# Patient Record
Sex: Male | Born: 1960 | Race: Black or African American | Hispanic: No | Marital: Single | Smoking: Never smoker
Health system: Southern US, Community
[De-identification: ages and names within clinical notes are randomized; demographics above are authoritative.]

## PROBLEM LIST (undated history)

## (undated) ENCOUNTER — Telehealth

## (undated) ENCOUNTER — Ambulatory Visit

## (undated) ENCOUNTER — Encounter: Attending: Nephrology | Primary: Nephrology

## (undated) ENCOUNTER — Encounter

## (undated) ENCOUNTER — Ambulatory Visit: Payer: Medicare (Managed Care) | Attending: Medical | Primary: Medical

## (undated) ENCOUNTER — Ambulatory Visit: Payer: PRIVATE HEALTH INSURANCE

## (undated) ENCOUNTER — Inpatient Hospital Stay

## (undated) ENCOUNTER — Ambulatory Visit: Payer: PRIVATE HEALTH INSURANCE | Attending: Adult Health | Primary: Adult Health

## (undated) ENCOUNTER — Encounter
Attending: Student in an Organized Health Care Education/Training Program | Primary: Student in an Organized Health Care Education/Training Program

## (undated) ENCOUNTER — Ambulatory Visit: Attending: Nephrology | Primary: Nephrology

## (undated) ENCOUNTER — Telehealth: Attending: Nephrology | Primary: Nephrology

## (undated) ENCOUNTER — Telehealth: Attending: Internal Medicine | Primary: Internal Medicine

## (undated) ENCOUNTER — Encounter: Payer: MEDICARE | Attending: Gastroenterology | Primary: Gastroenterology

## (undated) ENCOUNTER — Encounter: Payer: PRIVATE HEALTH INSURANCE | Attending: Medical | Primary: Medical

## (undated) ENCOUNTER — Ambulatory Visit: Payer: MEDICARE

## (undated) ENCOUNTER — Ambulatory Visit: Payer: MEDICARE | Attending: Nephrology | Primary: Nephrology

## (undated) ENCOUNTER — Encounter: Payer: MEDICARE | Attending: Adult Health | Primary: Adult Health

## (undated) ENCOUNTER — Encounter: Attending: Family Medicine | Primary: Family Medicine

## (undated) ENCOUNTER — Ambulatory Visit
Payer: MEDICARE | Attending: Student in an Organized Health Care Education/Training Program | Primary: Student in an Organized Health Care Education/Training Program

## (undated) ENCOUNTER — Telehealth: Attending: Family | Primary: Family

## (undated) ENCOUNTER — Encounter: Payer: MEDICARE | Attending: Family | Primary: Family

## (undated) DIAGNOSIS — Z9109 Other allergy status, other than to drugs and biological substances: Secondary | ICD-10-CM

## (undated) DIAGNOSIS — I10 Essential (primary) hypertension: Secondary | ICD-10-CM

## (undated) DIAGNOSIS — N186 End stage renal disease: Secondary | ICD-10-CM

## (undated) DIAGNOSIS — B353 Tinea pedis: Secondary | ICD-10-CM

## (undated) DIAGNOSIS — N189 Chronic kidney disease, unspecified: Secondary | ICD-10-CM

## (undated) DIAGNOSIS — L219 Seborrheic dermatitis, unspecified: Secondary | ICD-10-CM

## (undated) DIAGNOSIS — M109 Gout, unspecified: Secondary | ICD-10-CM

## (undated) DIAGNOSIS — B20 Human immunodeficiency virus [HIV] disease: Secondary | ICD-10-CM

## (undated) HISTORY — DX: End stage renal disease: N18.6

## (undated) HISTORY — DX: Human immunodeficiency virus (HIV) disease: B20

## (undated) HISTORY — PX: HERNIA REPAIR: SHX51

## (undated) HISTORY — DX: Seborrheic dermatitis, unspecified: L21.9

## (undated) HISTORY — DX: Tinea pedis: B35.3

## (undated) SURGERY — DIALYSIS/PERMA CATHETER INSERTION
Anesthesia: Moderate Sedation | Laterality: Right

## (undated) MED ORDER — HYDRALAZINE 50 MG TABLET: Freq: Three times a day (TID) | ORAL | 0.00000 days | Status: SS

## (undated) MED ORDER — SPIRONOLACTONE 25 MG TABLET: Freq: Every day | ORAL | 0 days | Status: SS

## (undated) MED ORDER — ATENOLOL 100 MG TABLET: Freq: Every day | ORAL | 0 days | Status: SS

## (undated) MED ORDER — DOXAZOSIN 2 MG TABLET: Freq: Two times a day (BID) | ORAL | 0 days | Status: SS

---

## 1995-12-15 DIAGNOSIS — B20 Human immunodeficiency virus [HIV] disease: Secondary | ICD-10-CM

## 1995-12-15 DIAGNOSIS — Z21 Asymptomatic human immunodeficiency virus [HIV] infection status: Secondary | ICD-10-CM

## 1995-12-15 HISTORY — DX: Asymptomatic human immunodeficiency virus (hiv) infection status: Z21

## 1995-12-15 HISTORY — DX: Human immunodeficiency virus (HIV) disease: B20

## 1996-01-02 ENCOUNTER — Encounter (INDEPENDENT_AMBULATORY_CARE_PROVIDER_SITE_OTHER): Payer: Self-pay | Admitting: *Deleted

## 1996-01-02 DIAGNOSIS — B20 Human immunodeficiency virus [HIV] disease: Secondary | ICD-10-CM | POA: Insufficient documentation

## 1996-01-02 LAB — CONVERTED CEMR LAB
CD4 Count: 460 microliters
CD4 T Cell Abs: 460

## 1998-01-06 ENCOUNTER — Encounter: Admission: RE | Admit: 1998-01-06 | Discharge: 1998-01-06 | Payer: Self-pay | Admitting: Infectious Diseases

## 1998-02-03 ENCOUNTER — Encounter: Admission: RE | Admit: 1998-02-03 | Discharge: 1998-02-03 | Payer: Self-pay | Admitting: Infectious Diseases

## 2002-09-02 ENCOUNTER — Encounter: Admission: RE | Admit: 2002-09-02 | Discharge: 2002-09-02 | Payer: Self-pay | Admitting: Infectious Diseases

## 2002-09-02 ENCOUNTER — Ambulatory Visit (HOSPITAL_COMMUNITY): Admission: RE | Admit: 2002-09-02 | Discharge: 2002-09-02 | Payer: Self-pay | Admitting: Infectious Diseases

## 2002-09-22 ENCOUNTER — Encounter: Admission: RE | Admit: 2002-09-22 | Discharge: 2002-09-22 | Payer: Self-pay | Admitting: Infectious Diseases

## 2004-05-25 ENCOUNTER — Ambulatory Visit: Payer: Self-pay | Admitting: Infectious Diseases

## 2004-05-25 ENCOUNTER — Encounter (INDEPENDENT_AMBULATORY_CARE_PROVIDER_SITE_OTHER): Payer: Self-pay | Admitting: *Deleted

## 2004-05-25 ENCOUNTER — Ambulatory Visit (HOSPITAL_COMMUNITY): Admission: RE | Admit: 2004-05-25 | Discharge: 2004-05-25 | Payer: Self-pay | Admitting: Infectious Diseases

## 2004-05-25 LAB — CONVERTED CEMR LAB
CD4 Count: 250 microliters
HIV 1 RNA Quant: 1960 copies/mL

## 2004-06-22 ENCOUNTER — Ambulatory Visit: Payer: Self-pay | Admitting: Infectious Diseases

## 2004-06-23 DIAGNOSIS — L219 Seborrheic dermatitis, unspecified: Secondary | ICD-10-CM

## 2004-06-23 DIAGNOSIS — B353 Tinea pedis: Secondary | ICD-10-CM

## 2004-06-23 HISTORY — DX: Tinea pedis: B35.3

## 2004-06-23 HISTORY — DX: Seborrheic dermatitis, unspecified: L21.9

## 2005-04-26 ENCOUNTER — Encounter (INDEPENDENT_AMBULATORY_CARE_PROVIDER_SITE_OTHER): Payer: Self-pay | Admitting: *Deleted

## 2005-04-26 ENCOUNTER — Encounter: Admission: RE | Admit: 2005-04-26 | Discharge: 2005-04-26 | Payer: Self-pay | Admitting: Infectious Diseases

## 2005-04-26 ENCOUNTER — Ambulatory Visit: Payer: Self-pay | Admitting: Infectious Diseases

## 2005-04-26 LAB — CONVERTED CEMR LAB
CD4 Count: 370 microliters
HIV 1 RNA Quant: 5020 copies/mL

## 2006-04-09 ENCOUNTER — Encounter (INDEPENDENT_AMBULATORY_CARE_PROVIDER_SITE_OTHER): Payer: Self-pay | Admitting: *Deleted

## 2006-04-09 LAB — CONVERTED CEMR LAB

## 2006-04-22 ENCOUNTER — Encounter (INDEPENDENT_AMBULATORY_CARE_PROVIDER_SITE_OTHER): Payer: Self-pay | Admitting: *Deleted

## 2007-03-05 ENCOUNTER — Other Ambulatory Visit: Payer: Self-pay

## 2007-03-05 ENCOUNTER — Ambulatory Visit: Payer: Self-pay | Admitting: General Surgery

## 2007-03-08 ENCOUNTER — Ambulatory Visit: Payer: Self-pay | Admitting: General Surgery

## 2008-05-11 ENCOUNTER — Ambulatory Visit: Payer: Self-pay | Admitting: Family Medicine

## 2010-03-21 ENCOUNTER — Ambulatory Visit: Payer: Self-pay | Admitting: Family Medicine

## 2010-03-31 ENCOUNTER — Ambulatory Visit: Payer: Self-pay | Admitting: Family Medicine

## 2010-04-25 ENCOUNTER — Ambulatory Visit: Payer: Self-pay | Admitting: Anesthesiology

## 2010-04-25 DIAGNOSIS — I10 Essential (primary) hypertension: Secondary | ICD-10-CM

## 2010-04-26 ENCOUNTER — Ambulatory Visit: Payer: Self-pay | Admitting: General Surgery

## 2010-04-27 LAB — PATHOLOGY REPORT

## 2010-05-26 DIAGNOSIS — B20 Human immunodeficiency virus [HIV] disease: Secondary | ICD-10-CM

## 2010-05-26 DIAGNOSIS — Z0000 Encounter for general adult medical examination without abnormal findings: Secondary | ICD-10-CM | POA: Insufficient documentation

## 2010-06-09 ENCOUNTER — Other Ambulatory Visit (INDEPENDENT_AMBULATORY_CARE_PROVIDER_SITE_OTHER): Payer: BC Managed Care – PPO

## 2010-06-09 DIAGNOSIS — Z79899 Other long term (current) drug therapy: Secondary | ICD-10-CM

## 2010-06-09 DIAGNOSIS — Z0000 Encounter for general adult medical examination without abnormal findings: Secondary | ICD-10-CM

## 2010-06-09 DIAGNOSIS — B20 Human immunodeficiency virus [HIV] disease: Secondary | ICD-10-CM

## 2010-06-09 DIAGNOSIS — Z113 Encounter for screening for infections with a predominantly sexual mode of transmission: Secondary | ICD-10-CM | POA: Insufficient documentation

## 2010-06-09 LAB — CBC WITH DIFFERENTIAL/PLATELET
Basophils Absolute: 0 10*3/uL (ref 0.0–0.1)
Basophils Relative: 0 % (ref 0–1)
Eosinophils Absolute: 0.1 10*3/uL (ref 0.0–0.7)
Eosinophils Relative: 1 % (ref 0–5)
HCT: 37.9 % — ABNORMAL LOW (ref 39.0–52.0)
Hemoglobin: 12.9 g/dL — ABNORMAL LOW (ref 13.0–17.0)
Lymphocytes Relative: 35 % (ref 12–46)
Lymphs Abs: 1.7 10*3/uL (ref 0.7–4.0)
MCH: 31.9 pg (ref 26.0–34.0)
MCHC: 34 g/dL (ref 30.0–36.0)
MCV: 93.6 fL (ref 78.0–100.0)
Monocytes Absolute: 0.6 10*3/uL (ref 0.1–1.0)
Monocytes Relative: 11 % (ref 3–12)
Neutro Abs: 2.5 10*3/uL (ref 1.7–7.7)
Neutrophils Relative %: 52 % (ref 43–77)
Platelets: 285 10*3/uL (ref 150–400)
RBC: 4.05 MIL/uL — ABNORMAL LOW (ref 4.22–5.81)
RDW: 16.1 % — ABNORMAL HIGH (ref 11.5–15.5)
WBC: 4.9 10*3/uL (ref 4.0–10.5)

## 2010-06-09 LAB — RPR

## 2010-06-09 LAB — COMPREHENSIVE METABOLIC PANEL
ALT: 13 U/L (ref 0–53)
AST: 29 U/L (ref 0–37)
Albumin: 3.9 g/dL (ref 3.5–5.2)
Alkaline Phosphatase: 68 U/L (ref 39–117)
BUN: 21 mg/dL (ref 6–23)
CO2: 25 mEq/L (ref 19–32)
Calcium: 9.6 mg/dL (ref 8.4–10.5)
Chloride: 104 mEq/L (ref 96–112)
Creat: 1.18 mg/dL (ref 0.40–1.50)
Glucose, Bld: 84 mg/dL (ref 70–99)
Potassium: 3.9 mEq/L (ref 3.5–5.3)
Sodium: 139 mEq/L (ref 135–145)
Total Bilirubin: 0.4 mg/dL (ref 0.3–1.2)
Total Protein: 8.6 g/dL — ABNORMAL HIGH (ref 6.0–8.3)

## 2010-06-09 LAB — LIPID PANEL
Cholesterol: 186 mg/dL (ref 0–200)
HDL: 55 mg/dL (ref >39)
LDL Cholesterol: 117 mg/dL — ABNORMAL HIGH (ref 0–99)
Total CHOL/HDL Ratio: 3.4 Ratio
Triglycerides: 69 mg/dL (ref <150)
VLDL: 14 mg/dL (ref 0–40)

## 2010-06-09 NOTE — Progress Notes (Signed)
Addended by: Lorne Skeens on: 06/09/2010 10:59 AM   Modules accepted: Orders

## 2010-06-10 LAB — URINALYSIS, ROUTINE W REFLEX MICROSCOPIC
Bilirubin Urine: NEGATIVE
Glucose, UA: NEGATIVE mg/dL
Ketones, ur: NEGATIVE mg/dL
Leukocytes, UA: NEGATIVE
Nitrite: NEGATIVE
Protein, ur: 30 mg/dL — AB
Specific Gravity, Urine: 1.017 (ref 1.005–1.030)
Urobilinogen, UA: 0.2 mg/dL (ref 0.0–1.0)
pH: 5 (ref 5.0–8.0)

## 2010-06-10 LAB — URINALYSIS, MICROSCOPIC ONLY
Bacteria, UA: NONE SEEN
Casts: NONE SEEN
Crystals: NONE SEEN
Squamous Epithelial / LPF: NONE SEEN

## 2010-06-10 LAB — GC/CHLAMYDIA PROBE AMP, URINE
Chlamydia, Swab/Urine, PCR: NEGATIVE
GC Probe Amp, Urine: NEGATIVE

## 2010-06-10 LAB — T-HELPER CELL (CD4) - (RCID CLINIC ONLY)
CD4 % Helper T Cell: 24 % — ABNORMAL LOW (ref 33–55)
CD4 T Cell Abs: 410 uL (ref 400–2700)

## 2010-06-15 LAB — HIV-1 RNA ULTRAQUANT REFLEX TO GENTYP+
HIV 1 RNA Quant: 3710 copies/mL — ABNORMAL HIGH (ref <20)
HIV-1 RNA Quant, Log: 3.57 {Log} — ABNORMAL HIGH (ref <1.30)

## 2010-06-20 LAB — HIV-1 GENOTYPR PLUS

## 2010-06-23 ENCOUNTER — Ambulatory Visit (INDEPENDENT_AMBULATORY_CARE_PROVIDER_SITE_OTHER): Payer: BC Managed Care – PPO | Admitting: Adult Health

## 2010-06-23 ENCOUNTER — Encounter: Payer: Self-pay | Admitting: Adult Health

## 2010-06-23 DIAGNOSIS — B20 Human immunodeficiency virus [HIV] disease: Secondary | ICD-10-CM

## 2010-06-23 DIAGNOSIS — L719 Rosacea, unspecified: Secondary | ICD-10-CM

## 2010-06-23 DIAGNOSIS — I10 Essential (primary) hypertension: Secondary | ICD-10-CM

## 2010-06-23 DIAGNOSIS — I120 Hypertensive chronic kidney disease with stage 5 chronic kidney disease or end stage renal disease: Secondary | ICD-10-CM | POA: Insufficient documentation

## 2010-06-23 NOTE — Progress Notes (Signed)
Subjective:    Patient ID: Louis [name], male    DOB: 04-22-1960, 50 y.o.   MRN: [id number]  HPI 50 year old, African American male presents to clinic for evaluation and management. Not seen in clinic since 2007. He has a history of stable HIV, with a low viral load and has been treatment nave since his original diagnosis. His most recent health problems involve hypertension, for which he is on 2 blood pressure medications and rosacea to his face, for which he takes doxycycline, as needed. He does not recall at medication or the dosages of his antihypertensives, who states he will call us with those when he gets on. He denies any treatment for HIV-associated problems, and he has not seen a. Medical provider for his HIV since 2007. He has had a right inguinal hernia repair in March 0000000, with no complications. He denies any constitutional symptoms and states he has been in his normal state of good health.    Review of Systems  Constitutional: Negative for fever, chills, diaphoresis, activity change, appetite change, fatigue and unexpected weight change.  HENT: Negative for hearing loss, ear pain, nosebleeds, congestion, facial swelling, rhinorrhea, sneezing, neck pain, neck stiffness, postnasal drip, tinnitus and ear discharge.   Eyes: Negative for photophobia, pain, discharge, redness, itching and visual disturbance.  Respiratory: Negative for apnea, cough, choking, chest tightness, shortness of breath, wheezing and stridor.   Cardiovascular: Negative for chest pain, palpitations and leg swelling.  Gastrointestinal: Negative for nausea, vomiting, abdominal pain, diarrhea, constipation, blood in stool, abdominal distention, anal bleeding and rectal pain.  Genitourinary: Negative for dysuria, urgency, frequency, hematuria, flank pain, decreased urine volume, discharge, penile swelling, scrotal swelling, enuresis, difficulty urinating, genital sores, penile pain and testicular pain.    Musculoskeletal: Negative for myalgias, back pain, joint swelling, arthralgias and gait problem.  Skin: Negative for color change, pallor, rash and wound.  Neurological: Negative for dizziness, tremors, seizures, syncope, facial asymmetry, speech difficulty, weakness, light-headedness, numbness and headaches.  Hematological: Negative for adenopathy. Does not bruise/bleed easily.  Psychiatric/Behavioral: Negative for suicidal ideas, hallucinations, behavioral problems, confusion, sleep disturbance, self-injury, dysphoric mood, decreased concentration and agitation. The patient is not nervous/anxious and is not hyperactive.        Objective:   Physical Exam  Constitutional: He is oriented to person, place, and time. He appears well-developed and well-nourished. No distress.  HENT:  Head: Normocephalic and atraumatic.  Right Ear: External ear normal.  Left Ear: External ear normal.  Nose: Nose normal.  Mouth/Throat: Oropharynx is clear and moist. No oropharyngeal exudate.  Eyes: Conjunctivae and EOM are normal. Pupils are equal, round, and reactive to light. Right eye exhibits no discharge. Left eye exhibits no discharge. No scleral icterus.  Neck: Normal range of motion. Neck supple. No JVD present. No tracheal deviation present. No thyromegaly present.  Cardiovascular: Normal rate, regular rhythm, normal heart sounds and intact distal pulses.  Exam reveals no gallop and no friction rub.   No murmur heard. Pulmonary/Chest: Effort normal and breath sounds normal. No stridor. No respiratory distress. He has no wheezes. He has no rales. He exhibits no tenderness.  Abdominal: Soft. Bowel sounds are normal. He exhibits no distension and no mass. There is no tenderness. There is no rebound and no guarding.       Postoperative her surgical incision sites to both left and right inguinal regions intact and healed.  Genitourinary: Penis normal.  Musculoskeletal: Normal range of motion. He exhibits no  edema and no tenderness.  Lymphadenopathy:  He has no cervical adenopathy.  Neurological: He is alert and oriented to person, place, and time. No cranial nerve deficit. He exhibits normal muscle tone. Coordination normal.  Skin: Skin is warm and dry. Rash noted. There is erythema. No pallor.       Rosacea-like rash noted to face, in a butterfly pattern around the nasal labial folds, and bilateral cheeks.  Psychiatric: His behavior is normal. Judgment and thought content normal.       Affect is blunted          Assessment & Plan:  1. HIV. His CD4 count from 06/09/2010 was 410 at 48% with a viral load of 3710 copies per mL. We spent an extensive period of time discussing HIV pathogenesis, treatment strategies, medication effects, regimen, options, and treatment goals. We discussed in detail 3 specific regimens, which include Atripla or Complera or Isentress with Truvada. Drug effects, regimen, limitations, side effects, and potential adverse events, and toxicities were discussed in detail. Questions were answered with verbal acknowledgment of all information provided. At present, he is requesting "a little time"to prepare himself for treatment. He did state that when he is ready to start treatment, he will use Complera as his treatment choice. He states he will call us back by the first of next week to let us know when he is ready. Our plan will be to schedule labs in 4 weeks after he starts his treatment, and followup with [place] in 6 weeks.  2. Hypertension. States he only took one of his antihypertensives today and knows he needs to have his other medication on board as well. He also states he will call us with the names and doses of the medications. He is taking. We recommend that should his blood pressure, not be under control. He followup with his primary care doctor for medication modification. Verbally acknowledged this and agreed with plan.

## 2011-01-12 ENCOUNTER — Encounter: Payer: Self-pay | Admitting: *Deleted

## 2011-07-18 ENCOUNTER — Telehealth: Payer: Self-pay | Admitting: *Deleted

## 2011-07-18 NOTE — Telephone Encounter (Signed)
I spoke with him about the need to be seen. I transferred him to [facility] to make an appt

## 2011-08-08 ENCOUNTER — Other Ambulatory Visit: Payer: Self-pay | Admitting: Internal Medicine

## 2011-08-08 DIAGNOSIS — Z79899 Other long term (current) drug therapy: Secondary | ICD-10-CM

## 2011-08-08 DIAGNOSIS — B20 Human immunodeficiency virus [HIV] disease: Secondary | ICD-10-CM

## 2011-08-08 DIAGNOSIS — Z113 Encounter for screening for infections with a predominantly sexual mode of transmission: Secondary | ICD-10-CM

## 2011-08-09 ENCOUNTER — Other Ambulatory Visit (INDEPENDENT_AMBULATORY_CARE_PROVIDER_SITE_OTHER): Payer: BC Managed Care – PPO

## 2011-08-09 DIAGNOSIS — Z79899 Other long term (current) drug therapy: Secondary | ICD-10-CM

## 2011-08-09 DIAGNOSIS — B20 Human immunodeficiency virus [HIV] disease: Secondary | ICD-10-CM

## 2011-08-09 DIAGNOSIS — Z113 Encounter for screening for infections with a predominantly sexual mode of transmission: Secondary | ICD-10-CM

## 2011-08-10 LAB — CBC WITH DIFFERENTIAL/PLATELET
Basophils Absolute: 0 10*3/uL (ref 0.0–0.1)
Basophils Relative: 0 % (ref 0–1)
Eosinophils Absolute: 0.1 10*3/uL (ref 0.0–0.7)
Eosinophils Relative: 1 % (ref 0–5)
HCT: 36.9 % — ABNORMAL LOW (ref 39.0–52.0)
Hemoglobin: 12.2 g/dL — ABNORMAL LOW (ref 13.0–17.0)
Lymphocytes Relative: 22 % (ref 12–46)
Lymphs Abs: 1.1 10*3/uL (ref 0.7–4.0)
MCH: 34.3 pg — ABNORMAL HIGH (ref 26.0–34.0)
MCHC: 33.1 g/dL (ref 30.0–36.0)
MCV: 103.7 fL — ABNORMAL HIGH (ref 78.0–100.0)
Monocytes Absolute: 0.7 10*3/uL (ref 0.1–1.0)
Monocytes Relative: 15 % — ABNORMAL HIGH (ref 3–12)
Neutro Abs: 3.1 10*3/uL (ref 1.7–7.7)
Neutrophils Relative %: 62 % (ref 43–77)
Platelets: 235 10*3/uL (ref 150–400)
RBC: 3.56 MIL/uL — ABNORMAL LOW (ref 4.22–5.81)
RDW: 15.1 % (ref 11.5–15.5)
WBC: 5 10*3/uL (ref 4.0–10.5)

## 2011-08-10 LAB — COMPREHENSIVE METABOLIC PANEL
ALT: 40 U/L (ref 0–53)
AST: 62 U/L — ABNORMAL HIGH (ref 0–37)
Albumin: 3.7 g/dL (ref 3.5–5.2)
Alkaline Phosphatase: 60 U/L (ref 39–117)
BUN: 19 mg/dL (ref 6–23)
CO2: 26 mEq/L (ref 19–32)
Calcium: 9.2 mg/dL (ref 8.4–10.5)
Chloride: 107 mEq/L (ref 96–112)
Creat: 1.4 mg/dL — ABNORMAL HIGH (ref 0.50–1.35)
Glucose, Bld: 89 mg/dL (ref 70–99)
Potassium: 5 mEq/L (ref 3.5–5.3)
Sodium: 141 mEq/L (ref 135–145)
Total Bilirubin: 0.4 mg/dL (ref 0.3–1.2)
Total Protein: 8.2 g/dL (ref 6.0–8.3)

## 2011-08-10 LAB — LIPID PANEL
Cholesterol: 179 mg/dL (ref 0–200)
HDL: 54 mg/dL (ref >39)
LDL Cholesterol: 107 mg/dL — ABNORMAL HIGH (ref 0–99)
Total CHOL/HDL Ratio: 3.3 Ratio
Triglycerides: 89 mg/dL (ref <150)
VLDL: 18 mg/dL (ref 0–40)

## 2011-08-10 LAB — T-HELPER CELL (CD4) - (RCID CLINIC ONLY)
CD4 % Helper T Cell: 25 % — ABNORMAL LOW (ref 33–55)
CD4 T Cell Abs: 330 uL — ABNORMAL LOW (ref 400–2700)

## 2011-08-10 LAB — RPR

## 2011-08-11 LAB — HIV-1 RNA QUANT-NO REFLEX-BLD
HIV 1 RNA Quant: 15109 copies/mL — ABNORMAL HIGH (ref <20)
HIV-1 RNA Quant, Log: 4.18 {Log} — ABNORMAL HIGH (ref <1.30)

## 2011-08-23 ENCOUNTER — Ambulatory Visit: Payer: BC Managed Care – PPO | Admitting: Internal Medicine

## 2011-09-11 ENCOUNTER — Ambulatory Visit (INDEPENDENT_AMBULATORY_CARE_PROVIDER_SITE_OTHER): Payer: BC Managed Care – PPO | Admitting: Internal Medicine

## 2011-09-11 DIAGNOSIS — B20 Human immunodeficiency virus [HIV] disease: Secondary | ICD-10-CM

## 2011-11-13 NOTE — Assessment & Plan Note (Signed)
No show

## 2011-11-13 NOTE — Progress Notes (Signed)
Patient ID: Louis [name], male   DOB: 1960-03-24, 51 y.o.   MRN: [id number]  No show

## 2012-01-03 ENCOUNTER — Other Ambulatory Visit: Payer: BC Managed Care – PPO

## 2012-01-03 DIAGNOSIS — B20 Human immunodeficiency virus [HIV] disease: Secondary | ICD-10-CM

## 2012-01-04 LAB — COMPLETE METABOLIC PANEL WITH GFR
ALT: 21 U/L (ref 0–53)
AST: 50 U/L — ABNORMAL HIGH (ref 0–37)
Albumin: 3.7 g/dL (ref 3.5–5.2)
Alkaline Phosphatase: 74 U/L (ref 39–117)
BUN: 23 mg/dL (ref 6–23)
CO2: 25 mEq/L (ref 19–32)
Calcium: 8.7 mg/dL (ref 8.4–10.5)
Chloride: 108 mEq/L (ref 96–112)
Creat: 1.5 mg/dL — ABNORMAL HIGH (ref 0.50–1.35)
GFR, Est African American: 62 mL/min
GFR, Est Non African American: 54 mL/min — ABNORMAL LOW
Glucose, Bld: 88 mg/dL (ref 70–99)
Potassium: 4.7 mEq/L (ref 3.5–5.3)
Sodium: 142 mEq/L (ref 135–145)
Total Bilirubin: 0.7 mg/dL (ref 0.3–1.2)
Total Protein: 8.1 g/dL (ref 6.0–8.3)

## 2012-01-04 LAB — CBC WITH DIFFERENTIAL/PLATELET
Basophils Absolute: 0 10*3/uL (ref 0.0–0.1)
Basophils Relative: 0 % (ref 0–1)
Eosinophils Absolute: 0 10*3/uL (ref 0.0–0.7)
Eosinophils Relative: 1 % (ref 0–5)
HCT: 37.6 % — ABNORMAL LOW (ref 39.0–52.0)
Hemoglobin: 12.5 g/dL — ABNORMAL LOW (ref 13.0–17.0)
Lymphocytes Relative: 25 % (ref 12–46)
Lymphs Abs: 1.4 10*3/uL (ref 0.7–4.0)
MCH: 35 pg — ABNORMAL HIGH (ref 26.0–34.0)
MCHC: 33.2 g/dL (ref 30.0–36.0)
MCV: 105.3 fL — ABNORMAL HIGH (ref 78.0–100.0)
Monocytes Absolute: 0.5 10*3/uL (ref 0.1–1.0)
Monocytes Relative: 9 % (ref 3–12)
Neutro Abs: 3.5 10*3/uL (ref 1.7–7.7)
Neutrophils Relative %: 65 % (ref 43–77)
Platelets: 256 10*3/uL (ref 150–400)
RBC: 3.57 MIL/uL — ABNORMAL LOW (ref 4.22–5.81)
RDW: 14 % (ref 11.5–15.5)
WBC: 5.4 10*3/uL (ref 4.0–10.5)

## 2012-01-04 LAB — HIV-1 RNA QUANT-NO REFLEX-BLD
HIV 1 RNA Quant: 15522 copies/mL — ABNORMAL HIGH (ref <20)
HIV-1 RNA Quant, Log: 4.19 {Log} — ABNORMAL HIGH (ref <1.30)

## 2012-01-04 LAB — T-HELPER CELL (CD4) - (RCID CLINIC ONLY)
CD4 % Helper T Cell: 19 % — ABNORMAL LOW (ref 33–55)
CD4 T Cell Abs: 230 uL — ABNORMAL LOW (ref 400–2700)

## 2012-01-17 ENCOUNTER — Ambulatory Visit: Payer: BC Managed Care – PPO | Admitting: Internal Medicine

## 2012-01-17 ENCOUNTER — Telehealth: Payer: Self-pay | Admitting: *Deleted

## 2012-01-17 NOTE — Telephone Encounter (Signed)
RN called patient to remind of appointment this afternoon. He apologized for missing it, saying that he had been at the doctor this morning for a broken nose.  He rescheduled for 12/10 at 3:45pm Louis [name]

## 2012-01-23 ENCOUNTER — Other Ambulatory Visit: Payer: Self-pay | Admitting: *Deleted

## 2012-01-23 ENCOUNTER — Encounter: Payer: Self-pay | Admitting: Internal Medicine

## 2012-01-23 ENCOUNTER — Ambulatory Visit (INDEPENDENT_AMBULATORY_CARE_PROVIDER_SITE_OTHER): Payer: BC Managed Care – PPO | Admitting: Internal Medicine

## 2012-01-23 VITALS — BP 148/88 | HR 85 | Temp 97.7°F | Ht 74.5 in | Wt 213.0 lb

## 2012-01-23 DIAGNOSIS — B20 Human immunodeficiency virus [HIV] disease: Secondary | ICD-10-CM

## 2012-01-23 MED ORDER — ABACAVIR SULFATE-LAMIVUDINE 600-300 MG PO TABS: 1.0000 | ORAL_TABLET | Freq: Every day | ORAL | Status: DC

## 2012-01-23 MED ORDER — DARUNAVIR ETHANOLATE 800 MG PO TABS: 800.0000 mg | ORAL_TABLET | Freq: Every day | ORAL | Status: DC

## 2012-01-23 MED ORDER — RITONAVIR 100 MG PO TABS: 100.0000 mg | ORAL_TABLET | Freq: Every day | ORAL | Status: DC

## 2012-01-23 NOTE — Assessment & Plan Note (Signed)
Because of his renal insufficiency, I have discussed with him and opted to start him on Epzicom with Prezista and Norvir. He is going to start this now and return in one month for labs and I will see him 2 weeks after his labs. I also going to get an HLA test today since this has not been done in the past to assure no risk of hypersensitivity reaction. If this is positive, he will need to stop. However I opts not to wait to start his medications since he has had such poor followup and his CD4 is down to 230.

## 2012-01-23 NOTE — Progress Notes (Signed)
  Subjective:    Patient ID: Louis [name], male    DOB: Oct 09, 1960, 51 y.o.   MRN: [id number]  HPI He comes in for consideration of therapy for his HIV. He has very sporadic followup and in fact was last seen about 2 years ago and prior to that was about 5 years ago. He has had discussions about starting medication however he tends to not return. A however, he feels he is ready to start medication and understands the importance of medication. He does have high blood pressure and unfortunately his creatinine has been noted to be elevated on several occasions. He has no complaints though including no urinary discomfort or issues. He feels well otherwise. He is concerned about side effects of medications.   Review of Systems  Constitutional: Negative for fever and fatigue.  HENT: Negative for sore throat and trouble swallowing.   Respiratory: Negative for cough and shortness of breath.   Cardiovascular: Negative for chest pain, palpitations and leg swelling.  Gastrointestinal: Negative for nausea, abdominal pain and diarrhea.  Musculoskeletal: Negative for myalgias, joint swelling and arthralgias.  Skin:       Rosacea on his face  Neurological: Negative for dizziness and headaches.       Objective:   Physical Exam  Constitutional: He appears well-developed and well-nourished. No distress.  Cardiovascular: Normal rate, regular rhythm and normal heart sounds.  Exam reveals no gallop and no friction rub.   No murmur heard. Pulmonary/Chest: Effort normal and breath sounds normal. No respiratory distress. He has no wheezes. He has no rales.  Lymphadenopathy:    He has no cervical adenopathy.  Skin: Rash noted.       Notable rosacea on his face          Assessment & Plan:

## 2012-01-24 ENCOUNTER — Other Ambulatory Visit: Payer: Self-pay | Admitting: *Deleted

## 2012-01-24 DIAGNOSIS — B20 Human immunodeficiency virus [HIV] disease: Secondary | ICD-10-CM

## 2012-01-24 MED ORDER — RITONAVIR 100 MG PO TABS: 100.0000 mg | ORAL_TABLET | Freq: Every day | ORAL | Status: DC

## 2012-01-24 MED ORDER — ABACAVIR SULFATE-LAMIVUDINE 600-300 MG PO TABS: 1.0000 | ORAL_TABLET | Freq: Every day | ORAL | Status: DC

## 2012-01-24 MED ORDER — DARUNAVIR ETHANOLATE 800 MG PO TABS: 800.0000 mg | ORAL_TABLET | Freq: Every day | ORAL | Status: DC

## 2012-01-24 NOTE — Telephone Encounter (Signed)
Patient called and advised he was not able to get his medications filled. He advised we sent them to [facility] in [place] and was to go to [facility]. Advised him that should not have been an issue as if they were in the system they could have [facility]. Called the pharmacy and was advised that they were not able to fill the medications because his insurance company requires him to have them mail-order. She further advised they are not able to transfer them to the mail-order, we will have to send them. Advised to send to [place] and gave [phone] as the contact number to give the patient when he calls to set up delivery.   Called patient gave him the information and contact number. Advised him to call and activate the co-pay cards and call and give them the information and to set up delivery. Advised him that if he has any other questions or problems to call the office.

## 2012-01-31 LAB — HLA B*5701: HLA-B*5701: NEGATIVE

## 2012-02-09 ENCOUNTER — Other Ambulatory Visit: Payer: Self-pay | Admitting: *Deleted

## 2012-02-19 ENCOUNTER — Telehealth: Payer: Self-pay | Admitting: *Deleted

## 2012-02-19 NOTE — Telephone Encounter (Signed)
Patient is having difficulty obtaining his HIV meds. Called [facility] [phone] and there is a problem with the copay cards. They advised him to call and request to speak with an insurance specialist. I told him if there is still an issue to call in the AM and make an appt with Jasmine December our medication assistance person and she can activate the cards for him. Myrtis Hopping CMA

## 2012-02-22 ENCOUNTER — Telehealth: Payer: Self-pay | Admitting: Internal Medicine

## 2012-02-22 NOTE — Telephone Encounter (Signed)
Returned Mr. Nolting call.  He still has not gotten his medication.  I asked if he had activated his co-pay cards.  He said he had and the only one he could use was for the Epzicom.  I called the different co-pay companies.  The only one activated was for Epzicom.  I activated the cards for Prezista and Norvir.  I called [facility] and gave them the numbers on the cards.  If everything is ok, Mr. Catania should get his medication by Monday.

## 2012-02-27 ENCOUNTER — Ambulatory Visit: Payer: Self-pay | Admitting: Family Medicine

## 2012-03-06 ENCOUNTER — Telehealth: Payer: Self-pay | Admitting: Licensed Clinical Social Worker

## 2012-03-06 ENCOUNTER — Other Ambulatory Visit: Payer: Self-pay | Admitting: Licensed Clinical Social Worker

## 2012-03-06 DIAGNOSIS — B20 Human immunodeficiency virus [HIV] disease: Secondary | ICD-10-CM

## 2012-03-06 MED ORDER — DARUNAVIR ETHANOLATE 800 MG PO TABS: 800.0000 mg | ORAL_TABLET | Freq: Every day | ORAL | Status: DC

## 2012-03-06 MED ORDER — ABACAVIR SULFATE-LAMIVUDINE 600-300 MG PO TABS: 1.0000 | ORAL_TABLET | Freq: Every day | ORAL | Status: DC

## 2012-03-06 MED ORDER — RITONAVIR 100 MG PO TABS: 100.0000 mg | ORAL_TABLET | Freq: Every day | ORAL | Status: DC

## 2012-03-06 NOTE — Telephone Encounter (Signed)
Patient called stating that he did not receive his medications by mail, he gave [place] [facility] initially. Jasmine December called his [employer] [employer] and they said we should be sending his prescriptions to [facility]. ([facility]) I called the patient, and I asked him to call back if he hasn't received his medications by the end of the week.

## 2012-04-01 ENCOUNTER — Ambulatory Visit: Payer: Self-pay | Admitting: Family Medicine

## 2012-04-11 ENCOUNTER — Ambulatory Visit: Payer: Self-pay

## 2012-04-26 ENCOUNTER — Ambulatory Visit: Payer: Self-pay

## 2012-06-27 ENCOUNTER — Other Ambulatory Visit: Payer: Self-pay | Admitting: Infectious Disease

## 2012-06-27 ENCOUNTER — Other Ambulatory Visit (INDEPENDENT_AMBULATORY_CARE_PROVIDER_SITE_OTHER): Payer: BC Managed Care – PPO

## 2012-06-27 DIAGNOSIS — B20 Human immunodeficiency virus [HIV] disease: Secondary | ICD-10-CM

## 2012-06-27 LAB — CBC WITH DIFFERENTIAL/PLATELET
Basophils Absolute: 0 10*3/uL (ref 0.0–0.1)
Basophils Relative: 0 % (ref 0–1)
Eosinophils Absolute: 0 10*3/uL (ref 0.0–0.7)
Eosinophils Relative: 1 % (ref 0–5)
HCT: 38.4 % — ABNORMAL LOW (ref 39.0–52.0)
Hemoglobin: 12.8 g/dL — ABNORMAL LOW (ref 13.0–17.0)
Lymphocytes Relative: 23 % (ref 12–46)
Lymphs Abs: 1.4 10*3/uL (ref 0.7–4.0)
MCH: 37.2 pg — ABNORMAL HIGH (ref 26.0–34.0)
MCHC: 33.3 g/dL (ref 30.0–36.0)
MCV: 111.6 fL — ABNORMAL HIGH (ref 78.0–100.0)
Monocytes Absolute: 0.5 10*3/uL (ref 0.1–1.0)
Monocytes Relative: 8 % (ref 3–12)
Neutro Abs: 4.2 10*3/uL (ref 1.7–7.7)
Neutrophils Relative %: 68 % (ref 43–77)
Platelets: 241 10*3/uL (ref 150–400)
RBC: 3.44 MIL/uL — ABNORMAL LOW (ref 4.22–5.81)
RDW: 16.1 % — ABNORMAL HIGH (ref 11.5–15.5)
WBC: 6 10*3/uL (ref 4.0–10.5)

## 2012-06-27 LAB — COMPLETE METABOLIC PANEL WITH GFR
ALT: 8 U/L (ref 0–53)
AST: 20 U/L (ref 0–37)
Albumin: 3.7 g/dL (ref 3.5–5.2)
Alkaline Phosphatase: 48 U/L (ref 39–117)
BUN: 33 mg/dL — ABNORMAL HIGH (ref 6–23)
CO2: 18 mEq/L — ABNORMAL LOW (ref 19–32)
Calcium: 9.2 mg/dL (ref 8.4–10.5)
Chloride: 107 mEq/L (ref 96–112)
Creat: 1.77 mg/dL — ABNORMAL HIGH (ref 0.50–1.35)
GFR, Est African American: 51 mL/min — ABNORMAL LOW
GFR, Est Non African American: 44 mL/min — ABNORMAL LOW
Glucose, Bld: 70 mg/dL (ref 70–99)
Potassium: 4.8 mEq/L (ref 3.5–5.3)
Sodium: 139 mEq/L (ref 135–145)
Total Bilirubin: 0.5 mg/dL (ref 0.3–1.2)
Total Protein: 7.4 g/dL (ref 6.0–8.3)

## 2012-06-28 LAB — T-HELPER CELL (CD4) - (RCID CLINIC ONLY)
CD4 % Helper T Cell: 26 % — ABNORMAL LOW (ref 33–55)
CD4 T Cell Abs: 410 uL (ref 400–2700)

## 2012-06-28 LAB — HIV-1 RNA QUANT-NO REFLEX-BLD
HIV 1 RNA Quant: <20 copies/mL (ref <20)
HIV-1 RNA Quant, Log: <1.3 {Log} (ref <1.30)

## 2012-07-11 ENCOUNTER — Ambulatory Visit (INDEPENDENT_AMBULATORY_CARE_PROVIDER_SITE_OTHER): Payer: BC Managed Care – PPO | Admitting: Internal Medicine

## 2012-07-11 ENCOUNTER — Other Ambulatory Visit: Payer: Self-pay | Admitting: *Deleted

## 2012-07-11 ENCOUNTER — Encounter: Payer: Self-pay | Admitting: Internal Medicine

## 2012-07-11 VITALS — BP 104/67 | HR 77 | Temp 98.3°F | Ht 74.5 in | Wt 197.0 lb

## 2012-07-11 DIAGNOSIS — R634 Abnormal weight loss: Secondary | ICD-10-CM

## 2012-07-11 DIAGNOSIS — B20 Human immunodeficiency virus [HIV] disease: Secondary | ICD-10-CM

## 2012-07-11 DIAGNOSIS — I10 Essential (primary) hypertension: Secondary | ICD-10-CM

## 2012-07-11 DIAGNOSIS — Z23 Encounter for immunization: Secondary | ICD-10-CM

## 2012-07-11 NOTE — Addendum Note (Signed)
Addended by: Myrtis Hopping A on: 07/11/2012 09:57 AM   Modules accepted: Orders

## 2012-07-11 NOTE — Assessment & Plan Note (Signed)
Likely transient with suppression of virus.  Will recheck weight in 2 months.  No diarrhea, just less intake.

## 2012-07-11 NOTE — Progress Notes (Signed)
  Subjective:    Patient ID: Louis [name], male    DOB: 1960/10/29, 52 y.o.   MRN: [id number]  HPI Comes in for follow up of HIV.  After sporadic follow up, he started on Prezista, norvir and Epzicom.  He has some renal insufficiency.  He started about 3 months ago though missed his follow up appt after starting.  His labs though show undetectable vl now and CD 4 over 400 now.  He still feels fatigued and has less of an appetite with a 15 lb weight loss.     Review of Systems  Constitutional: Positive for fatigue and unexpected weight change.  HENT: Negative for sore throat and trouble swallowing.   Gastrointestinal: Negative for diarrhea.  Skin: Negative for rash.  Neurological: Negative for dizziness and headaches.       Objective:   Physical Exam  Constitutional: He appears well-developed and well-nourished. No distress.  HENT:  Mouth/Throat: Oropharynx is clear and moist. No oropharyngeal exudate.  Cardiovascular: Normal rate and regular rhythm.  Exam reveals no gallop and no friction rub.   No murmur heard. Pulmonary/Chest: Effort normal and breath sounds normal. No respiratory distress. He has no wheezes.  Lymphadenopathy:    He has no cervical adenopathy.          Assessment & Plan:

## 2012-07-11 NOTE — Assessment & Plan Note (Signed)
stable °

## 2012-07-11 NOTE — Assessment & Plan Note (Signed)
Doing well, though is about to run out.  Pharmacy called and to send but he may miss a day or two.   Recheck in 2 months.

## 2012-07-12 ENCOUNTER — Other Ambulatory Visit: Payer: Self-pay | Admitting: *Deleted

## 2012-08-12 ENCOUNTER — Ambulatory Visit: Payer: BC Managed Care – PPO

## 2012-09-10 ENCOUNTER — Other Ambulatory Visit: Payer: BC Managed Care – PPO

## 2012-09-20 ENCOUNTER — Ambulatory Visit: Payer: Self-pay | Admitting: Family Medicine

## 2012-09-24 ENCOUNTER — Ambulatory Visit: Payer: BC Managed Care – PPO | Admitting: Internal Medicine

## 2012-11-07 ENCOUNTER — Ambulatory Visit (INDEPENDENT_AMBULATORY_CARE_PROVIDER_SITE_OTHER): Payer: BC Managed Care – PPO | Admitting: Internal Medicine

## 2012-11-07 ENCOUNTER — Encounter: Payer: Self-pay | Admitting: Internal Medicine

## 2012-11-07 VITALS — BP 156/103 | HR 67 | Temp 98.7°F | Ht 74.0 in | Wt 211.0 lb

## 2012-11-07 DIAGNOSIS — N289 Disorder of kidney and ureter, unspecified: Secondary | ICD-10-CM

## 2012-11-07 DIAGNOSIS — Z23 Encounter for immunization: Secondary | ICD-10-CM

## 2012-11-07 DIAGNOSIS — B20 Human immunodeficiency virus [HIV] disease: Secondary | ICD-10-CM

## 2012-11-07 NOTE — Progress Notes (Signed)
  Subjective:    Patient ID: Louis [name], male    DOB: 1961/02/04, 52 y.o.   MRN: [id number]  HPI  He comes or followup of his HIV. He was started on his current regimen of persistent, Norvir and Epzicom in May.  He has some renal insufficiency and so Truvada was avoided. He denies any missed doses. He feels well and has had no side effects from the medications. He has not had any recent viral load or CD4 count. He did see his primary doctor yesterday who apparently checked his renal function and is stable. No weight loss or diarrhea.   Review of Systems  Constitutional: Negative for fever and fatigue.  HENT: Negative for sore throat and trouble swallowing.   Respiratory: Negative for cough and shortness of breath.   Cardiovascular: Negative for chest pain, palpitations and leg swelling.  Gastrointestinal: Negative for nausea, abdominal pain and diarrhea.  Musculoskeletal: Negative for myalgias, joint swelling and arthralgias.  Skin:       Rosacea on his face  Neurological: Negative for dizziness and headaches.       Objective:   Physical Exam  Constitutional: He appears well-developed and well-nourished. No distress.  Cardiovascular: Normal rate, regular rhythm and normal heart sounds.  Exam reveals no gallop and no friction rub.   No murmur heard. Pulmonary/Chest: Effort normal and breath sounds normal. No respiratory distress. He has no wheezes. He has no rales.  Lymphadenopathy:    He has no cervical adenopathy.  Skin: Rash noted.  Notable rosacea on his face          Assessment & Plan:

## 2012-11-07 NOTE — Assessment & Plan Note (Addendum)
He did have his creatinine monitored by his primary physician. As long as his GFR is over 50 no dose changes will be necessary.  I will try to get a copy

## 2012-11-07 NOTE — Addendum Note (Signed)
Addended by: Landis Gandy on: 11/07/2012 11:20 AM   Modules accepted: Orders

## 2012-11-07 NOTE — Assessment & Plan Note (Signed)
I will check his labs today and if reassuring he can return in 4 months.

## 2012-11-08 LAB — HIV-1 RNA QUANT-NO REFLEX-BLD
HIV 1 RNA Quant: 21 copies/mL (ref <20)
HIV-1 RNA Quant, Log: 1.32 {Log} — ABNORMAL HIGH (ref <1.30)

## 2012-11-08 LAB — T-HELPER CELL (CD4) - (RCID CLINIC ONLY)
CD4 % Helper T Cell: 26 % — ABNORMAL LOW (ref 33–55)
CD4 T Cell Abs: 390 /uL — ABNORMAL LOW (ref 400–2700)

## 2012-11-28 ENCOUNTER — Ambulatory Visit: Payer: BC Managed Care – PPO | Admitting: Internal Medicine

## 2013-01-31 ENCOUNTER — Other Ambulatory Visit: Payer: Self-pay | Admitting: *Deleted

## 2013-01-31 DIAGNOSIS — B20 Human immunodeficiency virus [HIV] disease: Secondary | ICD-10-CM

## 2013-01-31 MED ORDER — DARUNAVIR ETHANOLATE 800 MG PO TABS: 800.0000 mg | ORAL_TABLET | Freq: Every day | ORAL | Status: DC

## 2013-01-31 MED ORDER — ABACAVIR SULFATE-LAMIVUDINE 600-300 MG PO TABS: 1.0000 | ORAL_TABLET | Freq: Every day | ORAL | Status: DC

## 2013-01-31 MED ORDER — RITONAVIR 100 MG PO TABS: 100.0000 mg | ORAL_TABLET | Freq: Every day | ORAL | Status: DC

## 2013-03-13 ENCOUNTER — Encounter: Payer: Self-pay | Admitting: Internal Medicine

## 2013-03-13 ENCOUNTER — Other Ambulatory Visit: Payer: Self-pay | Admitting: *Deleted

## 2013-03-13 ENCOUNTER — Ambulatory Visit (INDEPENDENT_AMBULATORY_CARE_PROVIDER_SITE_OTHER): Payer: BC Managed Care – PPO | Admitting: Internal Medicine

## 2013-03-13 VITALS — BP 148/101 | HR 72 | Temp 98.3°F | Ht 74.5 in | Wt 207.0 lb

## 2013-03-13 DIAGNOSIS — Z23 Encounter for immunization: Secondary | ICD-10-CM

## 2013-03-13 DIAGNOSIS — N289 Disorder of kidney and ureter, unspecified: Secondary | ICD-10-CM

## 2013-03-13 DIAGNOSIS — B20 Human immunodeficiency virus [HIV] disease: Secondary | ICD-10-CM

## 2013-03-13 DIAGNOSIS — R634 Abnormal weight loss: Secondary | ICD-10-CM

## 2013-03-13 LAB — BASIC METABOLIC PANEL WITH GFR
BUN: 16 mg/dL (ref 6–23)
CO2: 26 mEq/L (ref 19–32)
Calcium: 8.3 mg/dL — ABNORMAL LOW (ref 8.4–10.5)
Chloride: 106 mEq/L (ref 96–112)
Creat: 1.29 mg/dL (ref 0.50–1.35)
GFR, Est African American: 74 mL/min
GFR, Est Non African American: 64 mL/min
Glucose, Bld: 75 mg/dL (ref 70–99)
Potassium: 4.6 mEq/L (ref 3.5–5.3)
Sodium: 140 mEq/L (ref 135–145)

## 2013-03-13 NOTE — Progress Notes (Signed)
  Subjective:    Patient ID: Louis [name], male    DOB: 1960/04/15, 53 y.o.   MRN: [id number]  HPI  He comes or followup of his HIV. He was started on his current regimen of Prezista, Norvir and Epzicom last May.  He has some renal insufficiency and so Truvada was avoided. He denies any missed doses. He feels well and has had no side effects from the medications. He has not had any recent viral load or CD4 count.  No weight loss or diarrhea.  Sees his PCP for his BP and other issues.     Review of Systems  Constitutional: Negative for fever and fatigue.  HENT: Negative for sore throat and trouble swallowing.   Respiratory: Negative for cough and shortness of breath.   Cardiovascular: Negative for chest pain, palpitations and leg swelling.  Gastrointestinal: Negative for nausea, abdominal pain and diarrhea.  Musculoskeletal: Negative for arthralgias, joint swelling and myalgias.  Skin:       Rosacea on his face  Neurological: Negative for dizziness and headaches.       Objective:   Physical Exam  Constitutional: He appears well-developed and well-nourished. No distress.  Cardiovascular: Normal rate, regular rhythm and normal heart sounds.  Exam reveals no gallop and no friction rub.   No murmur heard. Pulmonary/Chest: Effort normal and breath sounds normal. No respiratory distress. He has no wheezes. He has no rales.  Lymphadenopathy:    He has no cervical adenopathy.  Skin: Rash noted.  Notable rosacea on his face          Assessment & Plan:

## 2013-03-13 NOTE — Assessment & Plan Note (Signed)
I will check his creatinine today.

## 2013-03-13 NOTE — Assessment & Plan Note (Signed)
He seems to be doing well. I will check his labs today. He will return in 4 months unless an issue arises on his labs are

## 2013-03-13 NOTE — Assessment & Plan Note (Signed)
His weight is now stable on his HIV medications.

## 2013-03-14 LAB — T-HELPER CELL (CD4) - (RCID CLINIC ONLY)
CD4 % Helper T Cell: 32 % — ABNORMAL LOW (ref 33–55)
CD4 T Cell Abs: 410 /uL (ref 400–2700)

## 2013-03-16 LAB — HIV-1 RNA QUANT-NO REFLEX-BLD
HIV 1 RNA Quant: <20 copies/mL (ref <20)
HIV-1 RNA Quant, Log: <1.3 {Log} (ref <1.30)

## 2013-07-10 ENCOUNTER — Ambulatory Visit: Payer: BC Managed Care – PPO | Admitting: Internal Medicine

## 2013-09-14 ENCOUNTER — Emergency Department: Payer: Self-pay | Admitting: Emergency Medicine

## 2013-09-16 ENCOUNTER — Telehealth: Payer: Self-pay | Admitting: *Deleted

## 2013-09-16 NOTE — Telephone Encounter (Signed)
Pt left message stating that he has switched insurance companies, is using a new mail order pharmacy. Per message, patient will have a gap in his medications.  He wants to know if a 5-10 day gap is too much.  RN called patient back, left message asking him to call [facility]. Landis Gandy, RN

## 2013-09-24 ENCOUNTER — Encounter: Payer: Self-pay | Admitting: *Deleted

## 2013-10-02 ENCOUNTER — Other Ambulatory Visit: Payer: Self-pay | Admitting: Licensed Clinical Social Worker

## 2013-10-08 NOTE — Telephone Encounter (Signed)
Error

## 2013-11-11 ENCOUNTER — Telehealth: Payer: Self-pay | Admitting: *Deleted

## 2013-11-11 NOTE — Telephone Encounter (Signed)
Patient was having difficulty with his pharmacy ([facility]) accepting copay cards.  Patient was being charged $2000/month for medications.  RN called pharmacy, activated copay cards and gave information to pharmacy.  Per assistant, they have been submitted and patient will have an answer in 24 hours if the cards are accepted.  Patient will call tomorrow morning for the answer and to set up delivery. He will let us know if they have been accepted. Patient has been off medication for 1 month due to these issues. He will need follow up scheduled. Landis Gandy, RN

## 2013-12-05 ENCOUNTER — Telehealth: Payer: Self-pay | Admitting: *Deleted

## 2013-12-05 NOTE — Telephone Encounter (Signed)
Patient left message he was having difficulty filling Norvir, states he needs more money for this.  Pt was given/activiated copay cards.  Will cc Jasmine December for possible patient assistance. Left message asking patient to call back and give [place] more information. Landis Gandy, RN

## 2013-12-10 ENCOUNTER — Telehealth: Payer: Self-pay | Admitting: *Deleted

## 2013-12-10 NOTE — Telephone Encounter (Signed)
Called and left Louis [name] a message concerning his medication.

## 2013-12-17 ENCOUNTER — Telehealth: Payer: Self-pay | Admitting: *Deleted

## 2013-12-17 NOTE — Telephone Encounter (Signed)
Called the [employer] and got [place] enrolled.  He is effective until 12-17-2014.  I called Nizar and gave him the information.

## 2014-07-15 ENCOUNTER — Other Ambulatory Visit: Payer: Self-pay | Admitting: Family Medicine

## 2014-07-16 ENCOUNTER — Other Ambulatory Visit: Payer: Self-pay | Admitting: Family Medicine

## 2014-07-16 NOTE — Telephone Encounter (Signed)
RTO BEFORE NEXT RF

## 2014-07-16 NOTE — Telephone Encounter (Signed)
Pt has not been seen recently

## 2014-08-12 ENCOUNTER — Other Ambulatory Visit: Payer: Self-pay | Admitting: Family Medicine

## 2014-08-29 ENCOUNTER — Other Ambulatory Visit: Payer: Self-pay | Admitting: Family Medicine

## 2014-09-30 ENCOUNTER — Other Ambulatory Visit: Payer: Self-pay | Admitting: Family Medicine

## 2014-11-06 ENCOUNTER — Other Ambulatory Visit: Payer: Self-pay | Admitting: Family Medicine

## 2014-12-31 ENCOUNTER — Other Ambulatory Visit: Payer: Self-pay | Admitting: Family Medicine

## 2015-01-04 ENCOUNTER — Ambulatory Visit: Payer: Self-pay | Admitting: Family Medicine

## 2015-01-05 ENCOUNTER — Encounter: Payer: Self-pay | Admitting: Family Medicine

## 2015-01-05 ENCOUNTER — Ambulatory Visit (INDEPENDENT_AMBULATORY_CARE_PROVIDER_SITE_OTHER): Payer: Self-pay | Admitting: Family Medicine

## 2015-01-05 VITALS — BP 150/102 | HR 94 | Temp 98.0°F | Resp 18 | Ht 74.0 in | Wt 204.4 lb

## 2015-01-05 DIAGNOSIS — I10 Essential (primary) hypertension: Secondary | ICD-10-CM

## 2015-01-05 DIAGNOSIS — M1000 Idiopathic gout, unspecified site: Secondary | ICD-10-CM

## 2015-01-05 DIAGNOSIS — B20 Human immunodeficiency virus [HIV] disease: Secondary | ICD-10-CM

## 2015-01-05 DIAGNOSIS — L719 Rosacea, unspecified: Secondary | ICD-10-CM

## 2015-01-05 MED ORDER — COLCHICINE 0.6 MG PO TABS: 0.6000 mg | ORAL_TABLET | Freq: Two times a day (BID) | ORAL | Status: DC

## 2015-01-05 MED ORDER — DOXYCYCLINE HYCLATE 100 MG PO CAPS: 100.0000 mg | ORAL_CAPSULE | Freq: Two times a day (BID) | ORAL | Status: DC

## 2015-01-05 MED ORDER — ATENOLOL 50 MG PO TABS: 50.0000 mg | ORAL_TABLET | Freq: Two times a day (BID) | ORAL | Status: DC

## 2015-01-05 MED ORDER — HYDROCHLOROTHIAZIDE 25 MG PO TABS: 25.0000 mg | ORAL_TABLET | Freq: Every day | ORAL | Status: DC

## 2015-01-05 NOTE — Progress Notes (Signed)
Name: Louis [name]   MRN: [id number]    DOB: 1960/11/21   Date:01/05/2015       Progress Note  Subjective  Chief Complaint  Chief Complaint  Patient presents with  . Hypertension    no insurance been without meds for 1 month  . Gout    Hypertension Pertinent negatives include no blurred vision, chest pain, headaches, neck pain, orthopnea, palpitations or shortness of breath.  Hypertension   Patient presents for follow-up of hypertension. It has been present for over 5 years.  Patient states that there is compliance with medical regimen which consists of hydrochlorothiazide 25 mg daily amlodipine 5 mg daily atenolol 50 mg daily . There is no end organ disease. Cardiac risk factors include hypertension hyperlipidemia and diabetes.  Exercise regimen consist of some walking .  Diet consist of salt restriction  Gout   Patient has a history of gout for greater than 5 years. Marland Kitchen He is currently on a regimen of colchicine 0.6 mg twice a day and     Past Medical History  Diagnosis Date  . Seborrhea 06/23/2004  . Dermatophytosis of foot 06/23/2004  . HIV infection ([place]) 12/1995    Social History  Substance Use Topics  . Smoking status: Never Smoker   . Smokeless tobacco: Never Used  . Alcohol Use: 1.2 oz/week    2 Standard drinks or equivalent per week     Current outpatient prescriptions:  .  atenolol (TENORMIN) 50 MG tablet, TAKE 1 TABLET BY MOUTH TWICE A DAY, Disp: 60 tablet, Rfl: 0 .  colchicine 0.6 MG tablet, Take 0.6 mg by mouth 2 (two) times daily., Disp: , Rfl:  .  doxycycline (VIBRAMYCIN) 100 MG capsule, TAKE 1 CAPSULE TWICE DAILY, Disp: 60 capsule, Rfl: 0 .  hydrochlorothiazide (HYDRODIURIL) 25 MG tablet, Take 25 mg by mouth daily., Disp: , Rfl: 4 .  valACYclovir (VALTREX) 500 MG tablet, TAKE 1 TABLET EVERY DAY, Disp: 30 tablet, Rfl: 2 .  abacavir-lamiVUDine (EPZICOM) 600-300 MG per tablet, Take 1 tablet by mouth daily., Disp: 30 tablet, Rfl: 11 .  amLODipine  (NORVASC) 5 MG tablet, , Disp: , Rfl:  .  cyclobenzaprine (FLEXERIL) 10 MG tablet, , Disp: , Rfl:  .  Darunavir Ethanolate (PREZISTA) 800 MG tablet, Take 1 tablet (800 mg total) by mouth daily with breakfast., Disp: 30 tablet, Rfl: 11 .  desloratadine (CLARINEX) 5 MG tablet, , Disp: , Rfl:  .  doxycycline (ADOXA) 50 MG tablet, Take 50 mg by mouth 3 (three) times daily. dermatologist, Disp: , Rfl:  .  montelukast (SINGULAIR) 10 MG tablet, , Disp: , Rfl:  .  predniSONE (DELTASONE) 20 MG tablet, Take 20 mg by mouth daily., Disp: , Rfl:  .  ritonavir (NORVIR) 100 MG TABS tablet, Take 1 tablet (100 mg total) by mouth daily., Disp: 30 tablet, Rfl: 11  Allergies  Allergen Reactions  . Quinapril Hcl Swelling    Face swells    Review of Systems  Constitutional: Negative for fever, chills and weight loss.  HENT: Negative for congestion, hearing loss, sore throat and tinnitus.   Eyes: Negative for blurred vision, double vision and redness.  Respiratory: Negative for cough, hemoptysis and shortness of breath.   Cardiovascular: Negative for chest pain, palpitations, orthopnea, claudication and leg swelling.  Gastrointestinal: Negative for heartburn, nausea, vomiting, diarrhea, constipation and blood in stool.  Genitourinary: Negative for dysuria, urgency, frequency and hematuria.  Musculoskeletal: Negative for myalgias, back pain, joint pain, falls and neck pain.  Skin: Negative for itching.  Neurological: Negative for dizziness, tingling, tremors, focal weakness, seizures, loss of consciousness, weakness and headaches.  Endo/Heme/Allergies: Does not bruise/bleed easily.  Psychiatric/Behavioral: Negative for depression and substance abuse. The patient is not nervous/anxious and does not have insomnia.      Objective  Filed Vitals:   01/05/15 1409  BP: 150/102  Pulse: 94  Temp: 98 F (36.7 C)  TempSrc: Oral  Resp: 18  Height: 6\' 2"  (1.88 m)  Weight: 204 lb 6.4 oz (92.715 kg)  SpO2: 97%      Physical Exam  Constitutional: He is oriented to person, place, and time and well-developed, well-nourished, and in no distress.  HENT:  Head: Normocephalic.  Eyes: EOM are normal. Pupils are equal, round, and reactive to light.  Neck: Normal range of motion. Neck supple. No thyromegaly present.  Cardiovascular: Normal rate, regular rhythm and normal heart sounds.   No murmur heard. Pulmonary/Chest: Effort normal and breath sounds normal. No respiratory distress. He has no wheezes.  Abdominal: Soft. Bowel sounds are normal.  Musculoskeletal: Normal range of motion. He exhibits no edema.  Lymphadenopathy:    He has no cervical adenopathy.  Neurological: He is alert and oriented to person, place, and time. No cranial nerve deficit. Gait normal. Coordination normal.  Skin: Skin is warm and dry. No rash noted.  Psychiatric: Affect and judgment normal.      Assessment & Plan  1. Essential hypertension Out of control with patient out of medication for several months - atenolol (TENORMIN) 50 MG tablet; Take 1 tablet (50 mg total) by mouth 2 (two) times daily.  Dispense: 60 tablet; Refill: 5 - hydrochlorothiazide (HYDRODIURIL) 25 MG tablet; Take 1 tablet (25 mg total) by mouth daily.  Dispense: 30 tablet; Refill: 5  2. Rosacea Slightly worse off meds - doxycycline (VIBRAMYCIN) 100 MG capsule; Take 1 capsule (100 mg total) by mouth 2 (two) times daily.  Dispense: 30 capsule; Refill: 5  3. Human immunodeficiency virus (HIV) disease ([place]) Per ID  4. Idiopathic gout, unspecified chronicity, unspecified site Renew colchicine - colchicine 0.6 MG tablet; Take 1 tablet (0.6 mg total) by mouth 2 (two) times daily.  Dispense: 60 tablet; Refill: 5

## 2015-01-13 ENCOUNTER — Telehealth: Payer: Self-pay | Admitting: Family Medicine

## 2015-01-13 MED ORDER — CYCLOBENZAPRINE HCL 10 MG PO TABS: 10.0000 mg | ORAL_TABLET | Freq: Every day | ORAL | Status: DC

## 2015-01-13 NOTE — Telephone Encounter (Signed)
Patient requesting refill on Cyclobenzaprine. Please send to [facility]

## 2015-01-26 ENCOUNTER — Ambulatory Visit: Payer: Self-pay

## 2015-07-06 ENCOUNTER — Ambulatory Visit: Payer: Self-pay | Admitting: Family Medicine

## 2015-07-14 ENCOUNTER — Other Ambulatory Visit: Payer: Self-pay | Admitting: Emergency Medicine

## 2015-07-14 MED ORDER — VALACYCLOVIR HCL 500 MG PO TABS: 500.0000 mg | ORAL_TABLET | Freq: Every day | ORAL | Status: DC

## 2015-08-26 ENCOUNTER — Other Ambulatory Visit: Payer: Self-pay | Admitting: Family Medicine

## 2015-08-29 ENCOUNTER — Emergency Department: Payer: Self-pay

## 2015-08-29 ENCOUNTER — Emergency Department
Admission: EM | Admit: 2015-08-29 | Discharge: 2015-08-29 | Disposition: A | Discharge status: Home / Self Care | Payer: Self-pay | Attending: Emergency Medicine | Admitting: Emergency Medicine

## 2015-08-29 ENCOUNTER — Encounter: Payer: Self-pay | Admitting: Emergency Medicine

## 2015-08-29 DIAGNOSIS — W19XXXA Unspecified fall, initial encounter: Secondary | ICD-10-CM | POA: Insufficient documentation

## 2015-08-29 DIAGNOSIS — Z792 Long term (current) use of antibiotics: Secondary | ICD-10-CM | POA: Insufficient documentation

## 2015-08-29 DIAGNOSIS — R079 Chest pain, unspecified: Secondary | ICD-10-CM

## 2015-08-29 DIAGNOSIS — I10 Essential (primary) hypertension: Secondary | ICD-10-CM | POA: Insufficient documentation

## 2015-08-29 DIAGNOSIS — Z79899 Other long term (current) drug therapy: Secondary | ICD-10-CM | POA: Insufficient documentation

## 2015-08-29 DIAGNOSIS — Y999 Unspecified external cause status: Secondary | ICD-10-CM | POA: Insufficient documentation

## 2015-08-29 DIAGNOSIS — Y939 Activity, unspecified: Secondary | ICD-10-CM | POA: Insufficient documentation

## 2015-08-29 DIAGNOSIS — J189 Pneumonia, unspecified organism: Secondary | ICD-10-CM | POA: Insufficient documentation

## 2015-08-29 DIAGNOSIS — B20 Human immunodeficiency virus [HIV] disease: Secondary | ICD-10-CM | POA: Insufficient documentation

## 2015-08-29 DIAGNOSIS — S27329A Contusion of lung, unspecified, initial encounter: Secondary | ICD-10-CM

## 2015-08-29 DIAGNOSIS — Z7952 Long term (current) use of systemic steroids: Secondary | ICD-10-CM | POA: Insufficient documentation

## 2015-08-29 DIAGNOSIS — S27321A Contusion of lung, unilateral, initial encounter: Secondary | ICD-10-CM | POA: Insufficient documentation

## 2015-08-29 DIAGNOSIS — Y929 Unspecified place or not applicable: Secondary | ICD-10-CM | POA: Insufficient documentation

## 2015-08-29 LAB — CBC
HCT: 42 % (ref 40.0–52.0)
Hemoglobin: 14.2 g/dL (ref 13.0–18.0)
MCH: 35.5 pg — ABNORMAL HIGH (ref 26.0–34.0)
MCHC: 33.8 g/dL (ref 32.0–36.0)
MCV: 105.1 fL — ABNORMAL HIGH (ref 80.0–100.0)
Platelets: 160 10*3/uL (ref 150–440)
RBC: 4 MIL/uL — ABNORMAL LOW (ref 4.40–5.90)
RDW: 14.3 % (ref 11.5–14.5)
WBC: 6.9 10*3/uL (ref 3.8–10.6)

## 2015-08-29 LAB — BASIC METABOLIC PANEL
Anion gap: 7 (ref 5–15)
BUN: 18 mg/dL (ref 6–20)
CO2: 22 mmol/L (ref 22–32)
Calcium: 8.7 mg/dL — ABNORMAL LOW (ref 8.9–10.3)
Chloride: 107 mmol/L (ref 101–111)
Creatinine, Ser: 1.5 mg/dL — ABNORMAL HIGH (ref 0.61–1.24)
GFR calc Af Amer: 60 mL/min — ABNORMAL LOW (ref >60)
GFR calc non Af Amer: 51 mL/min — ABNORMAL LOW (ref >60)
Glucose, Bld: 93 mg/dL (ref 65–99)
Potassium: 4 mmol/L (ref 3.5–5.1)
Sodium: 136 mmol/L (ref 135–145)

## 2015-08-29 LAB — TROPONIN I: Troponin I: <0.03 ng/mL (ref <0.03)

## 2015-08-29 MED ORDER — OXYCODONE-ACETAMINOPHEN 5-325 MG PO TABS: 1.0000 | ORAL_TABLET | Freq: Four times a day (QID) | ORAL | Status: DC | PRN

## 2015-08-29 MED ORDER — LEVOFLOXACIN 750 MG PO TABS: 750.0000 mg | ORAL_TABLET | Freq: Every day | ORAL | Status: AC

## 2015-08-29 NOTE — Discharge Instructions (Signed)
You have been seen in the emergency department today for chest pain. Your x-rays consistent with either pulmonary contusion/bruise versus pneumonia. Please take your antibiotics as prescribed their entire course. Please take your pain medication as prescribed, as needed. As we discussed please use your provided incentive spirometer 3 times per hour while awake for the next 7 days. Return to the emergency department for any worsening pain, fever, or any other symptom personally concerning to your self.   Chest Wall Pain Chest wall pain is pain in or around the bones and muscles of your chest. Sometimes, an injury causes this pain. Sometimes, the cause may not be known. This pain may take several weeks or longer to get better. HOME CARE INSTRUCTIONS  Pay attention to any changes in your symptoms. Take these actions to help with your pain:   Rest as told by your health care provider.   Avoid activities that cause pain. These include any activities that use your chest muscles or your abdominal and side muscles to lift heavy items.   If directed, apply ice to the painful area:  Put ice in a plastic bag.  Place a towel between your skin and the bag.  Leave the ice on for 20 minutes, 2-3 times per day.  Take over-the-counter and prescription medicines only as told by your health care provider.  Do not use tobacco products, including cigarettes, chewing tobacco, and e-cigarettes. If you need help quitting, ask your health care provider.  Keep all follow-up visits as told by your health care provider. This is important. SEEK MEDICAL CARE IF:  You have a fever.  Your chest pain becomes worse.  You have new symptoms. SEEK IMMEDIATE MEDICAL CARE IF:  You have nausea or vomiting.  You feel sweaty or light-headed.  You have a cough with phlegm (sputum) or you cough up blood.  You develop shortness of breath.   This information is not intended to replace advice given to you by your  health care provider. Make sure you discuss any questions you have with your health care provider.   Document Released: 01/30/2005 Document Revised: 10/21/2014 Document Reviewed: 04/27/2014 Elsevier Interactive Patient Education 2016 [place] Pneumonia, Adult Pneumonia is an infection of the lungs. One type of pneumonia can happen while a person is in a hospital. A different type can happen when a person is not in a hospital (community-acquired pneumonia). It is easy for this kind to spread from person to person. It can spread to you if you breathe near an infected person who coughs or sneezes. Some symptoms include:  A dry cough.  A wet (productive) cough.  Fever.  Sweating.  Chest pain. HOME CARE  Take over-the-counter and prescription medicines only as told by your doctor.  Only take cough medicine if you are losing sleep.  If you were prescribed an antibiotic medicine, take it as told by your doctor. Do not stop taking the antibiotic even if you start to feel better.  Sleep with your head and neck raised (elevated). You can do this by putting a few pillows under your head, or you can sleep in a recliner.  Do not use tobacco products. These include cigarettes, chewing tobacco, and e-cigarettes. If you need help quitting, ask your doctor.  Drink enough water to keep your pee (urine) clear or pale yellow. A shot (vaccine) can help prevent pneumonia. Shots are often suggested for:  People older than 53 years of age.  People older than 53 years  of age:  Who are having cancer treatment.  Who have long-term (chronic) lung disease.  Who have problems with their body's defense system (immune system). You may also prevent pneumonia if you take these actions:  Get the flu (influenza) shot every year.  Go to the dentist as often as told.  Wash your hands often. If soap and water are not available, use hand sanitizer. GET HELP IF:  You have a  fever.  You lose sleep because your cough medicine does not help. GET HELP RIGHT AWAY IF:  You are short of breath and it gets worse.  You have more chest pain.  Your sickness gets worse. This is very serious if:  You are an older adult.  Your body's defense system is weak.  You cough up blood.   This information is not intended to replace advice given to you by your health care provider. Make sure you discuss any questions you have with your health care provider.   Document Released: 07/19/2007 Document Revised: 10/21/2014 Document Reviewed: 05/27/2014 Elsevier Interactive Patient Education 2016 [employer]  Pulmonary Contusion A pulmonary contusion is a deep bruise to the lung. The bruise causes the lung tissue to swell and bleed into the surrounding area. The lungs will not work right if this happens. You may find it hard to breathe because you are not getting enough oxygen.  HOME CARE  Do not take aspirin for the first few days. Take over-the-counter and prescription medicines only as told by your doctor.  Do your deep breathing exercises as told.  Return to your normal activities as told by your doctor. Talk with your doctor about:  What activities are safe for you.  When you can return to driving, work, school, and sports.  Keep all follow-up visits as told by your doctor. This is important. GET HELP IF:  You have shaking chills.  You cough up thick spit (mucus). GET HELP RIGHT AWAY IF:  You have trouble breathing, and it gets worse.  Your chest pain gets worse.  You cough up blood.  You make high-pitched whistling sounds when you breathe (wheeze).  Your lips or nails look blue.  You feel dizzy or you feel like you may pass out (faint).  You feel confused.  You have a fever.   This information is not intended to replace advice given to you by your health care provider. Make sure you discuss any questions you have with your health care provider.    Document Released: 01/19/2011 Document Revised: 10/21/2014 Document Reviewed: 03/09/2014 Elsevier Interactive Patient Education [employer].

## 2015-08-29 NOTE — ED Provider Notes (Signed)
[facility] Emergency Department Provider Note  Time seen: 5:55 PM  I have reviewed the triage vital signs and the nursing notes.   HISTORY  Chief Complaint Chest Pain and Shortness of Breath    HPI Louis [name] is a 55 y.o. male with a past medical history of HIV who presents the emergency right and central chest pain with movement and inspiration or coughing. According to the patient he fell 3 days ago, since that time he has experienced progressively worsening chest pain especially to the right and central chest. States the pain is worse with any type with inspiration cough or movement. Patient denies any fever. Denies any significant cough, denies any sputum production. Patient states bruising to the right chest. Describes the pain as mild dull constant pain, moderate to significant sharp pain with movement deep inspiration or coughing.     Past Medical History  Diagnosis Date  . Seborrhea 06/23/2004  . Dermatophytosis of foot 06/23/2004  . HIV infection ([place]) 12/1995    Patient Active Problem List   Diagnosis Date Noted  . Gout 01/05/2015  . Renal insufficiency 11/07/2012  . Hypertension 06/23/2010  . Rosacea 06/23/2010  . Encounter for long-term (current) use of other medications 06/09/2010  . Screening examination for venereal disease 06/09/2010  . Encounter for preventive health examination   . Human immunodeficiency virus (HIV) disease ([place]) 01/02/1996    Past Surgical History  Procedure Laterality Date  . Hernia repair      Current Outpatient Rx  Name  Route  Sig  Dispense  Refill  . abacavir-lamiVUDine (EPZICOM) 600-300 MG per tablet   Oral   Take 1 tablet by mouth daily.   30 tablet   11   . amLODipine (NORVASC) 5 MG tablet               . atenolol (TENORMIN) 50 MG tablet   Oral   Take 1 tablet (50 mg total) by mouth 2 (two) times daily.   60 tablet   5   . colchicine 0.6 MG tablet   Oral   Take 1 tablet (0.6 mg  total) by mouth 2 (two) times daily.   60 tablet   5   . cyclobenzaprine (FLEXERIL) 10 MG tablet   Oral   Take 1 tablet (10 mg total) by mouth at bedtime.   30 tablet   0   . Darunavir Ethanolate (PREZISTA) 800 MG tablet   Oral   Take 1 tablet (800 mg total) by mouth daily with breakfast.   30 tablet   11   . desloratadine (CLARINEX) 5 MG tablet               . doxycycline (ADOXA) 50 MG tablet   Oral   Take 50 mg by mouth 3 (three) times daily. dermatologist          . doxycycline (VIBRAMYCIN) 100 MG capsule   Oral   Take 1 capsule (100 mg total) by mouth 2 (two) times daily.   30 capsule   5   . hydrochlorothiazide (HYDRODIURIL) 25 MG tablet   Oral   Take 1 tablet (25 mg total) by mouth daily.   30 tablet   5   . montelukast (SINGULAIR) 10 MG tablet               . predniSONE (DELTASONE) 20 MG tablet   Oral   Take 20 mg by mouth daily.         Marland Kitchen  ritonavir (NORVIR) 100 MG TABS tablet   Oral   Take 1 tablet (100 mg total) by mouth daily.   30 tablet   11   . valACYclovir (VALTREX) 500 MG tablet   Oral   Take 1 tablet (500 mg total) by mouth daily. Must be seen for any additional refills   30 tablet   0     Allergies Quinapril hcl  No family history on file.  Social History Social History  Substance Use Topics  . Smoking status: Never Smoker   . Smokeless tobacco: Never Used  . Alcohol Use: 1.2 oz/week    2 Standard drinks or equivalent per week    Review of Systems Constitutional: Negative for fever. Cardiovascular: Right-sided chest wall pain. Central chest pain. Respiratory: Negative for shortness of breath. Negative for significant cough. Negative for sputum production. Gastrointestinal: Negative for abdominal pain, vomiting and diarrhea. Musculoskeletal: Right chest wall pain Neurological: Negative for headache 10-point ROS otherwise negative.  ____________________________________________   PHYSICAL EXAM:  VITAL  SIGNS: ED Triage Vitals  Enc Vitals Group     BP 08/29/15 1544 161/104 mmHg     Pulse Rate 08/29/15 1544 71     Resp 08/29/15 1544 18     Temp 08/29/15 1544 98.4 F (36.9 C)     Temp Source 08/29/15 1544 Oral     SpO2 08/29/15 1544 99 %     Weight 08/29/15 1544 202 lb (91.627 kg)     Height 08/29/15 1544 6\' 2"  (1.88 m)     Head Cir --      Peak Flow --      Pain Score 08/29/15 1544 4     Pain Loc --      Pain Edu? --      Excl. in [place]? --     Constitutional: Alert and oriented. Well appearing and in no distress. Eyes: Normal exam ENT   Head: Normocephalic and atraumatic.   Mouth/Throat: Mucous membranes are moist. Cardiovascular: Normal rate, regular rhythm. No murmur Respiratory: Normal respiratory effort without tachypnea nor retractions. Breath sounds are clear. No wheezes, rales or rhonchi. The patient does have ecchymosis to the right lateral mid chest, with moderate tenderness palpation of this area. Gastrointestinal: Soft and nontender. No distention. No right upper quadrant tenderness. Musculoskeletal: Nontender with normal range of motion in all extremities. Neurologic:  Normal speech and language. No gross focal neurologic deficits  Skin:  Skin is warm, dry and intact.  Psychiatric: Mood and affect are normal.   ____________________________________________    EKG  EKG reviewed and interpreted by myself shows normal sinus rhythm at 71 bpm, narrow QRS, normal axis, normal intervals, no ST changes. Normal EKG.  ____________________________________________    RADIOLOGY  X-ray consistent with right sided opacities pulmonary contusion versus pneumonia  INITIAL IMPRESSION / ASSESSMENT AND PLAN / ED COURSE  Pertinent labs & imaging results that were available during my care of the patient were reviewed by me and considered in my medical decision making (see chart for details).  The patient presents emergency Department several days after a fall with  right-sided and central chest pain worse with movement or deep inspiration. Patient does have right lateral chest ecchymosis, with significant tenderness to palpation. Clear lung sounds bilaterally. EKG is reassuring. Chest x-ray consistent right sided opacities pneumonia versus contusion. Patient's labs are normal including negative troponin. Given the patient's pulmonary contusion versus pneumonia we will cover with antibiotics, discharged with pain medication and incentive spirometry. I discussed  with the patient performing incentives spirometry 3 times per hour while awake for the next 1 week. Patient will follow up with his primary care physician. I discussed return precautions for any worsening pain, or fever. The patient is agreeable to this plan.  ____________________________________________   FINAL CLINICAL IMPRESSION(S) / ED DIAGNOSES  Chest pain Pulmonary contusion Pneumonia   Harvest Dark, MD 08/29/15 1800

## 2015-08-29 NOTE — ED Notes (Signed)
Patient presents to the ED with left chest pain since yesterday evening.  Patient reports occasional diaphoresis.  Patient ambulatory to triage without obvious distress.  Skin warm and dry and color wnl for ethnicity.  Patient also reports falling recently and soreness to his right lower ribs.  Patient states rib pain is causing him to feel somewhat short of breath.  Patient speaking in full sentences without difficulty.

## 2015-08-29 NOTE — ED Notes (Signed)
Patient discharge instructions and prescriptions review with patient. Patient verbalizes understanding and states that he does not have any questions.

## 2015-08-31 ENCOUNTER — Emergency Department
Admission: EM | Admit: 2015-08-31 | Discharge: 2015-08-31 | Disposition: A | Discharge status: Home / Self Care | Payer: Self-pay | Attending: Emergency Medicine | Admitting: Emergency Medicine

## 2015-08-31 DIAGNOSIS — M545 Low back pain: Secondary | ICD-10-CM

## 2015-08-31 DIAGNOSIS — Z21 Asymptomatic human immunodeficiency virus [HIV] infection status: Secondary | ICD-10-CM | POA: Insufficient documentation

## 2015-08-31 DIAGNOSIS — I10 Essential (primary) hypertension: Secondary | ICD-10-CM | POA: Insufficient documentation

## 2015-08-31 DIAGNOSIS — Z79899 Other long term (current) drug therapy: Secondary | ICD-10-CM | POA: Insufficient documentation

## 2015-08-31 DIAGNOSIS — M5440 Lumbago with sciatica, unspecified side: Secondary | ICD-10-CM | POA: Insufficient documentation

## 2015-08-31 MED ORDER — LIDOCAINE 5 % EX PTCH: 1.0000 | MEDICATED_PATCH | Freq: Two times a day (BID) | CUTANEOUS | Status: DC

## 2015-08-31 NOTE — ED Provider Notes (Signed)
[facility] Emergency Department Provider Note    ____________________________________________  Time seen: ~1410  I have reviewed the triage vital signs and the nursing notes.   HISTORY  Chief Complaint Back Pain   History limited by: Not Limited   HPI Louis [name] is a 55 y.o. male who presents to the emergency department today because of concerns for low back pain. The back pain started yesterday. It is located in the lower back. It is bilateral sides. It is not associated with any weakness, numbness or pain in his legs. He has not noticed any change to bowel or bladder function. He was recently seen in the emergency department 2 days ago for pneumonia. He states that his breathing has improved. He did start the medications yesterday. Patient states he thinks he might of had a fever this morning.   Past Medical History  Diagnosis Date  . Seborrhea 06/23/2004  . Dermatophytosis of foot 06/23/2004  . HIV infection ([place]) 12/1995    Patient Active Problem List   Diagnosis Date Noted  . Gout 01/05/2015  . Renal insufficiency 11/07/2012  . Hypertension 06/23/2010  . Rosacea 06/23/2010  . Encounter for long-term (current) use of other medications 06/09/2010  . Screening examination for venereal disease 06/09/2010  . Encounter for preventive health examination   . Human immunodeficiency virus (HIV) disease ([place]) 01/02/1996    Past Surgical History  Procedure Laterality Date  . Hernia repair      Current Outpatient Rx  Name  Route  Sig  Dispense  Refill  . abacavir-lamiVUDine (EPZICOM) 600-300 MG per tablet   Oral   Take 1 tablet by mouth daily.   30 tablet   11   . amLODipine (NORVASC) 5 MG tablet               . atenolol (TENORMIN) 50 MG tablet   Oral   Take 1 tablet (50 mg total) by mouth 2 (two) times daily.   60 tablet   5   . colchicine 0.6 MG tablet   Oral   Take 1 tablet (0.6 mg total) by mouth 2 (two) times daily.    60 tablet   5   . cyclobenzaprine (FLEXERIL) 10 MG tablet   Oral   Take 1 tablet (10 mg total) by mouth at bedtime.   30 tablet   0   . Darunavir Ethanolate (PREZISTA) 800 MG tablet   Oral   Take 1 tablet (800 mg total) by mouth daily with breakfast.   30 tablet   11   . desloratadine (CLARINEX) 5 MG tablet               . doxycycline (ADOXA) 50 MG tablet   Oral   Take 50 mg by mouth 3 (three) times daily. dermatologist          . doxycycline (VIBRAMYCIN) 100 MG capsule   Oral   Take 1 capsule (100 mg total) by mouth 2 (two) times daily.   30 capsule   5   . hydrochlorothiazide (HYDRODIURIL) 25 MG tablet   Oral   Take 1 tablet (25 mg total) by mouth daily.   30 tablet   5   . levofloxacin (LEVAQUIN) 750 MG tablet   Oral   Take 1 tablet (750 mg total) by mouth daily.   7 tablet   0   . montelukast (SINGULAIR) 10 MG tablet               .  oxyCODONE-acetaminophen (ROXICET) 5-325 MG tablet   Oral   Take 1 tablet by mouth every 6 (six) hours as needed.   20 tablet   0   . predniSONE (DELTASONE) 20 MG tablet   Oral   Take 20 mg by mouth daily.         . ritonavir (NORVIR) 100 MG TABS tablet   Oral   Take 1 tablet (100 mg total) by mouth daily.   30 tablet   11   . valACYclovir (VALTREX) 500 MG tablet   Oral   Take 1 tablet (500 mg total) by mouth daily. Must be seen for any additional refills   30 tablet   0     Allergies Quinapril hcl  No family history on file.  Social History Social History  Substance Use Topics  . Smoking status: Never Smoker   . Smokeless tobacco: Never Used  . Alcohol Use: 1.2 oz/week    2 Standard drinks or equivalent per week    Review of Systems  Constitutional: Negative for fever. Cardiovascular: Positive for chest pain, improving Respiratory: Positive for shortness of breath, improving Gastrointestinal: Negative for abdominal pain, vomiting and diarrhea. Genitourinary: Negative for  dysuria. Musculoskeletal: Positive for low back pain Skin: Negative for rash. Neurological: Negative for headaches, focal weakness or numbness.  10-point ROS otherwise negative.  ____________________________________________   PHYSICAL EXAM:  VITAL SIGNS: ED Triage Vitals  Enc Vitals Group     BP 08/31/15 1137 156/101 mmHg     Pulse Rate 08/31/15 1137 78     Resp 08/31/15 1137 20     Temp 08/31/15 1137 99.5 F (37.5 C)     Temp Source 08/31/15 1137 Oral     SpO2 08/31/15 1137 99 %     Weight 08/31/15 1137 202 lb (91.627 kg)     Height 08/31/15 1137 6\' 2"  (1.88 m)     Head Cir --      Peak Flow --      Pain Score 08/31/15 1352 8   Constitutional: Alert and oriented. Well appearing and in no distress. Eyes: Conjunctivae are normal. PERRL. Normal extraocular movements. ENT   Head: Normocephalic and atraumatic.   Nose: No congestion/rhinnorhea.   Mouth/Throat: Mucous membranes are moist.   Neck: No stridor. Hematological/Lymphatic/Immunilogical: No cervical lymphadenopathy. Cardiovascular: Normal rate, regular rhythm.  No murmurs, rubs, or gallops. Respiratory: Normal respiratory effort without tachypnea nor retractions. Breath sounds are clear and equal bilaterally. No wheezes/rales/rhonchi. Gastrointestinal: Soft and nontender. No distention.  Genitourinary: Deferred Musculoskeletal: Normal range of motion in all extremities. No joint effusions.  No lower extremity tenderness nor edema.No spinal tenderness. Mild tenderness to palpation of the lower paraspinal muscles. No tenderness over the sciatic joint. Mild tenderness elicited with elevation of the right leg. Strength and sensation intact in the lower extremities Neurologic:  Normal speech and language. No gross focal neurologic deficits are appreciated.  Skin:  Skin is warm, dry and intact. No rash noted. Psychiatric: Mood and affect are normal. Speech and behavior are normal. Patient exhibits appropriate  insight and judgment.  ____________________________________________    LABS (pertinent positives/negatives)  None  ____________________________________________   EKG  None  ____________________________________________    RADIOLOGY  None  ____________________________________________   PROCEDURES  Procedure(s) performed: None  Critical Care performed: No  ____________________________________________   INITIAL IMPRESSION / ASSESSMENT AND PLAN / ED COURSE  Pertinent labs & imaging results that were available during my care of the patient were reviewed by me and  considered in my medical decision making (see chart for details).  Patient presents to the emergency department today because of low back pain. On exam patient has no spinal tenderness. He is afebrile. He had slight pain elicited with right straight leg raise. This point I would doubt discitis or vertebral infection. I doubt fracture given lack of tenderness. Doubt central cord lesion given lack of concerning symptoms. Will give patient Lidoderm patches. Patient was given prescription for narcotic pain medication on recent ER visit. Will give patient information on exercises to help with back pain.  ____________________________________________   FINAL CLINICAL IMPRESSION(S) / ED DIAGNOSES  Final diagnoses:  Bilateral low back pain, with sciatica presence unspecified     Note: This dictation was prepared with Dragon dictation. Any transcriptional errors that result from this process are unintentional    Nance Pear, MD 08/31/15 1421

## 2015-08-31 NOTE — Discharge Instructions (Signed)
Please seek medical attention for any high fevers, chest pain, shortness of breath, change in behavior, persistent vomiting, bloody stool or any other new or concerning symptoms.   Back Exercises If you have pain in your back, do these exercises 2-3 times each day or as told by your doctor. When the pain goes away, do the exercises once each day, but repeat the steps more times for each exercise (do more repetitions). If you do not have pain in your back, do these exercises once each day or as told by your doctor. EXERCISES Single Knee to Chest Do these steps 3-5 times in a row for each leg: 1. Lie on your back on a firm bed or the floor with your legs stretched out. 2. Bring one knee to your chest. 3. Hold your knee to your chest by grabbing your knee or thigh. 4. Pull on your knee until you feel a gentle stretch in your lower back. 5. Keep doing the stretch for 10-30 seconds. 6. Slowly let go of your leg and straighten it. Pelvic Tilt Do these steps 5-10 times in a row: 1. Lie on your back on a firm bed or the floor with your legs stretched out. 2. Bend your knees so they point up to the ceiling. Your feet should be flat on the floor. 3. Tighten your lower belly (abdomen) muscles to press your lower back against the floor. This will make your tailbone point up to the ceiling instead of pointing down to your feet or the floor. 4. Stay in this position for 5-10 seconds while you gently tighten your muscles and breathe evenly. Cat-Cow Do these steps until your lower back bends more easily: 1. Get on your hands and knees on a firm surface. Keep your hands under your shoulders, and keep your knees under your hips. You may put padding under your knees. 2. Let your head hang down, and make your tailbone point down to the floor so your lower back is round like the back of a cat. 3. Stay in this position for 5 seconds. 4. Slowly lift your head and make your tailbone point up to the ceiling so your  back hangs low (sags) like the back of a cow. 5. Stay in this position for 5 seconds. Press-Ups Do these steps 5-10 times in a row: 1. Lie on your belly (face-down) on the floor. 2. Place your hands near your head, about shoulder-width apart. 3. While you keep your back relaxed and keep your hips on the floor, slowly straighten your arms to raise the top half of your body and lift your shoulders. Do not use your back muscles. To make yourself more comfortable, you may change where you place your hands. 4. Stay in this position for 5 seconds. 5. Slowly return to lying flat on the floor. Bridges Do these steps 10 times in a row: 1. Lie on your back on a firm surface. 2. Bend your knees so they point up to the ceiling. Your feet should be flat on the floor. 3. Tighten your butt muscles and lift your butt off of the floor until your waist is almost as high as your knees. If you do not feel the muscles working in your butt and the back of your thighs, slide your feet 1-2 inches farther away from your butt. 4. Stay in this position for 3-5 seconds. 5. Slowly lower your butt to the floor, and let your butt muscles relax. If this exercise is too easy,  try doing it with your arms crossed over your chest. Belly Crunches Do these steps 5-10 times in a row: 1. Lie on your back on a firm bed or the floor with your legs stretched out. 2. Bend your knees so they point up to the ceiling. Your feet should be flat on the floor. 3. Cross your arms over your chest. 4. Tip your chin a little bit toward your chest but do not bend your neck. 5. Tighten your belly muscles and slowly raise your chest just enough to lift your shoulder blades a tiny bit off of the floor. 6. Slowly lower your chest and your head to the floor. Back Lifts Do these steps 5-10 times in a row: 1. Lie on your belly (face-down) with your arms at your sides, and rest your forehead on the floor. 2. Tighten the muscles in your legs and your  butt. 3. Slowly lift your chest off of the floor while you keep your hips on the floor. Keep the back of your head in line with the curve in your back. Look at the floor while you do this. 4. Stay in this position for 3-5 seconds. 5. Slowly lower your chest and your face to the floor. GET HELP IF:  Your back pain gets a lot worse when you do an exercise.  Your back pain does not lessen 2 hours after you exercise. If you have any of these problems, stop doing the exercises. Do not do them again unless your doctor says it is okay. GET HELP RIGHT AWAY IF:  You have sudden, very bad back pain. If this happens, stop doing the exercises. Do not do them again unless your doctor says it is okay.   This information is not intended to replace advice given to you by your health care provider. Make sure you discuss any questions you have with your health care provider.   Document Released: 03/04/2010 Document Revised: 10/21/2014 Document Reviewed: 03/26/2014 Elsevier Interactive Patient Education [employer].

## 2015-08-31 NOTE — ED Notes (Signed)
Reports being seen and dx with pneumonia last week, states he feels better except pain in lower back when he coughs.  States the toradol they gave him is not helping the pain and his work note ran out yesterday.

## 2015-10-04 ENCOUNTER — Other Ambulatory Visit: Payer: Self-pay | Admitting: Family Medicine

## 2015-10-04 DIAGNOSIS — I10 Essential (primary) hypertension: Secondary | ICD-10-CM

## 2015-10-12 ENCOUNTER — Other Ambulatory Visit: Payer: Self-pay | Admitting: Family Medicine

## 2015-10-12 DIAGNOSIS — I10 Essential (primary) hypertension: Secondary | ICD-10-CM

## 2015-10-14 ENCOUNTER — Ambulatory Visit: Payer: Self-pay | Admitting: Family Medicine

## 2015-12-12 ENCOUNTER — Other Ambulatory Visit: Payer: Self-pay | Admitting: Family Medicine

## 2015-12-12 DIAGNOSIS — I10 Essential (primary) hypertension: Secondary | ICD-10-CM

## 2016-01-05 ENCOUNTER — Emergency Department
Admission: EM | Admit: 2016-01-05 | Discharge: 2016-01-05 | Disposition: A | Discharge status: Home / Self Care | Payer: Self-pay | Attending: Emergency Medicine | Admitting: Emergency Medicine

## 2016-01-05 DIAGNOSIS — Z21 Asymptomatic human immunodeficiency virus [HIV] infection status: Secondary | ICD-10-CM | POA: Insufficient documentation

## 2016-01-05 DIAGNOSIS — I10 Essential (primary) hypertension: Secondary | ICD-10-CM | POA: Insufficient documentation

## 2016-01-05 DIAGNOSIS — Z79899 Other long term (current) drug therapy: Secondary | ICD-10-CM | POA: Insufficient documentation

## 2016-01-05 DIAGNOSIS — R21 Rash and other nonspecific skin eruption: Secondary | ICD-10-CM

## 2016-01-05 MED ORDER — HYDROXYZINE HCL 50 MG PO TABS: 50.0000 mg | ORAL_TABLET | Freq: Three times a day (TID) | ORAL | 0 refills | Status: DC | PRN

## 2016-01-05 MED ORDER — DESOXIMETASONE 0.25 % EX CREA: 1.0000 "application " | TOPICAL_CREAM | Freq: Two times a day (BID) | CUTANEOUS | 0 refills | Status: DC

## 2016-01-05 MED ORDER — METHYLPREDNISOLONE 4 MG PO TBPK: ORAL_TABLET | ORAL | 0 refills | Status: DC

## 2016-01-05 NOTE — ED Provider Notes (Signed)
[facility] Emergency Department Provider Note   ____________________________________________   First MD Initiated Contact with Patient 01/05/16 1127     (approximate)  I have reviewed the triage vital signs and the nursing notes.   HISTORY  Chief Complaint Rash    HPI Louis [name] is a 55 y.o. male patient complaining of circular  itching rash. Rash to trunk and upper extremities for 3 weeks. Patient stated mild transient relief with over-the-counter hydrocortisone. No palliative measures for this complaint. Patient denies pain with this complaint. Patient denies new foods, personal hygiene, or large products.   Past Medical History:  Diagnosis Date  . Dermatophytosis of foot 06/23/2004  . HIV infection ([place]) 12/1995  . Seborrhea 06/23/2004    Patient Active Problem List   Diagnosis Date Noted  . Gout 01/05/2015  . Renal insufficiency 11/07/2012  . Hypertension 06/23/2010  . Rosacea 06/23/2010  . Encounter for long-term (current) use of other medications 06/09/2010  . Screening examination for venereal disease 06/09/2010  . Encounter for preventive health examination   . Human immunodeficiency virus (HIV) disease 01/02/1996    Past Surgical History:  Procedure Laterality Date  . HERNIA REPAIR      Prior to Admission medications   Medication Sig Start Date End Date Taking? Authorizing Provider  abacavir-lamiVUDine (EPZICOM) 600-300 MG per tablet Take 1 tablet by mouth daily. 01/31/13   Thayer Headings, MD  amLODipine (NORVASC) 5 MG tablet  11/06/12   Historical Provider, MD  atenolol (TENORMIN) 50 MG tablet TAKE 1 TABLET (50 MG TOTAL) BY MOUTH 2 (TWO) TIMES DAILY. 10/13/15   Roselee Nova, MD  colchicine 0.6 MG tablet Take 1 tablet (0.6 mg total) by mouth 2 (two) times daily. 01/05/15   Ashok Norris, MD  cyclobenzaprine (FLEXERIL) 10 MG tablet Take 1 tablet (10 mg total) by mouth at bedtime. 01/13/15   Ashok Norris, MD    Darunavir Ethanolate (PREZISTA) 800 MG tablet Take 1 tablet (800 mg total) by mouth daily with breakfast. 01/31/13   Thayer Headings, MD  desloratadine (CLARINEX) 5 MG tablet  10/30/12   Historical Provider, MD  desoximetasone (TOPICORT) 0.25 % cream Apply 1 application topically 2 (two) times daily. 01/05/16   Sable Feil, PA-C  doxycycline (ADOXA) 50 MG tablet Take 50 mg by mouth 3 (three) times daily. dermatologist     Historical Provider, MD  doxycycline (VIBRAMYCIN) 100 MG capsule Take 1 capsule (100 mg total) by mouth 2 (two) times daily. 01/05/15   Ashok Norris, MD  hydrochlorothiazide (HYDRODIURIL) 25 MG tablet TAKE 1 TABLET (25 MG TOTAL) BY MOUTH DAILY. 10/13/15   Roselee Nova, MD  hydrOXYzine (ATARAX/VISTARIL) 50 MG tablet Take 1 tablet (50 mg total) by mouth 3 (three) times daily as needed. 01/05/16   Sable Feil, PA-C  lidocaine (LIDODERM) 5 % Place 1 patch onto the skin every 12 (twelve) hours. Remove & Discard patch within 12 hours or as directed by MD 08/31/15 08/30/16  Nance Pear, MD  methylPREDNISolone (MEDROL DOSEPAK) 4 MG TBPK tablet Take Tapered dose as directed 01/05/16   Sable Feil, PA-C  montelukast (SINGULAIR) 10 MG tablet  11/06/12   Historical Provider, MD  oxyCODONE-acetaminophen (ROXICET) 5-325 MG tablet Take 1 tablet by mouth every 6 (six) hours as needed. 08/29/15   Harvest Dark, MD  predniSONE (DELTASONE) 20 MG tablet Take 20 mg by mouth daily.    Historical Provider, MD  ritonavir (NORVIR) 100 MG TABS  tablet Take 1 tablet (100 mg total) by mouth daily. 01/31/13   Thayer Headings, MD  valACYclovir (VALTREX) 500 MG tablet Take 1 tablet (500 mg total) by mouth daily. Must be seen for any additional refills 07/14/15   Ashok Norris, MD    Allergies Quinapril hcl  No family history on file.  Social History Social History  Substance Use Topics  . Smoking status: Never Smoker  . Smokeless tobacco: Never Used  . Alcohol use 1.2 oz/week    2  Standard drinks or equivalent per week    Review of Systems Constitutional: No fever/chills Eyes: No visual changes. ENT: No sore throat. Cardiovascular: Denies chest pain. Respiratory: Denies shortness of breath. Gastrointestinal: No abdominal pain.  No nausea, no vomiting.  No diarrhea.  No constipation. Genitourinary: Negative for dysuria. Musculoskeletal: Negative for back pain. Skin: Negative for rash. Neurological: Negative for headaches, focal weakness or numbness. Endocrine: Hypertension Hematological/Lymphatic:HIV Allergic/Immunilogical: See medication list ____________________________________________   PHYSICAL EXAM:  VITAL SIGNS: ED Triage Vitals  Enc Vitals Group     BP 01/05/16 1112 (!) 142/101     Pulse Rate 01/05/16 1112 69     Resp 01/05/16 1112 17     Temp 01/05/16 1112 98.8 F (37.1 C)     Temp Source 01/05/16 1112 Oral     SpO2 01/05/16 1112 96 %     Weight 01/05/16 1113 202 lb (91.6 kg)     Height 01/05/16 1113 6\' 2"  (1.88 m)     Head Circumference --      Peak Flow --      Pain Score --      Pain Loc --      Pain Edu? --      Excl. in [place]? --     Constitutional: Alert and oriented. Well appearing and in no acute distress. Eyes: Conjunctivae are normal. PERRL. EOMI. Head: Atraumatic. Nose: No congestion/rhinnorhea. Mouth/Throat: Mucous membranes are moist.  Oropharynx non-erythematous. Neck: No stridor.  No cervical spine tenderness to palpation. Hematological/Lymphatic/Immunilogical: No cervical lymphadenopathy. Cardiovascular: Normal rate, regular rhythm. Grossly normal heart sounds.  Good peripheral circulation. Respiratory: Normal respiratory effort.  No retractions. Lungs CTAB. Gastrointestinal: Soft and nontender. No distention. No abdominal bruits. No CVA tenderness. Musculoskeletal: No lower extremity tenderness nor edema.  No joint effusions. Neurologic:  Normal speech and language. No gross focal neurologic deficits are appreciated.  No gait instability. Skin:  Skin is warm, dry and intact. Annular erythematous macular lesion on truck and arms. Psychiatric: Mood and affect are normal. Speech and behavior are normal.  ____________________________________________   LABS (all labs ordered are listed, but only abnormal results are displayed)  Labs Reviewed - No data to display ____________________________________________  EKG   ____________________________________________  RADIOLOGY   ____________________________________________   PROCEDURES  Procedure(s) performed: None  Procedures  Critical Care performed: No  ____________________________________________   INITIAL IMPRESSION / ASSESSMENT AND PLAN / ED COURSE  Pertinent labs & imaging results that were available during my care of the patient were reviewed by me and considered in my medical decision making (see chart for details).  Annular macular lesions.  Patient given discharge care instructions. Patient advised to follow-up with dermatology for definitive diagnosis. Patient given a prescription for Atarax, tropical, and Medrol Dosepak.  Clinical Course      ____________________________________________   FINAL CLINICAL IMPRESSION(S) / ED DIAGNOSES  Final diagnoses:  Rash and nonspecific skin eruption      NEW MEDICATIONS STARTED DURING THIS VISIT:  Discharge Medication List as of 01/05/2016 11:54 AM    START taking these medications   Details  desoximetasone (TOPICORT) 0.25 % cream Apply 1 application topically 2 (two) times daily., Starting Wed 01/05/2016, Print    hydrOXYzine (ATARAX/VISTARIL) 50 MG tablet Take 1 tablet (50 mg total) by mouth 3 (three) times daily as needed., Starting Wed 01/05/2016, Print    methylPREDNISolone (MEDROL DOSEPAK) 4 MG TBPK tablet Take Tapered dose as directed, Print         Note:  This document was prepared using Dragon voice recognition software and may include unintentional dictation  errors.    Sable Feil, PA-C 01/05/16 [place] Quigley, MD 01/05/16 1325

## 2016-01-05 NOTE — ED Notes (Signed)
Pt verbalized understanding of discharge instructions. NAD at this time. 

## 2016-01-05 NOTE — Discharge Instructions (Signed)
Take medications as directed and advised follow-up with dermatology for definitive diagnosis.

## 2016-01-05 NOTE — ED Triage Notes (Signed)
Circular red itching rash to legs and arms X 3 weeks. Pt alert and oriented X4, active, cooperative, pt in NAD. RR even and unlabored, color WNL.

## 2016-03-10 NOTE — Telephone Encounter (Signed)
Chart opened in error

## 2016-03-16 ENCOUNTER — Ambulatory Visit (INDEPENDENT_AMBULATORY_CARE_PROVIDER_SITE_OTHER): Payer: Self-pay | Admitting: Family Medicine

## 2016-03-16 ENCOUNTER — Encounter: Payer: Self-pay | Admitting: Family Medicine

## 2016-03-16 DIAGNOSIS — I10 Essential (primary) hypertension: Secondary | ICD-10-CM

## 2016-03-16 DIAGNOSIS — L719 Rosacea, unspecified: Secondary | ICD-10-CM

## 2016-03-16 DIAGNOSIS — M1A0710 Idiopathic chronic gout, right ankle and foot, without tophus (tophi): Secondary | ICD-10-CM

## 2016-03-16 DIAGNOSIS — R21 Rash and other nonspecific skin eruption: Secondary | ICD-10-CM

## 2016-03-16 DIAGNOSIS — M62838 Other muscle spasm: Secondary | ICD-10-CM

## 2016-03-16 MED ORDER — CYCLOBENZAPRINE HCL 10 MG PO TABS: 10.0000 mg | ORAL_TABLET | Freq: Every day | ORAL | 0 refills | Status: DC

## 2016-03-16 MED ORDER — ATENOLOL 50 MG PO TABS: 50.0000 mg | ORAL_TABLET | Freq: Two times a day (BID) | ORAL | 0 refills | Status: DC

## 2016-03-16 MED ORDER — HYDROCHLOROTHIAZIDE 25 MG PO TABS: 25.0000 mg | ORAL_TABLET | Freq: Every day | ORAL | 0 refills | Status: DC

## 2016-03-16 MED ORDER — DOXYCYCLINE HYCLATE 100 MG PO CAPS: 100.0000 mg | ORAL_CAPSULE | Freq: Two times a day (BID) | ORAL | 5 refills | Status: DC

## 2016-03-16 MED ORDER — COLCHICINE 0.6 MG PO TABS: 0.6000 mg | ORAL_TABLET | Freq: Every day | ORAL | 0 refills | Status: DC | PRN

## 2016-03-16 NOTE — Progress Notes (Signed)
Name: Louis [name]   MRN: [id number]    DOB: 1961-01-13   Date:03/16/2016       Progress Note Subjective  Chief Complaint  Chief Complaint  Patient presents with  . Medication Refill    Hypertension  This is a chronic problem. The problem is unchanged. The problem is controlled. Pertinent negatives include no blurred vision, palpitations or shortness of breath. Past treatments include beta blockers and diuretics. There is no history of kidney disease, CAD/MI or CVA.   Neck Stiffness: Pt. Takes Cyclobenzaprine for relief of neck muscle spasm and tightness, no history of degenerative disc disease or arthritis in cervical spine, no history of surgery of the neck. Takes Cyclobenzaprine as needed.   Gout: Has chronic gout, most attacks are in the right and left big toes, some in the right knee. He takes Colchicine 0.6 mg for acute attack.    Past Medical History:  Diagnosis Date  . Dermatophytosis of foot 06/23/2004  . HIV infection ([place]) 12/1995  . Seborrhea 06/23/2004    Past Surgical History:  Procedure Laterality Date  . HERNIA REPAIR      History reviewed. No pertinent family history.  Social History   Social History  . Marital status: Married    Spouse name: N/A  . Number of children: N/A  . Years of education: N/A   Occupational History  . Not on file.   Social History Main Topics  . Smoking status: Never Smoker  . Smokeless tobacco: Never Used  . Alcohol use 1.2 oz/week    2 Standard drinks or equivalent per week  . Drug use: No  . Sexual activity: Yes    Partners: Female     Comment: declined condoms   Other Topics Concern  . Not on file   Social History Narrative  . No narrative on file     Current Outpatient Prescriptions:  .  abacavir-lamiVUDine (EPZICOM) 600-300 MG per tablet, Take 1 tablet by mouth daily., Disp: 30 tablet, Rfl: 11 .  atenolol (TENORMIN) 50 MG tablet, TAKE 1 TABLET (50 MG TOTAL) BY MOUTH 2 (TWO) TIMES DAILY., Disp: 60 tablet,  Rfl: 0 .  colchicine 0.6 MG tablet, Take 1 tablet (0.6 mg total) by mouth 2 (two) times daily., Disp: 60 tablet, Rfl: 5 .  cyclobenzaprine (FLEXERIL) 10 MG tablet, Take 1 tablet (10 mg total) by mouth at bedtime., Disp: 30 tablet, Rfl: 0 .  Darunavir Ethanolate (PREZISTA) 800 MG tablet, Take 1 tablet (800 mg total) by mouth daily with breakfast., Disp: 30 tablet, Rfl: 11 .  desloratadine (CLARINEX) 5 MG tablet, , Disp: , Rfl:  .  desoximetasone (TOPICORT) 0.25 % cream, Apply 1 application topically 2 (two) times daily., Disp: 30 g, Rfl: 0 .  doxycycline (ADOXA) 50 MG tablet, Take 50 mg by mouth 3 (three) times daily. dermatologist , Disp: , Rfl:  .  doxycycline (VIBRAMYCIN) 100 MG capsule, Take 1 capsule (100 mg total) by mouth 2 (two) times daily., Disp: 30 capsule, Rfl: 5 .  hydrochlorothiazide (HYDRODIURIL) 25 MG tablet, TAKE 1 TABLET (25 MG TOTAL) BY MOUTH DAILY., Disp: 30 tablet, Rfl: 0 .  hydrOXYzine (ATARAX/VISTARIL) 50 MG tablet, Take 1 tablet (50 mg total) by mouth 3 (three) times daily as needed., Disp: 30 tablet, Rfl: 0 .  montelukast (SINGULAIR) 10 MG tablet, , Disp: , Rfl:  .  valACYclovir (VALTREX) 500 MG tablet, Take 1 tablet (500 mg total) by mouth daily. Must be seen for any additional refills, Disp: 30  tablet, Rfl: 0 .  amLODipine (NORVASC) 5 MG tablet, , Disp: , Rfl:  .  lidocaine (LIDODERM) 5 %, Place 1 patch onto the skin every 12 (twelve) hours. Remove & Discard patch within 12 hours or as directed by MD (Patient not taking: Reported on 03/16/2016), Disp: 10 patch, Rfl: 0 .  methylPREDNISolone (MEDROL DOSEPAK) 4 MG TBPK tablet, Take Tapered dose as directed (Patient not taking: Reported on 03/16/2016), Disp: 21 tablet, Rfl: 0 .  oxyCODONE-acetaminophen (ROXICET) 5-325 MG tablet, Take 1 tablet by mouth every 6 (six) hours as needed. (Patient not taking: Reported on 03/16/2016), Disp: 20 tablet, Rfl: 0 .  predniSONE (DELTASONE) 20 MG tablet, Take 20 mg by mouth daily., Disp: , Rfl:  .   ritonavir (NORVIR) 100 MG TABS tablet, Take 1 tablet (100 mg total) by mouth daily. (Patient not taking: Reported on 03/16/2016), Disp: 30 tablet, Rfl: 11  Allergies  Allergen Reactions  . Quinapril Hcl Swelling    Face swells     Review of Systems  Eyes: Negative for blurred vision.  Respiratory: Negative for shortness of breath.   Cardiovascular: Negative for palpitations.    Objective  Vitals:   03/16/16 1348  BP: 132/83  Pulse: (!) 103  Resp: 17  Temp: 99.5 F (37.5 C)  TempSrc: Oral  SpO2: 94%  Weight: 196 lb (88.9 kg)  Height: 6\' 2"  (1.88 m)    Physical Exam  Constitutional: He is oriented to person, place, and time and well-developed, well-nourished, and in no distress.  HENT:  Head: Normocephalic and atraumatic.  Cardiovascular: Normal rate, regular rhythm and normal heart sounds.   No murmur heard. Pulmonary/Chest: Effort normal and breath sounds normal. He has no wheezes.  Neurological: He is alert and oriented to person, place, and time.  Skin: Rash noted. Rash is maculopapular. There is erythema.     Psychiatric: Mood, memory, affect and judgment normal.  Nursing note and vitals reviewed.      Assessment & Plan  1. Essential hypertension BP stable on present antihypertensive therapy - atenolol (TENORMIN) 50 MG tablet; Take 1 tablet (50 mg total) by mouth 2 (two) times daily.  Dispense: 180 tablet; Refill: 0 - hydrochlorothiazide (HYDRODIURIL) 25 MG tablet; Take 1 tablet (25 mg total) by mouth daily.  Dispense: 90 tablet; Refill: 0  2. Chronic idiopathic gout involving toe of right foot without tophus Recommended that he use: Seen once daily when needed for acute gout attack as colchicine as interactions with antiretrovirals therapy. Recommended he return for consideration of urate lowering therapy - colchicine 0.6 MG tablet; Take 1 tablet (0.6 mg total) by mouth daily as needed.  Dispense: 30 tablet; Refill: 0 - Uric acid - COMPLETE METABOLIC PANEL  WITH GFR  3. Rosacea  - doxycycline (VIBRAMYCIN) 100 MG capsule; Take 1 capsule (100 mg total) by mouth 2 (two) times daily.  Dispense: 60 capsule; Refill: 5  4. Muscle spasms of neck  - cyclobenzaprine (FLEXERIL) 10 MG tablet; Take 1 tablet (10 mg total) by mouth at bedtime.  Dispense: 30 tablet; Refill: 0  5. Erythematous rash Maculopapular erythematous rash on the trunk and left arm, unresponsive to oral and topical steroid therapy. We'll refer to dermatology - Ambulatory referral to Dermatology   [facility] 03/16/2016 2:02 PM

## 2016-03-17 LAB — COMPLETE METABOLIC PANEL WITH GFR
ALT: 29 U/L (ref 9–46)
AST: 55 U/L — ABNORMAL HIGH (ref 10–35)
Albumin: 2.7 g/dL — ABNORMAL LOW (ref 3.6–5.1)
Alkaline Phosphatase: 107 U/L (ref 40–115)
BUN: 15 mg/dL (ref 7–25)
CO2: 27 mmol/L (ref 20–31)
Calcium: 8.6 mg/dL (ref 8.6–10.3)
Chloride: 103 mmol/L (ref 98–110)
Creat: 2.14 mg/dL — ABNORMAL HIGH (ref 0.70–1.33)
GFR, Est African American: 39 mL/min — ABNORMAL LOW (ref >=60)
GFR, Est Non African American: 34 mL/min — ABNORMAL LOW (ref >=60)
Glucose, Bld: 91 mg/dL (ref 65–99)
Potassium: 4.7 mmol/L (ref 3.5–5.3)
Sodium: 137 mmol/L (ref 135–146)
Total Bilirubin: 0.3 mg/dL (ref 0.2–1.2)
Total Protein: 8.5 g/dL — ABNORMAL HIGH (ref 6.1–8.1)

## 2016-03-17 LAB — URIC ACID: Uric Acid, Serum: 10.1 mg/dL — ABNORMAL HIGH (ref 4.0–8.0)

## 2016-03-29 ENCOUNTER — Ambulatory Visit (INDEPENDENT_AMBULATORY_CARE_PROVIDER_SITE_OTHER): Payer: Self-pay | Admitting: Family Medicine

## 2016-03-29 ENCOUNTER — Encounter: Payer: Self-pay | Admitting: Family Medicine

## 2016-03-29 VITALS — BP 138/72 | HR 90 | Temp 99.2°F | Resp 16 | Ht 74.0 in | Wt 199.8 lb

## 2016-03-29 DIAGNOSIS — M1A3710 Chronic gout due to renal impairment, right ankle and foot, without tophus (tophi): Secondary | ICD-10-CM

## 2016-03-29 DIAGNOSIS — H9202 Otalgia, left ear: Secondary | ICD-10-CM | POA: Insufficient documentation

## 2016-03-29 MED ORDER — OFLOXACIN 0.3 % OT SOLN: 10.0000 [drp] | Freq: Every day | OTIC | 0 refills | Status: AC

## 2016-03-29 MED ORDER — FEBUXOSTAT 40 MG PO TABS: 40.0000 mg | ORAL_TABLET | Freq: Every day | ORAL | 0 refills | Status: DC

## 2016-03-29 NOTE — Progress Notes (Signed)
Name: Louis [name]   MRN: [id number]    DOB: December 17, 1960   Date:03/29/2016       Progress Note  Subjective  Chief Complaint  Chief Complaint  Patient presents with  . Follow-up    Uric acid / labs / Gout    Otalgia   There is pain in the left ear. This is a new problem. The current episode started in the past 7 days (2 days ago). There has been no fever. Associated symptoms include rhinorrhea. Pertinent negatives include no ear discharge or sore throat. Associated symptoms comments: Has history of sinus infections in the past, has been on sinus medications.    Pt. Presents for evaluation of hyperuricemia in the setting of chronic gout affecting the right big toe. His uric acid level drawn last week was elevated to 10.1mg /dL, and GFR was 39. He takes Colchicine for gout flare ups.   Past Medical History:  Diagnosis Date  . Dermatophytosis of foot 06/23/2004  . HIV infection ([place]) 12/1995  . Seborrhea 06/23/2004    Past Surgical History:  Procedure Laterality Date  . HERNIA REPAIR      History reviewed. No pertinent family history.  Social History   Social History  . Marital status: Married    Spouse name: N/A  . Number of children: N/A  . Years of education: N/A   Occupational History  . Not on file.   Social History Main Topics  . Smoking status: Never Smoker  . Smokeless tobacco: Never Used  . Alcohol use 1.2 oz/week    2 Standard drinks or equivalent per week  . Drug use: No  . Sexual activity: Yes    Partners: Female     Comment: declined condoms   Other Topics Concern  . Not on file   Social History Narrative  . No narrative on file     Current Outpatient Prescriptions:  .  abacavir-lamiVUDine (EPZICOM) 600-300 MG per tablet, Take 1 tablet by mouth daily., Disp: 30 tablet, Rfl: 11 .  amLODipine (NORVASC) 5 MG tablet, , Disp: , Rfl:  .  atenolol (TENORMIN) 50 MG tablet, Take 1 tablet (50 mg total) by mouth 2 (two) times daily., Disp: 180 tablet,  Rfl: 0 .  colchicine 0.6 MG tablet, Take 1 tablet (0.6 mg total) by mouth daily as needed., Disp: 30 tablet, Rfl: 0 .  cyclobenzaprine (FLEXERIL) 10 MG tablet, Take 1 tablet (10 mg total) by mouth at bedtime., Disp: 30 tablet, Rfl: 0 .  Darunavir Ethanolate (PREZISTA) 800 MG tablet, Take 1 tablet (800 mg total) by mouth daily with breakfast., Disp: 30 tablet, Rfl: 11 .  desloratadine (CLARINEX) 5 MG tablet, , Disp: , Rfl:  .  desoximetasone (TOPICORT) 0.25 % cream, Apply 1 application topically 2 (two) times daily., Disp: 30 g, Rfl: 0 .  doxycycline (ADOXA) 50 MG tablet, Take 50 mg by mouth 3 (three) times daily. dermatologist , Disp: , Rfl:  .  doxycycline (VIBRAMYCIN) 100 MG capsule, Take 1 capsule (100 mg total) by mouth 2 (two) times daily., Disp: 60 capsule, Rfl: 5 .  hydrochlorothiazide (HYDRODIURIL) 25 MG tablet, Take 1 tablet (25 mg total) by mouth daily., Disp: 90 tablet, Rfl: 0 .  hydrOXYzine (ATARAX/VISTARIL) 50 MG tablet, Take 1 tablet (50 mg total) by mouth 3 (three) times daily as needed., Disp: 30 tablet, Rfl: 0 .  lidocaine (LIDODERM) 5 %, Place 1 patch onto the skin every 12 (twelve) hours. Remove & Discard patch within 12 hours or  as directed by MD, Disp: 10 patch, Rfl: 0 .  methylPREDNISolone (MEDROL DOSEPAK) 4 MG TBPK tablet, Take Tapered dose as directed, Disp: 21 tablet, Rfl: 0 .  montelukast (SINGULAIR) 10 MG tablet, , Disp: , Rfl:  .  oxyCODONE-acetaminophen (ROXICET) 5-325 MG tablet, Take 1 tablet by mouth every 6 (six) hours as needed., Disp: 20 tablet, Rfl: 0 .  predniSONE (DELTASONE) 20 MG tablet, Take 20 mg by mouth daily., Disp: , Rfl:  .  ritonavir (NORVIR) 100 MG TABS tablet, Take 1 tablet (100 mg total) by mouth daily., Disp: 30 tablet, Rfl: 11 .  valACYclovir (VALTREX) 500 MG tablet, Take 1 tablet (500 mg total) by mouth daily. Must be seen for any additional refills, Disp: 30 tablet, Rfl: 0  Allergies  Allergen Reactions  . Quinapril Hcl Swelling    Face swells      Review of Systems  Constitutional: Negative for chills, fever and malaise/fatigue.  HENT: Positive for ear pain, rhinorrhea and sinus pain. Negative for congestion, ear discharge and sore throat.      Objective  Vitals:   03/29/16 1511  BP: 138/72  Pulse: 90  Resp: 16  Temp: 99.2 F (37.3 C)  TempSrc: Oral  SpO2: 95%  Weight: 199 lb 12.8 oz (90.6 kg)  Height: 6\' 2"  (1.88 m)    Physical Exam  Constitutional: He is well-developed, well-nourished, and in no distress.  HENT:  Head: Normocephalic.  Nose: Right sinus exhibits no maxillary sinus tenderness and no frontal sinus tenderness. Left sinus exhibits no maxillary sinus tenderness and no frontal sinus tenderness.  Left ear canal w/ cerumen impaction, tenderness during otoscopic exam, mild redness of the distal canal is visible, no drainage After some cerumen removal, patient has considerable pain with gentle insertion of otoscope,   Cardiovascular: Normal rate, regular rhythm and normal heart sounds.   No murmur heard. Pulmonary/Chest: Effort normal and breath sounds normal. He has no wheezes.  Nursing note and vitals reviewed.    Assessment & Plan 1. 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  Dispense: 90 tablet; Refill: 0  2. Otalgia of left ear Suspect otitis externa, however he still has a lot of wax after lavage and ear canal could not be fully visualized. We'll start on Floxin otic and refer to ENT for further management - ofloxacin (FLOXIN) 0.3 % otic solution; Place 10 drops into the left ear daily.  Dispense: 5 mL; Refill: 0 - Ambulatory referral to ENT  Carden Teel [facility] 03/29/2016 3:42 PM

## 2016-04-03 ENCOUNTER — Telehealth: Payer: Self-pay | Admitting: Family Medicine

## 2016-04-03 NOTE — Telephone Encounter (Signed)
Pt said that the medication that you placed him on for the Gout he will not be getting for it cost him 1000.00. He is self pay and he needs you to prescribe him something else that will not cost so much.Please advise

## 2016-04-03 NOTE — Telephone Encounter (Signed)
Pt called back to give price for the medication for his gout. $1,099.76.

## 2016-04-03 NOTE — Telephone Encounter (Signed)
We can consider allopurinol but that is limited by the worsening in his kidney function, his creatinine is elevated and he has been referred to nephrology for management. We can start on allopurinol 100 mg twice a day and closely monitor his renal function. Please confirm with patient and we'll send a prescription to his pharmacy.

## 2016-04-04 NOTE — Telephone Encounter (Signed)
LMOM for pt to call the office

## 2016-04-04 NOTE — Telephone Encounter (Signed)
Pt did call back and I read him Dr Trena Platt message and he understands it. Pt wishes to wait and think about it and he will call us back by the end of the week.

## 2016-04-19 ENCOUNTER — Other Ambulatory Visit: Payer: Self-pay | Admitting: Family Medicine

## 2016-04-19 DIAGNOSIS — M1A0710 Idiopathic chronic gout, right ankle and foot, without tophus (tophi): Secondary | ICD-10-CM

## 2016-04-26 ENCOUNTER — Ambulatory Visit: Payer: Self-pay | Admitting: Family Medicine

## 2016-05-09 ENCOUNTER — Ambulatory Visit: Payer: Self-pay | Admitting: Family Medicine

## 2016-05-11 ENCOUNTER — Ambulatory Visit
Admission: RE | Admit: 2016-05-11 | Discharge: 2016-05-11 | Disposition: A | Discharge status: Home / Self Care | Payer: Self-pay | Source: Ambulatory Visit | Attending: Family Medicine | Admitting: Family Medicine

## 2016-05-11 ENCOUNTER — Ambulatory Visit (INDEPENDENT_AMBULATORY_CARE_PROVIDER_SITE_OTHER): Payer: Self-pay | Admitting: Family Medicine

## 2016-05-11 ENCOUNTER — Encounter: Payer: Self-pay | Admitting: Family Medicine

## 2016-05-11 VITALS — BP 146/84 | HR 87 | Temp 98.9°F | Resp 18 | Ht 74.0 in | Wt 204.9 lb

## 2016-05-11 DIAGNOSIS — M7989 Other specified soft tissue disorders: Secondary | ICD-10-CM | POA: Insufficient documentation

## 2016-05-11 DIAGNOSIS — N289 Disorder of kidney and ureter, unspecified: Secondary | ICD-10-CM

## 2016-05-11 DIAGNOSIS — M79661 Pain in right lower leg: Secondary | ICD-10-CM

## 2016-05-11 LAB — COMPLETE METABOLIC PANEL WITH GFR
ALT: 9 U/L (ref 9–46)
AST: 28 U/L (ref 10–35)
Albumin: 2.4 g/dL — ABNORMAL LOW (ref 3.6–5.1)
Alkaline Phosphatase: 80 U/L (ref 40–115)
BUN: 34 mg/dL — ABNORMAL HIGH (ref 7–25)
CO2: 22 mmol/L (ref 20–31)
Calcium: 8.5 mg/dL — ABNORMAL LOW (ref 8.6–10.3)
Chloride: 108 mmol/L (ref 98–110)
Creat: 3.26 mg/dL — ABNORMAL HIGH (ref 0.70–1.33)
GFR, Est African American: 24 mL/min — ABNORMAL LOW (ref >=60)
GFR, Est Non African American: 20 mL/min — ABNORMAL LOW (ref >=60)
Glucose, Bld: 90 mg/dL (ref 65–99)
Potassium: 4.8 mmol/L (ref 3.5–5.3)
Sodium: 139 mmol/L (ref 135–146)
Total Bilirubin: 0.4 mg/dL (ref 0.2–1.2)
Total Protein: 7.7 g/dL (ref 6.1–8.1)

## 2016-05-11 NOTE — Progress Notes (Signed)
Name: Louis [name]   MRN: [id number]    DOB: [birth year]   Date:05/11/2016       Progress Note  Subjective  Chief Complaint  Chief Complaint  Patient presents with  . Foot Swelling    swelling, ankle swelling tender    HPI  Right leg swelling: Acute onset 1.5 weeks ago, no history of long travel, no trauma, gradual onset, but worse with walking on concrete floor and wearing a steel toe boot (started a new job at [employer] last month). His kidney function was below normal (GFR 39 in February 2018, could not afford to see Nephrologist at that time). Reports no fevers, chills, difficulty with weight-bearing etc.  Has not taken any medication for relief.      Past Medical History:  Diagnosis Date  . Dermatophytosis of foot 06/23/2004  . HIV infection ([place]) 12/1995  . Seborrhea 06/23/2004    Past Surgical History:  Procedure Laterality Date  . HERNIA REPAIR      No family history on file.  Social History   Social History  . Marital status: Married    Spouse name: N/A  . Number of children: N/A  . Years of education: N/A   Occupational History  . Not on file.   Social History Main Topics  . Smoking status: Never Smoker  . Smokeless tobacco: Never Used  . Alcohol use 1.2 oz/week    2 Standard drinks or equivalent per week  . Drug use: No  . Sexual activity: Yes    Partners: Female     Comment: declined condoms   Other Topics Concern  . Not on file   Social History Narrative  . No narrative on file     Current Outpatient Prescriptions:  .  abacavir-lamiVUDine (EPZICOM) 600-300 MG per tablet, Take 1 tablet by mouth daily., Disp: 30 tablet, Rfl: 11 .  amLODipine (NORVASC) 5 MG tablet, , Disp: , Rfl:  .  atenolol (TENORMIN) 50 MG tablet, Take 1 tablet (50 mg total) by mouth 2 (two) times daily., Disp: 180 tablet, Rfl: 0 .  colchicine 0.6 MG tablet, Take 1 tablet (0.6 mg total) by mouth daily as needed., Disp: 30 tablet, Rfl: 0 .  cyclobenzaprine (FLEXERIL)  10 MG tablet, Take 1 tablet (10 mg total) by mouth at bedtime., Disp: 30 tablet, Rfl: 0 .  Darunavir Ethanolate (PREZISTA) 800 MG tablet, Take 1 tablet (800 mg total) by mouth daily with breakfast., Disp: 30 tablet, Rfl: 11 .  desloratadine (CLARINEX) 5 MG tablet, , Disp: , Rfl:  .  doxycycline (ADOXA) 50 MG tablet, Take 50 mg by mouth 3 (three) times daily. dermatologist , Disp: , Rfl:  .  doxycycline (VIBRAMYCIN) 100 MG capsule, Take 1 capsule (100 mg total) by mouth 2 (two) times daily., Disp: 60 capsule, Rfl: 5 .  hydrochlorothiazide (HYDRODIURIL) 25 MG tablet, Take 1 tablet (25 mg total) by mouth daily., Disp: 90 tablet, Rfl: 0 .  montelukast (SINGULAIR) 10 MG tablet, , Disp: , Rfl:  .  ritonavir (NORVIR) 100 MG TABS tablet, Take 1 tablet (100 mg total) by mouth daily., Disp: 30 tablet, Rfl: 11 .  desoximetasone (TOPICORT) 0.25 % cream, Apply 1 application topically 2 (two) times daily. (Patient not taking: Reported on 05/11/2016), Disp: 30 g, Rfl: 0 .  hydrOXYzine (ATARAX/VISTARIL) 50 MG tablet, Take 1 tablet (50 mg total) by mouth 3 (three) times daily as needed. (Patient not taking: Reported on 05/11/2016), Disp: 30 tablet, Rfl: 0 .  valACYclovir (VALTREX)  500 MG tablet, Take 1 tablet (500 mg total) by mouth daily. Must be seen for any additional refills, Disp: 30 tablet, Rfl: 0  Allergies  Allergen Reactions  . Quinapril Hcl Swelling    Face swells     Review of Systems  Constitutional: Negative for chills, fever and weight loss.  Respiratory: Negative for cough and shortness of breath.   Cardiovascular: Positive for leg swelling. Negative for chest pain and palpitations.   Objective  Vitals:   05/11/16 1537  BP: (!) 146/84  Pulse: 87  Resp: 18  Temp: 98.9 F (37.2 C)  TempSrc: Oral  SpO2: 96%  Weight: 204 lb 14.4 oz (92.9 kg)  Height: 6\' 2"  (1.88 m)    Physical Exam  Constitutional: He is oriented to person, place, and time and well-developed, well-nourished, and in no  distress.  Cardiovascular: Normal rate, regular rhythm, S1 normal, S2 normal and normal heart sounds.   No murmur heard. Pulmonary/Chest: Effort normal and breath sounds normal. He has no wheezes. He has no rhonchi.  Abdominal: Soft. Bowel sounds are normal. There is no tenderness.  Musculoskeletal:       Right lower leg: He exhibits tenderness and edema.       Right foot: There is swelling.  2+ pitting edema on right lower extremity including proximal right foot, no erythema, mild hyperpigmentation on the lower leg,   Neurological: He is alert and oriented to person, place, and time.  Nursing note and vitals reviewed.     Assessment & Plan  1. Pain and swelling of right lower leg Rule out DVT, obtain x-rays of right foot and right lower leg for evaluation.elevate leg for relief of swelling - DG Tibia/Fibula Right; Future - DG Foot Complete Right; Future - VAS [place] LOWER EXTREMITY VENOUS (DVT); Future  2. Renal insufficiency  - COMPLETE METABOLIC PANEL WITH GFR   Xoie Kreuser [facility] 05/11/2016 4:09 PM

## 2016-05-12 ENCOUNTER — Other Ambulatory Visit: Payer: Self-pay | Admitting: Family Medicine

## 2016-05-12 DIAGNOSIS — N289 Disorder of kidney and ureter, unspecified: Secondary | ICD-10-CM

## 2016-05-12 NOTE — Progress Notes (Signed)
Urgent referral to nephrology because of rapid decline in kidney function

## 2016-05-15 ENCOUNTER — Telehealth: Payer: Self-pay | Admitting: Family Medicine

## 2016-05-15 DIAGNOSIS — R7989 Other specified abnormal findings of blood chemistry: Secondary | ICD-10-CM

## 2016-05-15 DIAGNOSIS — M7121 Synovial cyst of popliteal space [Baker], right knee: Secondary | ICD-10-CM

## 2016-05-15 NOTE — Telephone Encounter (Signed)
PT SAID THAT HE HAS NOT HEARD ANYTHING ABOUT HIS XRAYS TO R LEG FOOT AND ANKLE. HE SAID THAT YOU DID NOT GIVE HIM ANY MEDICATION AT THE TIME OF HIS VISIT LAST WEEK AND SAYS THAT THE SWELLING IS STILL VERY BAD. PLEASE ADVISE.

## 2016-05-17 DIAGNOSIS — M7121 Synovial cyst of popliteal space [Baker], right knee: Secondary | ICD-10-CM | POA: Insufficient documentation

## 2016-05-17 NOTE — Telephone Encounter (Signed)
X-ray and ultrasound of right lower extremity have been dictated. 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 Patient will be referred to orthopedics for evaluation of Baker's cyst and I have asked to schedule an appointment for examination of the lymph nodes.

## 2016-05-18 NOTE — Telephone Encounter (Signed)
Referral sent to [facility] orthopedic

## 2016-06-23 ENCOUNTER — Other Ambulatory Visit: Payer: Self-pay | Admitting: Family Medicine

## 2016-06-23 DIAGNOSIS — I10 Essential (primary) hypertension: Secondary | ICD-10-CM

## 2016-06-23 NOTE — Telephone Encounter (Signed)
Last BP and pulse reviewed; Rx approved

## 2016-08-22 ENCOUNTER — Inpatient Hospital Stay
Admission: EM | Admit: 2016-08-22 | Discharge: 2016-08-27 | DRG: 682 | Disposition: A | Discharge status: Home / Self Care | Payer: Self-pay | Attending: Internal Medicine | Admitting: Internal Medicine

## 2016-08-22 ENCOUNTER — Encounter: Payer: Self-pay | Admitting: Emergency Medicine

## 2016-08-22 ENCOUNTER — Emergency Department: Payer: Self-pay

## 2016-08-22 ENCOUNTER — Inpatient Hospital Stay: Payer: Self-pay

## 2016-08-22 DIAGNOSIS — Z8701 Personal history of pneumonia (recurrent): Secondary | ICD-10-CM

## 2016-08-22 DIAGNOSIS — R0781 Pleurodynia: Secondary | ICD-10-CM | POA: Diagnosis present

## 2016-08-22 DIAGNOSIS — Z888 Allergy status to other drugs, medicaments and biological substances status: Secondary | ICD-10-CM

## 2016-08-22 DIAGNOSIS — N183 Chronic kidney disease, stage 3 (moderate): Secondary | ICD-10-CM | POA: Diagnosis present

## 2016-08-22 DIAGNOSIS — I129 Hypertensive chronic kidney disease with stage 1 through stage 4 chronic kidney disease, or unspecified chronic kidney disease: Secondary | ICD-10-CM | POA: Diagnosis present

## 2016-08-22 DIAGNOSIS — E871 Hypo-osmolality and hyponatremia: Secondary | ICD-10-CM | POA: Diagnosis present

## 2016-08-22 DIAGNOSIS — Z8249 Family history of ischemic heart disease and other diseases of the circulatory system: Secondary | ICD-10-CM

## 2016-08-22 DIAGNOSIS — B20 Human immunodeficiency virus [HIV] disease: Secondary | ICD-10-CM | POA: Diagnosis present

## 2016-08-22 DIAGNOSIS — R0789 Other chest pain: Secondary | ICD-10-CM | POA: Diagnosis present

## 2016-08-22 DIAGNOSIS — R809 Proteinuria, unspecified: Secondary | ICD-10-CM | POA: Diagnosis present

## 2016-08-22 DIAGNOSIS — Z79899 Other long term (current) drug therapy: Secondary | ICD-10-CM

## 2016-08-22 DIAGNOSIS — N179 Acute kidney failure, unspecified: Principal | ICD-10-CM | POA: Diagnosis present

## 2016-08-22 DIAGNOSIS — N29 Other disorders of kidney and ureter in diseases classified elsewhere: Secondary | ICD-10-CM | POA: Diagnosis present

## 2016-08-22 DIAGNOSIS — M109 Gout, unspecified: Secondary | ICD-10-CM | POA: Diagnosis present

## 2016-08-22 HISTORY — DX: Essential (primary) hypertension: I10

## 2016-08-22 HISTORY — DX: Gout, unspecified: M10.9

## 2016-08-22 HISTORY — DX: Other allergy status, other than to drugs and biological substances: Z91.09

## 2016-08-22 LAB — COMPREHENSIVE METABOLIC PANEL
ALT: 14 U/L — ABNORMAL LOW (ref 17–63)
AST: 35 U/L (ref 15–41)
Albumin: 2.7 g/dL — ABNORMAL LOW (ref 3.5–5.0)
Alkaline Phosphatase: 80 U/L (ref 38–126)
Anion gap: 5 (ref 5–15)
BUN: 59 mg/dL — ABNORMAL HIGH (ref 6–20)
CO2: 20 mmol/L — ABNORMAL LOW (ref 22–32)
Calcium: 8.8 mg/dL — ABNORMAL LOW (ref 8.9–10.3)
Chloride: 106 mmol/L (ref 101–111)
Creatinine, Ser: 5.41 mg/dL — ABNORMAL HIGH (ref 0.61–1.24)
GFR calc Af Amer: 13 mL/min — ABNORMAL LOW (ref >60)
GFR calc non Af Amer: 11 mL/min — ABNORMAL LOW (ref >60)
Glucose, Bld: 84 mg/dL (ref 65–99)
Potassium: 4.4 mmol/L (ref 3.5–5.1)
Sodium: 131 mmol/L — ABNORMAL LOW (ref 135–145)
Total Bilirubin: 0.5 mg/dL (ref 0.3–1.2)
Total Protein: 9.3 g/dL — ABNORMAL HIGH (ref 6.5–8.1)

## 2016-08-22 LAB — CBC WITH DIFFERENTIAL/PLATELET
Basophils Absolute: 0 10*3/uL (ref 0–0.1)
Basophils Relative: 0 %
Eosinophils Absolute: 0.2 10*3/uL (ref 0–0.7)
Eosinophils Relative: 4 %
HCT: 34.3 % — ABNORMAL LOW (ref 40.0–52.0)
Hemoglobin: 11.8 g/dL — ABNORMAL LOW (ref 13.0–18.0)
Lymphocytes Relative: 30 %
Lymphs Abs: 1.4 10*3/uL (ref 1.0–3.6)
MCH: 36.7 pg — ABNORMAL HIGH (ref 26.0–34.0)
MCHC: 34.5 g/dL (ref 32.0–36.0)
MCV: 106.6 fL — ABNORMAL HIGH (ref 80.0–100.0)
Monocytes Absolute: 0.5 10*3/uL (ref 0.2–1.0)
Monocytes Relative: 10 %
Neutro Abs: 2.7 10*3/uL (ref 1.4–6.5)
Neutrophils Relative %: 56 %
Platelets: 176 10*3/uL (ref 150–440)
RBC: 3.21 MIL/uL — ABNORMAL LOW (ref 4.40–5.90)
RDW: 15.5 % — ABNORMAL HIGH (ref 11.5–14.5)
WBC: 4.8 10*3/uL (ref 3.8–10.6)

## 2016-08-22 LAB — TROPONIN I
Troponin I: <0.03 ng/mL (ref <0.03)
Troponin I: <0.03 ng/mL (ref <0.03)

## 2016-08-22 LAB — PHOSPHORUS: Phosphorus: 5.1 mg/dL — ABNORMAL HIGH (ref 2.5–4.6)

## 2016-08-22 LAB — BRAIN NATRIURETIC PEPTIDE: B Natriuretic Peptide: 153 pg/mL — ABNORMAL HIGH (ref 0.0–100.0)

## 2016-08-22 LAB — URIC ACID: Uric Acid, Serum: 10.4 mg/dL — ABNORMAL HIGH (ref 4.4–7.6)

## 2016-08-22 MED ORDER — HEPARIN SODIUM (PORCINE) 5000 UNIT/ML IJ SOLN
5000.0000 [IU] | Freq: Three times a day (TID) | INTRAMUSCULAR | Status: DC
  Administered 2016-08-22 – 2016-08-27 (×9): 5000 [IU] via SUBCUTANEOUS
  Filled 2016-08-22 (×10): qty 1

## 2016-08-22 MED ORDER — POLYETHYLENE GLYCOL 3350 17 G PO PACK: 17.0000 g | PACK | Freq: Every day | ORAL | Status: DC | PRN

## 2016-08-22 MED ORDER — ONDANSETRON HCL 4 MG/2ML IJ SOLN: 4.0000 mg | Freq: Four times a day (QID) | INTRAMUSCULAR | Status: DC | PRN

## 2016-08-22 MED ORDER — TRAMADOL HCL 50 MG PO TABS: 50.0000 mg | ORAL_TABLET | Freq: Four times a day (QID) | ORAL | Status: DC | PRN

## 2016-08-22 MED ORDER — ONDANSETRON HCL 4 MG PO TABS: 4.0000 mg | ORAL_TABLET | Freq: Four times a day (QID) | ORAL | Status: DC | PRN

## 2016-08-22 MED ORDER — ATENOLOL 50 MG PO TABS
50.0000 mg | ORAL_TABLET | Freq: Two times a day (BID) | ORAL | Status: DC
  Administered 2016-08-22 – 2016-08-24 (×4): 50 mg via ORAL
  Filled 2016-08-22 (×4): qty 1

## 2016-08-22 MED ORDER — ACETAMINOPHEN 325 MG PO TABS
650.0000 mg | ORAL_TABLET | Freq: Four times a day (QID) | ORAL | Status: DC | PRN
  Administered 2016-08-24: 650 mg via ORAL
  Filled 2016-08-22: qty 2

## 2016-08-22 MED ORDER — ACETAMINOPHEN 650 MG RE SUPP: 650.0000 mg | Freq: Four times a day (QID) | RECTAL | Status: DC | PRN

## 2016-08-22 MED ORDER — ALBUTEROL SULFATE (2.5 MG/3ML) 0.083% IN NEBU: 2.5000 mg | INHALATION_SOLUTION | RESPIRATORY_TRACT | Status: DC | PRN

## 2016-08-22 MED ORDER — SODIUM CHLORIDE 0.9 % IV SOLN
INTRAVENOUS | Status: DC
  Administered 2016-08-22 – 2016-08-24 (×4): via INTRAVENOUS

## 2016-08-22 NOTE — H&P (Signed)
[facility] NAME: Louis [name]    MR#:  [id number]  DATE OF BIRTH:  [birth year]  DATE OF ADMISSION:  08/22/2016  PRIMARY CARE PHYSICIAN: Roselee Nova, MD   REQUESTING/REFERRING PHYSICIAN: Dr. Burlene Arnt  CHIEF COMPLAINT:   Chief Complaint  Patient presents with  . Shortness of Breath    HISTORY OF PRESENT ILLNESS:  Louis [name]  is a 56 y.o. male with a known history of Hypertension, HIV not on treatment, tobacco abuse presents to the emergency room complaining of left-sided chest pain since Sunday. Patient feels his pain gets worse on taking a deep breath. No relation to exertion. No cough or shortness of breath. No trauma. No history of heart disease. Here in the emergency room his troponins are normal EKG is normal. No arrhythmias. His creatinine has found to be significantly worse with greater than 5 with baseline being around 3. He has had progressively worsening CKD but could not follow up with nephrology in spite of referable by PCP due to lack of insurance. Here he also has mild hyponatremia. Chest x-ray is clear. Afebrile. Normal WBC. No hypoxia. Last time he had similar symptoms 7 months back he was treated for bronchitis.  PAST MEDICAL HISTORY:   Past Medical History:  Diagnosis Date  . Dermatophytosis of foot 06/23/2004  . Environmental allergies   . Gout   . HIV infection ([place]) 12/1995  . Hypertension   . Seborrhea 06/23/2004    PAST SURGICAL HISTORY:   Past Surgical History:  Procedure Laterality Date  . HERNIA REPAIR      SOCIAL HISTORY:   Social History  Substance Use Topics  . Smoking status: Never Smoker  . Smokeless tobacco: Never Used  . Alcohol use 1.2 oz/week    2 Standard drinks or equivalent per week    FAMILY HISTORY:   Family History  Problem Relation Age of Onset  . Bradycardia Maternal Grandmother        with pacemaker    DRUG ALLERGIES:   Allergies  Allergen Reactions   . Quinapril Hcl Swelling    Face swells    REVIEW OF SYSTEMS:   Review of Systems  Constitutional: Positive for malaise/fatigue. Negative for chills, fever and weight loss.  HENT: Negative for hearing loss and nosebleeds.   Eyes: Negative for blurred vision, double vision and pain.  Respiratory: Negative for cough, hemoptysis, sputum production, shortness of breath and wheezing.   Cardiovascular: Positive for chest pain. Negative for palpitations, orthopnea and leg swelling.  Gastrointestinal: Negative for abdominal pain, constipation, diarrhea, nausea and vomiting.  Genitourinary: Positive for frequency. Negative for dysuria and hematuria.  Musculoskeletal: Negative for back pain, falls and myalgias.  Skin: Negative for rash.  Neurological: Positive for weakness. Negative for dizziness, tremors, sensory change, speech change, focal weakness, seizures and headaches.  Endo/Heme/Allergies: Does not bruise/bleed easily.  Psychiatric/Behavioral: Negative for depression and memory loss. The patient is not nervous/anxious.    MEDICATIONS AT HOME:   Prior to Admission medications   Medication Sig Start Date End Date Taking? Authorizing Provider  atenolol (TENORMIN) 50 MG tablet TAKE 1 TABLET (50 MG TOTAL) BY MOUTH 2 (TWO) TIMES DAILY. Patient taking differently: Take 100 mg by mouth 2 (two) times daily.  06/23/16  Yes Lada, Satira Anis, MD  colchicine 0.6 MG tablet Take 1 tablet (0.6 mg total) by mouth daily as needed. 03/16/16  Yes Roselee Nova, MD  doxycycline (VIBRAMYCIN) 100 MG  capsule Take 1 capsule (100 mg total) by mouth 2 (two) times daily. 03/16/16  Yes Roselee Nova, MD  hydrochlorothiazide (HYDRODIURIL) 25 MG tablet Take 1 tablet (25 mg total) by mouth daily. 03/16/16  Yes Roselee Nova, MD  hydrOXYzine (ATARAX/VISTARIL) 50 MG tablet Take 1 tablet (50 mg total) by mouth 3 (three) times daily as needed. Patient taking differently: Take 25 mg by mouth 2 (two) times daily as  needed.  01/05/16  Yes Sable Feil, PA-C  abacavir-lamiVUDine (EPZICOM) 600-300 MG per tablet Take 1 tablet by mouth daily. Patient not taking: Reported on 08/22/2016 01/31/13   Thayer Headings, MD  Darunavir Ethanolate (PREZISTA) 800 MG tablet Take 1 tablet (800 mg total) by mouth daily with breakfast. Patient not taking: Reported on 08/22/2016 01/31/13   Thayer Headings, MD  desoximetasone (TOPICORT) 0.25 % cream Apply 1 application topically 2 (two) times daily. Patient not taking: Reported on 08/22/2016 01/05/16   Sable Feil, PA-C  ritonavir (NORVIR) 100 MG TABS tablet Take 1 tablet (100 mg total) by mouth daily. Patient not taking: Reported on 08/22/2016 01/31/13   Thayer Headings, MD  valACYclovir (VALTREX) 500 MG tablet Take 1 tablet (500 mg total) by mouth daily. Must be seen for any additional refills Patient not taking: Reported on 08/22/2016 07/14/15   Ashok Norris, MD     VITAL SIGNS:  Blood pressure 127/90, pulse 74, temperature 99.1 F (37.3 C), resp. rate 16, height 6\' 2"  (1.88 m), weight 91.2 kg (201 lb), SpO2 98 %.  PHYSICAL EXAMINATION:  Physical Exam  GENERAL:  56 y.o.-year-old patient lying in the bed with no acute distress.  EYES: Pupils equal, round, reactive to light and accommodation. No scleral icterus. Extraocular muscles intact.  HEENT: Head atraumatic, normocephalic. Oropharynx and nasopharynx clear. No oropharyngeal erythema, moist oral mucosa  NECK:  Supple, no jugular venous distention. No thyroid enlargement, no tenderness.  LUNGS: Normal breath sounds bilaterally, no wheezing, rales, rhonchi. No use of accessory muscles of respiration.  CARDIOVASCULAR: S1, S2 normal. No murmurs, rubs, or gallops.  ABDOMEN: Soft, nontender, nondistended. Bowel sounds present. No organomegaly or mass.  EXTREMITIES: No pedal edema, cyanosis, or clubbing. + 2 pedal & radial pulses b/l.   NEUROLOGIC: Cranial nerves II through XII are intact. No focal Motor or sensory  deficits appreciated b/l PSYCHIATRIC: The patient is alert and oriented x 3. Good affect.  SKIN: No obvious rash, lesion, or ulcer.   LABORATORY PANEL:   CBC  Recent Labs Lab 08/22/16 1758  WBC 4.8  HGB 11.8*  HCT 34.3*  PLT 176   ------------------------------------------------------------------------------------------------------------------  Chemistries   Recent Labs Lab 08/22/16 1758  NA 131*  K 4.4  CL 106  CO2 20*  GLUCOSE 84  BUN 59*  CREATININE 5.41*  CALCIUM 8.8*  AST 35  ALT 14*  ALKPHOS 80  BILITOT 0.5   ------------------------------------------------------------------------------------------------------------------  Cardiac Enzymes  Recent Labs Lab 08/22/16 1758  TROPONINI <0.03   ------------------------------------------------------------------------------------------------------------------  RADIOLOGY:  Dg Chest 2 View  Result Date: 08/22/2016 CLINICAL DATA:  Chest discomfort, low-grade fever. EXAM: CHEST  2 VIEW COMPARISON:  Chest x-ray dated 08/29/2015. FINDINGS: Heart size and mediastinal contours are normal. Lungs are clear. No pleural effusion or pneumothorax seen. No acute or suspicious osseous finding. Old healed fracture of the left rib. Mild degenerative spurring within the thoracic spine. IMPRESSION: No active cardiopulmonary disease. No evidence of pneumonia or pulmonary edema. Electronically Signed   By: Franki Cabot  M.D.   On: 08/22/2016 16:53     IMPRESSION AND PLAN:   * Acute kidney injury over CKD stage III Could be dehydration or progressive worsening of his chronic kidney disease. We'll start IV fluids. Admit inpatient. Check renal ultrasound. He couldn't have hypertensive disease or HIV related nephropathy. Check urinalysis We'll consult nephrology. Monitor input and output. Repeat labs in the morning. Normal saline at 100 ML per hour  * Left chest pain. Troponin normal. EKG normal. Atypical symptoms. Unlikely cardiac.  We'll continue cardiac monitoring for 24 hours. Repeat troponin. No changes on chest x-ray.  * Hypertension. Hold chlorthalidone. We'll continue atenolol.  * HIV. Not on treatment for 2 years. We'll check CD4 and HIV viral load. Will need outpatient ID follow-up.  * DVT prophylaxis with heparin  All the records are reviewed and case discussed with ED provider. Management plans discussed with the patient, family and they are in agreement.  CODE STATUS: FULL CODE  TOTAL TIME TAKING CARE OF THIS PATIENT: 40 minutes.   Hillary Bow R M.D on 08/22/2016 at 7:34 PM  Between 7am to 6pm - Pager - [phone]  After 6pm go to [email or web address] - password [email or web address] [facility]  Office  [phone]  CC: Primary care physician; Roselee Nova, MD  Note: This dictation was prepared with Dragon dictation along with smaller phrase technology. Any transcriptional errors that result from this process are unintentional.

## 2016-08-22 NOTE — ED Notes (Signed)
See triage note  States he developed some discomfort in chest on Sunday  States this happens when he take a deep breath  Low grade fever on arrival

## 2016-08-22 NOTE — ED Notes (Signed)
Pt taken to US

## 2016-08-22 NOTE — ED Triage Notes (Signed)
Pt states feels like he is not able to take a deep breath since Sunday. States it hurts his lungs when he does and his ears hurt when he yawns. Took theraflu last night and "it helped a little"

## 2016-08-22 NOTE — ED Notes (Signed)
vss - pt states feels like he isnt getting a deep breath. Ears hurt when he sneezes. Had pneumonia in the past and is worried it is back. Previously on an inhaler. Not taking his allergy meds or using inhaler at this time.

## 2016-08-22 NOTE — ED Provider Notes (Signed)
[facility] Emergency Department Provider Note  ____________________________________________  Time seen: Approximately 5:54 PM  I have reviewed the triage vital signs and the nursing notes.   HISTORY  Chief Complaint Shortness of Breath    HPI Louis [name] is a 56 y.o. male with a history of HIV presenting to the emergency department with shortness of breath and left-sided chest pain that is worsened with inspiration and supine position for the past three days. Patient's shortness of breath is not exacerbated with activity. He states that he does have a history of anxiety and has an unsettled living arrangement currently. Patient states that he only needs one pillow to sleep. He denies a history of heart failure. Patient denies cough. He has had rhinorrhea and congestion. He has a history of seasonal allergies. Patient denies calf pain, recent surgery, prolonged immobilization, weight gain, weight loss or daily smoking. Patient denies incidences of trauma. He denies nausea, vomiting or abdominal pain. Patient is not currently receiving care with infectious disease for HIV and he is unsure of CD4 counts. He has been afebrile.   Past Medical History:  Diagnosis Date  . Dermatophytosis of foot 06/23/2004  . Environmental allergies   . Gout   . HIV infection ([place]) 12/1995  . Hypertension   . Seborrhea 06/23/2004    Patient Active Problem List   Diagnosis Date Noted  . AKI (acute kidney injury) ([place]) 08/22/2016  . Baker's cyst of knee, right 05/17/2016  . Elevated serum creatinine 05/17/2016  . Otalgia of left ear 03/29/2016  . Neck muscle spasm 03/16/2016  . Erythematous rash 03/16/2016  . ERRONEOUS ENCOUNTER--DISREGARD 03/10/2016  . Gout 01/05/2015  . Renal insufficiency 11/07/2012  . Hypertension 06/23/2010  . Rosacea 06/23/2010  . Encounter for long-term (current) use of other medications 06/09/2010  . Screening examination for venereal disease  06/09/2010  . Encounter for preventive health examination   . Human immunodeficiency virus (HIV) disease ([place]) 01/02/1996    Past Surgical History:  Procedure Laterality Date  . HERNIA REPAIR      Prior to Admission medications   Medication Sig Start Date End Date Taking? Authorizing Provider  atenolol (TENORMIN) 50 MG tablet TAKE 1 TABLET (50 MG TOTAL) BY MOUTH 2 (TWO) TIMES DAILY. Patient taking differently: Take 100 mg by mouth 2 (two) times daily.  06/23/16  Yes Lada, Satira Anis, MD  colchicine 0.6 MG tablet Take 1 tablet (0.6 mg total) by mouth daily as needed. 03/16/16  Yes Roselee Nova, MD  doxycycline (VIBRAMYCIN) 100 MG capsule Take 1 capsule (100 mg total) by mouth 2 (two) times daily. 03/16/16  Yes Roselee Nova, MD  hydrochlorothiazide (HYDRODIURIL) 25 MG tablet Take 1 tablet (25 mg total) by mouth daily. 03/16/16  Yes Roselee Nova, MD  hydrOXYzine (ATARAX/VISTARIL) 50 MG tablet Take 1 tablet (50 mg total) by mouth 3 (three) times daily as needed. Patient taking differently: Take 25 mg by mouth 2 (two) times daily as needed.  01/05/16  Yes Sable Feil, PA-C  abacavir-lamiVUDine (EPZICOM) 600-300 MG per tablet Take 1 tablet by mouth daily. Patient not taking: Reported on 08/22/2016 01/31/13   Thayer Headings, MD  Darunavir Ethanolate (PREZISTA) 800 MG tablet Take 1 tablet (800 mg total) by mouth daily with breakfast. Patient not taking: Reported on 08/22/2016 01/31/13   Thayer Headings, MD  desoximetasone (TOPICORT) 0.25 % cream Apply 1 application topically 2 (two) times daily. Patient not taking: Reported on 08/22/2016  01/05/16   Sable Feil, PA-C  ritonavir (NORVIR) 100 MG TABS tablet Take 1 tablet (100 mg total) by mouth daily. Patient not taking: Reported on 08/22/2016 01/31/13   Thayer Headings, MD  valACYclovir (VALTREX) 500 MG tablet Take 1 tablet (500 mg total) by mouth daily. Must be seen for any additional refills Patient not taking: Reported on 08/22/2016  07/14/15   Ashok Norris, MD    Allergies Quinapril hcl  Family History  Problem Relation Age of Onset  . Bradycardia Maternal Grandmother        with pacemaker    Social History Social History  Substance Use Topics  . Smoking status: Never Smoker  . Smokeless tobacco: Never Used  . Alcohol use 1.2 oz/week    2 Standard drinks or equivalent per week     Comment: Just on the weekend     Review of Systems  Constitutional: No fever/chills Eyes: No visual changes. No discharge ENT: No upper respiratory complaints. Cardiovascular: Patient has left sided chest pain.  Respiratory: He has shortness of breath. Gastrointestinal: No abdominal pain.  No nausea, no vomiting.  No diarrhea.  No constipation. Musculoskeletal: Negative for musculoskeletal pain. Skin: Negative for rash, abrasions, lacerations, ecchymosis. Neurological: Negative for headaches, focal weakness or numbness.  ____________________________________________   PHYSICAL EXAM:  VITAL SIGNS: ED Triage Vitals  Enc Vitals Group     BP 08/22/16 1617 127/90     Pulse Rate 08/22/16 1617 74     Resp 08/22/16 1617 16     Temp 08/22/16 1617 99.1 F (37.3 C)     Temp src --      SpO2 08/22/16 1617 98 %     Weight 08/22/16 1619 201 lb (91.2 kg)     Height 08/22/16 1619 6\' 2"  (1.88 m)     Head Circumference --      Peak Flow --      Pain Score 08/22/16 1618 0     Pain Loc --      Pain Edu? --      Excl. in [place]? --      Constitutional: Alert and oriented. Well appearing and in no acute distress. Eyes: Conjunctivae are normal. PERRL. EOMI. Head: Atraumatic. ENT:      Ears: Tympanic membranes are pearly bilaterally.      Nose: No congestion/rhinnorhea.      Mouth/Throat: Mucous membranes are moist.  Neck: No stridor. No cervical spine tenderness to palpation. Hematological/Lymphatic/Immunilogical: No cervical lymphadenopathy. Cardiovascular: Normal rate, regular rhythm. Normal S1 and S2.  Good peripheral  circulation. Respiratory: Normal respiratory effort without tachypnea or retractions. Lungs CTAB. Good air entry to the bases with no decreased or absent breath sounds. Gastrointestinal: Bowel sounds 4 quadrants. Soft and nontender to palpation. No guarding or rigidity. No palpable masses. No distention. No CVA tenderness. Musculoskeletal: Full range of motion to all extremities. No gross deformities appreciated. Neurologic:  Normal speech and language. No gross focal neurologic deficits are appreciated.  Skin:  Skin is warm, dry and intact. No rash noted. No pitting edema. Psychiatric: Mood and affect are normal. Speech and behavior are normal. Patient exhibits appropriate insight and judgement.   ____________________________________________   LABS (all labs ordered are listed, but only abnormal results are displayed)  Labs Reviewed  CBC WITH DIFFERENTIAL/PLATELET - Abnormal; Notable for the following:       Result Value   RBC 3.21 (*)    Hemoglobin 11.8 (*)    HCT 34.3 (*)  MCV 106.6 (*)    MCH 36.7 (*)    RDW 15.5 (*)    All other components within normal limits  COMPREHENSIVE METABOLIC PANEL - Abnormal; Notable for the following:    Sodium 131 (*)    CO2 20 (*)    BUN 59 (*)    Creatinine, Ser 5.41 (*)    Calcium 8.8 (*)    Total Protein 9.3 (*)    Albumin 2.7 (*)    ALT 14 (*)    GFR calc non Af Amer 11 (*)    GFR calc Af Amer 13 (*)    All other components within normal limits  BRAIN NATRIURETIC PEPTIDE - Abnormal; Notable for the following:    B Natriuretic Peptide 153.0 (*)    All other components within normal limits  BASIC METABOLIC PANEL - Abnormal; Notable for the following:    Sodium 132 (*)    CO2 17 (*)    BUN 56 (*)    Creatinine, Ser 5.23 (*)    Calcium 8.3 (*)    GFR calc non Af Amer 11 (*)    GFR calc Af Amer 13 (*)    All other components within normal limits  CBC - Abnormal; Notable for the following:    RBC 3.06 (*)    Hemoglobin 11.0 (*)     HCT 32.4 (*)    MCV 105.7 (*)    MCH 35.9 (*)    RDW 15.3 (*)    All other components within normal limits  PHOSPHORUS - Abnormal; Notable for the following:    Phosphorus 5.1 (*)    All other components within normal limits  URINALYSIS, ROUTINE W REFLEX MICROSCOPIC - Abnormal; Notable for the following:    Color, Urine YELLOW (*)    APPearance CLEAR (*)    Hgb urine dipstick MODERATE (*)    Protein, ur 100 (*)    Bacteria, UA RARE (*)    Squamous Epithelial / LPF 0-5 (*)    All other components within normal limits  URIC ACID - Abnormal; Notable for the following:    Uric Acid, Serum 10.4 (*)    All other components within normal limits  HELPER T-LYMPH-CD4 ([facility] ONLY) - Abnormal; Notable for the following:    Absolute CD 4 Helper 139 (*)    % CD 4 Pos. Lymph. 12.6 (*)    RBC 3.64 (*)    MCV 107 (*)    MCH 35.2 (*)    RDW 16.6 (*)    Hemoglobin 12.8 (*)    All other components within normal limits  PROTEIN / CREATININE RATIO, URINE - Abnormal; Notable for the following:    Protein Creatinine Ratio 2.08 (*)    All other components within normal limits  PROTEIN ELECTROPHORESIS, SERUM - Abnormal; Notable for the following:    Albumin ELP 2.5 (*)    Gamma Globulin 3.2 (*)    M-Spike, % 0.6 (*)    GLOBULIN, TOTAL 4.8 (*)    A/G Ratio 0.5 (*)    All other components within normal limits  CBC - Abnormal; Notable for the following:    RBC 2.75 (*)    Hemoglobin 10.0 (*)    HCT 28.8 (*)    MCV 105.0 (*)    MCH 36.4 (*)    RDW 15.2 (*)    Platelets 147 (*)    All other components within normal limits  BASIC METABOLIC PANEL - Abnormal; Notable for the following:  CO2 21 (*)    BUN 58 (*)    Creatinine, Ser 4.60 (*)    Calcium 8.3 (*)    GFR calc non Af Amer 13 (*)    GFR calc Af Amer 15 (*)    All other components within normal limits  CBC - Abnormal; Notable for the following:    RBC 2.92 (*)    Hemoglobin 10.5 (*)    HCT 30.6 (*)    MCV 104.8 (*)    MCH 36.1 (*)     RDW 15.6 (*)    All other components within normal limits  BASIC METABOLIC PANEL - Abnormal; Notable for the following:    Chloride 113 (*)    CO2 20 (*)    BUN 57 (*)    Creatinine, Ser 4.36 (*)    Calcium 8.4 (*)    GFR calc non Af Amer 14 (*)    GFR calc Af Amer 16 (*)    Anion gap 4 (*)    All other components within normal limits  TROPONIN I  PARATHYROID HORMONE, INTACT (NO CA)  HIV 1 RNA QUANT-NO REFLEX-BLD  TROPONIN I  TROPONIN I  HEPATITIS PANEL, ACUTE  ANA W/REFLEX IF POSITIVE  HEMOGLOBIN A1C  CK  C3 COMPLEMENT  C4 COMPLEMENT  HEPATITIS C ANTIBODY (REFLEX)  HEPATITIS B SURFACE ANTIGEN  HEPATITIS B SURFACE ANTIBODY  HEPATITIS B CORE ANTIBODY, IGM  TSH  PROTEIN ELECTRO, RANDOM URINE  ANCA TITERS  PROTIME-INR  APTT  HCV COMMENT:  MISC [facility] TEST (SEND OUT)  TYPE AND SCREEN   ____________________________________________  EKG  Normal sinus rhythm without ST segment elevation. ____________________________________________  [place], personally viewed and evaluated these images (plain radiographs) as part of my medical decision making, as well as reviewing the written report by the radiologist.  DG Chest: No consolidations or findings consistent with pneumonia.   No results found.  ____________________________________________    PROCEDURES  Procedure(s) performed:    Procedures    Medications  heparin injection 5,000 Units (5,000 Units Subcutaneous Given 08/25/16 1518)  acetaminophen (TYLENOL) tablet 650 mg (650 mg Oral Given 08/24/16 1937)    Or  acetaminophen (TYLENOL) suppository 650 mg ( Rectal See Alternative 08/24/16 1937)  polyethylene glycol (MIRALAX / GLYCOLAX) packet 17 g (not administered)  ondansetron (ZOFRAN) tablet 4 mg (not administered)    Or  ondansetron (ZOFRAN) injection 4 mg (not administered)  albuterol (PROVENTIL) (2.5 MG/3ML) 0.083% nebulizer solution 2.5 mg (not administered)  zolpidem (AMBIEN)  tablet 5 mg (5 mg Oral Given 08/24/16 2252)  0.9 %  sodium chloride infusion (1 mL Intravenous Restarted 08/24/16 2245)  labetalol (NORMODYNE,TRANDATE) injection 5-10 mg (not administered)  amLODipine (NORVASC) tablet 10 mg (10 mg Oral Given 08/25/16 0908)  hydrALAZINE (APRESOLINE) tablet 100 mg (100 mg Oral Given 08/25/16 1518)  hydrALAZINE (APRESOLINE) injection 5 mg (5 mg Intravenous Given 08/23/16 0629)  nitroGLYCERIN (NITROGLYN) 2 % ointment 0.5 inch (0.5 inches Topical Given 08/24/16 0520)  hydrALAZINE (APRESOLINE) injection 10 mg (10 mg Intravenous Given 08/24/16 0942)  nitroGLYCERIN (NITROGLYN) 2 % ointment 0.5 inch (0.5 inches Topical Given 08/25/16 0645)     ____________________________________________   INITIAL IMPRESSION / ASSESSMENT AND PLAN / ED COURSE  Pertinent labs & imaging results that were available during my care of the patient were reviewed by me and considered in my medical decision making (see chart for details).  Review of the [place] CSRS was performed in accordance of the [place] prior to dispensing  any controlled drugs.     Assessment and plan Acute kidney injury: Chest pain Patient presents to the emergency department with left-sided chest pain and shortness of breath. Troponin was reassuring. EKG conducted in the emergency department reveals normal sinus rhythm without ST segment elevation. Chest xray revealed no consolidations or findings consistent with pneumonia. CMP was concerning for acute kidney injury. Dr.Shah was consulted. Dr. Manuella Ghazi accepted patient for admission to the hospital for further care and management. ____________________________________________  FINAL CLINICAL IMPRESSION(S) / ED DIAGNOSES  Final diagnoses:  Acute kidney injury ([place])      NEW MEDICATIONS STARTED DURING THIS VISIT:  Current Discharge Medication List          This chart was dictated using voice recognition software/Dragon. Despite best efforts to proofread, errors can  occur which can change the meaning. Any change was purely unintentional.    Lannie Fields, PA-C 08/25/16 1536    Merlyn Lot, MD 08/26/16 [phone]

## 2016-08-23 LAB — TSH: TSH: 1.428 u[IU]/mL (ref 0.350–4.500)

## 2016-08-23 LAB — URINALYSIS, ROUTINE W REFLEX MICROSCOPIC
Bilirubin Urine: NEGATIVE
Glucose, UA: NEGATIVE mg/dL
Ketones, ur: NEGATIVE mg/dL
Leukocytes, UA: NEGATIVE
Nitrite: NEGATIVE
Protein, ur: 100 mg/dL — AB
Specific Gravity, Urine: 1.008 (ref 1.005–1.030)
pH: 5 (ref 5.0–8.0)

## 2016-08-23 LAB — PROTEIN / CREATININE RATIO, URINE
Creatinine, Urine: 75 mg/dL
Protein Creatinine Ratio: 2.08 mg/mg{Cre} — ABNORMAL HIGH (ref 0.00–0.15)
Total Protein, Urine: 156 mg/dL

## 2016-08-23 LAB — CBC
HCT: 32.4 % — ABNORMAL LOW (ref 40.0–52.0)
Hemoglobin: 11 g/dL — ABNORMAL LOW (ref 13.0–18.0)
MCH: 35.9 pg — ABNORMAL HIGH (ref 26.0–34.0)
MCHC: 34 g/dL (ref 32.0–36.0)
MCV: 105.7 fL — ABNORMAL HIGH (ref 80.0–100.0)
Platelets: 162 K/uL (ref 150–440)
RBC: 3.06 MIL/uL — ABNORMAL LOW (ref 4.40–5.90)
RDW: 15.3 % — ABNORMAL HIGH (ref 11.5–14.5)
WBC: 5.4 K/uL (ref 3.8–10.6)

## 2016-08-23 LAB — BASIC METABOLIC PANEL WITH GFR
Anion gap: 8 (ref 5–15)
BUN: 56 mg/dL — ABNORMAL HIGH (ref 6–20)
CO2: 17 mmol/L — ABNORMAL LOW (ref 22–32)
Calcium: 8.3 mg/dL — ABNORMAL LOW (ref 8.9–10.3)
Chloride: 107 mmol/L (ref 101–111)
Creatinine, Ser: 5.23 mg/dL — ABNORMAL HIGH (ref 0.61–1.24)
GFR calc Af Amer: 13 mL/min — ABNORMAL LOW
GFR calc non Af Amer: 11 mL/min — ABNORMAL LOW
Glucose, Bld: 88 mg/dL (ref 65–99)
Potassium: 4.1 mmol/L (ref 3.5–5.1)
Sodium: 132 mmol/L — ABNORMAL LOW (ref 135–145)

## 2016-08-23 LAB — TROPONIN I: Troponin I: <0.03 ng/mL (ref <0.03)

## 2016-08-23 LAB — CK: Total CK: 174 U/L (ref 49–397)

## 2016-08-23 MED ORDER — ZOLPIDEM TARTRATE 5 MG PO TABS
5.0000 mg | ORAL_TABLET | Freq: Every day | ORAL | Status: DC
  Administered 2016-08-23 – 2016-08-24 (×2): 5 mg via ORAL
  Filled 2016-08-23 (×4): qty 1

## 2016-08-23 MED ORDER — HYDRALAZINE HCL 20 MG/ML IJ SOLN
5.0000 mg | Freq: Once | INTRAMUSCULAR | Status: AC
  Administered 2016-08-23: 5 mg via INTRAVENOUS
  Filled 2016-08-23: qty 1

## 2016-08-23 NOTE — Consult Note (Signed)
Date: 08/23/2016                  Patient Name:  Louis [name]  MRN: [id number]  DOB: [birth year]  Age / Sex: 56 y.o., male         PCP: Roselee Nova, MD                 Service Requesting Consult: Internal medicine/ Max Sane, MD                 Reason for Consult: Acute renal failure            History of Present Illness: Patient is a 56 y.o. male with medical problems of chronic kidney disease, hypertension, gout, HIV, who was admitted to [facility] on 08/22/2016 for evaluation of not being able to breathe properly, recent pneumonia.  In the emergency room he was noted to have a creatinine of 5.4.  He was admitted for further evaluation. Patient appears to have a baseline creatinine of 1.50/GFR 60 in July 2017 Since then, his creatinine has been steadily increasing as noted in the lab trends Most recent creatinine is now 5.4/GFR 13 Patient had been taking his HIV medications until about 2 years ago when he states that he lost his job and subsequently health insurance.  He states that his blood pressure also has been relatively poorly controlled in the past His albumin level is severely low at 2.7, uric acid is high at 10.1 Urinalysis today shows moderate hemoglobin, 2-5 RBCs, 100 mg of protein Urine protein to creatinine ratio is 2.08  During interview, patient's girlfriend was present in the room.  After obtaining verbal permission from the patient and after giving opportunity for privacy, patient stated that it is okay to talk about all his health conditions in presence of his girlfriend.  Medications: Outpatient medications: Prescriptions Prior to Admission  Medication Sig Dispense Refill Last Dose  . atenolol (TENORMIN) 50 MG tablet TAKE 1 TABLET (50 MG TOTAL) BY MOUTH 2 (TWO) TIMES DAILY. (Patient taking differently: Take 100 mg by mouth 2 (two) times daily. ) 180 tablet 0 08/22/2016 at am  . colchicine 0.6 MG tablet Take 1 tablet (0.6 mg total) by mouth daily as needed.  30 tablet 0 prn at prn  . doxycycline (VIBRAMYCIN) 100 MG capsule Take 1 capsule (100 mg total) by mouth 2 (two) times daily. 60 capsule 5 08/22/2016 at am  . hydrochlorothiazide (HYDRODIURIL) 25 MG tablet Take 1 tablet (25 mg total) by mouth daily. 90 tablet 0 08/22/2016 at am  . hydrOXYzine (ATARAX/VISTARIL) 50 MG tablet Take 1 tablet (50 mg total) by mouth 3 (three) times daily as needed. (Patient taking differently: Take 25 mg by mouth 2 (two) times daily as needed. ) 30 tablet 0 prn at prn  . abacavir-lamiVUDine (EPZICOM) 600-300 MG per tablet Take 1 tablet by mouth daily. (Patient not taking: Reported on 08/22/2016) 30 tablet 11 Not Taking at Unknown time  . Darunavir Ethanolate (PREZISTA) 800 MG tablet Take 1 tablet (800 mg total) by mouth daily with breakfast. (Patient not taking: Reported on 08/22/2016) 30 tablet 11 Not Taking at Unknown time  . desoximetasone (TOPICORT) 0.25 % cream Apply 1 application topically 2 (two) times daily. (Patient not taking: Reported on 08/22/2016) 30 g 0 Not Taking at Unknown time  . ritonavir (NORVIR) 100 MG TABS tablet Take 1 tablet (100 mg total) by mouth daily. (Patient not taking: Reported on 08/22/2016) 30 tablet 11 Not  Taking at Unknown time  . valACYclovir (VALTREX) 500 MG tablet Take 1 tablet (500 mg total) by mouth daily. Must be seen for any additional refills (Patient not taking: Reported on 08/22/2016) 30 tablet 0 Completed Course at Unknown time    Current medications: Current Facility-Administered Medications  Medication Dose Route Frequency Provider Last Rate Last Dose  . 0.9 %  sodium chloride infusion   Intravenous Continuous Hillary Bow, MD 100 mL/hr at 08/23/16 0631    . acetaminophen (TYLENOL) tablet 650 mg  650 mg Oral Q6H PRN Hillary Bow, MD       Or  . acetaminophen (TYLENOL) suppository 650 mg  650 mg Rectal Q6H PRN Sudini, Srikar, MD      . albuterol (PROVENTIL) (2.5 MG/3ML) 0.083% nebulizer solution 2.5 mg  2.5 mg Nebulization Q2H  PRN Sudini, Srikar, MD      . atenolol (TENORMIN) tablet 50 mg  50 mg Oral BID Hillary Bow, MD   50 mg at 08/23/16 0742  . heparin injection 5,000 Units  5,000 Units Subcutaneous Q8H Hillary Bow, MD   5,000 Units at 08/23/16 1424  . ondansetron (ZOFRAN) tablet 4 mg  4 mg Oral Q6H PRN Sudini, Alveta Heimlich, MD       Or  . ondansetron (ZOFRAN) injection 4 mg  4 mg Intravenous Q6H PRN Sudini, Srikar, MD      . polyethylene glycol (MIRALAX / GLYCOLAX) packet 17 g  17 g Oral Daily PRN Sudini, Alveta Heimlich, MD      . zolpidem (AMBIEN) tablet 5 mg  5 mg Oral QHS Max Sane, MD          Allergies: Allergies  Allergen Reactions  . Quinapril Hcl Swelling    Face swells      Past Medical History: Past Medical History:  Diagnosis Date  . Dermatophytosis of foot 06/23/2004  . Environmental allergies   . Gout   . HIV infection ([place]) 12/1995  . Hypertension   . Seborrhea 06/23/2004     Past Surgical History: Past Surgical History:  Procedure Laterality Date  . HERNIA REPAIR       Family History: Family History  Problem Relation Age of Onset  . Bradycardia Maternal Grandmother        with pacemaker     Social History: Social History   Social History  . Marital status: Married    Spouse name: N/A  . Number of children: N/A  . Years of education: N/A   Occupational History  . Not on file.   Social History Main Topics  . Smoking status: Never Smoker  . Smokeless tobacco: Never Used  . Alcohol use 1.2 oz/week    2 Standard drinks or equivalent per week     Comment: Just on the weekend  . Drug use: No  . Sexual activity: Yes    Partners: Female     Comment: declined condoms   Other Topics Concern  . Not on file   Social History Narrative  . No narrative on file     Review of Systems: Gen: Denies fevers or chills HEENT: No vision or hearing problems CV: no shortness of breath or chest pain Resp: no breathing problems at present GI: denies nausea or vomiting.  No  blood in the stool.  No diarrhea or constipation GU : denies blood in the urine MS: no acute complaints Derm:  no acute complaints Psych:difficulty sleeping at night Heme: no complaints Neuro: no complaints Endocrine.  No complaints  Vital Signs:  Blood pressure (!) 142/98, pulse 68, temperature 98.6 F (37 C), temperature source Oral, resp. rate 18, height 6\' 2"  (1.88 m), weight 91.7 kg (202 lb 1.6 oz), SpO2 98 %.   Intake/Output Summary (Last 24 hours) at 08/23/16 1513 Last data filed at 08/23/16 1348  Gross per 24 hour  Intake             1603 ml  Output              575 ml  Net             1028 ml    Weight trends: [employer]   08/22/16 1619 08/23/16 0500  Weight: 91.2 kg (201 lb) 91.7 kg (202 lb 1.6 oz)    Physical Exam: General:  no acute distress, sitting up in the bed, diaphoretic  HEENT Anicteric, moist oral mucous membranes  Neck:  supple  Lungs: Normal breathing effort, clear to auscultation  Heart::  soft systolic murmur, regular  Abdomen: Soft, nontender, nondistended  Extremities:  no edema  Neurologic: Alert, oriented  Skin: peeling rash over face             Lab results: Basic Metabolic Panel:  Recent Labs Lab 08/22/16 1758 08/22/16 1931 08/23/16 0614  NA 131*  --  132*  K 4.4  --  4.1  CL 106  --  107  CO2 20*  --  17*  GLUCOSE 84  --  88  BUN 59*  --  56*  CREATININE 5.41*  --  5.23*  CALCIUM 8.8*  --  8.3*  PHOS  --  5.1*  --     Liver Function Tests:  Recent Labs Lab 08/22/16 1758  AST 35  ALT 14*  ALKPHOS 80  BILITOT 0.5  PROT 9.3*  ALBUMIN 2.7*   No results for input(s): LIPASE, AMYLASE in the last 168 hours. No results for input(s): AMMONIA in the last 168 hours.  CBC:  Recent Labs Lab 08/22/16 1758 08/23/16 0614  WBC 4.8 5.4  NEUTROABS 2.7  --   HGB 11.8* 11.0*  HCT 34.3* 32.4*  MCV 106.6* 105.7*  PLT 176 162    Cardiac Enzymes:  Recent Labs Lab 08/23/16 0614 08/23/16 1407  CKTOTAL  --  174   TROPONINI <0.03  --     BNP: Invalid input(s): POCBNP  CBG: No results for input(s): GLUCAP in the last 168 hours.  Microbiology: No results found for this or any previous visit (from the past 720 hour(s)).   Coagulation Studies: No results for input(s): LABPROT, INR in the last 72 hours.  Urinalysis:  Recent Labs  08/22/16 0210  COLORURINE YELLOW*  LABSPEC 1.008  PHURINE 5.0  GLUCOSEU NEGATIVE  HGBUR MODERATE*  BILIRUBINUR NEGATIVE  KETONESUR NEGATIVE  PROTEINUR 100*  NITRITE NEGATIVE  LEUKOCYTESUR NEGATIVE        Imaging: Dg Chest 2 View  Result Date: 08/22/2016 CLINICAL DATA:  Chest discomfort, low-grade fever. EXAM: CHEST  2 VIEW COMPARISON:  Chest x-ray dated 08/29/2015. FINDINGS: Heart size and mediastinal contours are normal. Lungs are clear. No pleural effusion or pneumothorax seen. No acute or suspicious osseous finding. Old healed fracture of the left rib. Mild degenerative spurring within the thoracic spine. IMPRESSION: No active cardiopulmonary disease. No evidence of pneumonia or pulmonary edema. Electronically Signed   By: Franki Cabot M.D.   On: 08/22/2016 16:53   US Renal  Result Date: 08/22/2016 CLINICAL DATA:  Acute renal insufficiency. EXAM: RENAL / URINARY TRACT  ULTRASOUND COMPLETE COMPARISON:  09/20/2012 FINDINGS: Right Kidney: Length: 13.7 cm.  Increased echogenicity.  No hydronephrosis. Left Kidney: Length: 14.4 cm. Increased echogenicity. No hydronephrosis. Lower pole left renal lesion is likely a cyst or minimally complex cyst at 9 mm. Bladder: Within normal limits.  Bilateral ureteric jets identified. Incidental note is made of mild prostatomegaly. IMPRESSION: 1. Increased renal echogenicity, which could represent medical renal disease, sickle-cell disease, HIV nephropathy, amyloid. 2. Prostatomegaly. Electronically Signed   By: Abigail Miyamoto M.D.   On: 08/22/2016 20:30      Assessment & Plan: Pt is a 56 y.o. african American  male with  poorly controlled hypertension, HIV no meds for 2 years, gout,h/o NSAID use was admitted on 08/22/2016 .   1.  Acute renal failure, chronic kidney disease stage II 2.  Proteinuria 2.  Poorly controlled hypertension 4.  HIV.  No meds for 2 years 5.  Hyperuricemia, gout  Patient has progressively worsening kidney disease for the past year or so.  His baseline creatinine was 1.5/GFR 60 back in July of 2017.  Since then, his creatinine has been increasing.  His admission creatinine is 5.4.  He has proteinuria of 2 g.  Patient has history of significant nonsteroidal use.  He also has HIV and has not been taking his medication for the past 2 years.  Differential diagnosis of worsening renal failure includes HIV nephropathy, FSGS, interstitial nephritis, NSAID-induced nephropathy, hypertension nephrosclerosis  Plan: Obtain serologies.  Urine protein to creatinine ratio.  Renal ultrasound shows relatively enlarged kidneys suspicious of HIV nephropathy.  Discussed kidney biopsy to obtain definitive diagnosis.  Patient has agreed to proceed.  Risks, benefits, alternatives were explained.  Set up biopsy for tomorrow. Continue iv fluids to correct any pre-renal component

## 2016-08-23 NOTE — Progress Notes (Signed)
[facility] [place] NAME: Azai Gaffin    MR#:  [id number]  DATE OF BIRTH:  03/08/1960  SUBJECTIVE:  CHIEF COMPLAINT:   Chief Complaint  Patient presents with  . Shortness of Breath  c/o chest pain mainly on deep breaths REVIEW OF SYSTEMS:  Review of Systems  Constitutional: Negative for chills, fever and weight loss.  HENT: Negative for nosebleeds and sore throat.   Eyes: Negative for blurred vision.  Respiratory: Negative for cough, shortness of breath and wheezing.   Cardiovascular: Positive for chest pain. Negative for orthopnea, leg swelling and PND.  Gastrointestinal: Negative for abdominal pain, constipation, diarrhea, heartburn, nausea and vomiting.  Genitourinary: Negative for dysuria and urgency.  Musculoskeletal: Negative for back pain.  Skin: Negative for rash.  Neurological: Negative for dizziness, speech change, focal weakness and headaches.  Endo/Heme/Allergies: Does not bruise/bleed easily.  Psychiatric/Behavioral: Negative for depression.    DRUG ALLERGIES:   Allergies  Allergen Reactions  . Quinapril Hcl Swelling    Face swells   VITALS:  Blood pressure (!) 158/89, pulse 74, temperature 98.7 F (37.1 C), temperature source Oral, resp. rate (!) 21, height 6\' 2"  (1.88 m), weight 91.7 kg (202 lb 1.6 oz), SpO2 98 %. PHYSICAL EXAMINATION:  Physical Exam  Constitutional: He is oriented to person, place, and time and well-developed, well-nourished, and in no distress.  HENT:  Head: Normocephalic and atraumatic.  Eyes: Conjunctivae and EOM are normal. Pupils are equal, round, and reactive to light.  Neck: Normal range of motion. Neck supple. No tracheal deviation present. No thyromegaly present.  Cardiovascular: Normal rate, regular rhythm and normal heart sounds.   Pulmonary/Chest: Effort normal and breath sounds normal. No respiratory distress. He has no wheezes. He exhibits no tenderness.  Abdominal: Soft.  Bowel sounds are normal. He exhibits no distension. There is no tenderness.  Musculoskeletal: Normal range of motion.  Neurological: He is alert and oriented to person, place, and time. No cranial nerve deficit.  Skin: Skin is warm and dry. No rash noted.  Psychiatric: Mood and affect normal.   LABORATORY PANEL:  Male CBC  Recent Labs Lab 08/23/16 0614  WBC 5.4  HGB 11.0*  HCT 32.4*  PLT 162   ------------------------------------------------------------------------------------------------------------------ Chemistries   Recent Labs Lab 08/22/16 1758 08/23/16 0614  NA 131* 132*  K 4.4 4.1  CL 106 107  CO2 20* 17*  GLUCOSE 84 88  BUN 59* 56*  CREATININE 5.41* 5.23*  CALCIUM 8.8* 8.3*  AST 35  --   ALT 14*  --   ALKPHOS 80  --   BILITOT 0.5  --    RADIOLOGY:  Dg Chest 2 View  Result Date: 08/22/2016 CLINICAL DATA:  Chest discomfort, low-grade fever. EXAM: CHEST  2 VIEW COMPARISON:  Chest x-ray dated 08/29/2015. FINDINGS: Heart size and mediastinal contours are normal. Lungs are clear. No pleural effusion or pneumothorax seen. No acute or suspicious osseous finding. Old healed fracture of the left rib. Mild degenerative spurring within the thoracic spine. IMPRESSION: No active cardiopulmonary disease. No evidence of pneumonia or pulmonary edema. Electronically Signed   By: Franki Cabot M.D.   On: 08/22/2016 16:53   US Renal  Result Date: 08/22/2016 CLINICAL DATA:  Acute renal insufficiency. EXAM: RENAL / URINARY TRACT ULTRASOUND COMPLETE COMPARISON:  09/20/2012 FINDINGS: Right Kidney: Length: 13.7 cm.  Increased echogenicity.  No hydronephrosis. Left Kidney: Length: 14.4 cm. Increased echogenicity. No hydronephrosis. Lower pole left renal lesion is likely  a cyst or minimally complex cyst at 9 mm. Bladder: Within normal limits.  Bilateral ureteric jets identified. Incidental note is made of mild prostatomegaly. IMPRESSION: 1. Increased renal echogenicity, which could  represent medical renal disease, sickle-cell disease, HIV nephropathy, amyloid. 2. Prostatomegaly. Electronically Signed   By: Abigail Miyamoto M.D.   On: 08/22/2016 20:30   ASSESSMENT AND PLAN:  56 y.o. male with medical problems of chronic kidney disease, hypertension, gout, HIV, who was admitted to [facility] on 08/22/2016 for evaluation of not being able to breathe properly, recent pneumonia.   * Acute on CKD stage II - per nephro it could be anything from HIV nephropathy, FSGS, interstitial nephritis, NSAID-induced nephropathy, hypertension nephrosclerosis - work up in progress  * Left chest pain: seems pleuritic in nature. Monitor, no MI  * Hypertension. Hold chlorthalidone. continue atenolol.  * HIV. Not on treatment for 2 years. We'll check CD4 and HIV viral load. c/s ID   * DVT prophylaxis with heparin     All the records are reviewed and case discussed with Care Management/Social Worker. Management plans discussed with the patient, family, Nephro and they are in agreement.  CODE STATUS: Full Code  TOTAL TIME TAKING CARE OF THIS PATIENT: 35 minutes.   More than 50% of the time was spent in counseling/coordination of care: YES  POSSIBLE D/C IN 3-4 DAYS, DEPENDING ON CLINICAL CONDITION. And ID and Nephro eval   Max Sane M.D on 08/23/2016 at 4:31 PM  Between 7am to 6pm - Pager - [phone]  After 6pm go to [email or web address] - [employer] [facility]  Office  [phone]  CC: Primary care physician; Roselee Nova, MD  Note: This dictation was prepared with Dragon dictation along with smaller phrase technology. Any transcriptional errors that result from this process are unintentional.

## 2016-08-23 NOTE — Care Management (Addendum)
CM Screen due to lack of payor.  Patient confirms he does not have insurance.  He is employed.  Current with PCP Dr Keith Rake. Says he pays for his medications.  Discussed use of generics and [email or web address].  He is familiar.  Patient is HIV positive but no treatment due to inability to afford medications. Has not taken his HIV meds for 2 years "since he lost his job and insurance.  he was being followed "in [place]" but never discussed the lack of a job, insurance and inability to pay for meds.  If there is an option to have his HIV followed in [facility], he would like to pursue.  Discussed most likely there are patient assistance program for HIV patients to assist with medications.   Admitted with acute renal injury on chronic stage .  Nephrology consult pending. Reached out to attending regarding ID consult in regards to patient HIV status and managmenment. Discussed that CM will follow for his discharge meds

## 2016-08-23 NOTE — Consult Note (Signed)
[facility] Infectious Disease     Reason for Consult: HIV, Renal failuare    Referring Physician: Max Sane Date of Admission:  08/22/2016   Active Problems:   AKI (acute kidney injury) ([place])   HPI: Louis [name] is a 56 y.o. male with long standing HIV, off meds for 2 years now admitted with CP and SOB. Found to have progressive renal failure. Had stopped meds in 2015 due to insurance issues and has not been seen in ID clinic since that time. He has been using Nsaids and also had a recent otpt treatment for PNA.   Past Medical History:  Diagnosis Date  . Dermatophytosis of foot 06/23/2004  . Environmental allergies   . Gout   . HIV infection ([place]) 12/1995  . Hypertension   . Seborrhea 06/23/2004   Past Surgical History:  Procedure Laterality Date  . HERNIA REPAIR     Social History  Substance Use Topics  . Smoking status: Never Smoker  . Smokeless tobacco: Never Used  . Alcohol use 1.2 oz/week    2 Standard drinks or equivalent per week     Comment: Just on the weekend   Family History  Problem Relation Age of Onset  . Bradycardia Maternal Grandmother        with pacemaker    Allergies:  Allergies  Allergen Reactions  . Quinapril Hcl Swelling    Face swells    Current antibiotics: Antibiotics Given (last 72 hours)    None      MEDICATIONS: . atenolol  50 mg Oral BID  . heparin  5,000 Units Subcutaneous Q8H  . zolpidem  5 mg Oral QHS    Review of Systems - 11 systems reviewed and negative per HPI   OBJECTIVE: Temp:  [98.5 F (36.9 C)-98.7 F (37.1 C)] 98.7 F (37.1 C) (07/11 1533) Pulse Rate:  [62-74] 74 (07/11 1533) Resp:  [16-21] 21 (07/11 1533) BP: (142-172)/(89-110) 158/89 (07/11 1533) SpO2:  [97 %-99 %] 98 % (07/11 1533) Weight:  [91.7 kg (202 lb 1.6 oz)] 91.7 kg (202 lb 1.6 oz) (07/11 0500) Physical Exam  Constitutional: He is oriented to person, place, and time. He appears well-developed and well-nourished. No distress.  HENT:   Mouth/Throat: Oropharynx is clear and moist. No oropharyngeal exudate.  Cardiovascular: Normal rate, regular rhythm and normal heart sounds. Exam reveals no gallop and no friction rub.  No murmur heard.  Pulmonary/Chest: Effort normal and breath sounds normal. No respiratory distress. He has no wheezes.  Abdominal: Soft. Bowel sounds are normal. He exhibits no distension. There is no tenderness.  Lymphadenopathy:  He has no cervical adenopathy.  Neurological: He is alert and oriented to person, place, and time.  Skin: Skin is warm and dry. No rash noted. No erythema.  Psychiatric: He has a normal mood and affect. His behavior is normal.     LABS: Results for orders placed or performed during the hospital encounter of 08/22/16 (from the past 48 hour(s))  Urinalysis, Routine w reflex microscopic     Status: Abnormal   Collection Time: 08/22/16  2:10 AM  Result Value Ref Range   Color, Urine YELLOW (A) YELLOW   APPearance CLEAR (A) CLEAR   Specific Gravity, Urine 1.008 1.005 - 1.030   pH 5.0 5.0 - 8.0   Glucose, UA NEGATIVE NEGATIVE mg/dL   Hgb urine dipstick MODERATE (A) NEGATIVE   Bilirubin Urine NEGATIVE NEGATIVE   Ketones, ur NEGATIVE NEGATIVE mg/dL   Protein, ur 100 (A)  NEGATIVE mg/dL   Nitrite NEGATIVE NEGATIVE   Leukocytes, UA NEGATIVE NEGATIVE   RBC / HPF 0-5 0 - 5 RBC/hpf   WBC, UA 0-5 0 - 5 WBC/hpf   Bacteria, UA RARE (A) NONE SEEN   Squamous Epithelial / LPF 0-5 (A) NONE SEEN   Mucous PRESENT   CBC with Differential     Status: Abnormal   Collection Time: 08/22/16  5:58 PM  Result Value Ref Range   WBC 4.8 3.8 - 10.6 K/uL   RBC 3.21 (L) 4.40 - 5.90 MIL/uL   Hemoglobin 11.8 (L) 13.0 - 18.0 g/dL   HCT 34.3 (L) 40.0 - 52.0 %   MCV 106.6 (H) 80.0 - 100.0 fL   MCH 36.7 (H) 26.0 - 34.0 pg   MCHC 34.5 32.0 - 36.0 g/dL   RDW 15.5 (H) 11.5 - 14.5 %   Platelets 176 150 - 440 K/uL   Neutrophils Relative % 56 %   Neutro Abs 2.7 1.4 - 6.5 K/uL   Lymphocytes Relative 30 %    Lymphs Abs 1.4 1.0 - 3.6 K/uL   Monocytes Relative 10 %   Monocytes Absolute 0.5 0.2 - 1.0 K/uL   Eosinophils Relative 4 %   Eosinophils Absolute 0.2 0 - 0.7 K/uL   Basophils Relative 0 %   Basophils Absolute 0.0 0 - 0.1 K/uL  Comprehensive metabolic panel     Status: Abnormal   Collection Time: 08/22/16  5:58 PM  Result Value Ref Range   Sodium 131 (L) 135 - 145 mmol/L   Potassium 4.4 3.5 - 5.1 mmol/L   Chloride 106 101 - 111 mmol/L   CO2 20 (L) 22 - 32 mmol/L   Glucose, Bld 84 65 - 99 mg/dL   BUN 59 (H) 6 - 20 mg/dL   Creatinine, Ser 5.41 (H) 0.61 - 1.24 mg/dL   Calcium 8.8 (L) 8.9 - 10.3 mg/dL   Total Protein 9.3 (H) 6.5 - 8.1 g/dL   Albumin 2.7 (L) 3.5 - 5.0 g/dL   AST 35 15 - 41 U/L   ALT 14 (L) 17 - 63 U/L   Alkaline Phosphatase 80 38 - 126 U/L   Total Bilirubin 0.5 0.3 - 1.2 mg/dL   GFR calc non Af Amer 11 (L) >60 mL/min   GFR calc Af Amer 13 (L) >60 mL/min    Comment: (NOTE) The eGFR has been calculated using the CKD EPI equation. This calculation has not been validated in all clinical situations. eGFR's persistently <60 mL/min signify possible Chronic Kidney Disease.    Anion gap 5 5 - 15  Troponin I     Status: None   Collection Time: 08/22/16  5:58 PM  Result Value Ref Range   Troponin I <0.03 <0.03 ng/mL  Brain natriuretic peptide     Status: Abnormal   Collection Time: 08/22/16  5:58 PM  Result Value Ref Range   B Natriuretic Peptide 153.0 (H) 0.0 - 100.0 pg/mL  Phosphorus     Status: Abnormal   Collection Time: 08/22/16  7:31 PM  Result Value Ref Range   Phosphorus 5.1 (H) 2.5 - 4.6 mg/dL  Uric acid     Status: Abnormal   Collection Time: 08/22/16  7:31 PM  Result Value Ref Range   Uric Acid, Serum 10.4 (H) 4.4 - 7.6 mg/dL  Troponin I (q 6hr x 3)     Status: None   Collection Time: 08/22/16 10:51 PM  Result Value Ref Range   Troponin I <  0.03 <0.03 ng/mL  Basic metabolic panel     Status: Abnormal   Collection Time: 08/23/16  6:14 AM  Result  Value Ref Range   Sodium 132 (L) 135 - 145 mmol/L   Potassium 4.1 3.5 - 5.1 mmol/L   Chloride 107 101 - 111 mmol/L   CO2 17 (L) 22 - 32 mmol/L   Glucose, Bld 88 65 - 99 mg/dL   BUN 56 (H) 6 - 20 mg/dL   Creatinine, Ser 5.23 (H) 0.61 - 1.24 mg/dL   Calcium 8.3 (L) 8.9 - 10.3 mg/dL   GFR calc non Af Amer 11 (L) >60 mL/min   GFR calc Af Amer 13 (L) >60 mL/min    Comment: (NOTE) The eGFR has been calculated using the CKD EPI equation. This calculation has not been validated in all clinical situations. eGFR's persistently <60 mL/min signify possible Chronic Kidney Disease.    Anion gap 8 5 - 15  CBC     Status: Abnormal   Collection Time: 08/23/16  6:14 AM  Result Value Ref Range   WBC 5.4 3.8 - 10.6 K/uL   RBC 3.06 (L) 4.40 - 5.90 MIL/uL   Hemoglobin 11.0 (L) 13.0 - 18.0 g/dL   HCT 32.4 (L) 40.0 - 52.0 %   MCV 105.7 (H) 80.0 - 100.0 fL   MCH 35.9 (H) 26.0 - 34.0 pg   MCHC 34.0 32.0 - 36.0 g/dL   RDW 15.3 (H) 11.5 - 14.5 %   Platelets 162 150 - 440 K/uL  Troponin I (q 6hr x 3)     Status: None   Collection Time: 08/23/16  6:14 AM  Result Value Ref Range   Troponin I <0.03 <0.03 ng/mL  Protein / creatinine ratio, urine     Status: Abnormal   Collection Time: 08/23/16 12:37 PM  Result Value Ref Range   Creatinine, Urine 75 mg/dL   Total Protein, Urine 156 mg/dL    Comment: RESULT CONFIRMED BY MANUAL DILUTION.Marland KitchenHKP NO NORMAL RANGE ESTABLISHED FOR THIS TEST    Protein Creatinine Ratio 2.08 (H) 0.00 - 0.15 mg/mg[Cre]  CK     Status: None   Collection Time: 08/23/16  2:07 PM  Result Value Ref Range   Total CK 174 49 - 397 U/L  TSH     Status: None   Collection Time: 08/23/16  2:07 PM  Result Value Ref Range   TSH 1.428 0.350 - 4.500 uIU/mL    Comment: Performed by a 3rd Generation assay with a functional sensitivity of <=0.01 uIU/mL.   No components found for: ESR, C REACTIVE PROTEIN MICRO: No results found for this or any previous visit (from the past 720  hour(s)).  IMAGING: Dg Chest 2 View  Result Date: 08/22/2016 CLINICAL DATA:  Chest discomfort, low-grade fever. EXAM: CHEST  2 VIEW COMPARISON:  Chest x-ray dated 08/29/2015. FINDINGS: Heart size and mediastinal contours are normal. Lungs are clear. No pleural effusion or pneumothorax seen. No acute or suspicious osseous finding. Old healed fracture of the left rib. Mild degenerative spurring within the thoracic spine. IMPRESSION: No active cardiopulmonary disease. No evidence of pneumonia or pulmonary edema. Electronically Signed   By: Franki Cabot M.D.   On: 08/22/2016 16:53   US Renal  Result Date: 08/22/2016 CLINICAL DATA:  Acute renal insufficiency. EXAM: RENAL / URINARY TRACT ULTRASOUND COMPLETE COMPARISON:  09/20/2012 FINDINGS: Right Kidney: Length: 13.7 cm.  Increased echogenicity.  No hydronephrosis. Left Kidney: Length: 14.4 cm. Increased echogenicity. No hydronephrosis. Lower pole left  renal lesion is likely a cyst or minimally complex cyst at 9 mm. Bladder: Within normal limits.  Bilateral ureteric jets identified. Incidental note is made of mild prostatomegaly. IMPRESSION: 1. Increased renal echogenicity, which could represent medical renal disease, sickle-cell disease, HIV nephropathy, amyloid. 2. Prostatomegaly. Electronically Signed   By: Abigail Miyamoto M.D.   On: 08/22/2016 20:30    Assessment:   Louis [name] is a 56 y.o. male with HIV, off meds for several years and last seen by ID in 2015 when he was on Prezista/ norvir and epizcom (due to renal insufficiency TDF was avoided at that time) and his VL was UD and his CD4 was 410.  He states he lost his insurance and was not following with his ID physician at [facility].  HE is now admitted with CP and has elevated cr at 5 with steady increase over the last 1 year from 1.5 to 5.4.  He has 2 gm proteinuria as well as enlarged kidneys on USS. CD4 and VL are pending. Will need to add on genotype.   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  Would benefit from starting ART ASAP  Recommendations Await CD4 and VL Added on genotype WIll place in contact with social worker with [employer]- discussed following up with me at [facility] as he lives close to it here in [place]  Thank you very much for allowing me to participate in the care of this patient. Please call with questions.   Cheral Marker. Ola Spurr, MD

## 2016-08-24 LAB — PARATHYROID HORMONE, INTACT (NO CA): PTH: 20 pg/mL (ref 15–65)

## 2016-08-24 LAB — PROTEIN ELECTROPHORESIS, SERUM
A/G Ratio: 0.5 — ABNORMAL LOW (ref 0.7–1.7)
Albumin ELP: 2.5 g/dL — ABNORMAL LOW (ref 2.9–4.4)
Alpha-1-Globulin: 0.2 g/dL (ref 0.0–0.4)
Alpha-2-Globulin: 0.7 g/dL (ref 0.4–1.0)
Beta Globulin: 0.7 g/dL (ref 0.7–1.3)
Gamma Globulin: 3.2 g/dL — ABNORMAL HIGH (ref 0.4–1.8)
Globulin, Total: 4.8 g/dL — ABNORMAL HIGH (ref 2.2–3.9)
M-Spike, %: 0.6 g/dL — ABNORMAL HIGH
Total Protein ELP: 7.3 g/dL (ref 6.0–8.5)

## 2016-08-24 LAB — HELPER T-LYMPH-CD4 (ARMC ONLY)
% CD 4 Pos. Lymph.: 12.6 % — ABNORMAL LOW (ref 30.8–58.5)
Absolute CD 4 Helper: 139 /uL — ABNORMAL LOW (ref 359–1519)
Basophils Absolute: 0 10*3/uL (ref 0.0–0.2)
Basos: 0 %
EOS (ABSOLUTE): 0.2 10*3/uL (ref 0.0–0.4)
Eos: 5 %
Hematocrit: 39.1 % (ref 37.5–51.0)
Hemoglobin: 12.8 g/dL — ABNORMAL LOW (ref 13.0–17.7)
Immature Grans (Abs): 0 10*3/uL (ref 0.0–0.1)
Immature Granulocytes: 0 %
Lymphocytes Absolute: 1.1 10*3/uL (ref 0.7–3.1)
Lymphs: 33 %
MCH: 35.2 pg — ABNORMAL HIGH (ref 26.6–33.0)
MCHC: 32.7 g/dL (ref 31.5–35.7)
MCV: 107 fL — ABNORMAL HIGH (ref 79–97)
Monocytes Absolute: 0.4 10*3/uL (ref 0.1–0.9)
Monocytes: 11 %
Neutrophils Absolute: 1.8 10*3/uL (ref 1.4–7.0)
Neutrophils: 51 %
Platelets: 179 10*3/uL (ref 150–379)
RBC: 3.64 x10E6/uL — ABNORMAL LOW (ref 4.14–5.80)
RDW: 16.6 % — ABNORMAL HIGH (ref 12.3–15.4)
WBC: 3.4 10*3/uL (ref 3.4–10.8)

## 2016-08-24 LAB — CBC
HCT: 28.8 % — ABNORMAL LOW (ref 40.0–52.0)
Hemoglobin: 10 g/dL — ABNORMAL LOW (ref 13.0–18.0)
MCH: 36.4 pg — ABNORMAL HIGH (ref 26.0–34.0)
MCHC: 34.6 g/dL (ref 32.0–36.0)
MCV: 105 fL — ABNORMAL HIGH (ref 80.0–100.0)
Platelets: 147 10*3/uL — ABNORMAL LOW (ref 150–440)
RBC: 2.75 MIL/uL — ABNORMAL LOW (ref 4.40–5.90)
RDW: 15.2 % — ABNORMAL HIGH (ref 11.5–14.5)
WBC: 5.5 10*3/uL (ref 3.8–10.6)

## 2016-08-24 LAB — BASIC METABOLIC PANEL
Anion gap: 6 (ref 5–15)
BUN: 58 mg/dL — ABNORMAL HIGH (ref 6–20)
CO2: 21 mmol/L — ABNORMAL LOW (ref 22–32)
Calcium: 8.3 mg/dL — ABNORMAL LOW (ref 8.9–10.3)
Chloride: 110 mmol/L (ref 101–111)
Creatinine, Ser: 4.6 mg/dL — ABNORMAL HIGH (ref 0.61–1.24)
GFR calc Af Amer: 15 mL/min — ABNORMAL LOW (ref >60)
GFR calc non Af Amer: 13 mL/min — ABNORMAL LOW (ref >60)
Glucose, Bld: 88 mg/dL (ref 65–99)
Potassium: 4 mmol/L (ref 3.5–5.1)
Sodium: 137 mmol/L (ref 135–145)

## 2016-08-24 LAB — PROTEIN ELECTRO, RANDOM URINE
Albumin ELP, Urine: 53.9 %
Alpha-1-Globulin, U: 0.5 %
Alpha-2-Globulin, U: 2.8 %
Beta Globulin, U: 9.6 %
Gamma Globulin, U: 33.1 %
Total Protein, Urine: 161.8 mg/dL

## 2016-08-24 LAB — HEPATITIS PANEL, ACUTE
HCV Ab: 0.1 s/co ratio (ref 0.0–0.9)
Hep A IgM: NEGATIVE
Hep B C IgM: NEGATIVE
Hepatitis B Surface Ag: NEGATIVE

## 2016-08-24 LAB — HEMOGLOBIN A1C
Hgb A1c MFr Bld: 5.1 % (ref 4.8–5.6)
Mean Plasma Glucose: 100 mg/dL

## 2016-08-24 LAB — C4 COMPLEMENT: Complement C4, Body Fluid: 21 mg/dL (ref 14–44)

## 2016-08-24 LAB — TYPE AND SCREEN
ABO/RH(D): O POS
Antibody Screen: NEGATIVE

## 2016-08-24 LAB — HIV-1 RNA QUANT-NO REFLEX-BLD
HIV 1 RNA Quant: 9320 copies/mL
LOG10 HIV-1 RNA: 3.969 log10copy/mL

## 2016-08-24 LAB — HCV COMMENT:

## 2016-08-24 LAB — ANCA TITERS
Atypical P-ANCA titer: <1:20 {titer}
C-ANCA: <1:20 {titer}
P-ANCA: <1:20 {titer}

## 2016-08-24 LAB — HEPATITIS C ANTIBODY (REFLEX): HCV Ab: 0.2 s/co ratio (ref 0.0–0.9)

## 2016-08-24 LAB — HEPATITIS B SURFACE ANTIGEN: Hepatitis B Surface Ag: NEGATIVE

## 2016-08-24 LAB — PROTIME-INR
INR: 0.92
Prothrombin Time: 12.3 seconds (ref 11.4–15.2)

## 2016-08-24 LAB — HEPATITIS B SURFACE ANTIBODY,QUALITATIVE: Hep B S Ab: NONREACTIVE

## 2016-08-24 LAB — C3 COMPLEMENT: C3 Complement: 113 mg/dL (ref 82–167)

## 2016-08-24 LAB — HEPATITIS B CORE ANTIBODY, IGM: Hep B C IgM: NEGATIVE

## 2016-08-24 LAB — APTT: aPTT: 32 seconds (ref 24–36)

## 2016-08-24 MED ORDER — HYDRALAZINE HCL 25 MG PO TABS
25.0000 mg | ORAL_TABLET | Freq: Three times a day (TID) | ORAL | Status: DC
  Administered 2016-08-24 – 2016-08-25 (×4): 25 mg via ORAL
  Filled 2016-08-24 (×4): qty 1

## 2016-08-24 MED ORDER — HYDRALAZINE HCL 20 MG/ML IJ SOLN
10.0000 mg | Freq: Once | INTRAMUSCULAR | Status: AC
  Administered 2016-08-24: 10 mg via INTRAVENOUS
  Filled 2016-08-24: qty 1

## 2016-08-24 MED ORDER — LIDOCAINE-EPINEPHRINE (PF) 2 %-1:200000 IJ SOLN
INTRAMUSCULAR | Status: AC
  Filled 2016-08-24: qty 20

## 2016-08-24 MED ORDER — SODIUM CHLORIDE 0.9 % IV SOLN
INTRAVENOUS | Status: DC
Start: 2016-08-24 — End: 2016-08-27

## 2016-08-24 MED ORDER — NITROGLYCERIN 2 % TD OINT
0.5000 [in_us] | TOPICAL_OINTMENT | Freq: Four times a day (QID) | TRANSDERMAL | Status: AC
  Administered 2016-08-24: 0.5 [in_us] via TOPICAL
  Filled 2016-08-24: qty 1

## 2016-08-24 NOTE — Progress Notes (Signed)
[facility] NAME: Trenten Watchman    MR#:  [id number]  DATE OF BIRTH:  1960-08-17  SUBJECTIVE:  CHIEF COMPLAINT:   Chief Complaint  Patient presents with  . Shortness of Breath  Feeling better, waiting for kidney biopsy today, blood pressure running high REVIEW OF SYSTEMS:  Review of Systems  Constitutional: Negative for chills, fever and weight loss.  HENT: Negative for nosebleeds and sore throat.   Eyes: Negative for blurred vision.  Respiratory: Negative for cough, shortness of breath and wheezing.   Cardiovascular: Negative for orthopnea, leg swelling and PND.  Gastrointestinal: Negative for abdominal pain, constipation, diarrhea, heartburn, nausea and vomiting.  Genitourinary: Negative for dysuria and urgency.  Musculoskeletal: Negative for back pain.  Skin: Negative for rash.  Neurological: Negative for dizziness, speech change, focal weakness and headaches.  Endo/Heme/Allergies: Does not bruise/bleed easily.  Psychiatric/Behavioral: Negative for depression.   DRUG ALLERGIES:   Allergies  Allergen Reactions  . Quinapril Hcl Swelling    Face swells   VITALS:  Blood pressure (!) 163/100, pulse 65, temperature 98.7 F (37.1 C), temperature source Oral, resp. rate 20, height 6\' 2"  (1.88 m), weight 85.6 kg (188 lb 11.2 oz), SpO2 97 %. PHYSICAL EXAMINATION:  Physical Exam  Constitutional: He is oriented to person, place, and time and well-developed, well-nourished, and in no distress.  HENT:  Head: Normocephalic and atraumatic.  Eyes: Pupils are equal, round, and reactive to light. Conjunctivae and EOM are normal.  Neck: Normal range of motion. Neck supple. No tracheal deviation present. No thyromegaly present.  Cardiovascular: Normal rate, regular rhythm and normal heart sounds.   Pulmonary/Chest: Effort normal and breath sounds normal. No respiratory distress. He has no wheezes. He exhibits no tenderness.    Abdominal: Soft. Bowel sounds are normal. He exhibits no distension. There is no tenderness.  Musculoskeletal: Normal range of motion.  Neurological: He is alert and oriented to person, place, and time. No cranial nerve deficit.  Skin: Skin is warm and dry. No rash noted.  Psychiatric: Mood and affect normal.   LABORATORY PANEL:  Male CBC  Recent Labs Lab 08/24/16 0542  WBC 5.5  HGB 10.0*  HCT 28.8*  PLT 147*   ------------------------------------------------------------------------------------------------------------------ Chemistries   Recent Labs Lab 08/22/16 1758  08/24/16 0542  NA 131*  < > 137  K 4.4  < > 4.0  CL 106  < > 110  CO2 20*  < > 21*  GLUCOSE 84  < > 88  BUN 59*  < > 58*  CREATININE 5.41*  < > 4.60*  CALCIUM 8.8*  < > 8.3*  AST 35  --   --   ALT 14*  --   --   ALKPHOS 80  --   --   BILITOT 0.5  --   --   < > = values in this interval not displayed. RADIOLOGY:  No results found. ASSESSMENT AND PLAN:  56 y.o. male with medical problems of chronic kidney disease, hypertension, gout, HIV, who was admitted to [facility] on 08/22/2016 for evaluation of not being able to breathe properly, recent pneumonia.   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 Monitor, no MI  * Uncontrolled Hypertension:  - With heart rate in 60s.  We will hold  off atenolol and start him on hydralazine 25 mg oral every 8 hours.  * HIV. Not on treatment for 2 years.  - Further management per infectious disease. - Due to lack of insurance.  He will need outpatient follow-up with [facility] care  * DVT prophylaxis with heparin     All the records are reviewed and case discussed with Care Management/Social Worker. Management plans discussed with the patient, family, Nephro and they are in  agreement.  CODE STATUS: Full Code  TOTAL TIME TAKING CARE OF THIS PATIENT: 35 minutes.   More than 50% of the time was spent in counseling/coordination of care: YES  POSSIBLE D/C IN 2-3 DAYS, DEPENDING ON CLINICAL CONDITION. And ID and Nephro eval   Max Sane M.D on 08/24/2016 at 10:33 AM  Between 7am to 6pm - Pager - [phone]  After 6pm go to [email or web address] - [employer] [facility]  Office  [phone]  CC: Primary care physician; Roselee Nova, MD  Note: This dictation was prepared with Dragon dictation along with smaller phrase technology. Any transcriptional errors that result from this process are unintentional.

## 2016-08-24 NOTE — Progress Notes (Signed)
[facility] INFECTIOUS DISEASE PROGRESS NOTE Date of Admission:  08/22/2016     ID: Louis [name] is a 56 y.o. male with HIV, CKD Active Problems:   AKI (acute kidney injury) ([place])   Subjective: No fevers, BP remains high   ROS  Eleven systems are reviewed and negative except per hpi  Medications:  Antibiotics Given (last 72 hours)    None     . heparin  5,000 Units Subcutaneous Q8H  . hydrALAZINE  25 mg Oral Q8H  . zolpidem  5 mg Oral QHS    Objective: Vital signs in last 24 hours: Temp:  [98.3 F (36.8 C)-98.8 F (37.1 C)] 98.8 F (37.1 C) (07/12 1217) Pulse Rate:  [60-74] 69 (07/12 1217) Resp:  [18-21] 20 (07/12 1217) BP: (149-187)/(89-114) 165/107 (07/12 1217) SpO2:  [97 %-100 %] 100 % (07/12 1217) Weight:  [85.6 kg (188 lb 11.2 oz)] 85.6 kg (188 lb 11.2 oz) (07/12 0500) Physical Exam  Constitutional: He is oriented to person, place, and time. He appears well-developed and well-nourished. No distress.  HENT:  Mouth/Throat: Oropharynx is clear and moist. No oropharyngeal exudate.  Cardiovascular: Normal rate, regular rhythm and normal heart sounds. Exam reveals no gallop and no friction rub.  No murmur heard.  Pulmonary/Chest: Effort normal and breath sounds normal. No respiratory distress. He has no wheezes.  Abdominal: Soft. Bowel sounds are normal. He exhibits no distension. There is no tenderness.  Lymphadenopathy:  He has no cervical adenopathy.  Neurological: He is alert and oriented to person, place, and time.  Skin: Skin is warm and dry. No rash noted. No erythema.  Psychiatric: He has a normal mood and affect. His behavior is normal.     Lab Results  Recent Labs  08/23/16 0614 08/24/16 0542  WBC 5.4 5.5  HGB 11.0* 10.0*  HCT 32.4* 28.8*  NA 132* 137  K 4.1 4.0  CL 107 110  CO2 17* 21*  BUN 56* 58*  CREATININE 5.23* 4.60*    Microbiology: No results found for this or any previous visit.  Studies/Results: Dg Chest 2  View  Result Date: 08/22/2016 CLINICAL DATA:  Chest discomfort, low-grade fever. EXAM: CHEST  2 VIEW COMPARISON:  Chest x-ray dated 08/29/2015. FINDINGS: Heart size and mediastinal contours are normal. Lungs are clear. No pleural effusion or pneumothorax seen. No acute or suspicious osseous finding. Old healed fracture of the left rib. Mild degenerative spurring within the thoracic spine. IMPRESSION: No active cardiopulmonary disease. No evidence of pneumonia or pulmonary edema. Electronically Signed   By: Franki Cabot M.D.   On: 08/22/2016 16:53   US Renal  Result Date: 08/22/2016 CLINICAL DATA:  Acute renal insufficiency. EXAM: RENAL / URINARY TRACT ULTRASOUND COMPLETE COMPARISON:  09/20/2012 FINDINGS: Right Kidney: Length: 13.7 cm.  Increased echogenicity.  No hydronephrosis. Left Kidney: Length: 14.4 cm. Increased echogenicity. No hydronephrosis. Lower pole left renal lesion is likely a cyst or minimally complex cyst at 9 mm. Bladder: Within normal limits.  Bilateral ureteric jets identified. Incidental note is made of mild prostatomegaly. IMPRESSION: 1. Increased renal echogenicity, which could represent medical renal disease, sickle-cell disease, HIV nephropathy, amyloid. 2. Prostatomegaly. Electronically Signed   By: Abigail Miyamoto M.D.   On: 08/22/2016 20:30    Assessment/Plan: Louis [name] is a 56 y.o. male with HIV, off meds for several years and last seen by ID in 2015 when he was on Prezista/ norvir and epizcom (due to renal insufficiency TDF was avoided at that time)  and his VL was UD and his CD4 was 410.  He states he lost his insurance and was not following with his ID physician at [facility].  HE is now admitted with CP and has elevated cr at 5 with steady increase over the last 1 year from 1.5 to 5.4.  He has 2 gm proteinuria as well as enlarged kidneys on USS. CD4 and VL are pending. Will need to add on genotype.  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 Would benefit from starting ART  ASAP but will need genotype and to get enrolled in [place] and ADAP Cr down to 4.6. HIV PCR 9320. CD4 pending Will follow Thank you very much for the consult. Will follow with you.  Dennise Raabe P   08/24/2016, 2:08 PM

## 2016-08-24 NOTE — Progress Notes (Signed)
Dr. Marcille Blanco notified of patient's persistent elevated BP; no PRN meds ordered; Acknowledged; New order written. Barbaraann Faster, RN 5:13 AM 08/24/2016

## 2016-08-24 NOTE — Progress Notes (Signed)
Date: 08/24/2016                  Patient Name:  Louis [name]  MRN: [id number]  DOB: 11/16/1960  Age / Sex: 56 y.o., male         History of Present Illness: Patient is a 56 y.o. male with medical problems of chronic kidney disease, hypertension, gout, HIV, who was admitted to [facility] on 08/22/2016 for evaluation of SOB;   He is doing fair today. Tolerated diet last evening.  BP is high today    Current medications: Current Facility-Administered Medications  Medication Dose Route Frequency Provider Last Rate Last Dose  . 0.9 %  sodium chloride infusion   Intravenous Continuous Murlean Iba, MD 10 mL/hr at 08/24/16 0945    . acetaminophen (TYLENOL) tablet 650 mg  650 mg Oral Q6H PRN Hillary Bow, MD       Or  . acetaminophen (TYLENOL) suppository 650 mg  650 mg Rectal Q6H PRN Sudini, Srikar, MD      . albuterol (PROVENTIL) (2.5 MG/3ML) 0.083% nebulizer solution 2.5 mg  2.5 mg Nebulization Q2H PRN Sudini, Alveta Heimlich, MD      . heparin injection 5,000 Units  5,000 Units Subcutaneous Q8H Murlean Iba, MD   5,000 Units at 08/23/16 2054  . hydrALAZINE (APRESOLINE) tablet 25 mg  25 mg Oral Q8H Max Sane, MD   25 mg at 08/24/16 0845  . ondansetron (ZOFRAN) tablet 4 mg  4 mg Oral Q6H PRN Hillary Bow, MD       Or  . ondansetron (ZOFRAN) injection 4 mg  4 mg Intravenous Q6H PRN Sudini, Srikar, MD      . polyethylene glycol (MIRALAX / GLYCOLAX) packet 17 g  17 g Oral Daily PRN Sudini, Alveta Heimlich, MD      . zolpidem (AMBIEN) tablet 5 mg  5 mg Oral QHS Max Sane, MD   5 mg at 08/23/16 2241     Vital Signs: Blood pressure (!) 165/107, pulse 69, temperature 98.8 F (37.1 C), temperature source Oral, resp. rate 20, height 6\' 2"  (1.88 m), weight 85.6 kg (188 lb 11.2 oz), SpO2 100 %.   Intake/Output Summary (Last 24 hours) at 08/24/16 1227 Last data filed at 08/24/16 1215  Gross per 24 hour  Intake             2126 ml  Output             1250 ml  Net              876 ml    Weight  trends: Filed Weights   08/22/16 1619 08/23/16 0500 08/24/16 0500  Weight: 91.2 kg (201 lb) 91.7 kg (202 lb 1.6 oz) 85.6 kg (188 lb 11.2 oz)    Physical Exam: General:  no acute distress, sitting up in the bed,    HEENT Anicteric, moist oral mucous membranes  Neck:  supple  Lungs: Normal breathing effort, clear to auscultation  Heart::  soft systolic murmur, regular  Abdomen: Soft, nontender, nondistended  Extremities:  no edema  Neurologic: Alert, oriented  Skin:  rash over face             Lab results: Basic Metabolic Panel:  Recent Labs Lab 08/22/16 1758 08/22/16 1931 08/23/16 0614 08/24/16 0542  NA 131*  --  132* 137  K 4.4  --  4.1 4.0  CL 106  --  107 110  CO2 20*  --  17* 21*  GLUCOSE  84  --  88 88  BUN 59*  --  56* 58*  CREATININE 5.41*  --  5.23* 4.60*  CALCIUM 8.8*  --  8.3* 8.3*  PHOS  --  5.1*  --   --     Liver Function Tests:  Recent Labs Lab 08/22/16 1758  AST 35  ALT 14*  ALKPHOS 80  BILITOT 0.5  PROT 9.3*  ALBUMIN 2.7*   No results for input(s): LIPASE, AMYLASE in the last 168 hours. No results for input(s): AMMONIA in the last 168 hours.  CBC:  Recent Labs Lab 08/22/16 1758 08/23/16 0614 08/24/16 0542  WBC 4.8 5.4 5.5  NEUTROABS 2.7  --   --   HGB 11.8* 11.0* 10.0*  HCT 34.3* 32.4* 28.8*  MCV 106.6* 105.7* 105.0*  PLT 176 162 147*    Cardiac Enzymes:  Recent Labs Lab 08/23/16 0614 08/23/16 1407  CKTOTAL  --  174  TROPONINI <0.03  --     BNP: Invalid input(s): POCBNP  CBG: No results for input(s): GLUCAP in the last 168 hours.  Microbiology: No results found for this or any previous visit (from the past 720 hour(s)).   Coagulation Studies:  Recent Labs  08/24/16 0542  LABPROT 12.3  INR 0.92    Urinalysis:  Recent Labs  08/22/16 0210  COLORURINE YELLOW*  LABSPEC 1.008  PHURINE 5.0  GLUCOSEU NEGATIVE  HGBUR MODERATE*  BILIRUBINUR NEGATIVE  KETONESUR NEGATIVE  PROTEINUR 100*  NITRITE NEGATIVE   LEUKOCYTESUR NEGATIVE        Imaging: Dg Chest 2 View  Result Date: 08/22/2016 CLINICAL DATA:  Chest discomfort, low-grade fever. EXAM: CHEST  2 VIEW COMPARISON:  Chest x-ray dated 08/29/2015. FINDINGS: Heart size and mediastinal contours are normal. Lungs are clear. No pleural effusion or pneumothorax seen. No acute or suspicious osseous finding. Old healed fracture of the left rib. Mild degenerative spurring within the thoracic spine. IMPRESSION: No active cardiopulmonary disease. No evidence of pneumonia or pulmonary edema. Electronically Signed   By: Franki Cabot M.D.   On: 08/22/2016 16:53   US Renal  Result Date: 08/22/2016 CLINICAL DATA:  Acute renal insufficiency. EXAM: RENAL / URINARY TRACT ULTRASOUND COMPLETE COMPARISON:  09/20/2012 FINDINGS: Right Kidney: Length: 13.7 cm.  Increased echogenicity.  No hydronephrosis. Left Kidney: Length: 14.4 cm. Increased echogenicity. No hydronephrosis. Lower pole left renal lesion is likely a cyst or minimally complex cyst at 9 mm. Bladder: Within normal limits.  Bilateral ureteric jets identified. Incidental note is made of mild prostatomegaly. IMPRESSION: 1. Increased renal echogenicity, which could represent medical renal disease, sickle-cell disease, HIV nephropathy, amyloid. 2. Prostatomegaly. Electronically Signed   By: Abigail Miyamoto M.D.   On: 08/22/2016 20:30     Assessment & Plan: Pt is a 56 y.o. african American  male with poorly controlled hypertension, HIV no meds for 2 years, gout,h/o NSAID use was admitted on 08/22/2016 .   1.  Acute renal failure, chronic kidney disease stage II 2.  Proteinuria 2.  Poorly controlled hypertension 4.  HIV.  No meds for 2 years 5.  Hyperuricemia, gout  Patient has progressively worsening kidney disease for the past year or so.  His baseline creatinine was 1.5/GFR 60 back in July of 2017.  Since then, his creatinine has been increasing.  His admission creatinine is 5.4.  He has proteinuria of 2 g.   Patient has history of significant nonsteroidal use.  He also has HIV and has not been taking his medication for the  past 2 years.  Differential diagnosis of worsening renal failure includes HIV nephropathy, FSGS, interstitial nephritis, NSAID-induced nephropathy, hypertension nephrosclerosis  Plan: Serologies pending.  Renal ultrasound shows relatively enlarged kidneys suspicious of HIV nephropathy.    Kidney biopsy postponed to tomorrow due to uncontrolled BP S Creatinine is improving Electrolytes and Volume status are acceptable No acute indication for Dialysis at present Would like to start ACE-I or ARB but patient is listed to be allergic to quinapril. Will need to discuss

## 2016-08-24 NOTE — Progress Notes (Signed)
Dr. Marcille Blanco notified of sustained elevated BP; Acknowledged; instructed to give Atenolol early; Barbaraann Faster, RN 6:34 AM 08/24/2016

## 2016-08-25 ENCOUNTER — Inpatient Hospital Stay: Payer: Self-pay

## 2016-08-25 LAB — BASIC METABOLIC PANEL
Anion gap: 4 — ABNORMAL LOW (ref 5–15)
BUN: 57 mg/dL — ABNORMAL HIGH (ref 6–20)
CO2: 20 mmol/L — ABNORMAL LOW (ref 22–32)
Calcium: 8.4 mg/dL — ABNORMAL LOW (ref 8.9–10.3)
Chloride: 113 mmol/L — ABNORMAL HIGH (ref 101–111)
Creatinine, Ser: 4.36 mg/dL — ABNORMAL HIGH (ref 0.61–1.24)
GFR calc Af Amer: 16 mL/min — ABNORMAL LOW (ref >60)
GFR calc non Af Amer: 14 mL/min — ABNORMAL LOW (ref >60)
Glucose, Bld: 92 mg/dL (ref 65–99)
Potassium: 4.3 mmol/L (ref 3.5–5.1)
Sodium: 137 mmol/L (ref 135–145)

## 2016-08-25 LAB — CBC
HCT: 30.6 % — ABNORMAL LOW (ref 40.0–52.0)
Hemoglobin: 10.5 g/dL — ABNORMAL LOW (ref 13.0–18.0)
MCH: 36.1 pg — ABNORMAL HIGH (ref 26.0–34.0)
MCHC: 34.4 g/dL (ref 32.0–36.0)
MCV: 104.8 fL — ABNORMAL HIGH (ref 80.0–100.0)
Platelets: 156 10*3/uL (ref 150–440)
RBC: 2.92 MIL/uL — ABNORMAL LOW (ref 4.40–5.90)
RDW: 15.6 % — ABNORMAL HIGH (ref 11.5–14.5)
WBC: 5.9 10*3/uL (ref 3.8–10.6)

## 2016-08-25 LAB — ANA W/REFLEX IF POSITIVE: Anti Nuclear Antibody(ANA): NEGATIVE

## 2016-08-25 MED ORDER — HYDRALAZINE HCL 50 MG PO TABS: 50.0000 mg | ORAL_TABLET | Freq: Four times a day (QID) | ORAL | Status: DC

## 2016-08-25 MED ORDER — AMLODIPINE BESYLATE 10 MG PO TABS
10.0000 mg | ORAL_TABLET | Freq: Every day | ORAL | Status: DC
  Administered 2016-08-25 – 2016-08-27 (×3): 10 mg via ORAL
  Filled 2016-08-25 (×3): qty 1

## 2016-08-25 MED ORDER — LABETALOL HCL 5 MG/ML IV SOLN: 5.0000 mg | INTRAVENOUS | Status: DC | PRN

## 2016-08-25 MED ORDER — HYDRALAZINE HCL 50 MG PO TABS
100.0000 mg | ORAL_TABLET | Freq: Three times a day (TID) | ORAL | Status: DC
Start: 2016-08-25 — End: 2016-08-27
  Administered 2016-08-25 – 2016-08-27 (×6): 100 mg via ORAL
  Filled 2016-08-25 (×6): qty 2

## 2016-08-25 MED ORDER — NITROGLYCERIN 2 % TD OINT
0.5000 [in_us] | TOPICAL_OINTMENT | Freq: Once | TRANSDERMAL | Status: AC
  Administered 2016-08-25: 0.5 [in_us] via TOPICAL
  Filled 2016-08-25: qty 1

## 2016-08-25 MED ORDER — HYDRALAZINE HCL 50 MG PO TABS
50.0000 mg | ORAL_TABLET | Freq: Four times a day (QID) | ORAL | Status: DC
  Administered 2016-08-25 (×2): 50 mg via ORAL
  Filled 2016-08-25 (×2): qty 1

## 2016-08-25 NOTE — Progress Notes (Signed)
Date: 08/25/2016                  Patient Name:  Louis [name]  MRN: [id number]  DOB: 01-29-1961  Age / Sex: 56 y.o., male         History of Present Illness: Patient is a 56 y.o. male with medical problems of chronic kidney disease, hypertension, gout, HIV, who was admitted to [facility] on 08/22/2016 for evaluation of SOB;   He is doing fair today. Tolerated diet last evening.  BP is high again today Added norvasc today    Current medications: Current Facility-Administered Medications  Medication Dose Route Frequency Provider Last Rate Last Dose  . 0.9 %  sodium chloride infusion   Intravenous Continuous Murlean Iba, MD   Stopped at 08/24/16 2245  . acetaminophen (TYLENOL) tablet 650 mg  650 mg Oral Q6H PRN Hillary Bow, MD   650 mg at 08/24/16 1937   Or  . acetaminophen (TYLENOL) suppository 650 mg  650 mg Rectal Q6H PRN Sudini, Alveta Heimlich, MD      . albuterol (PROVENTIL) (2.5 MG/3ML) 0.083% nebulizer solution 2.5 mg  2.5 mg Nebulization Q2H PRN Sudini, Srikar, MD      . amLODipine (NORVASC) tablet 10 mg  10 mg Oral Daily Murlean Iba, MD   10 mg at 08/25/16 0908  . heparin injection 5,000 Units  5,000 Units Subcutaneous Q8H Murlean Iba, MD   Stopped at 08/24/16 2200  . hydrALAZINE (APRESOLINE) tablet 50 mg  50 mg Oral Q6H Max Sane, MD   50 mg at 08/25/16 1143  . labetalol (NORMODYNE,TRANDATE) injection 5-10 mg  5-10 mg Intravenous Q2H PRN Harrie Foreman, MD      . ondansetron [facility]) tablet 4 mg  4 mg Oral Q6H PRN Hillary Bow, MD       Or  . ondansetron (ZOFRAN) injection 4 mg  4 mg Intravenous Q6H PRN Sudini, Srikar, MD      . polyethylene glycol (MIRALAX / GLYCOLAX) packet 17 g  17 g Oral Daily PRN Sudini, Alveta Heimlich, MD      . zolpidem (AMBIEN) tablet 5 mg  5 mg Oral QHS Max Sane, MD   5 mg at 08/24/16 2252     Vital Signs: Blood pressure (!) 161/93, pulse 67, temperature 98.3 F (36.8 C), temperature source Oral, resp. rate 18, height 6\' 2"  (1.88 m), weight  85.4 kg (188 lb 3.2 oz), SpO2 99 %.   Intake/Output Summary (Last 24 hours) at 08/25/16 1408 Last data filed at 08/24/16 1938  Gross per 24 hour  Intake              552 ml  Output              525 ml  Net               27 ml    Weight trends: Filed Weights   08/23/16 0500 08/24/16 0500 08/25/16 0604  Weight: 91.7 kg (202 lb 1.6 oz) 85.6 kg (188 lb 11.2 oz) 85.4 kg (188 lb 3.2 oz)    Physical Exam: General:  no acute distress, sitting up in the bed,    HEENT Anicteric, moist oral mucous membranes  Neck:  supple  Lungs: Normal breathing effort, clear to auscultation  Heart::  soft systolic murmur, regular  Abdomen: Soft, nontender, nondistended  Extremities:  no edema  Neurologic: Alert, oriented  Skin:  rash over face  Lab results: Basic Metabolic Panel:  Recent Labs Lab 08/22/16 1931 08/23/16 0614 08/24/16 0542 08/25/16 0525  NA  --  132* 137 137  K  --  4.1 4.0 4.3  CL  --  107 110 113*  CO2  --  17* 21* 20*  GLUCOSE  --  88 88 92  BUN  --  56* 58* 57*  CREATININE  --  5.23* 4.60* 4.36*  CALCIUM  --  8.3* 8.3* 8.4*  PHOS 5.1*  --   --   --     Liver Function Tests:  Recent Labs Lab 08/22/16 1758  AST 35  ALT 14*  ALKPHOS 80  BILITOT 0.5  PROT 9.3*  ALBUMIN 2.7*   No results for input(s): LIPASE, AMYLASE in the last 168 hours. No results for input(s): AMMONIA in the last 168 hours.  CBC:  Recent Labs Lab 08/22/16 1758 08/23/16 0614 08/24/16 0542 08/25/16 0525  WBC 4.8 3.4  5.4 5.5 5.9  NEUTROABS 2.7 1.8  --   --   HGB 11.8* 11.0*  12.8* 10.0* 10.5*  HCT 34.3* 32.4*  39.1 28.8* 30.6*  MCV 106.6* 105.7*  107* 105.0* 104.8*  PLT 176 162  179 147* 156    Cardiac Enzymes:  Recent Labs Lab 08/23/16 0614 08/23/16 1407  CKTOTAL  --  174  TROPONINI <0.03  --     BNP: Invalid input(s): POCBNP  CBG: No results for input(s): GLUCAP in the last 168 hours.  Microbiology: No results found for this or any previous  visit (from the past 720 hour(s)).   Coagulation Studies:  Recent Labs  08/24/16 0542  LABPROT 12.3  INR 0.92    Urinalysis: No results for input(s): COLORURINE, LABSPEC, PHURINE, GLUCOSEU, HGBUR, BILIRUBINUR, KETONESUR, PROTEINUR, UROBILINOGEN, NITRITE, LEUKOCYTESUR in the last 72 hours.  Invalid input(s): APPERANCEUR      Imaging: No results found.   Assessment & Plan: Pt is a 56 y.o. african American  male with poorly controlled hypertension, HIV no meds for 2 years, gout,h/o NSAID use was admitted on 08/22/2016 .   1.  Acute renal failure, chronic kidney disease stage II 2.  Proteinuria 2.  Poorly controlled hypertension 4.  HIV.  No meds for 2 years 5.  Hyperuricemia, gout  Patient has progressively worsening kidney disease for the past year or so.  His baseline creatinine was 1.5/GFR 60 back in July of 2017.  Since then, his creatinine has been increasing.  His admission creatinine is 5.4.  He has proteinuria of 2 g.  Patient has history of significant nonsteroidal use.  He also has HIV and has not been taking his medication for the past 2 years.  Differential diagnosis of worsening renal failure includes HIV nephropathy, FSGS, interstitial nephritis, NSAID-induced nephropathy, hypertension nephrosclerosis  Plan: Serologies including complements, p-ANCA, c-ANCA, ANA are negative; small M spike noted in SPEP but not UPEP  Renal ultrasound shows relatively enlarged kidneys suspicious of HIV nephropathy.    Kidney biopsy was scheduled for today but had to be postponed  due to uncontrolled BP   Electrolytes and Volume status are acceptable No acute indication for Dialysis at present Would like to start ACE-I or ARB but patient is listed to be allergic to quinapril. Will need to discuss Obtain k/l ratio, increase hydralazine dose Possibility of kidney biopsy Monday or Tuesday depending on BP control

## 2016-08-25 NOTE — Progress Notes (Signed)
[place] [facility] [place] NAME: Louis [name]    MR#:  [id number]  DATE OF BIRTH:  Nov 03, 1960  SUBJECTIVE:  CHIEF COMPLAINT:   Chief Complaint  Patient presents with  . Shortness of Breath  Kidney biopsy could not be performed yesterday as blood pressure was high, pressure continues to run high REVIEW OF SYSTEMS:  Review of Systems  Constitutional: Negative for chills, fever and weight loss.  HENT: Negative for nosebleeds and sore throat.   Eyes: Negative for blurred vision.  Respiratory: Negative for cough, shortness of breath and wheezing.   Cardiovascular: Negative for orthopnea, leg swelling and PND.  Gastrointestinal: Negative for abdominal pain, constipation, diarrhea, heartburn, nausea and vomiting.  Genitourinary: Negative for dysuria and urgency.  Musculoskeletal: Negative for back pain.  Skin: Negative for rash.  Neurological: Negative for dizziness, speech change, focal weakness and headaches.  Endo/Heme/Allergies: Does not bruise/bleed easily.  Psychiatric/Behavioral: Negative for depression.   DRUG ALLERGIES:   Allergies  Allergen Reactions  . Quinapril Hcl Swelling    Face swells   VITALS:  Blood pressure (!) 161/93, pulse 67, temperature 98.3 F (36.8 C), temperature source Oral, resp. rate 18, height 6\' 2"  (1.88 m), weight 85.4 kg (188 lb 3.2 oz), SpO2 99 %. PHYSICAL EXAMINATION:  Physical Exam  Constitutional: He is oriented to person, place, and time and well-developed, well-nourished, and in no distress.  HENT:  Head: Normocephalic and atraumatic.  Eyes: Pupils are equal, round, and reactive to light. Conjunctivae and EOM are normal.  Neck: Normal range of motion. Neck supple. No tracheal deviation present. No thyromegaly present.  Cardiovascular: Normal rate, regular rhythm and normal heart sounds.   Pulmonary/Chest: Effort normal and breath sounds normal. No respiratory distress. He has no wheezes. He  exhibits no tenderness.  Abdominal: Soft. Bowel sounds are normal. He exhibits no distension. There is no tenderness.  Musculoskeletal: Normal range of motion.  Neurological: He is alert and oriented to person, place, and time. No cranial nerve deficit.  Skin: Skin is warm and dry. No rash noted.  Psychiatric: Mood and affect normal.   LABORATORY PANEL:  Male CBC  Recent Labs Lab 08/25/16 0525  WBC 5.9  HGB 10.5*  HCT 30.6*  PLT 156   ------------------------------------------------------------------------------------------------------------------ Chemistries   Recent Labs Lab 08/22/16 1758  08/25/16 0525  NA 131*  < > 137  K 4.4  < > 4.3  CL 106  < > 113*  CO2 20*  < > 20*  GLUCOSE 84  < > 92  BUN 59*  < > 57*  CREATININE 5.41*  < > 4.36*  CALCIUM 8.8*  < > 8.4*  AST 35  --   --   ALT 14*  --   --   ALKPHOS 80  --   --   BILITOT 0.5  --   --   < > = values in this interval not displayed. RADIOLOGY:  No results found. ASSESSMENT AND PLAN:  56 y.o. male with medical problems of chronic kidney disease, hypertension, gout, HIV, who was admitted to [facility] on 08/22/2016 for evaluation of not being able to breathe properly, recent pneumonia.   * Acute on CKD stage II - Prerenal etiology with underlying HIV nephropathy - Improving with IV hydration, creatinine improved from 5.41-> 4.60->4.36 - Kidney biopsy scheduled for today, it was scheduled yesterday but due to high blood pressure this was canceled  * Left chest pain: seems pleuritic in nature. Monitor,  no MI  * Uncontrolled Hypertension:  -The blood pressure still running high we will go ahead and increase the dose of hydralazine to 50 mg every 6 hours  * HIV. Not on treatment for 2 years.  - Further management per infectious disease. - Due to lack of insurance.  He will need outpatient follow-up with [place] care  * DVT prophylaxis with heparin     All the records are reviewed and case discussed with  Care Management/Social Worker. Management plans discussed with the patient, family, Nephro and they are in agreement.  CODE STATUS: Full Code  TOTAL TIME TAKING CARE OF THIS PATIENT: 35 minutes.   More than 50% of the time was spent in counseling/coordination of care: YES  POSSIBLE D/C IN 2-3 DAYS, DEPENDING ON CLINICAL CONDITION. And ID and Nephro eval   Max Sane M.D on 08/25/2016 at 12:57 PM  Between 7am to 6pm - Pager - [phone]  After 6pm go to [email or web address] - [employer] [facility]  Office  [phone]  CC: Primary care physician; Roselee Nova, MD  Note: This dictation was prepared with Dragon dictation along with smaller phrase technology. Any transcriptional errors that result from this process are unintentional.[id number]

## 2016-08-25 NOTE — Progress Notes (Signed)
[facility] INFECTIOUS DISEASE PROGRESS NOTE Date of Admission:  08/22/2016     ID: Louis [name] is a 56 y.o. male with HIV, CKD Active Problems:   AKI (acute kidney injury) ([place])   Subjective: No complaints, BP remains high   ROS  Eleven systems are reviewed and negative except per hpi  Medications:  Antibiotics Given (last 72 hours)    None     . amLODipine  10 mg Oral Daily  . heparin  5,000 Units Subcutaneous Q8H  . hydrALAZINE  100 mg Oral TID  . zolpidem  5 mg Oral QHS    Objective: Vital signs in last 24 hours: Temp:  [98.3 F (36.8 C)-99 F (37.2 C)] 98.3 F (36.8 C) (07/13 1143) Pulse Rate:  [60-67] 67 (07/13 1143) Resp:  [18-20] 18 (07/13 0505) BP: (139-182)/(87-112) 161/93 (07/13 1143) SpO2:  [99 %-100 %] 99 % (07/13 1143) Weight:  [85.4 kg (188 lb 3.2 oz)] 85.4 kg (188 lb 3.2 oz) (07/13 0604) Physical Exam  Constitutional: He is oriented to person, place, and time. He appears well-developed and well-nourished. No distress.  HENT:  Mouth/Throat: Oropharynx is clear and moist. No oropharyngeal exudate.  Cardiovascular: Normal rate, regular rhythm and normal heart sounds. Exam reveals no gallop and no friction rub.  No murmur heard.  Pulmonary/Chest: Effort normal and breath sounds normal. No respiratory distress. He has no wheezes.  Abdominal: Soft. Bowel sounds are normal. He exhibits no distension. There is no tenderness.  Lymphadenopathy:  He has no cervical adenopathy.  Neurological: He is alert and oriented to person, place, and time.  Skin: Skin is warm and dry. No rash noted. No erythema.  Psychiatric: He has a normal mood and affect. His behavior is normal.     Lab Results  Recent Labs  08/24/16 0542 08/25/16 0525  WBC 5.5 5.9  HGB 10.0* 10.5*  HCT 28.8* 30.6*  NA 137 137  K 4.0 4.3  CL 110 113*  CO2 21* 20*  BUN 58* 57*  CREATININE 4.60* 4.36*    Microbiology: No results found for this or any previous  visit.  Studies/Results: No results found.  Assessment/Plan: Louis [name] is a 56 y.o. male with HIV, off meds for several years and last seen by ID in 2015 when he was on Prezista/ norvir and epizcom (due to renal insufficiency TDF was avoided at that time) and his VL was UD and his CD4 was 410.  He states he lost his insurance and was not following with his ID physician at [facility].  HE is now admitted with CP and has elevated cr at 5 with steady increase over the last 1 year from 1.5 to 5.4.  He has 2 gm proteinuria as well as enlarged kidneys on USS. CD4 is 137. Pending genotype.  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 Would benefit from starting ART ASAP but will need genotype and to get enrolled in [place] and ADAP Cr down to 4.3. HIV PCR 9320. CD4 pending  Technically would need PCP ppx with bactrim but would be hesitant to start given renal issues and possibility of hyperkalemia and its effects on cr which may interfere with monitor renal issues.  Will check G6 PD level tomorrow and if normal can start dapsone 100 mg qd Thank you very much for the consult. Will follow with you.  Colburn Asper P   08/25/2016, 3:46 PM

## 2016-08-26 LAB — CBC
HCT: 33.9 % — ABNORMAL LOW (ref 40.0–52.0)
Hemoglobin: 11.6 g/dL — ABNORMAL LOW (ref 13.0–18.0)
MCH: 36 pg — ABNORMAL HIGH (ref 26.0–34.0)
MCHC: 34.3 g/dL (ref 32.0–36.0)
MCV: 105.1 fL — ABNORMAL HIGH (ref 80.0–100.0)
Platelets: 169 10*3/uL (ref 150–440)
RBC: 3.23 MIL/uL — ABNORMAL LOW (ref 4.40–5.90)
RDW: 15.4 % — ABNORMAL HIGH (ref 11.5–14.5)
WBC: 5.9 10*3/uL (ref 3.8–10.6)

## 2016-08-26 LAB — BASIC METABOLIC PANEL
Anion gap: 5 (ref 5–15)
BUN: 50 mg/dL — ABNORMAL HIGH (ref 6–20)
CO2: 22 mmol/L (ref 22–32)
Calcium: 8.9 mg/dL (ref 8.9–10.3)
Chloride: 110 mmol/L (ref 101–111)
Creatinine, Ser: 4.09 mg/dL — ABNORMAL HIGH (ref 0.61–1.24)
GFR calc Af Amer: 18 mL/min — ABNORMAL LOW (ref >60)
GFR calc non Af Amer: 15 mL/min — ABNORMAL LOW (ref >60)
Glucose, Bld: 134 mg/dL — ABNORMAL HIGH (ref 65–99)
Potassium: 4 mmol/L (ref 3.5–5.1)
Sodium: 137 mmol/L (ref 135–145)

## 2016-08-26 MED ORDER — CLONIDINE HCL 0.1 MG PO TABS
0.1000 mg | ORAL_TABLET | Freq: Two times a day (BID) | ORAL | Status: DC
  Administered 2016-08-26 – 2016-08-27 (×3): 0.1 mg via ORAL
  Filled 2016-08-26 (×3): qty 1

## 2016-08-26 MED ORDER — CLONIDINE HCL 0.1 MG PO TABS: 0.2000 mg | ORAL_TABLET | Freq: Two times a day (BID) | ORAL | Status: DC

## 2016-08-26 NOTE — Progress Notes (Signed)
[place] [facility] [place] NAME: Rose Hippler    MR#:  [id number]  DATE OF BIRTH:  1960-05-22  SUBJECTIVE:  CHIEF COMPLAINT:   Chief Complaint  Patient presents with  . Shortness of Breath   Patient wants to go home denies any symptoms   REVIEW OF SYSTEMS:  Review of Systems  Constitutional: Negative for chills, fever and weight loss.  HENT: Negative for nosebleeds and sore throat.   Eyes: Negative for blurred vision.  Respiratory: Negative for cough, shortness of breath and wheezing.   Cardiovascular: Negative for orthopnea, leg swelling and PND.  Gastrointestinal: Negative for abdominal pain, constipation, diarrhea, heartburn, nausea and vomiting.  Genitourinary: Negative for dysuria and urgency.  Musculoskeletal: Negative for back pain.  Skin: Negative for rash.  Neurological: Negative for dizziness, speech change, focal weakness and headaches.  Endo/Heme/Allergies: Does not bruise/bleed easily.  Psychiatric/Behavioral: Negative for depression.   DRUG ALLERGIES:   Allergies  Allergen Reactions  . Quinapril Hcl Swelling    Face swells   VITALS:  Blood pressure (!) 155/98, pulse 73, temperature 98.8 F (37.1 C), temperature source Oral, resp. rate 18, height 6\' 2"  (1.88 m), weight 188 lb 3.2 oz (85.4 kg), SpO2 98 %. PHYSICAL EXAMINATION:  Physical Exam  Constitutional: He is oriented to person, place, and time and well-developed, well-nourished, and in no distress.  HENT:  Head: Normocephalic and atraumatic.  Eyes: Pupils are equal, round, and reactive to light. Conjunctivae and EOM are normal.  Neck: Normal range of motion. Neck supple. No tracheal deviation present. No thyromegaly present.  Cardiovascular: Normal rate, regular rhythm and normal heart sounds.   Pulmonary/Chest: Effort normal and breath sounds normal. No respiratory distress. He has no wheezes. He exhibits no tenderness.  Abdominal: Soft. Bowel sounds are  normal. He exhibits no distension. There is no tenderness.  Musculoskeletal: Normal range of motion.  Neurological: He is alert and oriented to person, place, and time. No cranial nerve deficit.  Skin: Skin is warm and dry. No rash noted.  Psychiatric: Mood and affect normal.   LABORATORY PANEL:  Male CBC  Recent Labs Lab 08/26/16 0406  WBC 5.9  HGB 11.6*  HCT 33.9*  PLT 169   ------------------------------------------------------------------------------------------------------------------ Chemistries   Recent Labs Lab 08/22/16 1758  08/26/16 0406  NA 131*  < > 137  K 4.4  < > 4.0  CL 106  < > 110  CO2 20*  < > 22  GLUCOSE 84  < > 134*  BUN 59*  < > 50*  CREATININE 5.41*  < > 4.09*  CALCIUM 8.8*  < > 8.9  AST 35  --   --   ALT 14*  --   --   ALKPHOS 80  --   --   BILITOT 0.5  --   --   < > = values in this interval not displayed. RADIOLOGY:  No results found. ASSESSMENT AND PLAN:  56 y.o. male with medical problems of chronic kidney disease, hypertension, gout, HIV, who was admitted to [facility] on 08/22/2016 for evaluation of not being able to breathe properly, recent pneumonia.   * Acute on CKD stage II - etiologyUnclear with underlying HIV nephropathy - Improving with IV hydration, - Kidney biopsy scheduled for Monday blood pressure stable  * Left chest pain: seems pleuritic in nature. Resolved  * Uncontrolled Hypertension:  -The blood pressure still running high we'll start low-dose clonidine hydralazine and Norvasc are maximized  *  HIV. Not on treatment for 2 years.  - Further management per infectious disease. - Due to lack of insurance.  He will need outpatient follow-up with [facility] care  * DVT prophylaxis with heparin     All the records are reviewed and case discussed with Care Management/Social Worker. Management plans discussed with the patient, family, Nephro and they are in agreement.  CODE STATUS: Full Code  TOTAL TIME TAKING CARE OF  THIS PATIENT: 35 minutes.   More than 50% of the time was spent in counseling/coordination of care: YES  POSSIBLE D/C IN 2-3 DAYS, DEPENDING ON CLINICAL CONDITION. And ID and Nephro eval   Dustin Flock M.D on 08/26/2016 at 12:49 PM  Between 7am to 6pm - Pager - [phone]  After 6pm go to [email or web address] - [employer] [place] [facility]  Office  [phone]  CC: Primary care physician; Roselee Nova, MD  Note: This dictation was prepared with Dragon dictation along with smaller phrase technology. Any transcriptional errors that result from this process are unintentional.[id number]

## 2016-08-26 NOTE — Progress Notes (Signed)
Date: 08/26/2016                  Patient Name:  Louis [name]  MRN: [id number]  DOB: November 20, 1960  Age / Sex: 56 y.o., male         History of Present Illness: Patient is a 56 y.o. male with medical problems of chronic kidney disease, hypertension, gout, HIV, who was admitted to [facility] on 08/22/2016 for evaluation of SOB;   He is doing fair today. Tolerated diet last evening.  BP was high therefore biopsy postponed to Monday  able to eat without nausea or vomiting    Current medications: Current Facility-Administered Medications  Medication Dose Route Frequency Provider Last Rate Last Dose  . 0.9 %  sodium chloride infusion   Intravenous Continuous Murlean Iba, MD 10 mL/hr at 08/24/16 2245 1 mL at 08/24/16 2245  . acetaminophen (TYLENOL) tablet 650 mg  650 mg Oral Q6H PRN Hillary Bow, MD   650 mg at 08/24/16 1937   Or  . acetaminophen (TYLENOL) suppository 650 mg  650 mg Rectal Q6H PRN Sudini, Alveta Heimlich, MD      . albuterol (PROVENTIL) (2.5 MG/3ML) 0.083% nebulizer solution 2.5 mg  2.5 mg Nebulization Q2H PRN Sudini, Srikar, MD      . amLODipine (NORVASC) tablet 10 mg  10 mg Oral Daily Murlean Iba, MD   10 mg at 08/26/16 0907  . cloNIDine (CATAPRES) tablet 0.1 mg  0.1 mg Oral BID Dustin Flock, MD      . heparin injection 5,000 Units  5,000 Units Subcutaneous Q8H Murlean Iba, MD   5,000 Units at 08/25/16 2157  . hydrALAZINE (APRESOLINE) tablet 100 mg  100 mg Oral TID Murlean Iba, MD   100 mg at 08/26/16 0907  . labetalol (NORMODYNE,TRANDATE) injection 5-10 mg  5-10 mg Intravenous Q2H PRN Harrie Foreman, MD      . ondansetron [facility]) tablet 4 mg  4 mg Oral Q6H PRN Hillary Bow, MD       Or  . ondansetron (ZOFRAN) injection 4 mg  4 mg Intravenous Q6H PRN Sudini, Srikar, MD      . polyethylene glycol (MIRALAX / GLYCOLAX) packet 17 g  17 g Oral Daily PRN Sudini, Alveta Heimlich, MD      . zolpidem (AMBIEN) tablet 5 mg  5 mg Oral QHS Max Sane, MD   5 mg at 08/24/16 2252      Vital Signs: Blood pressure (!) 155/98, pulse 73, temperature 98.8 F (37.1 C), temperature source Oral, resp. rate 18, height 6\' 2"  (1.88 m), weight 85.4 kg (188 lb 3.2 oz), SpO2 98 %.   Intake/Output Summary (Last 24 hours) at 08/26/16 1150 Last data filed at 08/26/16 0934  Gross per 24 hour  Intake              840 ml  Output              300 ml  Net              540 ml    Weight trends: Filed Weights   08/23/16 0500 08/24/16 0500 08/25/16 0604  Weight: 91.7 kg (202 lb 1.6 oz) 85.6 kg (188 lb 11.2 oz) 85.4 kg (188 lb 3.2 oz)    Physical Exam: General:  no acute distress, sitting up in the bed,    HEENT Anicteric, moist oral mucous membranes  Neck:  supple  Lungs: Normal breathing effort, clear to auscultation  Heart::  soft systolic murmur,  regular  Abdomen: Soft, nontender, nondistended  Extremities:  no edema  Neurologic: Alert, oriented  Skin:  rash over face             Lab results: Basic Metabolic Panel:  Recent Labs Lab 08/22/16 1931  08/24/16 0542 08/25/16 0525 08/26/16 0406  NA  --   < > 137 137 137  K  --   < > 4.0 4.3 4.0  CL  --   < > 110 113* 110  CO2  --   < > 21* 20* 22  GLUCOSE  --   < > 88 92 134*  BUN  --   < > 58* 57* 50*  CREATININE  --   < > 4.60* 4.36* 4.09*  CALCIUM  --   < > 8.3* 8.4* 8.9  PHOS 5.1*  --   --   --   --   < > = values in this interval not displayed.  Liver Function Tests:  Recent Labs Lab 08/22/16 1758  AST 35  ALT 14*  ALKPHOS 80  BILITOT 0.5  PROT 9.3*  ALBUMIN 2.7*   No results for input(s): LIPASE, AMYLASE in the last 168 hours. No results for input(s): AMMONIA in the last 168 hours.  CBC:  Recent Labs Lab 08/22/16 1758 08/23/16 0614  08/25/16 0525 08/26/16 0406  WBC 4.8 3.4  5.4  < > 5.9 5.9  NEUTROABS 2.7 1.8  --   --   --   HGB 11.8* 11.0*  12.8*  < > 10.5* 11.6*  HCT 34.3* 32.4*  39.1  < > 30.6* 33.9*  MCV 106.6* 105.7*  107*  < > 104.8* 105.1*  PLT 176 162  179  < > 156 169  <  > = values in this interval not displayed.  Cardiac Enzymes:  Recent Labs Lab 08/23/16 0614 08/23/16 1407  CKTOTAL  --  174  TROPONINI <0.03  --     BNP: Invalid input(s): POCBNP  CBG: No results for input(s): GLUCAP in the last 168 hours.  Microbiology: No results found for this or any previous visit (from the past 720 hour(s)).   Coagulation Studies:  Recent Labs  08/24/16 0542  LABPROT 12.3  INR 0.92    Urinalysis: No results for input(s): COLORURINE, LABSPEC, PHURINE, GLUCOSEU, HGBUR, BILIRUBINUR, KETONESUR, PROTEINUR, UROBILINOGEN, NITRITE, LEUKOCYTESUR in the last 72 hours.  Invalid input(s): APPERANCEUR      Imaging: No results found.   Assessment & Plan: Pt is a 56 y.o. african American  male with poorly controlled hypertension, HIV no meds for 2 years, gout,h/o NSAID use was admitted on 08/22/2016 .   1.  Acute renal failure, chronic kidney disease stage II 2.  Proteinuria 2.  Poorly controlled hypertension 4.  HIV.  No meds for 2 years 5.  Hyperuricemia, gout  Patient has progressively worsening kidney disease for the past year or so.  His baseline creatinine was 1.5/GFR 60 back in July of 2017.  Since then, his creatinine has been increasing.  His admission creatinine is 5.4.  He has proteinuria of 2 g.  Patient has history of significant nonsteroidal use.  He also has HIV and has not been taking his medication for the past 2 years.  Differential diagnosis of worsening renal failure includes HIV nephropathy, FSGS, interstitial nephritis, NSAID-induced nephropathy, hypertension nephrosclerosis  Plan: Serologies including complements, p-ANCA, c-ANCA, ANA are negative; small M spike noted in SPEP but not UPEP  Renal ultrasound shows relatively enlarged kidneys suspicious  of HIV nephropathy.    Kidney biopsy had to be postponed  due to uncontrolled BP   Electrolytes and Volume status are acceptable No acute indication for Dialysis at present Not on  ACE-I or ARB as he is allergic to ACE-i Kidney biopsy rescheduled for monday

## 2016-08-27 MED ORDER — ZOLPIDEM TARTRATE 5 MG PO TABS: 5.0000 mg | ORAL_TABLET | Freq: Every day | ORAL | 0 refills | Status: DC

## 2016-08-27 MED ORDER — AMLODIPINE BESYLATE 10 MG PO TABS: 10.0000 mg | ORAL_TABLET | Freq: Every day | ORAL | 0 refills | Status: DC

## 2016-08-27 MED ORDER — HYDRALAZINE HCL 100 MG PO TABS: 100.0000 mg | ORAL_TABLET | Freq: Three times a day (TID) | ORAL | 0 refills | Status: DC

## 2016-08-27 MED ORDER — CLONIDINE HCL 0.1 MG PO TABS: 0.1000 mg | ORAL_TABLET | Freq: Two times a day (BID) | ORAL | 11 refills | Status: DC

## 2016-08-27 NOTE — Progress Notes (Signed)
[place] at [place] was admitted to the Hospital on 08/22/2016 and Discharged  08/27/2016 and should be excused from work/school   for 5 days starting 08/22/2016 , may return to work/school without any restrictions.  Call Dustin Flock MD with questions.  Dustin Flock M.D on 08/27/2016,at 10:58 AM  [place] [facility]  [phone]

## 2016-08-27 NOTE — Discharge Summary (Signed)
[facility] - [place] at [place], 56 y.o., DOB Jul 25, 1960, MRN [id number]. 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 male with a known history of Hypertension, HIV not on treatment, tobacco abuse presents to the emergency room complaining of left-sided chest pain since Sunday. Patient was evaluated in the emergency room and was noted to have worsening renal function. Patient was admitted for further evaluation. 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 Patient was scheduled to have a biopsy of his kidneys done however due to his blood pressure being high this was canceled. Again patient was offered to have renal biopsy done this coming Monday. 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 Nephrologist feels that his HIV needs to be treated and renal function could improve. 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 He'll follow-up outpatient with them. Accelerated hypertension patient's blood pressure is much improved. For his HIV he was seen in consultation by Dr. Ola Spurr. Who recommended outpatient clinic follow-up. He check for G6PD deficiency prior to starting dapsone however that result is not back. Patient currently stable for discharge             Consults  ID, nephrology  Significant Tests:  See full reports for all  details     Dg Chest 2 View  Result Date: 08/22/2016 CLINICAL DATA:  Chest discomfort, low-grade fever. EXAM: CHEST  2 VIEW COMPARISON:  Chest x-ray dated 08/29/2015. FINDINGS: Heart size and mediastinal contours are normal. Lungs are clear. No pleural effusion or pneumothorax seen. No acute or suspicious osseous finding. Old healed fracture of the left rib. Mild degenerative spurring within the thoracic spine. IMPRESSION: No active cardiopulmonary disease. No evidence of pneumonia or pulmonary edema. Electronically Signed   By: Franki Cabot M.D.   On: 08/22/2016 16:53   US Renal  Result Date: 08/22/2016 CLINICAL DATA:  Acute renal insufficiency. EXAM: RENAL / URINARY TRACT ULTRASOUND COMPLETE COMPARISON:  09/20/2012 FINDINGS: Right Kidney: Length: 13.7 cm.  Increased echogenicity.  No hydronephrosis. Left Kidney: Length: 14.4 cm. Increased echogenicity. No hydronephrosis. Lower pole left renal lesion is likely a cyst or minimally complex cyst at 9 mm. Bladder: Within normal limits.  Bilateral ureteric jets identified. Incidental note is made of mild prostatomegaly. IMPRESSION: 1. Increased renal echogenicity, which could represent medical renal disease, sickle-cell disease, HIV nephropathy, amyloid. 2. Prostatomegaly. Electronically Signed   By: Abigail Miyamoto M.D.   On: 08/22/2016 20:30       Today   Subjective:   Louis [name]  feels well denies any complaints no chest pain or shortness of  Objective:   Blood pressure 127/84, pulse 77, temperature 99 F (37.2 C), temperature source Oral, resp. rate 18, height 6\' 2"  (1.88 m), weight 186 lb 11.2 oz (84.7  kg), SpO2 100 %.  .  Intake/Output Summary (Last 24 hours) at 08/27/16 1242 Last data filed at 08/27/16 0900  Gross per 24 hour  Intake              240 ml  Output                0 ml  Net              240 ml    Exam VITAL SIGNS: Blood pressure 127/84, pulse 77, temperature 99 F (37.2 C), temperature source Oral, resp. rate 18,  height 6\' 2"  (1.88 m), weight 186 lb 11.2 oz (84.7 kg), SpO2 100 %.  GENERAL:  56 y.o.-year-old patient lying in the bed with no acute distress.  EYES: Pupils equal, round, reactive to light and accommodation. No scleral icterus. Extraocular muscles intact.  HEENT: Head atraumatic, normocephalic. Oropharynx and nasopharynx clear.  NECK:  Supple, no jugular venous distention. No thyroid enlargement, no tenderness.  LUNGS: Normal breath sounds bilaterally, no wheezing, rales,rhonchi or crepitation. No use of accessory muscles of respiration.  CARDIOVASCULAR: S1, S2 normal. No murmurs, rubs, or gallops.  ABDOMEN: Soft, nontender, nondistended. Bowel sounds present. No organomegaly or mass.  EXTREMITIES: No pedal edema, cyanosis, or clubbing.  NEUROLOGIC: Cranial nerves II through XII are intact. Muscle strength 5/5 in all extremities. Sensation intact. Gait not checked.  PSYCHIATRIC: The patient is alert and oriented x 3.  SKIN: No obvious rash, lesion, or ulcer.   Data Review     CBC w Diff: Lab Results  Component Value Date   WBC 5.9 08/26/2016   HGB 11.6 (L) 08/26/2016   HGB 12.8 (L) 08/23/2016   HCT 33.9 (L) 08/26/2016   HCT 39.1 08/23/2016   PLT 169 08/26/2016   PLT 179 08/23/2016   LYMPHOPCT 30 08/22/2016   MONOPCT 10 08/22/2016   EOSPCT 4 08/22/2016   BASOPCT 0 08/22/2016   CMP: Lab Results  Component Value Date   NA 137 08/26/2016   K 4.0 08/26/2016   CL 110 08/26/2016   CO2 22 08/26/2016   BUN 50 (H) 08/26/2016   CREATININE 4.09 (H) 08/26/2016   CREATININE 3.26 (H) 05/11/2016   PROT 9.3 (H) 08/22/2016   ALBUMIN 2.7 (L) 08/22/2016   BILITOT 0.5 08/22/2016   ALKPHOS 80 08/22/2016   AST 35 08/22/2016   ALT 14 (L) 08/22/2016  .  Micro Results No results found for this or any previous visit (from the past 240 hour(s)).      Code Status Orders        Start     Ordered   08/22/16 1929  Full code  Continuous     08/22/16 1929    Code Status History     Date Active Date Inactive Code Status Order ID Comments User Context   This patient has a current code status but no historical code status.          Follow-up Information    Murlean Iba, MD Follow up in 1 week(s).   Specialty:  Internal Medicine Why:  renal failure Contact information: [address] [place] [address] [phone]        hiv clinic f/u per dr. Delora Fuel Follow up in 1 week(s).           Discharge Medications   Allergies as of 08/27/2016      Reactions   Quinapril Hcl Swelling   Face swells      Medication  List    STOP taking these medications   abacavir-lamiVUDine 600-300 MG tablet Commonly known as:  EPZICOM   darunavir 800 MG tablet Commonly known as:  PREZISTA   doxycycline 100 MG capsule Commonly known as:  VIBRAMYCIN   hydrochlorothiazide 25 MG tablet Commonly known as:  HYDRODIURIL   ritonavir 100 MG Tabs tablet Commonly known as:  NORVIR   valACYclovir 500 MG tablet Commonly known as:  VALTREX     TAKE these medications   amLODipine 10 MG tablet Commonly known as:  NORVASC Take 1 tablet (10 mg total) by mouth daily.   atenolol 50 MG tablet Commonly known as:  TENORMIN TAKE 1 TABLET (50 MG TOTAL) BY MOUTH 2 (TWO) TIMES DAILY. What changed:  how much to take   cloNIDine 0.1 MG tablet Commonly known as:  CATAPRES Take 1 tablet (0.1 mg total) by mouth 2 (two) times daily.   colchicine 0.6 MG tablet Take 1 tablet (0.6 mg total) by mouth daily as needed.   desoximetasone 0.25 % cream Commonly known as:  TOPICORT Apply 1 application topically 2 (two) times daily.   hydrALAZINE 100 MG tablet Commonly known as:  APRESOLINE Take 1 tablet (100 mg total) by mouth 3 (three) times daily.   hydrOXYzine 50 MG tablet Commonly known as:  ATARAX/VISTARIL Take 1 tablet (50 mg total) by mouth 3 (three) times daily as needed. What changed:  how much to take  when to take this   zolpidem 5 MG  tablet Commonly known as:  AMBIEN Take 1 tablet (5 mg total) by mouth at bedtime.          Total Time in preparing paper work, data evaluation and todays exam - 35 minutes  Dustin Flock M.D on 08/27/2016 at 12:42 PM  [facility]   Office  [phone]

## 2016-08-27 NOTE — Care Management Note (Addendum)
Case Management Note  Patient Details  Name: Louis Louis [name] MRN: [id number] Date of Birth: [birth year]  Subjective/Objective:    Discussed discharge planning with Louis Louis [name]. He has Louis [name] PCP but needs assistance with medications per being uninsured. Provided Louis Louis [name] with applications to [facility], and provided him with Louis [name] MATCH coupon for medication assistance. Louis Louis [name] verbalized understanding to only get todays prescriptions filled at one of the pharmacies listed on his [place] coupon. No other discharge needs identified.               Action/Plan:   Expected Discharge Date:  08/27/16               Expected Discharge Plan:   0715/18  In-House Referral:     Discharge planning Services   [facility]. Match program.   Post Acute Care Choice:    Choice offered to:     DME Arranged:   NA DME Agency:     HH Arranged:   NA HH Agency:     Status of Service:    Completed If discussed at [place] of Stay Meetings, dates discussed:    Additional Comments:  Louis Kester A, RN 08/27/2016, 11:42 AM

## 2016-08-27 NOTE — Progress Notes (Signed)
Date: 08/27/2016                  Patient Name:  Louis [name]  MRN: [id number]  DOB: 1960-10-15  Age / Sex: 56 y.o., male         History of Present Illness: Patient is a 56 y.o. male with medical problems of chronic kidney disease, hypertension, gout, HIV, who was admitted to [facility] on 08/22/2016 for evaluation of SOB;   He is doing fair today. Blood pressure is much better controlled. Able to tolerate diet without nausea or vomiting     Current medications: Current Facility-Administered Medications  Medication Dose Route Frequency Provider Last Rate Last Dose  . acetaminophen (TYLENOL) tablet 650 mg  650 mg Oral Q6H PRN Hillary Bow, MD   650 mg at 08/24/16 1937   Or  . acetaminophen (TYLENOL) suppository 650 mg  650 mg Rectal Q6H PRN Sudini, Alveta Heimlich, MD      . albuterol (PROVENTIL) (2.5 MG/3ML) 0.083% nebulizer solution 2.5 mg  2.5 mg Nebulization Q2H PRN Sudini, Srikar, MD      . amLODipine (NORVASC) tablet 10 mg  10 mg Oral Daily Murlean Iba, MD   10 mg at 08/27/16 0923  . cloNIDine (CATAPRES) tablet 0.1 mg  0.1 mg Oral BID Dustin Flock, MD   0.1 mg at 08/27/16 0923  . heparin injection 5,000 Units  5,000 Units Subcutaneous Q8H Murlean Iba, MD   5,000 Units at 08/27/16 0516  . hydrALAZINE (APRESOLINE) tablet 100 mg  100 mg Oral TID Murlean Iba, MD   100 mg at 08/27/16 0923  . labetalol (NORMODYNE,TRANDATE) injection 5-10 mg  5-10 mg Intravenous Q2H PRN Harrie Foreman, MD      . ondansetron [facility]) tablet 4 mg  4 mg Oral Q6H PRN Hillary Bow, MD       Or  . ondansetron (ZOFRAN) injection 4 mg  4 mg Intravenous Q6H PRN Sudini, Srikar, MD      . polyethylene glycol (MIRALAX / GLYCOLAX) packet 17 g  17 g Oral Daily PRN Sudini, Alveta Heimlich, MD      . zolpidem (AMBIEN) tablet 5 mg  5 mg Oral QHS Max Sane, MD   5 mg at 08/24/16 2252     Vital Signs: Blood pressure 127/84, pulse 77, temperature 99 F (37.2 C), temperature source Oral, resp. rate 18, height 6\' 2"   (1.88 m), weight 84.7 kg (186 lb 11.2 oz), SpO2 100 %.   Intake/Output Summary (Last 24 hours) at 08/27/16 1137 Last data filed at 08/27/16 0900  Gross per 24 hour  Intake              240 ml  Output                0 ml  Net              240 ml    Weight trends: Filed Weights   08/24/16 0500 08/25/16 0604 08/27/16 0411  Weight: 85.6 kg (188 lb 11.2 oz) 85.4 kg (188 lb 3.2 oz) 84.7 kg (186 lb 11.2 oz)    Physical Exam: General:  no acute distress, sitting up in the bed,    HEENT Anicteric, moist oral mucous membranes  Neck:  supple  Lungs: Normal breathing effort, clear to auscultation  Heart::  soft systolic murmur, regular  Abdomen: Soft, nontender, nondistended  Extremities:  no edema  Neurologic: Alert, oriented  Skin:  rash over face  Lab results: Basic Metabolic Panel:  Recent Labs Lab 08/22/16 1931  08/24/16 0542 08/25/16 0525 08/26/16 0406  NA  --   < > 137 137 137  K  --   < > 4.0 4.3 4.0  CL  --   < > 110 113* 110  CO2  --   < > 21* 20* 22  GLUCOSE  --   < > 88 92 134*  BUN  --   < > 58* 57* 50*  CREATININE  --   < > 4.60* 4.36* 4.09*  CALCIUM  --   < > 8.3* 8.4* 8.9  PHOS 5.1*  --   --   --   --   < > = values in this interval not displayed.  Liver Function Tests:  Recent Labs Lab 08/22/16 1758  AST 35  ALT 14*  ALKPHOS 80  BILITOT 0.5  PROT 9.3*  ALBUMIN 2.7*   No results for input(s): LIPASE, AMYLASE in the last 168 hours. No results for input(s): AMMONIA in the last 168 hours.  CBC:  Recent Labs Lab 08/22/16 1758 08/23/16 0614  08/25/16 0525 08/26/16 0406  WBC 4.8 3.4  5.4  < > 5.9 5.9  NEUTROABS 2.7 1.8  --   --   --   HGB 11.8* 11.0*  12.8*  < > 10.5* 11.6*  HCT 34.3* 32.4*  39.1  < > 30.6* 33.9*  MCV 106.6* 105.7*  107*  < > 104.8* 105.1*  PLT 176 162  179  < > 156 169  < > = values in this interval not displayed.  Cardiac Enzymes:  Recent Labs Lab 08/23/16 0614 08/23/16 1407  CKTOTAL  --  174   TROPONINI <0.03  --     BNP: Invalid input(s): POCBNP  CBG: No results for input(s): GLUCAP in the last 168 hours.  Microbiology: No results found for this or any previous visit (from the past 720 hour(s)).   Coagulation Studies: No results for input(s): LABPROT, INR in the last 72 hours.  Urinalysis: No results for input(s): COLORURINE, LABSPEC, PHURINE, GLUCOSEU, HGBUR, BILIRUBINUR, KETONESUR, PROTEINUR, UROBILINOGEN, NITRITE, LEUKOCYTESUR in the last 72 hours.  Invalid input(s): APPERANCEUR      Imaging: No results found.   Assessment & Plan: Pt is a 56 y.o. african American  male with poorly controlled hypertension, HIV no meds for 2 years, gout,h/o NSAID use was admitted on 08/22/2016 .   1.  Acute renal failure, CKD stage 2 2.  Proteinuria 2.  Poorly controlled hypertension 4.  HIV.  No meds for 2 years 5.  Hyperuricemia, gout  Patient has progressively worsening kidney disease for the past year or so.  His baseline creatinine was 1.5/GFR 60 back in July of 2017.  Since then, his creatinine has been increasing.  His admission creatinine is 5.4.  He has proteinuria of 2 g.  Patient has history of significant nonsteroidal use.  He also has HIV and has not been taking his medication for the past 2 years.  Differential diagnosis of worsening renal failure includes HIV nephropathy, FSGS, interstitial nephritis, NSAID-induced nephropathy, hypertension nephrosclerosis  Plan: Serologies including complements, p-ANCA, c-ANCA, ANA are negative; small M spike noted in SPEP but not UPEP  Renal ultrasound shows relatively enlarged kidneys suspicious of HIV nephropathy.    Kidney biopsy had to be postponed  due to uncontrolled BP   Electrolytes and Volume status are acceptable No acute indication for Dialysis at present Not on ACE-I or ARB as  he is allergic to ACE-i Kidney biopsy rescheduled for monday, but patient states that he is not able to take time off from work. After  biopsy, he will be required to avoid heavy lifting for 2 weeks. Patient is not willing to do that at this time. His blood pressure is better controlled. He will be restarted on HIV medications as outpatient. We will follow-up in the office and assess response to HIV medications.

## 2016-08-29 LAB — KAPPA/LAMBDA LIGHT CHAINS
Kappa free light chain: 576.5 mg/L — ABNORMAL HIGH (ref 3.3–19.4)
Kappa, lambda light chain ratio: 1.49 (ref 0.26–1.65)
Lambda free light chains: 388.1 mg/L — ABNORMAL HIGH (ref 5.7–26.3)

## 2016-08-30 ENCOUNTER — Emergency Department
Admission: EM | Admit: 2016-08-30 | Discharge: 2016-08-30 | Disposition: A | Discharge status: Home / Self Care | Payer: Self-pay | Attending: Emergency Medicine | Admitting: Emergency Medicine

## 2016-08-30 DIAGNOSIS — Z5321 Procedure and treatment not carried out due to patient leaving prior to being seen by health care provider: Secondary | ICD-10-CM | POA: Insufficient documentation

## 2016-08-30 DIAGNOSIS — R799 Abnormal finding of blood chemistry, unspecified: Secondary | ICD-10-CM | POA: Insufficient documentation

## 2016-08-30 LAB — BASIC METABOLIC PANEL
Anion gap: 10 (ref 5–15)
BUN: 72 mg/dL — ABNORMAL HIGH (ref 6–20)
CO2: 17 mmol/L — ABNORMAL LOW (ref 22–32)
Calcium: 8.7 mg/dL — ABNORMAL LOW (ref 8.9–10.3)
Chloride: 105 mmol/L (ref 101–111)
Creatinine, Ser: 5.94 mg/dL — ABNORMAL HIGH (ref 0.61–1.24)
GFR calc Af Amer: 11 mL/min — ABNORMAL LOW (ref >60)
GFR calc non Af Amer: 10 mL/min — ABNORMAL LOW (ref >60)
Glucose, Bld: 88 mg/dL (ref 65–99)
Potassium: 4.2 mmol/L (ref 3.5–5.1)
Sodium: 132 mmol/L — ABNORMAL LOW (ref 135–145)

## 2016-08-30 LAB — CBC
HCT: 31 % — ABNORMAL LOW (ref 40.0–52.0)
Hemoglobin: 10.5 g/dL — ABNORMAL LOW (ref 13.0–18.0)
MCH: 35.4 pg — ABNORMAL HIGH (ref 26.0–34.0)
MCHC: 33.9 g/dL (ref 32.0–36.0)
MCV: 104.5 fL — ABNORMAL HIGH (ref 80.0–100.0)
Platelets: 281 10*3/uL (ref 150–440)
RBC: 2.97 MIL/uL — ABNORMAL LOW (ref 4.40–5.90)
RDW: 14.7 % — ABNORMAL HIGH (ref 11.5–14.5)
WBC: 4.4 10*3/uL (ref 3.8–10.6)

## 2016-08-30 NOTE — ED Notes (Signed)
Received call from Dr Milana Na states he will see pt in office in am and does not need to be seen in ED  Informed pt

## 2016-08-30 NOTE — ED Triage Notes (Signed)
Pt presents to ED for abnormal kidney function. Pt was seen in ED last week and admitted for AKI. Pt states labs improved when he was admitted. States his doctor told him to come back here today to see the doctor he saw while he was admitted about his kidney function. No distress noted.

## 2016-08-30 NOTE — ED Notes (Signed)
First nurse note  Sent in by PCP for abnormal kidney function

## 2016-08-31 LAB — GLUCOSE 6 PHOSPHATE DEHYDROGENASE
G6PDH: 8.4 U/g{Hb} (ref 4.6–13.5)
Hemoglobin: 11.5 g/dL — ABNORMAL LOW (ref 13.0–17.7)

## 2016-09-13 LAB — MISC LABCORP TEST (SEND OUT): Labcorp test code: 551700

## 2016-09-21 ENCOUNTER — Other Ambulatory Visit: Payer: Self-pay | Admitting: Family Medicine

## 2016-09-21 DIAGNOSIS — I10 Essential (primary) hypertension: Secondary | ICD-10-CM

## 2016-09-21 NOTE — Telephone Encounter (Signed)
HCTZ not on patient's current medication list, and has not been prescribed by PCP Dr. Manuella Ghazi since February 2018.

## 2016-10-08 ENCOUNTER — Emergency Department: Payer: Self-pay

## 2016-10-08 ENCOUNTER — Encounter: Payer: Self-pay | Admitting: Oncology

## 2016-10-08 ENCOUNTER — Inpatient Hospital Stay: Payer: Self-pay

## 2016-10-08 ENCOUNTER — Inpatient Hospital Stay
Admission: EM | Admit: 2016-10-08 | Discharge: 2016-10-11 | DRG: 673 | Disposition: A | Discharge status: Home / Self Care | Payer: Self-pay | Attending: Internal Medicine | Admitting: Internal Medicine

## 2016-10-08 DIAGNOSIS — N2581 Secondary hyperparathyroidism of renal origin: Secondary | ICD-10-CM | POA: Diagnosis present

## 2016-10-08 DIAGNOSIS — N29 Other disorders of kidney and ureter in diseases classified elsewhere: Secondary | ICD-10-CM | POA: Diagnosis present

## 2016-10-08 DIAGNOSIS — N186 End stage renal disease: Secondary | ICD-10-CM | POA: Diagnosis present

## 2016-10-08 DIAGNOSIS — R600 Localized edema: Secondary | ICD-10-CM | POA: Diagnosis present

## 2016-10-08 DIAGNOSIS — M7989 Other specified soft tissue disorders: Secondary | ICD-10-CM | POA: Diagnosis present

## 2016-10-08 DIAGNOSIS — M79669 Pain in unspecified lower leg: Secondary | ICD-10-CM

## 2016-10-08 DIAGNOSIS — D631 Anemia in chronic kidney disease: Secondary | ICD-10-CM | POA: Diagnosis present

## 2016-10-08 DIAGNOSIS — B20 Human immunodeficiency virus [HIV] disease: Secondary | ICD-10-CM | POA: Diagnosis present

## 2016-10-08 DIAGNOSIS — R609 Edema, unspecified: Secondary | ICD-10-CM

## 2016-10-08 DIAGNOSIS — Z79899 Other long term (current) drug therapy: Secondary | ICD-10-CM

## 2016-10-08 DIAGNOSIS — N179 Acute kidney failure, unspecified: Principal | ICD-10-CM | POA: Diagnosis present

## 2016-10-08 DIAGNOSIS — M109 Gout, unspecified: Secondary | ICD-10-CM | POA: Diagnosis present

## 2016-10-08 DIAGNOSIS — N184 Chronic kidney disease, stage 4 (severe): Secondary | ICD-10-CM

## 2016-10-08 DIAGNOSIS — I120 Hypertensive chronic kidney disease with stage 5 chronic kidney disease or end stage renal disease: Secondary | ICD-10-CM | POA: Diagnosis present

## 2016-10-08 DIAGNOSIS — E8770 Fluid overload, unspecified: Secondary | ICD-10-CM | POA: Diagnosis present

## 2016-10-08 DIAGNOSIS — T39395A Adverse effect of other nonsteroidal anti-inflammatory drugs [NSAID], initial encounter: Secondary | ICD-10-CM | POA: Diagnosis present

## 2016-10-08 DIAGNOSIS — Z992 Dependence on renal dialysis: Secondary | ICD-10-CM

## 2016-10-08 DIAGNOSIS — Z888 Allergy status to other drugs, medicaments and biological substances status: Secondary | ICD-10-CM

## 2016-10-08 DIAGNOSIS — Z9114 Patient's other noncompliance with medication regimen: Secondary | ICD-10-CM

## 2016-10-08 LAB — COMPREHENSIVE METABOLIC PANEL
ALT: 11 U/L — ABNORMAL LOW (ref 17–63)
AST: 28 U/L (ref 15–41)
Albumin: 2.7 g/dL — ABNORMAL LOW (ref 3.5–5.0)
Alkaline Phosphatase: 54 U/L (ref 38–126)
Anion gap: 9 (ref 5–15)
BUN: 94 mg/dL — ABNORMAL HIGH (ref 6–20)
CO2: 21 mmol/L — ABNORMAL LOW (ref 22–32)
Calcium: 8.4 mg/dL — ABNORMAL LOW (ref 8.9–10.3)
Chloride: 109 mmol/L (ref 101–111)
Creatinine, Ser: 7.54 mg/dL — ABNORMAL HIGH (ref 0.61–1.24)
GFR calc Af Amer: 8 mL/min — ABNORMAL LOW (ref >60)
GFR calc non Af Amer: 7 mL/min — ABNORMAL LOW (ref >60)
Glucose, Bld: 93 mg/dL (ref 65–99)
Potassium: 3.8 mmol/L (ref 3.5–5.1)
Sodium: 139 mmol/L (ref 135–145)
Total Bilirubin: 0.2 mg/dL — ABNORMAL LOW (ref 0.3–1.2)
Total Protein: 7.9 g/dL (ref 6.5–8.1)

## 2016-10-08 LAB — URINALYSIS, COMPLETE (UACMP) WITH MICROSCOPIC
Bacteria, UA: NONE SEEN
Bilirubin Urine: NEGATIVE
Glucose, UA: NEGATIVE mg/dL
Ketones, ur: NEGATIVE mg/dL
Leukocytes, UA: NEGATIVE
Nitrite: NEGATIVE
Protein, ur: 100 mg/dL — AB
Specific Gravity, Urine: 1.009 (ref 1.005–1.030)
Squamous Epithelial / LPF: NONE SEEN
pH: 5 (ref 5.0–8.0)

## 2016-10-08 LAB — CBC WITH DIFFERENTIAL/PLATELET
Basophils Absolute: 0 10*3/uL (ref 0–0.1)
Basophils Relative: 0 %
Eosinophils Absolute: 0.2 10*3/uL (ref 0–0.7)
Eosinophils Relative: 4 %
HCT: 26.1 % — ABNORMAL LOW (ref 40.0–52.0)
Hemoglobin: 8.9 g/dL — ABNORMAL LOW (ref 13.0–18.0)
Lymphocytes Relative: 34 %
Lymphs Abs: 1.5 10*3/uL (ref 1.0–3.6)
MCH: 33.9 pg (ref 26.0–34.0)
MCHC: 34.1 g/dL (ref 32.0–36.0)
MCV: 99.4 fL (ref 80.0–100.0)
Monocytes Absolute: 0.5 10*3/uL (ref 0.2–1.0)
Monocytes Relative: 11 %
Neutro Abs: 2.2 10*3/uL (ref 1.4–6.5)
Neutrophils Relative %: 51 %
Platelets: 196 10*3/uL (ref 150–440)
RBC: 2.62 MIL/uL — ABNORMAL LOW (ref 4.40–5.90)
RDW: 14 % (ref 11.5–14.5)
WBC: 4.3 10*3/uL (ref 3.8–10.6)

## 2016-10-08 LAB — SURGICAL PCR SCREEN
MRSA, PCR: NEGATIVE
Staphylococcus aureus: NEGATIVE

## 2016-10-08 LAB — BRAIN NATRIURETIC PEPTIDE: B Natriuretic Peptide: 303 pg/mL — ABNORMAL HIGH (ref 0.0–100.0)

## 2016-10-08 LAB — TROPONIN I: Troponin I: <0.03 ng/mL (ref <0.03)

## 2016-10-08 MED ORDER — HEPARIN SODIUM (PORCINE) 5000 UNIT/ML IJ SOLN
5000.0000 [IU] | Freq: Three times a day (TID) | INTRAMUSCULAR | Status: DC
  Administered 2016-10-08 – 2016-10-11 (×8): 5000 [IU] via SUBCUTANEOUS
  Filled 2016-10-08 (×8): qty 1

## 2016-10-08 MED ORDER — CLONIDINE HCL 0.1 MG PO TABS
0.1000 mg | ORAL_TABLET | Freq: Two times a day (BID) | ORAL | Status: DC
  Administered 2016-10-08 – 2016-10-11 (×5): 0.1 mg via ORAL
  Filled 2016-10-08 (×5): qty 1

## 2016-10-08 MED ORDER — ATENOLOL 50 MG PO TABS
50.0000 mg | ORAL_TABLET | Freq: Two times a day (BID) | ORAL | Status: DC
  Administered 2016-10-08 – 2016-10-11 (×4): 50 mg via ORAL
  Filled 2016-10-08 (×4): qty 1
  Filled 2016-10-08: qty 2
  Filled 2016-10-08 (×3): qty 1

## 2016-10-08 MED ORDER — HYDROXYZINE HCL 25 MG PO TABS
50.0000 mg | ORAL_TABLET | Freq: Three times a day (TID) | ORAL | Status: DC | PRN
  Administered 2016-10-08: 50 mg via ORAL
  Filled 2016-10-08: qty 2

## 2016-10-08 MED ORDER — ONDANSETRON HCL 4 MG/2ML IJ SOLN: 4.0000 mg | Freq: Four times a day (QID) | INTRAMUSCULAR | Status: DC | PRN

## 2016-10-08 MED ORDER — TUBERCULIN PPD 5 UNIT/0.1ML ID SOLN
5.0000 [IU] | Freq: Once | INTRADERMAL | Status: AC
  Administered 2016-10-08: 5 [IU] via INTRADERMAL
  Filled 2016-10-08: qty 0.1

## 2016-10-08 MED ORDER — ACETAMINOPHEN 650 MG RE SUPP: 650.0000 mg | Freq: Four times a day (QID) | RECTAL | Status: DC | PRN

## 2016-10-08 MED ORDER — ENOXAPARIN SODIUM 30 MG/0.3ML ~~LOC~~ SOLN: 30.0000 mg | SUBCUTANEOUS | Status: DC

## 2016-10-08 MED ORDER — POLYETHYLENE GLYCOL 3350 17 G PO PACK: 17.0000 g | PACK | Freq: Every day | ORAL | Status: DC | PRN

## 2016-10-08 MED ORDER — LORATADINE 10 MG PO TABS: 10.0000 mg | ORAL_TABLET | Freq: Every day | ORAL | Status: DC | PRN

## 2016-10-08 MED ORDER — ACETAMINOPHEN 325 MG PO TABS
650.0000 mg | ORAL_TABLET | Freq: Four times a day (QID) | ORAL | Status: DC | PRN
  Administered 2016-10-10 (×2): 650 mg via ORAL
  Filled 2016-10-08 (×2): qty 2

## 2016-10-08 MED ORDER — AMLODIPINE BESYLATE 10 MG PO TABS
10.0000 mg | ORAL_TABLET | Freq: Every day | ORAL | Status: DC
  Administered 2016-10-08 – 2016-10-11 (×2): 10 mg via ORAL
  Filled 2016-10-08 (×3): qty 1

## 2016-10-08 MED ORDER — HYDRALAZINE HCL 20 MG/ML IJ SOLN: 5.0000 mg | Freq: Four times a day (QID) | INTRAMUSCULAR | Status: DC | PRN

## 2016-10-08 MED ORDER — ZOLPIDEM TARTRATE 5 MG PO TABS
5.0000 mg | ORAL_TABLET | Freq: Every day | ORAL | Status: DC
  Administered 2016-10-08 – 2016-10-10 (×3): 5 mg via ORAL
  Filled 2016-10-08 (×4): qty 1

## 2016-10-08 MED ORDER — ONDANSETRON HCL 4 MG PO TABS: 4.0000 mg | ORAL_TABLET | Freq: Four times a day (QID) | ORAL | Status: DC | PRN

## 2016-10-08 MED ORDER — CEFAZOLIN SODIUM-DEXTROSE 1-4 GM/50ML-% IV SOLN
1.0000 g | Freq: Once | INTRAVENOUS | Status: DC
  Filled 2016-10-08: qty 50

## 2016-10-08 MED ORDER — HYDRALAZINE HCL 50 MG PO TABS
100.0000 mg | ORAL_TABLET | Freq: Three times a day (TID) | ORAL | Status: DC
  Administered 2016-10-08 – 2016-10-11 (×6): 100 mg via ORAL
  Filled 2016-10-08 (×7): qty 2

## 2016-10-08 NOTE — ED Notes (Signed)
Patient transported to X-ray at this time 

## 2016-10-08 NOTE — Progress Notes (Signed)
[facility], [place] 10/08/16  Subjective:   Patient is known to our practice from previous admission and outpatient. He has advanced chronic kidney disease, hypertension, gout, HIV,  He presents for worsening lower extremity edema and poorly controlled hypertension. His blood pressure is 180/110. Admission labs show creatinine of 7.54, BUN 94, albumin 2.7, hemoglobin 8.9 Nephrology consult for evaluation of starting dialysis.  Objective:  Vital signs in last 24 hours:  Pulse Rate:  [56] 56 (08/26 0944) Resp:  [17] 17 (08/26 0944) BP: (180)/(110) 180/110 (08/26 0944) SpO2:  [95 %] 95 % (08/26 0944)  Weight change:  There were no vitals filed for this visit.  Intake/Output:    Intake/Output Summary (Last 24 hours) at 10/08/16 1154 Last data filed at 10/08/16 1007  Gross per 24 hour  Intake                0 ml  Output              175 ml  Net             -175 ml    Physical Exam: General:  no acute distress, laying in the bed,    HEENT Anicteric, moist oral mucous membranes  Neck:  supple  Lungs: Normal breathing effort, clear to auscultation  Heart::  soft systolic murmur, regular  Abdomen: Soft, nontender, nondistended  Extremities:  2+ edema  Neurologic: Alert, oriented  Skin:  no acute lesions      Basic Metabolic Panel:   Recent Labs Lab 10/08/16 0901  NA 139  K 3.8  CL 109  CO2 21*  GLUCOSE 93  BUN 94*  CREATININE 7.54*  CALCIUM 8.4*     CBC:  Recent Labs Lab 10/08/16 0901  WBC 4.3  NEUTROABS 2.2  HGB 8.9*  HCT 26.1*  MCV 99.4  PLT 196     Lab Results  Component Value Date   HEPBSAG Negative 08/23/2016   HEPBSAB Non Reactive 08/23/2016   HEPBIGM Negative 08/23/2016      Microbiology:  No results found for this or any previous visit (from the past 240 hour(s)).  Coagulation Studies: No results for input(s): LABPROT, INR in the last 72 hours.  Urinalysis:  Recent Labs  10/08/16 1002   COLORURINE YELLOW*  LABSPEC 1.009  PHURINE 5.0  GLUCOSEU NEGATIVE  HGBUR SMALL*  BILIRUBINUR NEGATIVE  KETONESUR NEGATIVE  PROTEINUR 100*  NITRITE NEGATIVE  LEUKOCYTESUR NEGATIVE      Imaging: Dg Chest 2 View  Result Date: 10/08/2016 CLINICAL DATA:  Bilateral leg swelling. EXAM: CHEST  2 VIEW COMPARISON:  08/22/2016 FINDINGS: Old left seventh rib fracture. Few densities along the left cardiac border probably represent overlying densities. No focal airspace disease or pulmonary edema. Heart and mediastinum are within normal limits. No pleural effusions. IMPRESSION: No active cardiopulmonary disease. Electronically Signed   By: Markus Daft M.D.   On: 10/08/2016 09:42     Medications:   Current Meds  Medication Sig  . amLODipine (NORVASC) 10 MG tablet Take 1 tablet (10 mg total) by mouth daily.  Marland Kitchen atenolol (TENORMIN) 50 MG tablet Take 50 mg by mouth 2 (two) times daily.  . cloNIDine (CATAPRES) 0.1 MG tablet Take 1 tablet (0.1 mg total) by mouth 2 (two) times daily.  . colchicine 0.6 MG tablet Take 1 tablet (0.6 mg total) by mouth daily as needed.  . desoximetasone (TOPICORT) 0.25 % cream Apply 1 application topically 2 (two) times daily.  . hydrALAZINE (  APRESOLINE) 100 MG tablet Take 1 tablet (100 mg total) by mouth 3 (three) times daily.  . hydrOXYzine (ATARAX/VISTARIL) 50 MG tablet Take 1 tablet (50 mg total) by mouth 3 (three) times daily as needed.  . loratadine (CLARITIN) 10 MG tablet Take 10 mg by mouth daily as needed for allergies.  Marland Kitchen zolpidem (AMBIEN) 5 MG tablet Take 1 tablet (5 mg total) by mouth at bedtime.      Assessment/ Plan:  56 y.o. male with poorly controlled hypertension, HIV no meds for 2 years, gout,h/o NSAID use was admitted on 08/22/2016 .   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  Anemia of CKD 2.  Poorly controlled hypertension with Edema 4.  HIV.  No meds for 2 years 5.  Hyperuricemia, gout  Patient has progressively worsening kidney disease for the  past year or so.  His baseline creatinine was 1.5/GFR 60 back in July of 2017.  Since then, his creatinine has been increasing.  His admission creatinine is 5.4.  He has proteinuria of 2 g.  Patient has history of significant nonsteroidal use.  He also has HIV and has not been taking his medication for the past 2 years.  Differential diagnosis of worsening renal failure includes HIV nephropathy, FSGS, interstitial nephritis, NSAID-induced nephropathy, hypertension nephrosclerosis  Patient has progressively worsening renal failure. He has likely progressed to ESRD. He has volume overload as evidenced by edema and uncontrolled HTN. Risks benefits and alternatives to starting dialysis were discussed. All questions answered. Patient has agreed to proceed.  Plan: Admit to initiate hemodialysis. Patient would eventually like to do PD to allow him to work full time - Consult vascular for permcath placement - PPD, Hepatitis studies - outpatient d/c planning - dialysis orders placed for Monday - Consider starting ESA once BP is better controlled. Patient is allergic to ACE-I.    LOS: 0 [facility] 8/26/201811:54 Neibert Buhler, [place]

## 2016-10-08 NOTE — Progress Notes (Signed)
Pt arrived from ED. He is A&O x 4 with elevated BP and bradycardic: BP (!) 187/107 (BP Location: Left Arm)   Pulse (!) 53   Temp 97.8 F (36.6 C) (Oral)   Resp 18   SpO2 99% . He denies h/a, SOB, CP, and blurry vision. Due to have his amlodipine and has hydralazine ordered for 1600. Dr. Margaretmary Eddy paged to see if I can give him his hydralazine early/if she wants me to administer anything IV for his BP. Per Dr. Margaretmary Eddy pt can have his hydralazine and amlodipine now, hold his atenolol for low BP, and recheck BP in 1-2 hours. She is going to order PRN IV medication for hypertension. Pt is complaining of feeling cold, but otherwise is resting comfortable in the bed. Warm blankets provided.

## 2016-10-08 NOTE — ED Notes (Addendum)
Pt transported to room 201

## 2016-10-08 NOTE — Consult Note (Signed)
Reason for Consult: CKD IV requiring permanent HD access Referring Physician: Dr. Duffy Bruce is an 56 y.o. male.  HPI: Patient with CKD, HIV, HTN. Admitted with lower extremity edema and worsening renal function. GFR  60. Cr. 7.5. Non-compliant with medications and follow up for all medical issues. Now requires HD. Hemodynamically stable, electrolytes normal.  Past Medical History:  Diagnosis Date  . Dermatophytosis of foot 06/23/2004  . Environmental allergies   . Gout   . HIV infection ([place]) 12/1995  . Hypertension   . Seborrhea 06/23/2004    Past Surgical History:  Procedure Laterality Date  . HERNIA REPAIR      Family History  Problem Relation Age of Onset  . Bradycardia Maternal Grandmother        with pacemaker    Social History:  reports that he has never smoked. He has never used smokeless tobacco. He reports that he drinks about 1.2 oz of alcohol per week . He reports that he does not use drugs.  Allergies:  Allergies  Allergen Reactions  . Quinapril Hcl Swelling    Face swells    Medications: I have reviewed the patient's current medications.  Results for orders placed or performed during the hospital encounter of 10/08/16 (from the past 48 hour(s))  Comprehensive metabolic panel     Status: Abnormal   Collection Time: 10/08/16  9:01 AM  Result Value Ref Range   Sodium 139 135 - 145 mmol/L   Potassium 3.8 3.5 - 5.1 mmol/L   Chloride 109 101 - 111 mmol/L   CO2 21 (L) 22 - 32 mmol/L   Glucose, Bld 93 65 - 99 mg/dL   BUN 94 (H) 6 - 20 mg/dL   Creatinine, Ser 7.54 (H) 0.61 - 1.24 mg/dL   Calcium 8.4 (L) 8.9 - 10.3 mg/dL   Total Protein 7.9 6.5 - 8.1 g/dL   Albumin 2.7 (L) 3.5 - 5.0 g/dL   AST 28 15 - 41 U/L   ALT 11 (L) 17 - 63 U/L   Alkaline Phosphatase 54 38 - 126 U/L   Total Bilirubin 0.2 (L) 0.3 - 1.2 mg/dL   GFR calc non Af Amer 7 (L) >60 mL/min   GFR calc Af Amer 8 (L) >60 mL/min    Comment: (NOTE) The eGFR has been calculated using  the CKD EPI equation. This calculation has not been validated in all clinical situations. eGFR's persistently <60 mL/min signify possible Chronic Kidney Disease.    Anion gap 9 5 - 15  Brain natriuretic peptide     Status: Abnormal   Collection Time: 10/08/16  9:01 AM  Result Value Ref Range   B Natriuretic Peptide 303.0 (H) 0.0 - 100.0 pg/mL  Troponin I     Status: None   Collection Time: 10/08/16  9:01 AM  Result Value Ref Range   Troponin I <0.03 <0.03 ng/mL  CBC with Differential     Status: Abnormal   Collection Time: 10/08/16  9:01 AM  Result Value Ref Range   WBC 4.3 3.8 - 10.6 K/uL   RBC 2.62 (L) 4.40 - 5.90 MIL/uL   Hemoglobin 8.9 (L) 13.0 - 18.0 g/dL   HCT 26.1 (L) 40.0 - 52.0 %   MCV 99.4 80.0 - 100.0 fL   MCH 33.9 26.0 - 34.0 pg   MCHC 34.1 32.0 - 36.0 g/dL   RDW 14.0 11.5 - 14.5 %   Platelets 196 150 - 440 K/uL   Neutrophils Relative %  51 %   Neutro Abs 2.2 1.4 - 6.5 K/uL   Lymphocytes Relative 34 %   Lymphs Abs 1.5 1.0 - 3.6 K/uL   Monocytes Relative 11 %   Monocytes Absolute 0.5 0.2 - 1.0 K/uL   Eosinophils Relative 4 %   Eosinophils Absolute 0.2 0 - 0.7 K/uL   Basophils Relative 0 %   Basophils Absolute 0.0 0 - 0.1 K/uL  Urinalysis, Complete w Microscopic     Status: Abnormal   Collection Time: 10/08/16 10:02 AM  Result Value Ref Range   Color, Urine YELLOW (A) YELLOW   APPearance CLEAR (A) CLEAR   Specific Gravity, Urine 1.009 1.005 - 1.030   pH 5.0 5.0 - 8.0   Glucose, UA NEGATIVE NEGATIVE mg/dL   Hgb urine dipstick SMALL (A) NEGATIVE   Bilirubin Urine NEGATIVE NEGATIVE   Ketones, ur NEGATIVE NEGATIVE mg/dL   Protein, ur 100 (A) NEGATIVE mg/dL   Nitrite NEGATIVE NEGATIVE   Leukocytes, UA NEGATIVE NEGATIVE   RBC / HPF 0-5 0 - 5 RBC/hpf   WBC, UA 0-5 0 - 5 WBC/hpf   Bacteria, UA NONE SEEN NONE SEEN   Squamous Epithelial / LPF NONE SEEN NONE SEEN    Dg Chest 2 View  Result Date: 10/08/2016 CLINICAL DATA:  Bilateral leg swelling. EXAM: CHEST  2  VIEW COMPARISON:  08/22/2016 FINDINGS: Old left seventh rib fracture. Few densities along the left cardiac border probably represent overlying densities. No focal airspace disease or pulmonary edema. Heart and mediastinum are within normal limits. No pleural effusions. IMPRESSION: No active cardiopulmonary disease. Electronically Signed   By: Markus Daft M.D.   On: 10/08/2016 09:42    Review of Systems  Constitutional: Positive for malaise/fatigue. Negative for chills.  Respiratory: Negative.   Cardiovascular: Positive for leg swelling. Negative for chest pain and orthopnea.  Gastrointestinal: Negative.    Blood pressure (!) 187/107, pulse (!) 53, temperature 97.8 F (36.6 C), temperature source Oral, resp. rate 18, height _0  (1.88 m), weight 92.2 kg (203 lb 3.2 oz), SpO2 99 %. Physical Exam  Nursing note and vitals reviewed. Constitutional: He is oriented to person, place, and time. He appears well-developed.  Cardiovascular: Normal rate and regular rhythm.   Respiratory: Effort normal and breath sounds normal.  GI: Soft. He exhibits no distension. There is no tenderness.  Musculoskeletal: He exhibits edema.  Neurological: He is alert and oriented to person, place, and time.    Assessment/Plan:  CKD IV. GFR 60. Worsening renal function requiring hemodialysis.  Will plan for a permacath placement tomorrow. Risks/benefits and alternatives explained. Understanding expressed. All questions answered.  Will also obtain vein mapping for future fistula placement. The patient expressed that he is also considering PD.  Esco, Miechia A 10/08/2016, 3:16 PM

## 2016-10-08 NOTE — ED Provider Notes (Signed)
[facility] Emergency Department Provider Note   ____________________________________________   First MD Initiated Contact with Patient 10/08/16 [phone]     (approximate)  I have reviewed the triage vital signs and the nursing notes.   HISTORY  Chief Complaint Leg Swelling    HPI Louis [name] is a 56 y.o. male who complains of bilateral leg swelling getting worse for 2-3 weeks. He has seen Dr. Severiano Gilbert and gotten put on Lasix 20 mg once a day. He says he has bad kidneys and hypertension. He also has HIV and was taking medicines when he was in [place] but about 6 months ago he moved and is out of his medicines he's post to go see Dr. Ola Spurr and start taking medicines again shortly. His appointment with Dr. Ola Spurr was on the sixth. He is not having any chest pain shortness of breath nausea vomiting diarrhea belly pain or any other complaints. His leg swelling gets better in the morning and worse as there he goes to work and stands for a while.   Past Medical History:  Diagnosis Date  . Dermatophytosis of foot 06/23/2004  . Environmental allergies   . Gout   . HIV infection ([place]) 12/1995  . Hypertension   . Seborrhea 06/23/2004    Patient Active Problem List   Diagnosis Date Noted  . AKI (acute kidney injury) ([place]) 08/22/2016  . Baker's cyst of knee, right 05/17/2016  . Elevated serum creatinine 05/17/2016  . Otalgia of left ear 03/29/2016  . Neck muscle spasm 03/16/2016  . Erythematous rash 03/16/2016  . ERRONEOUS ENCOUNTER--DISREGARD 03/10/2016  . Gout 01/05/2015  . Renal insufficiency 11/07/2012  . Hypertension 06/23/2010  . Rosacea 06/23/2010  . Encounter for long-term (current) use of other medications 06/09/2010  . Screening examination for venereal disease 06/09/2010  . Encounter for preventive health examination   . Human immunodeficiency virus (HIV) disease ([place]) 01/02/1996    Past Surgical History:  Procedure  Laterality Date  . HERNIA REPAIR      Prior to Admission medications   Medication Sig Start Date End Date Taking? Authorizing Provider  amLODipine (NORVASC) 10 MG tablet Take 1 tablet (10 mg total) by mouth daily. 08/28/16   Dustin Flock, MD  atenolol (TENORMIN) 100 MG tablet TAKE 1 TABLET BY MOUTH TWICE A DAY 09/21/16   Steele Sizer, MD  cloNIDine (CATAPRES) 0.1 MG tablet Take 1 tablet (0.1 mg total) by mouth 2 (two) times daily. 08/27/16   Dustin Flock, MD  colchicine 0.6 MG tablet Take 1 tablet (0.6 mg total) by mouth daily as needed. 03/16/16   Roselee Nova, MD  desoximetasone (TOPICORT) 0.25 % cream Apply 1 application topically 2 (two) times daily. Patient not taking: Reported on 08/22/2016 01/05/16   Sable Feil, PA-C  hydrALAZINE (APRESOLINE) 100 MG tablet Take 1 tablet (100 mg total) by mouth 3 (three) times daily. 08/27/16   Dustin Flock, MD  hydrOXYzine (ATARAX/VISTARIL) 50 MG tablet Take 1 tablet (50 mg total) by mouth 3 (three) times daily as needed. Patient taking differently: Take 25 mg by mouth 2 (two) times daily as needed.  01/05/16   Sable Feil, PA-C  zolpidem (AMBIEN) 5 MG tablet Take 1 tablet (5 mg total) by mouth at bedtime. 08/27/16   Dustin Flock, MD    Allergies Quinapril hcl  Family History  Problem Relation Age of Onset  . Bradycardia Maternal Grandmother        with pacemaker    Social  History Social History  Substance Use Topics  . Smoking status: Never Smoker  . Smokeless tobacco: Never Used  . Alcohol use 1.2 oz/week    2 Standard drinks or equivalent per week     Comment: Just on the weekend    Review of Systems  Constitutional: No fever/chills Eyes: No visual changes. ENT: No sore throat. Cardiovascular: Denies chest pain. Respiratory: Denies shortness of breath. Gastrointestinal: No abdominal pain.  No nausea, no vomiting.  No diarrhea.  No constipation. Genitourinary: Negative for dysuria. Musculoskeletal: Negative  for back pain. Skin: Negative for rash. Neurological: Negative for headaches, focal weakness   ____________________________________________   PHYSICAL EXAM:  VITAL SIGNS: ED Triage Vitals EKG read and interpreted by me shows   Enc Vitals Group     BP      Pulse      Resp      Temp      Temp src      SpO2      Weight      Height      Head Circumference      Peak Flow      Pain Score 0     Pain Loc      Pain Edu?      Excl. in [place]?    Constitutional: Alert and oriented. Well appearing and in no acute distress. Eyes: Conjunctivae are normal.  Head: Atraumatic. Nose: No congestion/rhinnorhea. Mouth/Throat: Mucous membranes are moist.  Oropharynx non-erythematous. Neck: No stridor.   Cardiovascular: Normal rate, regular rhythm. Grossly normal heart sounds.  Good peripheral circulation. Respiratory: Normal respiratory effort.  No retractions. Lungs CTAB. Gastrointestinal: Soft and nontender. No distention. No abdominal bruits. No CVA tenderness. Musculoskeletal: No lower extremity tenderness1+ edema bilaterally edema.  . Neurologic:  Normal speech and language. No gross focal neurologic deficits are appreciated. No gait instability. Skin:  Skin is warm, dry and intact. No rash noted. Psychiatric: Mood and affect are normal. Speech and behavior are normal.  ____________________________________________   LABS (all labs ordered are listed, but only abnormal results are displayed)  Labs Reviewed  COMPREHENSIVE METABOLIC PANEL - Abnormal; Notable for the following:       Result Value   CO2 21 (*)    BUN 94 (*)    Creatinine, Ser 7.54 (*)    Calcium 8.4 (*)    Albumin 2.7 (*)    ALT 11 (*)    Total Bilirubin 0.2 (*)    GFR calc non Af Amer 7 (*)    GFR calc Af Amer 8 (*)    All other components within normal limits  BRAIN NATRIURETIC PEPTIDE - Abnormal; Notable for the following:    B Natriuretic Peptide 303.0 (*)    All other components within normal limits  CBC WITH  DIFFERENTIAL/PLATELET - Abnormal; Notable for the following:    RBC 2.62 (*)    Hemoglobin 8.9 (*)    HCT 26.1 (*)    All other components within normal limits  URINALYSIS, COMPLETE (UACMP) WITH MICROSCOPIC - Abnormal; Notable for the following:    Color, Urine YELLOW (*)    APPearance CLEAR (*)    Hgb urine dipstick SMALL (*)    Protein, ur 100 (*)    All other components within normal limits  TROPONIN I   ____________________________________________  EKG  EKG read and interpreted by me shows normal sinus rhythm at a rate of 63 normal axis essentially normal EKG ____________________________________________  RADIOLOGY  IMPRESSION: No active cardiopulmonary  disease.   Electronically Signed   By: Markus Daft M.D.   On: 10/08/2016 09:42 ____________________________________________   PROCEDURES  Procedure(s) performed:  Procedures  Critical Care performed:   ____________________________________________   INITIAL IMPRESSION / ASSESSMENT AND PLAN / ED COURSE  Pertinent labs & imaging results that were available during my care of the patient were reviewed by me and considered in my medical decision making (see chart for details).        ____________________________________________   FINAL CLINICAL IMPRESSION(S) / ED DIAGNOSES  Final diagnoses:  Peripheral edema  AKI (acute kidney injury) ([place])      NEW MEDICATIONS STARTED DURING THIS VISIT:  New Prescriptions   No medications on file     Note:  This document was prepared using Dragon voice recognition software and may include unintentional dictation errors.    Nena Polio, MD 10/08/16 1106

## 2016-10-08 NOTE — ED Triage Notes (Signed)
Patient arrives to [facility] ED with c/o bilateral leg swelling, worsening for 3 weeks. Patient states he has a hx of "bad kidneys" and hypertension.

## 2016-10-08 NOTE — H&P (Signed)
[place] [facility] [place] NAME: Louis [name]    MR#:  [id number]  DATE OF BIRTH:  07/04/1960  DATE OF ADMISSION:  10/08/2016  PRIMARY CARE PHYSICIAN: Roselee Nova, MD   REQUESTING/REFERRING PHYSICIAN: Rip Harbour  CHIEF COMPLAINT:  Leg swelling  HISTORY OF PRESENT ILLNESS:  Louis [name]  is a 56 y.o. male with a known history of HIV chronic kidney disease, hypertension, gout is noncompliant with the medications including HIV meds and presenting to the emergency department with a chief complaint of leg swelling. Denies any chest pain or shortness of breath. Patient's creatinine is at 7.54 and BUN is at 94, baseline creatinine was 1.5 with GFR 60 in July 2017. Patient was seen by nephrology in the ED who is recommending dialysis starting from tomorrow  PAST MEDICAL HISTORY:   Past Medical History:  Diagnosis Date  . Dermatophytosis of foot 06/23/2004  . Environmental allergies   . Gout   . HIV infection ([place]) 12/1995  . Hypertension   . Seborrhea 06/23/2004    PAST SURGICAL HISTOIRY:   Past Surgical History:  Procedure Laterality Date  . HERNIA REPAIR      SOCIAL HISTORY:   Social History  Substance Use Topics  . Smoking status: Never Smoker  . Smokeless tobacco: Never Used  . Alcohol use 1.2 oz/week    2 Standard drinks or equivalent per week     Comment: Just on the weekend    FAMILY HISTORY:   Family History  Problem Relation Age of Onset  . Bradycardia Maternal Grandmother        with pacemaker    DRUG ALLERGIES:   Allergies  Allergen Reactions  . Quinapril Hcl Swelling    Face swells    REVIEW OF SYSTEMS:  CONSTITUTIONAL: No fever, fatigue or weakness.  EYES: No blurred or double vision.  EARS, NOSE, AND THROAT: No tinnitus or ear pain.  RESPIRATORY: No cough, shortness of breath, wheezing or hemoptysis.  CARDIOVASCULAR: No chest pain, orthopnea, edema.  GASTROINTESTINAL: No nausea, vomiting,  diarrhea or abdominal pain.  GENITOURINARY: No dysuria, hematuria.  ENDOCRINE: No polyuria, nocturia,  HEMATOLOGY: No anemia, easy bruising or bleeding SKIN: No rash or lesion. MUSCULOSKELETAL:bilateral leg swelling with pain No joint pain or arthritis.   NEUROLOGIC: No tingling, numbness, weakness.  PSYCHIATRY: No anxiety or depression.   MEDICATIONS AT HOME:   Prior to Admission medications   Medication Sig Start Date End Date Taking? Authorizing Provider  amLODipine (NORVASC) 10 MG tablet Take 1 tablet (10 mg total) by mouth daily. 08/28/16  Yes Dustin Flock, MD  atenolol (TENORMIN) 50 MG tablet Take 50 mg by mouth 2 (two) times daily.   Yes [provider]  cloNIDine (CATAPRES) 0.1 MG tablet Take 1 tablet (0.1 mg total) by mouth 2 (two) times daily. 08/27/16  Yes Dustin Flock, MD  colchicine 0.6 MG tablet Take 1 tablet (0.6 mg total) by mouth daily as needed. 03/16/16  Yes Roselee Nova, MD  desoximetasone (TOPICORT) 0.25 % cream Apply 1 application topically 2 (two) times daily. 01/05/16  Yes Sable Feil, PA-C  hydrALAZINE (APRESOLINE) 100 MG tablet Take 1 tablet (100 mg total) by mouth 3 (three) times daily. 08/27/16  Yes Dustin Flock, MD  hydrOXYzine (ATARAX/VISTARIL) 50 MG tablet Take 1 tablet (50 mg total) by mouth 3 (three) times daily as needed. 01/05/16  Yes Sable Feil, PA-C  loratadine (CLARITIN) 10 MG tablet Take 10 mg  by mouth daily as needed for allergies.   Yes [provider]  zolpidem (AMBIEN) 5 MG tablet Take 1 tablet (5 mg total) by mouth at bedtime. 08/27/16  Yes Dustin Flock, MD      VITAL SIGNS:  Blood pressure (!) 181/109, pulse (!) 52, resp. rate 20, SpO2 99 %.  PHYSICAL EXAMINATION:  GENERAL:  56 y.o.-year-old patient lying in the bed with no acute distress.  EYES: Pupils equal, round, reactive to light and accommodation. No scleral icterus. Extraocular muscles intact.  HEENT: Head atraumatic, normocephalic. Oropharynx and  nasopharynx clear.  NECK:  Supple, no jugular venous distention. No thyroid enlargement, no tenderness.  LUNGS: Normal breath sounds bilaterally, no wheezing, rales,rhonchi or crepitation. No use of accessory muscles of respiration.  CARDIOVASCULAR: S1, S2 normal. No murmurs, rubs, or gallops.  ABDOMEN: Soft, nontender, nondistended. Bowel sounds present. No organomegaly or mass.  EXTREMITIES:bilateral lower extremity edema with calf tenderness. No cyanosis, or clubbing.  NEUROLOGIC: Cranial nerves II through XII are intact. Muscle strength 5/5 in all extremities. Sensation intact. Gait not checked.  PSYCHIATRIC: The patient is alert and oriented x 3.  SKIN: No obvious rash, lesion, or ulcer.   LABORATORY PANEL:   CBC  Recent Labs Lab 10/08/16 0901  WBC 4.3  HGB 8.9*  HCT 26.1*  PLT 196   ------------------------------------------------------------------------------------------------------------------  Chemistries   Recent Labs Lab 10/08/16 0901  NA 139  K 3.8  CL 109  CO2 21*  GLUCOSE 93  BUN 94*  CREATININE 7.54*  CALCIUM 8.4*  AST 28  ALT 11*  ALKPHOS 54  BILITOT 0.2*   ------------------------------------------------------------------------------------------------------------------  Cardiac Enzymes  Recent Labs Lab 10/08/16 0901  TROPONINI <0.03   ------------------------------------------------------------------------------------------------------------------  RADIOLOGY:  Dg Chest 2 View  Result Date: 10/08/2016 CLINICAL DATA:  Bilateral leg swelling. EXAM: CHEST  2 VIEW COMPARISON:  08/22/2016 FINDINGS: Old left seventh rib fracture. Few densities along the left cardiac border probably represent overlying densities. No focal airspace disease or pulmonary edema. Heart and mediastinum are within normal limits. No pleural effusions. IMPRESSION: No active cardiopulmonary disease. Electronically Signed   By: Markus Daft M.D.   On: 10/08/2016 09:42    EKG:    Orders placed or performed during the hospital encounter of 10/08/16  . EKG 12-Lead  . EKG 12-Lead    IMPRESSION AND PLAN:    Louis [name]  is a 56 y.o. male with a known history of HIV chronic kidney disease, hypertension, gout is noncompliant with the medications including HIV meds and presenting to the emergency department with a chief complaint of leg swelling. Denies any chest pain or shortness of breath. 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 Admit to MedSurg unit  consult vascular surgery for dialysis catheter placement Consulted nephrology for hemodialysis, patient does not have hyperkalemia and hemodynamically stable.nephrology is considering to dialyze him tomorrow after dialysis catheter placement Baseline creatinine 1.5 in July 2017 Stop NSAIDs and other nephrotoxins  # chronic history of HIV noncompliant with meds Check HIV RNA and CD4 count Consults infectious disease Currently not on any medications for HIV, patient stopped taking  #Anemia of chronic kidney disease Monitor hemoglobin and transfuse as needed ESA once BP is better  #essential hypertension with noncompliance with meds Resume home meds amlodipine, atenolol, clonidine and hydralazine; uptitrate as needed  #bilateral lower extremity edema with  tenderness Rule out DVT with venous Dopplers Clinically will get better after hemodialysis Compression stockings Lasix as needed, encouraged patient to keep his legs elevated    DVT prophylaxis with Lovenox renal dose adjusted  All the records are reviewed and case discussed with ED provider. Management plans discussed with the patient, family and they are in agreement.  CODE STATUS: fc   TOTAL TIME TAKING CARE OF THIS PATIENT: 45  minutes.   Note: This dictation was prepared with Dragon dictation along with  smaller phrase technology. Any transcriptional errors that result from this process are unintentional.  Nicholes Mango M.D on 10/08/2016 at 2:14 PM  Between 7am to 6pm - Pager - [phone]  After 6pm go to [email or web address] - password [email or web address] [facility]  [place] [facility]  Office  [phone]  CC: Primary care physician; Roselee Nova, MD

## 2016-10-08 NOTE — Progress Notes (Addendum)
Prophylactic enoxaparin 30 mg SQ q24h was ordered. Pharmacy changed to prophylactic heparin due to CrCl 13 ml/min.    [place] Resident

## 2016-10-08 NOTE — Progress Notes (Signed)
Tuberculin administered to R anterior forearm

## 2016-10-09 ENCOUNTER — Encounter
Admission: EM | Disposition: A | Discharge status: Home / Self Care | Payer: Self-pay | Source: Home / Self Care | Attending: Internal Medicine

## 2016-10-09 ENCOUNTER — Encounter: Payer: Self-pay | Admitting: Vascular Surgery

## 2016-10-09 DIAGNOSIS — N186 End stage renal disease: Secondary | ICD-10-CM

## 2016-10-09 HISTORY — PX: DIALYSIS/PERMA CATHETER INSERTION: CATH118288

## 2016-10-09 LAB — CBC
HCT: 25.9 % — ABNORMAL LOW (ref 40.0–52.0)
Hemoglobin: 8.9 g/dL — ABNORMAL LOW (ref 13.0–18.0)
MCH: 34.2 pg — ABNORMAL HIGH (ref 26.0–34.0)
MCHC: 34.3 g/dL (ref 32.0–36.0)
MCV: 99.8 fL (ref 80.0–100.0)
Platelets: 189 10*3/uL (ref 150–440)
RBC: 2.6 MIL/uL — ABNORMAL LOW (ref 4.40–5.90)
RDW: 13.9 % (ref 11.5–14.5)
WBC: 4.6 10*3/uL (ref 3.8–10.6)

## 2016-10-09 LAB — COMPREHENSIVE METABOLIC PANEL
ALT: 10 U/L — ABNORMAL LOW (ref 17–63)
AST: 24 U/L (ref 15–41)
Albumin: 2.6 g/dL — ABNORMAL LOW (ref 3.5–5.0)
Alkaline Phosphatase: 40 U/L (ref 38–126)
Anion gap: 8 (ref 5–15)
BUN: 91 mg/dL — ABNORMAL HIGH (ref 6–20)
CO2: 20 mmol/L — ABNORMAL LOW (ref 22–32)
Calcium: 8.5 mg/dL — ABNORMAL LOW (ref 8.9–10.3)
Chloride: 110 mmol/L (ref 101–111)
Creatinine, Ser: 7.23 mg/dL — ABNORMAL HIGH (ref 0.61–1.24)
GFR calc Af Amer: 9 mL/min — ABNORMAL LOW (ref >60)
GFR calc non Af Amer: 8 mL/min — ABNORMAL LOW (ref >60)
Glucose, Bld: 91 mg/dL (ref 65–99)
Potassium: 3.8 mmol/L (ref 3.5–5.1)
Sodium: 138 mmol/L (ref 135–145)
Total Bilirubin: 0.4 mg/dL (ref 0.3–1.2)
Total Protein: 7.7 g/dL (ref 6.5–8.1)

## 2016-10-09 LAB — PHOSPHORUS: Phosphorus: 3.9 mg/dL (ref 2.5–4.6)

## 2016-10-09 SURGERY — DIALYSIS/PERMA CATHETER INSERTION
Anesthesia: Moderate Sedation

## 2016-10-09 MED ORDER — MIDAZOLAM HCL 5 MG/5ML IJ SOLN
INTRAMUSCULAR | Status: AC
  Filled 2016-10-09: qty 5

## 2016-10-09 MED ORDER — LIDOCAINE-EPINEPHRINE (PF) 2 %-1:200000 IJ SOLN
INTRAMUSCULAR | Status: AC
  Filled 2016-10-09: qty 20

## 2016-10-09 MED ORDER — FENTANYL CITRATE (PF) 100 MCG/2ML IJ SOLN
INTRAMUSCULAR | Status: DC | PRN
  Administered 2016-10-09: 50 ug via INTRAVENOUS

## 2016-10-09 MED ORDER — COLCHICINE 0.6 MG PO TABS
0.6000 mg | ORAL_TABLET | Freq: Every day | ORAL | Status: DC | PRN
  Administered 2016-10-09 – 2016-10-10 (×2): 0.6 mg via ORAL
  Filled 2016-10-09 (×2): qty 1

## 2016-10-09 MED ORDER — SODIUM CHLORIDE 0.9 % IV SOLN
INTRAVENOUS | Status: DC
  Administered 2016-10-09: 08:00:00 via INTRAVENOUS

## 2016-10-09 MED ORDER — MIDAZOLAM HCL 2 MG/2ML IJ SOLN
INTRAMUSCULAR | Status: DC | PRN
  Administered 2016-10-09: 2 mg via INTRAVENOUS

## 2016-10-09 MED ORDER — HEPARIN SODIUM (PORCINE) 10000 UNIT/ML IJ SOLN
INTRAMUSCULAR | Status: AC
  Filled 2016-10-09: qty 1

## 2016-10-09 MED ORDER — FENTANYL CITRATE (PF) 100 MCG/2ML IJ SOLN
INTRAMUSCULAR | Status: AC
  Filled 2016-10-09: qty 2

## 2016-10-09 MED ORDER — CEFUROXIME SODIUM 1.5 G IV SOLR
1.5000 g | INTRAVENOUS | Status: AC
  Administered 2016-10-09: 1.5 g via INTRAVENOUS
  Filled 2016-10-09: qty 1.5

## 2016-10-09 SURGICAL SUPPLY — 9 items
ADH SKN CLS APL DERMABOND .7 (GAUZE/BANDAGES/DRESSINGS) ×1
CATH PALINDROME RT-P 15FX19CM (CATHETERS) ×1 IMPLANT
COVER PROBE U/S 5X48 (MISCELLANEOUS) ×2 IMPLANT
DERMABOND ADVANCED (GAUZE/BANDAGES/DRESSINGS) ×1
DERMABOND ADVANCED .7 DNX12 (GAUZE/BANDAGES/DRESSINGS) IMPLANT
PACK ANGIOGRAPHY (CUSTOM PROCEDURE TRAY) ×2 IMPLANT
SUT MNCRL AB 4-0 PS2 18 (SUTURE) ×2 IMPLANT
SUT PROLENE 0 CT 1 30 (SUTURE) ×2 IMPLANT
TOWEL OR 17X26 4PK STRL BLUE (TOWEL DISPOSABLE) ×2 IMPLANT

## 2016-10-09 NOTE — Care Management Note (Signed)
Case Management Note  Patient Details  Name: AMALIO LOE MRN: [id number] Date of Birth: Nov 06, 1960  Subjective/Objective:   Admitted to [facility] with the diagnosis of acute kidney injury. Lives with a roommate. Mother is Gregary Signs [phone]). Sees Dr. Manuella Ghazi as primary care physician. Sees Dr. Ola Spurr for infectious diseases. The medication resources of Medication Management, MATCH, and Good Rx given in the past. No medical insurance. No home Health. No skilled facility. No equipment in the home.                    Action/Plan: Perma-Catheter placed  10/09/16. Will be going for 1st round of dialysis later today. Elvera Bicker, dialysis representative updated   Expected Discharge Date:                  Expected Discharge Plan:     In-House Referral:     Discharge planning Services     Post Acute Care Choice:    Choice offered to:     DME Arranged:    DME Agency:     HH Arranged:    HH Agency:     Status of Service:     If discussed at [employer] of Stay Meetings, dates discussed:    Additional Comments:  Shelbie Ammons, [place] Management [phone] 10/09/2016, 11:49 AM

## 2016-10-09 NOTE — Progress Notes (Signed)
Post hemodialysis 

## 2016-10-09 NOTE — Op Note (Signed)
OPERATIVE NOTE    PRE-OPERATIVE DIAGNOSIS: 1. ESRD 2. HIV  POST-OPERATIVE DIAGNOSIS: same as above  PROCEDURE: 1. Ultrasound guidance for vascular access to the right internal jugular vein 2. Fluoroscopic guidance for placement of catheter 3. Placement of a 19 cm tip to cuff tunneled hemodialysis catheter via the right internal jugular vein  SURGEON: Leotis Pain, MD  ANESTHESIA:  Local with Moderate conscious sedation for approximately 15 minutes using 2 mg of Versed and 50 mcg of Fentanyl  ESTIMATED BLOOD LOSS: 5 cc  FLUORO TIME: less than one minute  CONTRAST: none  FINDING(S): 1.  Patent right internal jugular vein  SPECIMEN(S):  None  INDICATIONS:   Louis [name] is a 56 y.o.male who presents with renal failure.  The patient needs long term dialysis access for their ESRD, and a Permcath is necessary.  Risks and benefits are discussed and informed consent is obtained.    DESCRIPTION: After obtaining full informed written consent, the patient was brought back to the vascular suited. The patient's right neck and chest were sterilely prepped and draped in a sterile surgical field was created. Moderate conscious sedation was administered during a face to face encounter with the patient throughout the procedure with my supervision of the RN administering medicines and monitoring the patient's vital signs, pulse oximetry, telemetry and mental status throughout from the start of the procedure until the patient was taken to the recovery room.  The right internal jugular vein was visualized with ultrasound and found to be patent. It was then accessed under direct ultrasound guidance and a permanent image was recorded. A wire was placed. After skin nick and dilatation, the peel-away sheath was placed over the wire. I then turned my attention to an area under the clavicle. Approximately 1-2 fingerbreadths below the clavicle a small counterincision was created and tunneled from the  subclavicular incision to the access site. Using fluoroscopic guidance, a 19 centimeter tip to cuff tunneled hemodialysis catheter was selected, and tunneled from the subclavicular incision to the access site. It was then placed through the peel-away sheath and the peel-away sheath was removed. Using fluoroscopic guidance the catheter tips were parked in the right atrium. The appropriate distal connectors were placed. It withdrew blood well and flushed easily with heparinized saline and a concentrated heparin solution was then placed. It was secured to the chest wall with 2 Prolene sutures. The access incision was closed single 4-0 Monocryl. A 4-0 Monocryl pursestring suture was placed around the exit site. Sterile dressings were placed. The patient tolerated the procedure well and was taken to the recovery room in stable condition.  COMPLICATIONS: None  CONDITION: Stable  Leotis Pain, MD 10/09/2016 9:06 AM   This note was created with Dragon Medical transcription system. Any errors in dictation are purely unintentional.

## 2016-10-09 NOTE — Consult Note (Signed)
[facility] Infectious Disease     Reason for Consult: HIV, ESRD   Referring Physician: Nicholes Mango  Date of Admission:  10/08/2016   Active Problems:   AKI (acute kidney injury) [facility])   HPI: Louis [name] is a 56 y.o. male admitted with progressive renal failure in setting of HIV. Seen in July for first time and was off meds for 2 years.  CD4  139, VL 9000 and genotype pan sensitive in July 2018  Currently having a flare of gout on R knee. Seen today on HD. Tolerating wel   Past Medical History:  Diagnosis Date  . Dermatophytosis of foot 06/23/2004  . Environmental allergies   . Gout   . HIV infection ([place]) 12/1995  . Hypertension   . Seborrhea 06/23/2004   Past Surgical History:  Procedure Laterality Date  . DIALYSIS/PERMA CATHETER INSERTION N/A 10/09/2016   Procedure: DIALYSIS/PERMA CATHETER INSERTION;  Surgeon: Algernon Huxley, MD;  Location: [place] [facility];  Service: Cardiovascular;  Laterality: N/A;  . HERNIA REPAIR     Social History  Substance Use Topics  . Smoking status: Never Smoker  . Smokeless tobacco: Never Used  . Alcohol use 1.2 oz/week    2 Standard drinks or equivalent per week     Comment: Just on the weekend   Family History  Problem Relation Age of Onset  . Bradycardia Maternal Grandmother        with pacemaker    Allergies:  Allergies  Allergen Reactions  . Quinapril Hcl Swelling    Face swells    Current antibiotics: Antibiotics Given (last 72 hours)    Date/Time Action Medication Dose Rate   10/09/16 0840 New Bag/Given   cefUROXime (ZINACEF) 1.5 g in dextrose 5 % 50 mL IVPB 1.5 g 100 mL/hr      MEDICATIONS: . amLODipine  10 mg Oral Daily  . atenolol  50 mg Oral BID  . cloNIDine  0.1 mg Oral BID  . heparin subcutaneous  5,000 Units Subcutaneous Q8H  . hydrALAZINE  100 mg Oral TID  . tuberculin  5 Units Intradermal Once  . zolpidem  5 mg Oral QHS    Review of Systems - 11 systems reviewed and negative per  HPI   OBJECTIVE: Temp:  [97.7 F (36.5 C)-98.5 F (36.9 C)] 97.8 F (36.6 C) (08/27 1626) Pulse Rate:  [54-73] 62 (08/27 1626) Resp:  [16-24] 18 (08/27 1626) BP: (130-168)/(84-107) 153/100 (08/27 1626) SpO2:  [92 %-100 %] 99 % (08/27 1626) Weight:  [92.1 kg (203 lb)-92.1 kg (203 lb 0.7 oz)] 92.1 kg (203 lb 0.7 oz) (08/27 1405) Physical Exam  Constitutional: He is oriented to person, place, and time. He appears well-developed and well-nourished. No distress.  HENT: anicterc Mouth/Throat: Oropharynx is clear and moist. No oropharyngeal exudate.  Cardiovascular: Normal rate, regular rhythm and normal heart sounds. HD cath R chest wall wnl Pulmonary/Chest: Effort normal and breath sounds normal. No respiratory distress. He has no wheezes.  Abdominal: Soft. Bowel sounds are normal. He exhibits no distension. There is no tenderness.  Lymphadenopathy:  He has no cervical adenopathy.  Neurological: He is alert and oriented to person, place, and time.  Ext R knee swollen Skin: Skin is warm and dry. No rash noted. No erythema.  Psychiatric: He has a normal mood and affect. His behavior is normal.     LABS: Results for orders placed or performed during the hospital encounter of 10/08/16 (from the past 48 hour(s))  Comprehensive  metabolic panel     Status: Abnormal   Collection Time: 10/08/16  9:01 AM  Result Value Ref Range   Sodium 139 135 - 145 mmol/L   Potassium 3.8 3.5 - 5.1 mmol/L   Chloride 109 101 - 111 mmol/L   CO2 21 (L) 22 - 32 mmol/L   Glucose, Bld 93 65 - 99 mg/dL   BUN 94 (H) 6 - 20 mg/dL   Creatinine, Ser 7.54 (H) 0.61 - 1.24 mg/dL   Calcium 8.4 (L) 8.9 - 10.3 mg/dL   Total Protein 7.9 6.5 - 8.1 g/dL   Albumin 2.7 (L) 3.5 - 5.0 g/dL   AST 28 15 - 41 U/L   ALT 11 (L) 17 - 63 U/L   Alkaline Phosphatase 54 38 - 126 U/L   Total Bilirubin 0.2 (L) 0.3 - 1.2 mg/dL   GFR calc non Af Amer 7 (L) >60 mL/min   GFR calc Af Amer 8 (L) >60 mL/min    Comment: (NOTE) The eGFR  has been calculated using the CKD EPI equation. This calculation has not been validated in all clinical situations. eGFR's persistently <60 mL/min signify possible Chronic Kidney Disease.    Anion gap 9 5 - 15  Brain natriuretic peptide     Status: Abnormal   Collection Time: 10/08/16  9:01 AM  Result Value Ref Range   B Natriuretic Peptide 303.0 (H) 0.0 - 100.0 pg/mL  Troponin I     Status: None   Collection Time: 10/08/16  9:01 AM  Result Value Ref Range   Troponin I <0.03 <0.03 ng/mL  CBC with Differential     Status: Abnormal   Collection Time: 10/08/16  9:01 AM  Result Value Ref Range   WBC 4.3 3.8 - 10.6 K/uL   RBC 2.62 (L) 4.40 - 5.90 MIL/uL   Hemoglobin 8.9 (L) 13.0 - 18.0 g/dL   HCT 26.1 (L) 40.0 - 52.0 %   MCV 99.4 80.0 - 100.0 fL   MCH 33.9 26.0 - 34.0 pg   MCHC 34.1 32.0 - 36.0 g/dL   RDW 14.0 11.5 - 14.5 %   Platelets 196 150 - 440 K/uL   Neutrophils Relative % 51 %   Neutro Abs 2.2 1.4 - 6.5 K/uL   Lymphocytes Relative 34 %   Lymphs Abs 1.5 1.0 - 3.6 K/uL   Monocytes Relative 11 %   Monocytes Absolute 0.5 0.2 - 1.0 K/uL   Eosinophils Relative 4 %   Eosinophils Absolute 0.2 0 - 0.7 K/uL   Basophils Relative 0 %   Basophils Absolute 0.0 0 - 0.1 K/uL  Urinalysis, Complete w Microscopic     Status: Abnormal   Collection Time: 10/08/16 10:02 AM  Result Value Ref Range   Color, Urine YELLOW (A) YELLOW   APPearance CLEAR (A) CLEAR   Specific Gravity, Urine 1.009 1.005 - 1.030   pH 5.0 5.0 - 8.0   Glucose, UA NEGATIVE NEGATIVE mg/dL   Hgb urine dipstick SMALL (A) NEGATIVE   Bilirubin Urine NEGATIVE NEGATIVE   Ketones, ur NEGATIVE NEGATIVE mg/dL   Protein, ur 100 (A) NEGATIVE mg/dL   Nitrite NEGATIVE NEGATIVE   Leukocytes, UA NEGATIVE NEGATIVE   RBC / HPF 0-5 0 - 5 RBC/hpf   WBC, UA 0-5 0 - 5 WBC/hpf   Bacteria, UA NONE SEEN NONE SEEN   Squamous Epithelial / LPF NONE SEEN NONE SEEN  Surgical PCR screen     Status: None   Collection Time: 10/08/16  2:45  PM   Result Value Ref Range   MRSA, PCR NEGATIVE NEGATIVE   Staphylococcus aureus NEGATIVE NEGATIVE    Comment:        The Xpert SA Assay (FDA approved for NASAL specimens in patients over 5 years of age), is one component of a comprehensive surveillance program.  Test performance has been validated by [facility] for patients greater than or equal to 70 year old. It is not intended to diagnose infection nor to guide or monitor treatment.   Comprehensive metabolic panel     Status: Abnormal   Collection Time: 10/09/16  4:05 AM  Result Value Ref Range   Sodium 138 135 - 145 mmol/L   Potassium 3.8 3.5 - 5.1 mmol/L   Chloride 110 101 - 111 mmol/L   CO2 20 (L) 22 - 32 mmol/L   Glucose, Bld 91 65 - 99 mg/dL   BUN 91 (H) 6 - 20 mg/dL   Creatinine, Ser 7.23 (H) 0.61 - 1.24 mg/dL   Calcium 8.5 (L) 8.9 - 10.3 mg/dL   Total Protein 7.7 6.5 - 8.1 g/dL   Albumin 2.6 (L) 3.5 - 5.0 g/dL   AST 24 15 - 41 U/L   ALT 10 (L) 17 - 63 U/L   Alkaline Phosphatase 40 38 - 126 U/L   Total Bilirubin 0.4 0.3 - 1.2 mg/dL   GFR calc non Af Amer 8 (L) >60 mL/min   GFR calc Af Amer 9 (L) >60 mL/min    Comment: (NOTE) The eGFR has been calculated using the CKD EPI equation. This calculation has not been validated in all clinical situations. eGFR's persistently <60 mL/min signify possible Chronic Kidney Disease.    Anion gap 8 5 - 15  CBC     Status: Abnormal   Collection Time: 10/09/16  4:05 AM  Result Value Ref Range   WBC 4.6 3.8 - 10.6 K/uL   RBC 2.60 (L) 4.40 - 5.90 MIL/uL   Hemoglobin 8.9 (L) 13.0 - 18.0 g/dL   HCT 25.9 (L) 40.0 - 52.0 %   MCV 99.8 80.0 - 100.0 fL   MCH 34.2 (H) 26.0 - 34.0 pg   MCHC 34.3 32.0 - 36.0 g/dL   RDW 13.9 11.5 - 14.5 %   Platelets 189 150 - 440 K/uL  Phosphorus     Status: None   Collection Time: 10/09/16  3:00 PM  Result Value Ref Range   Phosphorus 3.9 2.5 - 4.6 mg/dL   No components found for: ESR, C REACTIVE PROTEIN MICRO: Recent Results (from the past  720 hour(s))  Surgical PCR screen     Status: None   Collection Time: 10/08/16  2:45 PM  Result Value Ref Range Status   MRSA, PCR NEGATIVE NEGATIVE Final   Staphylococcus aureus NEGATIVE NEGATIVE Final    Comment:        The Xpert SA Assay (FDA approved for NASAL specimens in patients over 50 years of age), is one component of a comprehensive surveillance program.  Test performance has been validated by [facility] for patients greater than or equal to 18 year old. It is not intended to diagnose infection nor to guide or monitor treatment.     IMAGING: Dg Chest 2 View  Result Date: 10/08/2016 CLINICAL DATA:  Bilateral leg swelling. EXAM: CHEST  2 VIEW COMPARISON:  08/22/2016 FINDINGS: Old left seventh rib fracture. Few densities along the left cardiac border probably represent overlying densities. No focal airspace disease or pulmonary edema. Heart and  mediastinum are within normal limits. No pleural effusions. IMPRESSION: No active cardiopulmonary disease. Electronically Signed   By: Markus Daft M.D.   On: 10/08/2016 09:42   [place] Ue Vein Mapping Left  Result Date: 10/09/2016 CLINICAL DATA:  Chronic kidney disease stage IV EXAM: [place] EXTREM UP VEIN MAPPING COMPARISON:  None. FINDINGS: LEFT ARTERIES Wrist Radial Artery: Size 54m  Waveform triphasic antegrade Wrist Ulnar Artery: Size 1.36m Waveform triphasic antegrade Prox. Forearm Radial Artery: Size 74m60mWaveform triphasic antegrade Upper Arm Brachial Artery: Size 5.74mm574maveform triphasic antegrade Proximal origin of the radial artery in the upper arm. LEFT VEINS Forearm Cephalic Vein: Prox 1.42m1.3YQtal 2.1mm 87mth 2.3mm6.5HQarm Basilic Vein: Diminutive Upper Arm Cephalic Vein: Prox 2.1mm4.6NGal 2.1mm D47mh 2.3 -3.2  mm Upper Arm Basilic Vein: Prox 1.6mm 2.9BMh 4.7mm Up54m arm brachial vein 6.4 mm proximal, 7 mm mid, 13.5 mm deep ADDITIONAL LEFT VEINS Axillary Vein:  8mm Sub274mvian Vein: Patient: Yes Respiratory Plascity: Present Internal  Jugular Vein: Patent: Yes    Respiratory Plascity: Present Branches > 2 mm: Had while at the and has filling of right forehead. Right IJ and subclavian veins Patent: Yes  Respiratory Plascity: Present IMPRESSION: 1. Negative for left upper extremity DVT. 2. Normal flow direction and waveforms in the left brachial, radial and ulnar arteries. 3. Left upper extremity Vessel dimensions as detailed above. Electronically Signed   By: D  HasseLucrezia EuropeOn: 10/09/2016 09:13   Us VenouKoreaImg Lower Bilateral  Result Date: 10/08/2016 CLINICAL DATA:  56 year 9d with calf tenderness for 2 weeks. EXAM: BILATERAL LOWER EXTREMITY VENOUS DOPPLER ULTRASOUND TECHNIQUE: Gray-scale sonography with graded compression, as well as color Doppler and duplex ultrasound were performed to evaluate the lower extremity deep venous systems from the level of the common femoral vein and including the common femoral, femoral, profunda femoral, popliteal and calf veins including the posterior tibial, peroneal and gastrocnemius veins when visible. The superficial great saphenous vein was also interrogated. Spectral Doppler was utilized to evaluate flow at rest and with distal augmentation maneuvers in the common femoral, femoral and popliteal veins. COMPARISON:  Right lower extremity venous Doppler ultrasound 05/11/2016. FINDINGS: RIGHT LOWER EXTREMITY Common Femoral Vein: No evidence of thrombus. Normal compressibility, respiratory phasicity and response to augmentation. Saphenofemoral Junction: No evidence of thrombus. Normal compressibility and flow on color Doppler imaging. Profunda Femoral Vein: No evidence of thrombus. Normal compressibility and flow on color Doppler imaging. Femoral Vein: No evidence of thrombus. Normal compressibility, respiratory phasicity and response to augmentation. Popliteal Vein: No evidence of thrombus. Normal compressibility, respiratory phasicity and response to augmentation. Calf Veins: No evidence of thrombus.  Normal compressibility and flow on color Doppler imaging. Superficial Great Saphenous Vein: No evidence of thrombus. Normal compressibility and flow on color Doppler imaging. Other Findings: There is a large complex cystic lesion posterior to the right knee, consistent with a Baker's cyst. This measures approximately 13.2 x 2.8 x 4.5 cm. This is not significantly changed from the previous study. Mildly prominent inguinal lymph nodes are present with retained fatty hila, likely reactive. LEFT LOWER EXTREMITY Common Femoral Vein: No evidence of thrombus. Normal compressibility, respiratory phasicity and response to augmentation. Saphenofemoral Junction: No evidence of thrombus. Normal compressibility and flow on color Doppler imaging. Profunda Femoral Vein: No evidence of thrombus. Normal compressibility and flow on color Doppler imaging. Femoral Vein: No evidence of thrombus. Normal compressibility, respiratory phasicity and response to augmentation. Popliteal Vein: No evidence of thrombus. Normal compressibility, respiratory  phasicity and response to augmentation. Calf Veins: No evidence of thrombus. Normal compressibility and flow on color Doppler imaging. Superficial Great Saphenous Vein: No evidence of thrombus. Normal compressibility and flow on color Doppler imaging. Other Findings: Mildly prominent inguinal lymph nodes are present with retained fatty hila, likely reactive. IMPRESSION: 1. No evidence of DVT within either lower extremity. 2. Stable large Baker's cyst on the right. 3. Reactive inguinal lymph nodes bilaterally. Electronically Signed   By: Richardean Sale M.D.   On: 10/08/2016 17:53   Genotype 08/2016 HIV-1 Subtype: B  Drug             Genotypic  Generic Name  Brand Name  Assessment      Comments  --------------------------  ----------      --------  NRTI  Abacavir    Ziagen    Sensitive   RAMs*: None  Didanosine   Videx    Sensitive   RAMs*: None   Emtricitabine  Emtriva   Sensitive   RAMs*: None  Lamivudine   Epivir    Sensitive   RAMs*: None  Stavudine    Zerit    Sensitive   RAMs*: None  Tenofovir    Viread    Sensitive   RAMs*: None  Zidovudine   Retrovir   Sensitive   RAMs*: None  NNRTI  Efavirenz    Sustiva   Sensitive   RAMs*: None  Etravirine   Intelence  Sensitive   RAMs*: None  Nevirapine   Viramune   Sensitive   RAMs*: None  Rilpivirine   Edurant   Sensitive   RAMs*: None  INI  Bictegravir   Bictegravir Sensitive   RAMs*: None  Dolutegravir  Tivicay   Sensitive   RAMs*: None  Elvitegravir  Vitekta   Sensitive   RAMs*: None  Raltegravir   Isentress  Sensitive   RAMs*: None  PI  Atazanavir   Reyataz   Sensitive   RAMs*: M36M/I  Atazanavir/r  Reyataz / r Sensitive   RAMs*: M36M/I  Darunavir/r   Prezista / r Sensitive   RAMs*: None  Fosamprenavir/r Lexiva / r  Sensitive   RAMs*: None  Indinavir/r   Crixivan / r Sensitive   RAMs*: M36M/I  Lopinavir    Kaletra   Sensitive   RAMs*: None  Nelfinavir   Viracept   Sensitive   RAMs*: M36M/I  Ritonavir    Norvir    Sensitive   RAMs*: None  Saquinavir/r  Invirase / r Sensitive   RAMs*: None  Tipranavir/r  Aptivus / r Sensitive   RAMs*: I13V, M36M/I  *RAMs = Resistance Associated Mutations observed  Summary of Mutations Observed:  RT: V60I, K82R, R83K, Q102K, I142V, C162A, K166K/R, G196E,   T200A, V276I, R277K  IN: K7R, K34R, V72I, V113I, T124T/A, T206S, I217V, S230N,   V234L  PR: I13V, M36M/I, L63P   Assessment:   RECE ZECHMAN is a 56 y.o. male with HIV, off meds for several years and last seen by ID in 2015 when he was on Prezista/ norvir and epizcom (due to renal insufficiency TDF was avoided at that time) and his VL was UD and his CD4 was 410. He states he lost his insurance and was not following with his ID  physician at [facility].  Seen by me in July when admitted with ARF and HTN, now progressed to HD dependent renal failure.  CD4 is 137-> 200 on this check. VL 9000 and pan sensitive genotype in July.  Has  not had ID fu since DC but has met with Almyra Free the HIV social worker and has been set up to be seen at [facility].  HLA [id number] test is negative.   Recommendations Once has been able to establish with [facility] so can consistently get HIV meds will have him  start doultagravir 50 mg once daily , lamividune 25 mg daily (after initial 50 mg dose) and abacavir 600 qd  Thank you very much for allowing me to participate in the care of this patient. Please call with questions.   Cheral Marker. Ola Spurr, MD

## 2016-10-09 NOTE — Progress Notes (Signed)
[place], [place] 10/09/16  Subjective:  PermCath was placed this a.m. Outpatient dialysis placement pending.  .  Objective:  Vital signs in last 24 hours:  Temp:  [97.8 F (36.6 C)-98.5 F (36.9 C)] 98.1 F (36.7 C) (08/27 1002) Pulse Rate:  [52-73] 69 (08/27 1002) Resp:  [17-24] 21 (08/27 0945) BP: (130-187)/(84-111) 137/91 (08/27 1002) SpO2:  [92 %-100 %] 98 % (08/27 1002) Weight:  [92.1 kg (203 lb)-92.2 kg (203 lb 3.2 oz)] 92.1 kg (203 lb) (08/27 0758)  Weight change:  Filed Weights   10/08/16 1419 10/09/16 0758  Weight: 92.2 kg (203 lb 3.2 oz) 92.1 kg (203 lb)    Intake/Output:    Intake/Output Summary (Last 24 hours) at 10/09/16 1115 Last data filed at 10/09/16 1017  Gross per 24 hour  Intake                0 ml  Output             1175 ml  Net            -1175 ml    Physical Exam: General: no acute distress, laying in the bed  HEENT Anicteric, moist oral mucous membranes  Neck: supple  Lungs: Normal breathing effort, clear to auscultation  Heart:: soft systolic murmur, regular  Abdomen: Soft, nontender, nondistended  Extremities: 2+ edema  Neurologic: Alert, oriented  Skin: no acute lesions      Basic Metabolic Panel:   Recent Labs Lab 10/08/16 0901 10/09/16 0405  NA 139 138  K 3.8 3.8  CL 109 110  CO2 21* 20*  GLUCOSE 93 91  BUN 94* 91*  CREATININE 7.54* 7.23*  CALCIUM 8.4* 8.5*     CBC:  Recent Labs Lab 10/08/16 0901 10/09/16 0405  WBC 4.3 4.6  NEUTROABS 2.2  --   HGB 8.9* 8.9*  HCT 26.1* 25.9*  MCV 99.4 99.8  PLT 196 189      Lab Results  Component Value Date   HEPBSAG Negative 08/23/2016   HEPBSAB Non Reactive 08/23/2016   HEPBIGM Negative 08/23/2016      Microbiology:  Recent Results (from the past 240 hour(s))  Surgical PCR screen     Status: None   Collection Time: 10/08/16  2:45 PM  Result Value Ref Range Status   MRSA, PCR NEGATIVE NEGATIVE Final   Staphylococcus aureus  NEGATIVE NEGATIVE Final    Comment:        The Xpert SA Assay (FDA approved for NASAL specimens in patients over 54 years of age), is one component of a comprehensive surveillance program.  Test performance has been validated by [facility] for patients greater than or equal to 54 year old. It is not intended to diagnose infection nor to guide or monitor treatment.     Coagulation Studies: No results for input(s): LABPROT, INR in the last 72 hours.  Urinalysis:  Recent Labs  10/08/16 1002  COLORURINE YELLOW*  LABSPEC 1.009  PHURINE 5.0  GLUCOSEU NEGATIVE  HGBUR SMALL*  BILIRUBINUR NEGATIVE  KETONESUR NEGATIVE  PROTEINUR 100*  NITRITE NEGATIVE  LEUKOCYTESUR NEGATIVE      Imaging: Dg Chest 2 View  Result Date: 10/08/2016 CLINICAL DATA:  Bilateral leg swelling. EXAM: CHEST  2 VIEW COMPARISON:  08/22/2016 FINDINGS: Old left seventh rib fracture. Few densities along the left cardiac border probably represent overlying densities. No focal airspace disease or pulmonary edema. Heart and mediastinum are within normal limits. No pleural effusions. IMPRESSION: No active  cardiopulmonary disease. Electronically Signed   By: Markus Daft M.D.   On: 10/08/2016 09:42   [place] U[place] Vein Mapping Left  Result Date: 10/09/2016 CLINICAL DATA:  Chronic kidney disease stage IV EXAM: [place] EXTREM UP VEIN MAPPING COMPARISON:  None. FINDINGS: LEFT ARTERIES Wrist Radial Artery: Size 40mm  Waveform triphasic antegrade Wrist Ulnar Artery: Size 1.59mm  Waveform triphasic antegrade Prox. Forearm Radial Artery: Size 32mm  Waveform triphasic antegrade Upper Arm Brachial Artery: Size 5.43mm  Waveform triphasic antegrade Proximal origin of the radial artery in the upper arm. LEFT VEINS Forearm Cephalic Vein: Prox 4.7ML Distal 2.53mm Depth 4.6TK Forearm Basilic Vein: Diminutive Upper Arm Cephalic Vein: Prox 3.5WS Distal 2.29mm Depth 2.3 -3.2  mm Upper Arm Basilic Vein: Prox 5.6CL  Depth 4.59mm Upper arm brachial vein 6.4 mm  proximal, 7 mm mid, 13.5 mm deep ADDITIONAL LEFT VEINS Axillary Vein:  8mm Subclavian Vein: Patient: Yes Respiratory Plascity: Present Internal Jugular Vein: Patent: Yes    Respiratory Plascity: Present Branches > 2 mm: Had while at the and has filling of right forehead. Right IJ and subclavian veins Patent: Yes  Respiratory Plascity: Present IMPRESSION: 1. Negative for left upper extremity DVT. 2. Normal flow direction and waveforms in the left brachial, radial and ulnar arteries. 3. Left upper extremity Vessel dimensions as detailed above. Electronically Signed   By: Lucrezia Europe M.D.   On: 10/09/2016 09:13   US Venous Img Lower Bilateral  Result Date: 10/08/2016 CLINICAL DATA:  56 year old with calf tenderness for 2 weeks. EXAM: BILATERAL LOWER EXTREMITY VENOUS DOPPLER ULTRASOUND TECHNIQUE: Gray-scale sonography with graded compression, as well as color Doppler and duplex ultrasound were performed to evaluate the lower extremity deep venous systems from the level of the common femoral vein and including the common femoral, femoral, profunda femoral, popliteal and calf veins including the posterior tibial, peroneal and gastrocnemius veins when visible. The superficial great saphenous vein was also interrogated. Spectral Doppler was utilized to evaluate flow at rest and with distal augmentation maneuvers in the common femoral, femoral and popliteal veins. COMPARISON:  Right lower extremity venous Doppler ultrasound 05/11/2016. FINDINGS: RIGHT LOWER EXTREMITY Common Femoral Vein: No evidence of thrombus. Normal compressibility, respiratory phasicity and response to augmentation. Saphenofemoral Junction: No evidence of thrombus. Normal compressibility and flow on color Doppler imaging. Profunda Femoral Vein: No evidence of thrombus. Normal compressibility and flow on color Doppler imaging. Femoral Vein: No evidence of thrombus. Normal compressibility, respiratory phasicity and response to augmentation. Popliteal  Vein: No evidence of thrombus. Normal compressibility, respiratory phasicity and response to augmentation. Calf Veins: No evidence of thrombus. Normal compressibility and flow on color Doppler imaging. Superficial Great Saphenous Vein: No evidence of thrombus. Normal compressibility and flow on color Doppler imaging. Other Findings: There is a large complex cystic lesion posterior to the right knee, consistent with a Baker's cyst. This measures approximately 13.2 x 2.8 x 4.5 cm. This is not significantly changed from the previous study. Mildly prominent inguinal lymph nodes are present with retained fatty hila, likely reactive. LEFT LOWER EXTREMITY Common Femoral Vein: No evidence of thrombus. Normal compressibility, respiratory phasicity and response to augmentation. Saphenofemoral Junction: No evidence of thrombus. Normal compressibility and flow on color Doppler imaging. Profunda Femoral Vein: No evidence of thrombus. Normal compressibility and flow on color Doppler imaging. Femoral Vein: No evidence of thrombus. Normal compressibility, respiratory phasicity and response to augmentation. Popliteal Vein: No evidence of thrombus. Normal compressibility, respiratory phasicity and response to augmentation. Calf Veins: No evidence of thrombus.  Normal compressibility and flow on color Doppler imaging. Superficial Great Saphenous Vein: No evidence of thrombus. Normal compressibility and flow on color Doppler imaging. Other Findings: Mildly prominent inguinal lymph nodes are present with retained fatty hila, likely reactive. IMPRESSION: 1. No evidence of DVT within either lower extremity. 2. Stable large Baker's cyst on the right. 3. Reactive inguinal lymph nodes bilaterally. Electronically Signed   By: Richardean Sale M.D.   On: 10/08/2016 17:53     Medications:   Current Meds  Medication Sig  . amLODipine (NORVASC) 10 MG tablet Take 1 tablet (10 mg total) by mouth daily.  Marland Kitchen atenolol (TENORMIN) 50 MG tablet  Take 50 mg by mouth 2 (two) times daily.  . cloNIDine (CATAPRES) 0.1 MG tablet Take 1 tablet (0.1 mg total) by mouth 2 (two) times daily.  . colchicine 0.6 MG tablet Take 1 tablet (0.6 mg total) by mouth daily as needed.  . desoximetasone (TOPICORT) 0.25 % cream Apply 1 application topically 2 (two) times daily.  . hydrALAZINE (APRESOLINE) 100 MG tablet Take 1 tablet (100 mg total) by mouth 3 (three) times daily.  . hydrOXYzine (ATARAX/VISTARIL) 50 MG tablet Take 1 tablet (50 mg total) by mouth 3 (three) times daily as needed.  . loratadine (CLARITIN) 10 MG tablet Take 10 mg by mouth daily as needed for allergies.  Marland Kitchen zolpidem (AMBIEN) 5 MG tablet Take 1 tablet (5 mg total) by mouth at bedtime.      Assessment/ Plan:  56 y.o. male with poorly controlled hypertension, HIV no meds for 2 years, gout,h/o NSAID use was admitted on 08/22/2016 .   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  Anemia of CKD 2.  Poorly controlled hypertension with Edema 4.  HIV.  No meds for 2 years 5.  Hyperuricemia, gout  Patient has progressively worsening kidney disease for the past year or so.  His baseline creatinine was 1.5/GFR 60 back in July of 2017.  Since then, his creatinine has been increasing.  His admission creatinine is 5.4. He has proteinuria of 2 g.  Patient has history of significant nonsteroidal use.  He also has HIV and has not been taking his medication for the past 2 years.  Differential diagnosis of worsening renal failure includes HIV nephropathy, FSGS, interstitial nephritis, NSAID-induced nephropathy, hypertension nephrosclerosis  Patient has progressively worsening renal failure. He has likely progressed to ESRD. He has volume overload as evidenced by edema and uncontrolled HTN. Risks benefits and alternatives to starting dialysis were discussed. All questions answered. Patient has agreed to proceed.  Plan: PermCath has been placed. Patient will be due for his first dialysis session today. As above  he appears to have end-stage renal disease. Infectious disease consultation also currently pending to that the patient can restart treatment for HIV. We will also consider Epogen for treatment of his anemia of chronic kidney disease. He most likely has secondary hyperparathyroidism as well. We will continue to follow his progress closely.    LOS: 1 Tashae Inda 8/27/201811:15 AM  [place], [place]

## 2016-10-09 NOTE — Progress Notes (Signed)
Pre-Hemodialysis 

## 2016-10-09 NOTE — Progress Notes (Signed)
[facility] [place] NAME: Louis [name]    MR#:  [id number]  DATE OF BIRTH:  20-Jan-1961  SUBJECTIVE:  Doing well. No new complaints S/p perm cath placement  REVIEW OF SYSTEMS:   Review of Systems  Constitutional: Negative for chills, fever and weight loss.  HENT: Negative for ear discharge, ear pain and nosebleeds.   Eyes: Negative for blurred vision, pain and discharge.  Respiratory: Negative for sputum production, shortness of breath, wheezing and stridor.   Cardiovascular: Negative for chest pain, palpitations, orthopnea and PND.  Gastrointestinal: Negative for abdominal pain, diarrhea, nausea and vomiting.  Genitourinary: Negative for frequency and urgency.  Musculoskeletal: Negative for back pain and joint pain.  Neurological: Positive for weakness. Negative for sensory change, speech change and focal weakness.  Psychiatric/Behavioral: Negative for depression and hallucinations. The patient is not nervous/anxious.    Tolerating Diet:yes Tolerating PT: pt ambulatory  DRUG ALLERGIES:   Allergies  Allergen Reactions  . Quinapril Hcl Swelling    Face swells    VITALS:  Blood pressure (!) 137/91, pulse 69, temperature 98.1 F (36.7 C), temperature source Oral, resp. rate (!) 21, height 6\' 2"  (1.88 m), weight 92.1 kg (203 lb), SpO2 98 %.  PHYSICAL EXAMINATION:   Physical Exam  GENERAL:  56 y.o.-year-old patient lying in the bed with no acute distress.  EYES: Pupils equal, round, reactive to light and accommodation. No scleral icterus. Extraocular muscles intact.  HEENT: Head atraumatic, normocephalic. Oropharynx and nasopharynx clear.  NECK:  Supple, no jugular venous distention. No thyroid enlargement, no tenderness.  LUNGS: Normal breath sounds bilaterally, no wheezing, rales, rhonchi. No use of accessory muscles of respiration.right Upper chest perm cath placement.  CARDIOVASCULAR: S1, S2 normal. No murmurs, rubs,  or gallops.  ABDOMEN: Soft, nontender, nondistended. Bowel sounds present. No organomegaly or mass.  EXTREMITIES: No cyanosis, clubbing or edema b/l.    NEUROLOGIC: Cranial nerves II through XII are intact. No focal Motor or sensory deficits b/l.   PSYCHIATRIC:  patient is alert and oriented x 3.  SKIN: No obvious rash, lesion, or ulcer.   LABORATORY PANEL:  CBC  Recent Labs Lab 10/09/16 0405  WBC 4.6  HGB 8.9*  HCT 25.9*  PLT 189    Chemistries   Recent Labs Lab 10/09/16 0405  NA 138  K 3.8  CL 110  CO2 20*  GLUCOSE 91  BUN 91*  CREATININE 7.23*  CALCIUM 8.5*  AST 24  ALT 10*  ALKPHOS 40  BILITOT 0.4   Cardiac Enzymes  Recent Labs Lab 10/08/16 0901  TROPONINI <0.03   RADIOLOGY:  Dg Chest 2 View  Result Date: 10/08/2016 CLINICAL DATA:  Bilateral leg swelling. EXAM: CHEST  2 VIEW COMPARISON:  08/22/2016 FINDINGS: Old left seventh rib fracture. Few densities along the left cardiac border probably represent overlying densities. No focal airspace disease or pulmonary edema. Heart and mediastinum are within normal limits. No pleural effusions. IMPRESSION: No active cardiopulmonary disease. Electronically Signed   By: Markus Daft M.D.   On: 10/08/2016 09:42   [place] Ue Vein Mapping Left  Result Date: 10/09/2016 CLINICAL DATA:  Chronic kidney disease stage IV EXAM: [place] EXTREM UP VEIN MAPPING COMPARISON:  None. FINDINGS: LEFT ARTERIES Wrist Radial Artery: Size 52mm  Waveform triphasic antegrade Wrist Ulnar Artery: Size 1.42mm  Waveform triphasic antegrade Prox. Forearm Radial Artery: Size 63mm  Waveform triphasic antegrade Upper Arm Brachial Artery: Size 5.24mm  Waveform triphasic antegrade Proximal origin of  the radial artery in the upper arm. LEFT VEINS Forearm Cephalic Vein: Prox 7.3UK Distal 2.76mm Depth 0.2RK Forearm Basilic Vein: Diminutive Upper Arm Cephalic Vein: Prox 2.7CW Distal 2.60mm Depth 2.3 -3.2  mm Upper Arm Basilic Vein: Prox 2.3JS  Depth 4.46mm Upper arm brachial vein  6.4 mm proximal, 7 mm mid, 13.5 mm deep ADDITIONAL LEFT VEINS Axillary Vein:  27mm Subclavian Vein: Patient: Yes Respiratory Plascity: Present Internal Jugular Vein: Patent: Yes    Respiratory Plascity: Present Branches > 2 mm: Had while at the and has filling of right forehead. Right IJ and subclavian veins Patent: Yes  Respiratory Plascity: Present IMPRESSION: 1. Negative for left upper extremity DVT. 2. Normal flow direction and waveforms in the left brachial, radial and ulnar arteries. 3. Left upper extremity Vessel dimensions as detailed above. Electronically Signed   By: Lucrezia Europe M.D.   On: 10/09/2016 09:13   US Venous Img Lower Bilateral  Result Date: 10/08/2016 CLINICAL DATA:  56 year old with calf tenderness for 2 weeks. EXAM: BILATERAL LOWER EXTREMITY VENOUS DOPPLER ULTRASOUND TECHNIQUE: Gray-scale sonography with graded compression, as well as color Doppler and duplex ultrasound were performed to evaluate the lower extremity deep venous systems from the level of the common femoral vein and including the common femoral, femoral, profunda femoral, popliteal and calf veins including the posterior tibial, peroneal and gastrocnemius veins when visible. The superficial great saphenous vein was also interrogated. Spectral Doppler was utilized to evaluate flow at rest and with distal augmentation maneuvers in the common femoral, femoral and popliteal veins. COMPARISON:  Right lower extremity venous Doppler ultrasound 05/11/2016. FINDINGS: RIGHT LOWER EXTREMITY Common Femoral Vein: No evidence of thrombus. Normal compressibility, respiratory phasicity and response to augmentation. Saphenofemoral Junction: No evidence of thrombus. Normal compressibility and flow on color Doppler imaging. Profunda Femoral Vein: No evidence of thrombus. Normal compressibility and flow on color Doppler imaging. Femoral Vein: No evidence of thrombus. Normal compressibility, respiratory phasicity and response to augmentation.  Popliteal Vein: No evidence of thrombus. Normal compressibility, respiratory phasicity and response to augmentation. Calf Veins: No evidence of thrombus. Normal compressibility and flow on color Doppler imaging. Superficial Great Saphenous Vein: No evidence of thrombus. Normal compressibility and flow on color Doppler imaging. Other Findings: There is a large complex cystic lesion posterior to the right knee, consistent with a Baker's cyst. This measures approximately 13.2 x 2.8 x 4.5 cm. This is not significantly changed from the previous study. Mildly prominent inguinal lymph nodes are present with retained fatty hila, likely reactive. LEFT LOWER EXTREMITY Common Femoral Vein: No evidence of thrombus. Normal compressibility, respiratory phasicity and response to augmentation. Saphenofemoral Junction: No evidence of thrombus. Normal compressibility and flow on color Doppler imaging. Profunda Femoral Vein: No evidence of thrombus. Normal compressibility and flow on color Doppler imaging. Femoral Vein: No evidence of thrombus. Normal compressibility, respiratory phasicity and response to augmentation. Popliteal Vein: No evidence of thrombus. Normal compressibility, respiratory phasicity and response to augmentation. Calf Veins: No evidence of thrombus. Normal compressibility and flow on color Doppler imaging. Superficial Great Saphenous Vein: No evidence of thrombus. Normal compressibility and flow on color Doppler imaging. Other Findings: Mildly prominent inguinal lymph nodes are present with retained fatty hila, likely reactive. IMPRESSION: 1. No evidence of DVT within either lower extremity. 2. Stable large Baker's cyst on the right. 3. Reactive inguinal lymph nodes bilaterally. Electronically Signed   By: Richardean Sale M.D.   On: 10/08/2016 17:53   ASSESSMENT AND PLAN:  Louis [name]  is a  56 y.o. male with a known history of HIV chronic kidney disease, hypertension, gout is noncompliant with the  medications including HIV meds and presenting to the emergency department with a chief complaint of leg swelling. Denies any chest pain or shortness of breath. 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  -Appreciated vascular surgery Dr Lucky Cowboy for dialysis catheter placement -Consulted nephrology for hemodialysis, patient does not have hyperkalemia and hemodynamically stable -pt to be started on HD today Baseline creatinine 1.5 in July 2017 Stop NSAIDs and other nephrotoxins  # chronic history of HIV noncompliant with meds -Check HIV RNA and CD4 count -Consults infectious disease -Currently not on any medications for HIV, patient stopped taking  #Anemia of chronic kidney disease Monitor hemoglobin and transfuse as needed  #essential hypertension with noncompliance with meds Resume home meds amlodipine, atenolol, clonidine and hydralazine; uptitrate as needed  #bilateral lower extremity edema with tenderness Ruled out DVT with negatvie venous Dopplers Lasix as needed, encouraged patient to keep his legs elevated   Case discussed with Care Management/Social Worker. Management plans discussed with the patient, family and they are in agreement.  CODE STATUS: full  DVT Prophylaxis: Heparin  TOTAL TIME TAKING CARE OF THIS PATIENT: 30 minutes.  >50% time spent on counselling and coordination of care  POSSIBLE D/C IN *2-3* DAYS, DEPENDING ON CLINICAL CONDITION.  Note: This dictation was prepared with Dragon dictation along with smaller phrase technology. Any transcriptional errors that result from this process are unintentional.  Hermie Reagor M.D on 10/09/2016 at 11:33 AM  Between 7am to 6pm - Pager - [phone]  After 6pm go to [email or web address] - password [email or web address] [facility]  Office  [phone]  CC: Primary care physician;  Roselee Nova, MD

## 2016-10-09 NOTE — Progress Notes (Signed)
Hemodialysis started

## 2016-10-09 NOTE — H&P (Signed)
 [facility] History & Physical Update  The patient was interviewed and re-examined.  The patient's previous History and Physical has been reviewed and is unchanged.  There is no change in the plan of care. We plan to proceed with the scheduled procedure.  Leotis Pain, MD  10/09/2016, 8:16 AM

## 2016-10-10 LAB — HELPER T-LYMPH-CD4 (ARMC ONLY)
% CD 4 Pos. Lymph.: 14.8 % — ABNORMAL LOW (ref 30.8–58.5)
Absolute CD 4 Helper: 207 /uL — ABNORMAL LOW (ref 359–1519)
Basophils Absolute: 0 10*3/uL (ref 0.0–0.2)
Basos: 0 %
EOS (ABSOLUTE): 0.1 10*3/uL (ref 0.0–0.4)
Eos: 3 %
Hematocrit: 26.8 % — ABNORMAL LOW (ref 37.5–51.0)
Hemoglobin: 8.8 g/dL — ABNORMAL LOW (ref 13.0–17.7)
Immature Grans (Abs): 0 10*3/uL (ref 0.0–0.1)
Immature Granulocytes: 0 %
Lymphocytes Absolute: 1.4 10*3/uL (ref 0.7–3.1)
Lymphs: 32 %
MCH: 32.8 pg (ref 26.6–33.0)
MCHC: 32.8 g/dL (ref 31.5–35.7)
MCV: 100 fL — ABNORMAL HIGH (ref 79–97)
Monocytes Absolute: 0.4 10*3/uL (ref 0.1–0.9)
Monocytes: 8 %
Neutrophils Absolute: 2.6 10*3/uL (ref 1.4–7.0)
Neutrophils: 57 %
Platelets: 213 10*3/uL (ref 150–379)
RBC: 2.68 x10E6/uL — ABNORMAL LOW (ref 4.14–5.80)
RDW: 14.4 % (ref 12.3–15.4)
WBC: 4.5 10*3/uL (ref 3.4–10.8)

## 2016-10-10 LAB — HEPATITIS B SURFACE ANTIBODY,QUALITATIVE: Hep B S Ab: NONREACTIVE

## 2016-10-10 LAB — HIV-1 RNA QUANT-NO REFLEX-BLD
HIV 1 RNA Quant: 8930 copies/mL
LOG10 HIV-1 RNA: 3.951 log10copy/mL

## 2016-10-10 LAB — URIC ACID: Uric Acid, Serum: 8 mg/dL — ABNORMAL HIGH (ref 4.4–7.6)

## 2016-10-10 LAB — HEPATITIS B SURFACE ANTIGEN: Hepatitis B Surface Ag: NEGATIVE

## 2016-10-10 LAB — HEPATITIS B CORE ANTIBODY, TOTAL: Hep B Core Total Ab: NEGATIVE

## 2016-10-10 MED ORDER — PREDNISONE 20 MG PO TABS: 40.0000 mg | ORAL_TABLET | Freq: Every day | ORAL | Status: DC

## 2016-10-10 MED ORDER — EPOETIN ALFA 10000 UNIT/ML IJ SOLN
4000.0000 [IU] | INTRAMUSCULAR | Status: DC
  Administered 2016-10-10: 4000 [IU] via INTRAVENOUS

## 2016-10-10 MED ORDER — PREDNISONE 20 MG PO TABS
40.0000 mg | ORAL_TABLET | Freq: Every day | ORAL | Status: DC
  Administered 2016-10-10 – 2016-10-11 (×2): 40 mg via ORAL
  Filled 2016-10-10 (×2): qty 2

## 2016-10-10 NOTE — Progress Notes (Signed)
[place], [place] 10/10/16  Subjective:  Patient will be due for another dialysis session today. He reports that he is being troubled with gout symptoms at the moment.    Objective:  Vital signs in last 24 hours:  Temp:  [97.7 F (36.5 C)-98.6 F (37 C)] 98.6 F (37 C) (08/28 0445) Pulse Rate:  [54-76] 76 (08/28 0445) Resp:  [16-20] 18 (08/28 0445) BP: (135-160)/(91-107) 156/98 (08/28 0445) SpO2:  [96 %-100 %] 99 % (08/28 0445) Weight:  [92.1 kg (203 lb 0.7 oz)] 92.1 kg (203 lb 0.7 oz) (08/27 1405)  Weight change: -0.091 kg (-3.2 oz) Filed Weights   10/08/16 1419 10/09/16 0758 10/09/16 1405  Weight: 92.2 kg (203 lb 3.2 oz) 92.1 kg (203 lb) 92.1 kg (203 lb 0.7 oz)    Intake/Output:    Intake/Output Summary (Last 24 hours) at 10/10/16 1143 Last data filed at 10/10/16 1044  Gross per 24 hour  Intake              120 ml  Output             1275 ml  Net            -1155 ml    Physical Exam: General: no acute distress, laying in the bed  HEENT Anicteric, moist oral mucous membranes  Neck: supple  Lungs: Normal breathing effort, clear to auscultation  Heart:: S1S2, regular  Abdomen: Soft, nontender, nondistended  Extremities: 2+ edema  Neurologic: Alert, oriented  Skin: no acute lesions      Basic Metabolic Panel:   Recent Labs Lab 10/08/16 0901 10/09/16 0405 10/09/16 1500  NA 139 138  --   K 3.8 3.8  --   CL 109 110  --   CO2 21* 20*  --   GLUCOSE 93 91  --   BUN 94* 91*  --   CREATININE 7.54* 7.23*  --   CALCIUM 8.4* 8.5*  --   PHOS  --   --  3.9     CBC:  Recent Labs Lab 10/08/16 0901 10/09/16 0405  WBC 4.3 4.6  NEUTROABS 2.2  --   HGB 8.9* 8.9*  HCT 26.1* 25.9*  MCV 99.4 99.8  PLT 196 189      Lab Results  Component Value Date   HEPBSAG Negative 10/09/2016   HEPBSAB Non Reactive 10/09/2016   HEPBIGM Negative 08/23/2016      Microbiology:  Recent Results (from the past 240 hour(s))  Surgical  PCR screen     Status: None   Collection Time: 10/08/16  2:45 PM  Result Value Ref Range Status   MRSA, PCR NEGATIVE NEGATIVE Final   Staphylococcus aureus NEGATIVE NEGATIVE Final    Comment:        The Xpert SA Assay (FDA approved for NASAL specimens in patients over 13 years of age), is one component of a comprehensive surveillance program.  Test performance has been validated by [facility] for patients greater than or equal to 16 year old. It is not intended to diagnose infection nor to guide or monitor treatment.     Coagulation Studies: No results for input(s): LABPROT, INR in the last 72 hours.  Urinalysis:  Recent Labs  10/08/16 1002  COLORURINE YELLOW*  LABSPEC 1.009  PHURINE 5.0  GLUCOSEU NEGATIVE  HGBUR SMALL*  BILIRUBINUR NEGATIVE  KETONESUR NEGATIVE  PROTEINUR 100*  NITRITE NEGATIVE  LEUKOCYTESUR NEGATIVE      Imaging: [place] Ue Vein Mapping Left  Result Date: 10/09/2016 CLINICAL DATA:  Chronic kidney disease stage IV EXAM: [place] EXTREM UP VEIN MAPPING COMPARISON:  None. FINDINGS: LEFT ARTERIES Wrist Radial Artery: Size 43mm  Waveform triphasic antegrade Wrist Ulnar Artery: Size 1.69mm  Waveform triphasic antegrade Prox. Forearm Radial Artery: Size 86mm  Waveform triphasic antegrade Upper Arm Brachial Artery: Size 5.91mm  Waveform triphasic antegrade Proximal origin of the radial artery in the upper arm. LEFT VEINS Forearm Cephalic Vein: Prox 8.2XH Distal 2.61mm Depth 3.7JI Forearm Basilic Vein: Diminutive Upper Arm Cephalic Vein: Prox 9.6VE Distal 2.74mm Depth 2.3 -3.2  mm Upper Arm Basilic Vein: Prox 9.3YB  Depth 4.43mm Upper arm brachial vein 6.4 mm proximal, 7 mm mid, 13.5 mm deep ADDITIONAL LEFT VEINS Axillary Vein:  42mm Subclavian Vein: Patient: Yes Respiratory Plascity: Present Internal Jugular Vein: Patent: Yes    Respiratory Plascity: Present Branches > 2 mm: Had while at the and has filling of right forehead. Right IJ and subclavian veins Patent: Yes  Respiratory  Plascity: Present IMPRESSION: 1. Negative for left upper extremity DVT. 2. Normal flow direction and waveforms in the left brachial, radial and ulnar arteries. 3. Left upper extremity Vessel dimensions as detailed above. Electronically Signed   By: Lucrezia Europe M.D.   On: 10/09/2016 09:13   US Venous Img Lower Bilateral  Result Date: 10/08/2016 CLINICAL DATA:  56 year old with calf tenderness for 2 weeks. EXAM: BILATERAL LOWER EXTREMITY VENOUS DOPPLER ULTRASOUND TECHNIQUE: Gray-scale sonography with graded compression, as well as color Doppler and duplex ultrasound were performed to evaluate the lower extremity deep venous systems from the level of the common femoral vein and including the common femoral, femoral, profunda femoral, popliteal and calf veins including the posterior tibial, peroneal and gastrocnemius veins when visible. The superficial great saphenous vein was also interrogated. Spectral Doppler was utilized to evaluate flow at rest and with distal augmentation maneuvers in the common femoral, femoral and popliteal veins. COMPARISON:  Right lower extremity venous Doppler ultrasound 05/11/2016. FINDINGS: RIGHT LOWER EXTREMITY Common Femoral Vein: No evidence of thrombus. Normal compressibility, respiratory phasicity and response to augmentation. Saphenofemoral Junction: No evidence of thrombus. Normal compressibility and flow on color Doppler imaging. Profunda Femoral Vein: No evidence of thrombus. Normal compressibility and flow on color Doppler imaging. Femoral Vein: No evidence of thrombus. Normal compressibility, respiratory phasicity and response to augmentation. Popliteal Vein: No evidence of thrombus. Normal compressibility, respiratory phasicity and response to augmentation. Calf Veins: No evidence of thrombus. Normal compressibility and flow on color Doppler imaging. Superficial Great Saphenous Vein: No evidence of thrombus. Normal compressibility and flow on color Doppler imaging. Other  Findings: There is a large complex cystic lesion posterior to the right knee, consistent with a Baker's cyst. This measures approximately 13.2 x 2.8 x 4.5 cm. This is not significantly changed from the previous study. Mildly prominent inguinal lymph nodes are present with retained fatty hila, likely reactive. LEFT LOWER EXTREMITY Common Femoral Vein: No evidence of thrombus. Normal compressibility, respiratory phasicity and response to augmentation. Saphenofemoral Junction: No evidence of thrombus. Normal compressibility and flow on color Doppler imaging. Profunda Femoral Vein: No evidence of thrombus. Normal compressibility and flow on color Doppler imaging. Femoral Vein: No evidence of thrombus. Normal compressibility, respiratory phasicity and response to augmentation. Popliteal Vein: No evidence of thrombus. Normal compressibility, respiratory phasicity and response to augmentation. Calf Veins: No evidence of thrombus. Normal compressibility and flow on color Doppler imaging. Superficial Great Saphenous Vein: No evidence of thrombus. Normal compressibility and flow on color Doppler imaging.  Other Findings: Mildly prominent inguinal lymph nodes are present with retained fatty hila, likely reactive. IMPRESSION: 1. No evidence of DVT within either lower extremity. 2. Stable large Baker's cyst on the right. 3. Reactive inguinal lymph nodes bilaterally. Electronically Signed   By: Richardean Sale M.D.   On: 10/08/2016 17:53     Medications:   Current Meds  Medication Sig  . amLODipine (NORVASC) 10 MG tablet Take 1 tablet (10 mg total) by mouth daily.  Marland Kitchen atenolol (TENORMIN) 50 MG tablet Take 50 mg by mouth 2 (two) times daily.  . cloNIDine (CATAPRES) 0.1 MG tablet Take 1 tablet (0.1 mg total) by mouth 2 (two) times daily.  . colchicine 0.6 MG tablet Take 1 tablet (0.6 mg total) by mouth daily as needed.  . desoximetasone (TOPICORT) 0.25 % cream Apply 1 application topically 2 (two) times daily.  .  hydrALAZINE (APRESOLINE) 100 MG tablet Take 1 tablet (100 mg total) by mouth 3 (three) times daily.  . hydrOXYzine (ATARAX/VISTARIL) 50 MG tablet Take 1 tablet (50 mg total) by mouth 3 (three) times daily as needed.  . loratadine (CLARITIN) 10 MG tablet Take 10 mg by mouth daily as needed for allergies.  Marland Kitchen zolpidem (AMBIEN) 5 MG tablet Take 1 tablet (5 mg total) by mouth at bedtime.      Assessment/ Plan:  56 y.o. male with poorly controlled hypertension, HIV no meds for 2 years, gout,h/o NSAID use was admitted on 08/22/2016 .   1.  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 2.  Anemia of CKD, hgb 8.9 2.  Poorly controlled hypertension with Edema 4.  HIV.  No meds for 2 years 5.  Hyperuricemia, gout  Patient has progressively worsening kidney disease for the past year or so.  His baseline creatinine was 1.5/GFR 60 back in July of 2017.  Since then, his creatinine has been increasing.  His admission creatinine is 5.4. He has proteinuria of 2 g.  Patient has history of significant nonsteroidal use.  He also has HIV and has not been taking his medication for the past 2 years.  Differential diagnosis of worsening renal failure includes HIV nephropathy, FSGS, interstitial nephritis, NSAID-induced nephropathy, hypertension nephrosclerosis   Plan: Patient tolerated his first dialysis treatment quite well. We will plan for second dialysis treatment today. Hemoglobin low at 8.9. We will start the patient on Epogen 4000 units IV with dialysis. We will continue the patient on amlodipine, atenolol, clonidine, and hydralazine for blood pressure control. We will also perform ultrafiltration with dialysis today. He reports that his gout symptoms have worsened. We will start the patient on prednisone 40 mg by mouth daily 4 days.   LOS: 2 Moet Mikulski 8/28/201811:43 AM  [facility] [place], [place]

## 2016-10-10 NOTE — Progress Notes (Signed)
Hd initiated without issue via R Chest HD cath. Patient denies questions about hd currently.

## 2016-10-10 NOTE — Progress Notes (Signed)
Pre hd 

## 2016-10-10 NOTE — Progress Notes (Signed)
HD completed without issue. Patient tolerated well. Goal met

## 2016-10-10 NOTE — Progress Notes (Signed)
[name] at [place] NAME: [name]    MR#:  [id number]  DATE OF BIRTH:  Oct 03, 1960  SUBJECTIVE:  Doing well.right knee pain S/p perm cath placement  REVIEW OF SYSTEMS:   Review of Systems  Constitutional: Negative for chills, fever and weight loss.  HENT: Negative for ear discharge, ear pain and nosebleeds.   Eyes: Negative for blurred vision, pain and discharge.  Respiratory: Negative for sputum production, shortness of breath, wheezing and stridor.   Cardiovascular: Negative for chest pain, palpitations, orthopnea and PND.  Gastrointestinal: Negative for abdominal pain, diarrhea, nausea and vomiting.  Genitourinary: Negative for frequency and urgency.  Musculoskeletal: Positive for joint pain. Negative for back pain.  Neurological: Positive for weakness. Negative for sensory change, speech change and focal weakness.  Psychiatric/Behavioral: Negative for depression and hallucinations. The patient is not nervous/anxious.    Tolerating Diet:yes Tolerating PT: pt ambulatory  DRUG ALLERGIES:   Allergies  Allergen Reactions  . Quinapril Hcl Swelling    Face swells    VITALS:  Blood pressure (!) 156/98, pulse 76, temperature 98.6 F (37 C), temperature source Oral, resp. rate 18, height 6\' 2"  (1.88 m), weight 92.1 kg (203 lb 0.7 oz), SpO2 99 %.  PHYSICAL EXAMINATION:   Physical Exam  GENERAL:  56 y.o.-year-old patient lying in the bed with no acute distress.  EYES: Pupils equal, round, reactive to light and accommodation. No scleral icterus. Extraocular muscles intact.  HEENT: Head atraumatic, normocephalic. Oropharynx and nasopharynx clear.  NECK:  Supple, no jugular venous distention. No thyroid enlargement, no tenderness.  LUNGS: Normal breath sounds bilaterally, no wheezing, rales, rhonchi. No use of accessory muscles of respiration.right Upper chest perm cath placement.  CARDIOVASCULAR: S1, S2 normal. No  murmurs, rubs, or gallops.  ABDOMEN: Soft, nontender, nondistended. Bowel sounds present. No organomegaly or mass.  EXTREMITIES: No cyanosis, clubbing or edema b/l.    NEUROLOGIC: Cranial nerves II through XII are intact. No focal Motor or sensory deficits b/l.   PSYCHIATRIC:  patient is alert and oriented x 3.  SKIN: No obvious rash, lesion, or ulcer.   LABORATORY PANEL:  CBC  Recent Labs Lab 10/09/16 0405  WBC 4.6  HGB 8.9*  HCT 25.9*  PLT 189    Chemistries   Recent Labs Lab 10/09/16 0405  NA 138  K 3.8  CL 110  CO2 20*  GLUCOSE 91  BUN 91*  CREATININE 7.23*  CALCIUM 8.5*  AST 24  ALT 10*  ALKPHOS 40  BILITOT 0.4   Cardiac Enzymes  Recent Labs Lab 10/08/16 0901  TROPONINI <0.03   RADIOLOGY:  Dg Chest 2 View  Result Date: 10/08/2016 CLINICAL DATA:  Bilateral leg swelling. EXAM: CHEST  2 VIEW COMPARISON:  08/22/2016 FINDINGS: Old left seventh rib fracture. Few densities along the left cardiac border probably represent overlying densities. No focal airspace disease or pulmonary edema. Heart and mediastinum are within normal limits. No pleural effusions. IMPRESSION: No active cardiopulmonary disease. Electronically Signed   By: Markus Daft M.D.   On: 10/08/2016 09:42   [place] Ue Vein Mapping Left  Result Date: 10/09/2016 CLINICAL DATA:  Chronic kidney disease stage IV EXAM: [place] EXTREM UP VEIN MAPPING COMPARISON:  None. FINDINGS: LEFT ARTERIES Wrist Radial Artery: Size 10mm  Waveform triphasic antegrade Wrist Ulnar Artery: Size 1.24mm  Waveform triphasic antegrade Prox. Forearm Radial Artery: Size 61mm  Waveform triphasic antegrade Upper Arm Brachial Artery: Size 5.8mm  Waveform triphasic antegrade Proximal origin  of the radial artery in the upper arm. LEFT VEINS Forearm Cephalic Vein: Prox 1.6XW Distal 2.61mm Depth 9.6EA Forearm Basilic Vein: Diminutive Upper Arm Cephalic Vein: Prox 5.4UJ Distal 2.67mm Depth 2.3 -3.2  mm Upper Arm Basilic Vein: Prox 8.1XB  Depth 4.30mm Upper arm  brachial vein 6.4 mm proximal, 7 mm mid, 13.5 mm deep ADDITIONAL LEFT VEINS Axillary Vein:  77mm Subclavian Vein: Patient: Yes Respiratory Plascity: Present Internal Jugular Vein: Patent: Yes    Respiratory Plascity: Present Branches > 2 mm: Had while at the and has filling of right forehead. Right IJ and subclavian veins Patent: Yes  Respiratory Plascity: Present IMPRESSION: 1. Negative for left upper extremity DVT. 2. Normal flow direction and waveforms in the left brachial, radial and ulnar arteries. 3. Left upper extremity Vessel dimensions as detailed above. Electronically Signed   By: Lucrezia Europe M.D.   On: 10/09/2016 09:13   US Venous Img Lower Bilateral  Result Date: 10/08/2016 CLINICAL DATA:  56 year old with calf tenderness for 2 weeks. EXAM: BILATERAL LOWER EXTREMITY VENOUS DOPPLER ULTRASOUND TECHNIQUE: Gray-scale sonography with graded compression, as well as color Doppler and duplex ultrasound were performed to evaluate the lower extremity deep venous systems from the level of the common femoral vein and including the common femoral, femoral, profunda femoral, popliteal and calf veins including the posterior tibial, peroneal and gastrocnemius veins when visible. The superficial great saphenous vein was also interrogated. Spectral Doppler was utilized to evaluate flow at rest and with distal augmentation maneuvers in the common femoral, femoral and popliteal veins. COMPARISON:  Right lower extremity venous Doppler ultrasound 05/11/2016. FINDINGS: RIGHT LOWER EXTREMITY Common Femoral Vein: No evidence of thrombus. Normal compressibility, respiratory phasicity and response to augmentation. Saphenofemoral Junction: No evidence of thrombus. Normal compressibility and flow on color Doppler imaging. Profunda Femoral Vein: No evidence of thrombus. Normal compressibility and flow on color Doppler imaging. Femoral Vein: No evidence of thrombus. Normal compressibility, respiratory phasicity and response to  augmentation. Popliteal Vein: No evidence of thrombus. Normal compressibility, respiratory phasicity and response to augmentation. Calf Veins: No evidence of thrombus. Normal compressibility and flow on color Doppler imaging. Superficial Great Saphenous Vein: No evidence of thrombus. Normal compressibility and flow on color Doppler imaging. Other Findings: There is a large complex cystic lesion posterior to the right knee, consistent with a Baker's cyst. This measures approximately 13.2 x 2.8 x 4.5 cm. This is not significantly changed from the previous study. Mildly prominent inguinal lymph nodes are present with retained fatty hila, likely reactive. LEFT LOWER EXTREMITY Common Femoral Vein: No evidence of thrombus. Normal compressibility, respiratory phasicity and response to augmentation. Saphenofemoral Junction: No evidence of thrombus. Normal compressibility and flow on color Doppler imaging. Profunda Femoral Vein: No evidence of thrombus. Normal compressibility and flow on color Doppler imaging. Femoral Vein: No evidence of thrombus. Normal compressibility, respiratory phasicity and response to augmentation. Popliteal Vein: No evidence of thrombus. Normal compressibility, respiratory phasicity and response to augmentation. Calf Veins: No evidence of thrombus. Normal compressibility and flow on color Doppler imaging. Superficial Great Saphenous Vein: No evidence of thrombus. Normal compressibility and flow on color Doppler imaging. Other Findings: Mildly prominent inguinal lymph nodes are present with retained fatty hila, likely reactive. IMPRESSION: 1. No evidence of DVT within either lower extremity. 2. Stable large Baker's cyst on the right. 3. Reactive inguinal lymph nodes bilaterally. Electronically Signed   By: Richardean Sale M.D.   On: 10/08/2016 17:53   ASSESSMENT AND PLAN:  Scotland Korver  is  a 56 y.o. male with a known history of HIV chronic kidney disease, hypertension, gout is noncompliant  with the medications including HIV meds and presenting to the emergency department with a chief complaint of leg swelling. Denies any chest pain or shortness of breath. 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  -Appreciated vascular surgery Dr Lucky Cowboy for dialysis catheter placement -Consulted nephrology for hemodialysis, patient does not have hyperkalemia and hemodynamically stable -pt to be started on HD Aug 27th Baseline creatinine 1.5 in July 2017 Stop NSAIDs and other nephrotoxins -PPD placed.  # chronic history of HIV noncompliant with meds -Check HIV RNA and CD4 count -pt has appt with Dr fitzgerald for sept 6th -Currently not on any medications for HIV, patient stopped taking  # Anemia of chronic kidney disease Monitor hemoglobin and transfuse as needed  # essential hypertension with noncompliance with meds Resume home meds amlodipine, atenolol, clonidine and hydralazine; uptitrate as needed  # bilateral lower extremity edema with tenderness Ruled out DVT with negatvie venous Dopplers Lasix as needed, encouraged patient to keep his legs elevated  CSW for HD chair time  Case discussed with Care Management/Social Worker. Management plans discussed with the patient, family and they are in agreement.  CODE STATUS: full  DVT Prophylaxis: Heparin  TOTAL TIME TAKING CARE OF THIS PATIENT: 30 minutes.  >50% time spent on counselling and coordination of care   D/C once HD chair time obtained.  Note: This dictation was prepared with Dragon dictation along with smaller phrase technology. Any transcriptional errors that result from this process are unintentional.  Bethene Hankinson M.D on 10/10/2016 at 7:33 AM  Between 7am to 6pm - Pager - [phone]  After 6pm go to [email or web address] - password [email or web address] [facility]  Office   [phone]  CC: Primary care physician; Roselee Nova, MD

## 2016-10-10 NOTE — Progress Notes (Signed)
Post hd assessment unchanged. Patient has no complaints currently.

## 2016-10-10 NOTE — Progress Notes (Signed)
TB skin assessment negative 0 induration

## 2016-10-11 LAB — PHOSPHORUS: Phosphorus: 3.3 mg/dL (ref 2.5–4.6)

## 2016-10-11 MED ORDER — PREDNISONE 20 MG PO TABS: 40.0000 mg | ORAL_TABLET | Freq: Every day | ORAL | 0 refills | Status: DC

## 2016-10-11 NOTE — Care Management (Signed)
Contacted [name] HD liaison for follow up on outpatient HD set up.  Awaiting return call.

## 2016-10-11 NOTE — Progress Notes (Signed)
HD completed without issue. All blood returned. Total UF 1.5L. Patient tolerated well.

## 2016-10-11 NOTE — Care Management (Signed)
Confirmed with Elvera Bicker that patient hs outpatient HD arranged at [facility] at 1115.  Confirmed with Dr Ola Spurr that patient has met with Almyra Free the HIV social worker and has been set up to be seen at [facility] clinic.  They will work with patient to get him approved for his HIV medications, and arranged his outpatient follow up appointments.  RNCM signing off.

## 2016-10-11 NOTE — Discharge Summary (Signed)
[facility] NAME: Louis [name]    MR#:  [id number]  DATE OF BIRTH:  October 19, 1960  DATE OF ADMISSION:  10/08/2016 ADMITTING PHYSICIAN: Nicholes Mango, MD  DATE OF DISCHARGE: 10/11/2016  PRIMARY CARE PHYSICIAN: Roselee Nova, MD    ADMISSION DIAGNOSIS:  Peripheral edema [R60.9] AKI (acute kidney injury) ([place]) [N17.9]  DISCHARGE DIAGNOSIS:  Active Problems:   AKI (acute kidney injury) ([place])  SECONDARY DIAGNOSIS:   Past Medical History:  Diagnosis Date  . Dermatophytosis of foot 06/23/2004  . Environmental allergies   . Gout   . HIV infection ([place]) 12/1995  . Hypertension   . Seborrhea 06/23/2004   HOSPITAL COURSE:   Louis Robinsonis a 56 y.o. malewith a known history of HIV chronic kidney disease, hypertension, gout is noncompliant with the medications including HIV meds and presenting to the emergency department with a chief complaint of leg swelling. Denies any chest pain or shortness of breath. 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 - he has arrangements for Out pt HD.  # chronic history of HIV noncompliant with meds -Checked HIV RNA and CD4 count -pt has appt with Dr fitzgerald for sept 6th -Currently not on any medications for HIV, patient stopped taking - Dr. Ola Spurr arranged for his clinic appt and HIV meds.  # Anemia of chronic kidney disease Monitor hemoglobin and transfuse as needed  # essential hypertension with noncompliance with meds Resume home meds amlodipine,  atenolol, clonidine and hydralazine; uptitrate as needed  # bilateral lower extremity edema with tenderness Ruled out DVT with negatvie venous Dopplers Lasix as needed, encouraged patient to keep his legs elevated   DISCHARGE CONDITIONS:   Stable.  CONSULTS OBTAINED:  Treatment Team:  Evaristo Bury, MD Murlean Iba, MD Leonel Ramsay, MD  DRUG ALLERGIES:   Allergies  Allergen Reactions  . Quinapril Hcl Swelling    Face swells    DISCHARGE MEDICATIONS:   Current Discharge Medication List    START taking these medications   Details  predniSONE (DELTASONE) 20 MG tablet Take 2 tablets (40 mg total) by mouth daily with breakfast. Qty: 3 tablet, Refills: 0      CONTINUE these medications which have NOT CHANGED   Details  amLODipine (NORVASC) 10 MG tablet Take 1 tablet (10 mg total) by mouth daily. Qty: 30 tablet, Refills: 0    atenolol (TENORMIN) 50 MG tablet Take 50 mg by mouth 2 (two) times daily.    cloNIDine (CATAPRES) 0.1 MG tablet Take 1 tablet (0.1 mg total) by mouth 2 (two) times daily. Qty: 60 tablet, Refills: 11    colchicine 0.6 MG tablet Take 1 tablet (0.6 mg total) by mouth daily as needed. Qty: 30 tablet, Refills: 0   Associated Diagnoses: Chronic idiopathic gout involving toe of right foot without tophus    desoximetasone (TOPICORT) 0.25 % cream Apply 1 application topically 2 (two) times daily. Qty: 30 g, Refills: 0    hydrOXYzine (ATARAX/VISTARIL) 50 MG tablet Take 1 tablet (50 mg total) by mouth 3 (three) times daily as needed. Qty:  30 tablet, Refills: 0    loratadine (CLARITIN) 10 MG tablet Take 10 mg by mouth daily as needed for allergies.    zolpidem (AMBIEN) 5 MG tablet Take 1 tablet (5 mg total) by mouth at bedtime. Qty: 20 tablet, Refills: 0      STOP taking these medications     hydrALAZINE (APRESOLINE) 100 MG tablet          DISCHARGE INSTRUCTIONS:    Follow with Nephrology clinic and ID clinic.  If you  experience worsening of your admission symptoms, develop shortness of breath, life threatening emergency, suicidal or homicidal thoughts you must seek medical attention immediately by calling 911 or calling your MD immediately  if symptoms less severe.  You Must read complete instructions/literature along with all the possible adverse reactions/side effects for all the Medicines you take and that have been prescribed to you. Take any new Medicines after you have completely understood and accept all the possible adverse reactions/side effects.   Please note  You were cared for by a hospitalist during your hospital stay. If you have any questions about your discharge medications or the care you received while you were in the hospital after you are discharged, you can call the unit and asked to speak with the hospitalist on call if the hospitalist that took care of you is not available. Once you are discharged, your primary care physician will handle any further medical issues. Please note that NO REFILLS for any discharge medications will be authorized once you are discharged, as it is imperative that you return to your primary care physician (or establish a relationship with a primary care physician if you do not have one) for your aftercare needs so that they can reassess your need for medications and monitor your lab values.    Today   CHIEF COMPLAINT:   Chief Complaint  Patient presents with  . Leg Swelling    HISTORY OF PRESENT ILLNESS:  Louis [name]  is a 56 y.o. male with a known history of HIV, chronic kidney disease, hypertension, gout is noncompliant with the medications including HIV meds and presenting to the emergency department with a chief complaint of leg swelling. Denies any chest pain or shortness of breath. Patient's creatinine is at 7.54 and BUN is at 94, baseline creatinine was 1.5 with GFR 60 in July 2017. Patient was seen by nephrology in the ED who is recommending  dialysis starting from tomorrow  VITAL SIGNS:  Blood pressure (!) 147/94, pulse 82, temperature 98.5 F (36.9 C), temperature source Oral, resp. rate 20, height 6\' 2"  (1.88 m), weight 89 kg (196 lb 3.4 oz), SpO2 100 %.  I/O:   Intake/Output Summary (Last 24 hours) at 10/11/16 1427 Last data filed at 10/11/16 1242  Gross per 24 hour  Intake              120 ml  Output             2500 ml  Net            -2380 ml    PHYSICAL EXAMINATION:  G GENERAL:  56 y.o.-year-old patient lying in the bed with no acute distress.  EYES: Pupils equal, round, reactive to light and accommodation. No scleral icterus. Extraocular muscles intact.  HEENT: Head atraumatic, normocephalic. Oropharynx and nasopharynx clear.  NECK:  Supple, no jugular venous distention. No thyroid enlargement, no tenderness.  LUNGS: Normal breath sounds bilaterally, no wheezing, rales, rhonchi. No use of accessory muscles  of respiration.right Upper chest perm cath placement.  CARDIOVASCULAR: S1, S2 normal. No murmurs, rubs, or gallops.  ABDOMEN: Soft, nontender, nondistended. Bowel sounds present. No organomegaly or mass.  EXTREMITIES: No cyanosis, clubbing or edema b/l.    NEUROLOGIC: Cranial nerves II through XII are intact. No focal Motor or sensory deficits b/l.   PSYCHIATRIC:  patient is alert and oriented x 3.  SKIN: No obvious rash, lesion, or ulcer.   DATA REVIEW:   CBC  Recent Labs Lab 10/09/16 0405  WBC 4.5  4.6  HGB 8.9*  8.8*  HCT 25.9*  26.8*  PLT 189  213    Chemistries   Recent Labs Lab 10/09/16 0405  NA 138  K 3.8  CL 110  CO2 20*  GLUCOSE 91  BUN 91*  CREATININE 7.23*  CALCIUM 8.5*  AST 24  ALT 10*  ALKPHOS 40  BILITOT 0.4    Cardiac Enzymes  Recent Labs Lab 10/08/16 0901  TROPONINI <0.03    Microbiology Results  Results for orders placed or performed during the hospital encounter of 10/08/16  Surgical PCR screen     Status: None   Collection Time: 10/08/16  2:45 PM   Result Value Ref Range Status   MRSA, PCR NEGATIVE NEGATIVE Final   Staphylococcus aureus NEGATIVE NEGATIVE Final    Comment:        The Xpert SA Assay (FDA approved for NASAL specimens in patients over 56 years of age), is one component of a comprehensive surveillance program.  Test performance has been validated by [facility] for patients greater than or equal to 76 year old. It is not intended to diagnose infection nor to guide or monitor treatment.     RADIOLOGY:  No results found.  EKG:   Orders placed or performed during the hospital encounter of 10/08/16  . EKG 12-Lead  . EKG 12-Lead      Management plans discussed with the patient, family and they are in agreement.  CODE STATUS:     Code Status Orders        Start     Ordered   10/08/16 1419  Full code  Continuous     10/08/16 1418    Code Status History    Date Active Date Inactive Code Status Order ID Comments User Context   08/22/2016  7:29 PM 08/27/2016  4:27 PM Full Code [id number]  Hillary Bow, MD ED      TOTAL TIME TAKING CARE OF THIS PATIENT: 35 minutes.    Vaughan Basta M.D on 10/11/2016 at 2:27 PM  Between 7am to 6pm - Pager - [phone]  After 6pm go to [email or web address] - password [email or web address] [facility]  Office  [phone]  CC: Primary care physician; Roselee Nova, MD   Note: This dictation was prepared with Dragon dictation along with smaller phrase technology. Any transcriptional errors that result from this process are unintentional.

## 2016-10-11 NOTE — Progress Notes (Signed)
HD initiated via R Chest HD catheter. Patient educated on catheter care and dressing changes in the outpatient setting.

## 2016-10-11 NOTE — Progress Notes (Signed)
[place], [place].   10/11/2016  Patient: Louis [name]   Date of Birth:  27-Aug-1960  Date of admission:  10/08/2016  Date of Discharge  10/11/2016    To Whom it May Concern:   Louis [name]  may return to work on 10/13/16.  PHYSICAL ACTIVITY:  Full  If you have any questions or concerns, please don't hesitate to call.  Sincerely,   Vaughan Basta M.D Pager [phone] Office : [phone]   .

## 2016-10-11 NOTE — Progress Notes (Signed)
   10/10/16 1439  PPD Results  Does patient have an induration at the injection site? No  Induration(mm) 0 mm

## 2016-10-11 NOTE — Progress Notes (Signed)
Discharge order received. Patient is alert and oriented. Vital signs stable . No signs of acute distress. Discharge instructions given. Patient verbalized understanding. No other issues noted at this time.   

## 2016-10-11 NOTE — Discharge Instructions (Signed)
Chronic Kidney Disease, Adult Chronic kidney disease (CKD) happens when the kidneys are damaged during a time of 3 or more months. The kidneys are two organs that do many important jobs in the body. These jobs include:  Removing wastes and extra fluids from the blood.  Making hormones that maintain the amount of fluid in your tissues and blood vessels.  Making sure that the body has the right amount of fluids and chemicals.  Most of the time, this condition does not go away, but it can usually be controlled. Steps must be taken to slow down the kidney damage or stop it from getting worse. Otherwise, the kidneys may stop working. Follow these instructions at home:  Follow your diet as told by your doctor. You may need to avoid alcohol, salty foods (sodium), and foods that are high in potassium, calcium, and protein.  Take over-the-counter and prescription medicines only as told by your doctor. Do not take any new medicines unless your doctor says you can do that. These include vitamins and minerals. ? Medicines and nutritional supplements can make kidney damage worse. ? Your doctor may need to change how much medicine you take.  Do not use any tobacco products. These include cigarettes, chewing tobacco, and e-cigarettes. If you need help quitting, ask your doctor.  Keep all follow-up visits as told by your doctor. This is important.  Check your blood pressure. Tell your doctor if there are changes to your blood pressure.  Get to a healthy weight. Stay at that weight. If you need help with this, ask your doctor.  Start or continue an exercise plan. Try to exercise at least 30 minutes a day, 5 days a week.  Stay up-to-date with your shots (immunizations) as told by your doctor. Contact a doctor if:  Your symptoms get worse.  You have new symptoms. Get help right away if:  You have symptoms of end-stage kidney disease. These include: ? Headaches. ? Skin that is darker or lighter  than normal. ? Numbness in your hands or feet. ? Easy bruising. ? Having hiccups often. ? Chest pain. ? Shortness of breath. ? Stopping of menstrual periods in women.  You have a fever.  You are making very little pee (urine).  You have pain or bleeding when you pee (urinate). This information is not intended to replace advice given to you by your health care provider. Make sure you discuss any questions you have with your health care provider. Document Released: 04/26/2009 Document Revised: 07/08/2015 Document Reviewed: 09/29/2011 Elsevier Interactive Patient Education  2017 [employer] Inc.  

## 2016-10-11 NOTE — Progress Notes (Signed)
Pre hd 

## 2016-10-11 NOTE — Progress Notes (Signed)
Pre HD assessment. Patient currently has no complaints.

## 2016-10-11 NOTE — Progress Notes (Signed)
Post hd assessment unchanged. Report called to primary RN

## 2016-10-11 NOTE — Progress Notes (Signed)
[place], [place] 10/11/16  Subjective:  Patient seen at bedside. States that he has improved since he has started dialysis. Next line he is due for third dialysis treatment today.  Objective:  Vital signs in last 24 hours:  Temp:  [98.1 F (36.7 C)-99.1 F (37.3 C)] 98.6 F (37 C) (08/29 0931) Pulse Rate:  [68-86] 75 (08/29 1000) Resp:  [12-22] 16 (08/29 1000) BP: (120-158)/(72-111) 120/87 (08/29 1000) SpO2:  [93 %-99 %] 94 % (08/29 1000) Weight:  [90.6 kg (199 lb 11.8 oz)-92.4 kg (203 lb 12.8 oz)] 90.6 kg (199 lb 11.8 oz) (08/29 0931)  Weight change: 0.363 kg (12.8 oz) Filed Weights   10/10/16 1320 10/10/16 1553 10/11/16 0931  Weight: 92.4 kg (203 lb 12.8 oz) 91.4 kg (201 lb 8 oz) 90.6 kg (199 lb 11.8 oz)    Intake/Output:    Intake/Output Summary (Last 24 hours) at 10/11/16 1021 Last data filed at 10/10/16 1902  Gross per 24 hour  Intake                0 ml  Output             1275 ml  Net            -1275 ml    Physical Exam: General: no acute distress, laying in the bed  HEENT Anicteric, moist oral mucous membranes  Neck: supple  Lungs: Normal breathing effort, clear to auscultation  Heart:: S1S2, regular  Abdomen: Soft, nontender, nondistended  Extremities: 1+ edema  Neurologic: Alert, oriented  Skin: no acute lesions      Basic Metabolic Panel:   Recent Labs Lab 10/08/16 0901 10/09/16 0405 10/09/16 1500  NA 139 138  --   K 3.8 3.8  --   CL 109 110  --   CO2 21* 20*  --   GLUCOSE 93 91  --   BUN 94* 91*  --   CREATININE 7.54* 7.23*  --   CALCIUM 8.4* 8.5*  --   PHOS  --   --  3.9     CBC:  Recent Labs Lab 10/08/16 0901 10/09/16 0405  WBC 4.3 4.5  4.6  NEUTROABS 2.2 2.6  HGB 8.9* 8.9*  8.8*  HCT 26.1* 25.9*  26.8*  MCV 99.4 99.8  100*  PLT 196 189  213      Lab Results  Component Value Date   HEPBSAG Negative 10/09/2016   HEPBSAB Non Reactive 10/09/2016   HEPBIGM Negative 08/23/2016       Microbiology:  Recent Results (from the past 240 hour(s))  Surgical PCR screen     Status: None   Collection Time: 10/08/16  2:45 PM  Result Value Ref Range Status   MRSA, PCR NEGATIVE NEGATIVE Final   Staphylococcus aureus NEGATIVE NEGATIVE Final    Comment:        The Xpert SA Assay (FDA approved for NASAL specimens in patients over 50 years of age), is one component of a comprehensive surveillance program.  Test performance has been validated by [facility] for patients greater than or equal to 67 year old. It is not intended to diagnose infection nor to guide or monitor treatment.     Coagulation Studies: No results for input(s): LABPROT, INR in the last 72 hours.  Urinalysis: No results for input(s): COLORURINE, LABSPEC, PHURINE, GLUCOSEU, HGBUR, BILIRUBINUR, KETONESUR, PROTEINUR, UROBILINOGEN, NITRITE, LEUKOCYTESUR in the last 72 hours.  Invalid input(s): APPERANCEUR    Imaging: No results  found.   Medications:   Current Meds  Medication Sig  . amLODipine (NORVASC) 10 MG tablet Take 1 tablet (10 mg total) by mouth daily.  Marland Kitchen atenolol (TENORMIN) 50 MG tablet Take 50 mg by mouth 2 (two) times daily.  . cloNIDine (CATAPRES) 0.1 MG tablet Take 1 tablet (0.1 mg total) by mouth 2 (two) times daily.  . colchicine 0.6 MG tablet Take 1 tablet (0.6 mg total) by mouth daily as needed.  . desoximetasone (TOPICORT) 0.25 % cream Apply 1 application topically 2 (two) times daily.  . hydrALAZINE (APRESOLINE) 100 MG tablet Take 1 tablet (100 mg total) by mouth 3 (three) times daily.  . hydrOXYzine (ATARAX/VISTARIL) 50 MG tablet Take 1 tablet (50 mg total) by mouth 3 (three) times daily as needed.  . loratadine (CLARITIN) 10 MG tablet Take 10 mg by mouth daily as needed for allergies.  Marland Kitchen zolpidem (AMBIEN) 5 MG tablet Take 1 tablet (5 mg total) by mouth at bedtime.      Assessment/ Plan:  56 y.o. male with poorly controlled hypertension, HIV no meds for 2 years,  gout,h/o NSAID use was admitted on 08/22/2016 .   1.  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 2.  Anemia of CKD, hgb 8.9 2.  Poorly controlled hypertension with Edema 4.  HIV.  No meds for 2 years 5.  Hyperuricemia, gout  Patient has progressively worsening kidney disease for the past year or so.  His baseline creatinine was 1.5/GFR 60 back in July of 2017.  Since then, his creatinine has been increasing.  His admission creatinine is 5.4. He has proteinuria of 2 g.  Patient has history of significant nonsteroidal use.  He also has HIV and has not been taking his medication for the past 2 years.  Differential diagnosis of worsening renal failure includes HIV nephropathy, FSGS, interstitial nephritis, NSAID-induced nephropathy, hypertension nephrosclerosis   Plan: Patient due for his third dialysis treatment today. He has tolerated the initiation of dialysis quite well. Outpatient dialysis placement almost completed.  We'll maintain the patient on Epogen 4000 units IV with dialysis.  Hypertension control has also improved therefore we will continue amlodipine, atenolol, clonidine, and hydralazine. Overall status significantly improved.   LOS: 3 Vianka Ertel 8/29/201810:21 [place], [place]

## 2016-10-14 ENCOUNTER — Other Ambulatory Visit: Payer: Self-pay | Admitting: Family Medicine

## 2016-10-14 DIAGNOSIS — I10 Essential (primary) hypertension: Secondary | ICD-10-CM

## 2016-10-31 ENCOUNTER — Telehealth: Payer: Self-pay | Admitting: Emergency Medicine

## 2016-10-31 NOTE — Telephone Encounter (Signed)
Valtrex is not on patient's active medication list, he should schedule an appointment for medication refill and disease follow-up.

## 2016-10-31 NOTE — Telephone Encounter (Signed)
Patient called need refill on valtrex

## 2016-11-02 NOTE — Telephone Encounter (Signed)
Patient has been on Valtrex 500 mg qd since 2008

## 2016-11-03 NOTE — Telephone Encounter (Signed)
Please schedule for an appointment for medication refill for Valtrex and for documentation purposes.

## 2016-11-06 NOTE — Telephone Encounter (Signed)
Patient states he is unable to make appointment at this time due to finances but he needs medication (Valtrex) pls call pt

## 2016-11-08 NOTE — Telephone Encounter (Signed)
Called patient to discuss the prescription for Valtrex. He is at the dialysis right now and will call us tomorrow with that information. Thank you

## 2016-11-09 NOTE — Telephone Encounter (Signed)
Pt returning your call [phone]

## 2016-11-10 ENCOUNTER — Telehealth: Payer: Self-pay

## 2016-11-10 NOTE — Telephone Encounter (Signed)
Pt states he is not coming back here and is going to call Dr Ola Spurr.

## 2016-11-10 NOTE — Telephone Encounter (Signed)
I tried to contact the patient to discuss  prescription for Valtrex and Dr. Ola Spurr has already filled the prescription according to the patient.

## 2016-11-10 NOTE — Telephone Encounter (Signed)
Patient was informed that since he has not been seen since March, he will have to come in for a visit. He was also told that Dr. Manuella Ghazi has never seen him for this illness nor written him a rx for this so he will need documentation in order to legally write this for him.  He was given the option to come in and be seen or to contact his ID doctor Ola Spurr).  Patient stated that he will give them a call b/c he has dialysis 3/week and should not have to come in for something that is already in his chart. "He will not be back."

## 2016-12-18 DIAGNOSIS — M1A00X0 Idiopathic chronic gout, unspecified site, without tophus (tophi): Secondary | ICD-10-CM | POA: Insufficient documentation

## 2016-12-19 ENCOUNTER — Encounter (INDEPENDENT_AMBULATORY_CARE_PROVIDER_SITE_OTHER): Payer: Self-pay

## 2016-12-19 ENCOUNTER — Ambulatory Visit (INDEPENDENT_AMBULATORY_CARE_PROVIDER_SITE_OTHER): Payer: PRIVATE HEALTH INSURANCE | Admitting: Vascular Surgery

## 2016-12-19 ENCOUNTER — Encounter (INDEPENDENT_AMBULATORY_CARE_PROVIDER_SITE_OTHER): Payer: Self-pay | Admitting: Vascular Surgery

## 2016-12-19 ENCOUNTER — Other Ambulatory Visit (INDEPENDENT_AMBULATORY_CARE_PROVIDER_SITE_OTHER): Payer: Self-pay | Admitting: Vascular Surgery

## 2016-12-19 VITALS — BP 109/71 | HR 66 | Resp 15 | Ht 74.0 in | Wt 204.0 lb

## 2016-12-19 DIAGNOSIS — I10 Essential (primary) hypertension: Secondary | ICD-10-CM

## 2016-12-19 DIAGNOSIS — N186 End stage renal disease: Secondary | ICD-10-CM

## 2016-12-19 DIAGNOSIS — Z992 Dependence on renal dialysis: Secondary | ICD-10-CM | POA: Diagnosis not present

## 2016-12-19 NOTE — Assessment & Plan Note (Signed)
Recommend:  The patient has advanced renal disease but currently does not have dialysis access that would be considered permanent.  His PermCath is working, but obviously this is not from solution.  The patient has elected to move forward with peritoneal dialysis. Although there is a history of prior operations, this does not appear at this time to be a prohibitive situation.  Most inguinal hernia repairs do not create extensive intra-abdominal scar tissue.  This was also discussed.  Risk and benefits were reviewed the patient.  Indications for the procedure were reviewed.  All questions were answered, the patient agrees to proceed with laparoscopic PD catheter insertion. Discussion included the treatment options for dialysis access and perioperative management.

## 2016-12-19 NOTE — Patient Instructions (Signed)
Peritoneal Dialysis Catheter Placement Peritoneal dialysis catheter placement is a surgery to insert a thin, flexible tube (catheter) in your abdomen. This surgery must be done before you begin peritoneal dialysis. Dialysis is a treatment that replaces some of the work that healthy kidneys do. During dialysis, wastes, salt, and extra water are removed from the blood. In peritoneal dialysis, these tasks are performed by transferring a fluid called dialysate to and from your abdomen during each session. The fluid goes through the catheter to enter the abdomen at the start of each dialysis session, and it drains out of the body through the catheter at the end of each session. The catheter will be small, soft, and easy to conceal. The surgery is usually done at least 2 weeks before you begin dialysis. Tell a health care provider about:  Any allergies you have.  All medicines you are taking, including vitamins, herbs, eye drops, creams, and over-the-counter medicines.  Use of steroids (by mouth or as creams).  Any problems you or family members have had with anesthetic medicines.  Any blood disorders you have.  Any surgeries you have had.  Smoking history.  Possibility of pregnancy, if this applies.  Any medical conditions you have. What are the risks? Generally, peritoneal dialysis catheter placement is a safe procedure. However, as with any procedure, complications can occur. Possible complications include:  Excessive bleeding.  Pain.  A collection of blood near the incision (hematoma).  Infection.  Slow healing.  Catheter problems after the surgery, such as the catheter becoming blocked, the catheter moving out of place, the catheter poking into or wrapping around intestines, or fluid leaking around the catheter.  Scarring.  Skin damage.  Damage to blood vessels, tissues, or organs (such as the intestines) in the abdomen area.  What happens before the procedure?  Ask your  health care provider about changing or stopping your regular medicines. You may need to stop taking certain medicines up to 2 weeks before the procedure.  Do not eat or drink anything for at least 8 hours before the procedure.  You may be asked to wash with an antibacterial soap the night before or the morning of the procedure.  Make plans to have someone drive you home after the procedure or after your hospital stay. Ask your health care provider whether you should expect to stay in the hospital overnight or go home the same day. What happens during the procedure? The surgeon may use either an open or laparoscopic technique for this surgery.  Open surgery. ? You will be given a medicine to make you sleep through the procedure (general anesthetic). ? Once you are asleep, the surgeon will make a small cut (incision) in your abdomen. ? The catheter will be put in place. ? The incision will be closed with stitches or staples.  Laparoscopic surgery. ? You will be given a medicine to numb the site selected for the incision (local anesthetic). ? When the area is numb, the surgeon will make a small incision in your abdomen. ? A thin, lighted tube with a tiny camera on the end (laparoscope) will be put through the incision. The camera sends pictures to a video screen in the operating room. This lets the surgeon see inside the abdomen during the procedure. ? The catheter will be put in place. ? The incision will be closed with stitches or staples.  What happens after the procedure?  You will be monitored closely in a recovery area. You may feel groggy   from the anesthetic.  You may have some pain. If so, you will be given pain medicine.  You may be able to go home the same day as the procedure, or you may need to stay in the hospital overnight.  You will be given instructions on how to care for the catheter and how it is used for the dialysis process.  Your body will heal around the catheter  to hold it in place. This information is not intended to replace advice given to you by your health care provider. Make sure you discuss any questions you have with your health care provider. Document Released: 01/18/2009 Document Revised: 07/08/2015 Document Reviewed: 07/22/2012 Elsevier Interactive Patient Education  2017 Elsevier Inc.  

## 2016-12-19 NOTE — Progress Notes (Signed)
Patient ID: Louis [name], male   DOB: 1960/03/13, 56 y.o.   MRN: [id number]  Chief Complaint  Patient presents with  . New Patient (Initial Visit)    Discuss PD cath placement    HPI Louis [name] is a 56 y.o. male.  I am asked to see the patient by Nephrology for evaluation of PD catheter placement.  The patient reports his PermCath that was placed about 2 months ago has been working fine for dialysis, but he desires to switch to peritoneal dialysis for his permanent access.  His only previous abdominal surgery were inguinal hernia repairs.  He denies fever or chills.  PermCath has been working well and remains in the right jugular location.   Past Medical History:  Diagnosis Date  . Dermatophytosis of foot 06/23/2004  . Environmental allergies   . Gout   . HIV infection ([place]) 12/1995  . Hypertension   . Seborrhea 06/23/2004    Past Surgical History:  Procedure Laterality Date  . HERNIA REPAIR      Family History  Problem Relation Age of Onset  . Bradycardia Maternal Grandmother        with pacemaker  NO bleeding disorders, clotting disorders, aneurysms, or autoimmune diseases  Social History Social History   Tobacco Use  . Smoking status: Never Smoker  . Smokeless tobacco: Never Used  Substance Use Topics  . Alcohol use: Yes    Alcohol/week: 1.2 oz    Types: 2 Standard drinks or equivalent per week    Comment: Just on the weekend  . Drug use: No  No IVDU  Allergies  Allergen Reactions  . Quinapril Hcl Swelling    Face swells    Current Outpatient Medications  Medication Sig Dispense Refill  . amLODipine (NORVASC) 10 MG tablet Take 1 tablet (10 mg total) by mouth daily. 30 tablet 0  . atenolol (TENORMIN) 50 MG tablet Take 50 mg by mouth 2 (two) times daily.    . cloNIDine (CATAPRES) 0.1 MG tablet Take 1 tablet (0.1 mg total) by mouth 2 (two) times daily. 60 tablet 11  . colchicine 0.6 MG tablet Take 1 tablet (0.6 mg total) by mouth daily as  needed. 30 tablet 0  . Dermatological Products, Misc. (LEVICYN) GEL Apply See admin instructions topically.  2  . desoximetasone (TOPICORT) 0.25 % cream Apply 1 application topically 2 (two) times daily. 30 g 0  . dolutegravir (TIVICAY) 50 MG tablet once daily    . emtricitabine-tenofovir AF (DESCOVY) 200-25 MG tablet once daily    . furosemide (LASIX) 40 MG tablet Take by mouth.    . hydrALAZINE (APRESOLINE) 100 MG tablet once daily    . hydrOXYzine (ATARAX/VISTARIL) 25 MG tablet     . hydrOXYzine (ATARAX/VISTARIL) 50 MG tablet Take 1 tablet (50 mg total) by mouth 3 (three) times daily as needed. 30 tablet 0  . ibuprofen (ADVIL,MOTRIN) 200 MG tablet Take by mouth.    . loratadine (CLARITIN) 10 MG tablet Take 10 mg by mouth daily as needed for allergies.    . predniSONE (DELTASONE) 20 MG tablet Take 2 tablets (40 mg total) by mouth daily with breakfast. 3 tablet 0  . sevelamer carbonate (RENVELA) 800 MG tablet 2 (two) times daily    . valACYclovir (VALTREX) 500 MG tablet     . zolpidem (AMBIEN) 5 MG tablet Take 1 tablet (5 mg total) by mouth at bedtime. 20 tablet 0   No current facility-administered medications for this visit.  REVIEW OF SYSTEMS (Negative unless checked)  Constitutional: '[]'$ Weight loss  '[]'$ Fever  '[]'$ Chills Cardiac: '[]'$ Chest pain   '[]'$ Chest pressure   '[]'$ Palpitations   '[]'$ Shortness of breath when laying flat   '[]'$ Shortness of breath at rest   '[x]'$ Shortness of breath with exertion. Vascular:  '[]'$ Pain in legs with walking   '[]'$ Pain in legs at rest   '[]'$ Pain in legs when laying flat   '[]'$ Claudication   '[]'$ Pain in feet when walking  '[]'$ Pain in feet at rest  '[]'$ Pain in feet when laying flat   '[]'$ History of DVT   '[]'$ Phlebitis   '[x]'$ Swelling in legs   '[]'$ Varicose veins   '[]'$ Non-healing ulcers Pulmonary:   '[]'$ Uses home oxygen   '[]'$ Productive cough   '[]'$ Hemoptysis   '[]'$ Wheeze  '[]'$ COPD   '[]'$ Asthma Neurologic:  '[]'$ Dizziness  '[]'$ Blackouts   '[]'$ Seizures   '[]'$ History of stroke   '[]'$ History of TIA  '[]'$ Aphasia    '[]'$ Temporary blindness   '[]'$ Dysphagia   '[]'$ Weakness or numbness in arms   '[]'$ Weakness or numbness in legs Musculoskeletal:  '[x]'$ Arthritis   '[]'$ Joint swelling   '[]'$ Joint pain   '[]'$ Low back pain Hematologic:  '[]'$ Easy bruising  '[]'$ Easy bleeding   '[]'$ Hypercoagulable state   '[]'$ Anemic  '[]'$ Hepatitis Gastrointestinal:  '[]'$ Blood in stool   '[]'$ Vomiting blood  '[]'$ Gastroesophageal reflux/heartburn   '[]'$ Abdominal pain Genitourinary:  '[x]'$ Chronic kidney disease   '[]'$ Difficult urination  '[]'$ Frequent urination  '[]'$ Burning with urination   '[]'$ Hematuria Skin:  '[]'$ Rashes   '[]'$ Ulcers   '[]'$ Wounds Psychological:  '[]'$ History of anxiety   '[]'$  History of major depression.    Physical Exam BP 109/71 (BP Location: Right Arm, Patient Position: Sitting)   Pulse 66   Resp 15   Ht '6\' 2"'$  (1.88 m)   Wt 204 lb (92.5 kg)   BMI 26.19 kg/m  Gen:  WD/WN, NAD Head: [place]/AT, No temporalis wasting.  Ear/Nose/Throat: Hearing grossly intact, nares w/o erythema or drainage, oropharynx w/o Erythema/Exudate Eyes: Conjunctiva clear, sclera non-icteric  Neck: trachea midline.  No JVD.  Pulmonary:  Good air movement, respirations not labored, no use of accessory muscles Cardiac: RRR, no JVD Vascular:  Vessel Right Left  Radial Palpable Palpable                                   Gastrointestinal: soft, non-tender/non-distended.  Musculoskeletal: M/S 5/5 throughout.  Extremities without ischemic changes.  No deformity or atrophy.  Trace lower extremity edema. Neurologic: Sensation grossly intact in extremities.  Symmetrical.  Speech is fluent. Motor exam as listed above. Psychiatric: Judgment intact, Mood & affect appropriate for pt's clinical situation. Dermatologic: No rashes or ulcers noted.  No cellulitis or open wounds.    Radiology No results found.  Labs Recent Results (from the past 2160 hour(s))  Comprehensive metabolic panel     Status: Abnormal   Collection Time: 10/08/16  9:01 AM  Result Value Ref Range   Sodium 139 135 - 145  mmol/L   Potassium 3.8 3.5 - 5.1 mmol/L   Chloride 109 101 - 111 mmol/L   CO2 21 (L) 22 - 32 mmol/L   Glucose, Bld 93 65 - 99 mg/dL   BUN 94 (H) 6 - 20 mg/dL   Creatinine, Ser 7.54 (H) 0.61 - 1.24 mg/dL   Calcium 8.4 (L) 8.9 - 10.3 mg/dL   Total Protein 7.9 6.5 - 8.1 g/dL   Albumin 2.7 (L) 3.5 - 5.0 g/dL   AST 28 15 - 41 U/L  ALT 11 (L) 17 - 63 U/L   Alkaline Phosphatase 54 38 - 126 U/L   Total Bilirubin 0.2 (L) 0.3 - 1.2 mg/dL   GFR calc non Af Amer 7 (L) >60 mL/min   GFR calc Af Amer 8 (L) >60 mL/min    Comment: (NOTE) The eGFR has been calculated using the CKD EPI equation. This calculation has not been validated in all clinical situations. eGFR's persistently <60 mL/min signify possible Chronic Kidney Disease.    Anion gap 9 5 - 15  Brain natriuretic peptide     Status: Abnormal   Collection Time: 10/08/16  9:01 AM  Result Value Ref Range   B Natriuretic Peptide 303.0 (H) 0.0 - 100.0 pg/mL  Troponin I     Status: None   Collection Time: 10/08/16  9:01 AM  Result Value Ref Range   Troponin I <0.03 <0.03 ng/mL  CBC with Differential     Status: Abnormal   Collection Time: 10/08/16  9:01 AM  Result Value Ref Range   WBC 4.3 3.8 - 10.6 K/uL   RBC 2.62 (L) 4.40 - 5.90 MIL/uL   Hemoglobin 8.9 (L) 13.0 - 18.0 g/dL   HCT 26.1 (L) 40.0 - 52.0 %   MCV 99.4 80.0 - 100.0 fL   MCH 33.9 26.0 - 34.0 pg   MCHC 34.1 32.0 - 36.0 g/dL   RDW 14.0 11.5 - 14.5 %   Platelets 196 150 - 440 K/uL   Neutrophils Relative % 51 %   Neutro Abs 2.2 1.4 - 6.5 K/uL   Lymphocytes Relative 34 %   Lymphs Abs 1.5 1.0 - 3.6 K/uL   Monocytes Relative 11 %   Monocytes Absolute 0.5 0.2 - 1.0 K/uL   Eosinophils Relative 4 %   Eosinophils Absolute 0.2 0 - 0.7 K/uL   Basophils Relative 0 %   Basophils Absolute 0.0 0 - 0.1 K/uL  Urinalysis, Complete w Microscopic     Status: Abnormal   Collection Time: 10/08/16 10:02 AM  Result Value Ref Range   Color, Urine YELLOW (A) YELLOW   APPearance CLEAR (A)  CLEAR   Specific Gravity, Urine 1.009 1.005 - 1.030   pH 5.0 5.0 - 8.0   Glucose, UA NEGATIVE NEGATIVE mg/dL   Hgb urine dipstick SMALL (A) NEGATIVE   Bilirubin Urine NEGATIVE NEGATIVE   Ketones, ur NEGATIVE NEGATIVE mg/dL   Protein, ur 100 (A) NEGATIVE mg/dL   Nitrite NEGATIVE NEGATIVE   Leukocytes, UA NEGATIVE NEGATIVE   RBC / HPF 0-5 0 - 5 RBC/hpf   WBC, UA 0-5 0 - 5 WBC/hpf   Bacteria, UA NONE SEEN NONE SEEN   Squamous Epithelial / LPF NONE SEEN NONE SEEN  Surgical PCR screen     Status: None   Collection Time: 10/08/16  2:45 PM  Result Value Ref Range   MRSA, PCR NEGATIVE NEGATIVE   Staphylococcus aureus NEGATIVE NEGATIVE    Comment:        The Xpert SA Assay (FDA approved for NASAL specimens in patients over 34 years of age), is one component of a comprehensive surveillance program.  Test performance has been validated by [facility] for patients greater than or equal to 42 year old. It is not intended to diagnose infection nor to guide or monitor treatment.   HIV 1 RNA quant-no reflex-bld     Status: None   Collection Time: 10/09/16  4:05 AM  Result Value Ref Range   HIV 1 RNA Quant 8,930 copies/mL  Comment: (NOTE) The reportable range for this assay is 20 to 10,000,000 copies HIV-1 RNA/mL.    LOG10 HIV-1 RNA 3.951 log10copy/mL    Comment: (NOTE) Performed At: [facility] [place], [place] [id number] Lindon Romp MD [id number]   Helper T-Lymph-CD4 [facility] only)     Status: Abnormal   Collection Time: 10/09/16  4:05 AM  Result Value Ref Range   Absolute CD 4 Helper 207 (L) 359 - 1,519 /uL   % CD 4 Pos. Lymph. 14.8 (L) 30.8 - 58.5 %   WBC 4.5 3.4 - 10.8 x10E3/uL   RBC 2.68 (L) 4.14 - 5.80 x10E6/uL   Hematocrit 26.8 (L) 37.5 - 51.0 %   MCV 100 (H) 79 - 97 fL   MCH 32.8 26.6 - 33.0 pg   MCHC 32.8 31.5 - 35.7 g/dL   RDW 14.4 12.3 - 15.4 %   Platelets 213 150 - 379 x10E3/uL   Neutrophils 57 Not Estab. %   Lymphs 32 Not Estab.  %   Monocytes 8 Not Estab. %   Eos 3 Not Estab. %   Basos 0 Not Estab. %   Neutrophils Absolute 2.6 1.4 - 7.0 x10E3/uL   Lymphocytes Absolute 1.4 0.7 - 3.1 x10E3/uL   Monocytes Absolute 0.4 0.1 - 0.9 x10E3/uL   EOS (ABSOLUTE) 0.1 0.0 - 0.4 x10E3/uL   Basophils Absolute 0.0 0.0 - 0.2 x10E3/uL   Immature Granulocytes 0 Not Estab. %   Immature Grans (Abs) 0.0 0.0 - 0.1 x10E3/uL    Comment: (NOTE) Performed At: [facility] [place], [place] [id number] Lindon Romp MD [id number]    Hemoglobin 8.8 (L) 13.0 - 17.7 g/dL  Comprehensive metabolic panel     Status: Abnormal   Collection Time: 10/09/16  4:05 AM  Result Value Ref Range   Sodium 138 135 - 145 mmol/L   Potassium 3.8 3.5 - 5.1 mmol/L   Chloride 110 101 - 111 mmol/L   CO2 20 (L) 22 - 32 mmol/L   Glucose, Bld 91 65 - 99 mg/dL   BUN 91 (H) 6 - 20 mg/dL   Creatinine, Ser 7.23 (H) 0.61 - 1.24 mg/dL   Calcium 8.5 (L) 8.9 - 10.3 mg/dL   Total Protein 7.7 6.5 - 8.1 g/dL   Albumin 2.6 (L) 3.5 - 5.0 g/dL   AST 24 15 - 41 U/L   ALT 10 (L) 17 - 63 U/L   Alkaline Phosphatase 40 38 - 126 U/L   Total Bilirubin 0.4 0.3 - 1.2 mg/dL   GFR calc non Af Amer 8 (L) >60 mL/min   GFR calc Af Amer 9 (L) >60 mL/min    Comment: (NOTE) The eGFR has been calculated using the CKD EPI equation. This calculation has not been validated in all clinical situations. eGFR's persistently <60 mL/min signify possible Chronic Kidney Disease.    Anion gap 8 5 - 15  CBC     Status: Abnormal   Collection Time: 10/09/16  4:05 AM  Result Value Ref Range   WBC 4.6 3.8 - 10.6 K/uL   RBC 2.60 (L) 4.40 - 5.90 MIL/uL   Hemoglobin 8.9 (L) 13.0 - 18.0 g/dL   HCT 25.9 (L) 40.0 - 52.0 %   MCV 99.8 80.0 - 100.0 fL   MCH 34.2 (H) 26.0 - 34.0 pg   MCHC 34.3 32.0 - 36.0 g/dL   RDW 13.9 11.5 - 14.5 %   Platelets 189 150 - 440 K/uL  Phosphorus  Status: None   Collection Time: 10/09/16  3:00 PM  Result Value Ref Range   Phosphorus 3.9  2.5 - 4.6 mg/dL  Hepatitis B surface antigen     Status: None   Collection Time: 10/09/16  3:00 PM  Result Value Ref Range   Hepatitis B Surface Ag Negative Negative    Comment: (NOTE) Performed At: [facility] [place], [place] [id number] Lindon Romp MD [id number]   Hepatitis B surface antibody     Status: None   Collection Time: 10/09/16  3:00 PM  Result Value Ref Range   Hep B S Ab Non Reactive     Comment: (NOTE)              Non Reactive: Inconsistent with immunity,                            less than 10 mIU/mL              Reactive:     Consistent with immunity,                            greater than 9.9 mIU/mL Performed At: [facility] [place], [place] [id number] Lindon Romp MD [id number]   Hepatitis B core antibody, total     Status: None   Collection Time: 10/09/16  3:00 PM  Result Value Ref Range   Hep B Core Total Ab Negative Negative    Comment: (NOTE) Performed At: [facility] [address] [place], [place] [id number] Lindon Romp MD [id number]   Uric acid     Status: Abnormal   Collection Time: 10/10/16  1:21 PM  Result Value Ref Range   Uric Acid, Serum 8.0 (H) 4.4 - 7.6 mg/dL  Phosphorus     Status: None   Collection Time: 10/11/16 12:35 PM  Result Value Ref Range   Phosphorus 3.3 2.5 - 4.6 mg/dL    Assessment/Plan:  Hypertension Likely an underlying cause of his renal failure. blood pressure control important in reducing the progression of atherosclerotic disease. On appropriate oral medications.   ESRD on dialysis [facility])  Recommend:  The patient has advanced renal disease but currently does not have dialysis access that would be considered permanent.  His PermCath is working, but obviously this is not from solution.  The patient has elected to move forward with peritoneal dialysis. Although there is a history of prior operations, this does not appear at this time to  be a prohibitive situation.  Most inguinal hernia repairs do not create extensive intra-abdominal scar tissue.  This was also discussed.  Risk and benefits were reviewed the patient.  Indications for the procedure were reviewed.  All questions were answered, the patient agrees to proceed with laparoscopic PD catheter insertion. Discussion included the treatment options for dialysis access and perioperative management.        Leotis Pain 12/19/2016, 10:31 AM   This note was created with Dragon medical transcription system.  Any errors from dictation are unintentional.

## 2016-12-19 NOTE — Assessment & Plan Note (Signed)
Likely an underlying cause of his renal failure. blood pressure control important in reducing the progression of atherosclerotic disease. On appropriate oral medications.

## 2016-12-21 ENCOUNTER — Encounter
Admission: RE | Admit: 2016-12-21 | Discharge: 2016-12-21 | Disposition: A | Discharge status: Home / Self Care | Payer: PRIVATE HEALTH INSURANCE | Source: Ambulatory Visit | Attending: Vascular Surgery | Admitting: Vascular Surgery

## 2016-12-21 ENCOUNTER — Other Ambulatory Visit: Payer: Self-pay

## 2016-12-21 DIAGNOSIS — Z01810 Encounter for preprocedural cardiovascular examination: Secondary | ICD-10-CM | POA: Diagnosis present

## 2016-12-21 DIAGNOSIS — Z01812 Encounter for preprocedural laboratory examination: Secondary | ICD-10-CM | POA: Diagnosis not present

## 2016-12-21 HISTORY — DX: Chronic kidney disease, unspecified: N18.9

## 2016-12-21 LAB — BASIC METABOLIC PANEL WITH GFR
Anion gap: 16 — ABNORMAL HIGH (ref 5–15)
BUN: 42 mg/dL — ABNORMAL HIGH (ref 6–20)
CO2: 26 mmol/L (ref 22–32)
Calcium: 9.2 mg/dL (ref 8.9–10.3)
Chloride: 99 mmol/L — ABNORMAL LOW (ref 101–111)
Creatinine, Ser: 8.08 mg/dL — ABNORMAL HIGH (ref 0.61–1.24)
GFR calc Af Amer: 8 mL/min — ABNORMAL LOW
GFR calc non Af Amer: 7 mL/min — ABNORMAL LOW
Glucose, Bld: 79 mg/dL (ref 65–99)
Potassium: 3.6 mmol/L (ref 3.5–5.1)
Sodium: 141 mmol/L (ref 135–145)

## 2016-12-21 LAB — CBC WITH DIFFERENTIAL/PLATELET
Basophils Absolute: 0 10*3/uL (ref 0–0.1)
Basophils Relative: 1 %
Eosinophils Absolute: 0.2 10*3/uL (ref 0–0.7)
Eosinophils Relative: 4 %
HCT: 40.5 % (ref 40.0–52.0)
Hemoglobin: 12.9 g/dL — ABNORMAL LOW (ref 13.0–18.0)
Lymphocytes Relative: 30 %
Lymphs Abs: 1.9 10*3/uL (ref 1.0–3.6)
MCH: 30.9 pg (ref 26.0–34.0)
MCHC: 31.8 g/dL — ABNORMAL LOW (ref 32.0–36.0)
MCV: 97.2 fL (ref 80.0–100.0)
Monocytes Absolute: 1 10*3/uL (ref 0.2–1.0)
Monocytes Relative: 15 %
Neutro Abs: 3.3 10*3/uL (ref 1.4–6.5)
Neutrophils Relative %: 50 %
Platelets: 276 10*3/uL (ref 150–440)
RBC: 4.16 MIL/uL — ABNORMAL LOW (ref 4.40–5.90)
RDW: 18.4 % — ABNORMAL HIGH (ref 11.5–14.5)
WBC: 6.5 10*3/uL (ref 3.8–10.6)

## 2016-12-21 LAB — SURGICAL PCR SCREEN
MRSA, PCR: NEGATIVE
Staphylococcus aureus: NEGATIVE

## 2016-12-21 LAB — APTT: aPTT: 34 seconds (ref 24–36)

## 2016-12-21 LAB — TYPE AND SCREEN
ABO/RH(D): O POS
Antibody Screen: NEGATIVE

## 2016-12-21 LAB — PROTIME-INR
INR: 1.07
Prothrombin Time: 13.8 s (ref 11.4–15.2)

## 2016-12-21 NOTE — Patient Instructions (Signed)
Your procedure is scheduled on: Thursday 12/28/16 Report to [place]. 2ND FLOOR MEDICAL MALL ENTRANCE. To find out your arrival time please call [phone] between 1PM - 3PM on Wednesday 12/27/16.  Remember: Instructions that are not followed completely may result in serious medical risk, up to and including death, or upon the discretion of your surgeon and anesthesiologist your surgery may need to be rescheduled.    __X__ 1. Do not eat anything after midnight the night before your    procedure.  No gum chewing or hard candies.  You may drink clear   liquids up to 2 hours before you are scheduled to arrive at the   hospital for your procedure. Do not drink clear liquids within 2   hours of scheduled arrival to the hospital as this may lead to your   procedure being delayed or rescheduled.       Clear liquids include:   Water or Apple juice without pulp   Clear carbohydrate beverage such as Clearfast or Gatorade   Black coffee or Clear Tea (no milk, no creamer, do not add anything   to the coffee or tea)    Diabetics should only drink water   __X__ 2. No Alcohol for 24 hours before or after surgery.   ____ 3. Bring all medications with you on the day of surgery if instructed.    __X__ 4. Notify your doctor if there is any change in your medical condition     (cold, fever, infections).             __X___5. No smoking within 24 hours of your surgery.     Do not wear jewelry, make-up, hairpins, clips or nail polish.  Do not wear lotions, powders, or perfumes.   Do not shave 48 hours prior to surgery. Men may shave face and neck.  Do not bring valuables to the hospital.    [facility] is not responsible for any belongings or valuables.               Contacts, dentures or bridgework may not be worn into surgery.  Leave your suitcase in the car. After surgery it may be brought to your room.  For patients admitted to the hospital, discharge time is determined by your                 treatment team.   Patients discharged the day of surgery will not be allowed to drive home.   Please read over the following fact sheets that you were given:   MRSA Information   __X__ Take these medicines the morning of surgery with A SIP OF WATER:    1. AMLODIPINE  2. ATENOLOL  3.  CLONIDINE  4. DOLUTEGRAVIR  5. EMTRICTABINE-TENOFOVIR  6. HYDRALAZINE  ____ Fleet Enema (as directed)   __X__ Use CHG Soap/SAGE wipes as directed  ____ Use inhalers on the day of surgery  ____ Stop metformin 2 days prior to surgery    ____ Take 1/2 of usual insulin dose the night before surgery and none on the morning of surgery.   ____ Stop Coumadin/Plavix/aspirin on   __X__ Stop Anti-inflammatories such as Advil, Aleve, Ibuprofen, Motrin, Naproxen, Naprosyn, Goodies,powder, or aspirin products.  OK to take Tylenol.   __X__ Stop supplements, Vitamin E, Fish Oil until after surgery.    ____ Bring C-Pap to the hospital.

## 2016-12-22 NOTE — Pre-Procedure Instructions (Signed)
Lab results faxed to Dr. Bunnie Domino office

## 2016-12-27 MED ORDER — CEFAZOLIN SODIUM-DEXTROSE 2-4 GM/100ML-% IV SOLN
2.0000 g | INTRAVENOUS | Status: AC
  Administered 2016-12-28: 2 g via INTRAVENOUS

## 2016-12-28 ENCOUNTER — Encounter
Admission: RE | Disposition: A | Discharge status: Home / Self Care | Payer: Self-pay | Source: Ambulatory Visit | Attending: Vascular Surgery

## 2016-12-28 ENCOUNTER — Ambulatory Visit: Payer: PRIVATE HEALTH INSURANCE | Admitting: Registered Nurse

## 2016-12-28 ENCOUNTER — Encounter: Payer: Self-pay | Admitting: *Deleted

## 2016-12-28 ENCOUNTER — Ambulatory Visit
Admission: RE | Admit: 2016-12-28 | Discharge: 2016-12-28 | Disposition: A | Discharge status: Home / Self Care | Payer: PRIVATE HEALTH INSURANCE | Source: Ambulatory Visit | Attending: Vascular Surgery | Admitting: Vascular Surgery

## 2016-12-28 DIAGNOSIS — Z21 Asymptomatic human immunodeficiency virus [HIV] infection status: Secondary | ICD-10-CM | POA: Diagnosis not present

## 2016-12-28 DIAGNOSIS — N186 End stage renal disease: Secondary | ICD-10-CM | POA: Diagnosis not present

## 2016-12-28 DIAGNOSIS — Z79899 Other long term (current) drug therapy: Secondary | ICD-10-CM | POA: Insufficient documentation

## 2016-12-28 DIAGNOSIS — Z888 Allergy status to other drugs, medicaments and biological substances status: Secondary | ICD-10-CM | POA: Insufficient documentation

## 2016-12-28 DIAGNOSIS — M109 Gout, unspecified: Secondary | ICD-10-CM | POA: Diagnosis not present

## 2016-12-28 DIAGNOSIS — Z992 Dependence on renal dialysis: Secondary | ICD-10-CM | POA: Diagnosis not present

## 2016-12-28 DIAGNOSIS — I120 Hypertensive chronic kidney disease with stage 5 chronic kidney disease or end stage renal disease: Secondary | ICD-10-CM | POA: Insufficient documentation

## 2016-12-28 HISTORY — PX: CAPD INSERTION: SHX5233

## 2016-12-28 LAB — POCT I-STAT 4, (NA,K, GLUC, HGB,HCT)
Glucose, Bld: 95 mg/dL (ref 65–99)
HCT: 41 % (ref 39.0–52.0)
Hemoglobin: 13.9 g/dL (ref 13.0–17.0)
Potassium: 3.7 mmol/L (ref 3.5–5.1)
Sodium: 143 mmol/L (ref 135–145)

## 2016-12-28 SURGERY — LAPAROSCOPIC INSERTION CONTINUOUS AMBULATORY PERITONEAL DIALYSIS  (CAPD) CATHETER
Anesthesia: General

## 2016-12-28 MED ORDER — CHLORHEXIDINE GLUCONATE CLOTH 2 % EX PADS: 6.0000 | MEDICATED_PAD | Freq: Once | CUTANEOUS | Status: DC

## 2016-12-28 MED ORDER — SODIUM CHLORIDE FLUSH 0.9 % IV SOLN
INTRAVENOUS | Status: AC
  Filled 2016-12-28: qty 10

## 2016-12-28 MED ORDER — FENTANYL CITRATE (PF) 100 MCG/2ML IJ SOLN
INTRAMUSCULAR | Status: AC
  Filled 2016-12-28: qty 2

## 2016-12-28 MED ORDER — MEPERIDINE HCL 50 MG/ML IJ SOLN: 6.2500 mg | INTRAMUSCULAR | Status: DC | PRN

## 2016-12-28 MED ORDER — ROCURONIUM BROMIDE 100 MG/10ML IV SOLN
INTRAVENOUS | Status: DC | PRN
  Administered 2016-12-28: 5 mg via INTRAVENOUS
  Administered 2016-12-28: 20 mg via INTRAVENOUS

## 2016-12-28 MED ORDER — ROCURONIUM BROMIDE 50 MG/5ML IV SOLN
INTRAVENOUS | Status: AC
  Filled 2016-12-28: qty 1

## 2016-12-28 MED ORDER — NEOSTIGMINE METHYLSULFATE 10 MG/10ML IV SOLN
INTRAVENOUS | Status: AC
  Filled 2016-12-28: qty 1

## 2016-12-28 MED ORDER — MIDAZOLAM HCL 2 MG/2ML IJ SOLN
INTRAMUSCULAR | Status: DC | PRN
  Administered 2016-12-28: 2 mg via INTRAVENOUS

## 2016-12-28 MED ORDER — FENTANYL CITRATE (PF) 100 MCG/2ML IJ SOLN
INTRAMUSCULAR | Status: AC
  Administered 2016-12-28: 25 ug via INTRAVENOUS
  Filled 2016-12-28: qty 2

## 2016-12-28 MED ORDER — DEXAMETHASONE SODIUM PHOSPHATE 10 MG/ML IJ SOLN
INTRAMUSCULAR | Status: AC
  Filled 2016-12-28: qty 1

## 2016-12-28 MED ORDER — MIDAZOLAM HCL 2 MG/2ML IJ SOLN
INTRAMUSCULAR | Status: AC
  Filled 2016-12-28: qty 2

## 2016-12-28 MED ORDER — ONDANSETRON HCL 4 MG/2ML IJ SOLN
INTRAMUSCULAR | Status: DC | PRN
  Administered 2016-12-28: 4 mg via INTRAVENOUS

## 2016-12-28 MED ORDER — SODIUM CHLORIDE 0.9 % IV SOLN
INTRAVENOUS | Status: DC
  Administered 2016-12-28: 11:00:00 via INTRAVENOUS

## 2016-12-28 MED ORDER — FAMOTIDINE 20 MG PO TABS
20.0000 mg | ORAL_TABLET | Freq: Once | ORAL | Status: AC
  Administered 2016-12-28: 20 mg via ORAL

## 2016-12-28 MED ORDER — HYDROCODONE-ACETAMINOPHEN 5-325 MG PO TABS: 1.0000 | ORAL_TABLET | Freq: Four times a day (QID) | ORAL | 0 refills | Status: DC | PRN

## 2016-12-28 MED ORDER — FENTANYL CITRATE (PF) 100 MCG/2ML IJ SOLN
INTRAMUSCULAR | Status: DC | PRN
  Administered 2016-12-28: 100 ug via INTRAVENOUS

## 2016-12-28 MED ORDER — ATENOLOL 50 MG PO TABS
ORAL_TABLET | ORAL | Status: AC
  Filled 2016-12-28: qty 2

## 2016-12-28 MED ORDER — ATENOLOL 50 MG PO TABS
100.0000 mg | ORAL_TABLET | Freq: Once | ORAL | Status: AC
  Administered 2016-12-28: 100 mg via ORAL

## 2016-12-28 MED ORDER — LIDOCAINE HCL (CARDIAC) 20 MG/ML IV SOLN
INTRAVENOUS | Status: DC | PRN
  Administered 2016-12-28: 60 mg via INTRAVENOUS

## 2016-12-28 MED ORDER — OXYCODONE HCL 5 MG PO TABS: 5.0000 mg | ORAL_TABLET | Freq: Once | ORAL | Status: DC | PRN

## 2016-12-28 MED ORDER — GLYCOPYRROLATE 0.2 MG/ML IJ SOLN
INTRAMUSCULAR | Status: DC | PRN
  Administered 2016-12-28: 0.6 mg via INTRAVENOUS

## 2016-12-28 MED ORDER — GLYCOPYRROLATE 0.2 MG/ML IJ SOLN
INTRAMUSCULAR | Status: AC
  Filled 2016-12-28: qty 2

## 2016-12-28 MED ORDER — PROPOFOL 10 MG/ML IV BOLUS
INTRAVENOUS | Status: DC | PRN
Start: 2016-12-28 — End: 2016-12-28
  Administered 2016-12-28: 200 mg via INTRAVENOUS

## 2016-12-28 MED ORDER — DEXAMETHASONE SODIUM PHOSPHATE 10 MG/ML IJ SOLN
INTRAMUSCULAR | Status: DC | PRN
  Administered 2016-12-28: 10 mg via INTRAVENOUS

## 2016-12-28 MED ORDER — GLYCOPYRROLATE 0.2 MG/ML IJ SOLN
INTRAMUSCULAR | Status: AC
  Filled 2016-12-28: qty 1

## 2016-12-28 MED ORDER — FENTANYL CITRATE (PF) 100 MCG/2ML IJ SOLN
25.0000 ug | INTRAMUSCULAR | Status: DC | PRN
  Administered 2016-12-28 (×2): 25 ug via INTRAVENOUS

## 2016-12-28 MED ORDER — PROPOFOL 10 MG/ML IV BOLUS
INTRAVENOUS | Status: AC
  Filled 2016-12-28: qty 20

## 2016-12-28 MED ORDER — NEOSTIGMINE METHYLSULFATE 10 MG/10ML IV SOLN
INTRAVENOUS | Status: DC | PRN
  Administered 2016-12-28: 5 mg via INTRAVENOUS

## 2016-12-28 MED ORDER — SUCCINYLCHOLINE CHLORIDE 20 MG/ML IJ SOLN
INTRAMUSCULAR | Status: DC | PRN
  Administered 2016-12-28: 100 mg via INTRAVENOUS

## 2016-12-28 MED ORDER — OXYCODONE HCL 5 MG/5ML PO SOLN: 5.0000 mg | Freq: Once | ORAL | Status: DC | PRN

## 2016-12-28 MED ORDER — CEFAZOLIN SODIUM-DEXTROSE 2-4 GM/100ML-% IV SOLN
INTRAVENOUS | Status: AC
Start: 2016-12-28 — End: 2016-12-28
  Filled 2016-12-28: qty 100

## 2016-12-28 MED ORDER — LIDOCAINE HCL (PF) 2 % IJ SOLN
INTRAMUSCULAR | Status: AC
  Filled 2016-12-28: qty 10

## 2016-12-28 MED ORDER — PROMETHAZINE HCL 25 MG/ML IJ SOLN: 6.2500 mg | INTRAMUSCULAR | Status: DC | PRN

## 2016-12-28 MED ORDER — ONDANSETRON HCL 4 MG/2ML IJ SOLN
INTRAMUSCULAR | Status: AC
  Filled 2016-12-28: qty 2

## 2016-12-28 SURGICAL SUPPLY — 36 items
ADAPTER BETA CAP QUINTON DIALY (ADAPTER) IMPLANT
ADAPTER CATH DIALYSIS 18.75 (CATHETERS) IMPLANT
ADAPTER CATH DIALYSIS 4X8 IT L (MISCELLANEOUS) ×2 IMPLANT
ADH SKN CLS APL DERMABOND .7 (GAUZE/BANDAGES/DRESSINGS) ×1
ADPR DLYS BCP STRL PRTNL ULTEM (ADAPTER)
CANISTER SUCT 1200ML W/VALVE (MISCELLANEOUS) ×2 IMPLANT
CATH DLYS SWAN NECK 62.5CM (CATHETERS) ×2 IMPLANT
CHLORAPREP W/TINT 26ML (MISCELLANEOUS) ×2 IMPLANT
DERMABOND ADVANCED (GAUZE/BANDAGES/DRESSINGS) ×1
DERMABOND ADVANCED .7 DNX12 (GAUZE/BANDAGES/DRESSINGS) ×1 IMPLANT
ELECT CAUTERY BLADE 6.4 (BLADE) ×2 IMPLANT
ELECT REM PT RETURN 9FT ADLT (ELECTROSURGICAL) ×2
ELECTRODE REM PT RTRN 9FT ADLT (ELECTROSURGICAL) ×1 IMPLANT
GLOVE BIO SURGEON STRL SZ7 (GLOVE) ×4 IMPLANT
GLOVE INDICATOR 7.5 STRL GRN (GLOVE) ×2 IMPLANT
GOWN STRL REUS W/ TWL LRG LVL3 (GOWN DISPOSABLE) ×2 IMPLANT
GOWN STRL REUS W/ TWL XL LVL3 (GOWN DISPOSABLE) ×1 IMPLANT
GOWN STRL REUS W/TWL LRG LVL3 (GOWN DISPOSABLE) ×4
GOWN STRL REUS W/TWL XL LVL3 (GOWN DISPOSABLE) ×2
IV NS 500ML (IV SOLUTION) ×2
IV NS 500ML BAXH (IV SOLUTION) ×1 IMPLANT
KIT RM TURNOVER STRD PROC AR (KITS) ×2 IMPLANT
LABEL OR SOLS (LABEL) IMPLANT
MINICAP W/POVIDONE IODINE SOL (MISCELLANEOUS) ×1 IMPLANT
PACK LAP CHOLECYSTECTOMY (MISCELLANEOUS) ×2 IMPLANT
PENCIL ELECTRO HAND CTR (MISCELLANEOUS) ×2 IMPLANT
SET CYSTO W/LG BORE CLAMP LF (SET/KITS/TRAYS/PACK) ×2 IMPLANT
SET TRANSFER 6 W/TWIST CLAMP 5 (SET/KITS/TRAYS/PACK) ×2 IMPLANT
SPONGE DRAIN TRACH 4X4 STRL 2S (GAUZE/BANDAGES/DRESSINGS) ×2 IMPLANT
SPONGE VERSALON 4X4 4PLY (MISCELLANEOUS) IMPLANT
SUT MNCRL AB 4-0 PS2 18 (SUTURE) ×2 IMPLANT
SUT VIC AB 2-0 UR6 27 (SUTURE) ×2 IMPLANT
SUT VICRYL+ 3-0 36IN CT-1 (SUTURE) ×2 IMPLANT
TROCAR XCEL NON-BLD 11X100MML (ENDOMECHANICALS) ×2 IMPLANT
TROCAR XCEL NON-BLD 5MMX100MML (ENDOMECHANICALS) ×2 IMPLANT
TUBING INSUFFLATOR HI FLOW (MISCELLANEOUS) ×2 IMPLANT

## 2016-12-28 NOTE — Anesthesia Post-op Follow-up Note (Signed)
Anesthesia QCDR form completed.        

## 2016-12-28 NOTE — H&P (Signed)
[facility] History & Physical Update  The patient was interviewed and re-examined.  The patient's previous History and Physical has been reviewed and is unchanged.  There is no change in the plan of care. We plan to proceed with the scheduled procedure.  Leotis Pain, MD  12/28/2016, 12:24 PM

## 2016-12-28 NOTE — Discharge Instructions (Signed)

## 2016-12-28 NOTE — Anesthesia Preprocedure Evaluation (Signed)
Anesthesia Evaluation  Patient identified by MRN, date of birth, ID band Patient awake    Reviewed: Allergy & Precautions, NPO status , Patient's Chart, lab work & pertinent test results  History of Anesthesia Complications Negative for: history of anesthetic complications  Airway Mallampati: II  TM Distance: >3 FB Neck ROM: Full    Dental no notable dental hx.    Pulmonary neg pulmonary ROS, neg sleep apnea, neg COPD,    breath sounds clear to auscultation- rhonchi (-) wheezing      Cardiovascular Exercise Tolerance: Good hypertension, Pt. on medications (-) CAD, (-) Past MI and (-) Cardiac Stents  Rhythm:Regular Rate:Normal - Systolic murmurs and - Diastolic murmurs    Neuro/Psych negative neurological ROS  negative psych ROS   GI/Hepatic negative GI ROS, Neg liver ROS,   Endo/Other  negative endocrine ROSneg diabetes  Renal/GU ESRF and DialysisRenal disease     Musculoskeletal negative musculoskeletal ROS (+)   Abdominal (+) - obese,   Peds  Hematology negative hematology ROS (+)   Anesthesia Other Findings Past Medical History: No date: Chronic kidney disease     Comment:  esrd 06/23/2004: Dermatophytosis of foot No date: Environmental allergies No date: Gout 12/1995: HIV infection ([place]) No date: Hypertension 06/23/2004: Seborrhea   Reproductive/Obstetrics                             Anesthesia Physical Anesthesia Plan  ASA: IV  Anesthesia Plan: General   Post-op Pain Management:    Induction: Intravenous  PONV Risk Score and Plan: 1 and Dexamethasone and Ondansetron  Airway Management Planned: Oral ETT  Additional Equipment:   Intra-op Plan:   Post-operative Plan: Extubation in OR  Informed Consent: I have reviewed the patients History and Physical, chart, labs and discussed the procedure including the risks, benefits and alternatives for the proposed anesthesia  with the patient or authorized representative who has indicated his/her understanding and acceptance.   Dental advisory given  Plan Discussed with: CRNA and Anesthesiologist  Anesthesia Plan Comments:         Anesthesia Quick Evaluation

## 2016-12-28 NOTE — Anesthesia Procedure Notes (Signed)
Procedure Name: Intubation Date/Time: 12/28/2016 1:07 PM Performed by: Dionne Bucy, CRNA Pre-anesthesia Checklist: Patient identified, Patient being monitored, Timeout performed, Emergency Drugs available and Suction available Patient Re-evaluated:Patient Re-evaluated prior to induction Oxygen Delivery Method: Circle system utilized Preoxygenation: Pre-oxygenation with 100% oxygen Induction Type: IV induction Ventilation: Mask ventilation without difficulty Laryngoscope Size: Mac and 4 Grade View: Grade I Tube type: Oral Tube size: 7.0 mm Number of attempts: 1 Airway Equipment and Method: Stylet Placement Confirmation: ETT inserted through vocal cords under direct vision,  positive ETCO2 and breath sounds checked- equal and bilateral Secured at: 24 cm Tube secured with: Tape Dental Injury: Teeth and Oropharynx as per pre-operative assessment

## 2016-12-28 NOTE — Anesthesia Postprocedure Evaluation (Signed)
Anesthesia Post Note  Patient: Louis [name]  Procedure(s) Performed: LAPAROSCOPIC INSERTION CONTINUOUS AMBULATORY PERITONEAL DIALYSIS  (CAPD) CATHETER (N/A )  Patient location during evaluation: PACU Anesthesia Type: General Level of consciousness: awake and alert and oriented Pain management: pain level controlled Vital Signs Assessment: post-procedure vital signs reviewed and stable Respiratory status: spontaneous breathing, nonlabored ventilation and respiratory function stable Cardiovascular status: blood pressure returned to baseline and stable Postop Assessment: no signs of nausea or vomiting Anesthetic complications: no     Last Vitals:  Vitals:   12/28/16 1428 12/28/16 1447  BP: (!) 154/105 (!) 167/94  Pulse: (!) 52 (!) 49  Resp: 12 16  Temp:  36.9 C  SpO2: 97%     Last Pain:  Vitals:   12/28/16 1447  TempSrc:   PainSc: 0-No pain                 Kaneisha Ellenberger

## 2016-12-28 NOTE — Op Note (Signed)
  OPERATIVE NOTE   PROCEDURE: 1. Laparoscopic peritoneal dialysis catheter placement.  PRE-OPERATIVE DIAGNOSIS: 1. ESRD   POST-OPERATIVE DIAGNOSIS: Same  SURGEON: Leotis Pain, MD  ASSISTANT(S): None  ANESTHESIA: general  ESTIMATED BLOOD LOSS: Minimal   FINDING(S): 1. None  SPECIMEN(S): None  INDICATIONS:  Patient presents with ESRD. The patient has decided to do peritoneal dialysis for his long-term dialysis. Risks and benefits of placement were discussed and he is agreeable to proceed.  Differences between peritoneal dialysis and hemodialysis were discussed.    DESCRIPTION: After obtaining full informed written consent, the patient was brought back to the operating room and placed supine upon the operating table. The patient received IV antibiotics prior to induction. After obtaining adequate anesthesia, the abdomen was prepped and draped in the standard fashion. A small transverse incision was created just to the left of the umbilicus and we dissected down to the fascia and placed a pursestring Vicryl suture. I then entered the peritoneum with an 82mm Optiview trocar placed in the right upper quadrant and insufflated the abdomen with carbon dioxide. I then entered the peritoneum just beside the umbilicus with a trocar and the peritoneal dialysis catheter under direct visualization. The coiled portion of the catheter was parked into the pelvis under direct laparoscopic guidance. The deep cuff was secured to the fascial pursestring suture. A small counterincision was made in the left abdomen and the catheter was brought out this site. The appropriate distal connectors were placed, and I then placed 500 cc of saline through the catheter into the pelvis. The abdomen was desufflated. Immediately, about 400 cc of effluent returned through the catheter when the bag was placed to gravity. I took one more look with the camera to ensure that the catheter was in the pelvis and it was. The 20mm  trocar was then removed. I then closed the incisions with 3-0 Vicryl and 4-0 Monocryl and placed Dermabond as dressing. Dry dressing was placed around the catheter exit site. The patient was then awakened from anesthesia and taken to the recovery room in stable condition having tolerated the procedure well.  COMPLICATIONS: None  CONDITION: None  Leotis Pain, MD 12/28/2016 1:53 PM   This note was created with Dragon Medical transcription system. Any errors in dictation are purely unintentional.

## 2016-12-28 NOTE — Transfer of Care (Signed)
Immediate Anesthesia Transfer of Care Note  Patient: Louis [name]  Procedure(s) Performed: Procedure(s): LAPAROSCOPIC INSERTION CONTINUOUS AMBULATORY PERITONEAL DIALYSIS  (CAPD) CATHETER (N/A)  Patient Location: PACU  Anesthesia Type:General  Level of Consciousness: sedated  Airway & Oxygen Therapy: Patient Spontanous Breathing and Patient connected to face mask oxygen  Post-op Assessment: Report given to RN and Post -op Vital signs reviewed and stable  Post vital signs: Reviewed and stable  Last Vitals:  Vitals:   12/28/16 1357 12/28/16 1400  BP:  (!) 156/106  Pulse: 64 63  Resp: 20 15  Temp:    SpO2: 790% 38%    Complications: No apparent anesthesia complications

## 2016-12-29 ENCOUNTER — Encounter (INDEPENDENT_AMBULATORY_CARE_PROVIDER_SITE_OTHER): Payer: Self-pay

## 2016-12-29 ENCOUNTER — Encounter: Payer: Self-pay | Admitting: Vascular Surgery

## 2016-12-29 ENCOUNTER — Telehealth (INDEPENDENT_AMBULATORY_CARE_PROVIDER_SITE_OTHER): Payer: Self-pay

## 2016-12-29 NOTE — Telephone Encounter (Signed)
Patient would like a return to work note. He had surgery on 12/28/16.

## 2016-12-29 NOTE — Telephone Encounter (Signed)
A work note was printed for the patient, I attempted to call but his voicemail is not set-up so was unable to leave a message.

## 2016-12-30 ENCOUNTER — Emergency Department
Admission: EM | Admit: 2016-12-30 | Discharge: 2016-12-30 | Disposition: A | Discharge status: Home / Self Care | Payer: PRIVATE HEALTH INSURANCE | Attending: Emergency Medicine | Admitting: Emergency Medicine

## 2016-12-30 ENCOUNTER — Other Ambulatory Visit: Payer: Self-pay

## 2016-12-30 ENCOUNTER — Encounter: Payer: Self-pay | Admitting: Emergency Medicine

## 2016-12-30 DIAGNOSIS — Z5321 Procedure and treatment not carried out due to patient leaving prior to being seen by health care provider: Secondary | ICD-10-CM | POA: Insufficient documentation

## 2016-12-30 DIAGNOSIS — L7622 Postprocedural hemorrhage and hematoma of skin and subcutaneous tissue following other procedure: Secondary | ICD-10-CM | POA: Insufficient documentation

## 2016-12-30 NOTE — ED Triage Notes (Signed)
Pt reports surgery on Thursday for peritoneal dialysis catheter placement. Noticed today that it is bleeding around the incision site. Pt states he has not seen any drainage other than blood and does not appear to be infected.  Pt denies any other symptoms at this time.  Pt's family member cleaned it up and put new dressing on it.

## 2016-12-30 NOTE — ED Notes (Signed)
Per ED tech, pt has left prior to being seen, stating he has dialysis tomorrow morning and will let them address his concern.

## 2017-01-01 NOTE — Telephone Encounter (Signed)
The peritoneal dialysis catheter communicates inside the patient's abdomen.  As the tract matures, the patient can expect some drainage however there should not be any frank blood or purulent discharge.  The patient should place a dry gauze to the area and change it daily.  If the patient experiences any fever, nausea, vomiting or changes in bowel habits he should call the office.

## 2017-01-01 NOTE — Telephone Encounter (Signed)
Patient called and left a message on 12/31/16 stating he was bleeding from his incision and wanted to be seen. We are not open on Saturday and Sunday's so his message was retrieved on Monday morning. I attempted to contact the patient but his voicemail box is not set up so I was unable to leave a message. It does appear that he went to the ED on Saturday, he left before being seen.

## 2017-01-01 NOTE — Telephone Encounter (Signed)
Appointment will be made for the patient.

## 2017-01-02 ENCOUNTER — Emergency Department
Admission: EM | Admit: 2017-01-02 | Discharge: 2017-01-02 | Disposition: A | Discharge status: Home / Self Care | Payer: PRIVATE HEALTH INSURANCE | Attending: Emergency Medicine | Admitting: Emergency Medicine

## 2017-01-02 ENCOUNTER — Encounter: Payer: Self-pay | Admitting: Emergency Medicine

## 2017-01-02 ENCOUNTER — Other Ambulatory Visit: Payer: Self-pay

## 2017-01-02 ENCOUNTER — Telehealth (INDEPENDENT_AMBULATORY_CARE_PROVIDER_SITE_OTHER): Payer: Self-pay

## 2017-01-02 DIAGNOSIS — R58 Hemorrhage, not elsewhere classified: Secondary | ICD-10-CM

## 2017-01-02 DIAGNOSIS — Z79899 Other long term (current) drug therapy: Secondary | ICD-10-CM | POA: Diagnosis not present

## 2017-01-02 DIAGNOSIS — I120 Hypertensive chronic kidney disease with stage 5 chronic kidney disease or end stage renal disease: Secondary | ICD-10-CM | POA: Diagnosis not present

## 2017-01-02 DIAGNOSIS — B20 Human immunodeficiency virus [HIV] disease: Secondary | ICD-10-CM | POA: Insufficient documentation

## 2017-01-02 DIAGNOSIS — Y829 Unspecified medical devices associated with adverse incidents: Secondary | ICD-10-CM | POA: Insufficient documentation

## 2017-01-02 DIAGNOSIS — T8249XA Other complication of vascular dialysis catheter, initial encounter: Secondary | ICD-10-CM | POA: Insufficient documentation

## 2017-01-02 DIAGNOSIS — Z992 Dependence on renal dialysis: Secondary | ICD-10-CM | POA: Diagnosis not present

## 2017-01-02 DIAGNOSIS — N186 End stage renal disease: Secondary | ICD-10-CM | POA: Insufficient documentation

## 2017-01-02 LAB — TYPE AND SCREEN
ABO/RH(D): O POS
Antibody Screen: NEGATIVE

## 2017-01-02 LAB — COMPREHENSIVE METABOLIC PANEL
ALT: 6 U/L — ABNORMAL LOW (ref 17–63)
AST: 18 U/L (ref 15–41)
Albumin: 3.4 g/dL — ABNORMAL LOW (ref 3.5–5.0)
Alkaline Phosphatase: 64 U/L (ref 38–126)
Anion gap: 13 (ref 5–15)
BUN: 57 mg/dL — ABNORMAL HIGH (ref 6–20)
CO2: 26 mmol/L (ref 22–32)
Calcium: 9 mg/dL (ref 8.9–10.3)
Chloride: 97 mmol/L — ABNORMAL LOW (ref 101–111)
Creatinine, Ser: 10.79 mg/dL — ABNORMAL HIGH (ref 0.61–1.24)
GFR calc Af Amer: 5 mL/min — ABNORMAL LOW (ref >60)
GFR calc non Af Amer: 5 mL/min — ABNORMAL LOW (ref >60)
Glucose, Bld: 92 mg/dL (ref 65–99)
Potassium: 3.4 mmol/L — ABNORMAL LOW (ref 3.5–5.1)
Sodium: 136 mmol/L (ref 135–145)
Total Bilirubin: 0.4 mg/dL (ref 0.3–1.2)
Total Protein: 8 g/dL (ref 6.5–8.1)

## 2017-01-02 LAB — CBC
HCT: 39.4 % — ABNORMAL LOW (ref 40.0–52.0)
Hemoglobin: 13.2 g/dL (ref 13.0–18.0)
MCH: 31.8 pg (ref 26.0–34.0)
MCHC: 33.4 g/dL (ref 32.0–36.0)
MCV: 95.2 fL (ref 80.0–100.0)
Platelets: 258 10*3/uL (ref 150–440)
RBC: 4.14 MIL/uL — ABNORMAL LOW (ref 4.40–5.90)
RDW: 17.5 % — ABNORMAL HIGH (ref 11.5–14.5)
WBC: 5.9 10*3/uL (ref 3.8–10.6)

## 2017-01-02 NOTE — ED Provider Notes (Signed)
[facility] Emergency Department Provider Note ____________________________________________   I have reviewed the triage vital signs and the triage nursing note.  HISTORY  Chief Complaint Abdominal Pain   Historian Patient  HPI Louis [name] is a 56 y.o. male who had a recently placed peritoneal dialysis catheter, by Dr. Lazaro Arms, vascular surgery on 12/29/18, presented to the emergency department 2 days later on 11/17 for bleeding around the catheter on the abdomen without abdominal pain.  Patient states nothing was specifically done but dressing was reapplied.  Patient states that he change the dressing around 3 AM this morning due to it being soaked with blood, and this morning it was also soaked with blood so he came in for reevaluation of bleeding from around the abdominal perineal dialysis catheter site.  No abdominal pain.  No fevers.  No GI symptoms.  Patient does have high blood pressure and did not take his blood pressure medication this morning because he did not eat yet.  Not on blood thinners.     Past Medical History:  Diagnosis Date  . Chronic kidney disease    esrd  . Dermatophytosis of foot 06/23/2004  . Environmental allergies   . Gout   . HIV infection ([place]) 12/1995  . Hypertension   . Seborrhea 06/23/2004    Patient Active Problem List   Diagnosis Date Noted  . ESRD on dialysis ([place]) 12/19/2016  . AKI (acute kidney injury) ([place]) 08/22/2016  . Baker's cyst of knee, right 05/17/2016  . Elevated serum creatinine 05/17/2016  . Otalgia of left ear 03/29/2016  . Neck muscle spasm 03/16/2016  . Erythematous rash 03/16/2016  . ERRONEOUS ENCOUNTER--DISREGARD 03/10/2016  . Gout 01/05/2015  . Renal insufficiency 11/07/2012  . Hypertension 06/23/2010  . Rosacea 06/23/2010  . Encounter for long-term (current) use of other medications 06/09/2010  . Screening examination for venereal disease 06/09/2010  . Encounter for preventive health  examination   . Human immunodeficiency virus (HIV) disease ([place]) 01/02/1996    Past Surgical History:  Procedure Laterality Date  . CAPD INSERTION N/A 12/28/2016   Procedure: LAPAROSCOPIC INSERTION CONTINUOUS AMBULATORY PERITONEAL DIALYSIS  (CAPD) CATHETER;  Surgeon: Algernon Huxley, MD;  Location: [facility] ORS;  Service: Vascular;  Laterality: N/A;  . DIALYSIS/PERMA CATHETER INSERTION N/A 10/09/2016   Procedure: DIALYSIS/PERMA CATHETER INSERTION;  Surgeon: Algernon Huxley, MD;  Location: [facility];  Service: Cardiovascular;  Laterality: N/A;  . HERNIA REPAIR Bilateral    inguinal    Prior to Admission medications   Medication Sig Start Date End Date Taking? Authorizing Provider  amLODipine (NORVASC) 10 MG tablet Take 1 tablet (10 mg total) by mouth daily. 08/28/16   Dustin Flock, MD  atenolol (TENORMIN) 50 MG tablet Take 50 mg daily by mouth.     [provider]  b complex vitamins tablet Take 1 tablet daily by mouth.    [provider]  cloNIDine (CATAPRES) 0.1 MG tablet Take 1 tablet (0.1 mg total) by mouth 2 (two) times daily. Patient taking differently: Take 0.1 mg daily by mouth.  08/27/16   Dustin Flock, MD  colchicine 0.6 MG tablet Take 1 tablet (0.6 mg total) by mouth daily as needed. 03/16/16   Roselee Nova, MD  Dermatological Products, [place]. (LEVICYN) GEL Apply See admin instructions topically. 12/13/16   [provider]  desoximetasone (TOPICORT) 0.25 % cream Apply 1 application topically 2 (two) times daily. 01/05/16   Sable Feil, PA-C  dolutegravir (TIVICAY) 50  MG tablet once daily 11/21/16   [provider]  doxycycline (ADOXA) 100 MG tablet Take 100 mg daily by mouth.    [provider]  emtricitabine-tenofovir AF (DESCOVY) 200-25 MG tablet once daily 11/21/16   [provider]  furosemide (LASIX) 40 MG tablet Take daily by mouth.  12/06/16   [provider]  hydrALAZINE (APRESOLINE) 100 MG tablet  once daily 08/27/16   [provider]  hydrochlorothiazide (HYDRODIURIL) 25 MG tablet Take 25 mg daily by mouth.    [provider]  HYDROcodone-acetaminophen (NORCO) 5-325 MG tablet Take 1 tablet every 6 (six) hours as needed by mouth for moderate pain. 12/28/16   Algernon Huxley, MD  hydrOXYzine (ATARAX/VISTARIL) 25 MG tablet  12/07/16   [provider]  hydrOXYzine (ATARAX/VISTARIL) 50 MG tablet Take 1 tablet (50 mg total) by mouth 3 (three) times daily as needed. Patient not taking: Reported on 12/28/2016 01/05/16   Sable Feil, PA-C  ibuprofen (ADVIL,MOTRIN) 200 MG tablet Take by mouth.    [provider]  IRON, FERROUS SULFATE, PO Take 65 mg daily by mouth.    [provider]  loratadine (CLARITIN) 10 MG tablet Take 10 mg by mouth daily as needed for allergies.    [provider]  predniSONE (DELTASONE) 20 MG tablet Take 2 tablets (40 mg total) by mouth daily with breakfast. Patient not taking: Reported on 12/21/2016 10/12/16   Vaughan Basta, MD  sevelamer carbonate (RENVELA) 800 MG tablet 2 (two) times daily 11/21/16   [provider]  valACYclovir (VALTREX) 500 MG tablet Take 500 mg daily as needed by mouth.  12/06/16   [provider]  zolpidem (AMBIEN) 5 MG tablet Take 1 tablet (5 mg total) by mouth at bedtime. Patient not taking: Reported on 12/21/2016 08/27/16   Dustin Flock, MD    Allergies  Allergen Reactions  . Quinapril Hcl Swelling    Face swells    Family History  Problem Relation Age of Onset  . Bradycardia Maternal Grandmother        with pacemaker    Social History Social History   Tobacco Use  . Smoking status: Never Smoker  . Smokeless tobacco: Never Used  Substance Use Topics  . Alcohol use: Yes    Alcohol/week: 1.2 oz    Types: 2 Standard drinks or equivalent per week    Comment: Just on the weekend  . Drug use: No    Review of Systems  Constitutional: Negative for  fever. Eyes: Negative for visual changes. ENT: Negative for sore throat. Cardiovascular: Negative for chest pain. Respiratory: Negative for shortness of breath. Gastrointestinal: Negative for abdominal pain, vomiting and diarrhea. Genitourinary: Negative for dysuria. Musculoskeletal: Negative for back pain. Skin: Negative for rash. Neurological: Negative for headache.  ____________________________________________   PHYSICAL EXAM:  VITAL SIGNS: ED Triage Vitals  Enc Vitals Group     BP 01/02/17 0850 (!) 135/93     Pulse Rate 01/02/17 0850 (!) 59     Resp 01/02/17 0850 18     Temp 01/02/17 0850 98.4 F (36.9 C)     Temp Source 01/02/17 0850 Oral     SpO2 01/02/17 1130 97 %     Weight 01/02/17 0850 200 lb (90.7 kg)     Height 01/02/17 0850 6\' 2"  (1.88 m)     Head Circumference --      Peak Flow --      Pain Score 01/02/17 0859 0  Pain Loc --      Pain Edu? --      Excl. in [place]? --      Constitutional: Alert and oriented. Well appearing and in no distress. HEENT   Head: Normocephalic and atraumatic.      Eyes: Conjunctivae are normal. Pupils equal and round.       Ears:         Nose: No congestion/rhinnorhea.   Mouth/Throat: Mucous membranes are moist.   Neck: No stridor. Cardiovascular/Chest: Normal rate, regular rhythm.  No murmurs, rubs, or gallops. Respiratory: Normal respiratory effort without tachypnea nor retractions. Breath sounds are clear and equal bilaterally. No wheezes/rales/rhonchi. Gastrointestinal: Soft. No distention, no guarding, no rebound. Nontender.  Multiple surgical wounds that are nontender with Dermabond applied.  He has a left-sided peritoneal dialysis catheter where there is a tiny bit of bright red blood oozing from the skin where the catheter exits.  No palpable seroma or hematoma.  No abdominal pain. Genitourinary/rectal:Deferred Musculoskeletal: Nontender with normal range of motion in all extremities. No joint effusions.  No  lower extremity tenderness.  No edema. Neurologic:  Normal speech and language. No gross or focal neurologic deficits are appreciated. Skin:  Skin is warm, dry and intact. No rash noted. Psychiatric: Mood and affect are normal. Speech and behavior are normal. Patient exhibits appropriate insight and judgment.   ____________________________________________  LABS (pertinent positives/negatives) I, Lisa Roca, MD the attending physician have reviewed the labs noted below.  Labs Reviewed  COMPREHENSIVE METABOLIC PANEL - Abnormal; Notable for the following components:      Result Value   Potassium 3.4 (*)    Chloride 97 (*)    BUN 57 (*)    Creatinine, Ser 10.79 (*)    Albumin 3.4 (*)    ALT 6 (*)    GFR calc non Af Amer 5 (*)    GFR calc Af Amer 5 (*)    All other components within normal limits  CBC - Abnormal; Notable for the following components:   RBC 4.14 (*)    HCT 39.4 (*)    RDW 17.5 (*)    All other components within normal limits  POC OCCULT BLOOD, ED  TYPE AND SCREEN    ____________________________________________    EKG I, Lisa Roca, MD, the attending physician have personally viewed and interpreted all ECGs.  None ____________________________________________  RADIOLOGY All Xrays were viewed by me.  Imaging interpreted by Radiologist, and I, Lisa Roca, MD the attending physician have reviewed the radiologist interpretation noted below.  None __________________________________________  PROCEDURES  Procedure(s) performed: None  Critical Care performed: None   ____________________________________________  ED COURSE / ASSESSMENT AND PLAN  Pertinent labs & imaging results that were available during my care of the patient were reviewed by me and considered in my medical decision making (see chart for details).   Mr. Patlan has continued to have some oozing around the recently placed PD catheter site, without other concerning signs such as  pain or fever.  He is not on a blood thinner.  There is no obvious source of the bleeding visually, and is just a very very slow trickle although it had apparently overnight saturated the ABD pad.  Laboratory studies were overall reassuring with no drop in hemoglobin, so I do not suspect large amount of internal bleeding associated with this small amount of oozing that is quite annoying, but seems to be not life-threatening bleeding.  We discussed close management of hypertension.   Spoke  with Dr. Lucky Cowboy, confirmed no high concern for serious complication, likely just slow healing ooze from recent procedure.  Will place surgicel at site and ABD pad above.  Discussed again, to go ahead and change pad as necessary for oozing.  DIFFERENTIAL DIAGNOSIS: Including but not limited to seroma, hematoma, coagulation disorder, etc.  CONSULTATIONS:  Dr. Lucky Cowboy, vascular surgery - discussed by phone- still within normal healing -- continue to change bandages as needed.  Patient / Family / Caregiver informed of clinical course, medical decision-making process, and agree with plan.   I discussed return precautions, follow-up instructions, and discharge instructions with patient and/or family.  Discharge Instructions :You were evaluated for oozing blood from recent new PD catheter site, and it still appears to be slow healing, but no serious complication.  Please go ahead and change dressings as needed for oozing.    There may be some continued oozing for up to a week.    Return to ER for any worsening condition such as dizziness, passing out, or abdominal pain.    ___________________________________________   FINAL CLINICAL IMPRESSION(S) / ED DIAGNOSES   Final diagnoses:  Bleeding      ___________________________________________  ED Discharge Orders    None            Note: This dictation was prepared with Dragon dictation. Any transcriptional errors that result from this process are  unintentional    Lisa Roca, MD 01/02/17 1325

## 2017-01-02 NOTE — Discharge Instructions (Signed)
You were evaluated for oozing blood from recent new PD catheter site, and it still appears to be slow healing, but no serious complication.  Please go ahead and change dressings as needed for oozing.    There may be some continued oozing for up to a week.    Return to ER for any worsening condition such as dizziness, passing out, or abdominal pain.

## 2017-01-02 NOTE — ED Triage Notes (Addendum)
Post op issue with bleeding to Left Abd Quad, had a peritoneal  dialysis catheter placed last week with bright red blood oozing from site, additional abd pad applied to drsg

## 2017-01-02 NOTE — Telephone Encounter (Signed)
Patient left a message a 3:47 am this morning stating he was bleeding worse and wanted a call back. I called the patient and he stated he was using [employer] paper towel at the site for his blood loss. I advised the patient to go to the ED for evaluation and he agreed.

## 2017-01-03 ENCOUNTER — Telehealth (INDEPENDENT_AMBULATORY_CARE_PROVIDER_SITE_OTHER): Payer: Self-pay

## 2017-01-03 ENCOUNTER — Encounter (INDEPENDENT_AMBULATORY_CARE_PROVIDER_SITE_OTHER): Payer: Self-pay | Admitting: Vascular Surgery

## 2017-01-03 ENCOUNTER — Ambulatory Visit (INDEPENDENT_AMBULATORY_CARE_PROVIDER_SITE_OTHER): Payer: PRIVATE HEALTH INSURANCE | Admitting: Vascular Surgery

## 2017-01-03 VITALS — BP 136/85 | HR 68 | Resp 16 | Ht 74.0 in | Wt 204.0 lb

## 2017-01-03 DIAGNOSIS — N186 End stage renal disease: Secondary | ICD-10-CM

## 2017-01-03 DIAGNOSIS — Z992 Dependence on renal dialysis: Secondary | ICD-10-CM

## 2017-01-03 NOTE — Telephone Encounter (Signed)
Patient is at dialysis and is still actively bleeding, per the nurse, heparin was held because of his active bleeding. He was seen in the ED yesterday and a powder was placed but it doesn't seem to be helping.

## 2017-01-03 NOTE — Telephone Encounter (Signed)
Patient will come in later today after dialysis.

## 2017-01-03 NOTE — Progress Notes (Signed)
Subjective:    Patient ID: Louis [name], male    DOB: 07-Sep-1960, 56 y.o.   MRN: [id number] Chief Complaint  Patient presents with  . Follow-up    Access bleed   Patient presents with a chief complaint of bleeding from incision site since surgery.  The patient is status post a peritoneal dialysis catheter insertion on December 28, 2016.  The patient has been experiencing a steady oozing from the catheter tract site.  He was seen at [facility] emergency room yesterday where a piece of Surgicel was placed over the incision and covered with a piece of gauze.  The patient's dialysis center called our office today stating that he is continuing to ooze from the site.  The patient was placed on my schedule.  The patient went to dialysis today and states he was told his catheter is draining appropriately.  He denies any drainage from the other incisions.  He denies any fever, nausea or vomiting.  Patient denies any shortness of breath or chest pain.   Review of Systems  Constitutional: Negative.   HENT: Negative.   Eyes: Negative.   Respiratory: Negative.   Cardiovascular: Negative.   Gastrointestinal: Negative.   Endocrine: Negative.   Genitourinary: Negative.   Musculoskeletal: Negative.   Skin: Positive for wound.  Allergic/Immunologic: Negative.   Neurological: Negative.   Hematological: Negative.   Psychiatric/Behavioral: Negative.       Objective:   Physical Exam  Constitutional: He is oriented to person, place, and time. He appears well-developed and well-nourished.  HENT:  Head: Normocephalic and atraumatic.  Eyes: Conjunctivae are normal. Pupils are equal, round, and reactive to light.  Neck: Normal range of motion.  Cardiovascular: Normal rate, regular rhythm and normal heart sounds.  Pulmonary/Chest: Effort normal and breath sounds normal.  Abdominal: Soft. Bowel sounds are normal.  Peritoneal dialysis access tract: Small amount of bright red  oozing noted.  No purulent discharge.  Other incisions are intact and sealed with Dermabond.  Musculoskeletal: Normal range of motion.  Neurological: He is alert and oriented to person, place, and time.  Skin: Skin is warm and dry.  Psychiatric: He has a normal mood and affect. His behavior is normal. Judgment and thought content normal.  Vitals reviewed.  BP 136/85 (BP Location: Right Arm)   Pulse 68   Resp 16   Ht 6\' 2"  (1.88 m)   Wt 204 lb (92.5 kg)   BMI 26.19 kg/m   Past Medical History:  Diagnosis Date  . Chronic kidney disease    esrd  . Dermatophytosis of foot 06/23/2004  . Environmental allergies   . Gout   . HIV infection ([place]) 12/1995  . Hypertension   . Seborrhea 06/23/2004   Social History   Socioeconomic History  . Marital status: Divorced    Spouse name: Not on file  . Number of children: Not on file  . Years of education: Not on file  . Highest education level: Not on file  Social Needs  . Financial resource strain: Not on file  . Food insecurity - worry: Not on file  . Food insecurity - inability: Not on file  . Transportation needs - medical: Not on file  . Transportation needs - non-medical: Not on file  Occupational History  . Not on file  Tobacco Use  . Smoking status: Never Smoker  . Smokeless tobacco: Never Used  Substance and Sexual Activity  . Alcohol use: Yes    Alcohol/week: 1.2  oz    Types: 2 Standard drinks or equivalent per week    Comment: Just on the weekend  . Drug use: No  . Sexual activity: Yes    Partners: Female    Comment: declined condoms  Other Topics Concern  . Not on file  Social History Narrative  . Not on file   Past Surgical History:  Procedure Laterality Date  . CAPD INSERTION N/A 12/28/2016   Procedure: LAPAROSCOPIC INSERTION CONTINUOUS AMBULATORY PERITONEAL DIALYSIS  (CAPD) CATHETER;  Surgeon: Algernon Huxley, MD;  Location: [facility] ORS;  Service: Vascular;  Laterality: N/A;  . DIALYSIS/PERMA CATHETER INSERTION N/A  10/09/2016   Procedure: DIALYSIS/PERMA CATHETER INSERTION;  Surgeon: Algernon Huxley, MD;  Location: [place] [facility];  Service: Cardiovascular;  Laterality: N/A;  . HERNIA REPAIR Bilateral    inguinal   Family History  Problem Relation Age of Onset  . Bradycardia Maternal Grandmother        with pacemaker   Allergies  Allergen Reactions  . Quinapril Hcl Swelling    Face swells      Assessment & Plan:  Patient presents with a chief complaint of bleeding from incision site since surgery.  The patient is status post a peritoneal dialysis catheter insertion on December 28, 2016.  The patient has been experiencing a steady oozing from the catheter tract site.  He was seen at [facility] emergency room yesterday where a piece of Surgicel was placed over the incision and covered with a piece of gauze.  The patient's dialysis center called our office today stating that he is continuing to ooze from the site.  The patient was placed on my schedule.  The patient went to dialysis today and states he was told his catheter is draining appropriately.  He denies any drainage from the other incisions.  He denies any fever, nausea or vomiting.  Patient denies any shortness of breath or chest pain.  1. ESRD on dialysis [facility]) s/p peritoneal dialysis catheter insertion - New Patient presents after being seen in the emergency room yesterday for continued oozing from his catheter tract since surgery. I removed the patient's dressing and noted a small amount of oozing from the lateral aspect of the peritoneal dialysis catheter tract. I used silver nitrate to the lateral aspect of the catheter tract. I also packed Surgicel into the catheter tract Hemostasis was achieved and a clean dressing was placed The patient was instructed to seek medical attention in the emergency room if bleeding should restart/continue The patient expresses understanding The patient is to keep his dental follow-up  appointment next week.  Current Outpatient Medications on File Prior to Visit  Medication Sig Dispense Refill  . amLODipine (NORVASC) 10 MG tablet Take 1 tablet (10 mg total) by mouth daily. 30 tablet 0  . atenolol (TENORMIN) 50 MG tablet Take 50 mg daily by mouth.     Marland Kitchen b complex vitamins tablet Take 1 tablet daily by mouth.    . cloNIDine (CATAPRES) 0.1 MG tablet Take 1 tablet (0.1 mg total) by mouth 2 (two) times daily. (Patient taking differently: Take 0.1 mg daily by mouth. ) 60 tablet 11  . colchicine 0.6 MG tablet Take 1 tablet (0.6 mg total) by mouth daily as needed. 30 tablet 0  . Dermatological Products, Misc. (LEVICYN) GEL Apply See admin instructions topically.  2  . desoximetasone (TOPICORT) 0.25 % cream Apply 1 application topically 2 (two) times daily. 30 g 0  . dolutegravir (TIVICAY)  50 MG tablet once daily    . doxycycline (ADOXA) 100 MG tablet Take 100 mg daily by mouth.    Marland Kitchen emtricitabine-tenofovir AF (DESCOVY) 200-25 MG tablet once daily    . furosemide (LASIX) 40 MG tablet Take daily by mouth.     . hydrALAZINE (APRESOLINE) 100 MG tablet once daily    . hydrochlorothiazide (HYDRODIURIL) 25 MG tablet Take 25 mg daily by mouth.    Marland Kitchen HYDROcodone-acetaminophen (NORCO) 5-325 MG tablet Take 1 tablet every 6 (six) hours as needed by mouth for moderate pain. 30 tablet 0  . hydrOXYzine (ATARAX/VISTARIL) 25 MG tablet     . hydrOXYzine (ATARAX/VISTARIL) 50 MG tablet Take 1 tablet (50 mg total) by mouth 3 (three) times daily as needed. (Patient not taking: Reported on 12/28/2016) 30 tablet 0  . ibuprofen (ADVIL,MOTRIN) 200 MG tablet Take by mouth.    . IRON, FERROUS SULFATE, PO Take 65 mg daily by mouth.    . loratadine (CLARITIN) 10 MG tablet Take 10 mg by mouth daily as needed for allergies.    . predniSONE (DELTASONE) 20 MG tablet Take 2 tablets (40 mg total) by mouth daily with breakfast. (Patient not taking: Reported on 12/21/2016) 3 tablet 0  . sevelamer carbonate (RENVELA) 800  MG tablet 2 (two) times daily    . valACYclovir (VALTREX) 500 MG tablet Take 500 mg daily as needed by mouth.     . zolpidem (AMBIEN) 5 MG tablet Take 1 tablet (5 mg total) by mouth at bedtime. (Patient not taking: Reported on 12/21/2016) 20 tablet 0   No current facility-administered medications on file prior to visit.    There are no Patient Instructions on file for this visit. No Follow-up on file.  Clare Casto A Beola Vasallo, PA-C

## 2017-01-18 ENCOUNTER — Encounter (INDEPENDENT_AMBULATORY_CARE_PROVIDER_SITE_OTHER): Payer: PRIVATE HEALTH INSURANCE | Admitting: Vascular Surgery

## 2017-02-15 ENCOUNTER — Other Ambulatory Visit (INDEPENDENT_AMBULATORY_CARE_PROVIDER_SITE_OTHER): Payer: Self-pay | Admitting: Vascular Surgery

## 2017-02-19 ENCOUNTER — Encounter: Payer: Self-pay | Admitting: *Deleted

## 2017-02-19 ENCOUNTER — Encounter
Admission: RE | Disposition: A | Discharge status: Home / Self Care | Payer: Self-pay | Source: Ambulatory Visit | Attending: Vascular Surgery

## 2017-02-19 ENCOUNTER — Ambulatory Visit
Admission: RE | Admit: 2017-02-19 | Discharge: 2017-02-19 | Disposition: A | Discharge status: Home / Self Care | Payer: PRIVATE HEALTH INSURANCE | Source: Ambulatory Visit | Attending: Vascular Surgery | Admitting: Vascular Surgery

## 2017-02-19 DIAGNOSIS — I120 Hypertensive chronic kidney disease with stage 5 chronic kidney disease or end stage renal disease: Secondary | ICD-10-CM | POA: Insufficient documentation

## 2017-02-19 DIAGNOSIS — N186 End stage renal disease: Secondary | ICD-10-CM

## 2017-02-19 DIAGNOSIS — B20 Human immunodeficiency virus [HIV] disease: Secondary | ICD-10-CM | POA: Insufficient documentation

## 2017-02-19 DIAGNOSIS — Z992 Dependence on renal dialysis: Secondary | ICD-10-CM | POA: Diagnosis not present

## 2017-02-19 DIAGNOSIS — Z8249 Family history of ischemic heart disease and other diseases of the circulatory system: Secondary | ICD-10-CM | POA: Insufficient documentation

## 2017-02-19 DIAGNOSIS — Z452 Encounter for adjustment and management of vascular access device: Secondary | ICD-10-CM | POA: Insufficient documentation

## 2017-02-19 DIAGNOSIS — Z888 Allergy status to other drugs, medicaments and biological substances status: Secondary | ICD-10-CM | POA: Diagnosis not present

## 2017-02-19 DIAGNOSIS — Z9889 Other specified postprocedural states: Secondary | ICD-10-CM | POA: Diagnosis not present

## 2017-02-19 DIAGNOSIS — I10 Essential (primary) hypertension: Secondary | ICD-10-CM | POA: Diagnosis not present

## 2017-02-19 HISTORY — PX: DIALYSIS/PERMA CATHETER REMOVAL: CATH118289

## 2017-02-19 SURGERY — DIALYSIS/PERMA CATHETER REMOVAL
Anesthesia: LOCAL

## 2017-02-19 MED ORDER — LIDOCAINE-EPINEPHRINE (PF) 1 %-1:200000 IJ SOLN
INTRAMUSCULAR | Status: AC
  Filled 2017-02-19: qty 30

## 2017-02-19 MED ORDER — LIDOCAINE-EPINEPHRINE (PF) 1 %-1:200000 IJ SOLN
INTRAMUSCULAR | Status: DC | PRN
  Administered 2017-02-19: 20 mL via INTRADERMAL

## 2017-02-19 SURGICAL SUPPLY — 2 items
FORCEPS HALSTEAD CVD 5IN STRL (INSTRUMENTS) ×1 IMPLANT
TRAY LACERAT/PLASTIC (MISCELLANEOUS) ×2 IMPLANT

## 2017-02-19 NOTE — Op Note (Signed)
Operative Note     Preoperative diagnosis:   1. ESRD with functional permanent access  Postoperative diagnosis:  1. ESRD with functional permanent access  Procedure:  Removal of right jugular Permcath  Surgeon:  Leotis Pain, MD  Anesthesia:  Local  EBL:  2 cc  Indication for the Procedure:  The patient has a functional permanent dialysis access and no longer needs their permcath.  This can be removed.  Risks and benefits are discussed and informed consent is obtained.  Description of the Procedure:  The patient's right neck, chest and existing catheter were sterilely prepped and draped. The area around the catheter was anesthetized copiously with 1% lidocaine. The catheter was dissected out with curved hemostats until the cuff was freed from the surrounding fibrous sheath. The fiber sheath was transected, and the catheter was then removed in its entirety using gentle traction. Pressure was held and sterile dressings were placed. The patient tolerated the procedure well and was taken to the recovery room in stable condition.     Leotis Pain  02/19/2017, 2:28 PM This note was created with Dragon Medical transcription system. Any errors in dictation are purely unintentional.

## 2017-02-19 NOTE — H&P (Signed)
[facility] Admission History & Physical  MRN : [id number]  Louis [name] is a 57 y.o. ([birth year]) male who presents with chief complaint of No chief complaint on file. Marland Kitchen  History of Present Illness: I am asked to evaluate the patient by the dialysis center. The patient was sent here because they have a nonfunctioning tunneled catheter and a functioning PD catheter.  The patient reports they're not been any problems with any of their dialysis runs. They are reporting good flows with good parameters at dialysis. The PD catheter has been working well up to this point  Patient denies pain or tenderness overlying the access.  There is no pain with dialysis.  The patient denies hand pain or finger pain consistent with steal syndrome.  No fevers or chills while on dialysis.   No current facility-administered medications for this encounter.     Past Medical History:  Diagnosis Date  . Chronic kidney disease    esrd  . Dermatophytosis of foot 06/23/2004  . Environmental allergies   . Gout   . HIV infection ([place]) 12/1995  . Hypertension   . Seborrhea 06/23/2004    Past Surgical History:  Procedure Laterality Date  . CAPD INSERTION N/A 12/28/2016   Procedure: LAPAROSCOPIC INSERTION CONTINUOUS AMBULATORY PERITONEAL DIALYSIS  (CAPD) CATHETER;  Surgeon: Algernon Huxley, MD;  Location: [facility] ORS;  Service: Vascular;  Laterality: N/A;  . DIALYSIS/PERMA CATHETER INSERTION N/A 10/09/2016   Procedure: DIALYSIS/PERMA CATHETER INSERTION;  Surgeon: Algernon Huxley, MD;  Location: [place] [facility];  Service: Cardiovascular;  Laterality: N/A;  . HERNIA REPAIR Bilateral    inguinal    Social History Social History   Tobacco Use  . Smoking status: Never Smoker  . Smokeless tobacco: Never Used  Substance Use Topics  . Alcohol use: Yes    Alcohol/week: 1.2 oz    Types: 2 Standard drinks or equivalent per week    Comment: Just on the weekend  . Drug use: No     Family History Family History  Problem Relation Age of Onset  . Bradycardia Maternal Grandmother        with pacemaker    No family history of bleeding or clotting disorders, autoimmune disease or porphyria  Allergies  Allergen Reactions  . Quinapril Hcl Swelling    Face swells     REVIEW OF SYSTEMS (Negative unless checked)  Constitutional: [] Weight loss  [] Fever  [] Chills Cardiac: [] Chest pain   [] Chest pressure   [] Palpitations   [] Shortness of breath when laying flat   [] Shortness of breath at rest   [x] Shortness of breath with exertion. Vascular:  [] Pain in legs with walking   [] Pain in legs at rest   [] Pain in legs when laying flat   [] Claudication   [] Pain in feet when walking  [] Pain in feet at rest  [] Pain in feet when laying flat   [] History of DVT   [] Phlebitis   [] Swelling in legs   [] Varicose veins   [] Non-healing ulcers Pulmonary:   [] Uses home oxygen   [] Productive cough   [] Hemoptysis   [] Wheeze  [] COPD   [] Asthma Neurologic:  [] Dizziness  [] Blackouts   [] Seizures   [] History of stroke   [] History of TIA  [] Aphasia   [] Temporary blindness   [] Dysphagia   [] Weakness or numbness in arms   [] Weakness or numbness in legs Musculoskeletal:  [] Arthritis   [] Joint swelling   [] Joint pain   [] Low back pain Hematologic:  [] Easy bruising  []   Easy bleeding   [] Hypercoagulable state   [] Anemic  [] Hepatitis Gastrointestinal:  [] Blood in stool   [] Vomiting blood  [] Gastroesophageal reflux/heartburn   [] Difficulty swallowing. Genitourinary:  [x] Chronic kidney disease   [] Difficult urination  [] Frequent urination  [] Burning with urination   [] Blood in urine Skin:  [] Rashes   [] Ulcers   [] Wounds Psychological:  [] History of anxiety   []  History of major depression.  Physical Examination  There were no vitals filed for this visit. There is no height or weight on file to calculate BMI. Gen: WD/WN, NAD Head: [place]/AT, No temporalis wasting. Ear/Nose/Throat: Hearing grossly intact,  nares w/o erythema or drainage, oropharynx w/o Erythema/Exudate,  Eyes: Conjunctiva clear, sclera non-icteric Neck: Trachea midline.  No JVD.  Pulmonary:  Good air movement, respirations not labored, no use of accessory muscles.  Cardiac: RRR, normal S1, S2. Vascular:  Vessel Right Left  Radial Palpable Palpable              Gastrointestinal: soft, non-tender/non-distended. PD catheter in place Musculoskeletal: M/S 5/5 throughout.  Extremities without ischemic changes.  No deformity or atrophy.  Neurologic: Sensation grossly intact in extremities.  Symmetrical.  Speech is fluent. Motor exam as listed above. Psychiatric: Judgment intact, Mood & affect appropriate for pt's clinical situation. Dermatologic: No rashes or ulcers noted.  No cellulitis or open wounds.    CBC Lab Results  Component Value Date   WBC 5.9 01/02/2017   HGB 13.2 01/02/2017   HCT 39.4 (L) 01/02/2017   MCV 95.2 01/02/2017   PLT 258 01/02/2017    BMET    Component Value Date/Time   NA 136 01/02/2017 0906   K 3.4 (L) 01/02/2017 0906   CL 97 (L) 01/02/2017 0906   CO2 26 01/02/2017 0906   GLUCOSE 92 01/02/2017 0906   BUN 57 (H) 01/02/2017 0906   CREATININE 10.79 (H) 01/02/2017 0906   CREATININE 3.26 (H) 05/11/2016 1631   CALCIUM 9.0 01/02/2017 0906   GFRNONAA 5 (L) 01/02/2017 0906   GFRNONAA 20 (L) 05/11/2016 1631   GFRAA 5 (L) 01/02/2017 0906   GFRAA 24 (L) 05/11/2016 1631   CrCl cannot be calculated (Patient's most recent lab result is older than the maximum 21 days allowed.).  COAG Lab Results  Component Value Date   INR 1.07 12/21/2016   INR 0.92 08/24/2016    Radiology No results found.  Assessment/Plan 1.  Complication dialysis device with thrombosis AV access:  Patient's Tunneled catheter is not being used. The patient has a PD catheter that is functioning well. Therefore, the patient will undergo removal of the tunneled catheter under local anesthesia.  The risks and benefits were  described to the patient.  All questions were answered.  The patient agrees to proceed with angiography and intervention. Potassium will be drawn to ensure that it is an appropriate level prior to performing intervention. 2.  End-stage renal disease requiring hemodialysis:  Patient will continue dialysis therapy without further interruption if a successful intervention is not achieved then a tunneled catheter will be placed. Dialysis has already been arranged. 3.  Hypertension:  Patient will continue medical management; nephrology is following no changes in oral medications. 4.  HIV: on outpatient medications.    Leotis Pain, MD  02/19/2017 1:31 PM

## 2017-02-19 NOTE — Discharge Instructions (Signed)
Tunneled Catheter Removal, Care After °Refer to this sheet in the next few weeks. These instructions provide you with information about caring for yourself after your procedure. Your health care provider may also give you more specific instructions. Your treatment has been planned according to current medical practices, but problems sometimes occur. Call your health care provider if you have any problems or questions after your procedure. °What can I expect after the procedure? °After the procedure, it is common to have: °· Some mild redness, swelling, and pain around your catheter site. ° ° °Follow these instructions at home: °Incision care  °· Check your removal site  every day for signs of infection. Check for: °¨ More redness, swelling, or pain. °¨ More fluid or blood. °¨ Warmth. °¨ Pus or a bad smell. °· Follow instructions from your health care provider about how to take care of your removal site. Make sure you: °¨ Wash your hands with soap and water before you change your bandages (dressings). If soap and water are not available, use hand sanitizer. °Activity  °· Return to your normal activities as told by your health care provider. Ask your health care provider what activities are safe for you. °· Do not lift anything that is heavier than 10 lb (4.5 kg) for 3 weeks or as long as told by your health care provider. ° °Contact a health care provider if: °· You have more fluid or blood coming from your removal site °· You have more redness, swelling, or pain at your incisions or around the area where your catheter was removed °· Your removal site feel warm to the touch. °· You feel unusually weak. °· You feel nauseous.. °· Get help right away if °· You have swelling in your arm, shoulder, neck, or face. °· You develop chest pain. °· You have difficulty breathing. °· You feel dizzy or light-headed. °· You have pus or a bad smell coming from your removal site °· You have a fever. °· You develop bleeding from your  removal site, and your bleeding does not stop. °This information is not intended to replace advice given to you by your health care provider. Make sure you discuss any questions you have with your health care provider. °Document Released: 01/17/2012 Document Revised: 10/03/2015 Document Reviewed: 10/26/2014 °Elsevier Interactive Patient Education © 2017 [employer] ° °

## 2017-02-20 ENCOUNTER — Encounter: Payer: Self-pay | Admitting: Vascular Surgery

## 2017-03-31 ENCOUNTER — Other Ambulatory Visit: Payer: Self-pay

## 2017-03-31 ENCOUNTER — Emergency Department
Admission: EM | Admit: 2017-03-31 | Discharge: 2017-03-31 | Disposition: A | Discharge status: Home / Self Care | Payer: PRIVATE HEALTH INSURANCE | Attending: Emergency Medicine | Admitting: Emergency Medicine

## 2017-03-31 ENCOUNTER — Encounter: Payer: Self-pay | Admitting: Emergency Medicine

## 2017-03-31 DIAGNOSIS — I120 Hypertensive chronic kidney disease with stage 5 chronic kidney disease or end stage renal disease: Secondary | ICD-10-CM | POA: Diagnosis not present

## 2017-03-31 DIAGNOSIS — H6123 Impacted cerumen, bilateral: Secondary | ICD-10-CM | POA: Diagnosis not present

## 2017-03-31 DIAGNOSIS — Z79899 Other long term (current) drug therapy: Secondary | ICD-10-CM | POA: Insufficient documentation

## 2017-03-31 DIAGNOSIS — Z992 Dependence on renal dialysis: Secondary | ICD-10-CM | POA: Diagnosis not present

## 2017-03-31 DIAGNOSIS — H9203 Otalgia, bilateral: Secondary | ICD-10-CM | POA: Diagnosis not present

## 2017-03-31 DIAGNOSIS — N186 End stage renal disease: Secondary | ICD-10-CM | POA: Diagnosis not present

## 2017-03-31 MED ORDER — AMOXICILLIN-POT CLAVULANATE 875-125 MG PO TABS: 1.0000 | ORAL_TABLET | Freq: Two times a day (BID) | ORAL | 0 refills | Status: AC

## 2017-03-31 MED ORDER — AMOXICILLIN-POT CLAVULANATE 875-125 MG PO TABS: 1.0000 | ORAL_TABLET | Freq: Two times a day (BID) | ORAL | 0 refills | Status: DC

## 2017-03-31 MED ORDER — CARBAMIDE PEROXIDE 6.5 % OT SOLN: 5.0000 [drp] | Freq: Two times a day (BID) | OTIC | 2 refills | Status: DC

## 2017-03-31 MED ORDER — CARBAMIDE PEROXIDE 6.5 % OT SOLN: 5.0000 [drp] | Freq: Two times a day (BID) | OTIC | 0 refills | Status: AC

## 2017-03-31 MED ORDER — CARBAMIDE PEROXIDE 6.5 % OT SOLN: 5.0000 [drp] | Freq: Two times a day (BID) | OTIC | 0 refills | Status: DC

## 2017-03-31 NOTE — ED Triage Notes (Signed)
Bilateral earache x 2 weeks.

## 2017-03-31 NOTE — ED Provider Notes (Signed)
[facility] Emergency Department Provider Note  ____________________________________________  Time seen: Approximately 10:45 AM  I have reviewed the triage vital signs and the nursing notes.   HISTORY  Chief Complaint Otalgia    HPI Louis [name] is a 57 y.o. male that presents to the emergency department for evaluation of bilateral ear pain for 2 weeks.  Patient called the pharmacist, who told him he might need antibiotics and to come to the ED.  Patient states that he has been told that he has earwax before and has had difficulty with the flushing procedure.  No fever, chills, nasal congestion, shortness of breath, chest pain, nausea, vomiting, abdominal pain.   Past Medical History:  Diagnosis Date  . Chronic kidney disease    esrd  . Dermatophytosis of foot 06/23/2004  . Environmental allergies   . Gout   . HIV infection ([place]) 12/1995  . Hypertension   . Seborrhea 06/23/2004    Patient Active Problem List   Diagnosis Date Noted  . ESRD on dialysis ([place]) 12/19/2016  . AKI (acute kidney injury) ([place]) 08/22/2016  . Baker's cyst of knee, right 05/17/2016  . Elevated serum creatinine 05/17/2016  . Otalgia of left ear 03/29/2016  . Neck muscle spasm 03/16/2016  . Erythematous rash 03/16/2016  . ERRONEOUS ENCOUNTER--DISREGARD 03/10/2016  . Gout 01/05/2015  . Renal insufficiency 11/07/2012  . Hypertension 06/23/2010  . Rosacea 06/23/2010  . Encounter for long-term (current) use of other medications 06/09/2010  . Screening examination for venereal disease 06/09/2010  . Encounter for preventive health examination   . Human immunodeficiency virus (HIV) disease ([place]) 01/02/1996    Past Surgical History:  Procedure Laterality Date  . CAPD INSERTION N/A 12/28/2016   Procedure: LAPAROSCOPIC INSERTION CONTINUOUS AMBULATORY PERITONEAL DIALYSIS  (CAPD) CATHETER;  Surgeon: Algernon Huxley, MD;  Location: [facility] ORS;  Service: Vascular;  Laterality: N/A;  .  DIALYSIS/PERMA CATHETER INSERTION N/A 10/09/2016   Procedure: DIALYSIS/PERMA CATHETER INSERTION;  Surgeon: Algernon Huxley, MD;  Location: [place] [facility];  Service: Cardiovascular;  Laterality: N/A;  . DIALYSIS/PERMA CATHETER REMOVAL N/A 02/19/2017   Procedure: DIALYSIS/PERMA CATHETER REMOVAL;  Surgeon: Algernon Huxley, MD;  Location: [place] [facility];  Service: Cardiovascular;  Laterality: N/A;  . HERNIA REPAIR Bilateral    inguinal    Prior to Admission medications   Medication Sig Start Date End Date Taking? Authorizing Provider  amLODipine (NORVASC) 10 MG tablet Take 1 tablet (10 mg total) by mouth daily. 08/28/16   Dustin Flock, MD  amoxicillin-clavulanate (AUGMENTIN) 875-125 MG tablet Take 1 tablet by mouth 2 (two) times daily for 10 days. 03/31/17 04/10/17  Laban Emperor, PA-C  atenolol (TENORMIN) 100 MG tablet Take 100 mg by mouth daily.    [provider]  carbamide peroxide (DEBROX) 6.5 % OTIC solution Place 5 drops into the right ear 2 (two) times daily for 5 days. 03/31/17 04/05/17  Laban Emperor, PA-C  cloNIDine (CATAPRES) 0.1 MG tablet Take 1 tablet (0.1 mg total) by mouth 2 (two) times daily. Patient taking differently: Take 0.1 mg daily by mouth.  08/27/16   Dustin Flock, MD  colchicine 0.6 MG tablet Take 1 tablet (0.6 mg total) by mouth daily as needed. Patient taking differently: Take 0.6 mg by mouth daily as needed (for gout flare ups.).  03/16/16   Roselee Nova, MD  Dermatological Products, [place]. (LEVICYN) GEL Apply 1 application topically 2 (two) times daily as needed (for itchy skin.).  12/13/16  [provider]  doxycycline (VIBRA-TABS) 100 MG tablet Take 100 mg by mouth 2 (two) times daily as needed (for skin rash).    [provider]  furosemide (LASIX) 40 MG tablet Take 40 mg by mouth daily.  12/06/16   [provider]  gentamicin cream (GARAMYCIN) 0.1 % Apply 1 application topically daily as needed. Itchy/irritated skin.  02/01/17   [provider]  HYDROcodone-acetaminophen (NORCO) 5-325 MG tablet Take 1 tablet every 6 (six) hours as needed by mouth for moderate pain. Patient not taking: Reported on 02/15/2017 12/28/16   Algernon Huxley, MD  hydrOXYzine (ATARAX/VISTARIL) 25 MG tablet Take 25 mg by mouth 2 (two) times daily as needed for itching.  12/07/16   [provider]  ibuprofen (ADVIL,MOTRIN) 200 MG tablet Take 400 mg by mouth every 8 (eight) hours as needed (for pain.).    [provider]  loratadine (CLARITIN) 10 MG tablet Take 10 mg by mouth daily.    [provider]  predniSONE (DELTASONE) 20 MG tablet Take 2 tablets (40 mg total) by mouth daily with breakfast. Patient not taking: Reported on 12/21/2016 10/12/16   Vaughan Basta, MD  valACYclovir (VALTREX) 500 MG tablet Take 500 mg by mouth daily as needed (for flare ups).  12/06/16   [provider]  zolpidem (AMBIEN) 5 MG tablet Take 1 tablet (5 mg total) by mouth at bedtime. Patient taking differently: Take 5 mg by mouth at bedtime as needed (for sleep.).  08/27/16   Dustin Flock, MD    Allergies Quinapril hcl  Family History  Problem Relation Age of Onset  . Bradycardia Maternal Grandmother        with pacemaker    Social History Social History   Tobacco Use  . Smoking status: Never Smoker  . Smokeless tobacco: Never Used  Substance Use Topics  . Alcohol use: Yes    Alcohol/week: 1.2 oz    Types: 2 Standard drinks or equivalent per week    Comment: Just on the weekend  . Drug use: No     Review of Systems  Constitutional: No fever/chills Cardiovascular: No chest pain. Respiratory: No SOB. Gastrointestinal: No abdominal pain.  No vomiting.  Musculoskeletal: Negative for musculoskeletal pain. Skin: Negative for rash, abrasions, lacerations, ecchymosis.   ____________________________________________   PHYSICAL EXAM:  VITAL SIGNS: ED Triage Vitals [03/31/17 0834]  Enc  Vitals Group     BP (!) 145/83     Pulse Rate 62     Resp 20     Temp 98.1 F (36.7 C)     Temp Source Oral     SpO2 100 %     Weight 195 lb (88.5 kg)     Height 6\' 2"  (1.88 m)     Head Circumference      Peak Flow      Pain Score 6     Pain Loc      Pain Edu?      Excl. in [place]?      Constitutional: Alert and oriented. Well appearing and in no acute distress. Eyes: Conjunctivae are normal. PERRL. EOMI. Head: Atraumatic. ENT:      Ears: Impacted cerumen in both ear canals.      Nose: No congestion/rhinnorhea.      Mouth/Throat: Mucous membranes are moist.  Neck: No stridor.   Cardiovascular: Normal rate, regular rhythm.  Good peripheral circulation. Respiratory: Normal respiratory effort without tachypnea or retractions. Lungs CTAB. Good air entry to the bases  with no decreased or absent breath sounds. Musculoskeletal: Full range of motion to all extremities. No gross deformities appreciated. Neurologic:  Normal speech and language. No gross focal neurologic deficits are appreciated.  Skin:  Skin is warm, dry and intact. No rash noted. Psychiatric: Mood and affect are normal. Speech and behavior are normal. Patient exhibits appropriate insight and judgement.   ____________________________________________   LABS (all labs ordered are listed, but only abnormal results are displayed)  Labs Reviewed - No data to display ____________________________________________  EKG   ____________________________________________  RADIOLOGY   No results found.  ____________________________________________    PROCEDURES  Procedure(s) performed:    Procedures    Medications - No data to display   ____________________________________________   INITIAL IMPRESSION / ASSESSMENT AND PLAN / ED COURSE  Pertinent labs & imaging results that were available during my care of the patient were reviewed by me and considered in my medical decision making (see chart for  details).  Review of the [place] CSRS was performed in accordance of the [place] prior to dispensing any controlled drugs.     P patient presented to the emergency department for evaluation of ear pain for 2 weeks.  Vital signs and exam are reassuring.  There is excessive cerumen in bilateral ear canals.  Patient has had trouble with flushing procedure in the past.  He will use Debrox for 4 days and follow-up with ENT or PCP for removal.  Tympanic membrane is not visualized so he will be covered for infection.  Patient will be discharged home with prescriptions for Augmentin and Debrox. Patient is to follow up with PCP as directed. Patient is given ED precautions to return to the ED for any worsening or new symptoms.     ____________________________________________  FINAL CLINICAL IMPRESSION(S) / ED DIAGNOSES  Final diagnoses:  Otalgia of both ears  Excessive cerumen in both ear canals      NEW MEDICATIONS STARTED DURING THIS VISIT:  ED Discharge Orders        Ordered    amoxicillin-clavulanate (AUGMENTIN) 875-125 MG tablet  2 times daily,   Status:  Discontinued     03/31/17 1033    carbamide peroxide (DEBROX) 6.5 % OTIC solution  2 times daily,   Status:  Discontinued     03/31/17 1033    amoxicillin-clavulanate (AUGMENTIN) 875-125 MG tablet  2 times daily     03/31/17 1034    carbamide peroxide (DEBROX) 6.5 % OTIC solution  2 times daily,   Status:  Discontinued     03/31/17 1034    carbamide peroxide (DEBROX) 6.5 % OTIC solution  2 times daily     03/31/17 1210          This chart was dictated using voice recognition software/Dragon. Despite best efforts to proofread, errors can occur which can change the meaning. Any change was purely unintentional.    Laban Emperor, PA-C 03/31/17 1559    Nena Polio, MD 04/01/17 [phone]

## 2017-03-31 NOTE — ED Notes (Signed)
Pt. Verbalizes understanding of d/c instructions, medications, and follow-up. Pt in hurry to leave and this RN unable to obtain vital signs. Pt. In NAD at time of d/c and denies further concerns regarding this visit. Pt. Stable at the time of departure from the unit, departing unit by the safest and most appropriate manner per that pt condition and limitations with all belongings accounted for. Pt advised to return to the ED at any time for emergent concerns, or for new/worsening symptoms.

## 2017-04-14 ENCOUNTER — Other Ambulatory Visit: Payer: Self-pay | Admitting: Family Medicine

## 2017-04-14 DIAGNOSIS — I10 Essential (primary) hypertension: Secondary | ICD-10-CM

## 2017-04-16 ENCOUNTER — Other Ambulatory Visit: Payer: Self-pay

## 2017-04-16 ENCOUNTER — Emergency Department
Admission: EM | Admit: 2017-04-16 | Discharge: 2017-04-16 | Disposition: A | Discharge status: Home / Self Care | Payer: PRIVATE HEALTH INSURANCE | Attending: Emergency Medicine | Admitting: Emergency Medicine

## 2017-04-16 ENCOUNTER — Encounter: Payer: Self-pay | Admitting: Emergency Medicine

## 2017-04-16 DIAGNOSIS — M436 Torticollis: Secondary | ICD-10-CM | POA: Insufficient documentation

## 2017-04-16 DIAGNOSIS — B20 Human immunodeficiency virus [HIV] disease: Secondary | ICD-10-CM | POA: Diagnosis not present

## 2017-04-16 DIAGNOSIS — Z79899 Other long term (current) drug therapy: Secondary | ICD-10-CM | POA: Insufficient documentation

## 2017-04-16 DIAGNOSIS — H6983 Other specified disorders of Eustachian tube, bilateral: Secondary | ICD-10-CM | POA: Diagnosis not present

## 2017-04-16 DIAGNOSIS — Z992 Dependence on renal dialysis: Secondary | ICD-10-CM | POA: Insufficient documentation

## 2017-04-16 DIAGNOSIS — I120 Hypertensive chronic kidney disease with stage 5 chronic kidney disease or end stage renal disease: Secondary | ICD-10-CM | POA: Insufficient documentation

## 2017-04-16 DIAGNOSIS — N186 End stage renal disease: Secondary | ICD-10-CM | POA: Insufficient documentation

## 2017-04-16 DIAGNOSIS — M542 Cervicalgia: Secondary | ICD-10-CM | POA: Diagnosis present

## 2017-04-16 MED ORDER — FLUTICASONE PROPIONATE 50 MCG/ACT NA SUSP: 1.0000 | Freq: Two times a day (BID) | NASAL | 0 refills | Status: DC

## 2017-04-16 MED ORDER — MELOXICAM 15 MG PO TABS: 15.0000 mg | ORAL_TABLET | Freq: Every day | ORAL | 0 refills | Status: DC

## 2017-04-16 MED ORDER — KETOROLAC TROMETHAMINE 30 MG/ML IJ SOLN
30.0000 mg | Freq: Once | INTRAMUSCULAR | Status: AC
  Administered 2017-04-16: 30 mg via INTRAMUSCULAR
  Filled 2017-04-16: qty 1

## 2017-04-16 MED ORDER — METHOCARBAMOL 500 MG PO TABS: 500.0000 mg | ORAL_TABLET | Freq: Four times a day (QID) | ORAL | 0 refills | Status: DC

## 2017-04-16 NOTE — ED Triage Notes (Signed)
Pt arrived to the ED for complaints of neck pain. Pt reports that he has a history of it and today is worse. Pt is AOx4 in no apparent distress.

## 2017-04-16 NOTE — ED Provider Notes (Signed)
[facility] Emergency Department Provider Note  ____________________________________________  Time seen: Approximately 11:18 PM  I have reviewed the triage vital signs and the nursing notes.   HISTORY  Chief Complaint Neck Pain    HPI Louis [name] is a 57 y.o. male who presents the emergency department complaining of neck pain and stiffness.  Patient reports that he is been experiencing increasing neck pain and stiffness over the past several days.  No recent trauma to include motor vehicle collisions, falls, direct trauma to the neck.  Patient denies any headache, visual changes, chest pain, shortness of breath, abdominal pain, nausea vomiting.  Patient reports that he has had this in the past and required a muscle relaxer which alleviated all symptoms.  Patient is chronic kidney disease with end-stage renal failure and on dialysis.  Patient reports that he has not missed any of his dialysis treatments.  She is also requesting that his ears be checked as he had a recent otitis media.  Patient is finishing up his antibiotics and wants to "make sure the infection is gone."  Patient denies any pain, difficulty hearing, nasal congestion, sore throat, cough.  Patient with a history of chronic kidney disease, end-stage renal failure, gout, HIV, hypertension, neck muscle spasms.  Patient is reporting muscle spasms but denies any other complaint related to his chronic medical problems.  Patient has been placed on NSAID therapy by his nephrologist for both gout and muscle spasm of his neck in the past.  Past Medical History:  Diagnosis Date  . Chronic kidney disease    esrd  . Dermatophytosis of foot 06/23/2004  . Environmental allergies   . Gout   . HIV infection ([place]) 12/1995  . Hypertension   . Seborrhea 06/23/2004    Patient Active Problem List   Diagnosis Date Noted  . ESRD on dialysis ([place]) 12/19/2016  . AKI (acute kidney injury) ([place]) 08/22/2016  .  Baker's cyst of knee, right 05/17/2016  . Elevated serum creatinine 05/17/2016  . Otalgia of left ear 03/29/2016  . Neck muscle spasm 03/16/2016  . Erythematous rash 03/16/2016  . ERRONEOUS ENCOUNTER--DISREGARD 03/10/2016  . Gout 01/05/2015  . Renal insufficiency 11/07/2012  . Hypertension 06/23/2010  . Rosacea 06/23/2010  . Encounter for long-term (current) use of other medications 06/09/2010  . Screening examination for venereal disease 06/09/2010  . Encounter for preventive health examination   . Human immunodeficiency virus (HIV) disease ([place]) 01/02/1996    Past Surgical History:  Procedure Laterality Date  . CAPD INSERTION N/A 12/28/2016   Procedure: LAPAROSCOPIC INSERTION CONTINUOUS AMBULATORY PERITONEAL DIALYSIS  (CAPD) CATHETER;  Surgeon: Algernon Huxley, MD;  Location: [facility] ORS;  Service: Vascular;  Laterality: N/A;  . DIALYSIS/PERMA CATHETER INSERTION N/A 10/09/2016   Procedure: DIALYSIS/PERMA CATHETER INSERTION;  Surgeon: Algernon Huxley, MD;  Location: [place] [facility];  Service: Cardiovascular;  Laterality: N/A;  . DIALYSIS/PERMA CATHETER REMOVAL N/A 02/19/2017   Procedure: DIALYSIS/PERMA CATHETER REMOVAL;  Surgeon: Algernon Huxley, MD;  Location: [place] [facility];  Service: Cardiovascular;  Laterality: N/A;  . HERNIA REPAIR Bilateral    inguinal    Prior to Admission medications   Medication Sig Start Date End Date Taking? Authorizing Provider  amLODipine (NORVASC) 10 MG tablet Take 1 tablet (10 mg total) by mouth daily. 08/28/16   Dustin Flock, MD  atenolol (TENORMIN) 100 MG tablet Take 1 tablet (100 mg total) by mouth daily. 04/15/17   Steele Sizer, MD  cloNIDine (CATAPRES) 0.1 MG  tablet Take 1 tablet (0.1 mg total) by mouth 2 (two) times daily. Patient taking differently: Take 0.1 mg daily by mouth.  08/27/16   Dustin Flock, MD  colchicine 0.6 MG tablet Take 1 tablet (0.6 mg total) by mouth daily as needed. Patient taking differently: Take 0.6 mg by mouth daily as  needed (for gout flare ups.).  03/16/16   Roselee Nova, MD  Dermatological Products, [place]. (LEVICYN) GEL Apply 1 application topically 2 (two) times daily as needed (for itchy skin.).  12/13/16   [provider]  doxycycline (VIBRA-TABS) 100 MG tablet Take 100 mg by mouth 2 (two) times daily as needed (for skin rash).    [provider]  fluticasone (FLONASE) 50 MCG/ACT nasal spray Place 1 spray into both nostrils 2 (two) times daily. 04/16/17   Cuthriell, Charline Bills, PA-C  furosemide (LASIX) 40 MG tablet Take 40 mg by mouth daily.  12/06/16   [provider]  gentamicin cream (GARAMYCIN) 0.1 % Apply 1 application topically daily as needed. Itchy/irritated skin. 02/01/17   [provider]  HYDROcodone-acetaminophen (NORCO) 5-325 MG tablet Take 1 tablet every 6 (six) hours as needed by mouth for moderate pain. Patient not taking: Reported on 02/15/2017 12/28/16   Algernon Huxley, MD  hydrOXYzine (ATARAX/VISTARIL) 25 MG tablet Take 25 mg by mouth 2 (two) times daily as needed for itching.  12/07/16   [provider]  ibuprofen (ADVIL,MOTRIN) 200 MG tablet Take 400 mg by mouth every 8 (eight) hours as needed (for pain.).    [provider]  loratadine (CLARITIN) 10 MG tablet Take 10 mg by mouth daily.    [provider]  meloxicam (MOBIC) 15 MG tablet Take 1 tablet (15 mg total) by mouth daily. 04/16/17   Cuthriell, Charline Bills, PA-C  methocarbamol (ROBAXIN) 500 MG tablet Take 1 tablet (500 mg total) by mouth 4 (four) times daily. 04/16/17   Cuthriell, Charline Bills, PA-C  predniSONE (DELTASONE) 20 MG tablet Take 2 tablets (40 mg total) by mouth daily with breakfast. Patient not taking: Reported on 12/21/2016 10/12/16   Vaughan Basta, MD  valACYclovir (VALTREX) 500 MG tablet Take 500 mg by mouth daily as needed (for flare ups).  12/06/16   [provider]  zolpidem (AMBIEN) 5 MG tablet Take 1 tablet (5 mg total) by mouth at  bedtime. Patient taking differently: Take 5 mg by mouth at bedtime as needed (for sleep.).  08/27/16   Dustin Flock, MD    Allergies Quinapril hcl  Family History  Problem Relation Age of Onset  . Bradycardia Maternal Grandmother        with pacemaker    Social History Social History   Tobacco Use  . Smoking status: Never Smoker  . Smokeless tobacco: Never Used  Substance Use Topics  . Alcohol use: Yes    Alcohol/week: 1.2 oz    Types: 2 Standard drinks or equivalent per week    Comment: Just on the weekend  . Drug use: No     Review of Systems  Constitutional: No fever/chills Eyes: No visual changes. No discharge ENT: No upper respiratory complaints.  Patient requesting ears checked as he has had recent otitis media infection. Cardiovascular: no chest pain. Respiratory: no cough. No SOB. Gastrointestinal: No abdominal pain.  No nausea, no vomiting.  No diarrhea.  No constipation. Genitourinary: Negative for dysuria. No hematuria Musculoskeletal: Positive for neck pain and stiffness.  No radicular symptoms. Skin: Negative for rash, abrasions, lacerations, ecchymosis.  Neurological: Negative for headaches, focal weakness or numbness. 10-point ROS otherwise negative.  ____________________________________________   PHYSICAL EXAM:  VITAL SIGNS: ED Triage Vitals  Enc Vitals Group     BP 04/16/17 2200 (!) 168/109     Pulse Rate 04/16/17 2200 63     Resp 04/16/17 2200 20     Temp 04/16/17 2200 98.1 F (36.7 C)     Temp Source 04/16/17 2200 Oral     SpO2 04/16/17 2200 100 %     Weight 04/16/17 2201 201 lb (91.2 kg)     Height 04/16/17 2201 6\' 2"  (1.88 m)     Head Circumference --      Peak Flow --      Pain Score 04/16/17 2200 8     Pain Loc --      Pain Edu? --      Excl. in [place]? --      Constitutional: Alert and oriented. Well appearing and in no acute distress. Eyes: Conjunctivae are normal. PERRL. EOMI. Head: Atraumatic. ENT:      Ears: EACs with  cerumen bilaterally.  TMs are visualized with no injection, air-fluid level.  TM on left is mildly bulging.      Nose: No congestion/rhinnorhea.      Mouth/Throat: Mucous membranes are moist.  Oropharynx is nonerythematous and nonedematous.  Uvula is midline. Neck: No stridor.  No midline cervical spine tenderness to palpation.  Diffuse tenderness bilateral paraspinal muscle groups of spasms appreciated bilaterally.  Negative Kernig's and Brudzinski's.  Radial pulse intact bilateral upper extremity.  Sensation intact and equal in all dermatomal distributions bilateral upper extremities. Hematological/Lymphatic/Immunilogical: No cervical lymphadenopathy. Cardiovascular: Normal rate, regular rhythm. Normal S1 and S2.  Good peripheral circulation. Respiratory: Normal respiratory effort without tachypnea or retractions. Lungs CTAB. Good air entry to the bases with no decreased or absent breath sounds. Musculoskeletal: Full range of motion to all extremities. No gross deformities appreciated. Neurologic:  Normal speech and language. No gross focal neurologic deficits are appreciated.  Skin:  Skin is warm, dry and intact. No rash noted. Psychiatric: Mood and affect are normal. Speech and behavior are normal. Patient exhibits appropriate insight and judgement.   ____________________________________________   LABS (all labs ordered are listed, but only abnormal results are displayed)  Labs Reviewed - No data to display ____________________________________________  EKG   ____________________________________________  RADIOLOGY   No results found.  ____________________________________________    PROCEDURES  Procedure(s) performed:    Procedures    Medications  ketorolac (TORADOL) 30 MG/ML injection 30 mg (30 mg Intramuscular Given 04/16/17 2322)     ____________________________________________   INITIAL IMPRESSION / ASSESSMENT AND PLAN / ED COURSE  Pertinent labs & imaging  results that were available during my care of the patient were reviewed by me and considered in my medical decision making (see chart for details).  Review of the [place] CSRS was performed in accordance of the [place] prior to dispensing any controlled drugs.     Patient's diagnosis is consistent with torticollis and eustachian tube dysfunction.  Patient presents with a complaint of neck pain and stiffness.  Patient has a history of same.  He has been on anti-inflammatories and muscle relaxers in the past.  Patient is in end-stage renal disease and on dialysis.  He reports that his nephrologist has placed him on NSAID therapy for both his neck as well as gout flare.  At this time, with this history, I will place patient on meloxicam and a muscle  relaxer for his torticollis.  No indication of meningitis.  No indication at this time for concern for cervical spine fracture warranting imaging.  Patient with improved appearance of otitis media, I do suspect mild eustachian tube dysfunction.. Patient will be discharged home with prescriptions for meloxicam and Robaxin for torticollis and Flonase for eustachian tube dysfunction. Patient is to follow up with [facility] and or nephrologist as needed or otherwise directed. Patient is given ED precautions to return to the ED for any worsening or new symptoms.     ____________________________________________  FINAL CLINICAL IMPRESSION(S) / ED DIAGNOSES  Final diagnoses:  Torticollis, acute  Dysfunction of both eustachian tubes      NEW MEDICATIONS STARTED DURING THIS VISIT:  ED Discharge Orders        Ordered    meloxicam (MOBIC) 15 MG tablet  Daily     04/16/17 2311    methocarbamol (ROBAXIN) 500 MG tablet  4 times daily     04/16/17 2311    fluticasone (FLONASE) 50 MCG/ACT nasal spray  2 times daily     04/16/17 2311          This chart was dictated using voice recognition software/Dragon. Despite best efforts to proofread, errors can occur  which can change the meaning. Any change was purely unintentional.    Darletta Moll, PA-C 04/16/17 2330    Nance Pear, MD 04/16/17 [phone]

## 2017-04-17 ENCOUNTER — Other Ambulatory Visit: Payer: Self-pay | Admitting: Family Medicine

## 2017-04-17 DIAGNOSIS — I10 Essential (primary) hypertension: Secondary | ICD-10-CM

## 2017-04-17 NOTE — Telephone Encounter (Signed)
Refill request for Hypertension medication:  Atenolol 100 mg  Last office visit pertaining to hypertension: 05/11/2016  BP Readings from Last 3 Encounters:  04/16/17 (!) 168/109  03/31/17 (!) 145/83  02/19/17 (!) 138/92     Lab Results  Component Value Date   CREATININE 10.79 (H) 01/02/2017   BUN 57 (H) 01/02/2017   NA 136 01/02/2017   K 3.4 (L) 01/02/2017   CL 97 (L) 01/02/2017   CO2 26 01/02/2017    Follow-up on file. 04/18/2017

## 2017-04-18 ENCOUNTER — Ambulatory Visit: Payer: Self-pay | Admitting: Family Medicine

## 2017-04-28 ENCOUNTER — Encounter: Payer: Self-pay | Admitting: Emergency Medicine

## 2017-04-28 ENCOUNTER — Emergency Department
Admission: EM | Admit: 2017-04-28 | Discharge: 2017-04-29 | Disposition: A | Discharge status: Home / Self Care | Payer: PRIVATE HEALTH INSURANCE | Attending: Emergency Medicine | Admitting: Emergency Medicine

## 2017-04-28 ENCOUNTER — Other Ambulatory Visit: Payer: Self-pay

## 2017-04-28 DIAGNOSIS — Z79899 Other long term (current) drug therapy: Secondary | ICD-10-CM | POA: Diagnosis not present

## 2017-04-28 DIAGNOSIS — N186 End stage renal disease: Secondary | ICD-10-CM | POA: Diagnosis not present

## 2017-04-28 DIAGNOSIS — I120 Hypertensive chronic kidney disease with stage 5 chronic kidney disease or end stage renal disease: Secondary | ICD-10-CM | POA: Diagnosis not present

## 2017-04-28 DIAGNOSIS — R2243 Localized swelling, mass and lump, lower limb, bilateral: Secondary | ICD-10-CM | POA: Insufficient documentation

## 2017-04-28 DIAGNOSIS — Z992 Dependence on renal dialysis: Secondary | ICD-10-CM | POA: Diagnosis not present

## 2017-04-28 NOTE — ED Triage Notes (Signed)
Patient ambulatory to triage with complaints of bilateral lower leg swelling that started on 11 march, pt started taking"ageless male" on 8-10 March.  Pt states the swelling is worse after standing at work at week and then resolves some after sleeping at night.  Pt reports one of the noted side effects on the bottle is ankle and feet swelling.  Pt denies pain, reports doing peritoneal dialysis at home and takes meds for HTN, denies CHF Speaking in complete coherent sentences. No acute breathing distress noted.

## 2017-04-29 ENCOUNTER — Emergency Department
Admission: EM | Admit: 2017-04-29 | Discharge: 2017-04-29 | Disposition: A | Discharge status: Home / Self Care | Payer: PRIVATE HEALTH INSURANCE | Attending: Emergency Medicine | Admitting: Emergency Medicine

## 2017-04-29 ENCOUNTER — Encounter: Payer: Self-pay | Admitting: Emergency Medicine

## 2017-04-29 ENCOUNTER — Other Ambulatory Visit: Payer: Self-pay

## 2017-04-29 ENCOUNTER — Ambulatory Visit: Payer: PRIVATE HEALTH INSURANCE

## 2017-04-29 DIAGNOSIS — Z79899 Other long term (current) drug therapy: Secondary | ICD-10-CM | POA: Diagnosis not present

## 2017-04-29 DIAGNOSIS — Z992 Dependence on renal dialysis: Secondary | ICD-10-CM | POA: Insufficient documentation

## 2017-04-29 DIAGNOSIS — I120 Hypertensive chronic kidney disease with stage 5 chronic kidney disease or end stage renal disease: Secondary | ICD-10-CM | POA: Diagnosis not present

## 2017-04-29 DIAGNOSIS — Z21 Asymptomatic human immunodeficiency virus [HIV] infection status: Secondary | ICD-10-CM | POA: Diagnosis not present

## 2017-04-29 DIAGNOSIS — R600 Localized edema: Secondary | ICD-10-CM | POA: Insufficient documentation

## 2017-04-29 DIAGNOSIS — N186 End stage renal disease: Secondary | ICD-10-CM | POA: Insufficient documentation

## 2017-04-29 LAB — CBC
HCT: 29.4 % — ABNORMAL LOW (ref 40.0–52.0)
Hemoglobin: 10.1 g/dL — ABNORMAL LOW (ref 13.0–18.0)
MCH: 35.4 pg — ABNORMAL HIGH (ref 26.0–34.0)
MCHC: 34.4 g/dL (ref 32.0–36.0)
MCV: 103 fL — ABNORMAL HIGH (ref 80.0–100.0)
Platelets: 221 10*3/uL (ref 150–440)
RBC: 2.86 MIL/uL — ABNORMAL LOW (ref 4.40–5.90)
RDW: 20 % — ABNORMAL HIGH (ref 11.5–14.5)
WBC: 5.2 10*3/uL (ref 3.8–10.6)

## 2017-04-29 LAB — BASIC METABOLIC PANEL
Anion gap: 11 (ref 5–15)
BUN: 75 mg/dL — ABNORMAL HIGH (ref 6–20)
CO2: 23 mmol/L (ref 22–32)
Calcium: 8.4 mg/dL — ABNORMAL LOW (ref 8.9–10.3)
Chloride: 106 mmol/L (ref 101–111)
Creatinine, Ser: 10.62 mg/dL — ABNORMAL HIGH (ref 0.61–1.24)
GFR calc Af Amer: 6 mL/min — ABNORMAL LOW (ref >60)
GFR calc non Af Amer: 5 mL/min — ABNORMAL LOW (ref >60)
Glucose, Bld: 92 mg/dL (ref 65–99)
Potassium: 3.8 mmol/L (ref 3.5–5.1)
Sodium: 140 mmol/L (ref 135–145)

## 2017-04-29 LAB — TROPONIN I: Troponin I: <0.03 ng/mL (ref <0.03)

## 2017-04-29 NOTE — ED Notes (Addendum)
Pt called for room x3 in lobby. Pt last seen ambulatory with steady gait and in NAD.

## 2017-04-29 NOTE — ED Triage Notes (Signed)
Pt arrived via POV from home with reports of leg swelling since Sunday, states the swelling is getting worse and cannot put his shoes on.  Pt is a [employer] pt. Last treatment was on Friday night. Pt denies any hx of HF and denies shortness of breath. Pt has pitting edema up through calves bilaterally.  Denies any scrotal swelling.  Pt took energy supplements recently which has a side effect of swelling. Pt states he took a few pills per day for the past few days.

## 2017-04-29 NOTE — ED Provider Notes (Signed)
[facility] Emergency Department Provider Note   ____________________________________________    I have reviewed the triage vital signs and the nursing notes.   HISTORY  Chief Complaint Leg Swelling     HPI Louis [name] is a 57 y.o. male with a history of HIV and chronic kidney disease who performs peritoneal dialysis who reports 1 week of bilateral lower extremity swelling.  Patient reports it has become so significant that he has difficulty getting his shoes on.  He denies shortness of breath.  No chest pain.  No abdominal or scrotal swelling.  No history of the same.  Does make urine.  Does PD 4 times a week, nephrologist is Dr. Holley Raring.  No fevers or chills.  No cough.  Has not taken anything for this   Past Medical History:  Diagnosis Date  . Chronic kidney disease    esrd  . Dermatophytosis of foot 06/23/2004  . Environmental allergies   . Gout   . HIV infection ([place]) 12/1995  . Hypertension   . Seborrhea 06/23/2004    Patient Active Problem List   Diagnosis Date Noted  . ESRD on dialysis ([place]) 12/19/2016  . AKI (acute kidney injury) ([place]) 08/22/2016  . Baker's cyst of knee, right 05/17/2016  . Elevated serum creatinine 05/17/2016  . Otalgia of left ear 03/29/2016  . Neck muscle spasm 03/16/2016  . Erythematous rash 03/16/2016  . ERRONEOUS ENCOUNTER--DISREGARD 03/10/2016  . Gout 01/05/2015  . Renal insufficiency 11/07/2012  . Hypertension 06/23/2010  . Rosacea 06/23/2010  . Encounter for long-term (current) use of other medications 06/09/2010  . Screening examination for venereal disease 06/09/2010  . Encounter for preventive health examination   . Human immunodeficiency virus (HIV) disease ([place]) 01/02/1996    Past Surgical History:  Procedure Laterality Date  . CAPD INSERTION N/A 12/28/2016   Procedure: LAPAROSCOPIC INSERTION CONTINUOUS AMBULATORY PERITONEAL DIALYSIS  (CAPD) CATHETER;  Surgeon: Algernon Huxley, MD;  Location:  [facility] ORS;  Service: Vascular;  Laterality: N/A;  . DIALYSIS/PERMA CATHETER INSERTION N/A 10/09/2016   Procedure: DIALYSIS/PERMA CATHETER INSERTION;  Surgeon: Algernon Huxley, MD;  Location: [place] [facility];  Service: Cardiovascular;  Laterality: N/A;  . DIALYSIS/PERMA CATHETER REMOVAL N/A 02/19/2017   Procedure: DIALYSIS/PERMA CATHETER REMOVAL;  Surgeon: Algernon Huxley, MD;  Location: [place] [facility];  Service: Cardiovascular;  Laterality: N/A;  . HERNIA REPAIR Bilateral    inguinal    Prior to Admission medications   Medication Sig Start Date End Date Taking? Authorizing Provider  amLODipine (NORVASC) 10 MG tablet Take 1 tablet (10 mg total) by mouth daily. 08/28/16   Dustin Flock, MD  atenolol (TENORMIN) 100 MG tablet Take 1 tablet (100 mg total) by mouth daily. 04/15/17   Steele Sizer, MD  AURYXIA 1 GM 210 MG(Fe) tablet Take 420 mg by mouth 2 (two) times daily. 04/09/17   [provider]  cloNIDine (CATAPRES) 0.1 MG tablet Take 1 tablet (0.1 mg total) by mouth 2 (two) times daily. Patient taking differently: Take 0.1 mg daily by mouth.  08/27/16   Dustin Flock, MD  colchicine 0.6 MG tablet Take 1 tablet (0.6 mg total) by mouth daily as needed. Patient taking differently: Take 0.6 mg by mouth daily as needed (for gout flare ups.).  03/16/16   Roselee Nova, MD  Dermatological Products, [place]. (LEVICYN) GEL Apply 1 application topically 2 (two) times daily as needed (for itchy skin.).  12/13/16   [provider]  DESCOVY 200-25 MG tablet Take 1 tablet by mouth daily. 04/22/17   [provider]  doxycycline (VIBRA-TABS) 100 MG tablet Take 100 mg by mouth 2 (two) times daily as needed (for skin rash).    [provider]  doxycycline (VIBRAMYCIN) 100 MG capsule Take 1 capsule by mouth 2 (two) times daily. 03/01/17   [provider]  fluticasone (FLONASE) 50 MCG/ACT nasal spray Place 1 spray into both nostrils 2 (two) times daily. 04/16/17    Cuthriell, Charline Bills, PA-C  furosemide (LASIX) 40 MG tablet Take 40 mg by mouth daily.  12/06/16   [provider]  gentamicin cream (GARAMYCIN) 0.1 % Apply 1 application topically daily as needed. Itchy/irritated skin. 02/01/17   [provider]  HYDROcodone-acetaminophen (NORCO) 5-325 MG tablet Take 1 tablet every 6 (six) hours as needed by mouth for moderate pain. Patient not taking: Reported on 02/15/2017 12/28/16   Algernon Huxley, MD  hydrOXYzine (ATARAX/VISTARIL) 25 MG tablet Take 25 mg by mouth 2 (two) times daily as needed for itching.  12/07/16   [provider]  ibuprofen (ADVIL,MOTRIN) 200 MG tablet Take 400 mg by mouth every 8 (eight) hours as needed (for pain.).    [provider]  loratadine (CLARITIN) 10 MG tablet Take 10 mg by mouth daily.    [provider]  meloxicam (MOBIC) 15 MG tablet Take 1 tablet (15 mg total) by mouth daily. 04/16/17   Cuthriell, Charline Bills, PA-C  methocarbamol (ROBAXIN) 500 MG tablet Take 1 tablet (500 mg total) by mouth 4 (four) times daily. 04/16/17   Cuthriell, Charline Bills, PA-C  predniSONE (DELTASONE) 20 MG tablet Take 2 tablets (40 mg total) by mouth daily with breakfast. Patient not taking: Reported on 12/21/2016 10/12/16   Vaughan Basta, MD  TIVICAY 50 MG tablet Take 1 tablet by mouth daily. 04/22/17   [provider]  valACYclovir (VALTREX) 500 MG tablet Take 500 mg by mouth daily as needed (for flare ups).  12/06/16   [provider]  zolpidem (AMBIEN) 5 MG tablet Take 1 tablet (5 mg total) by mouth at bedtime. Patient taking differently: Take 5 mg by mouth at bedtime as needed (for sleep.).  08/27/16   Dustin Flock, MD     Allergies Quinapril hcl  Family History  Problem Relation Age of Onset  . Bradycardia Maternal Grandmother        with pacemaker    Social History Social History   Tobacco Use  . Smoking status: Never Smoker  . Smokeless tobacco: Never Used  Substance  Use Topics  . Alcohol use: Yes    Alcohol/week: 1.2 oz    Types: 2 Standard drinks or equivalent per week    Comment: Just on the weekend  . Drug use: No    Review of Systems  Constitutional: No fevers Eyes: No visual changes.  ENT: No neck pain Cardiovascular: Denies chest pain. Respiratory: Denies shortness of breath.  No PND Gastrointestinal: No nausea, no vomiting.   Genitourinary: No scrotal swelling Musculoskeletal: Lower extremity edema as above Skin: Negative for rash. Neurological: Negative for headaches   ____________________________________________   PHYSICAL EXAM:  VITAL SIGNS: ED Triage Vitals  Enc Vitals Group     BP 04/29/17 0731 (!) 185/100     Pulse Rate 04/29/17 0731 65     Resp 04/29/17 0731 18     Temp 04/29/17 0731 98.5 F (36.9 C)     Temp Source 04/29/17 0731 Oral  SpO2 04/29/17 0731 98 %     Weight 04/29/17 0732 90.7 kg (200 lb)     Height 04/29/17 0732 1.88 m (6\' 2" )     Head Circumference --      Peak Flow --      Pain Score 04/29/17 0749 0     Pain Loc --      Pain Edu? --      Excl. in [place]? --     Constitutional: Alert and oriented. No acute distress. Pleasant and interactive Eyes: Conjunctivae are normal.   Nose: No congestion/rhinnorhea. Mouth/Throat: Mucous membranes are moist.    Cardiovascular: Normal rate, regular rhythm. Grossly normal heart sounds.  Good peripheral circulation. Respiratory: Normal respiratory effort.  No retractions. Lungs CTAB. Gastrointestinal: Soft and nontender. No distention.  No CVA tenderness.  Musculoskeletal: 2+ edema bilaterally up to approximately level of the knee, warm and well perfused Neurologic:  Normal speech and language. No gross focal neurologic deficits are appreciated.  Skin:  Skin is warm, dry and intact. No rash noted. Psychiatric: Mood and affect are normal. Speech and behavior are normal.  ____________________________________________   LABS (all labs ordered are listed,  but only abnormal results are displayed)  Labs Reviewed  BASIC METABOLIC PANEL - Abnormal; Notable for the following components:      Result Value   BUN 75 (*)    Creatinine, Ser 10.62 (*)    Calcium 8.4 (*)    GFR calc non Af Amer 5 (*)    GFR calc Af Amer 6 (*)    All other components within normal limits  CBC - Abnormal; Notable for the following components:   RBC 2.86 (*)    Hemoglobin 10.1 (*)    HCT 29.4 (*)    MCV 103.0 (*)    MCH 35.4 (*)    RDW 20.0 (*)    All other components within normal limits  TROPONIN I   ____________________________________________  EKG  None ____________________________________________  RADIOLOGY  None ____________________________________________   PROCEDURES  Procedure(s) performed: No  Procedures   Critical Care performed: No ____________________________________________   INITIAL IMPRESSION / ASSESSMENT AND PLAN / ED COURSE  Pertinent labs & imaging results that were available during my care of the patient were reviewed by me and considered in my medical decision making (see chart for details).  Patient presents with bilateral lower extremity edema which has accumulated over the last week.  Does make urine.  No shortness of breath, normal lung exam.  Labs overall unremarkable, will discuss with nephrology  Dr. Abigail Butts recommends increasing lasix to 80 mg/day and increasing dextrose solution for PD and to call PD nurse and follow up with Dr. Holley Raring. D/w patient who agrees with plan. Has lasix at home doesn't need rx    ____________________________________________   FINAL CLINICAL IMPRESSION(S) / ED DIAGNOSES  Final diagnoses:  Leg edema        Note:  This document was prepared using Dragon voice recognition software and may include unintentional dictation errors.    Lavonia Drafts, MD 04/29/17 [phone]

## 2017-05-18 ENCOUNTER — Telehealth (INDEPENDENT_AMBULATORY_CARE_PROVIDER_SITE_OTHER): Payer: Self-pay

## 2017-05-18 NOTE — Telephone Encounter (Signed)
Anderson Malta from [facility] called stating that the patient needs a Perm cath insertion and a PD cath revision.  I asked if Mickel Baas had gotten any orders, but she stated that she had not and that the patients needed to be seen on office before those procedures could be scheduled.  The order has now come over from [place]

## 2017-05-21 ENCOUNTER — Other Ambulatory Visit (INDEPENDENT_AMBULATORY_CARE_PROVIDER_SITE_OTHER): Payer: Self-pay | Admitting: Vascular Surgery

## 2017-05-22 MED ORDER — CEFAZOLIN SODIUM-DEXTROSE 1-4 GM/50ML-% IV SOLN: 1.0000 g | Freq: Once | INTRAVENOUS | Status: DC

## 2017-05-23 ENCOUNTER — Ambulatory Visit
Admission: RE | Admit: 2017-05-23 | Payer: PRIVATE HEALTH INSURANCE | Source: Ambulatory Visit | Admitting: Vascular Surgery

## 2017-05-23 ENCOUNTER — Encounter: Admission: RE | Payer: Self-pay | Source: Ambulatory Visit

## 2017-05-23 SURGERY — DIALYSIS/PERMA CATHETER INSERTION
Anesthesia: Moderate Sedation

## 2017-05-24 ENCOUNTER — Encounter
Admit: 2017-05-24 | Discharge: 2017-05-27 | Disposition: A | Discharge status: Home / Self Care | Payer: MEDICARE | Attending: Student in an Organized Health Care Education/Training Program | Admitting: Nephrology

## 2017-05-24 ENCOUNTER — Ambulatory Visit
Admit: 2017-05-24 | Discharge: 2017-05-27 | Disposition: A | Discharge status: Home / Self Care | Payer: MEDICARE | Admitting: Nephrology

## 2017-05-24 DIAGNOSIS — Z862 Personal history of diseases of the blood and blood-forming organs and certain disorders involving the immune mechanism: Secondary | ICD-10-CM | POA: Insufficient documentation

## 2017-05-24 DIAGNOSIS — N189 Chronic kidney disease, unspecified: Secondary | ICD-10-CM | POA: Insufficient documentation

## 2017-05-24 DIAGNOSIS — T85611A Breakdown (mechanical) of intraperitoneal dialysis catheter, initial encounter: Principal | ICD-10-CM

## 2017-05-25 DIAGNOSIS — T85611A Breakdown (mechanical) of intraperitoneal dialysis catheter, initial encounter: Principal | ICD-10-CM

## 2017-05-27 MED ORDER — GENERIC EXTERNAL MEDICATION
Status: DC
Start: ? — End: 2017-05-27

## 2017-05-27 MED ORDER — FUROSEMIDE 80 MG PO TABS
80.00 | ORAL_TABLET | ORAL | Status: DC
Start: 2017-05-28 — End: 2017-05-27

## 2017-05-27 MED ORDER — CLONIDINE HCL 0.1 MG PO TABS
0.10 | ORAL_TABLET | ORAL | Status: DC
Start: 2017-05-27 — End: 2017-05-27

## 2017-05-27 MED ORDER — COLCHICINE 0.6 MG PO TABS
0.60 | ORAL_TABLET | ORAL | Status: DC
Start: 2017-05-28 — End: 2017-05-27

## 2017-05-27 MED ORDER — FLUTICASONE PROPIONATE 50 MCG/ACT NA SUSP
1.00 | NASAL | Status: DC
Start: 2017-05-27 — End: 2017-05-27

## 2017-05-27 MED ORDER — FERROUS SULFATE 325 (65 FE) MG PO TABS
325.00 | ORAL_TABLET | ORAL | Status: DC
Start: 2017-05-28 — End: 2017-05-27

## 2017-05-27 MED ORDER — SEVELAMER CARBONATE 800 MG PO TABS
1600.00 | ORAL_TABLET | ORAL | Status: DC
Start: 2017-05-27 — End: 2017-05-27

## 2017-05-27 MED ORDER — ATENOLOL 100 MG PO TABS
100.00 | ORAL_TABLET | ORAL | Status: DC
Start: 2017-05-28 — End: 2017-05-27

## 2017-05-27 MED ORDER — FEBUXOSTAT 40 MG PO TABS
40.00 | ORAL_TABLET | ORAL | Status: DC
Start: 2017-05-28 — End: 2017-05-27

## 2017-05-27 MED ORDER — ONDANSETRON 4 MG PO TBDP
4.00 | ORAL_TABLET | ORAL | Status: DC
Start: ? — End: 2017-05-27

## 2017-05-27 MED ORDER — HEPARIN SODIUM (PORCINE) 5000 UNIT/ML IJ SOLN
5000.00 | INTRAMUSCULAR | Status: DC
Start: 2017-05-27 — End: 2017-05-27

## 2017-05-27 MED ORDER — AMLODIPINE BESYLATE 10 MG PO TABS
10.00 | ORAL_TABLET | ORAL | Status: DC
Start: 2017-05-28 — End: 2017-05-27

## 2017-05-27 MED ORDER — CLONIDINE HCL 0.1 MG TABLET: 0 mg | tablet | Freq: Two times a day (BID) | 0 refills | 0 days | Status: SS

## 2017-05-27 MED ORDER — CLONIDINE HCL 0.1 MG TABLET
ORAL_TABLET | Freq: Two times a day (BID) | ORAL | 0 refills | 0.00000 days | Status: SS
Start: 2017-05-27 — End: 2018-01-29

## 2017-05-27 MED ORDER — SEVELAMER CARBONATE 800 MG TABLET
ORAL_TABLET | Freq: Three times a day (TID) | ORAL | 11 refills | 0.00000 days | Status: CP
Start: 2017-05-27 — End: 2018-01-30

## 2017-05-29 ENCOUNTER — Encounter: Admit: 2017-05-29 | Discharge: 2017-05-29 | Disposition: A | Discharge status: Home / Self Care | Payer: MEDICARE

## 2017-05-29 DIAGNOSIS — M62830 Muscle spasm of back: Principal | ICD-10-CM

## 2017-05-29 MED ORDER — MELOXICAM 15 MG TABLET
ORAL_TABLET | Freq: Every day | ORAL | 0 refills | 0 days | Status: CP
Start: 2017-05-29 — End: 2017-06-12

## 2017-05-29 MED ORDER — METHOCARBAMOL 500 MG TABLET
ORAL_TABLET | Freq: Three times a day (TID) | ORAL | 0 refills | 0 days | Status: CP | PRN
Start: 2017-05-29 — End: 2017-06-05

## 2017-06-01 ENCOUNTER — Emergency Department
Admission: EM | Admit: 2017-06-01 | Discharge: 2017-06-01 | Disposition: A | Discharge status: Home / Self Care | Payer: PRIVATE HEALTH INSURANCE | Attending: Emergency Medicine | Admitting: Emergency Medicine

## 2017-06-01 ENCOUNTER — Other Ambulatory Visit: Payer: Self-pay

## 2017-06-01 DIAGNOSIS — M2550 Pain in unspecified joint: Secondary | ICD-10-CM | POA: Diagnosis not present

## 2017-06-01 DIAGNOSIS — N186 End stage renal disease: Secondary | ICD-10-CM | POA: Diagnosis not present

## 2017-06-01 DIAGNOSIS — Z992 Dependence on renal dialysis: Secondary | ICD-10-CM | POA: Insufficient documentation

## 2017-06-01 DIAGNOSIS — B20 Human immunodeficiency virus [HIV] disease: Secondary | ICD-10-CM | POA: Insufficient documentation

## 2017-06-01 DIAGNOSIS — Z79899 Other long term (current) drug therapy: Secondary | ICD-10-CM | POA: Diagnosis not present

## 2017-06-01 DIAGNOSIS — I120 Hypertensive chronic kidney disease with stage 5 chronic kidney disease or end stage renal disease: Secondary | ICD-10-CM | POA: Diagnosis not present

## 2017-06-01 LAB — COMPREHENSIVE METABOLIC PANEL
ALT: 7 U/L — ABNORMAL LOW (ref 17–63)
AST: 15 U/L (ref 15–41)
Albumin: 3.2 g/dL — ABNORMAL LOW (ref 3.5–5.0)
Alkaline Phosphatase: 48 U/L (ref 38–126)
Anion gap: 13 (ref 5–15)
BUN: 98 mg/dL — ABNORMAL HIGH (ref 6–20)
CO2: 22 mmol/L (ref 22–32)
Calcium: 8.8 mg/dL — ABNORMAL LOW (ref 8.9–10.3)
Chloride: 101 mmol/L (ref 101–111)
Creatinine, Ser: 13.43 mg/dL — ABNORMAL HIGH (ref 0.61–1.24)
GFR calc Af Amer: 4 mL/min — ABNORMAL LOW (ref >60)
GFR calc non Af Amer: 4 mL/min — ABNORMAL LOW (ref >60)
Glucose, Bld: 118 mg/dL — ABNORMAL HIGH (ref 65–99)
Potassium: 3.8 mmol/L (ref 3.5–5.1)
Sodium: 136 mmol/L (ref 135–145)
Total Bilirubin: 0.5 mg/dL (ref 0.3–1.2)
Total Protein: 7.6 g/dL (ref 6.5–8.1)

## 2017-06-01 LAB — URIC ACID: Uric Acid, Serum: 6.4 mg/dL (ref 4.4–7.6)

## 2017-06-01 LAB — CBC
HCT: 28.3 % — ABNORMAL LOW (ref 40.0–52.0)
Hemoglobin: 9.8 g/dL — ABNORMAL LOW (ref 13.0–18.0)
MCH: 34.7 pg — ABNORMAL HIGH (ref 26.0–34.0)
MCHC: 34.5 g/dL (ref 32.0–36.0)
MCV: 100.4 fL — ABNORMAL HIGH (ref 80.0–100.0)
Platelets: 277 10*3/uL (ref 150–440)
RBC: 2.82 MIL/uL — ABNORMAL LOW (ref 4.40–5.90)
RDW: 17.7 % — ABNORMAL HIGH (ref 11.5–14.5)
WBC: 6.6 10*3/uL (ref 3.8–10.6)

## 2017-06-01 MED ORDER — PREDNISONE 20 MG PO TABS
40.0000 mg | ORAL_TABLET | Freq: Once | ORAL | Status: AC
  Administered 2017-06-01: 40 mg via ORAL
  Filled 2017-06-01: qty 2

## 2017-06-01 MED ORDER — PREDNISONE 20 MG PO TABS: 40.0000 mg | ORAL_TABLET | Freq: Every day | ORAL | 0 refills | Status: AC

## 2017-06-01 NOTE — ED Notes (Signed)
Per [name] they will add on uric acid to blood already in lab

## 2017-06-01 NOTE — ED Triage Notes (Signed)
Pt states that he is having increased joint pain over the past week since he had his peritoneal dialysis port replaced, pt has pain in his wrists, knees, back, and neck

## 2017-06-01 NOTE — ED Provider Notes (Signed)
[facility] Emergency Department Provider Note ____________________________________________   First MD Initiated Contact with Patient 06/01/17 1158     (approximate)  I have reviewed the triage vital signs and the nursing notes.   HISTORY  Chief Complaint Joint Pain    HPI Louis [name] is a 57 y.o. male with PMH as noted below including ESRD on dialysis who presents with joint pain to upper and lower extremities bilaterally over the last week, occurring ever since he had his dialysis port replaced.  Patient states he has had similar episodes in the past but this 1 is slightly worse.  He denies any swelling.  It is worse when he puts pressure on his joints.  He has been taking meloxicam and Robaxin with some relief.   Past Medical History:  Diagnosis Date  . Chronic kidney disease    esrd  . Dermatophytosis of foot 06/23/2004  . Environmental allergies   . Gout   . HIV infection ([place]) 12/1995  . Hypertension   . Seborrhea 06/23/2004    Patient Active Problem List   Diagnosis Date Noted  . ESRD on dialysis ([place]) 12/19/2016  . AKI (acute kidney injury) ([place]) 08/22/2016  . Baker's cyst of knee, right 05/17/2016  . Elevated serum creatinine 05/17/2016  . Otalgia of left ear 03/29/2016  . Neck muscle spasm 03/16/2016  . Erythematous rash 03/16/2016  . ERRONEOUS ENCOUNTER--DISREGARD 03/10/2016  . Gout 01/05/2015  . Renal insufficiency 11/07/2012  . Hypertension 06/23/2010  . Rosacea 06/23/2010  . Encounter for long-term (current) use of other medications 06/09/2010  . Screening examination for venereal disease 06/09/2010  . Encounter for preventive health examination   . Human immunodeficiency virus (HIV) disease ([place]) 01/02/1996    Past Surgical History:  Procedure Laterality Date  . CAPD INSERTION N/A 12/28/2016   Procedure: LAPAROSCOPIC INSERTION CONTINUOUS AMBULATORY PERITONEAL DIALYSIS  (CAPD) CATHETER;  Surgeon: Algernon Huxley, MD;   Location: [facility] ORS;  Service: Vascular;  Laterality: N/A;  . DIALYSIS/PERMA CATHETER INSERTION N/A 10/09/2016   Procedure: DIALYSIS/PERMA CATHETER INSERTION;  Surgeon: Algernon Huxley, MD;  Location: [place] CV LAB;  Service: Cardiovascular;  Laterality: N/A;  . DIALYSIS/PERMA CATHETER REMOVAL N/A 02/19/2017   Procedure: DIALYSIS/PERMA CATHETER REMOVAL;  Surgeon: Algernon Huxley, MD;  Location: [place] CV LAB;  Service: Cardiovascular;  Laterality: N/A;  . HERNIA REPAIR Bilateral    inguinal    Prior to Admission medications   Medication Sig Start Date End Date Taking? Authorizing Provider  amLODipine (NORVASC) 10 MG tablet Take 1 tablet (10 mg total) by mouth daily. 08/28/16   Dustin Flock, MD  atenolol (TENORMIN) 100 MG tablet Take 1 tablet (100 mg total) by mouth daily. 04/15/17   Steele Sizer, MD  AURYXIA 1 GM 210 MG(Fe) tablet Take 420 mg by mouth 2 (two) times daily. 04/09/17   [provider]  cloNIDine (CATAPRES) 0.1 MG tablet Take 1 tablet (0.1 mg total) by mouth 2 (two) times daily. Patient taking differently: Take 0.1 mg daily by mouth.  08/27/16   Dustin Flock, MD  colchicine 0.6 MG tablet Take 1 tablet (0.6 mg total) by mouth daily as needed. Patient taking differently: Take 0.6 mg by mouth daily as needed (for gout flare ups.).  03/16/16   Roselee Nova, MD  Dermatological Products, [place]. (LEVICYN) GEL Apply 1 application topically 2 (two) times daily as needed (for itchy skin.).  12/13/16   [provider]  DESCOVY 200-25 MG tablet  Take 1 tablet by mouth daily. 04/22/17   [provider]  doxycycline (VIBRA-TABS) 100 MG tablet Take 100 mg by mouth 2 (two) times daily as needed (for skin rash).    [provider]  doxycycline (VIBRAMYCIN) 100 MG capsule Take 1 capsule by mouth 2 (two) times daily. 03/01/17   [provider]  fluticasone (FLONASE) 50 MCG/ACT nasal spray Place 1 spray into both nostrils 2 (two) times daily. 04/16/17    Cuthriell, Charline Bills, PA-C  furosemide (LASIX) 40 MG tablet Take 40 mg by mouth daily.  12/06/16   [provider]  gentamicin cream (GARAMYCIN) 0.1 % Apply 1 application topically daily as needed. Itchy/irritated skin. 02/01/17   [provider]  HYDROcodone-acetaminophen (NORCO) 5-325 MG tablet Take 1 tablet every 6 (six) hours as needed by mouth for moderate pain. Patient not taking: Reported on 02/15/2017 12/28/16   Algernon Huxley, MD  hydrOXYzine (ATARAX/VISTARIL) 25 MG tablet Take 25 mg by mouth 2 (two) times daily as needed for itching.  12/07/16   [provider]  ibuprofen (ADVIL,MOTRIN) 200 MG tablet Take 400 mg by mouth every 8 (eight) hours as needed (for pain.).    [provider]  loratadine (CLARITIN) 10 MG tablet Take 10 mg by mouth daily.    [provider]  meloxicam (MOBIC) 15 MG tablet Take 1 tablet (15 mg total) by mouth daily. 04/16/17   Cuthriell, Charline Bills, PA-C  methocarbamol (ROBAXIN) 500 MG tablet Take 1 tablet (500 mg total) by mouth 4 (four) times daily. 04/16/17   Cuthriell, Charline Bills, PA-C  predniSONE (DELTASONE) 20 MG tablet Take 2 tablets (40 mg total) by mouth daily with breakfast. Patient not taking: Reported on 12/21/2016 10/12/16   Vaughan Basta, MD  predniSONE (DELTASONE) 20 MG tablet Take 2 tablets (40 mg total) by mouth daily for 4 days. 06/02/17 06/06/17  Arta Silence, MD  TIVICAY 50 MG tablet Take 1 tablet by mouth daily. 04/22/17   [provider]  valACYclovir (VALTREX) 500 MG tablet Take 500 mg by mouth daily as needed (for flare ups).  12/06/16   [provider]  zolpidem (AMBIEN) 5 MG tablet Take 1 tablet (5 mg total) by mouth at bedtime. Patient taking differently: Take 5 mg by mouth at bedtime as needed (for sleep.).  08/27/16   Dustin Flock, MD    Allergies Quinapril hcl  Family History  Problem Relation Age of Onset  . Bradycardia Maternal Grandmother        with  pacemaker    Social History Social History   Tobacco Use  . Smoking status: Never Smoker  . Smokeless tobacco: Never Used  Substance Use Topics  . Alcohol use: Yes    Alcohol/week: 1.2 oz    Types: 2 Standard drinks or equivalent per week    Comment: Just on the weekend  . Drug use: No    Review of Systems  Constitutional: No fever. Eyes: No redness. ENT: No sore throat. Cardiovascular: Denies chest pain. Respiratory: Denies shortness of breath. Gastrointestinal: No vomiting.  Genitourinary: Negative for flank pain.  Musculoskeletal: Positive for joint pain. Skin: Negative for rash. Neurological: Negative for focal weakness or numbness.   ____________________________________________   PHYSICAL EXAM:  VITAL SIGNS: ED Triage Vitals  Enc Vitals Group     BP 06/01/17 0913 (!) 165/96     Pulse Rate 06/01/17 0913 70     Resp 06/01/17 0913 18     Temp 06/01/17 0913 98.8 F (  37.1 C)     Temp Source 06/01/17 0913 Oral     SpO2 06/01/17 0913 97 %     Weight 06/01/17 0921 190 lb (86.2 kg)     Height 06/01/17 0921 6\' 2"  (1.88 m)     Head Circumference --      Peak Flow --      Pain Score 06/01/17 0920 6     Pain Loc --      Pain Edu? --      Excl. in [place]? --     Constitutional: Alert and oriented. Well appearing and in no acute distress. Eyes: Conjunctivae are normal.  Head: Atraumatic. Nose: No congestion/rhinnorhea. Mouth/Throat: Mucous membranes are moist.   Neck: Normal range of motion.  Cardiovascular:  Good peripheral circulation. Respiratory: Normal respiratory effort.   Gastrointestinal:No distention.  Musculoskeletal: No lower extremity edema.  Extremities warm and well perfused.  FROM at all joints of upper and lower extremities, with no visible erythema, swelling, induration, or warmth.  2+ distal pulses to all extremities. Neurologic:  Normal speech and language. No gross focal neurologic deficits are appreciated.  Skin:  Skin is warm and dry. No rash  noted. Psychiatric: Mood and affect are normal. Speech and behavior are normal.  ____________________________________________   LABS (all labs ordered are listed, but only abnormal results are displayed)  Labs Reviewed  CBC - Abnormal; Notable for the following components:      Result Value   RBC 2.82 (*)    Hemoglobin 9.8 (*)    HCT 28.3 (*)    MCV 100.4 (*)    MCH 34.7 (*)    RDW 17.7 (*)    All other components within normal limits  COMPREHENSIVE METABOLIC PANEL - Abnormal; Notable for the following components:   Glucose, Bld 118 (*)    BUN 98 (*)    Creatinine, Ser 13.43 (*)    Calcium 8.8 (*)    Albumin 3.2 (*)    ALT 7 (*)    GFR calc non Af Amer 4 (*)    GFR calc Af Amer 4 (*)    All other components within normal limits  URIC ACID   ____________________________________________  EKG   ____________________________________________  RADIOLOGY    ____________________________________________   PROCEDURES  Procedure(s) performed: No  Procedures  Critical Care performed: No ____________________________________________   INITIAL IMPRESSION / ASSESSMENT AND PLAN / ED COURSE  Pertinent labs & imaging results that were available during my care of the patient were reviewed by me and considered in my medical decision making (see chart for details).  57 year old male with PMH as noted above presents with bilateral joint pain over the last week, which is atraumatic and not associated with any swelling.  It has only been partially helped by meloxicam and Robaxin which he has been on.  On exam, vitals are normal except for hypertension, patient is well-appearing, the remainder the exam is as described above.  Labs are unremarkable, and consistent with patient's baseline.  Overall presentation is most consistent with some type of atypical gout (which patient has a history of), or other nonspecific inflammatory cause.  I consulted Dr. Zollie Scale from nephrology who is  familiar with the patient, and advises that this is similar to some of his prior gout flares.  He recommended starting the patient on a 5-day course of prednisone in addition to the other medications he is already on.  The patient agrees with the plan.  Return precautions given, and he expresses  understanding.      ____________________________________________   FINAL CLINICAL IMPRESSION(S) / ED DIAGNOSES  Final diagnoses:  Arthralgia, unspecified joint      NEW MEDICATIONS STARTED DURING THIS VISIT:  New Prescriptions   PREDNISONE (DELTASONE) 20 MG TABLET    Take 2 tablets (40 mg total) by mouth daily for 4 days.     Note:  This document was prepared using Dragon voice recognition software and may include unintentional dictation errors.    Arta Silence, MD 06/01/17 1256

## 2017-06-01 NOTE — Discharge Instructions (Addendum)
Take the prednisone as prescribed starting tomorrow and finish the full course.  You can continue to take your other medications as prescribed.  Follow-up with your regular doctors.  Return to the ER for new, worsening, or persistent severe pain, weakness, numbness, swelling, or any other new or worsening symptoms that concern you.

## 2017-07-05 ENCOUNTER — Other Ambulatory Visit: Payer: Self-pay

## 2017-07-05 ENCOUNTER — Encounter: Payer: Self-pay | Admitting: *Deleted

## 2017-07-05 ENCOUNTER — Emergency Department
Admission: EM | Admit: 2017-07-05 | Discharge: 2017-07-05 | Disposition: A | Discharge status: Home / Self Care | Payer: PRIVATE HEALTH INSURANCE | Attending: Emergency Medicine | Admitting: Emergency Medicine

## 2017-07-05 DIAGNOSIS — Z940 Kidney transplant status: Secondary | ICD-10-CM | POA: Diagnosis not present

## 2017-07-05 DIAGNOSIS — N186 End stage renal disease: Secondary | ICD-10-CM | POA: Diagnosis not present

## 2017-07-05 DIAGNOSIS — I120 Hypertensive chronic kidney disease with stage 5 chronic kidney disease or end stage renal disease: Secondary | ICD-10-CM | POA: Diagnosis not present

## 2017-07-05 DIAGNOSIS — Z79899 Other long term (current) drug therapy: Secondary | ICD-10-CM | POA: Diagnosis not present

## 2017-07-05 DIAGNOSIS — M546 Pain in thoracic spine: Secondary | ICD-10-CM | POA: Insufficient documentation

## 2017-07-05 DIAGNOSIS — B20 Human immunodeficiency virus [HIV] disease: Secondary | ICD-10-CM | POA: Diagnosis not present

## 2017-07-05 DIAGNOSIS — M542 Cervicalgia: Secondary | ICD-10-CM | POA: Diagnosis not present

## 2017-07-05 DIAGNOSIS — M549 Dorsalgia, unspecified: Secondary | ICD-10-CM | POA: Diagnosis present

## 2017-07-05 MED ORDER — MELOXICAM 15 MG PO TABS: 15.0000 mg | ORAL_TABLET | Freq: Every day | ORAL | 0 refills | Status: AC

## 2017-07-05 MED ORDER — METHOCARBAMOL 500 MG PO TABS: 500.0000 mg | ORAL_TABLET | Freq: Four times a day (QID) | ORAL | 0 refills | Status: DC

## 2017-07-05 NOTE — ED Notes (Signed)
See triage note  Presents with some discomfort in right side neck for about 2-3 weeks  Then developed some left lower back pain couple of days ago  Thinks pain came from lifting heavy boxes  Ambulates well  Describe pain as muscle spasms

## 2017-07-05 NOTE — ED Provider Notes (Signed)
[facility] Emergency Department Provider Note  ____________________________________________  Time seen: Approximately 2:38 PM  I have reviewed the triage vital signs and the nursing notes.   HISTORY  Chief Complaint Back Pain    HPI Louis [name] is a 57 y.o. male presents emergency department for evaluation of left-sided back pain for 5 days.  Patient states that he was lifting heavy boxes at work over the weekend and is concerned that he pulled a muscle.  He has also had right sided neck pain on and off for months. He denies any currently. Pain does not radiate and has not changed in character. Muscle relaxers have worked in the past. He has end-stage renal failure and is on dialysis.  He has not missed any dialysis treatments.  He had blood work drawn this week with nephrologist.  He does not have a primary care provider.  No bowel or bladder dysfunction or saddle paresthesias.  No radicular symptoms.  No fever, chills, nausea, vomiting, abdominal pain, dysuria, hematuria.  Past Medical History:  Diagnosis Date  . Chronic kidney disease    esrd  . Dermatophytosis of foot 06/23/2004  . Environmental allergies   . Gout   . HIV infection ([place]) 12/1995  . Hypertension   . Seborrhea 06/23/2004    Patient Active Problem List   Diagnosis Date Noted  . ESRD on dialysis ([place]) 12/19/2016  . AKI (acute kidney injury) ([place]) 08/22/2016  . Baker's cyst of knee, right 05/17/2016  . Elevated serum creatinine 05/17/2016  . Otalgia of left ear 03/29/2016  . Neck muscle spasm 03/16/2016  . Erythematous rash 03/16/2016  . ERRONEOUS ENCOUNTER--DISREGARD 03/10/2016  . Gout 01/05/2015  . Renal insufficiency 11/07/2012  . Hypertension 06/23/2010  . Rosacea 06/23/2010  . Encounter for long-term (current) use of other medications 06/09/2010  . Screening examination for venereal disease 06/09/2010  . Encounter for preventive health examination   . Human  immunodeficiency virus (HIV) disease ([place]) 01/02/1996    Past Surgical History:  Procedure Laterality Date  . CAPD INSERTION N/A 12/28/2016   Procedure: LAPAROSCOPIC INSERTION CONTINUOUS AMBULATORY PERITONEAL DIALYSIS  (CAPD) CATHETER;  Surgeon: Algernon Huxley, MD;  Location: [facility] ORS;  Service: Vascular;  Laterality: N/A;  . DIALYSIS/PERMA CATHETER INSERTION N/A 10/09/2016   Procedure: DIALYSIS/PERMA CATHETER INSERTION;  Surgeon: Algernon Huxley, MD;  Location: [place] [facility];  Service: Cardiovascular;  Laterality: N/A;  . DIALYSIS/PERMA CATHETER REMOVAL N/A 02/19/2017   Procedure: DIALYSIS/PERMA CATHETER REMOVAL;  Surgeon: Algernon Huxley, MD;  Location: [place] [facility];  Service: Cardiovascular;  Laterality: N/A;  . HERNIA REPAIR Bilateral    inguinal    Prior to Admission medications   Medication Sig Start Date End Date Taking? Authorizing Provider  amLODipine (NORVASC) 10 MG tablet Take 1 tablet (10 mg total) by mouth daily. 08/28/16   Dustin Flock, MD  atenolol (TENORMIN) 100 MG tablet Take 1 tablet (100 mg total) by mouth daily. 04/15/17   Steele Sizer, MD  AURYXIA 1 GM 210 MG(Fe) tablet Take 420 mg by mouth 2 (two) times daily. 04/09/17   [provider]  cloNIDine (CATAPRES) 0.1 MG tablet Take 1 tablet (0.1 mg total) by mouth 2 (two) times daily. Patient taking differently: Take 0.1 mg daily by mouth.  08/27/16   Dustin Flock, MD  colchicine 0.6 MG tablet Take 1 tablet (0.6 mg total) by mouth daily as needed. Patient taking differently: Take 0.6 mg by mouth daily as needed (for gout flare  ups.).  03/16/16   Roselee Nova, MD  Dermatological Products, [place]. (LEVICYN) GEL Apply 1 application topically 2 (two) times daily as needed (for itchy skin.).  12/13/16   [provider]  DESCOVY 200-25 MG tablet Take 1 tablet by mouth daily. 04/22/17   [provider]  doxycycline (VIBRA-TABS) 100 MG tablet Take 100 mg by mouth 2 (two) times daily as needed (for  skin rash).    [provider]  doxycycline (VIBRAMYCIN) 100 MG capsule Take 1 capsule by mouth 2 (two) times daily. 03/01/17   [provider]  fluticasone (FLONASE) 50 MCG/ACT nasal spray Place 1 spray into both nostrils 2 (two) times daily. 04/16/17   Cuthriell, Charline Bills, PA-C  furosemide (LASIX) 40 MG tablet Take 40 mg by mouth daily.  12/06/16   [provider]  gentamicin cream (GARAMYCIN) 0.1 % Apply 1 application topically daily as needed. Itchy/irritated skin. 02/01/17   [provider]  HYDROcodone-acetaminophen (NORCO) 5-325 MG tablet Take 1 tablet every 6 (six) hours as needed by mouth for moderate pain. Patient not taking: Reported on 02/15/2017 12/28/16   Algernon Huxley, MD  hydrOXYzine (ATARAX/VISTARIL) 25 MG tablet Take 25 mg by mouth 2 (two) times daily as needed for itching.  12/07/16   [provider]  ibuprofen (ADVIL,MOTRIN) 200 MG tablet Take 400 mg by mouth every 8 (eight) hours as needed (for pain.).    [provider]  loratadine (CLARITIN) 10 MG tablet Take 10 mg by mouth daily.    [provider]  meloxicam (MOBIC) 15 MG tablet Take 1 tablet (15 mg total) by mouth daily for 10 days. 07/05/17 07/15/17  Laban Emperor, PA-C  methocarbamol (ROBAXIN) 500 MG tablet Take 1 tablet (500 mg total) by mouth 4 (four) times daily. 07/05/17   Laban Emperor, PA-C  predniSONE (DELTASONE) 20 MG tablet Take 2 tablets (40 mg total) by mouth daily with breakfast. Patient not taking: Reported on 12/21/2016 10/12/16   Vaughan Basta, MD  TIVICAY 50 MG tablet Take 1 tablet by mouth daily. 04/22/17   [provider]  valACYclovir (VALTREX) 500 MG tablet Take 500 mg by mouth daily as needed (for flare ups).  12/06/16   [provider]  zolpidem (AMBIEN) 5 MG tablet Take 1 tablet (5 mg total) by mouth at bedtime. Patient taking differently: Take 5 mg by mouth at bedtime as needed (for sleep.).  08/27/16   Dustin Flock,  MD    Allergies Quinapril hcl  Family History  Problem Relation Age of Onset  . Bradycardia Maternal Grandmother        with pacemaker    Social History Social History   Tobacco Use  . Smoking status: Never Smoker  . Smokeless tobacco: Never Used  Substance Use Topics  . Alcohol use: Yes    Alcohol/week: 1.2 oz    Types: 2 Standard drinks or equivalent per week    Comment: Just on the weekend  . Drug use: No     Review of Systems  Cardiovascular: No chest pain. Respiratory: No SOB. Gastrointestinal: No abdominal pain.  No nausea, no vomiting.  Genitourinary: Negative for dysuria. Musculoskeletal: Positive for back and neck pain. Skin: Negative for rash, abrasions, lacerations, ecchymosis. Neurological: Negative for headaches, numbness or tingling   ____________________________________________   PHYSICAL EXAM:  VITAL SIGNS: ED Triage Vitals [07/05/17 1409]  Enc Vitals Group     BP (!) 175/97     Pulse Rate 63  Resp 16     Temp 97.9 F (36.6 C)     Temp Source Oral     SpO2 99 %     Weight 195 lb (88.5 kg)     Height 6\' 2"  (1.88 m)     Head Circumference      Peak Flow      Pain Score 6     Pain Loc      Pain Edu?      Excl. in [place]?      Constitutional: Alert and oriented. Well appearing and in no acute distress. Sitting comfortably on side of bed. Eyes: Conjunctivae are normal. PERRL. EOMI. Head: Atraumatic. ENT:      Ears:      Nose: No congestion/rhinnorhea.      Mouth/Throat: Mucous membranes are moist.  Neck: No stridor. No cervical spine tenderness to palpation. Full ROM of neck.  Cardiovascular: Normal rate, regular rhythm.  Good peripheral circulation. Respiratory: Normal respiratory effort without tachypnea or retractions. Lungs CTAB. Good air entry to the bases with no decreased or absent breath sounds. Gastrointestinal: Bowel sounds 4 quadrants. Soft and nontender to palpation. No guarding or rigidity. No palpable masses. No  distention. No CVA tenderness. Musculoskeletal: Full range of motion to all extremities. No gross deformities appreciated. Mild tenderness to palpation of left thoracic paraspinal muscle. No tenderness to palpation over thoracic or lumbar spine. Pain elicited with rotation of spine. Strength equal in lower extremities bilaterally. Normal gait.  Neurologic:  Normal speech and language. No gross focal neurologic deficits are appreciated.  Skin:  Skin is warm, dry and intact. No rash noted. Psychiatric: Mood and affect are normal. Speech and behavior are normal. Patient exhibits appropriate insight and judgement.   ____________________________________________   LABS (all labs ordered are listed, but only abnormal results are displayed)  Labs Reviewed - No data to display ____________________________________________  EKG   ____________________________________________  RADIOLOGY   No results found.  ____________________________________________    PROCEDURES  Procedure(s) performed:    Procedures    Medications - No data to display   ____________________________________________   INITIAL IMPRESSION / ASSESSMENT AND PLAN / ED COURSE  Pertinent labs & imaging results that were available during my care of the patient were reviewed by me and considered in my medical decision making (see chart for details).  Review of the [place] CSRS was performed in accordance of the [place] prior to dispensing any controlled drugs.   Patient's diagnosis is consistent with muscle spasm. Vital signs and exam are reassuring. Patient declines bloodwork. No bowel or bladder dysfunction or saddle paresthesias. No urinary symptoms.  He has appointment with nephrology next week. He was encouraged to establish care with primary care. He has been on robaxin and meloxicam in the past with improvement. Work note was provided. Patient will be discharged home with prescriptions for robaxin and meloxicam. Patient  is to follow up with PCP as directed. Patient is given ED precautions to return to the ED for any worsening or new symptoms.     ____________________________________________  FINAL CLINICAL IMPRESSION(S) / ED DIAGNOSES  Final diagnoses:  Mid back pain  Neck pain      NEW MEDICATIONS STARTED DURING THIS VISIT:  ED Discharge Orders        Ordered    methocarbamol (ROBAXIN) 500 MG tablet  4 times daily     07/05/17 1501    meloxicam (MOBIC) 15 MG tablet  Daily     07/05/17 1501  This chart was dictated using voice recognition software/Dragon. Despite best efforts to proofread, errors can occur which can change the meaning. Any change was purely unintentional.    Laban Emperor, PA-C 07/05/17 [place], [place], MD 07/23/17 1016

## 2017-07-05 NOTE — ED Triage Notes (Addendum)
Lower left sided back pain and muscle spasms that pt reports having had a hx of. Pt reports feeling as though he did something to it at work, "maybe twisted too much or something" No changes in urine and no dysuria reported. Pain is reported to increase with movement.

## 2017-07-15 ENCOUNTER — Emergency Department
Admission: EM | Admit: 2017-07-15 | Discharge: 2017-07-15 | Disposition: A | Discharge status: Home / Self Care | Payer: PRIVATE HEALTH INSURANCE | Attending: Emergency Medicine | Admitting: Emergency Medicine

## 2017-07-15 ENCOUNTER — Encounter: Payer: Self-pay | Admitting: Emergency Medicine

## 2017-07-15 DIAGNOSIS — I120 Hypertensive chronic kidney disease with stage 5 chronic kidney disease or end stage renal disease: Secondary | ICD-10-CM | POA: Insufficient documentation

## 2017-07-15 DIAGNOSIS — H5789 Other specified disorders of eye and adnexa: Secondary | ICD-10-CM | POA: Diagnosis present

## 2017-07-15 DIAGNOSIS — H1033 Unspecified acute conjunctivitis, bilateral: Secondary | ICD-10-CM | POA: Insufficient documentation

## 2017-07-15 DIAGNOSIS — N186 End stage renal disease: Secondary | ICD-10-CM | POA: Diagnosis not present

## 2017-07-15 DIAGNOSIS — Z79899 Other long term (current) drug therapy: Secondary | ICD-10-CM | POA: Diagnosis not present

## 2017-07-15 DIAGNOSIS — H11421 Conjunctival edema, right eye: Secondary | ICD-10-CM | POA: Diagnosis not present

## 2017-07-15 DIAGNOSIS — Z992 Dependence on renal dialysis: Secondary | ICD-10-CM | POA: Insufficient documentation

## 2017-07-15 DIAGNOSIS — B20 Human immunodeficiency virus [HIV] disease: Secondary | ICD-10-CM | POA: Insufficient documentation

## 2017-07-15 MED ORDER — POLYMYXIN B-TRIMETHOPRIM 10000-0.1 UNIT/ML-% OP SOLN: 2.0000 [drp] | Freq: Four times a day (QID) | OPHTHALMIC | 0 refills | Status: DC

## 2017-07-15 MED ORDER — KETOROLAC TROMETHAMINE 0.5 % OP SOLN: 1.0000 [drp] | Freq: Four times a day (QID) | OPHTHALMIC | 0 refills | Status: DC

## 2017-07-15 MED ORDER — DEXAMETHASONE SODIUM PHOSPHATE 0.1 % OP SOLN: 1.0000 [drp] | OPHTHALMIC | 0 refills | Status: DC

## 2017-07-15 NOTE — ED Triage Notes (Signed)
Patient presents to the ED with bilaterally eye drainage.  Patient states, "It's irritating."  Patient states his right eye is blurry.  Left eye is reddened.  Patient is in no obvious distress at this time.

## 2017-07-15 NOTE — ED Notes (Signed)
Pt discharged to home.  Family member driving.  Discharge instructions reviewed.  Verbalized understanding.  No questions or concerns at this time.  Teach back verified.  Pt in NAD.  No items left in ED.   

## 2017-07-15 NOTE — ED Provider Notes (Signed)
[facility] Emergency Department Provider Note  ____________________________________________  Time seen: Approximately 6:34 PM  I have reviewed the triage vital signs and the nursing notes.   HISTORY  Chief Complaint Eye Problem    HPI Louis [name] is a 57 y.o. male who presents emergency department complaining of bilateral eye redness, drainage, irritation.  Patient reports that the right eye became erythematous, draining 3 days ago.  Left eye started today.  Patient does wear glasses but denies contact use.  Patient reports that sometimes the drainage will cause his right eye to be blurry but after he blinks vision improves.  No loss of field-of-view.  Patient denies any trauma.  No foreign body sensation.  No other complaints at this time.  Patient has a medical history with chronic kidney disease, HIV.  She denies any complaints with chronic medical problems.  Past Medical History:  Diagnosis Date  . Chronic kidney disease    esrd  . Dermatophytosis of foot 06/23/2004  . Environmental allergies   . Gout   . HIV infection ([place]) 12/1995  . Hypertension   . Seborrhea 06/23/2004    Patient Active Problem List   Diagnosis Date Noted  . ESRD on dialysis ([place]) 12/19/2016  . AKI (acute kidney injury) ([place]) 08/22/2016  . Baker's cyst of knee, right 05/17/2016  . Elevated serum creatinine 05/17/2016  . Otalgia of left ear 03/29/2016  . Neck muscle spasm 03/16/2016  . Erythematous rash 03/16/2016  . ERRONEOUS ENCOUNTER--DISREGARD 03/10/2016  . Gout 01/05/2015  . Renal insufficiency 11/07/2012  . Hypertension 06/23/2010  . Rosacea 06/23/2010  . Encounter for long-term (current) use of other medications 06/09/2010  . Screening examination for venereal disease 06/09/2010  . Encounter for preventive health examination   . Human immunodeficiency virus (HIV) disease ([place]) 01/02/1996    Past Surgical History:  Procedure Laterality Date  . CAPD  INSERTION N/A 12/28/2016   Procedure: LAPAROSCOPIC INSERTION CONTINUOUS AMBULATORY PERITONEAL DIALYSIS  (CAPD) CATHETER;  Surgeon: Algernon Huxley, MD;  Location: [facility] ORS;  Service: Vascular;  Laterality: N/A;  . DIALYSIS/PERMA CATHETER INSERTION N/A 10/09/2016   Procedure: DIALYSIS/PERMA CATHETER INSERTION;  Surgeon: Algernon Huxley, MD;  Location: [place] [facility] LAB;  Service: Cardiovascular;  Laterality: N/A;  . DIALYSIS/PERMA CATHETER REMOVAL N/A 02/19/2017   Procedure: DIALYSIS/PERMA CATHETER REMOVAL;  Surgeon: Algernon Huxley, MD;  Location: [place] [facility];  Service: Cardiovascular;  Laterality: N/A;  . HERNIA REPAIR Bilateral    inguinal    Prior to Admission medications   Medication Sig Start Date End Date Taking? Authorizing Provider  amLODipine (NORVASC) 10 MG tablet Take 1 tablet (10 mg total) by mouth daily. 08/28/16   Dustin Flock, MD  atenolol (TENORMIN) 100 MG tablet Take 1 tablet (100 mg total) by mouth daily. 04/15/17   Steele Sizer, MD  AURYXIA 1 GM 210 MG(Fe) tablet Take 420 mg by mouth 2 (two) times daily. 04/09/17   [provider]  cloNIDine (CATAPRES) 0.1 MG tablet Take 1 tablet (0.1 mg total) by mouth 2 (two) times daily. Patient taking differently: Take 0.1 mg daily by mouth.  08/27/16   Dustin Flock, MD  colchicine 0.6 MG tablet Take 1 tablet (0.6 mg total) by mouth daily as needed. Patient taking differently: Take 0.6 mg by mouth daily as needed (for gout flare ups.).  03/16/16   Roselee Nova, MD  Dermatological Products, [place]. (LEVICYN) GEL Apply 1 application topically 2 (two) times daily as needed (for  itchy skin.).  12/13/16   [provider]  DESCOVY 200-25 MG tablet Take 1 tablet by mouth daily. 04/22/17   [provider]  dexamethasone (DECADRON) 0.1 % ophthalmic solution Place 1 drop into the right eye every 4 (four) hours. 07/15/17   Shogo Larkey, Charline Bills, PA-C  doxycycline (VIBRA-TABS) 100 MG tablet Take 100 mg by mouth 2 (two)  times daily as needed (for skin rash).    [provider]  doxycycline (VIBRAMYCIN) 100 MG capsule Take 1 capsule by mouth 2 (two) times daily. 03/01/17   [provider]  fluticasone (FLONASE) 50 MCG/ACT nasal spray Place 1 spray into both nostrils 2 (two) times daily. 04/16/17   Chamika Cunanan, Charline Bills, PA-C  furosemide (LASIX) 40 MG tablet Take 40 mg by mouth daily.  12/06/16   [provider]  gentamicin cream (GARAMYCIN) 0.1 % Apply 1 application topically daily as needed. Itchy/irritated skin. 02/01/17   [provider]  HYDROcodone-acetaminophen (NORCO) 5-325 MG tablet Take 1 tablet every 6 (six) hours as needed by mouth for moderate pain. Patient not taking: Reported on 02/15/2017 12/28/16   Algernon Huxley, MD  hydrOXYzine (ATARAX/VISTARIL) 25 MG tablet Take 25 mg by mouth 2 (two) times daily as needed for itching.  12/07/16   [provider]  ibuprofen (ADVIL,MOTRIN) 200 MG tablet Take 400 mg by mouth every 8 (eight) hours as needed (for pain.).    [provider]  ketorolac (ACULAR) 0.5 % ophthalmic solution Place 1 drop into the right eye 4 (four) times daily. 07/15/17   Melquan Ernsberger, Charline Bills, PA-C  loratadine (CLARITIN) 10 MG tablet Take 10 mg by mouth daily.    [provider]  meloxicam (MOBIC) 15 MG tablet Take 1 tablet (15 mg total) by mouth daily for 10 days. 07/05/17 07/15/17  Laban Emperor, PA-C  methocarbamol (ROBAXIN) 500 MG tablet Take 1 tablet (500 mg total) by mouth 4 (four) times daily. 07/05/17   Laban Emperor, PA-C  predniSONE (DELTASONE) 20 MG tablet Take 2 tablets (40 mg total) by mouth daily with breakfast. Patient not taking: Reported on 12/21/2016 10/12/16   Vaughan Basta, MD  TIVICAY 50 MG tablet Take 1 tablet by mouth daily. 04/22/17   [provider]  trimethoprim-polymyxin b (POLYTRIM) ophthalmic solution Place 2 drops into both eyes every 6 (six) hours. 07/15/17   Kazue Cerro, Charline Bills, PA-C   valACYclovir (VALTREX) 500 MG tablet Take 500 mg by mouth daily as needed (for flare ups).  12/06/16   [provider]  zolpidem (AMBIEN) 5 MG tablet Take 1 tablet (5 mg total) by mouth at bedtime. Patient taking differently: Take 5 mg by mouth at bedtime as needed (for sleep.).  08/27/16   Dustin Flock, MD    Allergies Quinapril hcl  Family History  Problem Relation Age of Onset  . Bradycardia Maternal Grandmother        with pacemaker    Social History Social History   Tobacco Use  . Smoking status: Never Smoker  . Smokeless tobacco: Never Used  Substance Use Topics  . Alcohol use: Yes    Alcohol/week: 1.2 oz    Types: 2 Standard drinks or equivalent per week    Comment: Just on the weekend  . Drug use: No     Review of Systems  Constitutional: No fever/chills Eyes: No visual changes.  Positive for bilateral eye erythema, drainage, irritation. ENT: No upper respiratory complaints. Cardiovascular: no chest pain. Respiratory: no cough. No SOB. Gastrointestinal: No abdominal  pain.  No nausea, no vomiting.   Musculoskeletal: Negative for musculoskeletal pain. Skin: Negative for rash, abrasions, lacerations, ecchymosis. Neurological: Negative for headaches, focal weakness or numbness. 10-point ROS otherwise negative.  ____________________________________________   PHYSICAL EXAM:  VITAL SIGNS: ED Triage Vitals  Enc Vitals Group     BP 07/15/17 1811 (!) 143/95     Pulse Rate 07/15/17 1811 (!) 58     Resp 07/15/17 1811 18     Temp 07/15/17 1811 98.4 F (36.9 C)     Temp Source 07/15/17 1811 Oral     SpO2 07/15/17 1811 98 %     Weight 07/15/17 1803 200 lb (90.7 kg)     Height 07/15/17 1803 6\' 2"  (1.88 m)     Head Circumference --      Peak Flow --      Pain Score 07/15/17 1803 0     Pain Loc --      Pain Edu? --      Excl. in [place]? --      Constitutional: Alert and oriented. Well appearing and in no acute distress. Eyes: Conjunctiva are  erythematous bilaterally.  Obvious ecchymosis to the inferior right eye.[place] PERRL. EOMI. scopic exam reveals red reflex bilaterally.  Vasculature and optic disc is unremarkable bilaterally.  No foreign body.   Head: Atraumatic. ENT:      Ears:       Nose: No congestion/rhinnorhea.      Mouth/Throat: Mucous membranes are moist.  Neck: No stridor.    Cardiovascular: Normal rate, regular rhythm. Normal S1 and S2.  Good peripheral circulation. Respiratory: Normal respiratory effort without tachypnea or retractions. Lungs CTAB. Good air entry to the bases with no decreased or absent breath sounds. Musculoskeletal: Full range of motion to all extremities. No gross deformities appreciated. Neurologic:  Normal speech and language. No gross focal neurologic deficits are appreciated.  Skin:  Skin is warm, dry and intact. No rash noted. Psychiatric: Mood and affect are normal. Speech and behavior are normal. Patient exhibits appropriate insight and judgement.   ____________________________________________   LABS (all labs ordered are listed, but only abnormal results are displayed)  Labs Reviewed - No data to display ____________________________________________  EKG   ____________________________________________  RADIOLOGY   No results found.  ____________________________________________    PROCEDURES  Procedure(s) performed:    Procedures    Medications - No data to display   ____________________________________________   INITIAL IMPRESSION / ASSESSMENT AND PLAN / ED COURSE  Pertinent labs & imaging results that were available during my care of the patient were reviewed by me and considered in my medical decision making (see chart for details).  Review of the [place] CSRS was performed in accordance of the [place] prior to dispensing any controlled drugs.     Patient's diagnosis is consistent with pectoral conjunctivitis bilaterally with chemosis to the right eye.  Patient  presents with symptoms began with the right eye for 3 days before progressing to the left.  Exam and symptoms are most consistent with bacterial conjunctivitis.  Differential included corneal ulceration, corneal abrasion, right eye trauma, bacterial/allergic/viral conjunctivitis,. Patient will be discharged home with prescriptions for Polytrim eyedrops, Acular for symptom relief, Decadron eyedrops for chemosis. Patient is to follow up with ophthalmology as needed or otherwise directed. Patient is given ED precautions to return to the ED for any worsening or new symptoms.     ____________________________________________  FINAL CLINICAL IMPRESSION(S) / ED DIAGNOSES  Final diagnoses:  Acute bacterial conjunctivitis of  both eyes  Chemosis of right conjunctiva      NEW MEDICATIONS STARTED DURING THIS VISIT:  ED Discharge Orders        Ordered    trimethoprim-polymyxin b (POLYTRIM) ophthalmic solution  Every 6 hours     07/15/17 1842    ketorolac (ACULAR) 0.5 % ophthalmic solution  4 times daily     07/15/17 1842    dexamethasone (DECADRON) 0.1 % ophthalmic solution  Every 4 hours     07/15/17 1842          This chart was dictated using voice recognition software/Dragon. Despite best efforts to proofread, errors can occur which can change the meaning. Any change was purely unintentional.    Darletta Moll, PA-C 07/15/17 1851    Nance Pear, MD 07/15/17 [phone]

## 2017-08-30 ENCOUNTER — Ambulatory Visit: Admit: 2017-08-30 | Discharge: 2017-08-31 | Payer: MEDICARE | Attending: Family | Primary: Family

## 2017-08-30 DIAGNOSIS — I10 Essential (primary) hypertension: Secondary | ICD-10-CM

## 2017-08-30 DIAGNOSIS — G479 Sleep disorder, unspecified: Principal | ICD-10-CM

## 2017-09-14 ENCOUNTER — Telehealth: Payer: Self-pay | Admitting: Emergency Medicine

## 2017-09-14 NOTE — Telephone Encounter (Signed)
Pt does not want an appt nor is he still coming here. He just needs you to tell him what the medication was that you all gave him for his sinus

## 2017-09-14 NOTE — Telephone Encounter (Signed)
Please call patient and schedule him a appointment for medications

## 2017-09-14 NOTE — Telephone Encounter (Signed)
Copied from [place] [id number]. Topic: Inquiry >> Sep 14, 2017  2:00 PM Neva Seat wrote: Dr. Rutherford Nail prescribed pt sinus medication in the past.   Pt wanting to know the name of the medication since he now has insurance and wants to start back taking the medication.

## 2017-09-14 NOTE — Telephone Encounter (Signed)
LVM for pt to call and schedule an appt °

## 2017-09-17 NOTE — Telephone Encounter (Signed)
Pt called to check status of what medication he use to take

## 2017-09-17 NOTE — Telephone Encounter (Signed)
Spoke with Bonnita Nasuti pertaining to what the patient is asking and all she saw in the chart was Amoxicillin. Left voice message for pt to return call 3:54pm

## 2017-09-17 NOTE — Telephone Encounter (Signed)
Patient must be seen.

## 2017-10-02 ENCOUNTER — Encounter: Admit: 2017-10-02 | Discharge: 2017-10-03 | Payer: MEDICARE

## 2017-10-02 DIAGNOSIS — N186 End stage renal disease: Principal | ICD-10-CM

## 2017-10-02 DIAGNOSIS — N185 Chronic kidney disease, stage 5: Secondary | ICD-10-CM

## 2017-10-02 DIAGNOSIS — N189 Chronic kidney disease, unspecified: Secondary | ICD-10-CM

## 2017-10-02 DIAGNOSIS — I10 Essential (primary) hypertension: Secondary | ICD-10-CM

## 2017-10-02 DIAGNOSIS — Z862 Personal history of diseases of the blood and blood-forming organs and certain disorders involving the immune mechanism: Secondary | ICD-10-CM

## 2017-10-02 DIAGNOSIS — B20 Human immunodeficiency virus [HIV] disease: Secondary | ICD-10-CM

## 2017-10-02 MED ORDER — VITAMIN B COMP NO.3-FOLIC ACID 1 MG-VIT C 60 MG-BIOTIN 300 MCG TABLET
ORAL_TABLET | Freq: Every day | ORAL | 4 refills | 0 days | Status: CP
Start: 2017-10-02 — End: ?

## 2017-10-05 ENCOUNTER — Encounter: Payer: Self-pay | Admitting: Family Medicine

## 2017-10-05 ENCOUNTER — Ambulatory Visit: Payer: PRIVATE HEALTH INSURANCE | Admitting: Family Medicine

## 2017-10-05 VITALS — BP 164/88 | HR 70 | Temp 98.6°F | Resp 16 | Ht 74.0 in | Wt 178.6 lb

## 2017-10-05 DIAGNOSIS — Z992 Dependence on renal dialysis: Secondary | ICD-10-CM

## 2017-10-05 DIAGNOSIS — B20 Human immunodeficiency virus [HIV] disease: Secondary | ICD-10-CM | POA: Diagnosis not present

## 2017-10-05 DIAGNOSIS — I10 Essential (primary) hypertension: Secondary | ICD-10-CM

## 2017-10-05 DIAGNOSIS — E441 Mild protein-calorie malnutrition: Secondary | ICD-10-CM

## 2017-10-05 DIAGNOSIS — Z23 Encounter for immunization: Secondary | ICD-10-CM

## 2017-10-05 DIAGNOSIS — N186 End stage renal disease: Secondary | ICD-10-CM | POA: Diagnosis not present

## 2017-10-05 DIAGNOSIS — M10061 Idiopathic gout, right knee: Secondary | ICD-10-CM | POA: Diagnosis not present

## 2017-10-05 MED ORDER — VITAMIN B COMP NO.3-FOLIC ACID 1 MG-VIT C 60 MG-BIOTIN 300 MCG TABLET
ORAL_TABLET | Freq: Every day | ORAL | 2 refills | 0 days | Status: SS
Start: 2017-10-05 — End: 2018-01-29

## 2017-10-05 NOTE — Progress Notes (Signed)
Name: Louis [name]   MRN: [id number]    DOB: [birth year]   Date:10/05/2017       Progress Note  Subjective  Chief Complaint  Chief Complaint  Patient presents with  . Gout    HPI  Acute Gout: getting Uloric from nephrologist, but recently switched to generic Uloric - about one week ago, and developed right pain, swelling and increase in warmth, he states initially pain was intense. He took some old colchicine and also took Tylenol and is feeling better today. Pain has almost resolved now. Very mild discomfort.   ESRD: he was seeing Dr. Holley Raring and is getting peritoneal lavage at home 4 times per week, but unable to sleep, feels tired all the time and is currently switching to Dr. Smith Mince [facility] nephrology) and is going to start hemodialysis. He still produces urine. He states itching is not present at this time. He has occasional nausea, but no vomiting, however poor appetite and has lost weight since last year . He was 204 lbs last November  HIV: used to see Dr. Ola Spurr, he states for the past year taking medication daily as prescribed. He is single. He will follow up with new provider as recommended.   Malnutrition: significant weight loss, since he got very sick last year, and diagnosed with CKI and started peritoneal dialysis    Patient Active Problem List   Diagnosis Date Noted  . History of anemia due to CKD 05/24/2017  . ESRD on dialysis ([place]) 12/19/2016  . 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 Baker's cyst of knee, right 05/17/2016  . Neck muscle spasm 03/16/2016  . Hypertension 06/23/2010  . Rosacea 06/23/2010  . Human immunodeficiency virus (HIV) disease ([place]) 01/02/1996    Past Surgical History:  Procedure Laterality Date  . CAPD INSERTION N/A 12/28/2016   Procedure: LAPAROSCOPIC INSERTION CONTINUOUS AMBULATORY PERITONEAL DIALYSIS  (CAPD) CATHETER;  Surgeon: Algernon Huxley, MD;  Location: [facility] ORS;  Service: Vascular;  Laterality: N/A;  .  DIALYSIS/PERMA CATHETER INSERTION N/A 10/09/2016   Procedure: DIALYSIS/PERMA CATHETER INSERTION;  Surgeon: Algernon Huxley, MD;  Location: [place] CV LAB;  Service: Cardiovascular;  Laterality: N/A;  . DIALYSIS/PERMA CATHETER REMOVAL N/A 02/19/2017   Procedure: DIALYSIS/PERMA CATHETER REMOVAL;  Surgeon: Algernon Huxley, MD;  Location: [place] CV LAB;  Service: Cardiovascular;  Laterality: N/A;  . HERNIA REPAIR Bilateral    inguinal    Family History  Problem Relation Age of Onset  . Bradycardia Maternal Grandmother        with pacemaker    Social History   Socioeconomic History  . Marital status: Single    Spouse name: Not on file  . Number of children: 0  . Years of education: Not on file  . Highest education level: Associate degree: occupational, Hotel manager, or vocational program  Occupational History  . Not on file  Social Needs  . Financial resource strain: Not very hard  . Food insecurity:    Worry: Never true    Inability: Never true  . Transportation needs:    Medical: No    Non-medical: No  Tobacco Use  . Smoking status: Never Smoker  . Smokeless tobacco: Never Used  Substance and Sexual Activity  . Alcohol use: Not Currently    Alcohol/week: 2.0 standard drinks    Types: 2 Standard drinks or equivalent per week    Comment: has not had any alcohol in a while  . Drug use: No  . Sexual activity:  Yes    Partners: Female    Birth control/protection: Condom  Lifestyle  . Physical activity:    Days per week: 0 days    Minutes per session: 0 min  . Stress: Only a little  Relationships  . Social connections:    Talks on phone: More than three times a week    Gets together: More than three times a week    Attends religious service: More than 4 times per year    Active member of club or organization: No    Attends meetings of clubs or organizations: Never    Relationship status: Never married  . Intimate partner violence:    Fear of current or ex partner: No     Emotionally abused: No    Physically abused: No    Forced sexual activity: No  Other Topics Concern  . Not on file  Social History Narrative  . Not on file     Current Outpatient Medications:  .  atenolol (TENORMIN) 100 MG tablet, Take 1 tablet (100 mg total) by mouth daily., Disp: 6 tablet, Rfl: 0 .  B Complex-C-Folic Acid (RENA-VITE RX) 1 MG TABS, Take 1 tablet by mouth daily., Disp: , Rfl:  .  cloNIDine (CATAPRES) 0.1 MG tablet, Take 1 tablet (0.1 mg total) by mouth 2 (two) times daily. (Patient taking differently: Take 0.1 mg daily by mouth. ), Disp: 60 tablet, Rfl: 11 .  doxycycline (VIBRAMYCIN) 100 MG capsule, Take 1 capsule by mouth 2 (two) times daily., Disp: , Rfl: 5 .  furosemide (LASIX) 40 MG tablet, Take 40 mg by mouth daily. , Disp: , Rfl:  .  amLODipine (NORVASC) 10 MG tablet, Take 1 tablet (10 mg total) by mouth daily., Disp: 30 tablet, Rfl: 0 .  BRYHALI 0.01 % LOTN, Apply 1 each topically daily., Disp: , Rfl: 0 .  DESCOVY 200-25 MG tablet, Take 1 tablet by mouth daily., Disp: , Rfl:  .  doxycycline (VIBRA-TABS) 100 MG tablet, Take 1 tablet by mouth daily., Disp: , Rfl:  .  doxycycline (VIBRA-TABS) 100 MG tablet, Take 1 tablet by mouth daily., Disp: , Rfl:  .  febuxostat (ULORIC) 40 MG tablet, Take 1 tablet by mouth daily., Disp: , Rfl:  .  ferrous sulfate 325 (65 FE) MG tablet, Take 1 tablet by mouth daily., Disp: , Rfl:  .  fluticasone (FLONASE) 50 MCG/ACT nasal spray, Place 1 spray into both nostrils 2 (two) times daily., Disp: 16 g, Rfl: 0 .  hydrOXYzine (ATARAX/VISTARIL) 25 MG tablet, Take 25 mg by mouth 2 (two) times daily as needed for itching. , Disp: , Rfl:  .  loratadine (CLARITIN) 10 MG tablet, Take 10 mg by mouth daily., Disp: , Rfl:  .  montelukast (SINGULAIR) 10 MG tablet, Take 10 mg by mouth daily., Disp: , Rfl: 3 .  TIVICAY 50 MG tablet, Take 1 tablet by mouth daily., Disp: , Rfl:  .  valACYclovir (VALTREX) 500 MG tablet, Take 500 mg by mouth daily as  needed (for flare ups). , Disp: , Rfl:   Allergies  Allergen Reactions  . Quinapril Hcl Swelling    Face swells     ROS  Ten systems reviewed and is negative except as mentioned in HPI   Objective  Vitals:   10/05/17 1458  BP: (!) 164/88  Pulse: 70  Resp: 16  Temp: 98.6 F (37 C)  TempSrc: Oral  SpO2: 99%  Weight: 178 lb 9.6 oz (81 kg)  Height: 6'  2" (1.88 m)    Body mass index is 22.93 kg/m.  Physical Exam  Constitutional: Patient appears well-developed and thin now  No distress.  HEENT: head atraumatic, normocephalic, pupils equal and reactive to light, neck supple, throat within normal limits Cardiovascular: Normal rate, regular rhythm and normal heart sounds.  No murmur heard. No BLE edema. Pulmonary/Chest: Effort normal and breath sounds normal. No respiratory distress. Abdominal: Soft.  There is no tenderness.He has peritoneal catheter in place, no signs of infection  Muscular Skeletal: mild effusion right knee, no redness significant pain or increase in warmth of right knee  Psychiatric: Patient has a normal mood and affect. behavior is normal. Judgment and thought content normal.  PHQ2/9: Depression screen [facility] 2/9 10/05/2017 03/29/2016 03/16/2016 01/05/2015 03/13/2013  Decreased Interest 0 0 0 0 0  Down, Depressed, Hopeless 0 0 0 0 0  PHQ - 2 Score 0 0 0 0 0  Altered sleeping 0 - - - -  Tired, decreased energy 0 - - - -  Change in appetite 0 - - - -  Feeling bad or failure about yourself  0 - - - -  Trouble concentrating 0 - - - -  Moving slowly or fidgety/restless 0 - - - -  Suicidal thoughts 0 - - - -  PHQ-9 Score 0 - - - -  Difficult doing work/chores Not difficult at all - - - -     Fall Risk: Fall Risk  10/05/2017 03/29/2016 03/16/2016 01/05/2015 03/13/2013  Falls in the past year? No No Yes No No  Number falls in past yr: - - 1 - -  Injury with Fall? - - Yes - -  Comment - - winded - -  Follow up - - Falls evaluation completed - -     Functional  Status Survey: Is the patient deaf or have difficulty hearing?: No Does the patient have difficulty seeing, even when wearing glasses/contacts?: No Does the patient have difficulty concentrating, remembering, or making decisions?: No Does the patient have difficulty walking or climbing stairs?: No Does the patient have difficulty dressing or bathing?: No Does the patient have difficulty doing errands alone such as visiting a doctor's office or shopping?: No   Assessment & Plan  1. ESRD on dialysis [facility])  Currently switching from Dr. Holley Raring to [facility]    2. Needs flu shot  - Flu Vaccine QUAD 6+ mos PF IM (Fluarix Quad PF)  3. Need for Tdap vaccination  - Tdap vaccine greater than or equal to 7yo IM  4. Human immunodeficiency virus (HIV) disease ([place])  Under the care of ID, was seeing Dr. Ola Spurr and will establish care with new provider at [facility]   5. Acute idiopathic gout of right knee  Episode is clearing, getting Uloric from nephrologist, but recently switched to generic and flare started 3 days ago on right knee    6. Mild protein-calorie malnutrition ([place])  Getting labs at nephrologist, last albumin on our chart it was around 3 but he has lost about 30 lbs in less than one year   7. Uncontrolled hypertension  Continue follow up with nephrologist

## 2017-11-19 ENCOUNTER — Other Ambulatory Visit: Payer: Self-pay | Admitting: Family Medicine

## 2017-11-19 NOTE — Telephone Encounter (Signed)
Copied from [place] [id number]. Topic: Quick Communication - See Telephone Encounter >> Nov 19, 2017 10:29 AM Louis [name] wrote: CRM for notification. See Telephone encounter for: 11/19/17.  Patient states that he needs his montelukast (SINGULAIR) 10 MG tablet increased. He has been taking 2 a day instead of 1 a day, he would like Bonnita Nasuti to call him back. Please advise. He said that he is running out and has 3 more pills left and wants a new script sent to [place].

## 2017-11-19 NOTE — Telephone Encounter (Signed)
Maximum dose is 10 mg daily , cannot take two daily , he can take loratadine 10 mg BID

## 2017-11-21 NOTE — Telephone Encounter (Signed)
Called patient left message for him to call me back

## 2017-11-21 NOTE — Telephone Encounter (Signed)
Pt returning call to [facility]. Requesting a call back.

## 2017-11-21 NOTE — Telephone Encounter (Signed)
Pt is calling back checking on medication refill

## 2017-11-22 NOTE — Telephone Encounter (Signed)
Pt calling again for [facility]. Pt states he may not be able to answer phone when she calls back but wanted to note it is OK to leave voicemail.

## 2017-11-22 NOTE — Telephone Encounter (Signed)
fyi

## 2018-01-04 ENCOUNTER — Ambulatory Visit: Payer: PRIVATE HEALTH INSURANCE | Admitting: Family Medicine

## 2018-01-04 ENCOUNTER — Encounter: Payer: Self-pay | Admitting: Family Medicine

## 2018-01-04 VITALS — BP 142/82 | HR 72 | Temp 98.2°F | Resp 16 | Ht 74.0 in | Wt 200.9 lb

## 2018-01-04 DIAGNOSIS — F1021 Alcohol dependence, in remission: Secondary | ICD-10-CM

## 2018-01-04 DIAGNOSIS — M1A3710 Chronic gout due to renal impairment, right ankle and foot, without tophus (tophi): Secondary | ICD-10-CM

## 2018-01-04 DIAGNOSIS — Z992 Dependence on renal dialysis: Secondary | ICD-10-CM

## 2018-01-04 DIAGNOSIS — J3089 Other allergic rhinitis: Secondary | ICD-10-CM | POA: Diagnosis not present

## 2018-01-04 DIAGNOSIS — I120 Hypertensive chronic kidney disease with stage 5 chronic kidney disease or end stage renal disease: Secondary | ICD-10-CM | POA: Diagnosis not present

## 2018-01-04 DIAGNOSIS — L719 Rosacea, unspecified: Secondary | ICD-10-CM

## 2018-01-04 DIAGNOSIS — B20 Human immunodeficiency virus [HIV] disease: Secondary | ICD-10-CM | POA: Diagnosis not present

## 2018-01-04 DIAGNOSIS — S8012XA Contusion of left lower leg, initial encounter: Secondary | ICD-10-CM

## 2018-01-04 DIAGNOSIS — N186 End stage renal disease: Secondary | ICD-10-CM

## 2018-01-04 DIAGNOSIS — L309 Dermatitis, unspecified: Secondary | ICD-10-CM | POA: Insufficient documentation

## 2018-01-04 DIAGNOSIS — M109 Gout, unspecified: Secondary | ICD-10-CM

## 2018-01-04 DIAGNOSIS — J302 Other seasonal allergic rhinitis: Secondary | ICD-10-CM

## 2018-01-04 MED ORDER — CETIRIZINE HCL 10 MG PO TABS: 10.0000 mg | ORAL_TABLET | Freq: Every day | ORAL | 11 refills | Status: DC

## 2018-01-04 MED ORDER — PREDNISONE 10 MG PO TABS: 10.0000 mg | ORAL_TABLET | Freq: Two times a day (BID) | ORAL | 0 refills | Status: DC | PRN

## 2018-01-04 NOTE — Progress Notes (Signed)
Name: Louis [name]   MRN: [id number]    DOB: [birth year]   Date:01/04/2018       Progress Note  Subjective  Chief Complaint  Chief Complaint  Patient presents with  . Follow-up    3 mth f/u  . Leg Injury    patient stated that he has a bruise on his upper left leg and it is very swollen    HPI  Gout: getting Uloric from nephrologist, he still has flares about once every 3 months, he needs to miss work when he has a flare, we will give him prednisone prn if needed.   ESRD: he was seeing Dr. Holley Raring and is getting peritoneal lavage at home 4 times per week, but unable to sleep, feels tired all the time he had a second opinion from  Dr. Smith Mince [facility] nephrology) he is getting switched to [place] now. He still produces urine. He states itching is not present at this time. He has occasional nausea, but no vomiting, his appetite has improved and  he has gained weight since last visit. He prefers continue peritoneal lavage so he can continue working.   Lack of sleep: feels tired, seen by neurologist and does not have sleep apnea. He has tried Ambien, Trazodone and another medication without help. Likely from having the beeping from peritoneal lavage or may be secondary to ESRD   HIV: used to see Dr. Ola Spurr, recently seen by another provider Dr Baruch Gouty, per patient titers negative but I am unable to find notes on his chart.   Thigh Injury; it happened last week, he was pushing a barrel into a hose, but he barrel slipped and hit him in his mid thigh, he felt pain and when he got home he had a large bruise, now bruise is down on knee, left leg and swelling of his left foot, improves with elevation, he did not tell his supervisor.   History of alcohol abuse: he used to be a heavy drinker, he used to drink a fith of liquor daily - usually vodka, he quit in 2017 once her found about kidney failure.    Patient Active Problem List   Diagnosis Date Noted  . History of anemia due to CKD  05/24/2017  . ESRD on dialysis ([place]) 12/19/2016  . 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 Baker's cyst of knee, right 05/17/2016  . Neck muscle spasm 03/16/2016  . Hypertension 06/23/2010  . Rosacea 06/23/2010  . Human immunodeficiency virus (HIV) disease ([place]) 01/02/1996    Past Surgical History:  Procedure Laterality Date  . CAPD INSERTION N/A 12/28/2016   Procedure: LAPAROSCOPIC INSERTION CONTINUOUS AMBULATORY PERITONEAL DIALYSIS  (CAPD) CATHETER;  Surgeon: Algernon Huxley, MD;  Location: [facility] ORS;  Service: Vascular;  Laterality: N/A;  . DIALYSIS/PERMA CATHETER INSERTION N/A 10/09/2016   Procedure: DIALYSIS/PERMA CATHETER INSERTION;  Surgeon: Algernon Huxley, MD;  Location: [place] CV LAB;  Service: Cardiovascular;  Laterality: N/A;  . DIALYSIS/PERMA CATHETER REMOVAL N/A 02/19/2017   Procedure: DIALYSIS/PERMA CATHETER REMOVAL;  Surgeon: Algernon Huxley, MD;  Location: [place] CV LAB;  Service: Cardiovascular;  Laterality: N/A;  . HERNIA REPAIR Bilateral    inguinal    Family History  Problem Relation Age of Onset  . Bradycardia Maternal Grandmother        with pacemaker    Social History   Socioeconomic History  . Marital status: Single    Spouse name: Not on file  . Number of children: 0  .  Years of education: Not on file  . Highest education level: Associate degree: occupational, Hotel manager, or vocational program  Occupational History  . Not on file  Social Needs  . Financial resource strain: Not very hard  . Food insecurity:    Worry: Never true    Inability: Never true  . Transportation needs:    Medical: No    Non-medical: No  Tobacco Use  . Smoking status: Never Smoker  . Smokeless tobacco: Never Used  Substance and Sexual Activity  . Alcohol use: Not Currently    Alcohol/week: 2.0 standard drinks    Types: 2 Standard drinks or equivalent per week    Comment: has not had any alcohol in a while  . Drug use: No  . Sexual activity: Yes     Partners: Female    Birth control/protection: Condom  Lifestyle  . Physical activity:    Days per week: 0 days    Minutes per session: 0 min  . Stress: Only a little  Relationships  . Social connections:    Talks on phone: More than three times a week    Gets together: More than three times a week    Attends religious service: More than 4 times per year    Active member of club or organization: No    Attends meetings of clubs or organizations: Never    Relationship status: Never married  . Intimate partner violence:    Fear of current or ex partner: No    Emotionally abused: No    Physically abused: No    Forced sexual activity: No  Other Topics Concern  . Not on file  Social History Narrative  . Not on file     Current Outpatient Medications:  .  amLODipine (NORVASC) 10 MG tablet, Take 1 tablet (10 mg total) by mouth daily., Disp: 30 tablet, Rfl: 0 .  atenolol (TENORMIN) 100 MG tablet, Take 1 tablet (100 mg total) by mouth daily., Disp: 6 tablet, Rfl: 0 .  B Complex-C-Folic Acid (RENA-VITE RX) 1 MG TABS, Take 1 tablet by mouth daily., Disp: , Rfl:  .  BRYHALI 0.01 % LOTN, Apply 1 each topically daily., Disp: , Rfl: 0 .  cloNIDine (CATAPRES) 0.1 MG tablet, Take 1 tablet (0.1 mg total) by mouth 2 (two) times daily. (Patient taking differently: Take 0.1 mg daily by mouth. ), Disp: 60 tablet, Rfl: 11 .  DESCOVY 200-25 MG tablet, Take 1 tablet by mouth daily., Disp: , Rfl:  .  doxycycline (VIBRA-TABS) 100 MG tablet, Take 1 tablet by mouth daily., Disp: , Rfl:  .  doxycycline (VIBRA-TABS) 100 MG tablet, Take 1 tablet by mouth daily., Disp: , Rfl:  .  doxycycline (VIBRAMYCIN) 100 MG capsule, Take 1 capsule by mouth 2 (two) times daily., Disp: , Rfl: 5 .  febuxostat (ULORIC) 40 MG tablet, Take 1 tablet by mouth daily., Disp: , Rfl:  .  ferrous sulfate 325 (65 FE) MG tablet, Take 1 tablet by mouth daily., Disp: , Rfl:  .  fluticasone (FLONASE) 50 MCG/ACT nasal spray, Place 1 spray into  both nostrils 2 (two) times daily., Disp: 16 g, Rfl: 0 .  furosemide (LASIX) 40 MG tablet, Take 40 mg by mouth daily. , Disp: , Rfl:  .  hydrOXYzine (ATARAX/VISTARIL) 25 MG tablet, Take 25 mg by mouth 2 (two) times daily as needed for itching. , Disp: , Rfl:  .  loratadine (CLARITIN) 10 MG tablet, Take 10 mg by mouth daily., Disp: ,  Rfl:  .  montelukast (SINGULAIR) 10 MG tablet, Take 10 mg by mouth daily., Disp: , Rfl: 3 .  TIVICAY 50 MG tablet, Take 1 tablet by mouth daily., Disp: , Rfl:  .  valACYclovir (VALTREX) 500 MG tablet, Take 500 mg by mouth daily as needed (for flare ups). , Disp: , Rfl:   Allergies  Allergen Reactions  . Quinapril Hcl Swelling    Face swells    I personally reviewed active problem list, medication list, allergies, family history, social history with the patient/caregiver today.   ROS  Constitutional: Negative for fever , positive for weight change.  Respiratory: Negative for cough and shortness of breath.   Cardiovascular: Negative for chest pain or palpitations.  Gastrointestinal: Negative for abdominal pain, no bowel changes.  Musculoskeletal: Negative for gait problem or joint swelling.  Skin: Negative for rash.  Neurological: Negative for dizziness or headache.  No other specific complaints in a complete review of systems (except as listed in HPI above).  Objective  Vitals:   01/04/18 1509  BP: (!) 142/82  Pulse: 72  Resp: 16  Temp: 98.2 F (36.8 C)  TempSrc: Oral  SpO2: 98%  Weight: 200 lb 14.4 oz (91.1 kg)  Height: 6\' 2"  (1.88 m)    Body mass index is 25.79 kg/m.  Physical Exam  Constitutional: Patient appears well-developed and well-nourished.  No distress.  HEENT: head atraumatic, normocephalic, pupils equal and reactive to light, neck supple, throat within normal limits Cardiovascular: Normal rate, regular rhythm and normal heart sounds.  No murmur heard. positive left lower extremity edema Muscular skeletal: normal rom of hip,  edema leg with large area of hematoma.  Pulmonary/Chest: Effort normal and breath sounds normal. No respiratory distress. Abdominal: Soft.  There is no tenderness.peritoneal lavage catheter present  Psychiatric: Patient has a normal mood and affect. behavior is normal. Judgment and thought content normal.  PHQ2/9: Depression screen [facility] 2/9 01/04/2018 10/05/2017 03/29/2016 03/16/2016 01/05/2015  Decreased Interest 0 0 0 0 0  Down, Depressed, Hopeless 0 0 0 0 0  PHQ - 2 Score 0 0 0 0 0  Altered sleeping 1 0 - - -  Tired, decreased energy 2 0 - - -  Change in appetite 0 0 - - -  Feeling bad or failure about yourself  0 0 - - -  Trouble concentrating 0 0 - - -  Moving slowly or fidgety/restless 0 0 - - -  Suicidal thoughts 0 0 - - -  PHQ-9 Score 3 0 - - -  Difficult doing work/chores Not difficult at all Not difficult at all - - -     Fall Risk: Fall Risk  01/04/2018 10/05/2017 03/29/2016 03/16/2016 01/05/2015  Falls in the past year? 0 No No Yes No  Number falls in past yr: - - - 1 -  Injury with Fall? - - - Yes -  Comment - - - winded -  Follow up - - - Falls evaluation completed -    Functional Status Survey: Is the patient deaf or have difficulty hearing?: No Does the patient have difficulty seeing, even when wearing glasses/contacts?: No Does the patient have difficulty concentrating, remembering, or making decisions?: No Does the patient have difficulty walking or climbing stairs?: No Does the patient have difficulty dressing or bathing?: No Does the patient have difficulty doing errands alone such as visiting a doctor's office or shopping?: No    Assessment & Plan  1. Human immunodeficiency virus (HIV) disease ([place])  Keep follow up with ID, we will try copy of results   2. ESRD on dialysis [facility])  Seeing nephrologist    3. Benign hypertension with ESRD (end-stage renal disease) ([place])  bp is okay today   4. Chronic gout due to renal impairment involving toe of right  foot without tophus  Taking uloric  5. Traumatic hematoma of left lower leg, initial encounter  Advised to keep leg elevated  6. Controlled gout  - predniSONE (DELTASONE) 10 MG tablet; Take 1 tablet (10 mg total) by mouth 2 (two) times daily as needed. Prn gout attack for 2 days  Dispense: 10 tablet; Refill: 0  7. Alcoholism in remission ([place])  Alcohol free in 2 years.   8. Rosacea  Seeing Dr. Phillip Heal  9. Perennial allergic rhinitis with seasonal variation  - cetirizine (ZYRTEC) 10 MG tablet; Take 1 tablet (10 mg total) by mouth daily.  Dispense: 30 tablet; Refill: 11

## 2018-01-16 ENCOUNTER — Ambulatory Visit: Admit: 2018-01-16 | Discharge: 2018-01-16 | Disposition: A | Discharge status: Home / Self Care | Payer: MEDICARE

## 2018-01-16 ENCOUNTER — Encounter: Admit: 2018-01-16 | Discharge: 2018-01-16 | Disposition: A | Discharge status: Home / Self Care | Payer: MEDICARE

## 2018-01-16 DIAGNOSIS — N186 End stage renal disease: Principal | ICD-10-CM

## 2018-01-16 DIAGNOSIS — M7989 Other specified soft tissue disorders: Secondary | ICD-10-CM

## 2018-01-27 ENCOUNTER — Ambulatory Visit
Admit: 2018-01-27 | Discharge: 2018-01-30 | Disposition: A | Discharge status: Home / Self Care | Payer: MEDICARE | Admitting: Internal Medicine

## 2018-01-27 ENCOUNTER — Encounter
Admit: 2018-01-27 | Discharge: 2018-01-30 | Disposition: A | Discharge status: Home / Self Care | Payer: MEDICARE | Admitting: Internal Medicine

## 2018-01-27 DIAGNOSIS — R0602 Shortness of breath: Principal | ICD-10-CM

## 2018-01-28 MED ORDER — DOLUTEGRAVIR SODIUM 50 MG PO TABS
50.00 | ORAL_TABLET | ORAL | Status: DC
Start: 2018-01-31 — End: 2018-01-28

## 2018-01-28 MED ORDER — FEBUXOSTAT 40 MG PO TABS
40.00 | ORAL_TABLET | ORAL | Status: DC
Start: 2018-01-31 — End: 2018-01-28

## 2018-01-28 MED ORDER — SEVELAMER CARBONATE 800 MG PO TABS
1600.00 | ORAL_TABLET | ORAL | Status: DC
Start: 2018-01-30 — End: 2018-01-28

## 2018-01-28 MED ORDER — ONDANSETRON 4 MG PO TBDP
4.00 | ORAL_TABLET | ORAL | Status: DC
Start: ? — End: 2018-01-28

## 2018-01-28 MED ORDER — HEPARIN SODIUM (PORCINE) 5000 UNIT/ML IJ SOLN
5000.00 | INTRAMUSCULAR | Status: DC
Start: 2018-01-30 — End: 2018-01-28

## 2018-01-28 MED ORDER — POLYETHYLENE GLYCOL 3350 17 G PO PACK
17.00 | PACK | ORAL | Status: DC
Start: ? — End: 2018-01-28

## 2018-01-28 MED ORDER — FLUTICASONE PROPIONATE 50 MCG/ACT NA SUSP
1.00 | NASAL | Status: DC
Start: 2018-01-31 — End: 2018-01-28

## 2018-01-28 MED ORDER — MELATONIN 3 MG PO TABS
3.00 | ORAL_TABLET | ORAL | Status: DC
Start: ? — End: 2018-01-28

## 2018-01-28 MED ORDER — ACETAMINOPHEN 325 MG PO TABS
650.00 | ORAL_TABLET | ORAL | Status: DC
Start: ? — End: 2018-01-28

## 2018-01-28 MED ORDER — EMTRICITABINE-TENOFOVIR AF 200-25 MG PO TABS
1.00 | ORAL_TABLET | ORAL | Status: DC
Start: 2018-01-31 — End: 2018-01-28

## 2018-01-28 MED ORDER — FERROUS SULFATE 325 (65 FE) MG PO TABS
325.00 | ORAL_TABLET | ORAL | Status: DC
Start: 2018-01-31 — End: 2018-01-28

## 2018-01-28 MED ORDER — GENERIC EXTERNAL MEDICATION
Status: DC
Start: ? — End: 2018-01-28

## 2018-01-28 MED ORDER — AMLODIPINE BESYLATE 10 MG PO TABS
10.00 | ORAL_TABLET | ORAL | Status: DC
Start: 2018-01-31 — End: 2018-01-28

## 2018-01-28 MED ORDER — GUAIFENESIN 100 MG/5ML PO SYRP
200.00 | ORAL_SOLUTION | ORAL | Status: DC
Start: ? — End: 2018-01-28

## 2018-01-28 MED ORDER — ATENOLOL 100 MG PO TABS
100.00 | ORAL_TABLET | ORAL | Status: DC
Start: 2018-01-29 — End: 2018-01-28

## 2018-01-28 MED ORDER — DOXYCYCLINE HYCLATE 100 MG PO TABS
100.00 | ORAL_TABLET | ORAL | Status: DC
Start: 2018-01-31 — End: 2018-01-28

## 2018-01-29 DIAGNOSIS — R0602 Shortness of breath: Principal | ICD-10-CM

## 2018-01-30 DIAGNOSIS — R0602 Shortness of breath: Principal | ICD-10-CM

## 2018-01-30 MED ORDER — ACETAMINOPHEN 325 MG PO TABS
650.00 | ORAL_TABLET | ORAL | Status: DC
Start: ? — End: 2018-01-30

## 2018-01-30 MED ORDER — DIPHENHYDRAMINE HCL 50 MG/ML IJ SOLN
25.00 | INTRAMUSCULAR | Status: DC
Start: ? — End: 2018-01-30

## 2018-01-30 MED ORDER — SODIUM CHLORIDE 0.9 % IV SOLN
250.00 | INTRAVENOUS | Status: DC
Start: ? — End: 2018-01-30

## 2018-01-30 MED ORDER — ALBUMIN HUMAN 25 % IV SOLN
25.00 | INTRAVENOUS | Status: DC
Start: ? — End: 2018-01-30

## 2018-01-30 MED ORDER — GENERIC EXTERNAL MEDICATION
Status: DC
Start: ? — End: 2018-01-30

## 2018-01-30 MED ORDER — EPOETIN ALFA-EPBX 4000 UNIT/ML IJ SOLN
4000.00 | INTRAMUSCULAR | Status: DC
Start: ? — End: 2018-01-30

## 2018-01-30 MED ORDER — GENERIC EXTERNAL MEDICATION
1.80 | Status: DC
Start: ? — End: 2018-01-30

## 2018-01-30 MED ORDER — CARVEDILOL 25 MG PO TABS
25.00 | ORAL_TABLET | ORAL | Status: DC
Start: 2018-01-30 — End: 2018-01-30

## 2018-01-30 MED ORDER — POLYETHYLENE GLYCOL 3350 17 G PO PACK
17.00 | PACK | ORAL | Status: DC
Start: ? — End: 2018-01-30

## 2018-01-30 MED ORDER — GENERIC EXTERNAL MEDICATION
1.90 | Status: DC
Start: ? — End: 2018-01-30

## 2018-01-30 MED ORDER — MAGNESIUM OXIDE 400 MG PO TABS
400.00 | ORAL_TABLET | ORAL | Status: DC
Start: 2018-01-30 — End: 2018-01-30

## 2018-01-30 MED ORDER — GENERIC EXTERNAL MEDICATION
1.00 | Status: DC
Start: ? — End: 2018-01-30

## 2018-01-30 MED ORDER — CARVEDILOL 25 MG TABLET
ORAL_TABLET | Freq: Two times a day (BID) | ORAL | 3 refills | 0 days | Status: CP
Start: 2018-01-30 — End: 2018-06-14

## 2018-01-30 MED ORDER — DOXYCYCLINE HYCLATE 100 MG CAPSULE
ORAL_CAPSULE | Freq: Every day | ORAL | 6 refills | 0 days | Status: CP
Start: 2018-01-30 — End: ?

## 2018-02-01 ENCOUNTER — Encounter
Admit: 2018-02-01 | Discharge: 2018-02-01 | Disposition: A | Discharge status: Home / Self Care | Payer: MEDICARE | Attending: Emergency Medicine

## 2018-02-08 ENCOUNTER — Inpatient Hospital Stay: Payer: PRIVATE HEALTH INSURANCE | Admitting: Family Medicine

## 2018-02-08 ENCOUNTER — Telehealth: Payer: Self-pay

## 2018-02-08 NOTE — Telephone Encounter (Signed)
Copied from [place] [id number]. Topic: General - Other >> Feb 08, 2018  9:19 AM Leward Quan A wrote: Reason for CRM: Patient called to cancel his appointment for today.  He stated that he is no longer taking any blood pressure medication except Carvedilol at this time.  He also said that he saw the doctor few weeks ago and did not need to come in today.

## 2018-02-11 ENCOUNTER — Ambulatory Visit: Payer: Self-pay

## 2018-02-11 NOTE — Telephone Encounter (Signed)
Returned call from Patient stating that he has cant take Carvedilol.  Causes headache.  Takes Ibuprofen the headaches which causes gout.  Patient inquires what does he need to do about blood pressure med.

## 2018-02-11 NOTE — Telephone Encounter (Signed)
Patient is calling waiting on response from from Dr.  Ancil Boozer, related to his blood Pressure medication.  States that he has stopped the medication of carvedilol due to the fact that it causes him to have a headache.  Patient reports that he takes Ibuprofen for the headaches but it causes his gout flare up. Patient request a phone call from Dr. Ruthine Dose to be updated on medications as to what medication will be prescribed as far as his blood pressure is concerned.

## 2018-02-12 NOTE — Telephone Encounter (Signed)
Patient needs to come in for an appointment - he has been admitted to the hospital since his last visit with Dr. Ancil Boozer in November.

## 2018-02-12 NOTE — Telephone Encounter (Signed)
Pt has appt 02/14/2018.

## 2018-02-12 NOTE — Telephone Encounter (Signed)
Patient will be here on Thursday @ 2 to see Raquel Sarna.

## 2018-02-12 NOTE — Telephone Encounter (Addendum)
He needs to be seen sooner and discuss bp and headaches. Nsai'ds can cause raising of bp, consider another agent for bp and try Elavil for prevention. He may be having rebound headache.

## 2018-02-14 ENCOUNTER — Ambulatory Visit: Payer: PRIVATE HEALTH INSURANCE | Admitting: Family Medicine

## 2018-02-14 ENCOUNTER — Encounter: Payer: Self-pay | Admitting: Family Medicine

## 2018-02-14 VITALS — BP 136/82 | HR 81 | Temp 98.5°F | Resp 16 | Ht 74.0 in | Wt 188.6 lb

## 2018-02-14 DIAGNOSIS — I10 Essential (primary) hypertension: Secondary | ICD-10-CM

## 2018-02-14 DIAGNOSIS — M109 Gout, unspecified: Secondary | ICD-10-CM

## 2018-02-14 MED ORDER — PREDNISONE 10 MG PO TABS: 10.0000 mg | ORAL_TABLET | Freq: Two times a day (BID) | ORAL | 0 refills | Status: DC | PRN

## 2018-02-14 NOTE — Progress Notes (Signed)
Name: Louis [name]   MRN: [id number]    DOB: April 09, 1960   Date:02/14/2018       Progress Note  Subjective  Chief Complaint  Chief Complaint  Patient presents with  . Follow-up  . Hypertension    Going to dialysis center and fluid has been going down. Has been having headaches and taking Ibuprofen.   . Gout    Prednisone for Gout flair up on Tuesday.    HPI   Pt presents to follow up on HTN: Had some elevated readings at Dialysis on Monday 02/11/2018 - he was given clonidine at dialysis and this brought his pressure down. He has also switched from Atenolol to carvedilol after hospital discharge on 02/01/2018.  Today BP is normal.  He denies headaches, chest pain, shortness of breath; his swelling is improved as he is taking lasix 80mg  PRN (Has taken once in the last 7 days).   - Pt attends hemodialysis MWF, but often skips Fridays. He attends [facility] dialysis.  - Reveiewed telephone notes from Dr. Ralph Dowdy on 02/11/2018 - advised to start ARB therapy if BP elevated as patient has had swelling on ACE-I in the past and to restart lasix.  We will hold off on initiating any additional medications for BP control as he is at goal today.  Gout: He has periodic flares of gout and takes prednisone PRN - would like refill today.  Patient Active Problem List   Diagnosis Date Noted  . Eczema 01/04/2018  . History of anemia due to CKD 05/24/2017  . ESRD on dialysis ([place]) 12/19/2016  . 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 Baker's cyst of knee, right 05/17/2016  . Neck muscle spasm 03/16/2016  . Hypertension 06/23/2010  . Rosacea 06/23/2010  . Human immunodeficiency virus (HIV) disease ([place]) 01/02/1996    Past Surgical History:  Procedure Laterality Date  . CAPD INSERTION N/A 12/28/2016   Procedure: LAPAROSCOPIC INSERTION CONTINUOUS AMBULATORY PERITONEAL DIALYSIS  (CAPD) CATHETER;  Surgeon: Algernon Huxley, MD;  Location: [facility] ORS;  Service: Vascular;  Laterality: N/A;    . DIALYSIS/PERMA CATHETER INSERTION N/A 10/09/2016   Procedure: DIALYSIS/PERMA CATHETER INSERTION;  Surgeon: Algernon Huxley, MD;  Location: [place] [facility];  Service: Cardiovascular;  Laterality: N/A;  . DIALYSIS/PERMA CATHETER REMOVAL N/A 02/19/2017   Procedure: DIALYSIS/PERMA CATHETER REMOVAL;  Surgeon: Algernon Huxley, MD;  Location: [facility];  Service: Cardiovascular;  Laterality: N/A;  . HERNIA REPAIR Bilateral    inguinal    Family History  Problem Relation Age of Onset  . Bradycardia Maternal Grandmother        with pacemaker    Social History   Socioeconomic History  . Marital status: Single    Spouse name: Not on file  . Number of children: 0  . Years of education: Not on file  . Highest education level: Associate degree: occupational, Hotel manager, or vocational program  Occupational History  . Not on file  Social Needs  . Financial resource strain: Not very hard  . Food insecurity:    Worry: Never true    Inability: Never true  . Transportation needs:    Medical: No    Non-medical: No  Tobacco Use  . Smoking status: Never Smoker  . Smokeless tobacco: Never Used  Substance and Sexual Activity  . Alcohol use: Not Currently    Alcohol/week: 2.0 standard drinks    Types: 2 Standard drinks or equivalent per week    Comment: has not  had any alcohol in a while  . Drug use: No  . Sexual activity: Yes    Partners: Female    Birth control/protection: Condom  Lifestyle  . Physical activity:    Days per week: 0 days    Minutes per session: 0 min  . Stress: Only a little  Relationships  . Social connections:    Talks on phone: More than three times a week    Gets together: More than three times a week    Attends religious service: More than 4 times per year    Active member of club or organization: No    Attends meetings of clubs or organizations: Never    Relationship status: Never married  . Intimate partner violence:    Fear of current or ex partner:  No    Emotionally abused: No    Physically abused: No    Forced sexual activity: No  Other Topics Concern  . Not on file  Social History Narrative  . Not on file     Current Outpatient Medications:  .  amLODipine (NORVASC) 10 MG tablet, Take 1 tablet (10 mg total) by mouth daily., Disp: 30 tablet, Rfl: 0 .  atenolol (TENORMIN) 100 MG tablet, Take 1 tablet (100 mg total) by mouth daily., Disp: 6 tablet, Rfl: 0 .  B Complex-C-Folic Acid (RENA-VITE RX) 1 MG TABS, Take 1 tablet by mouth daily., Disp: , Rfl:  .  BRYHALI 0.01 % LOTN, Apply 1 each topically daily., Disp: , Rfl: 0 .  carvedilol (COREG) 25 MG tablet, Take by mouth., Disp: , Rfl:  .  cetirizine (ZYRTEC) 10 MG tablet, Take 1 tablet (10 mg total) by mouth daily., Disp: 30 tablet, Rfl: 11 .  cloNIDine (CATAPRES) 0.1 MG tablet, Take 1 tablet (0.1 mg total) by mouth 2 (two) times daily. (Patient taking differently: Take 0.1 mg daily by mouth. ), Disp: 60 tablet, Rfl: 11 .  DESCOVY 200-25 MG tablet, Take 1 tablet by mouth daily., Disp: , Rfl:  .  doxycycline (VIBRAMYCIN) 100 MG capsule, Take 1 capsule by mouth 2 (two) times daily., Disp: , Rfl: 5 .  febuxostat (ULORIC) 40 MG tablet, Take 1 tablet by mouth daily., Disp: , Rfl:  .  ferrous sulfate 325 (65 FE) MG tablet, Take 1 tablet by mouth daily., Disp: , Rfl:  .  fluticasone (FLONASE) 50 MCG/ACT nasal spray, Place 1 spray into both nostrils 2 (two) times daily., Disp: 16 g, Rfl: 0 .  furosemide (LASIX) 40 MG tablet, Take 40 mg by mouth daily. , Disp: , Rfl:  .  hydrOXYzine (ATARAX/VISTARIL) 25 MG tablet, Take 25 mg by mouth 2 (two) times daily as needed for itching. , Disp: , Rfl:  .  montelukast (SINGULAIR) 10 MG tablet, Take 10 mg by mouth daily., Disp: , Rfl: 3 .  TIVICAY 50 MG tablet, Take 1 tablet by mouth daily., Disp: , Rfl:  .  valACYclovir (VALTREX) 500 MG tablet, Take 500 mg by mouth daily as needed (for flare ups). , Disp: , Rfl:  .  predniSONE (DELTASONE) 10 MG tablet,  Take 1 tablet (10 mg total) by mouth 2 (two) times daily as needed. Prn gout attack for 2 days, Disp: 10 tablet, Rfl: 0  Allergies  Allergen Reactions  . Quinapril Hcl Swelling    Face swells    I personally reviewed active problem list, medication list, allergies, notes from last encounter, lab results with the patient/caregiver today.   ROS Constitutional: Negative for  fever or weight change.  Respiratory: Negative for cough and shortness of breath.   Cardiovascular: Negative for chest pain or palpitations.  Gastrointestinal: Negative for abdominal pain, no bowel changes.  Musculoskeletal: Negative for gait problem or joint swelling.  Skin: Negative for rash.  Neurological: Negative for dizziness or headache.  No other specific complaints in a complete review of systems (except as listed in HPI above).  Objective  Vitals:   02/14/18 1415  BP: 136/82  Pulse: 81  Resp: 16  Temp: 98.5 F (36.9 C)  TempSrc: Oral  SpO2: 95%  Weight: 188 lb 9.6 oz (85.5 kg)  Height: 6\' 2"  (1.88 m)   Body mass index is 24.21 kg/m.  Physical Exam Constitutional: Patient appears well-developed and well-nourished. No distress.  HENT: Head: Normocephalic and atraumatic.  Eyes: Conjunctivae and EOM are normal. No scleral icterus. Neck: Normal range of motion. Neck supple. No JVD present. No thyromegaly present.  Cardiovascular: Normal rate, regular rhythm and normal heart sounds.  No murmur heard. No BLE edema. Pulmonary/Chest: Effort normal and breath sounds normal. No respiratory distress. Abdominal: Soft. Bowel sounds are normal, no distension. There is no tenderness. No masses. Musculoskeletal: Normal range of motion, no joint effusions. No gross deformities Neurological: Pt is alert and oriented to person, place, and time. No cranial nerve deficit. Coordination, balance, strength, speech and gait are normal.  Skin: Skin is warm and dry. No rash noted. No erythema.  Psychiatric: Patient has  a normal mood and affect. behavior is normal. Judgment and thought content normal.  No results found for this or any previous visit (from the past 72 hour(s)).  PHQ2/9: Depression screen [facility] 2/9 01/04/2018 10/05/2017 03/29/2016 03/16/2016 01/05/2015  Decreased Interest 0 0 0 0 0  Down, Depressed, Hopeless 0 0 0 0 0  PHQ - 2 Score 0 0 0 0 0  Altered sleeping 1 0 - - -  Tired, decreased energy 2 0 - - -  Change in appetite 0 0 - - -  Feeling bad or failure about yourself  0 0 - - -  Trouble concentrating 0 0 - - -  Moving slowly or fidgety/restless 0 0 - - -  Suicidal thoughts 0 0 - - -  PHQ-9 Score 3 0 - - -  Difficult doing work/chores Not difficult at all Not difficult at all - - -   Fall Risk: Fall Risk  01/04/2018 10/05/2017 03/29/2016 03/16/2016 01/05/2015  Falls in the past year? 0 No No Yes No  Number falls in past yr: - - - 1 -  Injury with Fall? - - - Yes -  Comment - - - winded -  Follow up - - - Falls evaluation completed -   Assessment & Plan  1. Essential hypertension BP is stable today, he is asymptomatic.  We will maintain course of medicationa t this time and hold off on adding ARB therapy.  Reviewed signs and symptoms of stroke in detail.  2. Controlled gout - predniSONE (DELTASONE) 10 MG tablet; Take 1 tablet (10 mg total) by mouth 2 (two) times daily as needed. Prn gout attack for 2 days  Dispense: 10 tablet; Refill: 0

## 2018-02-20 ENCOUNTER — Telehealth: Payer: Self-pay | Admitting: Family Medicine

## 2018-02-20 NOTE — Telephone Encounter (Signed)
Copied from [place] [id number]. Topic: Quick Communication - Rx Refill/Question >> Feb 20, 2018 12:07 PM Reyne Dumas L wrote: Medication: Blood pressure medication  Pt calling to report that he stopped his blood pressure medications "that starts with a c" that he was just given because it wasn't keeping his blood pressure down. Pt states that he just went back to taking amLODipine (NORVASC) 10 MG tablet and atenolol (TENORMIN) 100 MG tablet.   Pt states that he changed this on Monday and just wanted to report it.  Pt can be reached at [phone]

## 2018-02-21 NOTE — Telephone Encounter (Signed)
Please call to clairfy - did he stop carvedilol and go back to atenolol? Or did he stop clonidine?  Please advise he should always consult our clinic before changing his medications. He needs to come in for BP check in 1 week to ensure the change he made is effective.

## 2018-02-22 NOTE — Telephone Encounter (Signed)
Left message for patient to call office.  

## 2018-03-02 DIAGNOSIS — I120 Hypertensive chronic kidney disease with stage 5 chronic kidney disease or end stage renal disease: Principal | ICD-10-CM

## 2018-03-05 ENCOUNTER — Encounter
Admit: 2018-03-05 | Discharge: 2018-03-05 | Payer: MEDICARE | Attending: Student in an Organized Health Care Education/Training Program | Primary: Student in an Organized Health Care Education/Training Program

## 2018-03-05 ENCOUNTER — Ambulatory Visit: Admit: 2018-03-05 | Discharge: 2018-03-05 | Payer: MEDICARE

## 2018-03-05 DIAGNOSIS — I120 Hypertensive chronic kidney disease with stage 5 chronic kidney disease or end stage renal disease: Principal | ICD-10-CM

## 2018-03-05 MED ORDER — OXYCODONE 5 MG TABLET
ORAL_TABLET | ORAL | 0 refills | 0.00000 days | Status: CP | PRN
Start: 2018-03-05 — End: 2018-06-14

## 2018-03-28 ENCOUNTER — Encounter: Admit: 2018-03-28 | Discharge: 2018-03-29 | Payer: MEDICARE

## 2018-03-28 DIAGNOSIS — Z4802 Encounter for removal of sutures: Secondary | ICD-10-CM

## 2018-03-28 DIAGNOSIS — Z992 Dependence on renal dialysis: Secondary | ICD-10-CM

## 2018-03-28 DIAGNOSIS — S31109A Unspecified open wound of abdominal wall, unspecified quadrant without penetration into peritoneal cavity, initial encounter: Secondary | ICD-10-CM

## 2018-03-28 DIAGNOSIS — Z9889 Other specified postprocedural states: Secondary | ICD-10-CM

## 2018-03-28 DIAGNOSIS — Z21 Asymptomatic human immunodeficiency virus [HIV] infection status: Secondary | ICD-10-CM

## 2018-03-28 DIAGNOSIS — I770 Arteriovenous fistula, acquired: Principal | ICD-10-CM

## 2018-03-28 DIAGNOSIS — I10 Essential (primary) hypertension: Secondary | ICD-10-CM

## 2018-03-28 DIAGNOSIS — N186 End stage renal disease: Secondary | ICD-10-CM

## 2018-03-29 ENCOUNTER — Telehealth: Payer: Self-pay | Admitting: Family Medicine

## 2018-03-29 DIAGNOSIS — M109 Gout, unspecified: Secondary | ICD-10-CM

## 2018-03-29 MED ORDER — PREDNISONE 10 MG PO TABS: 10.0000 mg | ORAL_TABLET | Freq: Two times a day (BID) | ORAL | 0 refills | Status: DC | PRN

## 2018-03-29 NOTE — Telephone Encounter (Signed)
Copied from [place] [id number]. Topic: Quick Communication - Rx Refill/Question >> Mar 29, 2018  1:50 PM Windy Kalata wrote: Medication: predniSONE (DELTASONE) 10 MG   Has the patient contacted their pharmacy? No. (Agent: If no, request that the patient contact the pharmacy for the refill.) (Agent: If yes, when and what did the pharmacy advise?)  Preferred Pharmacy (with phone number or street name): [id number] - [place], [phone] (Phone) [phone] (Fax)    Agent: Please be advised that RX refills may take up to 3 business days. We ask that you follow-up with your pharmacy.

## 2018-03-29 NOTE — Telephone Encounter (Signed)
Routing to [employer]. [facility] not filling prescriptions for this practice at this time.

## 2018-03-29 NOTE — Addendum Note (Signed)
Addended by: Steele Sizer F on: 03/29/2018 04:54 PM   Modules accepted: Orders

## 2018-04-04 ENCOUNTER — Encounter: Admit: 2018-04-04 | Discharge: 2018-04-05 | Payer: MEDICARE

## 2018-04-04 DIAGNOSIS — S31109D Unspecified open wound of abdominal wall, unspecified quadrant without penetration into peritoneal cavity, subsequent encounter: Secondary | ICD-10-CM

## 2018-04-04 DIAGNOSIS — I770 Arteriovenous fistula, acquired: Principal | ICD-10-CM

## 2018-04-04 DIAGNOSIS — N186 End stage renal disease: Secondary | ICD-10-CM

## 2018-04-09 ENCOUNTER — Other Ambulatory Visit: Payer: Self-pay | Admitting: Family Medicine

## 2018-04-09 DIAGNOSIS — I10 Essential (primary) hypertension: Secondary | ICD-10-CM

## 2018-04-09 NOTE — Telephone Encounter (Signed)
Refill request for Hypertension medication:  Atenolol 100 mg  Last office visit pertaining to hypertension: 02/14/2018  BP Readings from Last 3 Encounters:  02/14/18 136/82  01/04/18 (!) 142/82  10/05/17 (!) 164/88     Lab Results  Component Value Date   CREATININE 13.43 (H) 06/01/2017   BUN 98 (H) 06/01/2017   NA 136 06/01/2017   K 3.8 06/01/2017   CL 101 06/01/2017   CO2 22 06/01/2017   Follow-ups on file. Tomorrow

## 2018-04-10 ENCOUNTER — Ambulatory Visit: Payer: PRIVATE HEALTH INSURANCE | Admitting: Family Medicine

## 2018-04-22 ENCOUNTER — Telehealth: Payer: Self-pay | Admitting: Family Medicine

## 2018-04-22 NOTE — Telephone Encounter (Signed)
He will need to wait until his follow up, or ask the rx who filled for him the first time

## 2018-04-22 NOTE — Telephone Encounter (Signed)
Copied from [place] [id number]. Topic: Quick Communication - Rx Refill/Question >> Apr 22, 2018  9:09 AM Ahmed Prima L wrote: Medication: methocarbamol (ROBAXIN) 500 MG tablet [[id number]]  DISCONTINUED   Has the patient contacted their pharmacy? yes (Agent: If no, request that the patient contact the pharmacy for the refill.) (Agent: If yes, when and what did the pharmacy advise?)  Preferred Pharmacy (with phone number or street name): [id number] - [place], [place] [address]  Agent: Please be advised that RX refills may take up to 3 business days. We ask that you follow-up with your pharmacy.

## 2018-04-22 NOTE — Telephone Encounter (Signed)
The medication he is requesting refill for is no longer on his medication list. It was prescribed by an outside physician and discontinued. Please advise.

## 2018-04-23 NOTE — Telephone Encounter (Signed)
Called patient to inform him about the refill request of Robaxin, there was no answer, left a VM informing him to try and contact physician that Rx it to him or wait until his appointment for refills.

## 2018-05-01 ENCOUNTER — Ambulatory Visit (INDEPENDENT_AMBULATORY_CARE_PROVIDER_SITE_OTHER): Payer: PRIVATE HEALTH INSURANCE | Admitting: Family Medicine

## 2018-05-01 ENCOUNTER — Ambulatory Visit
Admission: RE | Admit: 2018-05-01 | Discharge: 2018-05-01 | Disposition: A | Discharge status: Home / Self Care | Payer: PRIVATE HEALTH INSURANCE | Attending: Family Medicine | Admitting: Family Medicine

## 2018-05-01 ENCOUNTER — Encounter: Payer: Self-pay | Admitting: Family Medicine

## 2018-05-01 ENCOUNTER — Other Ambulatory Visit: Payer: Self-pay

## 2018-05-01 ENCOUNTER — Ambulatory Visit
Admission: RE | Admit: 2018-05-01 | Discharge: 2018-05-01 | Disposition: A | Discharge status: Home / Self Care | Payer: PRIVATE HEALTH INSURANCE | Source: Ambulatory Visit | Attending: Family Medicine | Admitting: Family Medicine

## 2018-05-01 VITALS — BP 180/100 | HR 91 | Temp 98.7°F | Ht 74.0 in | Wt 191.2 lb

## 2018-05-01 DIAGNOSIS — R05 Cough: Secondary | ICD-10-CM | POA: Insufficient documentation

## 2018-05-01 DIAGNOSIS — R059 Cough, unspecified: Secondary | ICD-10-CM

## 2018-05-01 DIAGNOSIS — R0602 Shortness of breath: Secondary | ICD-10-CM

## 2018-05-01 DIAGNOSIS — B20 Human immunodeficiency virus [HIV] disease: Secondary | ICD-10-CM | POA: Diagnosis not present

## 2018-05-01 DIAGNOSIS — R0989 Other specified symptoms and signs involving the circulatory and respiratory systems: Secondary | ICD-10-CM | POA: Diagnosis present

## 2018-05-01 DIAGNOSIS — E441 Mild protein-calorie malnutrition: Secondary | ICD-10-CM | POA: Insufficient documentation

## 2018-05-01 DIAGNOSIS — Z992 Dependence on renal dialysis: Secondary | ICD-10-CM

## 2018-05-01 DIAGNOSIS — N186 End stage renal disease: Secondary | ICD-10-CM | POA: Diagnosis not present

## 2018-05-01 MED ORDER — CEFTRIAXONE SODIUM 1 G IJ SOLR
1.0000 g | Freq: Once | INTRAMUSCULAR | Status: AC
  Administered 2018-05-01: 1 g via INTRAMUSCULAR

## 2018-05-01 MED ORDER — LIDOCAINE HCL (PF) 1 % IJ SOLN
2.0000 mL | Freq: Once | INTRAMUSCULAR | Status: AC
  Administered 2018-05-01: 2 mL

## 2018-05-01 NOTE — Progress Notes (Signed)
Name: Louis [name]   MRN: [id number]    DOB: July 31, 1960   Date:05/01/2018       Progress Note  Subjective  Chief Complaint  Chief Complaint  Patient presents with  . Cough  . Fever  . Shortness of Breath  . Abdominal Pain    From coughing    HPI  Cough/SOB: he is a high risk patient, he has HIV, ESRD, and recently had a procedure done at [facility] to get vascular access on left arm. He states 4 days ago he woke up with a dry cough, no cold symptoms such as rhinorrhea, nasal congestion or sore throat. He states cough is intense and it causes abdominal pain ( coughing so hard) also noticed a decrease in appetite, subjective fever and also nausea - that is why he did not take medications this am. He is not a smoker. He is concerned about COVID-19 but he denies travelling to high risk areas or exposure to anyone that has been diagnosed with COVID-19 He has orthopnea and cough is worse when laying down  HTN uncontrolled: he did not take any of his bp medications this am because of nausea, bp is usually elevated based on last bp done at [facility]'s. Advised to take with a sip of water to keep bp down.    Patient Active Problem List   Diagnosis Date Noted  . Mild protein-calorie malnutrition ([place]) 05/01/2018  . Eczema 01/04/2018  . History of anemia due to CKD 05/24/2017  . ESRD on dialysis ([place]) 12/19/2016  . 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 Baker's cyst of knee, right 05/17/2016  . Neck muscle spasm 03/16/2016  . Hypertension 06/23/2010  . Rosacea 06/23/2010  . Human immunodeficiency virus (HIV) disease ([place]) 01/02/1996    Past Surgical History:  Procedure Laterality Date  . CAPD INSERTION N/A 12/28/2016   Procedure: LAPAROSCOPIC INSERTION CONTINUOUS AMBULATORY PERITONEAL DIALYSIS  (CAPD) CATHETER;  Surgeon: Algernon Huxley, MD;  Location: [facility] ORS;  Service: Vascular;  Laterality: N/A;  . DIALYSIS/PERMA CATHETER INSERTION N/A 10/09/2016   Procedure: DIALYSIS/PERMA  CATHETER INSERTION;  Surgeon: Algernon Huxley, MD;  Location: [place] [facility];  Service: Cardiovascular;  Laterality: N/A;  . DIALYSIS/PERMA CATHETER REMOVAL N/A 02/19/2017   Procedure: DIALYSIS/PERMA CATHETER REMOVAL;  Surgeon: Algernon Huxley, MD;  Location: [place] [facility];  Service: Cardiovascular;  Laterality: N/A;  . HERNIA REPAIR Bilateral    inguinal    Family History  Problem Relation Age of Onset  . Bradycardia Maternal Grandmother        with pacemaker    Social History   Socioeconomic History  . Marital status: Single    Spouse name: Not on file  . Number of children: 0  . Years of education: Not on file  . Highest education level: Associate degree: occupational, Hotel manager, or vocational program  Occupational History  . Not on file  Social Needs  . Financial resource strain: Not very hard  . Food insecurity:    Worry: Never true    Inability: Never true  . Transportation needs:    Medical: No    Non-medical: No  Tobacco Use  . Smoking status: Never Smoker  . Smokeless tobacco: Never Used  Substance and Sexual Activity  . Alcohol use: Not Currently    Alcohol/week: 2.0 standard drinks    Types: 2 Standard drinks or equivalent per week    Comment: has not had any alcohol in a while  . Drug  use: No  . Sexual activity: Yes    Partners: Female    Birth control/protection: Condom  Lifestyle  . Physical activity:    Days per week: 0 days    Minutes per session: 0 min  . Stress: Only a little  Relationships  . Social connections:    Talks on phone: More than three times a week    Gets together: More than three times a week    Attends religious service: More than 4 times per year    Active member of club or organization: No    Attends meetings of clubs or organizations: Never    Relationship status: Never married  . Intimate partner violence:    Fear of current or ex partner: No    Emotionally abused: No    Physically abused: No    Forced sexual  activity: No  Other Topics Concern  . Not on file  Social History Narrative  . Not on file     Current Outpatient Medications:  .  amLODipine (NORVASC) 10 MG tablet, Take 1 tablet (10 mg total) by mouth daily., Disp: 30 tablet, Rfl: 0 .  B Complex-C-Folic Acid (RENA-VITE RX) 1 MG TABS, Take 1 tablet by mouth daily., Disp: , Rfl:  .  BRYHALI 0.01 % LOTN, Apply 1 each topically daily., Disp: , Rfl: 0 .  cetirizine (ZYRTEC) 10 MG tablet, Take 1 tablet (10 mg total) by mouth daily., Disp: 30 tablet, Rfl: 11 .  DESCOVY 200-25 MG tablet, Take 1 tablet by mouth daily., Disp: , Rfl:  .  doxycycline (VIBRAMYCIN) 100 MG capsule, Take 1 capsule by mouth 2 (two) times daily., Disp: , Rfl: 5 .  ferrous sulfate 325 (65 FE) MG tablet, Take 1 tablet by mouth daily., Disp: , Rfl:  .  furosemide (LASIX) 40 MG tablet, Take 40 mg by mouth daily. , Disp: , Rfl:  .  hydrOXYzine (ATARAX/VISTARIL) 25 MG tablet, Take 25 mg by mouth 2 (two) times daily as needed for itching. , Disp: , Rfl:  .  montelukast (SINGULAIR) 10 MG tablet, Take 10 mg by mouth daily., Disp: , Rfl: 3 .  predniSONE (DELTASONE) 10 MG tablet, Take 1 tablet (10 mg total) by mouth 2 (two) times daily as needed. Prn gout attack for 2 days, Disp: 10 tablet, Rfl: 0 .  TIVICAY 50 MG tablet, Take 1 tablet by mouth daily., Disp: , Rfl:  .  valACYclovir (VALTREX) 500 MG tablet, Take 500 mg by mouth daily as needed (for flare ups). , Disp: , Rfl:  .  atenolol (TENORMIN) 100 MG tablet, Take 1 tablet by mouth daily., Disp: , Rfl:  .  AURYXIA 1 GM 210 MG(Fe) tablet, Take 1 tablet by mouth 4 (four) times daily -  before meals and at bedtime., Disp: , Rfl:  .  febuxostat (ULORIC) 40 MG tablet, Take 1 tablet by mouth daily., Disp: , Rfl:  .  methocarbamol (ROBAXIN) 500 MG tablet, Take 500 mg by mouth 2 (two) times daily as needed. for pain, Disp: , Rfl:   Allergies  Allergen Reactions  . Quinapril Hcl Swelling    Face swells    I personally reviewed  active problem list, medication list, allergies, family history, social history with the patient/caregiver today.   ROS  Constitutional: Negative for fever or weight change.  Respiratory: positive  for cough and shortness of breath.   Cardiovascular: Negative for chest pain ( only when he cough)  or palpitations.  Gastrointestinal: Negative for  abdominal pain, no bowel changes.  Musculoskeletal: Negative for gait problem or joint swelling.  Skin: Negative for rash.  Neurological: Negative for dizziness or headache.  No other specific complaints in a complete review of systems (except as listed in HPI above).  Objective  Vitals:   05/01/18 1042  BP: (!) 180/100  Pulse: 91  Temp: 98.7 F (37.1 C)  TempSrc: Oral  SpO2: 91%  Weight: 191 lb 3.2 oz (86.7 kg)  Height: 6\' 2"  (1.88 m)    Body mass index is 24.55 kg/m.  Physical Exam  Constitutional: Patient appears well-developed and well-nourished. Obese No distress.  HEENT: head atraumatic, normocephalic, pupils equal and reactive to light, ears normal TM bilaterally , neck supple, throat within normal limits, clear rhinorrhea  Cardiovascular: Normal rate, regular rhythm and normal heart sounds.  No murmur heard. No BLE edema. Pulmonary/Chest: Effort normal , crackles on both bases to mid lung, he missed HD on Monday, needs to take lasix Abdominal: Soft.  There is no tenderness. Psychiatric: Patient has a normal mood and affect. behavior is normal. Judgment and thought content normal.   PHQ2/9: Depression screen [facility] 2/9 05/01/2018 01/04/2018 10/05/2017 03/29/2016 03/16/2016  Decreased Interest 0 0 0 0 0  Down, Depressed, Hopeless 0 0 0 0 0  PHQ - 2 Score 0 0 0 0 0  Altered sleeping 0 1 0 - -  Tired, decreased energy 0 2 0 - -  Change in appetite 0 0 0 - -  Feeling bad or failure about yourself  0 0 0 - -  Trouble concentrating 0 0 0 - -  Moving slowly or fidgety/restless 0 0 0 - -  Suicidal thoughts 0 0 0 - -  PHQ-9 Score 0 3  0 - -  Difficult doing work/chores - Not difficult at all Not difficult at all - -     Fall Risk: Fall Risk  01/04/2018 10/05/2017 03/29/2016 03/16/2016 01/05/2015  Falls in the past year? 0 No No Yes No  Number falls in past yr: - - - 1 -  Injury with Fall? - - - Yes -  Comment - - - winded -  Follow up - - - Falls evaluation completed -     Assessment & Plan  1. Cough  - B Nat Peptide - CBC with Differential/Platelet - DG Chest 2 View; Future - cefTRIAXone (ROCEPHIN) injection 1 g  2. Mild protein-calorie malnutrition ([place])  stable  3. ESRD on dialysis [facility])  Sees Dr. Smith Mince getting a fistula to , he skipped HD this week because he was sick on Monday   4. Human immunodeficiency virus (HIV) disease ([place])  Keep follow up with ID - he cannot recall her name  5. SOB (shortness of breath)  - B Nat Peptide - CBC with Differential/Platelet - DG Chest 2 View; Future - cefTRIAXone (ROCEPHIN) injection 1 g  6. Respiratory crackles at both lung bases  - B Nat Peptide - CBC with Differential/Platelet - DG Chest 2 View; Future

## 2018-05-02 ENCOUNTER — Encounter: Payer: Self-pay | Admitting: Family Medicine

## 2018-05-02 ENCOUNTER — Ambulatory Visit (INDEPENDENT_AMBULATORY_CARE_PROVIDER_SITE_OTHER): Payer: PRIVATE HEALTH INSURANCE | Admitting: Family Medicine

## 2018-05-02 VITALS — BP 160/80 | HR 70 | Temp 98.4°F | Resp 18 | Ht 74.0 in | Wt 186.9 lb

## 2018-05-02 DIAGNOSIS — B20 Human immunodeficiency virus [HIV] disease: Secondary | ICD-10-CM

## 2018-05-02 DIAGNOSIS — Z992 Dependence on renal dialysis: Secondary | ICD-10-CM | POA: Diagnosis not present

## 2018-05-02 DIAGNOSIS — N186 End stage renal disease: Secondary | ICD-10-CM

## 2018-05-02 DIAGNOSIS — J189 Pneumonia, unspecified organism: Secondary | ICD-10-CM

## 2018-05-02 LAB — CBC WITH DIFFERENTIAL/PLATELET
Absolute Monocytes: 868 cells/uL (ref 200–950)
Basophils Absolute: 19 cells/uL (ref 0–200)
Basophils Relative: 0.3 %
Eosinophils Absolute: 93 cells/uL (ref 15–500)
Eosinophils Relative: 1.5 %
HCT: 31.3 % — ABNORMAL LOW (ref 38.5–50.0)
Hemoglobin: 10.8 g/dL — ABNORMAL LOW (ref 13.2–17.1)
Lymphs Abs: 701 cells/uL — ABNORMAL LOW (ref 850–3900)
MCH: 34.7 pg — ABNORMAL HIGH (ref 27.0–33.0)
MCHC: 34.5 g/dL (ref 32.0–36.0)
MCV: 100.6 fL — ABNORMAL HIGH (ref 80.0–100.0)
MPV: 10.7 fL (ref 7.5–12.5)
Monocytes Relative: 14 %
Neutro Abs: 4520 cells/uL (ref 1500–7800)
Neutrophils Relative %: 72.9 %
Platelets: 169 10*3/uL (ref 140–400)
RBC: 3.11 10*6/uL — ABNORMAL LOW (ref 4.20–5.80)
RDW: 15.7 % — ABNORMAL HIGH (ref 11.0–15.0)
Total Lymphocyte: 11.3 %
WBC: 6.2 10*3/uL (ref 3.8–10.8)

## 2018-05-02 LAB — BRAIN NATRIURETIC PEPTIDE: Brain Natriuretic Peptide: 2358 pg/mL — ABNORMAL HIGH (ref <100)

## 2018-05-02 MED ORDER — AZITHROMYCIN 250 MG PO TABS: ORAL_TABLET | ORAL | 0 refills | Status: DC

## 2018-05-02 MED ORDER — CEFDINIR 300 MG PO CAPS: 300.0000 mg | ORAL_CAPSULE | ORAL | 0 refills | Status: DC

## 2018-05-02 NOTE — Patient Instructions (Signed)
Cefdinir:  Take on: 05/02/2018                05/04/2018                05/05/2018 after HD                05/07/2018 after HD                05/09/2018                 05/11/2018                 05/12/2018

## 2018-05-02 NOTE — Progress Notes (Signed)
Name: Louis [name]   MRN: [id number]    DOB: 1960/04/08   Date:05/02/2018       Progress Note  Subjective  Chief Complaint  Chief Complaint  Patient presents with  . Cough    1 day follow up    HPI  Louis [name] was seen in our office yesterday with increase in SOB and cough, he had a CXR that showed bilateral patchy pneumonia. He has ESRD and is on HD, went again this am. He was given Rocephin yesterday in our office. Today he states he is feeling better but still has cough and SOB, denies fever or change in appetite. Explained that we need to adjust medication because of renal disease. He was also advised to go to [facility] if worsening of symptoms since he is a high risk patient with HIV, ESRD. He will follow up in one week with [place].   Patient Active Problem List   Diagnosis Date Noted  . Mild protein-calorie malnutrition ([place]) 05/01/2018  . Eczema 01/04/2018  . History of anemia due to CKD 05/24/2017  . ESRD on dialysis ([place]) 12/19/2016  . 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 Baker's cyst of knee, right 05/17/2016  . Neck muscle spasm 03/16/2016  . Hypertension 06/23/2010  . Rosacea 06/23/2010  . Human immunodeficiency virus (HIV) disease ([place]) 01/02/1996    Past Surgical History:  Procedure Laterality Date  . CAPD INSERTION N/A 12/28/2016   Procedure: LAPAROSCOPIC INSERTION CONTINUOUS AMBULATORY PERITONEAL DIALYSIS  (CAPD) CATHETER;  Surgeon: Algernon Huxley, MD;  Location: [facility] ORS;  Service: Vascular;  Laterality: N/A;  . DIALYSIS/PERMA CATHETER INSERTION N/A 10/09/2016   Procedure: DIALYSIS/PERMA CATHETER INSERTION;  Surgeon: Algernon Huxley, MD;  Location: [place] [facility];  Service: Cardiovascular;  Laterality: N/A;  . DIALYSIS/PERMA CATHETER REMOVAL N/A 02/19/2017   Procedure: DIALYSIS/PERMA CATHETER REMOVAL;  Surgeon: Algernon Huxley, MD;  Location: [place] [facility];  Service: Cardiovascular;  Laterality: N/A;  . HERNIA REPAIR Bilateral    inguinal     Family History  Problem Relation Age of Onset  . Bradycardia Maternal Grandmother        with pacemaker    Social History   Socioeconomic History  . Marital status: Single    Spouse name: Not on file  . Number of children: 0  . Years of education: Not on file  . Highest education level: Associate degree: occupational, Hotel manager, or vocational program  Occupational History  . Not on file  Social Needs  . Financial resource strain: Not very hard  . Food insecurity:    Worry: Never true    Inability: Never true  . Transportation needs:    Medical: No    Non-medical: No  Tobacco Use  . Smoking status: Never Smoker  . Smokeless tobacco: Never Used  Substance and Sexual Activity  . Alcohol use: Not Currently    Alcohol/week: 2.0 standard drinks    Types: 2 Standard drinks or equivalent per week    Comment: has not had any alcohol in a while  . Drug use: No  . Sexual activity: Yes    Partners: Female    Birth control/protection: Condom  Lifestyle  . Physical activity:    Days per week: 0 days    Minutes per session: 0 min  . Stress: Only a little  Relationships  . Social connections:    Talks on phone: More than three times a week    Gets together: More than three  times a week    Attends religious service: More than 4 times per year    Active member of club or organization: No    Attends meetings of clubs or organizations: Never    Relationship status: Never married  . Intimate partner violence:    Fear of current or ex partner: No    Emotionally abused: No    Physically abused: No    Forced sexual activity: No  Other Topics Concern  . Not on file  Social History Narrative  . Not on file     Current Outpatient Medications:  .  amLODipine (NORVASC) 10 MG tablet, Take 1 tablet (10 mg total) by mouth daily., Disp: 30 tablet, Rfl: 0 .  atenolol (TENORMIN) 100 MG tablet, Take 1 tablet by mouth daily., Disp: , Rfl:  .  AURYXIA 1 GM 210 MG(Fe) tablet, Take 1  tablet by mouth 4 (four) times daily -  before meals and at bedtime., Disp: , Rfl:  .  B Complex-C-Folic Acid (RENA-VITE RX) 1 MG TABS, Take 1 tablet by mouth daily., Disp: , Rfl:  .  BRYHALI 0.01 % LOTN, Apply 1 each topically daily., Disp: , Rfl: 0 .  cetirizine (ZYRTEC) 10 MG tablet, Take 1 tablet (10 mg total) by mouth daily., Disp: 30 tablet, Rfl: 11 .  DESCOVY 200-25 MG tablet, Take 1 tablet by mouth daily., Disp: , Rfl:  .  doxycycline (VIBRAMYCIN) 100 MG capsule, Take 1 capsule by mouth 2 (two) times daily., Disp: , Rfl: 5 .  febuxostat (ULORIC) 40 MG tablet, Take 1 tablet by mouth daily., Disp: , Rfl:  .  ferrous sulfate 325 (65 FE) MG tablet, Take 1 tablet by mouth daily., Disp: , Rfl:  .  furosemide (LASIX) 40 MG tablet, Take 40 mg by mouth daily. , Disp: , Rfl:  .  hydrOXYzine (ATARAX/VISTARIL) 25 MG tablet, Take 25 mg by mouth 2 (two) times daily as needed for itching. , Disp: , Rfl:  .  methocarbamol (ROBAXIN) 500 MG tablet, Take 500 mg by mouth 2 (two) times daily as needed. for pain, Disp: , Rfl:  .  montelukast (SINGULAIR) 10 MG tablet, Take 10 mg by mouth daily., Disp: , Rfl: 3 .  predniSONE (DELTASONE) 10 MG tablet, Take 1 tablet (10 mg total) by mouth 2 (two) times daily as needed. Prn gout attack for 2 days, Disp: 10 tablet, Rfl: 0 .  TIVICAY 50 MG tablet, Take 1 tablet by mouth daily., Disp: , Rfl:  .  valACYclovir (VALTREX) 500 MG tablet, Take 500 mg by mouth daily as needed (for flare ups). , Disp: , Rfl:   Allergies  Allergen Reactions  . Quinapril Hcl Swelling    Face swells    I personally reviewed active problem list, medication list, allergies, family history, social history with the patient/caregiver today.   ROS  Ten systems reviewed and is negative except as mentioned in HPI   Objective  Vitals:   05/02/18 1055  BP: (!) 160/80  Pulse: 70  Resp: 16  SpO2: 93%  Weight: 186 lb 14.4 oz (84.8 kg)  Height: 6\' 2"  (1.88 m)    Body mass index is 24  kg/m.  Physical Exam  Constitutional: Patient appears well-developed and well-nourished.  No distress.  HEENT: head atraumatic, normocephalic, pupils equal and reactive to light, neck supple, throat within normal limits Cardiovascular: Normal rate, regular rhythm and normal heart sounds.  No murmur heard. No BLE edema. Pulmonary/Chest: Effort normal and breath sounds  improved, mild rhonchi, and crackles coarse and only on base and mild compared to yesterday.  Abdominal: Soft.  There is no tenderness. Psychiatric: Patient has a normal mood and affect. behavior is normal. Judgment and thought content normal.  Recent Results (from the past 2160 hour(s))  B Nat Peptide     Status: Abnormal   Collection Time: 05/01/18 11:20 AM  Result Value Ref Range   Brain Natriuretic Peptide 2,358 (H) <100 pg/mL    Comment: . BNP levels increase with age in the general population with the highest values seen in individuals greater than 8 years of age. Reference: J. Am. [place] Ar. Cardiol. 2002; 13:244-010. Marland Kitchen   CBC with Differential/Platelet     Status: Abnormal   Collection Time: 05/01/18 11:20 AM  Result Value Ref Range   WBC 6.2 3.8 - 10.8 Thousand/uL   RBC 3.11 (L) 4.20 - 5.80 Million/uL   Hemoglobin 10.8 (L) 13.2 - 17.1 g/dL   HCT 31.3 (L) 38.5 - 50.0 %   MCV 100.6 (H) 80.0 - 100.0 fL   MCH 34.7 (H) 27.0 - 33.0 pg   MCHC 34.5 32.0 - 36.0 g/dL   RDW 15.7 (H) 11.0 - 15.0 %   Platelets 169 140 - 400 Thousand/uL   MPV 10.7 7.5 - 12.5 fL   Neutro Abs 4,520 1,500 - 7,800 cells/uL   Lymphs Abs 701 (L) 850 - 3,900 cells/uL   Absolute Monocytes 868 200 - 950 cells/uL   Eosinophils Absolute 93 15 - 500 cells/uL   Basophils Absolute 19 0 - 200 cells/uL   Neutrophils Relative % 72.9 %   Total Lymphocyte 11.3 %   Monocytes Relative 14.0 %   Eosinophils Relative 1.5 %   Basophils Relative 0.3 %     PHQ2/9: Depression screen [facility] 2/9 05/02/2018 05/01/2018 01/04/2018 10/05/2017 03/29/2016  Decreased  Interest 0 0 0 0 0  Down, Depressed, Hopeless 0 0 0 0 0  PHQ - 2 Score 0 0 0 0 0  Altered sleeping 0 0 1 0 -  Tired, decreased energy 0 0 2 0 -  Change in appetite 0 0 0 0 -  Feeling bad or failure about yourself  0 0 0 0 -  Trouble concentrating 0 0 0 0 -  Moving slowly or fidgety/restless 0 0 0 0 -  Suicidal thoughts 0 0 0 0 -  PHQ-9 Score 0 0 3 0 -  Difficult doing work/chores - - Not difficult at all Not difficult at all -    Fall Risk: Fall Risk  05/02/2018 01/04/2018 10/05/2017 03/29/2016 03/16/2016  Falls in the past year? 0 0 No No Yes  Number falls in past yr: 0 - - - 1  Injury with Fall? 0 - - - Yes  Comment - - - - winded  Follow up - - - - Falls evaluation completed    Assessment & Plan  1. Multifocal pneumonia  - cefdinir (OMNICEF) 300 MG capsule; Take 1 capsule (300 mg total) by mouth every other day. Repeat dose after HD if not on the day of medication  Dispense: 7 capsule; Refill: 0 - azithromycin (ZITHROMAX) 250 MG tablet; Two today and one daily afterwards  Dispense: 6 tablet; Refill: 0  2. Human immunodeficiency virus (HIV) disease ([place])  Taking medications  3. ESRD on dialysis ([place])  Adjusting dose base on GFR below 5

## 2018-05-03 ENCOUNTER — Telehealth: Payer: Self-pay | Admitting: Family Medicine

## 2018-05-03 NOTE — Telephone Encounter (Signed)
Copied from [place] [id number]. Topic: Quick Communication - Lab Results (Clinic Use ONLY) >> May 03, 2018  1:55 PM Chilton Greathouse, CMA wrote: Patient called.  Unable to reach patient. If patient calls back please inform him of his most recent lab results.

## 2018-05-03 NOTE — Telephone Encounter (Signed)
Pt given results per Dr Ancil Boozer; see result note dated 05/03/2018.

## 2018-05-09 ENCOUNTER — Ambulatory Visit: Payer: PRIVATE HEALTH INSURANCE | Admitting: Family Medicine

## 2018-05-10 ENCOUNTER — Ambulatory Visit: Payer: PRIVATE HEALTH INSURANCE | Admitting: Family Medicine

## 2018-05-10 ENCOUNTER — Encounter: Payer: Self-pay | Admitting: Family Medicine

## 2018-05-10 ENCOUNTER — Other Ambulatory Visit: Payer: Self-pay

## 2018-05-10 VITALS — BP 170/110 | HR 63 | Temp 98.0°F | Resp 16 | Ht 74.0 in | Wt 191.1 lb

## 2018-05-10 DIAGNOSIS — N186 End stage renal disease: Secondary | ICD-10-CM

## 2018-05-10 DIAGNOSIS — Z23 Encounter for immunization: Secondary | ICD-10-CM | POA: Diagnosis not present

## 2018-05-10 DIAGNOSIS — Z91199 Patient's noncompliance with other medical treatment and regimen due to unspecified reason: Secondary | ICD-10-CM

## 2018-05-10 DIAGNOSIS — J189 Pneumonia, unspecified organism: Secondary | ICD-10-CM

## 2018-05-10 DIAGNOSIS — I10 Essential (primary) hypertension: Secondary | ICD-10-CM

## 2018-05-10 DIAGNOSIS — Z9119 Patient's noncompliance with other medical treatment and regimen: Secondary | ICD-10-CM

## 2018-05-10 DIAGNOSIS — Z992 Dependence on renal dialysis: Secondary | ICD-10-CM

## 2018-05-10 NOTE — Progress Notes (Signed)
Name: Louis [name]   MRN: [id number]    DOB: 04-11-1960   Date:05/10/2018       Progress Note  Subjective  Chief Complaint  Chief Complaint  Patient presents with  . Pneumonia    follow up    HPI  Multifocal pneumonia: patient is here today for follow up, he has 3 doses left of Cefdinir . He is feeling better, no SOB or wheezing. He states he is feeling much better, appetite is getting back to normal, no nausea or vomiting.   HTN: he did not bring any of his medications with him and asked me to send refills. Explained to him that I have not been prescribing his bp mediation and that we will contact his pharmacy. After her left we found out that his prescriptions are getting filled by Dr. Holley Raring and Dr. Mendel Ryder Appelton . He does not need refills at this time based on pharmacist. During his visit I explained importance of bringing in all his medications to office visits and regular follow ups  ESRD: he states left arm fistula is mature now and is having HD two or three times weekly   Rosacea: he asked for refill of doxy but advised to follow up with dermatologist since he stated today medication does not work   Patient Active Problem List   Diagnosis Date Noted  . Mild protein-calorie malnutrition ([place]) 05/01/2018  . Eczema 01/04/2018  . History of anemia due to CKD 05/24/2017  . ESRD on dialysis ([place]) 12/19/2016  . 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 Baker's cyst of knee, right 05/17/2016  . Neck muscle spasm 03/16/2016  . Hypertension 06/23/2010  . Rosacea 06/23/2010  . Human immunodeficiency virus (HIV) disease ([place]) 01/02/1996    Past Surgical History:  Procedure Laterality Date  . CAPD INSERTION N/A 12/28/2016   Procedure: LAPAROSCOPIC INSERTION CONTINUOUS AMBULATORY PERITONEAL DIALYSIS  (CAPD) CATHETER;  Surgeon: Algernon Huxley, MD;  Location: [facility] OR[facility];  Service: Vascular;  Laterality: N/A;  . DIALYSIS/PERMA CATHETER INSERTION N/A 10/09/2016    Procedure: DIALYSIS/PERMA CATHETER INSERTION;  Surgeon: Algernon Huxley, MD;  Location: [place] [facility];  Service: Cardiovascular;  Laterality: N/A;  . DIALYSIS/PERMA CATHETER REMOVAL N/A 02/19/2017   Procedure: DIALYSIS/PERMA CATHETER REMOVAL;  Surgeon: Algernon Huxley, MD;  Location: [place] [facility];  Service: Cardiovascular;  Laterality: N/A;  . HERNIA REPAIR Bilateral    inguinal    Family History  Problem Relation Age of Onset  . Bradycardia Maternal Grandmother        with pacemaker    Social History   Socioeconomic History  . Marital status: Single    Spouse name: Not on file  . Number of children: 0  . Years of education: Not on file  . Highest education level: Associate degree: occupational, Hotel manager, or vocational program  Occupational History  . Not on file  Social Needs  . Financial resource strain: Not very hard  . Food insecurity:    Worry: Never true    Inability: Never true  . Transportation needs:    Medical: No    Non-medical: No  Tobacco Use  . Smoking status: Never Smoker  . Smokeless tobacco: Never Used  Substance and Sexual Activity  . Alcohol use: Not Currently    Alcohol/week: 2.0 standard drinks    Types: 2 Standard drinks or equivalent per week    Comment: has not had any alcohol in a while  . Drug use: No  .  Sexual activity: Yes    Partners: Female    Birth control/protection: Condom  Lifestyle  . Physical activity:    Days per week: 0 days    Minutes per session: 0 min  . Stress: Only a little  Relationships  . Social connections:    Talks on phone: More than three times a week    Gets together: More than three times a week    Attends religious service: More than 4 times per year    Active member of club or organization: No    Attends meetings of clubs or organizations: Never    Relationship status: Never married  . Intimate partner violence:    Fear of current or ex partner: No    Emotionally abused: No    Physically abused:  No    Forced sexual activity: No  Other Topics Concern  . Not on file  Social History Narrative  . Not on file     Current Outpatient Medications:  .  amLODipine (NORVASC) 10 MG tablet, Take 1 tablet (10 mg total) by mouth daily., Disp: 30 tablet, Rfl: 0 .  atenolol (TENORMIN) 100 MG tablet, Take 1 tablet by mouth daily., Disp: , Rfl:  .  AURYXIA 1 GM 210 MG(Fe) tablet, Take 1 tablet by mouth 4 (four) times daily -  before meals and at bedtime., Disp: , Rfl:  .  azithromycin (ZITHROMAX) 250 MG tablet, Two today and one daily afterwards, Disp: 6 tablet, Rfl: 0 .  B Complex-C-Folic Acid (RENA-VITE RX) 1 MG TABS, Take 1 tablet by mouth daily., Disp: , Rfl:  .  BRYHALI 0.01 % LOTN, Apply 1 each topically daily., Disp: , Rfl: 0 .  cefdinir (OMNICEF) 300 MG capsule, Take 1 capsule (300 mg total) by mouth every other day. Repeat dose after HD if not on the day of medication, Disp: 7 capsule, Rfl: 0 .  cetirizine (ZYRTEC) 10 MG tablet, Take 1 tablet (10 mg total) by mouth daily., Disp: 30 tablet, Rfl: 11 .  DESCOVY 200-25 MG tablet, Take 1 tablet by mouth daily., Disp: , Rfl:  .  doxycycline (VIBRAMYCIN) 100 MG capsule, Take 1 capsule by mouth 2 (two) times daily., Disp: , Rfl: 5 .  febuxostat (ULORIC) 40 MG tablet, Take 1 tablet by mouth daily., Disp: , Rfl:  .  ferrous sulfate 325 (65 FE) MG tablet, Take 1 tablet by mouth daily., Disp: , Rfl:  .  furosemide (LASIX) 40 MG tablet, Take 40 mg by mouth daily. , Disp: , Rfl:  .  hydrOXYzine (ATARAX/VISTARIL) 25 MG tablet, Take 25 mg by mouth 2 (two) times daily as needed for itching. , Disp: , Rfl:  .  methocarbamol (ROBAXIN) 500 MG tablet, Take 500 mg by mouth 2 (two) times daily as needed. for pain, Disp: , Rfl:  .  montelukast (SINGULAIR) 10 MG tablet, Take 10 mg by mouth daily., Disp: , Rfl: 3 .  predniSONE (DELTASONE) 10 MG tablet, Take 1 tablet (10 mg total) by mouth 2 (two) times daily as needed. Prn gout attack for 2 days, Disp: 10 tablet,  Rfl: 0 .  TIVICAY 50 MG tablet, Take 1 tablet by mouth daily., Disp: , Rfl:  .  valACYclovir (VALTREX) 500 MG tablet, Take 500 mg by mouth daily as needed (for flare ups). , Disp: , Rfl:   Allergies  Allergen Reactions  . Quinapril Hcl Swelling    Face swells    I personally reviewed active problem list, medication list, allergies,  family history, social history with the patient/caregiver today.   ROS  Constitutional: Negative for fever or weight change.  Respiratory: positive  for mid cough but no shortness of breath.   Cardiovascular: Negative for chest pain or palpitations.  Gastrointestinal: Negative for abdominal pain, no bowel changes.  Musculoskeletal: Negative for gait problem or joint swelling.  Skin: rosacea face  Neurological: Negative for dizziness or headache.  No other specific complaints in a complete review of systems (except as listed in HPI above).  Objective  Vitals:   05/10/18 0817  BP: (!) 170/110  Pulse: 63  Resp: 16  Temp: 98 F (36.7 C)  TempSrc: Oral  SpO2: 95%  Weight: 191 lb 1.6 oz (86.7 kg)  Height: 6\' 2"  (1.88 m)    Body mass index is 24.54 kg/m.  Physical Exam  Constitutional: Patient appears well-developed and well-nourished. No distress.  HEENT: head atraumatic, normocephalic, pupils equal and reactive to light, neck supple, throat within normal limits Skin: nodules, black heads, no erythema on face,  Cardiovascular: Normal rate, regular rhythm and normal heart sounds.  No murmur heard. No BLE edema. Pulmonary/Chest: Effort normal and breath sounds normal. No respiratory distress. Abdominal: Soft.  There is no tenderness. Psychiatric: Patient has a normal mood and affect. behavior is normal. Judgment and thought content normal.  Recent Results (from the past 2160 hour(s))  B Nat Peptide     Status: Abnormal   Collection Time: 05/01/18 11:20 AM  Result Value Ref Range   Brain Natriuretic Peptide 2,358 (H) <100 pg/mL    Comment:  . BNP levels increase with age in the general population with the highest values seen in individuals greater than 7 years of age. Reference: J. Am. [place] Ar. Cardiol. 2002; 93:734-287. Marland Kitchen   CBC with Differential/Platelet     Status: Abnormal   Collection Time: 05/01/18 11:20 AM  Result Value Ref Range   WBC 6.2 3.8 - 10.8 Thousand/uL   RBC 3.11 (L) 4.20 - 5.80 Million/uL   Hemoglobin 10.8 (L) 13.2 - 17.1 g/dL   HCT 31.3 (L) 38.5 - 50.0 %   MCV 100.6 (H) 80.0 - 100.0 fL   MCH 34.7 (H) 27.0 - 33.0 pg   MCHC 34.5 32.0 - 36.0 g/dL   RDW 15.7 (H) 11.0 - 15.0 %   Platelets 169 140 - 400 Thousand/uL   MPV 10.7 7.5 - 12.5 fL   Neutro Abs 4,520 1,500 - 7,800 cells/uL   Lymphs Abs 701 (L) 850 - 3,900 cells/uL   Absolute Monocytes 868 200 - 950 cells/uL   Eosinophils Absolute 93 15 - 500 cells/uL   Basophils Absolute 19 0 - 200 cells/uL   Neutrophils Relative % 72.9 %   Total Lymphocyte 11.3 %   Monocytes Relative 14.0 %   Eosinophils Relative 1.5 %   Basophils Relative 0.3 %      PHQ2/9: Depression screen [facility] 2/9 05/10/2018 05/02/2018 05/01/2018 01/04/2018 10/05/2017  Decreased Interest 0 0 0 0 0  Down, Depressed, Hopeless 0 0 0 0 0  PHQ - 2 Score 0 0 0 0 0  Altered sleeping 0 0 0 1 0  Tired, decreased energy 0 0 0 2 0  Change in appetite 0 0 0 0 0  Feeling bad or failure about yourself  0 0 0 0 0  Trouble concentrating 0 0 0 0 0  Moving slowly or fidgety/restless 0 0 0 0 0  Suicidal thoughts 0 0 0 0 0  PHQ-9 Score 0 0 0  3 0  Difficult doing work/chores - - - Not difficult at all Not difficult at all    phq 9 is negative   Fall Risk: Fall Risk  05/02/2018 01/04/2018 10/05/2017 03/29/2016 03/16/2016  Falls in the past year? 0 0 No No Yes  Number falls in past yr: 0 - - - 1  Injury with Fall? 0 - - - Yes  Comment - - - - winded  Follow up - - - - Falls evaluation completed      Assessment & Plan   1. Multifocal pneumonia  Finishing medication and doing better   2. ESRD on  dialysis [facility])  On HD past 2 years   3. Uncontrolled hypertension  He is asking for his bp medication, he has not been getting it from [place], see HPI  4. Need for vaccination for Strep pneumoniae  - Pneumococcal conjugate vaccine 13-valent  5. Non-compliance

## 2018-05-14 ENCOUNTER — Other Ambulatory Visit: Payer: Self-pay | Admitting: Family Medicine

## 2018-05-14 ENCOUNTER — Ambulatory Visit: Payer: PRIVATE HEALTH INSURANCE | Admitting: Family Medicine

## 2018-05-15 ENCOUNTER — Telehealth: Payer: Self-pay | Admitting: Family Medicine

## 2018-05-15 NOTE — Telephone Encounter (Signed)
Patient was notified prescription was called in yesterday and ready for him to pick up.

## 2018-05-15 NOTE — Telephone Encounter (Signed)
Pt left message on general voicemail wanting to know the status of his clonidine; he states that it was never called in.

## 2018-05-17 ENCOUNTER — Ambulatory Visit: Payer: PRIVATE HEALTH INSURANCE | Admitting: Family Medicine

## 2018-05-24 ENCOUNTER — Emergency Department: Payer: PRIVATE HEALTH INSURANCE

## 2018-05-24 ENCOUNTER — Emergency Department
Admission: EM | Admit: 2018-05-24 | Discharge: 2018-05-24 | Disposition: A | Discharge status: Home / Self Care | Payer: PRIVATE HEALTH INSURANCE | Attending: Emergency Medicine | Admitting: Emergency Medicine

## 2018-05-24 ENCOUNTER — Other Ambulatory Visit: Payer: Self-pay

## 2018-05-24 ENCOUNTER — Telehealth: Payer: Self-pay | Admitting: Family Medicine

## 2018-05-24 ENCOUNTER — Encounter: Payer: Self-pay | Admitting: Medical Oncology

## 2018-05-24 DIAGNOSIS — Z992 Dependence on renal dialysis: Secondary | ICD-10-CM | POA: Diagnosis not present

## 2018-05-24 DIAGNOSIS — I120 Hypertensive chronic kidney disease with stage 5 chronic kidney disease or end stage renal disease: Secondary | ICD-10-CM | POA: Insufficient documentation

## 2018-05-24 DIAGNOSIS — R05 Cough: Secondary | ICD-10-CM | POA: Insufficient documentation

## 2018-05-24 DIAGNOSIS — R059 Cough, unspecified: Secondary | ICD-10-CM

## 2018-05-24 DIAGNOSIS — Z21 Asymptomatic human immunodeficiency virus [HIV] infection status: Secondary | ICD-10-CM | POA: Diagnosis not present

## 2018-05-24 DIAGNOSIS — N186 End stage renal disease: Secondary | ICD-10-CM | POA: Diagnosis not present

## 2018-05-24 DIAGNOSIS — Z79899 Other long term (current) drug therapy: Secondary | ICD-10-CM | POA: Insufficient documentation

## 2018-05-24 DIAGNOSIS — Z9189 Other specified personal risk factors, not elsewhere classified: Secondary | ICD-10-CM

## 2018-05-24 LAB — CBC WITH DIFFERENTIAL/PLATELET
Abs Immature Granulocytes: 0.02 10*3/uL (ref 0.00–0.07)
Basophils Absolute: 0 10*3/uL (ref 0.0–0.1)
Basophils Relative: 1 %
Eosinophils Absolute: 0.1 10*3/uL (ref 0.0–0.5)
Eosinophils Relative: 2 %
HCT: 29 % — ABNORMAL LOW (ref 39.0–52.0)
Hemoglobin: 9.4 g/dL — ABNORMAL LOW (ref 13.0–17.0)
Immature Granulocytes: 0 %
Lymphocytes Relative: 22 %
Lymphs Abs: 1.1 10*3/uL (ref 0.7–4.0)
MCH: 33.6 pg (ref 26.0–34.0)
MCHC: 32.4 g/dL (ref 30.0–36.0)
MCV: 103.6 fL — ABNORMAL HIGH (ref 80.0–100.0)
Monocytes Absolute: 0.7 10*3/uL (ref 0.1–1.0)
Monocytes Relative: 15 %
Neutro Abs: 2.9 10*3/uL (ref 1.7–7.7)
Neutrophils Relative %: 60 %
Platelets: 226 10*3/uL (ref 150–400)
RBC: 2.8 MIL/uL — ABNORMAL LOW (ref 4.22–5.81)
RDW: 17.6 % — ABNORMAL HIGH (ref 11.5–15.5)
WBC: 4.8 10*3/uL (ref 4.0–10.5)
nRBC: 0 % (ref 0.0–0.2)

## 2018-05-24 LAB — COMPREHENSIVE METABOLIC PANEL
ALT: 15 U/L (ref 0–44)
AST: 21 U/L (ref 15–41)
Albumin: 3.7 g/dL (ref 3.5–5.0)
Alkaline Phosphatase: 60 U/L (ref 38–126)
Anion gap: 17 — ABNORMAL HIGH (ref 5–15)
BUN: 70 mg/dL — ABNORMAL HIGH (ref 6–20)
CO2: 22 mmol/L (ref 22–32)
Calcium: 8.3 mg/dL — ABNORMAL LOW (ref 8.9–10.3)
Chloride: 101 mmol/L (ref 98–111)
Creatinine, Ser: 18.08 mg/dL — ABNORMAL HIGH (ref 0.61–1.24)
GFR calc Af Amer: 3 mL/min — ABNORMAL LOW (ref >60)
GFR calc non Af Amer: 3 mL/min — ABNORMAL LOW (ref >60)
Glucose, Bld: 132 mg/dL — ABNORMAL HIGH (ref 70–99)
Potassium: 4.5 mmol/L (ref 3.5–5.1)
Sodium: 140 mmol/L (ref 135–145)
Total Bilirubin: 1.1 mg/dL (ref 0.3–1.2)
Total Protein: 7.6 g/dL (ref 6.5–8.1)

## 2018-05-24 LAB — BRAIN NATRIURETIC PEPTIDE: B Natriuretic Peptide: 1499 pg/mL — ABNORMAL HIGH (ref 0.0–100.0)

## 2018-05-24 NOTE — ED Provider Notes (Signed)
[facility] Emergency Department Provider Note   ____________________________________________   First MD Initiated Contact with Patient 05/24/18 1527     (approximate)  I have reviewed the triage vital signs and the nursing notes.   HISTORY  Chief Complaint Cough    HPI Louis [name] is a 58 y.o. male comes in complaining of cough.  He reports he was treated for pneumonia a few weeks ago and not sure it is all gone.  He is not having a fever anymore but the cough which is nonproductive seems to be getting slightly worse.  Review of his old records show he was initially seen on the 18th for a cough had a chest x-ray was diagnosed with multifocal pneumonia and treated with cefdinir and he says also Zithromax.  He seemed to be completely done but is now again having a cough that may be getting worse.  Past history also includes dialysis and HIV.  He reports he is taking his medicines faithfully and his viral load is been negative.        Past Medical History:  Diagnosis Date  . Chronic kidney disease    esrd  . Dermatophytosis of foot 06/23/2004  . Environmental allergies   . Gout   . HIV infection ([place]) 12/1995  . Hypertension   . Seborrhea 06/23/2004    Patient Active Problem List   Diagnosis Date Noted  . Mild protein-calorie malnutrition ([place]) 05/01/2018  . Eczema 01/04/2018  . History of anemia due to CKD 05/24/2017  . ESRD on dialysis ([place]) 12/19/2016  . 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 Baker's cyst of knee, right 05/17/2016  . Neck muscle spasm 03/16/2016  . Hypertension 06/23/2010  . Rosacea 06/23/2010  . Human immunodeficiency virus (HIV) disease ([place]) 01/02/1996    Past Surgical History:  Procedure Laterality Date  . CAPD INSERTION N/A 12/28/2016   Procedure: LAPAROSCOPIC INSERTION CONTINUOUS AMBULATORY PERITONEAL DIALYSIS  (CAPD) CATHETER;  Surgeon: Algernon Huxley, MD;  Location: [facility] ORS;  Service: Vascular;   Laterality: N/A;  . DIALYSIS/PERMA CATHETER INSERTION N/A 10/09/2016   Procedure: DIALYSIS/PERMA CATHETER INSERTION;  Surgeon: Algernon Huxley, MD;  Location: [place] CV LAB;  Service: Cardiovascular;  Laterality: N/A;  . DIALYSIS/PERMA CATHETER REMOVAL N/A 02/19/2017   Procedure: DIALYSIS/PERMA CATHETER REMOVAL;  Surgeon: Algernon Huxley, MD;  Location: [place] [facility];  Service: Cardiovascular;  Laterality: N/A;  . HERNIA REPAIR Bilateral    inguinal    Prior to Admission medications   Medication Sig Start Date End Date Taking? Authorizing Provider  amLODipine (NORVASC) 10 MG tablet Take 1 tablet (10 mg total) by mouth daily. 08/28/16  Yes Dustin Flock, MD  atenolol (TENORMIN) 100 MG tablet Take 1 tablet by mouth daily. 04/09/18  Yes [provider]  AURYXIA 1 GM 210 MG(Fe) tablet Take 1 tablet by mouth 4 (four) times daily -  before meals and at bedtime. 04/18/18  Yes [provider]  B Complex-C-Folic Acid (RENA-VITE RX) 1 MG TABS Take 1 tablet by mouth daily. 10/02/17  Yes [provider]  cetirizine (ZYRTEC) 10 MG tablet Take 1 tablet (10 mg total) by mouth daily. 01/04/18  Yes Sowles, Drue Stager, MD  cloNIDine (CATAPRES) 0.1 MG tablet 1 TABLET BY MOUTH TWICE A DAY 05/14/18  Yes Sowles, Drue Stager, MD  DESCOVY 200-25 MG tablet Take 1 tablet by mouth daily. 04/22/17  Yes [provider]  doxycycline (VIBRAMYCIN) 100 MG capsule Take 1 capsule by mouth  daily.  03/01/17  Yes Benitez-Graham, Cori Razor, MD  febuxostat (ULORIC) 40 MG tablet Take 1 tablet by mouth daily.   Yes [provider]  ferrous sulfate 325 (65 FE) MG tablet Take 1 tablet by mouth daily.   Yes [provider]  furosemide (LASIX) 40 MG tablet Take 40 mg by mouth daily.  12/06/16  Yes [provider]  lidocaine-prilocaine (EMLA) cream Apply 1 application topically 2 (two) times daily. 05/14/18  Yes [provider]  TIVICAY 50 MG tablet Take 1 tablet by mouth daily.  04/22/17  Yes [provider]  valACYclovir (VALTREX) 500 MG tablet Take 500 mg by mouth daily as needed (for flare ups).  12/06/16  Yes [provider]  cefdinir (OMNICEF) 300 MG capsule Take 1 capsule (300 mg total) by mouth every other day. Repeat dose after HD if not on the day of medication Patient not taking: Reported on 05/24/2018 05/02/18   Steele Sizer, MD  hydrOXYzine (ATARAX/VISTARIL) 25 MG tablet Take 25 mg by mouth 2 (two) times daily as needed for itching.  12/07/16   [provider]  methocarbamol (ROBAXIN) 500 MG tablet Take 500 mg by mouth 2 (two) times daily as needed. for pain 04/23/18   [provider]  predniSONE (DELTASONE) 10 MG tablet Take 1 tablet (10 mg total) by mouth 2 (two) times daily as needed. Prn gout attack for 2 days Patient not taking: Reported on 05/24/2018 03/29/18   Steele Sizer, MD    Allergies Quinapril hcl  Family History  Problem Relation Age of Onset  . Bradycardia Maternal Grandmother        with pacemaker    Social History Social History   Tobacco Use  . Smoking status: Never Smoker  . Smokeless tobacco: Never Used  Substance Use Topics  . Alcohol use: Not Currently    Alcohol/week: 2.0 standard drinks    Types: 2 Standard drinks or equivalent per week    Comment: has not had any alcohol in a while  . Drug use: No    Review of Systems  Constitutional: No fever/chills Eyes: No visual changes. ENT: No sore throat. Cardiovascular: Denies chest pain. Respiratory: Denies shortness of breath. Gastrointestinal: No abdominal pain.  No nausea, no vomiting.  No diarrhea.  No constipation. Genitourinary: Negative for dysuria. Musculoskeletal: Negative for back pain. Skin: Negative for rash. Neurological: Negative for headaches, focal weakness  ____________________________________________   PHYSICAL EXAM:  VITAL SIGNS: ED Triage Vitals  Enc Vitals Group     BP 05/24/18 1528 (!) 175/105      Pulse Rate 05/24/18 1528 77     Resp 05/24/18 1528 20     Temp 05/24/18 1528 98.5 F (36.9 C)     Temp Source 05/24/18 1528 Oral     SpO2 05/24/18 1528 97 %     Weight 05/24/18 1522 189 lb 9.5 oz (86 kg)     Height 05/24/18 1522 6\' 2"  (1.88 m)     Head Circumference --      Peak Flow --      Pain Score 05/24/18 1522 0     Pain Loc --      Pain Edu? --      Excl. in [place]? --     Constitutional: Alert and oriented. Well appearing and in no acute distress. Eyes: Conjunctivae are normal.  Head: Atraumatic. Nose: No congestion/rhinnorhea. Mouth/Throat: Mucous membranes are moist.  Oropharynx non-erythematous. Neck: No stridor.   Cardiovascular: Normal rate, regular  rhythm. Grossly normal heart sounds.  Good peripheral circulation. Respiratory: Normal respiratory effort.  No retractions. Lungs CTAB. Gastrointestinal: Soft and nontender. No distention. No abdominal bruits.  Musculoskeletal: No lower extremity tenderness nor edema Neurologic:  Normal speech and language. No gross focal neurologic deficits are appreciated. No gait instability. Skin:  Skin is warm, dry and intact. No rash noted.  ____________________________________________   LABS (all labs ordered are listed, but only abnormal results are displayed)  Labs Reviewed  COMPREHENSIVE METABOLIC PANEL - Abnormal; Notable for the following components:      Result Value   Glucose, Bld 132 (*)    BUN 70 (*)    Creatinine, Ser 18.08 (*)    Calcium 8.3 (*)    GFR calc non Af Amer 3 (*)    GFR calc Af Amer 3 (*)    Anion gap 17 (*)    All other components within normal limits  CBC WITH DIFFERENTIAL/PLATELET - Abnormal; Notable for the following components:   RBC 2.80 (*)    Hemoglobin 9.4 (*)    HCT 29.0 (*)    MCV 103.6 (*)    RDW 17.6 (*)    All other components within normal limits  BRAIN NATRIURETIC PEPTIDE - Abnormal; Notable for the following components:   B Natriuretic Peptide 1,499.0 (*)    All other components  within normal limits   ____________________________________________  EKG  EKG read interpreted by me shows normal sinus rhythm rate of 73 left axis no acute ST-T wave changes there are some nonspecific changes though. ____________________________________________  RADIOLOGY  ED MD interpretation:    Official radiology report(s): Dg Chest Portable 1 View  Result Date: 05/24/2018 CLINICAL DATA:  Cough. EXAM: PORTABLE CHEST 1 VIEW COMPARISON:  Radiographs of May 01, 2018. FINDINGS: Stable cardiomediastinal silhouette. Right internal jugular dialysis catheter is unchanged in position. No pneumothorax is noted. Stable bibasilar opacities are noted. No pneumothorax or pleural effusion is noted. Old left rib fractures again noted. IMPRESSION: Stable bibasilar opacities are noted concerning for pneumonia or atelectasis. Electronically Signed   By: Marijo Conception, M.D.   On: 05/24/2018 16:00    ____________________________________________   PROCEDURES  Procedure(s) performed (including Critical Care):  Procedures   ____________________________________________   INITIAL IMPRESSION / ASSESSMENT AND PLAN / ED COURSE Patient's lab work has improved from the time when he had his pneumonia last month.  His chest x-ray is unchanged.  He is afebrile.  The only positive test in fact is his BNP and even that is better than last month.  With no fever and no history of recent fever he does not meet our criteria for coronavirus testing.  Not believe he has it.  I believe he has some congestive failure and residual changes on his chest x-ray from pneumonia.  Has we know chest x-rays can take weeks and even months to clear after pneumonia.  I will have him get dialysis tomorrow as planned.  I discussed this with Dr. Juleen China who is on-call for renal here.  Patient will return here if he gets increasingly short of breath or develops a fever or has any other symptoms.          Clinical Course as of  May 23 1657  Fri May 24, 2018  1634 [facility]: 33.6 [PM]    Clinical Course User Index [PM] Nena Polio, MD     ____________________________________________   FINAL CLINICAL IMPRESSION(S) / ED DIAGNOSES  Final diagnoses:  Cough  At high  risk for fluid overload     ED Discharge Orders    None       Note:  This document was prepared using Dragon voice recognition software and may include unintentional dictation errors.    Nena Polio, MD 05/24/18 [phone]

## 2018-05-24 NOTE — Telephone Encounter (Signed)
Patient states he is feeling about the same and informed him of Dr. Ancil Boozer recommendations. He states he will go to the ER and wanted to know does Dr. Ancil Boozer need to call and let them know he is on his way?

## 2018-05-24 NOTE — ED Triage Notes (Signed)
Pt reports cough x 2 weeks. Denies sob. Denies fever currently states that he had a fever initially.

## 2018-05-24 NOTE — Telephone Encounter (Addendum)
Copied from [place] [id number]. Topic: General - Other >> May 24, 2018  7:55 AM Lennox Solders wrote: Reason for CRM: pt saw dr Ancil Boozer on 05-10-2018 and dx with PNA. Pt is calling the cough has returning sometimes the cough is wet cough and the back of his shoulder hurts this all started yesterday.  [facility]. Pt

## 2018-05-24 NOTE — Discharge Instructions (Addendum)
Chest x-ray looks unchanged from previously.  Your lab work all looks better than it did before.  The only thing that is abnormal actually is your BNP which is positive for fluid overload.  Since you are not running a fever you do not meet our criteria for testing for the coronavirus.  At this point you should be much worse if you had coronavirus I do not think you have it.  You do need dialysis.  Tomorrow should be good enough.  If you have any problems at all or if you develop a fever please return here.

## 2018-05-24 NOTE — ED Notes (Signed)
Pt placed in universal mask.

## 2018-05-30 ENCOUNTER — Telehealth: Payer: Self-pay | Admitting: Family Medicine

## 2018-05-31 ENCOUNTER — Encounter: Payer: Self-pay | Admitting: Family Medicine

## 2018-05-31 ENCOUNTER — Ambulatory Visit (INDEPENDENT_AMBULATORY_CARE_PROVIDER_SITE_OTHER): Payer: PRIVATE HEALTH INSURANCE | Admitting: Family Medicine

## 2018-05-31 ENCOUNTER — Other Ambulatory Visit: Payer: Self-pay

## 2018-05-31 DIAGNOSIS — I10 Essential (primary) hypertension: Secondary | ICD-10-CM

## 2018-05-31 DIAGNOSIS — Z992 Dependence on renal dialysis: Secondary | ICD-10-CM

## 2018-05-31 DIAGNOSIS — B20 Human immunodeficiency virus [HIV] disease: Secondary | ICD-10-CM

## 2018-05-31 DIAGNOSIS — I120 Hypertensive chronic kidney disease with stage 5 chronic kidney disease or end stage renal disease: Secondary | ICD-10-CM | POA: Diagnosis not present

## 2018-05-31 DIAGNOSIS — N186 End stage renal disease: Secondary | ICD-10-CM | POA: Diagnosis not present

## 2018-05-31 DIAGNOSIS — R05 Cough: Secondary | ICD-10-CM | POA: Diagnosis not present

## 2018-05-31 DIAGNOSIS — R059 Cough, unspecified: Secondary | ICD-10-CM

## 2018-05-31 MED ORDER — BENZONATATE 200 MG PO CAPS: 200.0000 mg | ORAL_CAPSULE | Freq: Three times a day (TID) | ORAL | 0 refills | Status: DC | PRN

## 2018-05-31 MED ORDER — UMECLIDINIUM-VILANTEROL 62.5-25 MCG/INH IN AEPB: 1.0000 | INHALATION_SPRAY | Freq: Every day | RESPIRATORY_TRACT | 0 refills | Status: DC

## 2018-05-31 NOTE — Progress Notes (Signed)
Name: Louis [name]   MRN: [id number]    DOB: 18-Jun-1960   Date:05/31/2018       Progress Note  Subjective  Chief Complaint  Chief Complaint  Patient presents with  . Cough  . Hypertension    I connected with  [name] on 05/31/18 at  8:00 AM EDT by telephone and verified that I am speaking with the correct person using two identifiers.  I discussed the limitations, risks, security and privacy concerns of performing an evaluation and management service by telephone and the availability of in person appointments. Staff also discussed with the patient that there may be a patient responsible charge related to this service. Patient Location: at home  Provider Location: [facility]   HPI  Cough: he had multifocal pneumonia back in March, treated with antibiotics and felt better, however continues to have a cough that is sometimes dry and sometimes wet, he is concerned about COVID-19. Reviewed CXR done at [facility] 05/24/2018, has some back pain from coughing so hard. He denies fever or chills. He continues to have a poor appetite. He states after therapy he felt better but is getting worse again. He is also feeling weak , states not sleeping at night because of the cough. He denies SOB   Uncontrolled HTN: he states at dialysis  center has improved since he resumed taking Clonidine BID . No palpitation  ESRD: [facility]. He states his cough is not worse when he is off HD.   HIV: his last visit was January 2020 and takes medication as prescribed  Patient Active Problem List   Diagnosis Date Noted  . Mild protein-calorie malnutrition ([place]) 05/01/2018  . Eczema 01/04/2018  . History of anemia due to CKD 05/24/2017  . ESRD on dialysis ([place]) 12/19/2016  . 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 Baker's cyst of knee, right 05/17/2016  . Neck muscle spasm 03/16/2016  . Hypertension 06/23/2010  . Rosacea 06/23/2010  . Human immunodeficiency  virus (HIV) disease ([place]) 01/02/1996    Past Surgical History:  Procedure Laterality Date  . CAPD INSERTION N/A 12/28/2016   Procedure: LAPAROSCOPIC INSERTION CONTINUOUS AMBULATORY PERITONEAL DIALYSIS  (CAPD) CATHETER;  Surgeon: Algernon Huxley, MD;  Location: [facility] ORS;  Service: Vascular;  Laterality: N/A;  . DIALYSIS/PERMA CATHETER INSERTION N/A 10/09/2016   Procedure: DIALYSIS/PERMA CATHETER INSERTION;  Surgeon: Algernon Huxley, MD;  Location: [place] [facility];  Service: Cardiovascular;  Laterality: N/A;  . DIALYSIS/PERMA CATHETER REMOVAL N/A 02/19/2017   Procedure: DIALYSIS/PERMA CATHETER REMOVAL;  Surgeon: Algernon Huxley, MD;  Location: [place] [facility];  Service: Cardiovascular;  Laterality: N/A;  . HERNIA REPAIR Bilateral    inguinal    Family History  Problem Relation Age of Onset  . Bradycardia Maternal Grandmother        with pacemaker    Social History   Socioeconomic History  . Marital status: Single    Spouse name: Not on file  . Number of children: 0  . Years of education: Not on file  . Highest education level: Associate degree: occupational, Hotel manager, or vocational program  Occupational History  . Not on file  Social Needs  . Financial resource strain: Not very hard  . Food insecurity:    Worry: Never true    Inability: Never true  . Transportation needs:    Medical: No    Non-medical: No  Tobacco Use  . Smoking status: Never Smoker  .  Smokeless tobacco: Never Used  Substance and Sexual Activity  . Alcohol use: Not Currently    Alcohol/week: 2.0 standard drinks    Types: 2 Standard drinks or equivalent per week    Comment: has not had any alcohol in a while  . Drug use: No  . Sexual activity: Yes    Partners: Female    Birth control/protection: Condom  Lifestyle  . Physical activity:    Days per week: 0 days    Minutes per session: 0 min  . Stress: Only a little  Relationships  . Social connections:    Talks on phone: More than three times a  week    Gets together: More than three times a week    Attends religious service: More than 4 times per year    Active member of club or organization: No    Attends meetings of clubs or organizations: Never    Relationship status: Never married  . Intimate partner violence:    Fear of current or ex partner: No    Emotionally abused: No    Physically abused: No    Forced sexual activity: No  Other Topics Concern  . Not on file  Social History Narrative  . Not on file     Current Outpatient Medications:  .  amLODipine (NORVASC) 10 MG tablet, Take 1 tablet (10 mg total) by mouth daily., Disp: 30 tablet, Rfl: 0 .  atenolol (TENORMIN) 100 MG tablet, Take 1 tablet by mouth daily., Disp: , Rfl:  .  AURYXIA 1 GM 210 MG(Fe) tablet, Take 1 tablet by mouth 4 (four) times daily -  before meals and at bedtime., Disp: , Rfl:  .  B Complex-C-Folic Acid (RENA-VITE RX) 1 MG TABS, Take 1 tablet by mouth daily., Disp: , Rfl:  .  cetirizine (ZYRTEC) 10 MG tablet, Take 1 tablet (10 mg total) by mouth daily., Disp: 30 tablet, Rfl: 11 .  cloNIDine (CATAPRES) 0.1 MG tablet, 1 TABLET BY MOUTH TWICE A DAY, Disp: 180 tablet, Rfl: 0 .  DESCOVY 200-25 MG tablet, Take 1 tablet by mouth daily., Disp: , Rfl:  .  doxycycline (VIBRAMYCIN) 100 MG capsule, Take 1 capsule by mouth daily. , Disp: , Rfl: 5 .  Febuxostat 80 MG TABS, Take 1 tablet by mouth daily., Disp: , Rfl:  .  ferrous sulfate 325 (65 FE) MG tablet, Take 1 tablet by mouth daily., Disp: , Rfl:  .  furosemide (LASIX) 40 MG tablet, Take 40 mg by mouth daily. , Disp: , Rfl:  .  methocarbamol (ROBAXIN) 500 MG tablet, Take 500 mg by mouth 2 (two) times daily as needed. for pain, Disp: , Rfl:  .  predniSONE (DELTASONE) 10 MG tablet, Take 1 tablet (10 mg total) by mouth 2 (two) times daily as needed. Prn gout attack for 2 days (Patient not taking: Reported on 05/24/2018), Disp: 10 tablet, Rfl: 0 .  TIVICAY 50 MG tablet, Take 1 tablet by mouth daily., Disp: , Rfl:   .  valACYclovir (VALTREX) 500 MG tablet, Take 500 mg by mouth daily as needed (for flare ups). , Disp: , Rfl:   Allergies  Allergen Reactions  . Quinapril Hcl Swelling    Face swells    I personally reviewed active problem list, medication list, allergies, family history, social history with the patient/caregiver today.   ROS  Ten systems reviewed and is negative except as mentioned in HPI   Objective  Virtual encounter, vitals not obtained.   Physical  Exam  Awake, alert and no SOB , speaking in full sentences  PHQ2/9: Depression screen [facility] 2/9 05/31/2018 05/10/2018 05/02/2018 05/01/2018 01/04/2018  Decreased Interest 0 0 0 0 0  Down, Depressed, Hopeless 0 0 0 0 0  PHQ - 2 Score 0 0 0 0 0  Altered sleeping 3 0 0 0 1  Tired, decreased energy 3 0 0 0 2  Change in appetite 3 0 0 0 0  Feeling bad or failure about yourself  0 0 0 0 0  Trouble concentrating 0 0 0 0 0  Moving slowly or fidgety/restless 0 0 0 0 0  Suicidal thoughts 0 0 0 0 0  PHQ-9 Score 9 0 0 0 3  Difficult doing work/chores - - - - Not difficult at all   PHQ-2/9 Result is negative. Symptoms secondary to cough    Fall Risk: Fall Risk  05/31/2018 05/02/2018 01/04/2018 10/05/2017 03/29/2016  Falls in the past year? 0 0 0 No No  Number falls in past yr: 0 0 - - -  Injury with Fall? 0 0 - - -  Comment - - - - -  Follow up - - - - -     Assessment & Plan  1. Uncontrolled hypertension  Taking medication, advised to buy a bp machine and keep a log   2. Human immunodeficiency virus (HIV) disease ([place])  Continue medicationss  3. Cough  Explained to him he likely has post-bronchial cough. He has been sick for weeks and better than before. We will try medications below, gave him reassurance that even if it was COVID-19 he is passed the worse phase  - benzonatate (TESSALON) 200 MG capsule; Take 1 capsule (200 mg total) by mouth 3 (three) times daily as needed.  Dispense: 40 capsule; Refill: 0 -  umeclidinium-vilanterol (ANORO ELLIPTA) 62.5-25 MCG/INH AEPB; Inhale 1 puff into the lungs daily.  Dispense: 60 each; Refill: 0  4. ESRD on dialysis [facility])  Continue HD, he states only going twice a week, explained that he may benefit on 3 times week since he may have some fluid build up that may aggravate his cough  I discussed the assessment and treatment plan with the patient. The patient was provided an opportunity to ask questions and all were answered. The patient agreed with the plan and demonstrated an understanding of the instructions.   The patient was advised to call back or seek an in-person evaluation if the symptoms worsen or if the condition fails to improve as anticipated.  I provided 25 minutes of non-face-to-face time during this encounter.  Loistine Chance, MD

## 2018-06-03 ENCOUNTER — Other Ambulatory Visit: Payer: Self-pay | Admitting: Family Medicine

## 2018-06-03 ENCOUNTER — Telehealth: Payer: Self-pay | Admitting: Family Medicine

## 2018-06-03 MED ORDER — PROMETHAZINE-CODEINE 6.25-10 MG/5ML PO SOLN: 5.0000 mL | Freq: Two times a day (BID) | ORAL | 0 refills | Status: DC | PRN

## 2018-06-03 NOTE — Telephone Encounter (Signed)
Patient notified about cough medication being sent into his pharmacy and if no improvement to go to urgent care. Patient verbalized understanding.

## 2018-06-03 NOTE — Telephone Encounter (Signed)
Copied from [place] [phone]. Topic: Quick Communication - See Telephone Encounter >> Jun 03, 2018  8:15 AM Robina Ade, Helene Kelp D wrote: CRM for notification. See Telephone encounter for: 06/03/18. Patient called and said that Dr. Ancil Boozer told him that she would send in cough syrup for him to his pharmacy. His cough is still bad. Please call patient back, thanks.

## 2018-06-06 ENCOUNTER — Other Ambulatory Visit: Payer: Self-pay | Admitting: Family Medicine

## 2018-06-06 ENCOUNTER — Telehealth: Payer: Self-pay | Admitting: Family Medicine

## 2018-06-06 DIAGNOSIS — M109 Gout, unspecified: Secondary | ICD-10-CM

## 2018-06-06 MED ORDER — PREDNISONE 10 MG PO TABS: 10.0000 mg | ORAL_TABLET | Freq: Two times a day (BID) | ORAL | 0 refills | Status: DC | PRN

## 2018-06-06 NOTE — Telephone Encounter (Signed)
Copied from [place] [phone]. Topic: General - Other >> Jun 06, 2018  8:08 AM Oneta Rack wrote: Relation to pt: self  Call back number: [phone] Pharm[id number] [place], [place] [address] [phone] (Phone) [phone] (Fax)  Reason for call:  Patient requesting predniSONE (DELTASONE) 10 MG tablet due to gout flare up last night, please advise

## 2018-06-06 NOTE — Telephone Encounter (Signed)
Did you want to refill this medication again or have him call in for visit

## 2018-06-07 ENCOUNTER — Telehealth: Payer: Self-pay | Admitting: Family Medicine

## 2018-06-07 NOTE — Telephone Encounter (Signed)
Patient was notified and states he verbalized to only take the Promethazine-Codeine at night. He is going to try to make it last, he was just worried since it was getting low. But if his cough continues he will call back for tessalon perles.

## 2018-06-07 NOTE — Telephone Encounter (Signed)
Copied from [place] [phone]. Topic: Quick Communication - Rx Refill/Question >> Jun 07, 2018  9:54 AM Virl Axe D wrote: Medication: Promethazine-Codeine 6.25-10 MG/5ML SOLN / Pt is requesting a refill of premethazine cough syrup in case he runs out over the weekend and needs more . Pt stated he has a half bottle left currently. Please advise.  Has the patient contacted their pharmacy? No. (Agent: If no, request that the patient contact the pharmacy for the refill.) (Agent: If yes, when and what did the pharmacy advise?)  Preferred Pharmacy (with phone number or street name): [id number] - [place], [place] [address] [phone] (Phone) [phone] (Fax)    Agent: Please be advised that RX refills may take up to 3 business days. We ask that you follow-up with your pharmacy.

## 2018-06-10 ENCOUNTER — Telehealth: Payer: Self-pay | Admitting: Family Medicine

## 2018-06-10 NOTE — Telephone Encounter (Signed)
Copied from [place] [id number]. Topic: Quick Communication - Rx Refill/Question >> Jun 10, 2018 12:24 PM Scherrie Gerlach wrote: Medication: febuxostat (ULORIC) 40 MG tablet  Prednisone 10 mg  Pt states he is having a gout outbreak in his knee, and wants to know if he can take these 2 meds together for gout. Pt still has a few prednisone left.  [id number] [place] [place] - [address] [phone] (Phone) [phone] (Fax)

## 2018-06-11 ENCOUNTER — Other Ambulatory Visit: Payer: Self-pay | Admitting: Family Medicine

## 2018-06-11 NOTE — Telephone Encounter (Signed)
Patient called. Patient aware. He may need a refill on his Prednisone.

## 2018-06-14 ENCOUNTER — Ambulatory Visit: Admit: 2018-06-14 | Discharge: 2018-06-15 | Payer: MEDICARE

## 2018-06-14 ENCOUNTER — Encounter: Admit: 2018-06-14 | Discharge: 2018-06-15 | Payer: MEDICARE

## 2018-06-14 ENCOUNTER — Non-Acute Institutional Stay: Admit: 2018-06-14 | Discharge: 2018-06-15 | Payer: MEDICARE

## 2018-06-14 ENCOUNTER — Ambulatory Visit: Payer: Self-pay | Admitting: *Deleted

## 2018-06-14 DIAGNOSIS — R079 Chest pain, unspecified: Principal | ICD-10-CM

## 2018-06-14 NOTE — Telephone Encounter (Signed)
Pt called with having some pain in his left chest and and left upper back when he moves his arm. The pain can get to a #7. And it is there until he puts his arm back down. It causes on some shortness of breath when he has the pain. No fever and no cough. He is requesting medication for the pain and asking for a refill on promethazine-codeine, which was prescribed for his cough before. He said that he just took some and is running low and wanted a refill. Advised patient that it is a controlled medication and that it maybe difficult to get, especially if he had refills before on it. He did want to schedule an appointment but wants his pcp to give him a call back. Advised that if he starts having difficulty breathing to go to the hospital. Pt voiced understanding. Routing to flow at [facility] for review. Reason for Disposition . [1] Chest pain lasting <= 5 minutes AND [2] NO chest pain or cardiac symptoms now(Exceptions: pains lasting a few seconds)  Answer Assessment - Initial Assessment Questions 1. LOCATION: "Where does it hurt?"       Left side 2. RADIATION: "Does the pain go anywhere else?" (e.g., into neck, jaw, arms, back)     Back left and front left 3. ONSET: "When did the chest pain begin?" (Minutes, hours or days)      Last night 4. PATTERN "Does the pain come and go, or has it been constant since it started?"  "Does it get worse with exertion?"      Comes and goes and worst with movement 5. DURATION: "How long does it last" (e.g., seconds, minutes, hours)     Until he puts his arm down 6. SEVERITY: "How bad is the pain?"  (e.g., Scale 1-10; mild, moderate, or severe)    - MILD (1-3): doesn't interfere with normal activities     - MODERATE (4-7): interferes with normal activities or awakens from sleep    - SEVERE (8-10): excruciating pain, unable to do any normal activities       With movement #7 7. CARDIAC RISK FACTORS: "Do you have any history of heart problems or risk  factors for heart disease?" (e.g., prior heart attack, angina; high blood pressure, diabetes, being overweight, high cholesterol, smoking, or strong family history of heart disease)     High blood pressure, kidney failure, hx of grandmother with heart disease 8. PULMONARY RISK FACTORS: "Do you have any history of lung disease?"  (e.g., blood clots in lung, asthma, emphysema, birth control pills)     no 9. CAUSE: "What do you think is causing the chest pain?"     coronavirus  10. OTHER SYMPTOMS: "Do you have any other symptoms?" (e.g., dizziness, nausea, vomiting, sweating, fever, difficulty breathing, cough)       Shortness of breath when pain comes 11. PREGNANCY: "Is there any chance you are pregnant?" "When was your last menstrual period?"       N/.a  Protocols used: CHEST PAIN-A-AH

## 2018-06-14 NOTE — Telephone Encounter (Signed)
Called patient to inform hit to see NP or go to Urgent Care. He stated that he would got to Urgent Care.

## 2018-06-15 DIAGNOSIS — R9431 Abnormal electrocardiogram [ECG] [EKG]: Secondary | ICD-10-CM | POA: Insufficient documentation

## 2018-06-15 DIAGNOSIS — R079 Chest pain, unspecified: Principal | ICD-10-CM

## 2018-06-15 MED ORDER — HEPARIN SODIUM (PORCINE) 5000 UNIT/ML IJ SOLN
5000.00 | INTRAMUSCULAR | Status: DC
Start: 2018-06-15 — End: 2018-06-15

## 2018-06-15 MED ORDER — MELATONIN 3 MG PO TABS
6.00 | ORAL_TABLET | ORAL | Status: DC
Start: ? — End: 2018-06-15

## 2018-06-15 MED ORDER — ATENOLOL 25 MG PO TABS
100.00 | ORAL_TABLET | ORAL | Status: DC
Start: 2018-06-16 — End: 2018-06-15

## 2018-06-15 MED ORDER — EMTRICITABINE-TENOFOVIR AF 200-25 MG PO TABS
1.00 | ORAL_TABLET | ORAL | Status: DC
Start: 2018-06-15 — End: 2018-06-15

## 2018-06-15 MED ORDER — DOLUTEGRAVIR SODIUM 50 MG PO TABS
50.00 | ORAL_TABLET | ORAL | Status: DC
Start: 2018-06-15 — End: 2018-06-15

## 2018-06-15 MED ORDER — UMECLIDINIUM-VILANTEROL 62.5-25 MCG/INH IN AEPB
1.00 | INHALATION_SPRAY | RESPIRATORY_TRACT | Status: DC
Start: 2018-06-16 — End: 2018-06-15

## 2018-06-15 MED ORDER — ACETAMINOPHEN 325 MG PO TABS
650.00 | ORAL_TABLET | ORAL | Status: DC
Start: ? — End: 2018-06-15

## 2018-06-15 MED ORDER — GENERIC EXTERNAL MEDICATION
2.00 | Status: DC
Start: ? — End: 2018-06-15

## 2018-06-15 MED ORDER — CALCIUM ACETATE (PHOS BINDER) 667 MG PO CAPS
667.00 | ORAL_CAPSULE | ORAL | Status: DC
Start: 2018-06-15 — End: 2018-06-15

## 2018-06-15 MED ORDER — ALUM & MAG HYDROXIDE-SIMETH 400-400-40 MG/5ML PO SUSP
30.00 | ORAL | Status: DC
Start: ? — End: 2018-06-15

## 2018-06-15 MED ORDER — VALACYCLOVIR HCL 500 MG PO TABS
500.00 | ORAL_TABLET | ORAL | Status: DC
Start: ? — End: 2018-06-15

## 2018-06-15 MED ORDER — METHOCARBAMOL 500 MG PO TABS
1000.00 | ORAL_TABLET | ORAL | Status: DC
Start: ? — End: 2018-06-15

## 2018-06-15 MED ORDER — GENERIC EXTERNAL MEDICATION
Status: DC
Start: ? — End: 2018-06-15

## 2018-06-15 MED ORDER — AMLODIPINE BESYLATE 10 MG PO TABS
10.00 | ORAL_TABLET | ORAL | Status: DC
Start: 2018-06-16 — End: 2018-06-15

## 2018-06-15 MED ORDER — VITAMIN B-COMPLEX PO TABS
1.00 | ORAL_TABLET | ORAL | Status: DC
Start: 2018-06-16 — End: 2018-06-15

## 2018-06-15 MED ORDER — ASPIRIN 81 MG PO CHEW
81.00 | CHEWABLE_TABLET | ORAL | Status: DC
Start: 2018-06-16 — End: 2018-06-15

## 2018-06-15 MED ORDER — GUAIFENESIN 100 MG/5ML PO SYRP
200.00 | ORAL_SOLUTION | ORAL | Status: DC
Start: ? — End: 2018-06-15

## 2018-06-15 MED ORDER — FERROUS SULFATE 325 (65 FE) MG PO TABS
325.00 | ORAL_TABLET | ORAL | Status: DC
Start: 2018-06-16 — End: 2018-06-15

## 2018-06-15 MED ORDER — CLONIDINE HCL 0.1 MG PO TABS
0.10 | ORAL_TABLET | ORAL | Status: DC
Start: 2018-06-15 — End: 2018-06-15

## 2018-06-15 MED ORDER — POLYETHYLENE GLYCOL 3350 17 G PO PACK
17.00 | PACK | ORAL | Status: DC
Start: ? — End: 2018-06-15

## 2018-06-15 MED ORDER — DOXYCYCLINE HYCLATE 100 MG PO TABS
100.00 | ORAL_TABLET | ORAL | Status: DC
Start: 2018-06-16 — End: 2018-06-15

## 2018-06-15 MED ORDER — FEBUXOSTAT 40 MG PO TABS
40.00 | ORAL_TABLET | ORAL | Status: DC
Start: 2018-06-16 — End: 2018-06-15

## 2018-06-15 MED ORDER — ASPIRIN 81 MG CHEWABLE TABLET
ORAL_TABLET | Freq: Every day | ORAL | 0 refills | 0.00000 days | Status: CP
Start: 2018-06-15 — End: 2018-08-04

## 2018-06-19 ENCOUNTER — Telehealth: Payer: Self-pay | Admitting: Family Medicine

## 2018-06-19 NOTE — Telephone Encounter (Signed)
Copied from [place] [id number]. Topic: Quick Communication - See Telephone Encounter >> Jun 19, 2018  1:54 PM Nils Flack wrote: CRM for notification. See Telephone encounter for: 06/19/18. Pt has stress test coming up, and was told he can not take atenolol.  He also needs refill on prednisone  [facility]  Cb is [phone]

## 2018-06-20 ENCOUNTER — Telehealth: Payer: Self-pay | Admitting: Family Medicine

## 2018-06-20 DIAGNOSIS — M109 Gout, unspecified: Secondary | ICD-10-CM

## 2018-06-20 NOTE — Telephone Encounter (Signed)
Left a message asking if he had ran out of prednisone and if he has does he want a referral to Rheumatology?

## 2018-06-20 NOTE — Telephone Encounter (Signed)
Copied from [place] [id number]. Topic: Quick Communication - Rx Refill/Question >> Jun 20, 2018  3:05 PM Blase Mess A wrote: Medication: predniSONE (DELTASONE) 10 MG tablet [[id number]]   Has the patient contacted their pharmacy? Yes  (Agent: If no, request that the patient contact the pharmacy for the refill.) (Agent: If yes, when and what did the pharmacy advise?)  Preferred Pharmacy (with phone number or street name):  [id number] - [place], [address] [phone] (Phone) [phone] (Fax)     Agent: Please be advised that RX refills may take up to 3 business days. We ask that you follow-up with your pharmacy.

## 2018-06-20 NOTE — Telephone Encounter (Signed)
Patient called and advised of the question by Tiffany, CMA. He says he already saw a rheumatologist and the rheumatologist said it was gout. He says he will need another refill on Prednisone and says he has 1 pill left to take tomorrow. He says he just spoke to someone who says they are sending in the refill.

## 2018-06-21 MED ORDER — PREDNISONE 10 MG PO TABS: 10.0000 mg | ORAL_TABLET | Freq: Two times a day (BID) | ORAL | 0 refills | Status: DC | PRN

## 2018-06-21 NOTE — Telephone Encounter (Signed)
Patient is taking Uloric every day it is just that he had such a bad gout flair up on his toe and foot. He has it getting under control better since taking the Prednisone but just wanted some incase it faired up again.

## 2018-06-23 ENCOUNTER — Other Ambulatory Visit: Payer: Self-pay | Admitting: Family Medicine

## 2018-06-23 DIAGNOSIS — R059 Cough, unspecified: Secondary | ICD-10-CM

## 2018-06-23 DIAGNOSIS — R05 Cough: Secondary | ICD-10-CM

## 2018-07-01 ENCOUNTER — Encounter: Admit: 2018-07-01 | Discharge: 2018-07-02 | Payer: MEDICARE

## 2018-07-01 ENCOUNTER — Non-Acute Institutional Stay: Admit: 2018-07-01 | Discharge: 2018-07-02 | Payer: MEDICARE

## 2018-07-01 DIAGNOSIS — R079 Chest pain, unspecified: Principal | ICD-10-CM

## 2018-07-07 ENCOUNTER — Encounter: Admit: 2018-07-07 | Discharge: 2018-07-07 | Disposition: A | Discharge status: Home / Self Care | Payer: MEDICARE

## 2018-07-07 DIAGNOSIS — R05 Cough: Principal | ICD-10-CM

## 2018-07-07 MED ORDER — AMOXICILLIN 875 MG-POTASSIUM CLAVULANATE 125 MG TABLET
ORAL_TABLET | Freq: Two times a day (BID) | ORAL | 0 refills | 0.00000 days | Status: CP
Start: 2018-07-07 — End: 2018-08-04

## 2018-07-07 MED ORDER — AZITHROMYCIN 250 MG TABLET
ORAL_TABLET | Freq: Every day | ORAL | 0 refills | 0.00000 days | Status: CP
Start: 2018-07-07 — End: 2018-08-04

## 2018-08-02 ENCOUNTER — Encounter: Admit: 2018-08-02 | Discharge: 2018-08-03 | Payer: MEDICARE

## 2018-08-02 DIAGNOSIS — L409 Psoriasis, unspecified: Secondary | ICD-10-CM

## 2018-08-02 DIAGNOSIS — R21 Rash and other nonspecific skin eruption: Principal | ICD-10-CM

## 2018-08-02 DIAGNOSIS — Z79899 Other long term (current) drug therapy: Secondary | ICD-10-CM

## 2018-08-15 ENCOUNTER — Other Ambulatory Visit: Payer: Self-pay | Admitting: Family Medicine

## 2018-08-15 DIAGNOSIS — M109 Gout, unspecified: Secondary | ICD-10-CM

## 2018-08-15 NOTE — Telephone Encounter (Signed)
Copied from [place] [id number]. Topic: Quick Communication - Rx Refill/Question >> Aug 15, 2018 12:35 PM Leward Quan A wrote: Medication: predniSONE (DELTASONE) 10 MG tablet  Has the patient contacted their pharmacy? Yes.   (Agent: If no, request that the patient contact the pharmacy for the refill.) (Agent: If yes, when and what did the pharmacy advise?)  Preferred Pharmacy (with phone number or street name): [id number] - [place], [address] [phone] (Phone) [phone] (Fax)    Agent: Please be advised that RX refills may take up to 3 business days. We ask that you follow-up with your pharmacy.

## 2018-08-19 ENCOUNTER — Encounter: Payer: Self-pay | Admitting: Family Medicine

## 2018-08-19 ENCOUNTER — Other Ambulatory Visit: Payer: Self-pay

## 2018-08-19 ENCOUNTER — Ambulatory Visit (INDEPENDENT_AMBULATORY_CARE_PROVIDER_SITE_OTHER): Payer: PRIVATE HEALTH INSURANCE | Admitting: Family Medicine

## 2018-08-19 VITALS — BP 158/86 | HR 67 | Temp 97.1°F | Resp 16 | Ht 74.0 in | Wt 191.1 lb

## 2018-08-19 DIAGNOSIS — Z992 Dependence on renal dialysis: Secondary | ICD-10-CM

## 2018-08-19 DIAGNOSIS — N186 End stage renal disease: Secondary | ICD-10-CM

## 2018-08-19 DIAGNOSIS — M109 Gout, unspecified: Secondary | ICD-10-CM

## 2018-08-19 DIAGNOSIS — M19041 Primary osteoarthritis, right hand: Secondary | ICD-10-CM

## 2018-08-19 DIAGNOSIS — E441 Mild protein-calorie malnutrition: Secondary | ICD-10-CM

## 2018-08-19 DIAGNOSIS — I10 Essential (primary) hypertension: Secondary | ICD-10-CM | POA: Diagnosis not present

## 2018-08-19 DIAGNOSIS — B20 Human immunodeficiency virus [HIV] disease: Secondary | ICD-10-CM

## 2018-08-19 MED ORDER — PREDNISONE 10 MG PO TABS: 10.0000 mg | ORAL_TABLET | Freq: Two times a day (BID) | ORAL | 0 refills | Status: DC | PRN

## 2018-08-19 NOTE — Progress Notes (Signed)
Name: Louis [name]   MRN: [id number]    DOB: 18-Sep-1960   Date:08/19/2018       Progress Note  Subjective  Chief Complaint  Chief Complaint  Patient presents with  . Medication Refill  . Hypertension    States it has been up and down-Has been as high as 200/100 and normaly 150/100.  Marland Kitchen HIV Positive/AIDS  . ESRD  . Hand Pain    Right Index finger and radiates to thumb-Feels like his gout is getting worst and is bothering him. Hard to crip or put pressures with this finger.    HPI  Right thumb pain: he states he uses his hands at work and has intermittent pain that is aching/ sometimes sharp on the MCP joint of right thumb, it does not get red or very swollen but aching to touch or movement, he thinks it is gout, but explained likely OA and he can try tylenol instead for that since he is unable to take nsaids  ESRD on HD: he goes to the HD center three times a week, he has severe pruritis , worse on arms and now also has a rash, he was referred to Dr. Will Bonnet at  [facility] dermatology and states waiting to find out what nephrologist will approve in terms of medication    HIV: under the care of ID and is on suppressive therapy  Gout: recurrent, usually on his foot, he needs refill of prednisone today  HTN: he is taking clonidine two in am and is feeling sleepy, explained that he needs to take it BID as prescribed, he is also on norvasc and Atenolol , advised to change to Atenolol half BID, he states his bp goes up and down but usually in the 150's range. He denies chest pain or palpitation   Malnutrition: based on previous albumin level, discussed dietary supplements but needs to be approved by nephrologist  Ear fullness: he thinks because he uses qtip, unfortunately I did not exam his ear before he left  Patient Active Problem List   Diagnosis Date Noted  . Mild protein-calorie malnutrition ([place]) 05/01/2018  . Eczema 01/04/2018  . History of anemia due to CKD 05/24/2017  . ESRD on  dialysis ([place]) 12/19/2016  . 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 Baker's cyst of knee, right 05/17/2016  . Neck muscle spasm 03/16/2016  . Hypertension 06/23/2010  . Rosacea 06/23/2010  . Human immunodeficiency virus (HIV) disease ([place]) 01/02/1996    Past Surgical History:  Procedure Laterality Date  . CAPD INSERTION N/A 12/28/2016   Procedure: LAPAROSCOPIC INSERTION CONTINUOUS AMBULATORY PERITONEAL DIALYSIS  (CAPD) CATHETER;  Surgeon: Algernon Huxley, MD;  Location: [facility] ORS;  Service: Vascular;  Laterality: N/A;  . DIALYSIS/PERMA CATHETER INSERTION N/A 10/09/2016   Procedure: DIALYSIS/PERMA CATHETER INSERTION;  Surgeon: Algernon Huxley, MD;  Location: [place] [facility];  Service: Cardiovascular;  Laterality: N/A;  . DIALYSIS/PERMA CATHETER REMOVAL N/A 02/19/2017   Procedure: DIALYSIS/PERMA CATHETER REMOVAL;  Surgeon: Algernon Huxley, MD;  Location: [place] [facility];  Service: Cardiovascular;  Laterality: N/A;  . HERNIA REPAIR Bilateral    inguinal    Family History  Problem Relation Age of Onset  . Bradycardia Maternal Grandmother        with pacemaker    Social History   Socioeconomic History  . Marital status: Single    Spouse name: Not on file  . Number of children: 0  . Years of education: Not on file  .  Highest education level: Associate degree: occupational, Hotel manager, or vocational program  Occupational History  . Not on file  Social Needs  . Financial resource strain: Not very hard  . Food insecurity    Worry: Never true    Inability: Never true  . Transportation needs    Medical: No    Non-medical: No  Tobacco Use  . Smoking status: Never Smoker  . Smokeless tobacco: Never Used  Substance and Sexual Activity  . Alcohol use: Not Currently    Alcohol/week: 2.0 standard drinks    Types: 2 Standard drinks or equivalent per week    Comment: has not had any alcohol in a while  . Drug use: No  . Sexual activity: Yes    Partners: Female    Birth  control/protection: Condom  Lifestyle  . Physical activity    Days per week: 0 days    Minutes per session: 0 min  . Stress: Only a little  Relationships  . Social connections    Talks on phone: More than three times a week    Gets together: More than three times a week    Attends religious service: More than 4 times per year    Active member of club or organization: No    Attends meetings of clubs or organizations: Never    Relationship status: Never married  . Intimate partner violence    Fear of current or ex partner: No    Emotionally abused: No    Physically abused: No    Forced sexual activity: No  Other Topics Concern  . Not on file  Social History Narrative  . Not on file     Current Outpatient Medications:  .  amLODipine (NORVASC) 10 MG tablet, Take 1 tablet (10 mg total) by mouth daily., Disp: 30 tablet, Rfl: 0 .  ANORO ELLIPTA 62.5-25 MCG/INH AEPB, TAKE 1 PUFF BY MOUTH EVERY DAY, Disp: 60 each, Rfl: 1 .  atenolol (TENORMIN) 100 MG tablet, Take 1 tablet by mouth daily., Disp: , Rfl:  .  AURYXIA 1 GM 210 MG(Fe) tablet, Take 1 tablet by mouth 4 (four) times daily -  before meals and at bedtime., Disp: , Rfl:  .  B Complex-C-Folic Acid (RENA-VITE RX) 1 MG TABS, Take 1 tablet by mouth daily., Disp: , Rfl:  .  benzonatate (TESSALON) 200 MG capsule, Take 1 capsule (200 mg total) by mouth 3 (three) times daily as needed., Disp: 40 capsule, Rfl: 0 .  cetirizine (ZYRTEC) 10 MG tablet, Take 1 tablet (10 mg total) by mouth daily., Disp: 30 tablet, Rfl: 11 .  cloNIDine (CATAPRES) 0.1 MG tablet, 1 TABLET BY MOUTH TWICE A DAY, Disp: 180 tablet, Rfl: 0 .  DESCOVY 200-25 MG tablet, Take 1 tablet by mouth daily., Disp: , Rfl:  .  doxycycline (VIBRAMYCIN) 100 MG capsule, Take 1 capsule by mouth daily. , Disp: , Rfl: 5 .  Febuxostat 80 MG TABS, Take 1 tablet by mouth daily., Disp: , Rfl:  .  ferrous sulfate 325 (65 FE) MG tablet, Take 1 tablet by mouth daily., Disp: , Rfl:  .   furosemide (LASIX) 40 MG tablet, Take 40 mg by mouth daily. , Disp: , Rfl:  .  methocarbamol (ROBAXIN) 500 MG tablet, Take 500 mg by mouth 2 (two) times daily as needed. for pain, Disp: , Rfl:  .  predniSONE (DELTASONE) 10 MG tablet, Take 1 tablet (10 mg total) by mouth 2 (two) times daily as needed. Prn gout attack  for 2 days, Disp: 3 tablet, Rfl: 0 .  Promethazine-Codeine 6.25-10 MG/5ML SOLN, Take 5 mLs by mouth 2 (two) times daily as needed., Disp: 180 mL, Rfl: 0 .  TIVICAY 50 MG tablet, Take 1 tablet by mouth daily., Disp: , Rfl:  .  valACYclovir (VALTREX) 500 MG tablet, Take 500 mg by mouth daily as needed (for flare ups). , Disp: , Rfl:   Allergies  Allergen Reactions  . Quinapril Hcl Swelling    Face swells    I personally reviewed active problem list, medication list, allergies, family history, social history with the patient/caregiver today.   ROS  Constitutional: Negative for fever or weight change.  Respiratory: Negative for cough and shortness of breath.   Cardiovascular: Negative for chest pain or palpitations.  Gastrointestinal: Negative for abdominal pain, no bowel changes.  Musculoskeletal: Negative for gait problem , positive for intermittent joint swelling.  Skin: Negative for rash.  Neurological: Negative for dizziness or headache.  No other specific complaints in a complete review of systems (except as listed in HPI above).  Objective  Vitals:   08/19/18 1423  BP: (!) 158/86  Pulse: 67  Resp: 16  Temp: (!) 97.1 F (36.2 C)  TempSrc: Temporal  SpO2: 99%  Weight: 191 lb 1.6 oz (86.7 kg)  Height: 6\' 2"  (1.88 m)    Body mass index is 24.54 kg/m.  Physical Exam  Constitutional: Patient appears well-developed and well-nourished.  No distress.  HEENT: head atraumatic, normocephalic, pupils equal and reactive to light, neck supple, Cardiovascular: Normal rate, regular rhythm and normal heart sounds.  No murmur heard. No BLE edema. Pulmonary/Chest: Effort  normal and breath sounds normal. No respiratory distress. Abdominal: Soft.  There is no tenderness. Muscular Skeletal:  Skin: violaceous circular lesions on both arms  Psychiatric: Patient has a normal mood and affect. behavior is normal. Judgment and thought content normal.  Recent Results (from the past 2160 hour(s))  Comprehensive metabolic panel     Status: Abnormal   Collection Time: 05/24/18  3:34 PM  Result Value Ref Range   Sodium 140 135 - 145 mmol/L   Potassium 4.5 3.5 - 5.1 mmol/L   Chloride 101 98 - 111 mmol/L   CO2 22 22 - 32 mmol/L   Glucose, Bld 132 (H) 70 - 99 mg/dL   BUN 70 (H) 6 - 20 mg/dL   Creatinine, Ser 18.08 (H) 0.61 - 1.24 mg/dL   Calcium 8.3 (L) 8.9 - 10.3 mg/dL   Total Protein 7.6 6.5 - 8.1 g/dL   Albumin 3.7 3.5 - 5.0 g/dL   AST 21 15 - 41 U/L   ALT 15 0 - 44 U/L   Alkaline Phosphatase 60 38 - 126 U/L   Total Bilirubin 1.1 0.3 - 1.2 mg/dL   GFR calc non Af Amer 3 (L) >60 mL/min   GFR calc Af Amer 3 (L) >60 mL/min   Anion gap 17 (H) 5 - 15    Comment: Performed at [facility], [address] [place], [place] [address]  CBC with Differential     Status: Abnormal   Collection Time: 05/24/18  3:34 PM  Result Value Ref Range   WBC 4.8 4.0 - 10.5 K/uL   RBC 2.80 (L) 4.22 - 5.81 MIL/uL   Hemoglobin 9.4 (L) 13.0 - 17.0 g/dL   HCT 29.0 (L) 39.0 - 52.0 %   MCV 103.6 (H) 80.0 - 100.0 fL   MCH 33.6 26.0 - 34.0 pg   MCHC  32.4 30.0 - 36.0 g/dL   RDW 17.6 (H) 11.5 - 15.5 %   Platelets 226 150 - 400 K/uL   nRBC 0.0 0.0 - 0.2 %   Neutrophils Relative % 60 %   Neutro Abs 2.9 1.7 - 7.7 K/uL   Lymphocytes Relative 22 %   Lymphs Abs 1.1 0.7 - 4.0 K/uL   Monocytes Relative 15 %   Monocytes Absolute 0.7 0.1 - 1.0 K/uL   Eosinophils Relative 2 %   Eosinophils Absolute 0.1 0.0 - 0.5 K/uL   Basophils Relative 1 %   Basophils Absolute 0.0 0.0 - 0.1 K/uL   Immature Granulocytes 0 %   Abs Immature Granulocytes 0.02 0.00 - 0.07 K/uL    Comment: Performed  at [facility], [address] [place], [place] [address]  Brain natriuretic peptide     Status: Abnormal   Collection Time: 05/24/18  3:34 PM  Result Value Ref Range   B Natriuretic Peptide 1,499.0 (H) 0.0 - 100.0 pg/mL    Comment: Performed at [facility], [address] [place], [place] [address]      PHQ2/9: Depression screen [facility] 2/9 08/19/2018 05/31/2018 05/10/2018 05/02/2018 05/01/2018  Decreased Interest 0 0 0 0 0  Down, Depressed, Hopeless 0 0 0 0 0  PHQ - 2 Score 0 0 0 0 0  Altered sleeping 3 3 0 0 0  Tired, decreased energy 1 3 0 0 0  Change in appetite 1 3 0 0 0  Feeling bad or failure about yourself  0 0 0 0 0  Trouble concentrating 0 0 0 0 0  Moving slowly or fidgety/restless 0 0 0 0 0  Suicidal thoughts 0 0 0 0 0  PHQ-9 Score 5 9 0 0 0  Difficult doing work/chores Not difficult at all - - - -  Some recent data might be hidden    phq 9 is negative   Fall Risk: Fall Risk  08/19/2018 05/31/2018 05/02/2018 01/04/2018 10/05/2017  Falls in the past year? 0 0 0 0 No  Number falls in past yr: 0 0 0 - -  Injury with Fall? 0 0 0 - -  Comment - - - - -  Follow up - - - - -     Functional Status Survey: Is the patient deaf or have difficulty hearing?: No Does the patient have difficulty seeing, even when wearing glasses/contacts?: Yes Does the patient have difficulty concentrating, remembering, or making decisions?: No Does the patient have difficulty walking or climbing stairs?: No Does the patient have difficulty dressing or bathing?: No Does the patient have difficulty doing errands alone such as visiting a doctor's office or shopping?: No    Assessment & Plan  1. Controlled gout  - predniSONE (DELTASONE) 10 MG tablet; Take 1 tablet (10 mg total) by mouth 2 (two) times daily as needed. Prn gout attack for 2 days  Dispense: 10 tablet; Refill: 0  2. Uncontrolled hypertension  Managed by nephrologist   3. ESRD on dialysis [facility])   HD three  times a week   4. Mild protein-calorie malnutrition ([place])   5. Human immunodeficiency virus (HIV) disease ([place])  Under the care of ID   6. Osteoarthritis of finger of right hand  Advised Tylenol prn for hand pain

## 2018-08-20 MED ORDER — OTEZLA STARTER 10 MG (4)-20 MG (4)-30 MG(47) TABLETS IN A DOSE PACK
0 refills | 0 days | Status: CP
Start: 2018-08-20 — End: ?
  Filled 2018-08-28: qty 55, 30d supply, fill #0

## 2018-08-20 MED ORDER — HALOBETASOL PROPIONATE 0.05 % TOPICAL CREAM
Freq: Two times a day (BID) | TOPICAL | 2 refills | 0.00000 days | Status: CP
Start: 2018-08-20 — End: 2019-08-20

## 2018-08-27 NOTE — Unmapped (Signed)
[facility]   Patient Onboarding/Medication Counseling    Mr.Duane [name] is a 58 y.o. male with psoriasis who I am counseling today on initiation of therapy.  I am speaking to the patient.    Verified patient's date of birth / HIPAA.    Specialty medication(s) to be sent: Inflammatory Disorders: Otezla      Non-specialty medications/supplies to be sent: na      Medications not needed at this time: na         Otezla (apremilast)    Medication & Administration     Dosage: Psoriasis (CrCl < 12ml/min): Take 10mg  by mouth once daily in the morning on days 1-3, 20mg  once daily on days 4 and 5, then 30mg  once daily starting on day 6    Administration: Take tablets by mouth.  Swallow whole ??? do not chew, break, or crush.    Adherence/Missed dose instructions:  Take a missed dose as soon as you think about it if it's within 6 hours of your normal dosing time.  Never take two doses at the same time to try and catch up after a missed dose.    Goals of Therapy     Plaque Psoriasis  ? Minimize areas of skin involvement (% BSA)  ? Avoidance of long term glucocorticoid use  ? Maintenance of effective psychosocial functioning      Side Effects & Monitoring Parameters     ? Upset stomach and/or diarrhea  ? Headache  ? Weight loss    The following side effects should be reported to the provider:  ? Signs of a hypersensitivity reaction ??? rash; hives; itching; red, swollen, blistered, or peeling skin; wheezing; tightness in the chest or throat; difficulty breathing, swallowing, or talking; swelling of the mouth, face, lips, tongue, or throat; etc.  ? Excessive weight loss  ? Changes in mood (depression) or behavior      Contraindications, Warnings, & Precautions     ? Have your bloodwork checked as you have been told by your prescriber  ? Talk with your doctor if you are pregnant, planning to become pregnant, or breastfeeding      Drug/Food Interactions     ? Medication list reviewed in Epic. The patient was instructed to inform the care team before taking any new medications or supplements. No drug interactions identified.   ? Apremilast is a major substrate for CYP3A4 ??? consult care team before starting any new medication (Rx or OTC) or supplements    Storage, Handling Precautions, & Disposal     ? Store at room temperature  ? Protect from moisture          Current Medications (including OTC/herbals), Comorbidities and Allergies     Current Outpatient Medications   Medication Sig Dispense Refill   ??? amLODIPine (NORVASC) 2.5 MG tablet Take 10 mg by mouth daily.     ??? apremilast (OTEZLA STARTER) 10 mg (4)-20 mg (4)-30 mg (47) DsPk Take 1 (10mg ) tablet by mouth in the morning for 3 days, then take 1 (20mg ) tablet in the morning for 2 days, then take 1 (30mg ) tablet in the morning therafter.  **DO NOT FOLLOW STARTER PACK INSTRUCTIONS - NO PM DOSES** 55 each 0   ??? atenoloL (TENORMIN) 100 MG tablet Take 100 mg by mouth daily.     ??? cloNIDine HCL (CATAPRES) 0.1 MG tablet Take 0.1 mg by mouth Two (2) times a day.     ??? dolutegravir (TIVICAY) 50 mg Tab  TABLET Take 50 mg by mouth daily.     ??? doxycycline (VIBRAMYCIN) 100 MG capsule Take 1 capsule (100 mg total) by mouth daily. 30 capsule 6   ??? emtricitabine-tenofovir alafen (DESCOVY) 200-25 mg tablet Take 1 tablet by mouth daily.     ??? febuxostat (ULORIC) 40 mg tablet Take 40 mg by mouth daily.     ??? ferric citrate (AURYXIA) 210 mg iron Tab tablet Take 420 mg by mouth Three (3) times a day with a meal.     ??? ferrous sulfate 325 (65 FE) MG tablet Take 325 mg by mouth daily.     ??? furosemide (LASIX) 80 MG tablet Take 80 mg by mouth daily.     ??? halobetasol (ULTRAVATE) 0.05 % cream Apply topically Two (2) times a day. 50 g 2   ??? methocarbamoL (ROBAXIN) 500 MG tablet Take 1,000 mg by mouth two (2) times a day as needed (muscle pain).     ??? umeclidinium-vilanteroL (ANORO ELLIPTA) 62.5-25 mcg/actuation inhaler Inhale 1 puff daily.     ??? valACYclovir (VALTREX) 500 MG tablet Take 500 mg by mouth daily.     ??? vit B cmplx-FA-vit C-biotin, renal, (RENA-VITE RX) 1-60-300 mg-mg-mcg Tab Take 1 tablet by mouth daily. 30 tablet 4     No current facility-administered medications for this visit.        Allergies   Allergen Reactions   ??? Quinapril Swelling     Face swells         Patient Active Problem List   Diagnosis   ??? ESRD (end stage renal disease) on dialysis (CMS-HCC)   ??? PD catheter dysfunction, initial encounter (CMS-HCC)   ??? HTN (hypertension)   ??? HIV (human immunodeficiency virus infection) (CMS-HCC)   ??? History of anemia due to CKD   ??? Dyspnea   ??? Prolonged Q-T interval on ECG       Reviewed and up to date in Epic.    Appropriateness of Therapy     Is medication and dose appropriate based on diagnosis? Yes    Baseline Quality of Life Assessment      How many days over the past month did your psoriasis keep you from your normal activities? 0    Financial Information     Medication Assistance provided: Prior Authorization    Anticipated copay of $3.90 reviewed with patient. Verified delivery address.    Delivery Information     Scheduled delivery date: Wed, July 15    Expected start date: Wed, July 15    Medication will be delivered via Same Day Courier to the home address in [place].  This shipment will not require a signature.      Explained the services we provide at [facility] and that each month we would call to set up refills.  Stressed importance of returning phone calls so that we could ensure they receive their medications in time each month.  Informed patient that we should be setting up refills 7-10 days prior to when they will run out of medication.  A pharmacist will reach out to perform a clinical assessment periodically.  Informed patient that a welcome packet and a drug information handout will be sent.      Patient verbalized understanding of the above information as well as how to contact the pharmacy at [phone] option 4 with any questions/concerns.  The pharmacy is open Monday through Friday 8:30am-4:30pm.  A pharmacist is available 24/7 via pager to  answer any clinical questions they may have.    Patient Specific Needs     ? Does the patient have any physical, cognitive, or cultural barriers? No    ? Patient prefers to have medications discussed with  Patient     ? Is the patient able to read and understand education materials at a high school level or above? No    ? Patient's primary language is  English     ? Is the patient high risk? No     ? Does the patient require a Care Management Plan? No     ? Does the patient require physician intervention or other additional services (i.e. nutrition, smoking cessation, social work)? No      Kahdijah Errickson A Desiree Lucy [facility] Specialty Pharmacist

## 2018-08-27 NOTE — Unmapped (Signed)
[facility] Specialty Medication Referral: PA Approved      Medication (Brand/Generic): OTEZLA    Final Test Claim completed with resulted information below:    Patient ABLE to fill at [facility]  Insurance Company:  [facility] MEDICARE PART D  Anticipated Copay: $3.90  Is anticipated copay with a copay card or grant? No, there is no need for grant or copay assistance.     Does this patient have to receive a partial fill of the medication due to insurance restrictions? NO  If so, please cofirm how many days supply is allowed per plan per fill and how long the patient will have to fill partial months supply for the medication: NOT APPLICABLE     If the copay is under the $25 defined limit, per policy there will be no further investigation of need for financial assistance at this time unless patient requests. This referral has been communicated to the provider and handed off to the [facility] team for further processing and filling of prescribed medication.   ______________________________________________________________________  Please utilize this referral for viewing purposes as it will serve as the central location for all relevant documentation and updates.

## 2018-08-28 MED FILL — OTEZLA STARTER 10 MG (4)-20 MG (4)-30 MG(47) TABLETS IN A DOSE PACK: 30 days supply | Qty: 55 | Fill #0 | Status: AC

## 2018-08-28 NOTE — Unmapped (Signed)
Call rec'd from [employer] case manager, Jaquita Rector, requesting an eval status update for member. TFC reviewd Epic notes / appts and patient has not been scheduled for evaluation at this time. Inbox message sent to [facility] and TPA requesting assistance with getting patient scheduled.     [employer] eval Berkley Harvey will term on 08/13/18 so TFC lvm for case manager requesting Berkley Harvey be extended so that patient may be scheduled

## 2018-08-29 NOTE — Unmapped (Signed)
I received a phone call 7/15 from Mr. Duane [name] to say he would like to hold off on starting [place] due to concerns of potential weight loss. He indicated he was having trouble maintaining weight on dialysis, and he felt like the halobetasol was helping to clear his skin. I advised we rarely see weight loss, though it is a potential side effect.    He received the starter pack of medication today. He asked that I check back with him in ~ 2 months to see how he's doing and to follow up on medication use. I will tag clinic so they are aware.    Mr. Duane [name] has our contact information in case he needs a refill sooner, or if he had additional questions.    Rashika Bettes A. Katrinka Blazing, PharmD, BCPS - Pharmacist   [facility]   [address] [place], [place] [address]   t [phone] - f [phone]

## 2018-08-29 NOTE — Unmapped (Signed)
Callback received from [employer] case manager, Jaquita Rector, extending evaluation auth thru 11/13/2018. Auth can only be extended 30 days at a time.

## 2018-09-02 NOTE — Unmapped (Signed)
-----   Message from Maggie Font, CNA sent at 08/19/2018  3:12 PM EDT -----  Regarding: RE: rx  Spoke with pt his dialysis dr are husband and wife Dr Thedore Mins and they are at [facility] in [place] and his HIV Dr Sampson Goon moved to [place] so he is seeing another Dr. Tiajuana Amass name Ephriam Knuckles at [facility] but he says Dr.Fitzgerald nurse Raynelle Fanning should be able to help you. I hope this is helpful.  ----- Message -----  From: Benjie Karvonen, MD  Sent: 08/19/2018   2:33 PM EDT  To: Maggie Font, CNA, #  Subject: RE: rx                                           I have not been able to get in touch with his dialysis doctor.  Can you ask him for the names of his dialysis doctor and his HIV doctor?  I need to check with both of them!  ----- Message -----  From: Maggie Font, CNA  Sent: 08/19/2018   1:23 PM EDT  To: Benjie Karvonen, MD  Subject: rx                                               Hey Dr.Cul ton Mr.Soderlund is calling asking for a Rx pill for his itching he said that you said you could prescribe to him and he is also asking if you have spooking to his Dialysis Doctor as well.

## 2018-09-02 NOTE — Unmapped (Signed)
Patient has decided to hold off on systemic medication for now.

## 2018-09-05 ENCOUNTER — Encounter: Payer: Self-pay | Admitting: Family Medicine

## 2018-09-05 ENCOUNTER — Ambulatory Visit (INDEPENDENT_AMBULATORY_CARE_PROVIDER_SITE_OTHER): Payer: PRIVATE HEALTH INSURANCE | Admitting: Family Medicine

## 2018-09-05 ENCOUNTER — Other Ambulatory Visit: Payer: Self-pay

## 2018-09-05 DIAGNOSIS — Z20828 Contact with and (suspected) exposure to other viral communicable diseases: Secondary | ICD-10-CM | POA: Diagnosis not present

## 2018-09-05 DIAGNOSIS — M79644 Pain in right finger(s): Secondary | ICD-10-CM

## 2018-09-05 DIAGNOSIS — H938X3 Other specified disorders of ear, bilateral: Secondary | ICD-10-CM | POA: Diagnosis not present

## 2018-09-05 DIAGNOSIS — Z20822 Contact with and (suspected) exposure to covid-19: Secondary | ICD-10-CM

## 2018-09-05 MED ORDER — DICLOFENAC SODIUM 1 % TD GEL: 2.0000 g | Freq: Four times a day (QID) | TRANSDERMAL | 0 refills | Status: DC

## 2018-09-05 NOTE — Progress Notes (Addendum)
Name: Louis [name]   MRN: [id number]    DOB: [birth year]   Date:09/05/2018       Progress Note  Subjective  Chief Complaint  Chief Complaint  Patient presents with  . Possible Exposure to [place]    Patient supervisor tested positive but that he was not around him and has not seen him since last tuesday. Denies any symptoms    I connected with  [name]  on 09/05/18 at  9:20 AM EDT by a video enabled telemedicine application and verified that I am speaking with the correct person using two identifiers.  I discussed the limitations of evaluation and management by telemedicine and the availability of in person appointments. The patient expressed understanding and agreed to proceed. Staff also discussed with the patient that there may be a patient responsible charge related to this service. Patient Location: he is at work  Provider Location: [facility]   HPI  Ear fullness: he came in on July 6th with ear fullness and I he left before I exam it, he has noticed difficulty hearing at times, sometimes pain, he has a history of cerumen impaction. He will return so I can take a look inside his ear next week, as a nurse visit only  Right thumb pain: he states he uses his hands at work and has intermittent pain that is aching/ sometimes sharp on the MCP joint of right thumb, it does not get red or very swollen but aching to touch or movement, he thinks it is gout, but explained likely OA and he can try tylenol instead for that since he is unable to take nsaids, discussed referral to Ortho and he wants to hold off, we will try topical medication and thumb spica  Possible COVID-19: his supervisor was diagnosed with COVID-19, his last contact with him was 9 days ago, he is asymptomatic, we will collect test today   Patient Active Problem List   Diagnosis Date Noted  . Prolonged Q-T interval on ECG 06/15/2018  . Mild protein-calorie malnutrition ([place]) 05/01/2018  .  Eczema 01/04/2018  . History of anemia due to CKD 05/24/2017  . ESRD on dialysis ([place]) 12/19/2016  . 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 Baker's cyst of knee, right 05/17/2016  . Neck muscle spasm 03/16/2016  . Hypertension 06/23/2010  . Rosacea 06/23/2010  . Human immunodeficiency virus (HIV) disease ([place]) 01/02/1996    Past Surgical History:  Procedure Laterality Date  . CAPD INSERTION N/A 12/28/2016   Procedure: LAPAROSCOPIC INSERTION CONTINUOUS AMBULATORY PERITONEAL DIALYSIS  (CAPD) CATHETER;  Surgeon: Algernon Huxley, MD;  Location: [facility] ORS;  Service: Vascular;  Laterality: N/A;  . DIALYSIS/PERMA CATHETER INSERTION N/A 10/09/2016   Procedure: DIALYSIS/PERMA CATHETER INSERTION;  Surgeon: Algernon Huxley, MD;  Location: [place] [facility];  Service: Cardiovascular;  Laterality: N/A;  . DIALYSIS/PERMA CATHETER REMOVAL N/A 02/19/2017   Procedure: DIALYSIS/PERMA CATHETER REMOVAL;  Surgeon: Algernon Huxley, MD;  Location: [place] [facility];  Service: Cardiovascular;  Laterality: N/A;  . HERNIA REPAIR Bilateral    inguinal    Family History  Problem Relation Age of Onset  . Bradycardia Maternal Grandmother        with pacemaker    Social History   Socioeconomic History  . Marital status: Single    Spouse name: Not on file  . Number of children: 0  . Years of education: Not on file  . Highest education level: Associate degree: occupational, technical,  or vocational program  Occupational History  . Not on file  Social Needs  . Financial resource strain: Not very hard  . Food insecurity    Worry: Never true    Inability: Never true  . Transportation needs    Medical: No    Non-medical: No  Tobacco Use  . Smoking status: Never Smoker  . Smokeless tobacco: Never Used  Substance and Sexual Activity  . Alcohol use: Not Currently    Alcohol/week: 2.0 standard drinks    Types: 2 Standard drinks or equivalent per week    Comment: has not had any alcohol in a  while  . Drug use: No  . Sexual activity: Yes    Partners: Female    Birth control/protection: Condom  Lifestyle  . Physical activity    Days per week: 0 days    Minutes per session: 0 min  . Stress: Only a little  Relationships  . Social connections    Talks on phone: More than three times a week    Gets together: More than three times a week    Attends religious service: More than 4 times per year    Active member of club or organization: No    Attends meetings of clubs or organizations: Never    Relationship status: Never married  . Intimate partner violence    Fear of current or ex partner: No    Emotionally abused: No    Physically abused: No    Forced sexual activity: No  Other Topics Concern  . Not on file  Social History Narrative  . Not on file     Current Outpatient Medications:  .  amLODipine (NORVASC) 10 MG tablet, Take 1 tablet (10 mg total) by mouth daily., Disp: 30 tablet, Rfl: 0 .  ANORO ELLIPTA 62.5-25 MCG/INH AEPB, TAKE 1 PUFF BY MOUTH EVERY DAY, Disp: 60 each, Rfl: 1 .  Apremilast (OTEZLA) 10 & 20 & 30 MG TBPK, Take 1 (10mg ) tablet by mouth in the morning for 3 days, then take 1 (20mg ) tablet in the morning for 2 days, then take 1 (30mg ) tablet in the morning therafter. **DO NOT FOLLOW STARTER PACK INSTRUCTIONS - NO PM DOSES**, Disp: , Rfl:  .  atenolol (TENORMIN) 100 MG tablet, Take 1 tablet by mouth daily., Disp: , Rfl:  .  AURYXIA 1 GM 210 MG(Fe) tablet, Take 1 tablet by mouth 4 (four) times daily -  before meals and at bedtime., Disp: , Rfl:  .  B Complex-C-Folic Acid (RENA-VITE RX) 1 MG TABS, Take 1 tablet by mouth daily., Disp: , Rfl:  .  cetirizine (ZYRTEC) 10 MG tablet, Take 1 tablet (10 mg total) by mouth daily., Disp: 30 tablet, Rfl: 11 .  cloNIDine (CATAPRES) 0.1 MG tablet, 1 TABLET BY MOUTH TWICE A DAY, Disp: 180 tablet, Rfl: 0 .  DESCOVY 200-25 MG tablet, Take 1 tablet by mouth daily., Disp: , Rfl:  .  doxycycline (VIBRAMYCIN) 100 MG capsule,  Take 1 capsule by mouth daily. , Disp: , Rfl: 5 .  Febuxostat 80 MG TABS, Take 1 tablet by mouth daily., Disp: , Rfl:  .  ferrous sulfate 325 (65 FE) MG tablet, Take 1 tablet by mouth daily., Disp: , Rfl:  .  furosemide (LASIX) 40 MG tablet, Take 40 mg by mouth daily. , Disp: , Rfl:  .  halobetasol (ULTRAVATE) 0.05 % cream, APPLY TO AFFECTED AREA TWICE A DAY, Disp: , Rfl:  .  methocarbamol (ROBAXIN) 500 MG  tablet, Take 500 mg by mouth 2 (two) times daily as needed. for pain, Disp: , Rfl:  .  predniSONE (DELTASONE) 10 MG tablet, Take 1 tablet (10 mg total) by mouth 2 (two) times daily as needed. Prn gout attack for 2 days, Disp: 10 tablet, Rfl: 0 .  TIVICAY 50 MG tablet, Take 1 tablet by mouth daily., Disp: , Rfl:  .  valACYclovir (VALTREX) 500 MG tablet, Take 500 mg by mouth daily as needed (for flare ups). , Disp: , Rfl:   Allergies  Allergen Reactions  . Quinapril Hcl Swelling    Face swells    I personally reviewed active problem list, medication list, allergies, family history, social history with the patient/caregiver today.   ROS  Ten systems reviewed and is negative except as mentioned in HPI   Objective  Virtual encounter, vitals not obtained.  There is no height or weight on file to calculate BMI.  Physical Exam  Awake, alert and oriented   PHQ2/9: Depression screen [facility] 2/9 09/05/2018 08/19/2018 05/31/2018 05/10/2018 05/02/2018  Decreased Interest 0 0 0 0 0  Down, Depressed, Hopeless 0 0 0 0 0  PHQ - 2 Score 0 0 0 0 0  Altered sleeping 0 3 3 0 0  Tired, decreased energy 0 1 3 0 0  Change in appetite 0 1 3 0 0  Feeling bad or failure about yourself  0 0 0 0 0  Trouble concentrating 0 0 0 0 0  Moving slowly or fidgety/restless 0 0 0 0 0  Suicidal thoughts 0 0 0 0 0  PHQ-9 Score 0 5 9 0 0  Difficult doing work/chores Not difficult at all Not difficult at all - - -  Some recent data might be hidden   PHQ-2/9 Result is negative.    Fall Risk: Fall Risk  09/05/2018  08/19/2018 05/31/2018 05/02/2018 01/04/2018  Falls in the past year? 0 0 0 0 0  Number falls in past yr: 0 0 0 0 -  Injury with Fall? 0 0 0 0 -  Comment - - - - -  Follow up - - - - -     Assessment & Plan  1. Exposure to Covid-19 Virus  - Novel Coronavirus, NAA ([facility])  2. Thumb pain, right  Also advised thumb spica - diclofenac sodium (VOLTAREN) 1 % GEL; Apply 2 g topically 4 (four) times daily.  Dispense: 100 g; Refill: 0  3. Ear fullness, bilateral  He will return for nurse visit once COVI D test negative for me to look at his ear and get ear lavage if needed  I discussed the assessment and treatment plan with the patient. The patient was provided an opportunity to ask questions and all were answered. The patient agreed with the plan and demonstrated an understanding of the instructions.  The patient was advised to call back or seek an in-person evaluation if the symptoms worsen or if the condition fails to improve as anticipated.  I provided 15 minutes of non-face-to-face time during this encounter.

## 2018-09-08 LAB — NOVEL CORONAVIRUS, NAA: SARS-CoV-2, NAA: NOT DETECTED

## 2018-09-09 ENCOUNTER — Telehealth: Payer: Self-pay | Admitting: Family Medicine

## 2018-09-09 NOTE — Telephone Encounter (Signed)
Pt aware covid lab test negative, not detected °

## 2018-09-11 ENCOUNTER — Ambulatory Visit (INDEPENDENT_AMBULATORY_CARE_PROVIDER_SITE_OTHER): Payer: PRIVATE HEALTH INSURANCE

## 2018-09-11 ENCOUNTER — Other Ambulatory Visit: Payer: Self-pay

## 2018-09-11 VITALS — BP 162/92

## 2018-09-11 DIAGNOSIS — H6123 Impacted cerumen, bilateral: Secondary | ICD-10-CM | POA: Diagnosis not present

## 2018-09-13 NOTE — Unmapped (Signed)
Ethelene Browns left a VM on the nurse line. He asked for a call from Dr. Orma Flaming as he had medication questions. He did not answer my phone call, and his VM is not secure so I left a generic message.

## 2018-09-13 NOTE — Unmapped (Signed)
I received a call from [place] at the pharmacy. He requested Bryhali (halobetasol) 0.01% lotion to be [facility] use on his [facility]. He has used this previously, and feels that this is most helpful. He also requested a [facility]xycycline [facility] (it's unclear who manages this, last written by nephrology).     He reports that the ketoconazole caused him to break out, and he has stopped using that.    He would still like to hold off on [place] treatment for now.    I will loop in dermatology clinic to see if they can help with the above requests.    Derrion Tritz A. Katrinka Blazing, PharmD, BCPS - Pharmacist   [facility]

## 2018-09-13 NOTE — Unmapped (Signed)
[facility] as [facility]lene [facility] wanted halobetasol refill. They said they had two refills left and would go ahead and refill it.

## 2018-09-16 MED ORDER — HALOBETASOL PROPIONATE 0.01 % LOTION
Freq: Every day | TOPICAL | 1 refills | 0.00000 days | Status: CP
Start: 2018-09-16 — End: ?

## 2018-09-16 NOTE — Unmapped (Signed)
Call from patient.  States he received the same cream he has at home-the 0.05%.  States he needs the 0.01% for his face.  Did not know the name of the medication for his face.

## 2018-10-03 ENCOUNTER — Telehealth: Payer: Self-pay | Admitting: Family Medicine

## 2018-10-03 NOTE — Telephone Encounter (Signed)
cloNIDine (CATAPRES) 0.1 MG tablet    Patient states that he feels as if this medication is not working as well as it should. Patient is requesting call back from CMA to discuss.

## 2018-10-07 NOTE — Telephone Encounter (Signed)
Patient states has not been taking clonodine because it makes him feel weird and when he does take his bp is still high.  Bp is high is there something else he can try?

## 2018-10-09 NOTE — Unmapped (Signed)
Casemanager phoned requesting a status update for this member. Theodoro Kos will term at end of September. TFC informed casemanager that patient has not been scheduled for evaluation at this time.

## 2018-10-09 NOTE — Telephone Encounter (Signed)
Patient notified to schedule follow up with Dr. Holley Raring per Dr. Ancil Boozer recommendation.

## 2018-10-23 NOTE — Unmapped (Signed)
Today, October 23, 2018 at 3:22 PM, I spoke with Boneta Lucks and reviewed the first half (pages 1-3) of the most recent version of the Patient Education Checklist for Placement on the Transplant Waiting List (HIM [phone]), with a revision date of June 12, 2017, including the following items:  ??? The evaluation process requirements including:  o The series of tests and consultations required to determine if I am a transplant candidate  o The patient's responsibilities, which include:  - Attending appointments as scheduled  - Relaying changes regarding health and/or insurance status to their Transplant Nurse Coordinator  o [facility] responsibilities include:    - Scheduling the required tests and appointments  - Using the evaluation results to make a decision about if transplant is a good option for me.   o Outcomes of the transplant evaluation process include:  - Determination that the patient is a candidate for transplant and will be added to the waitlist  - Determination that the patient is not a candidate for transplant as a treatment at this time. If this happens, the patient may elect to be evaluated at a center that uses different selection criteria.   - More testing is needed to determine if the patient is a candidate or not  - The decision will be communicated to the patient in writing.   o Changes to the patient's health or financial status may impact the patient's status on the waitlist (e.g., placed on hold or removed from the list) or re-evaluation may be necessary.    ??? Alternative treatment options to transplantation may include continued medical management, no further treatment, or other organ specific treatment options including: Kidney transplantation include continued medical management (peritoneal dialysis or hemodialysis), palliative care, hospice, or I may choose to select no further treatment.    ??? The potential psychosocial risks related to transplant include, but are not limited to: depression, posttraumatic stress disorder, and generalized anxiety. The patient may experience anxiety about being dependent on others or having feelings of guilt. They may also be worried about their ability to get medical, disability and/or life insurance in the future.    ??? The surgical procedure and potential medical and surgical complications related to Kidney and/or Kidney-Pancreas transplant include, but are not limited to: bleeding requiring a blood transfusion, infection, wound separation, numbness around the site of the incision, fluid collection at or around the transplanted organ, blood clots, pneumonia, organ rejection or failure that may lead to the need for re-transplant, abnormal heart rhythms, cardiovascular collapse, lifetime use of medications to suppress the immune system, multi-organ failure. Rare risks of any procedure performed under general anesthesia include heart attack, stroke and death.    ??? [facility]'s Kidney and/or Kidney-Pancreas transplant program's current outcomes, survival rates and mean waiting times published by the Scientific Registry for Transplant Recipients released on September 17, 2018 ([place]ohort Dates February 14, 2015 ??? August 12, 2017). The SRTR updates this information every 6 months and they will receive updates. This information, which is located at www.[email or web address]://www.potts.com/, can be used to evaluate the transplant center's performance. For questions on this information or help using the [facility] website, the patient was encouraged to call their Transplant Nurse Coordinator.     ??? Efforts are made before transplant to find risks in the donor. These risks may include, but are not limited to: the donors history, condition or age of the organ used, and undetected infectious diseases. The donor will have  many tests done to look for these risks. It is rare that any disease will be passed along from the donor organ, but it can happen. Bacterial infections, viral infections, HIV, fungal infections, and cancer cells are some examples.     ??? [facility]) consider some behavior to be increased risk. The patient may be offered an organ from an increased risk donor. The transplant team will let the patient know of any risk that may affect the outcome of their transplant at the time of the offer. The patient has the choice of whether or not to accept this offer.    ??? The patient will not be listed until they complete the entire transplant evaluation process and receive notification from their transplant nurse coordinator that they are placed on the waitlist or a decision has been made to not place them on the waitlist. If the decision is made not to place the patient on the waiting list, the patient will be provided a reason for the decision.     The patient or their authorized representative verbalized understanding and agreement of the informed consent elements discussed. They were given an opportunity to ask questions and we discussed the following First of half of the education checklist. Boneta Lucks verbalized agreement to begin a Kidney and/or Kidney-Pancreas transplant evaluation.     I informed Boneta Lucks that a second transplant team member will be calling shortly to verify that they've received the information and confirm there are no other questions. The patient is aware they can call [facility] for Transplant Care should they need further information or clarification at any time in the future.      Lerry Liner, RN October 23, 2018 3:22 PM  [facility]  [facility]

## 2018-10-23 NOTE — Unmapped (Signed)
Called patient to schedule evaluation appointments. Patient scheduled for Thursday, 10/31/18 for virtual appointments with letter sent.

## 2018-10-24 ENCOUNTER — Other Ambulatory Visit: Payer: Self-pay | Admitting: Nephrology

## 2018-10-24 ENCOUNTER — Ambulatory Visit
Admission: RE | Admit: 2018-10-24 | Discharge: 2018-10-24 | Disposition: A | Discharge status: Home / Self Care | Payer: PRIVATE HEALTH INSURANCE | Source: Ambulatory Visit | Attending: Nephrology | Admitting: Nephrology

## 2018-10-24 ENCOUNTER — Other Ambulatory Visit: Payer: Self-pay

## 2018-10-24 DIAGNOSIS — R0602 Shortness of breath: Secondary | ICD-10-CM

## 2018-10-24 DIAGNOSIS — N186 End stage renal disease: Secondary | ICD-10-CM

## 2018-10-24 DIAGNOSIS — Z992 Dependence on renal dialysis: Secondary | ICD-10-CM

## 2018-10-24 NOTE — Unmapped (Signed)
Today on October 24, 2018 at 12:01 PM I spoke with Boneta Lucks and confirmed that they had received education today from Wilmer Floor, Charity fundraiser. The patient had no additional questions and verbalized agreement to begin a Kidney and/or Kidney-Pancreas transplant evaluation.      Laural Benes, RN October 24, 2018 12:01 PM  [facility]  [facility]

## 2018-10-25 NOTE — Unmapped (Signed)
TFC phoned [employer] casemanager, [employer], and LVM requesteing extension for member txp eval auth which is scheduled to term at end of September. Member has been scheduled for evaluation 10/31/18.

## 2018-10-26 ENCOUNTER — Encounter: Admit: 2018-10-26 | Discharge: 2018-10-28 | Payer: MEDICARE

## 2018-10-26 DIAGNOSIS — R0600 Dyspnea, unspecified: Secondary | ICD-10-CM

## 2018-10-26 DIAGNOSIS — J189 Pneumonia, unspecified organism: Secondary | ICD-10-CM

## 2018-10-26 DIAGNOSIS — I5030 Unspecified diastolic (congestive) heart failure: Secondary | ICD-10-CM

## 2018-10-26 DIAGNOSIS — Z5329 Procedure and treatment not carried out because of patient's decision for other reasons: Secondary | ICD-10-CM

## 2018-10-26 DIAGNOSIS — I132 Hypertensive heart and chronic kidney disease with heart failure and with stage 5 chronic kidney disease, or end stage renal disease: Secondary | ICD-10-CM

## 2018-10-26 DIAGNOSIS — M109 Gout, unspecified: Secondary | ICD-10-CM

## 2018-10-26 DIAGNOSIS — D631 Anemia in chronic kidney disease: Secondary | ICD-10-CM

## 2018-10-26 DIAGNOSIS — Z8619 Personal history of other infectious and parasitic diseases: Secondary | ICD-10-CM

## 2018-10-26 DIAGNOSIS — Z21 Asymptomatic human immunodeficiency virus [HIV] infection status: Secondary | ICD-10-CM

## 2018-10-26 DIAGNOSIS — Z20828 Contact with and (suspected) exposure to other viral communicable diseases: Secondary | ICD-10-CM

## 2018-10-26 DIAGNOSIS — Z992 Dependence on renal dialysis: Secondary | ICD-10-CM

## 2018-10-26 DIAGNOSIS — I10 Essential (primary) hypertension: Secondary | ICD-10-CM

## 2018-10-26 DIAGNOSIS — N186 End stage renal disease: Secondary | ICD-10-CM

## 2018-10-26 LAB — CBC W/ AUTO DIFF
BASOPHILS RELATIVE PERCENT: 0.7 %
EOSINOPHILS ABSOLUTE COUNT: 0.2 10*9/L (ref 0.0–0.4)
EOSINOPHILS RELATIVE PERCENT: 3.1 %
HEMATOCRIT: 31.1 % — ABNORMAL LOW (ref 41.0–53.0)
HEMOGLOBIN: 10.3 g/dL — ABNORMAL LOW (ref 13.5–17.5)
LARGE UNSTAINED CELLS: 3 % (ref 0–4)
LYMPHOCYTES ABSOLUTE COUNT: 1.1 10*9/L — ABNORMAL LOW (ref 1.5–5.0)
LYMPHOCYTES RELATIVE PERCENT: 21.9 %
MEAN CORPUSCULAR HEMOGLOBIN CONC: 33.2 g/dL (ref 31.0–37.0)
MEAN CORPUSCULAR HEMOGLOBIN: 35.3 pg — ABNORMAL HIGH (ref 26.0–34.0)
MEAN CORPUSCULAR VOLUME: 106.4 fL — ABNORMAL HIGH (ref 80.0–100.0)
MEAN PLATELET VOLUME: 8.9 fL (ref 7.0–10.0)
MONOCYTES ABSOLUTE COUNT: 0.5 10*9/L (ref 0.2–0.8)
MONOCYTES RELATIVE PERCENT: 8.8 %
NEUTROPHILS ABSOLUTE COUNT: 3.2 10*9/L (ref 2.0–7.5)
NEUTROPHILS RELATIVE PERCENT: 62.9 %
PLATELET COUNT: 143 10*9/L — ABNORMAL LOW (ref 150–440)
RED BLOOD CELL COUNT: 2.93 10*12/L — ABNORMAL LOW (ref 4.50–5.90)
RED CELL DISTRIBUTION WIDTH: 19.6 % — ABNORMAL HIGH (ref 12.0–15.0)
WBC ADJUSTED: 5.1 10*9/L (ref 4.5–11.0)

## 2018-10-26 LAB — BLOOD UREA NITROGEN: Urea nitrogen:MCnc:Pt:Ser/Plas:Qn:: 45 — ABNORMAL HIGH

## 2018-10-26 LAB — BASIC METABOLIC PANEL
ANION GAP: 12 mmol/L (ref 7–15)
BLOOD UREA NITROGEN: 45 mg/dL — ABNORMAL HIGH (ref 7–21)
BUN / CREAT RATIO: 3
CALCIUM: 9.3 mg/dL (ref 8.5–10.2)
CHLORIDE: 101 mmol/L (ref 98–107)
CO2: 31 mmol/L — ABNORMAL HIGH (ref 22.0–30.0)
CREATININE: 14.87 mg/dL — ABNORMAL HIGH (ref 0.70–1.30)
EGFR CKD-EPI AA MALE: 4 mL/min/{1.73_m2} — ABNORMAL LOW (ref >=60)
GLUCOSE RANDOM: 87 mg/dL (ref 70–179)
POTASSIUM: 5.1 mmol/L — ABNORMAL HIGH (ref 3.5–5.0)
SODIUM: 144 mmol/L (ref 135–145)

## 2018-10-26 LAB — PRO-BNP: Natriuretic peptide.B prohormone N-Terminal:MCnc:Pt:Ser/Plas:Qn:: 33500 — ABNORMAL HIGH

## 2018-10-26 LAB — TROPONIN I
Troponin I.cardiac:MCnc:Pt:Ser/Plas:Qn:: <0.06
Troponin I.cardiac:MCnc:Pt:Ser/Plas:Qn:: <0.06

## 2018-10-26 LAB — HYPERSEGMENTED NEUTROPHILS

## 2018-10-26 LAB — RED BLOOD CELL COUNT: Lab: 2.93 — ABNORMAL LOW

## 2018-10-26 NOTE — Unmapped (Addendum)
Care Management  Initial Transition Planning Assessment    CM initial assessment completed telephonically as a precautionary measure in accordance with [facility] COVID-19 Pandemic emergency response plan. CM attempted to call patient in room and on his mobile no answer, patient currently being tested for Covid 19.    Type of Residence: Mailing Address:  [address]  [place] [address]  Contacts: Accompanied by: Alone  Patient Phone Number: [phone]        Medical Provider(s): Ruel Favors, MD  Reason for Admission: Admitting Diagnosis:  No admission diagnoses are documented for this encounter.  Past Medical History:   has a past medical history of Anemia, End stage kidney disease (CMS-HCC), Gout, HIV (human immunodeficiency virus infection) (CMS-HCC), Hypertension, and Renal disorder.  Past Surgical History:   has a past surgical history that includes pr lap revise intraperitoneal catheter (N/A, 05/25/2017); pr creat av fistula,non-autogenous graft (Left, 03/05/2018); chg US guide, vascular access (N/A, 03/05/2018); and pr remove peritoneal foreign body (Left, 03/05/2018).   Previous admit date: 01/27/2018    Primary Insurance- Payor: MEDCOST / Plan: MEDCOST MBS / Product Type: *No Product type* /   Secondary Insurance ??? None  Prescription Coverage ??? medcost  Preferred Pharmacy - [phone] - [place], [place] - [address]  [id number] - [place] ([place]), [place] - [address]  [facility]    Transportation home: Private vehicle  Level of function prior to admission: Independent                    General  Care Manager assessed the patient by : Medical record review, Discussion with Clinical Care team  Orientation Level: Oriented X4  Who provides care at home?: N/A  Reason for referral: Discharge Planning    Contact/Decision Maker  Extended Emergency Contact Information  Primary Emergency Contact: Jeanelle Malling  Mobile Phone: [phone] Relation: Mother  Interpreter needed? No    Legal Next of Kin / Guardian / POA / Advance Directives       Advance Directive (Medical Treatment)  Does patient have an advance directive covering medical treatment?: Patient does not have advance directive covering medical treatment.  Reason patient does not have an advance directive covering medical treatment:: Patient does not wish to complete one at this time.    Health Care Decision Maker [HCDM] (Medical & Mental Health Treatment)  Healthcare Decision Maker: HCDM documented in the HCDM/Contact Info section.    Advance Directive (Mental Health Treatment)  Does patient have an advance directive covering mental health treatment?: Patient does not have advance directive covering mental health treatment.  Reason patient does not have an advance directive covering mental health treatment:: Patient does not wish to complete one at this time.    Patient Information  Lives with: Other (Comment)((roommate))    Type of Residence: Private residence        Location/Detail: [place]    Support Systems: Parent    Responsibilities/Dependents at home?: No    Home Care services in place prior to admission?: No                  Equipment Currently Used at Home: none       Currently receiving outpatient dialysis?: Yes  Facility providing dialysis (Name/Contact Info): [facility]    Financial Information       Need for financial assistance?: No       Social Determinants  of Health  Social Determinants of Health were addressed in provider documentation.  Please refer to patient history.  Social History     Socioeconomic History   ??? Marital status: Divorced     Spouse name: None   ??? Number of children: None   ??? Years of education: None   ??? Highest education level: None   Occupational History   ??? None   Social Needs   ??? Financial resource strain: Not hard at all   ??? Food insecurity     Worry: Never true     Inability: Never true   ??? Transportation needs Medical: No     Non-medical: No   Tobacco Use   ??? Smoking status: Never Smoker   ??? Smokeless tobacco: Never Used   Substance and Sexual Activity   ??? Alcohol use: Not Currently   ??? Drug use: Not Currently   ??? Sexual activity: Yes     Partners: Female   Lifestyle   ??? Physical activity     Days per week: 7 days     Minutes per session: 90 min   ??? Stress: Not at all   Relationships   ??? Social Wellsite geologist on phone: Three times a week     Gets together: Three times a week     Attends religious service: Never     Active member of club or organization: No     Attends meetings of clubs or organizations: Never     Relationship status: Divorced   Other Topics Concern   ??? Do you use sunscreen? Yes   ??? Tanning bed use? No   ??? Are you easily burned? No   ??? Excessive sun exposure? No   ??? Blistering sunburns? No   Social History Narrative   ??? None     Housing/Utilities   ??? Within the past 12 months, have you ever stayed: outside, in a car, in a tent, in an overnight shelter, or temporarily in someone else's home (i.e. couch-surfing)? No    ??? Are you worried about losing your housing? No    ??? Within the past 12 months, have you been unable to get utilities (heat, electricity) when it was really needed? No      Literacy   ??? How often do you need to have someone help you when you read instructions, pamphlets, or other written material from your doctor or pharmacy? Never        Discharge Needs Assessment  Concerns to be Addressed: care coordination/care conferences    Clinical Risk Factors: Multiple Diagnoses (Chronic), Dialysis    Barriers to taking medications: No    Prior overnight hospital stay or ED visit in last 90 days: No    Readmission Within the Last 30 Days: no previous admission in last 30 days         Anticipated Changes Related to Illness: none    Equipment Needed After Discharge: none    Discharge Facility/Level of Care Needs: other (see comments)(home self care)    Readmission  Risk of Unplanned Readmission Score:  %  Predictive Model Details   No score data available for [facility] Risk of Unplanned Readmission     Readmitted Within the Last 30 Days? (No if blank)   Patient at risk for readmission?: Yes    Discharge Plan  Screen findings are: Care Manager reviewed the plan of the patient's care with the Multidisciplinary Team. No discharge planning needs identified at  this time. Care Manager will continue to manage plan and monitor patient's progress with the team.    Expected Discharge Date:     Expected Transfer from Critical Care: (n/a)    Patient and/or family were provided with choice of facilities / services that are available and appropriate to meet post hospital care needs?: N/A       Initial Assessment complete?: Yes    Adline Potter  October 26, 2018 3:29 PM

## 2018-10-26 NOTE — Unmapped (Signed)
Pt states he has had SOB mostly when laying down for the past week. Not as bad when doing normal activities but does get SOB with exertion. Has had a off and on cough for the past week. Had a CXR Thursday and was clear. Denies fever, chills.

## 2018-10-26 NOTE — Unmapped (Signed)
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  Worse when he lies flat and associated with swelling in his bilateral ankles.  Dialysis yesterday and had more fluid than usual taken off though he still feels short of breath.  On recent course of unspecified outpatient pneumonia. Denies fever/chills, recent illness, COVID-19 contacts.  Denies unilateral leg pain, history of blood clot, pleurisy.     Af hds   nml resp effort, ctab, no wheeze / crackles   Soft ntnd  1+edema up to ankles, symmetric    Impression: Rule out pneumonia versus ACS. Picture consistent with mild heart failure syndrome, no known diagnosis of CHF.     Labs  Troponin, EKG  X-ray  Offered COVID swab, declined as he does not want to undergo swab.  Low clinical suspicion as no other symptoms of viral syndrome.     1:06 PM   X-ray with retrocardiac opacity and stable left lower lobe opacity, c/f enlarging pna. Will admit for pna and inpatient echo as h/o suggestive of heart failure syndrome. Will cover for CAP w/ ctx ; azithro held given for prolonged QTc. Defer to primary team if want to broaden further.         Additional Medical Decision Making     I have reviewed the vital signs and the nursing notes. Labs and radiology results that were available during my care of the patient were independently reviewed by me and considered in my medical decision making.     Portions of this record have been created using Scientist, clinical (histocompatibility and immunogenetics). Dictation errors have been sought, but may not have been identified and corrected.  ____________________________________________         History        Chief Complaint Shortness of Breath      HPI   Duane [name] is a 58 y.o. 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  Worse when he lies flat and associated with swelling in his bilateral ankles.  Dialysis yesterday and had more fluid than usual taken off though he still feels short of breath.  On recent course of unspecified outpatient pneumonia. Denies fever/chills, recent illness, COVID-19 contacts.  Denies unilateral leg pain, history of blood clot, pleurisy. Denies abd pain, n/v/d. Still urinates, decreased urination lately. ROS otherwise unremarkable.      Past Medical History:   Diagnosis Date   ??? Anemia    ??? End stage kidney disease (CMS-HCC)    ??? Gout    ??? HIV (human immunodeficiency virus infection) (CMS-HCC)    ??? Hypertension    ??? Renal disorder        Patient Active Problem List   Diagnosis   ??? ESRD (end stage renal disease) on dialysis (CMS-HCC)   ??? PD catheter dysfunction, initial encounter (CMS-HCC)   ??? HTN (hypertension)   ??? HIV (human immunodeficiency virus infection) (CMS-HCC)   ??? History of  anemia due to CKD   ??? Dyspnea   ??? Prolonged Q-T interval on ECG       Past Surgical History:   Procedure Laterality Date   ??? CHG US GUIDE, VASCULAR ACCESS N/A 03/05/2018    Procedure: ULTRASOUND GUIDANCE FOR VASC ACCESS REQUIRING [place] EVAL OF POTENTIAL ACCESS SITES;  Surgeon: Leona Carry, MD;  Location: MAIN OR [facility];  Service: Transplant   ??? PR CREAT AV FISTULA,NON-AUTOGENOUS GRAFT Left 03/05/2018    Procedure: CREATE AV FISTULA (SEPARATE PROC); NONAUTOGENOUS GRAFT (EG, BIOLOGICAL COLLAGEN, THERMOPLASTIC GRAFT);  Surgeon: Leona Carry, MD;  Location: MAIN OR [facility];  Service: Transplant   ??? PR LAP REVISE INTRAPERITONEAL CATHETER N/A 05/25/2017    Procedure: Laparoscopy, Surgical; W/Revis Prev Placed Intraperitoneal Cannula/Cath, Remov Intralumin Obstruct Material;  Surgeon: Leona Carry, MD;  Location: MAIN OR [facility];  Service: Transplant   ??? PR REMOVE PERITONEAL FOREIGN BODY Left 03/05/2018    Procedure: Removal Of Peritoneal Of Foreign Body From Peritoneal Cavity;  Surgeon: Leona Carry, MD;  Location: MAIN OR [facility];  Service: Transplant       No current facility-administered medications for this encounter.     Current Outpatient Medications:   ???  amLODIPine (NORVASC) 2.5 MG tablet, Take 10 mg by mouth daily., Disp: , Rfl:   ???  apremilast (OTEZLA STARTER) 10 mg (4)-20 mg (4)-30 mg (47) DsPk, Take 1 (10mg ) tablet by mouth in the morning for 3 days, then take 1 (20mg ) tablet in the morning for 2 days, then take 1 (30mg ) tablet in the morning therafter. **DO NOT FOLLOW STARTER PACK INSTRUCTIONS - NO PM DOSES**, Disp: 55 each, Rfl: 0  ???  atenoloL (TENORMIN) 100 MG tablet, Take 100 mg by mouth daily., Disp: , Rfl:   ???  cloNIDine HCL (CATAPRES) 0.1 MG tablet, Take 0.1 mg by mouth Two (2) times a day., Disp: , Rfl:   ???  dolutegravir (TIVICAY) 50 mg Tab TABLET, Take 50 mg by mouth daily., Disp: , Rfl:   ???  doxycycline (VIBRAMYCIN) 100 MG capsule, Take 1 capsule (100 mg total) by mouth daily., Disp: 30 capsule, Rfl: 6  ???  emtricitabine-tenofovir alafen (DESCOVY) 200-25 mg tablet, Take 1 tablet by mouth daily., Disp: , Rfl:   ???  febuxostat (ULORIC) 40 mg tablet, Take 40 mg by mouth daily., Disp: , Rfl:   ???  ferric citrate (AURYXIA) 210 mg iron Tab tablet, Take 420 mg by mouth Three (3) times a day with a meal., Disp: , Rfl:   ???  ferrous sulfate 325 (65 FE) MG tablet, Take 325 mg by mouth daily., Disp: , Rfl:   ???  furosemide (LASIX) 80 MG tablet, Take 80 mg by mouth daily., Disp: , Rfl:   ???  halobetasol (ULTRAVATE) 0.05 % cream, Apply topically Two (2) times a day., Disp: 50 g, Rfl: 2  ???  halobetasol propionate 0.01 % Lotn, Apply 1 application topically daily., Disp: 60 g, Rfl: 1  ???  methocarbamoL (ROBAXIN) 500 MG tablet, Take 1,000 mg by mouth two (2) times a day as needed (muscle pain)., Disp: , Rfl:   ???  umeclidinium-vilanteroL (ANORO ELLIPTA) 62.5-25 mcg/actuation inhaler, Inhale 1 puff daily., Disp: , Rfl:   ???  valACYclovir (VALTREX) 500 MG tablet, Take 500 mg by mouth daily., Disp: , Rfl:   ???  vit B cmplx-FA-vit C-biotin, renal, (RENA-VITE RX) 1-60-300 mg-mg-mcg Tab, Take 1 tablet by mouth daily., Disp: 30 tablet, Rfl: 4    Allergies  Quinapril  Family History   Problem Relation Age of Onset   ??? Melanoma Neg Hx    ??? Squamous cell carcinoma Neg Hx    ??? Basal cell carcinoma Neg Hx        Social History  Social History     Tobacco Use   ??? Smoking status: Never Smoker   ??? Smokeless tobacco: Never Used   Substance Use Topics   ??? Alcohol use: Not Currently   ??? Drug use: Not Currently       Review of Systems  Constitutional: Negative for fever.  Eyes: Negative for visual changes.  ENT: Negative for sore throat.  Cardiovascular: Negative for chest pain.  Respiratory: +shortness of breath. Neg cough.   Gastrointestinal: Negative for abdominal pain, vomiting or diarrhea.  Genitourinary: Negative for dysuria.   Musculoskeletal: B/l lower ext swelling. Negative for back pain.  Skin: Negative for rash.  Neurological: Negative for headaches, focal weakness or numbness.       Physical Exam     This provider entered the patient's room: Yes:    ??? If this provider did not enter the room, a comprehensive physical exam was not able to be performed due to increased infection risk to themselves, other providers, staff and other patients), as well as to conserve personal protective equipment (PPE) utilization during the COVID-19 pandemic.    ??? If this provider did enter the patient room, the following was PPE worn: N95, eye protection, gown and gloves    ED Triage Vitals [10/26/18 1020]   Enc Vitals Group      BP 157/96      Heart Rate 55      SpO2 Pulse       Resp 18      Temp 36.8 ??C (98.2 ??F)      Temp Source Oral      SpO2 100 %      Weight 81.6 kg (180 lb)      Height 1.88 m (6' 2)      Head Circumference Peak Flow       Pain Score       Pain Loc       Pain Edu?       Excl. in GC?        Constitutional: Alert and oriented. Well appearing and in no distress.  Eyes: Conjunctivae are normal.  ENT       Head: Normocephalic and atraumatic.       Nose: No congestion.       Mouth/Throat: Mucous membranes are moist.       Neck: No stridor.  Hematological/Lymphatic/Immunilogical: No cervical lymphadenopathy.  Cardiovascular: Normal rate, regular rhythm. Normal and symmetric distal pulses are present in all extremities.  Respiratory: Normal respiratory effort. Breath sounds are normal.  Gastrointestinal: Soft and nontender. There is no CVA tenderness.  Musculoskeletal: Normal range of motion in all extremities.       Right lower leg: 1+ edema to the ankle. No tenderness       Left lower leg: 1+ edema to the ankle. No tenderness.  Neurologic: Normal speech and language. No gross focal neurologic deficits are appreciated.  Skin: Skin is warm, dry and intact. No rash noted.  Psychiatric: Mood and affect are normal. Speech and behavior are normal.        ______________________________________________________   Documentation assistance was provided by Margie Billet, Scribe, on October 26, 2018 at 10:36 AM for Dr. Baird Lyons    Documentation assistance was provided  by the scribe in my presence.  The documentation recorded by the scribe has been reviewed by me and accurately reflects the services I personally performed.        Myrtie Hawk, MD  10/26/18 [phone]

## 2018-10-26 NOTE — Unmapped (Addendum)
[facility]   History and Physical    Assessment/Plan:    Principal Problem:    Dyspnea  Active Problems:    ESRD (end stage renal disease) on dialysis (CMS-HCC)    HTN (hypertension)    HIV (human immunodeficiency virus infection) (CMS-HCC)    History of anemia due to CKD      Duane [name] is a 58 y.o. male with PMHx as noted below who presents to [facility] with Dyspnea.    Dyspnea, pneumonia versus volume overload from CHF exacerbaTION (HFpEF): patient with 2-3 days of worsening shortness of breath and productive cough, and based upon CXR, may be due to pneumonia. Received Rocephin in the ED. Labs unremarkable and vitals stable with no hypoxia. Based upon age, history, and BUN, his PSI/Port score is 96 (Class IV), and thus hospitalization would be appropriate to consider at this time. Could consider inpatient hospitalization, but based upon his relative hemodynamic stability will hospitalize as observation at this point. In addition, patient had an Echo in Dec 2019 that noted grade II diastolic dysfunction with severely elevated filling pressures and thus in the setting of uncontrolled HTN, Pro-BNP of 33,500, and recent lower extremity swelling, patient likely could have a CHF exacerbation. Also, patient had an inconclusive stress Echo in May 2020 that could be further evaluated if needed depending upon patient's symptoms, but will cycle biomarkers for now.  - Will treat pneumonia with Rocephin and Doxycycline (for atypicals instead on Azithro based upon his QTc)  - Supplemental oxygen as needed.  - Albuterol nebs as needed for shortness of breath.  - Cycle troponin x1 (first one negative) and monitor on telemetry x24 hours  - Echo ordered to evaluate systolic function and EF  - Nephrology aware for potential hemodialysis if needed on Monday    ESRD on HD: receives HD on MWF at [facility] in [place]. MAO spoke with Nephrology who stated that the patient is stable and can remain at [facility] for hemodialysis on Monday if needed.    HIV: patient reports that he receives his HIV care from [facility]. Currently on Tivicay and Descovy, unaware of lascdt CD4 count.    HTN: has recently had uncontrolled blood pressures with adjustment in his home regimen. Will continue home Atenolol 100 mg daily, Amlodipine 10 mg daily, and Hydralazine. According to his [facility] in [facility], he was suppose to be taking Hydralazine 50 twice daily with plans to increase to 100 mg three times daily at the current moment. Could consider increasing Hydralazine to 100 mg three times daily. Holding Lasix for now.    Gout: no evidence of flare, ordered Febuxostat (home med, but cannot remember if on hospital formulary).      Code Status:  Full Code     ___________________________________________________________________    Chief Complaint  Chief Complaint   Patient presents with   ??? Shortness of Breath       HPI:  Duane [name] is a 58 y.o. male with PMHx as noted below who presents to [facility] with Dyspnea. Patient states that he was in his normal state of health until mid week when he began noticing difficulty lying flat as he would become short of breath. He also noticed a mild productive cough that started at that time and would endorse chest pain when coughing. His feet have been more swollen as well. Denies any sick contacts. No fevers or chills. Decreased PO intake without nausea or vomiting. He still makes urine, but no  changes in urine output or bowel movements. He had a longer session of hemodialysis on Sept 4th with more fluid removed, and then missed hemodialysis on Sept 7th due to cramping in his legs. He received hemodialysis on both Wednesday and Friday this week with normal amount of fluid removed. He also had seen his PCP within the past 2-3 weeks and just recently stopped Clonidine and started Hydralazine. He does not check his blood pressure regularly.    Allergies:  Quinapril     Medications:   Prior to Admission medications    Medication Dose, Route, Frequency   amLODIPine (NORVASC) 2.5 MG tablet 10 mg, Oral, Daily (standard)   atenoloL (TENORMIN) 100 MG tablet 100 mg, Oral, Daily (standard)   dolutegravir (TIVICAY) 50 mg Tab TABLET 50 mg, Oral, Daily (standard)   emtricitabine-tenofovir alafen (DESCOVY) 200-25 mg tablet 1 tablet, Oral, Daily (standard)   febuxostat (ULORIC) 40 mg tablet 40 mg, Oral, Daily (standard)   ferric citrate (AURYXIA) 210 mg iron Tab tablet 420 mg, Oral, 3 times a day (with meals)   ferrous sulfate 325 (65 FE) MG tablet 325 mg, Oral, Daily   furosemide (LASIX) 80 MG tablet 80 mg, Oral, Daily (standard)   hydrALAZINE (APRESOLINE) 50 MG tablet 50 mg, Oral, 2 times a day   methocarbamoL (ROBAXIN) 500 MG tablet 1,000 mg, Oral, 2 times a day PRN   valACYclovir (VALTREX) 500 MG tablet 500 mg, Oral, Daily (standard)   vit B cmplx-FA-vit C-biotin, renal, (RENA-VITE RX) 1-60-300 mg-mg-mcg Tab 1 tablet, Oral, Daily (standard)       Medical History:  Past Medical History:   Diagnosis Date   ??? Anemia    ??? End stage kidney disease (CMS-HCC)    ??? Gout    ??? HIV (human immunodeficiency virus infection) (CMS-HCC)    ??? Hypertension    ??? Renal disorder        Surgical History:  Past Surgical History:   Procedure Laterality Date   ??? CHG US GUIDE, VASCULAR ACCESS N/A 03/05/2018    Procedure: ULTRASOUND GUIDANCE FOR VASC ACCESS REQUIRING [place] EVAL OF POTENTIAL ACCESS SITES;  Surgeon: Leona Carry, MD;  Location: MAIN OR [facility];  Service: Transplant   ??? PR CREAT AV FISTULA,NON-AUTOGENOUS GRAFT Left 03/05/2018    Procedure: CREATE AV FISTULA (SEPARATE PROC); NONAUTOGENOUS GRAFT (EG, BIOLOGICAL COLLAGEN, THERMOPLASTIC GRAFT);  Surgeon: Leona Carry, MD;  Location: MAIN OR [facility];  Service: Transplant   ??? PR LAP REVISE INTRAPERITONEAL CATHETER N/A 05/25/2017    Procedure: Laparoscopy, Surgical; W/Revis Prev Placed Intraperitoneal Cannula/Cath, Remov Intralumin Obstruct Material;  Surgeon: Leona Carry, MD;  Location: MAIN OR [facility];  Service: Transplant   ??? PR REMOVE PERITONEAL FOREIGN BODY Left 03/05/2018    Procedure: Removal Of Peritoneal Of Foreign Body From Peritoneal Cavity;  Surgeon: Leona Carry, MD;  Location: MAIN OR [facility];  Service: Transplant       Social History:  Social History     Social History Narrative   ??? Not on file     Social History     Tobacco Use   ??? Smoking status: Never Smoker   ??? Smokeless tobacco: Never Used   Substance Use Topics   ??? Alcohol use: Not Currently   ??? Drug use: Not Currently       Family History:  Family History   Problem Relation Age of Onset   ??? Bradycardia Maternal Grandmother    ??? Melanoma Neg Hx    ??? Squamous cell  carcinoma Neg Hx    ??? Basal cell carcinoma Neg Hx        Review of Systems:  10 systems reviewed and are negative unless otherwise mentioned in HPI      Physical Exam:  Temp:  [36.6 ??C (97.8 ??F)-36.8 ??C (98.2 ??F)] 36.6 ??C (97.8 ??F)  Heart Rate:  [53-55] 53  Resp:  [16-20] 20  BP: (157-204)/(95-102) 204/102  SpO2:  [94 %-100 %] 97 %  Body mass index is 23.11 kg/m??.    Physical Exam for 10/26/18  GEN: Pleasant male, lying in bed, appears comfortable.  HEENT: EOMI, MMM, nares and oropharynx clear.  CV: RRR, S1 and S2 normal.  LUNGS: Crackles heard at both bases, normal air movement and normal WOB  ABD: Soft, NT/ND, normoactive BS.  EXT: No cyanosis, 1-2+ edema involving both feet up to his ankles, 2+ radial and pedal pulses bilaterally.  MSK: Joint deformities involving wrists and fingers (right worse than left). Fistula in left forearm with good thrill.  NEURO: AAOx3, no focal deficits.      Test Results:  Data Review:    All lab results last 24 hours:    Recent Results (from the past 24 hour(s))   Basic Metabolic Panel    Collection Time: 10/26/18 11:39 AM   Result Value Ref Range    Sodium 144 135 - 145 mmol/L    Potassium 5.1 (H) 3.5 - 5.0 mmol/L    Chloride 101 98 - 107 mmol/L    CO2 31.0 (H) 22.0 - 30.0 mmol/L    Anion Gap 12 7 - 15 mmol/L BUN 45 (H) 7 - 21 mg/dL    Creatinine 16.10 (H) 0.70 - 1.30 mg/dL    BUN/Creatinine Ratio 3     EGFR CKD-EPI Non-African American, Male 3 (L) >=60 mL/min/1.44m2    EGFR CKD-EPI African American, Male 4 (L) >=60 mL/min/1.55m2    Glucose 87 70 - 179 mg/dL    Calcium 9.3 8.5 - 96.0 mg/dL   Pro-BNP    Collection Time: 10/26/18 11:39 AM   Result Value Ref Range    PRO-BNP 33,500.0 (H) 0.0 - 177.0 pg/mL   Troponin I    Collection Time: 10/26/18 11:40 AM   Result Value Ref Range    Troponin I <0.060 <0.060 ng/mL   CBC w/ Differential    Collection Time: 10/26/18 11:40 AM   Result Value Ref Range    WBC 5.1 4.5 - 11.0 10*9/L    RBC 2.93 (L) 4.50 - 5.90 10*12/L    HGB 10.3 (L) 13.5 - 17.5 g/dL    HCT 45.4 (L) 09.8 - 53.0 %    MCV 106.4 (H) 80.0 - 100.0 fL    MCH 35.3 (H) 26.0 - 34.0 pg    MCHC 33.2 31.0 - 37.0 g/dL    RDW 11.9 (H) 14.7 - 15.0 %    MPV 8.9 7.0 - 10.0 fL    Platelet 143 (L) 150 - 440 10*9/L    Neutrophils % 62.9 %    Lymphocytes % 21.9 %    Monocytes % 8.8 %    Eosinophils % 3.1 %    Basophils % 0.7 %    Absolute Neutrophils 3.2 2.0 - 7.5 10*9/L    Absolute Lymphocytes 1.1 (L) 1.5 - 5.0 10*9/L    Absolute Monocytes 0.5 0.2 - 0.8 10*9/L    Absolute Eosinophils 0.2 0.0 - 0.4 10*9/L    Absolute Basophils 0.0 0.0 - 0.1 10*9/L    Large  Unstained Cells 3 0 - 4 %    Macrocytosis Marked (A) Not Present    Anisocytosis Moderate (A) Not Present   Morphology Review    Collection Time: 10/26/18 11:40 AM   Result Value Ref Range    Smear Review Comments See Comment (A) Undefined    Hypersegmented Neutrophils Present (A) Not Present   ECG 12 Lead    Collection Time: 10/26/18 11:47 AM   Result Value Ref Range    EKG Systolic BP  mmHg    EKG Diastolic BP  mmHg    EKG Ventricular Rate 52 BPM    EKG Atrial Rate 52 BPM    EKG P-R Interval 146 ms    EKG QRS Duration 80 ms    EKG Q-T Interval 548 ms    EKG QTC Calculation 509 ms    EKG Calculated P Axis 26 degrees    EKG Calculated R Axis -11 degrees    EKG Calculated T Axis 75 degrees    QTC Fredericia 522 ms       Imaging: Xr Chest Portable    Result Date: 10/26/2018  EXAM: XR CHEST PORTABLE DATE: 10/26/2018 11:23 AM ACCESSION: [id number] UN DICTATED: 10/26/2018 11:23 AM INTERPRETATION LOCATION: [facility] CLINICAL INDICATION: 58 years old Male with SHORTNESS OF BREATH  COMPARISON: Chest radiograph 07/07/2018 TECHNIQUE: Portable Chest Radiograph at 1116 hours FINDINGS: Grossly similar-appearing ill-defined patchy opacity in the right lower lung zone. Streaky linear retrocardiac opacity. No pleural effusion or pneumothorax. Stable cardiomediastinal silhouette.     Left retrocardiac opacity may represent atelectasis versus infection. Stable right lower lobe ill-defined patchy opacity, likely representing fibrosis/scarring.      EKG: SINUS BRADYCARDIA, POSSIBLE LEFT ATRIAL ENLARGEMENT, T wave inversion in lateral leads, PROLONGED QT (>500). Personally reviewed by me.

## 2018-10-27 LAB — HEPATIC FUNCTION PANEL
ALBUMIN: 3.7 g/dL (ref 3.5–5.0)
ALT (SGPT): 6 U/L (ref <50)
AST (SGOT): 18 U/L — ABNORMAL LOW (ref 19–55)
BILIRUBIN DIRECT: 0.3 mg/dL (ref 0.00–0.40)
BILIRUBIN TOTAL: 0.5 mg/dL (ref 0.0–1.2)

## 2018-10-27 LAB — BASIC METABOLIC PANEL
BLOOD UREA NITROGEN: 52 mg/dL — ABNORMAL HIGH (ref 7–21)
BUN / CREAT RATIO: 3
CHLORIDE: 101 mmol/L (ref 98–107)
CO2: 29 mmol/L (ref 22.0–30.0)
CREATININE: 17.13 mg/dL — ABNORMAL HIGH (ref 0.70–1.30)
EGFR CKD-EPI AA MALE: 3 mL/min/{1.73_m2} — ABNORMAL LOW (ref >=60)
EGFR CKD-EPI NON-AA MALE: 3 mL/min/{1.73_m2} — ABNORMAL LOW (ref >=60)
GLUCOSE RANDOM: 87 mg/dL (ref 70–179)
POTASSIUM: 5.6 mmol/L — ABNORMAL HIGH (ref 3.5–5.0)
SODIUM: 144 mmol/L (ref 135–145)

## 2018-10-27 LAB — CBC
HEMATOCRIT: 31.6 % — ABNORMAL LOW (ref 41.0–53.0)
HEMOGLOBIN: 10.6 g/dL — ABNORMAL LOW (ref 13.5–17.5)
MEAN CORPUSCULAR VOLUME: 105.9 fL — ABNORMAL HIGH (ref 80.0–100.0)
MEAN PLATELET VOLUME: 8.5 fL (ref 7.0–10.0)
PLATELET COUNT: 184 10*9/L (ref 150–440)
RED BLOOD CELL COUNT: 2.99 10*12/L — ABNORMAL LOW (ref 4.50–5.90)
WBC ADJUSTED: 6.3 10*9/L (ref 4.5–11.0)

## 2018-10-27 LAB — MAGNESIUM: Magnesium:MCnc:Pt:Ser/Plas:Qn:: 2

## 2018-10-27 LAB — PHOSPHORUS
Phosphate:MCnc:Pt:Ser/Plas:Qn:: 5.3 — ABNORMAL HIGH
Phosphate:MCnc:Pt:Ser/Plas:Qn:: 5.8 — ABNORMAL HIGH

## 2018-10-27 LAB — PARATHYROID HOMONE (PTH): PARATHYROID HORMONE INTACT: 322.6 pg/mL — ABNORMAL HIGH (ref 12.0–72.0)

## 2018-10-27 LAB — MEAN CORPUSCULAR VOLUME: Lab: 105.9 — ABNORMAL HIGH

## 2018-10-27 LAB — PARATHYROID HORMONE INTACT: Parathyrin.intact:MCnc:Pt:Ser/Plas:Qn:: 322.6 — ABNORMAL HIGH

## 2018-10-27 LAB — AST (SGOT): Aspartate aminotransferase:CCnc:Pt:Ser/Plas:Qn:: 18 — ABNORMAL LOW

## 2018-10-27 LAB — CREATININE: Creatinine:MCnc:Pt:Ser/Plas:Qn:: 17.13 — ABNORMAL HIGH

## 2018-10-27 NOTE — Unmapped (Signed)
Problem: Adult Inpatient Plan of Care  Goal: Plan of Care Review  Outcome: Progressing  Goal: Patient-Specific Goal (Individualization)  Outcome: Progressing  Goal: Absence of Hospital-Acquired Illness or Injury  Outcome: Progressing  Goal: Optimal Comfort and Wellbeing  Outcome: Progressing  Goal: Readiness for Transition of Care  Outcome: Progressing  Goal: Rounds/Family Conference  Outcome: Progressing

## 2018-10-27 NOTE — Unmapped (Signed)
Hospital Medicine Daily Progress Note    Assessment/Plan:    Principal Problem:    Dyspnea  Active Problems:    ESRD (end stage renal disease) (CMS-HCC)    HTN (hypertension)    HIV (human immunodeficiency virus infection) (CMS-HCC)    History of anemia due to CKD  Resolved Problems:    * No resolved hospital problems. *                 Duane [name] is a 58 y.o. male that presented to [facility] with Dyspnea.    58 y/o M with ESRD on HD MWF, HTN, HIV who was admitted on 09/12 for several days of dyspnea on exertion, orthopnea and peripheral edema consistent with volume overload.   ??  Volume overload: Presented with 3 days of exertional dyspnea, peripheral edema, and orthopnea.   Etiology unclear. Missed session in the past week also reports difficulty removing fluid due to cramping. No change in diet/fluid intake. Does make urine. Recently changed from clonidine to hydralazine and has known grade 2 DD on prior TTE so possible develops acute diastolic HF exacerbation in setting of poorly controlled HTN. Had asymmetric infiltrate noted on CXR but afebrile and without leukocytosis. Also with markedly elevated pro-BNP. Most consistent with acute diastolic HF exacerbation.   - stop antibiotics  - lasix 80mg  IV x 1 now; goal net negative 1-2L; depending on output may need 2nd dose today  - strict I/Os, daily weights, 2g Na restricted diet  - improved HTN control as below  - HD tomorrow for UF  - update TTE    ESRD on HD: MWF at [facility] in [place].   MAO spoke with Nephrology who stated that the patient is stable and can remain at [facility] for hemodialysis on Monday if needed. Hgb 10s  - ferric gluconate unavailable, start phoslo 667mg  TID for phos binder  - check PTH  - low Phos diet  - HD tomorrow  ??  HIV: Unknown control   Receives his HIV care from [facility]. Currently on Tivicay and Descovy. Patient unaware of last CD4 count. In Care Everywhere, was 207 in 09/2016.  - continue outpatient meds  - will update CD4 count (add on)  ??  HTN: has recently had uncontrolled blood pressures with adjustment in his home regimen.   - continue Atenolol 100mg  daily, Amlodipine 10 mg daily  - increase Hydralazine to 100mg  TID   - lasix as above   ??  Gout: no evidence of flare, continue Febuxostat     Floor time 30 minutes, > 50% spent in counseling and coordination of care about the following issues:  volume overload, IV diuretics, discussions with patient, nephrology, RN.  ___________________________________________________________________    Subjective:  No acute events. Notes inability to lay flat due to dyspnea, improved with sitting upright. Also lower extremity edema new over the past 3 days. No fever, chest pain. Minimal cough. No PND or abdominal swelling. Takes diuretics daily. Does make urine. Missed HD session earlier this week and sometimes UF limited by cramping per patient.    Labs/Studies:  Labs and Studies from the last 24hrs per EMR and Reviewed    Objective:  Temp:  [35.7 ??C (96.3 ??F)-36.9 ??C (98.4 ??F)] 36.6 ??C (97.8 ??F)  Heart Rate:  [47-65] 63  Resp:  [14-20] 18  BP: (175-205)/(88-117) 205/117  SpO2:  [96 %-100 %] 100 %    GEN: NAD, sitting up in bed  EYES: anicteric sclerae   ENT:  OP moist, no lesions  Neck: JVP @ mid-neck @ 45 degrees   CV: normal rate, regular rhythm, no murmurs  PULM: clear, good air entry, normal work of breathing  ABD: soft, +BS, NT/ND   EXT: warm, symmetric 1+ peripheral edema; LUE fistula  SKIN: no rashes  Neuro: alert, fully oriented, no new deficits

## 2018-10-27 NOTE — Unmapped (Signed)
Pt stable and denies any pain.  Remains on telemetry. No c/o of sob.  SCD's placed on at HS. No requests/concerns at this time. Side table and call bell near. Bed low. No falls during this shift. Wctm.    Problem: Adult Inpatient Plan of Care  Goal: Plan of Care Review  Outcome: Progressing  Goal: Patient-Specific Goal (Individualization)  Outcome: Progressing  Goal: Absence of Hospital-Acquired Illness or Injury  Outcome: Progressing  Goal: Optimal Comfort and Wellbeing  Outcome: Progressing  Goal: Readiness for Transition of Care  Outcome: Progressing  Goal: Rounds/Family Conference  Outcome: Progressing     Problem: COPD Comorbidity  Goal: Maintenance of COPD Symptom Control  Outcome: Progressing     Problem: Heart Failure Comorbidity  Goal: Maintenance of Heart Failure Symptom Control  Outcome: Progressing

## 2018-10-27 NOTE — Unmapped (Addendum)
Patient admitted for pneumonia.  Elevated blood pressure, BP medication given, reassess.  Alert and oriented, no complaints of pain.  3+ edema in BLE (feet, ankles).  Denies shortness of breath on exertion.  Ambulated with staff nurse of unit.  Reports feeling SOB when lying back.  Voiding.  Call bell in reach, continue to monitor.   Problem: Adult Inpatient Plan of Care  Goal: Plan of Care Review  Outcome: Progressing  Goal: Patient-Specific Goal (Individualization)  Outcome: Progressing  Goal: Absence of Hospital-Acquired Illness or Injury  Outcome: Progressing  Goal: Optimal Comfort and Wellbeing  Outcome: Progressing  Goal: Readiness for Transition of Care  Outcome: Progressing  Goal: Rounds/Family Conference  Outcome: Progressing

## 2018-10-28 LAB — ABSOLUTE CD4 CNT: Cells.CD3+CD4+:NCnc:Pt:XXX:Qn:: 451 — ABNORMAL LOW

## 2018-10-28 LAB — LYMPH MARKER LIMITED,FLOW
ABSOLUTE CD8 CNT: 288 {cells}/uL (ref 180–1520)
CD3% (T CELLS)": 59 % — ABNORMAL LOW (ref 61–86)
CD4% (T HELPER)": 36 % (ref 34–58)
CD4:CD8 RATIO: 1.6 (ref 0.9–4.8)
CD8% T SUPPRESR": 23 % (ref 12–38)

## 2018-10-28 MED ORDER — Medication
2.00 | Status: DC
Start: ? — End: 2018-10-28

## 2018-10-28 MED ORDER — VITAMIN B-COMPLEX PO TABS
1.00 | ORAL_TABLET | ORAL | Status: DC
Start: 2018-10-28 — End: 2018-10-28

## 2018-10-28 MED ORDER — OLANZAPINE-FLUOXETINE HCL 6-50 MG PO CAPS
6.00 | ORAL_CAPSULE | ORAL | Status: DC
Start: ? — End: 2018-10-28

## 2018-10-28 MED ORDER — ALUM & MAG HYDROXIDE-SIMETH 400-400-40 MG/5ML PO SUSP
30.00 | ORAL | Status: DC
Start: ? — End: 2018-10-28

## 2018-10-28 MED ORDER — Medication
10.00 | Status: DC
Start: 2018-10-28 — End: 2018-10-28

## 2018-10-28 MED ORDER — HEART 140 MG PO TABS
0.50 | ORAL_TABLET | ORAL | Status: DC
Start: ? — End: 2018-10-28

## 2018-10-28 MED ORDER — PTS PANELS GLUCOSE TEST VI STRP
40.00 | ORAL_STRIP | Status: DC
Start: 2018-10-28 — End: 2018-10-28

## 2018-10-28 MED ORDER — DARK TANNING SPF4 EX
50.00 | CUTANEOUS | Status: DC
Start: 2018-10-28 — End: 2018-10-28

## 2018-10-28 MED ORDER — FERROUS SULFATE 325 (65 FE) MG PO TABS
325.00 | ORAL_TABLET | ORAL | Status: DC
Start: 2018-10-28 — End: 2018-10-28

## 2018-10-28 MED ORDER — GENERIC EXTERNAL MEDICATION
Status: DC
Start: ? — End: 2018-10-28

## 2018-10-28 MED ORDER — CALCIUM ACETATE (PHOS BINDER) 667 MG PO CAPS
667.00 | ORAL_CAPSULE | ORAL | Status: DC
Start: 2018-10-28 — End: 2018-10-28

## 2018-10-28 MED ORDER — IFOSFAMIDE IV
1000.00 | INTRAVENOUS | Status: DC
Start: ? — End: 2018-10-28

## 2018-10-28 MED ORDER — PYRITHIONE ZN & SALICYLIC ACID EX
1.00 | CUTANEOUS | Status: DC
Start: 2018-10-28 — End: 2018-10-28

## 2018-10-28 MED ORDER — VICON FORTE PO CAPS
17.00 | ORAL_CAPSULE | ORAL | Status: DC
Start: ? — End: 2018-10-28

## 2018-10-28 MED ORDER — BURN RELIEF ALOE EX
200.00 | CUTANEOUS | Status: DC
Start: ? — End: 2018-10-28

## 2018-10-28 MED ORDER — INFLUENZA VAC SPLIT QUAD 0.5 ML IM SUSY
0.50 | PREFILLED_SYRINGE | INTRAMUSCULAR | Status: DC
Start: ? — End: 2018-10-28

## 2018-10-28 MED ORDER — YALE REUSABLE NEEDLE 25G X 1" MISC
100.00 | Status: DC
Start: 2018-10-28 — End: 2018-10-28

## 2018-10-28 MED ORDER — ACETAMINOPHEN 325 MG PO TABS
650.00 | ORAL_TABLET | ORAL | Status: DC
Start: ? — End: 2018-10-28

## 2018-10-28 MED ORDER — ALBUTEROL SULFATE (2.5 MG/3ML) 0.083% IN NEBU
2.50 | INHALATION_SOLUTION | RESPIRATORY_TRACT | Status: DC
Start: ? — End: 2018-10-28

## 2018-10-28 MED ORDER — DIMETHICONE-TRICLOSAN EX
100.00 | CUTANEOUS | Status: DC
Start: 2018-10-28 — End: 2018-10-28

## 2018-10-28 MED ORDER — GRANDPAS INDIAN CORN SOAP EX
500.00 | CUTANEOUS | Status: DC
Start: 2018-10-28 — End: 2018-10-28

## 2018-10-28 MED ORDER — AMLODIPINE 10 MG TABLET
ORAL_TABLET | Freq: Every day | ORAL | 3 refills | 90.00000 days | Status: CP
Start: 2018-10-28 — End: 2018-10-28

## 2018-10-28 MED ORDER — AMLODIPINE 10 MG TABLET: 10 mg | tablet | Freq: Every day | 3 refills | 90 days | Status: AC

## 2018-10-28 MED ORDER — HYDRALAZINE 100 MG TABLET
ORAL_TABLET | Freq: Three times a day (TID) | ORAL | 0 refills | 30.00000 days | Status: CP
Start: 2018-10-28 — End: 2018-11-27

## 2018-10-28 MED ORDER — HYDRALAZINE 100 MG TABLET: 100 mg | tablet | Freq: Three times a day (TID) | 0 refills | 30 days

## 2018-10-28 NOTE — Unmapped (Signed)
V/m received from [employer], [employer], with eval auth extension for member. Per Garth Bigness has been extended to 02/12/2019

## 2018-10-28 NOTE — Unmapped (Addendum)
Physician Discharge Summary HBR  3 BT1 HBR  [address]  [place] [address]  Dept: [phone]  Loc: [phone]     Identifying Information:   Duane [name]  [birth year]  [id number]    Primary Care Physician: Prince Rome, MD     Code Status: Full Code    Admit Date: 10/26/2018    Discharge Date: 10/28/2018    Discharge To: Against Medical Advice    Discharge Service: HBR - HBC: Hospitalist Service #2     Discharge Attending Physician: Reyes Ivan MD    Discharge Diagnoses:  Principal Problem:    Dyspnea POA: Yes  Active Problems:    ESRD (end stage renal disease) (CMS-HCC) POA: Yes    HTN (hypertension) POA: Yes    HIV (human immunodeficiency virus infection) (CMS-HCC) POA: Yes    History of anemia due to CKD POA: Not Applicable  Resolved Problems:    * No resolved hospital problems. *      Outpatient Provider Follow Up Issues:   - Patient needs dialysis on 10/28/2018 at his home dialysis unit in [place]   - Patient will follow up with transplant nephrology on Thursday 10/31/2018   - Patient's primary nephrologist (Dr Austin Miles) messaged regarding discharge AMA   - In follow up with nephrology, assess current antihypertensive and diuretic regimen following dialysis   - Patient was scheduled for echocardiogram but left prior to this being completed, concern that he has had worsening of his diastolic dysfunction so may benefit from having this completed outpatient    Hospital Course:   58 y/o M with ESRD on HD MWF, HTN, HIV who was admitted on 10/26/18 for several days of dyspnea on exertion, orthopnea and peripheral edema consistent with volume overload. Ultimately volume overload was felt to be multifactorial, with missed hemodialysis session and uncontrolled hypertension in the setting of known diastolic dysfunction both contributing.  During the admission he was treated with IV diuretics as he does still make urine. Antihypertensive regimen was adjusted to achieve better control of blood pressure, including increasing his dose of amlodipine (2.5mg  daily to 10mg  daily) and hydralazine (50mg  TID to 100mg  TID). He was seen by nephrology and plan was made for dialysis with volume removal on Monday and also was to have repeat echocardiogram.  Unfortunately, patient was adamant about leaving the hospital on Sunday evening, and ultimately chose to leave against medical advice despite efforts to explain why it would be best for him to stay and complete his treatment and his work up.  On discharge, recommended continuing home medications with increase to amlodipine and hydralazine to hospital doses.  He will need close follow up.  Primary and transplant nephrologist were made aware of patient's decision to leave AMA.  He does have a follow up appointment scheduled with Dr Gwynneth Munson of transplant nephrology later this week.     Procedures:  No admission procedures for hospital encounter.  ______________________________________________________________________  Discharge Medications:     Your Medication List      CHANGE how you take these medications    amLODIPine 10 MG tablet  Commonly known as: NORVASC  Take 1 tablet (10 mg total) by mouth daily.  What changed: medication strength     hydrALAZINE 100 MG tablet  Commonly known as: APRESOLINE  Take 1 tablet (100 mg total) by mouth Three (3) times a day.  What changed:   ?? medication strength  ?? how much to take  CONTINUE taking these medications    atenoloL 100 MG tablet  Commonly known as: TENORMIN  Take 100 mg by mouth daily.     AURYXIA 210 mg iron Tab tablet  Generic drug: ferric citrate  Take 420 mg by mouth Three (3) times a day with a meal.     DESCOVY 200-25 mg tablet  Generic drug: emtricitabine-tenofovir alafen  Take 1 tablet by mouth daily.     dolutegravir 50 mg Tab TABLET  Commonly known as: TIVICAY  Take 50 mg by mouth daily.     ferrous sulfate 325 (65 FE) MG tablet  Take 325 mg by mouth daily.     furosemide 80 MG tablet  Commonly known as: LASIX  Take 80 mg by mouth daily.     methocarbamoL 500 MG tablet  Commonly known as: ROBAXIN  Take 1,000 mg by mouth two (2) times a day as needed (muscle pain).     ULORIC 40 mg tablet  Generic drug: febuxostat  Take 40 mg by mouth daily.     valACYclovir 500 MG tablet  Commonly known as: VALTREX  Take 500 mg by mouth daily.     vit B cmplx-FA-vit C-biotin (renal) 1-60-300 mg-mg-mcg Tab  Commonly known as: RENA-VITE RX  Take 1 tablet by mouth daily.            Allergies:  Quinapril  ______________________________________________________________________  Pending Test Results (if blank, then none):      Most Recent Labs:  All lab results last 24 hours -   No results found for this or any previous visit (from the past 24 hour(s)).    Relevant Studies/Radiology (if blank, then none):  Xr Chest Portable    Result Date: 10/26/2018  EXAM: XR CHEST PORTABLE DATE: 10/26/2018 11:23 AM ACCESSION: [id number] UN DICTATED: 10/26/2018 11:23 AM INTERPRETATION LOCATION: Main Campus CLINICAL INDICATION: 58 years old Male with SHORTNESS OF BREATH  COMPARISON: Chest radiograph 07/07/2018 TECHNIQUE: Portable Chest Radiograph at 1116 hours FINDINGS: Grossly similar-appearing ill-defined patchy opacity in the right lower lung zone. Streaky linear retrocardiac opacity. No pleural effusion or pneumothorax. Stable cardiomediastinal silhouette.     Left retrocardiac opacity may represent atelectasis versus infection. Stable right lower lobe ill-defined patchy opacity, likely representing fibrosis/scarring.    ______________________________________________________________________  Discharge Instructions:   Activity Instructions     Activity as tolerated            Diet Instructions     Discharge diet (specify)      Discharge Nutrition Therapy: Renal    Please follow a sodium and potassium and phosphorous restricted diet               Other Instructions     Call MD for:      Extreme fatigue, headaches, blurry vision, shortness of breath Discharge instructions      Duane [name] have chosen to be discharged to home against medical advice.  While we would prefer to continue your care in the inpatient setting, we would like to do everything we can to ensure that you continue to get the appropriate care for your renal disease and heart failure after leaving the hospital.  It is very important that you follow up with your primary nephrologist.  I will send a message to the nephrology team so that they are aware that you went home and can make any adjustments to your treatment plan as needed.  It is also very important that you get dialysis tomorrow  as scheduled at your home dialysis unit.  You have evidence of volume overload, high blood pressure and lab work that suggests that you will need dialysis in the next 24 hours.     You have had a couple of changes made to your medications.  Please review the discharge medication list.  The changes made are to your antihypertensive regimen.  You should increase your dose of amlodipine and your dose of hydralazine to improve control of your blood pressure. We will send these to your pharmacy first thing in the morning for pick up.    Please know that if you have any worsening of your symptoms or for any reason are unable to get dialysis tomorrow you may return to [facility] for your care.               Follow Up instructions and Outpatient Referrals     Call MD for:      Discharge instructions            Appointments which have been scheduled for you    Nov 07, 2018  2:30 PM  (Arrive by 2:00 PM)  VIDEO VISIT- OTHER with Gabriela Eves  [facility] KIDNEY TRANSPLANT [facility]) [address]  [place] [address]  [phone]           ______________________________________________________________________  Discharge Day Services:  BP 210/108 Comment: RN notified - Pulse 65  - Temp 36.7 ??C (98.1 ??F) (Temporal)  - Resp 18  - Ht 188 cm (6' 2)  - Wt 83 kg (183 lb)  - SpO2 95%  - BMI 23.50 kg/m??      Patient left against medical advice. Patient was breathing comfortably and ambulating without difficulty, alert and oriented.  Full examination could not be performed before patient left the hospital.     Condition at Discharge: stable    Length of Discharge: I spent greater than 30 mins in the discharge of this patient.

## 2018-10-28 NOTE — Unmapped (Signed)
Patient upset that his home medication, ferric citrate, has been replaced with PhosLo and he states that this medicine is not helping him.  He states that his body is hurting because he does not have his home medicine.  Patient is ready to leave because of this.      Patient educated on risks of leaving AMA.  Patient also educated on other alternatives to his pain.  MD paged    Patient accepted this and signed the [facility] paperwork.  Patient also refused to talk with covering [facility] provider.     IV and telemetry box removed.  Patient said he had a ride waiting for him downstairs.

## 2018-10-28 NOTE — Unmapped (Signed)
TFC created a consultation letter and sent it to the patient prior to our phone consultation on October 28, 2018

## 2018-10-28 NOTE — Unmapped (Addendum)
Patient alert and oriented, elevated blood pressure during shift.  Decreasing with blood pressure medication.  Received a total of 180 mg of IV lasix during shift.  Voided x 1 during shift, total void was 400 ml however, previous void (prior shift) was also in this amount.  Total 740 mg, oral intake during shift and 67 ml of IV fluid.  Ambulating without difficulty.  Patient pending dialysis and Echo tomorrow.  Denies shortness of breath, chest pain. Call bell in reach, continue to monitor.  Problem: Adult Inpatient Plan of Care  Goal: Plan of Care Review  Outcome: Progressing  Goal: Patient-Specific Goal (Individualization)  Outcome: Progressing  Goal: Absence of Hospital-Acquired Illness or Injury  Outcome: Progressing  Goal: Optimal Comfort and Wellbeing  Outcome: Progressing  Goal: Readiness for Transition of Care  Outcome: Progressing  Goal: Rounds/Family Conference  Outcome: Progressing     Problem: Asthma Comorbidity  Goal: Maintenance of Asthma Control  Outcome: Progressing     Problem: COPD Comorbidity  Goal: Maintenance of COPD Symptom Control  Outcome: Progressing     Problem: Diabetes Comorbidity  Goal: Blood Glucose Level Within Desired Range  Outcome: Progressing     Problem: Heart Failure Comorbidity  Goal: Maintenance of Heart Failure Symptom Control  Outcome: Progressing     Problem: Hypertension Comorbidity  Goal: Blood Pressure in Desired Range  Outcome: Progressing     Problem: Obstructive Sleep Apnea Risk or Actual (Comorbidity Management)  Goal: Unobstructed Breathing During Sleep  Outcome: Progressing     Problem: Pain Chronic (Persistent) (Comorbidity Management)  Goal: Acceptable Pain Control and Functional Ability  Outcome: Progressing     Problem: Seizure Disorder Comorbidity  Goal: Maintenance of Seizure Control  Outcome: Progressing     Problem: Fluid Imbalance (Pneumonia)  Goal: Fluid Balance  Outcome: Progressing     Problem: Infection (Pneumonia)  Goal: Resolution of Infection Signs/Symptoms  Outcome: Progressing     Problem: Respiratory Compromise (Pneumonia)  Goal: Effective Oxygenation and Ventilation  Outcome: Progressing     Problem: Fluid Imbalance (Pneumonia)  Goal: Fluid Balance  Outcome: Progressing     Problem: Infection (Pneumonia)  Goal: Resolution of Infection Signs/Symptoms  Outcome: Progressing     Problem: Respiratory Compromise (Pneumonia)  Goal: Effective Oxygenation and Ventilation  Outcome: Progressing     Problem: HIV/AIDS Infection  Goal: HIV/AIDS Symptom Control  Outcome: Progressing     Problem: Wound  Goal: Optimal Wound Healing  Outcome: Progressing

## 2018-10-28 NOTE — Unmapped (Signed)
Nephrology ESRD Consultation Note    Requesting attending physician: Dellia Cloud, MD  Service requesting consult:  [facility] Hospitalist Service #2.  Reason for consult: ESRD, provision of dialysis    Outpatient dialysis unit: [facility]  Outpatient dialysis schedule: MWF    Assessment/Recommendations: Duane [name] is a 58 y.o. male with poorly controlled Hypertension, diastolic heart failure, HIV, gout and ESRD who presented 10/26/2018 with Dyspnea.  He missed one dialysis treatment in the past week. He continues to make urine.     Physical exam not performed as patient left AMA prior to dialysis treatment.    # ESRD:  4 hours. 2 K, 2.5 calcium, 137 sodium, 35 meq bicarbonate.  180 dialyzer.  Heparin 2000 units/ 60 units per hour.    # Volume/ hypertension:  EDW  84 kg.   BP 180-200s/90s to 100s, HR 60s. According to his outpatient dialysis center, he routinely misses dialysis treatments.  He needs his EDW challenged but pt will not al.  We will challenge his EDW if pt allows.    # Anemia: Hgb 10.6.  Receives Epogen 4000 units with treatments which we will continue.    # Bone-mineral disease: Calcium 9.1, phosphorus 5.8.  Receives Hectoral 1 mcg with treatments.    # Vascular access: LUE AVF     # Hepatitis status: Hepatitis b sAg negative 10/07/2018.     # Additional recommendations:  -Avoid nephrotoxic drugs; dose all meds for creatinine clearance < 10 ml/min   -Unless absolutely necessary, no MRIs with gadolinium or dotorem.   -Implement save left arm precautions.  Prefer needle sticks in the dorsum of the hands or wrists.  No BP measurements in left arm.    **Please contact Quin Hoop or the on-call nephrology fellow PRIOR to hospital discharge so that the nephrology team can arrange appropriate dialysis-related follow-up.**    ==================================================================    History of Present Illness: Duane [name] is a 58 y.o. male with poorly controlled Hypertension, diastolic heart failure, HIV, gout and ESRD who presented 10/26/2018 with Dyspnea.    Past Medical History:  Past Medical History:   Diagnosis Date   ??? Anemia    ??? End stage kidney disease (CMS-HCC)    ??? Gout    ??? HIV (human immunodeficiency virus infection) (CMS-HCC)    ??? Hypertension    ??? Renal disorder          Past Surgical History:  Past Surgical History:   Procedure Laterality Date   ??? CHG US GUIDE, VASCULAR ACCESS N/A 03/05/2018    Procedure: ULTRASOUND GUIDANCE FOR VASC ACCESS REQUIRING [place] EVAL OF POTENTIAL ACCESS SITES;  Surgeon: Leona Carry, MD;  Location: MAIN OR [facility];  Service: Transplant   ??? PR CREAT AV FISTULA,NON-AUTOGENOUS GRAFT Left 03/05/2018    Procedure: CREATE AV FISTULA (SEPARATE PROC); NONAUTOGENOUS GRAFT (EG, BIOLOGICAL COLLAGEN, THERMOPLASTIC GRAFT);  Surgeon: Leona Carry, MD;  Location: MAIN OR [facility];  Service: Transplant   ??? PR LAP REVISE INTRAPERITONEAL CATHETER N/A 05/25/2017    Procedure: Laparoscopy, Surgical; W/Revis Prev Placed Intraperitoneal Cannula/Cath, Remov Intralumin Obstruct Material;  Surgeon: Leona Carry, MD;  Location: MAIN OR [facility];  Service: Transplant   ??? PR REMOVE PERITONEAL FOREIGN BODY Left 03/05/2018    Procedure: Removal Of Peritoneal Of Foreign Body From Peritoneal Cavity;  Surgeon: Leona Carry, MD;  [facility];  Service: Transplant       Allergies:  Quinapril    Medications:   No current facility-administered medications  for this encounter.      Current Outpatient Medications   Medication Sig Dispense Refill   ??? atenoloL (TENORMIN) 100 MG tablet Take 100 mg by mouth daily.     ??? dolutegravir (TIVICAY) 50 mg Tab TABLET Take 50 mg by mouth daily.     ??? emtricitabine-tenofovir alafen (DESCOVY) 200-25 mg tablet Take 1 tablet by mouth daily.     ??? febuxostat (ULORIC) 40 mg tablet Take 40 mg by mouth daily.     ??? ferric citrate (AURYXIA) 210 mg iron Tab tablet Take 420 mg by mouth Three (3) times a day with a meal.     ??? ferrous sulfate 325 (65 FE) MG tablet Take 325 mg by mouth daily.     ??? furosemide (LASIX) 80 MG tablet Take 80 mg by mouth daily.     ??? valACYclovir (VALTREX) 500 MG tablet Take 500 mg by mouth daily.     ??? vit B cmplx-FA-vit C-biotin, renal, (RENA-VITE RX) 1-60-300 mg-mg-mcg Tab Take 1 tablet by mouth daily. 30 tablet 4   ??? amLODIPine (NORVASC) 10 MG tablet Take 1 tablet (10 mg total) by mouth daily. 90 tablet 3   ??? hydrALAZINE (APRESOLINE) 100 MG tablet Take 1 tablet (100 mg total) by mouth Three (3) times a day. 90 tablet 0   ??? methocarbamoL (ROBAXIN) 500 MG tablet Take 1,000 mg by mouth two (2) times a day as needed (muscle pain).         Social History:  Tobacco use: denies  Alcohol use: denies  Drug use: denies  Living situation: the patient lives in [place].    Family History:  The patient's family history includes Bradycardia in his maternal grandmother..    Review of Systems:  10 systems reviewed and negative unless stated otherwise in HPI    Physical Exam:  Blood pressure 210/108, pulse 65, temperature 36.7 ??C (98.1 ??F), temperature source Temporal, resp. rate 18, height 188 cm (6' 2), weight 83 kg (183 lb), SpO2 95 %.    Test Results  Data Review:    All lab results last 24 hours:    Recent Results (from the past 24 hour(s))   CBC    Collection Time: 10/27/18 12:00 PM   Result Value Ref Range    WBC 6.3 4.5 - 11.0 10*9/L    RBC 2.99 (L) 4.50 - 5.90 10*12/L    HGB 10.6 (L) 13.5 - 17.5 g/dL    HCT 16.1 (L) 09.6 - 53.0 %    MCV 105.9 (H) 80.0 - 100.0 fL    MCH 35.5 (H) 26.0 - 34.0 pg    MCHC 33.5 31.0 - 37.0 g/dL    RDW 04.5 (H) 40.9 - 15.0 %    MPV 8.5 7.0 - 10.0 fL    Platelet 184 150 - 440 10*9/L   Hepatic Function Panel    Collection Time: 10/27/18 12:00 PM   Result Value Ref Range    Albumin 3.7 3.5 - 5.0 g/dL    Total Protein 6.7 6.5 - 8.3 g/dL    Total Bilirubin 0.5 0.0 - 1.2 mg/dL    Bilirubin, Direct 8.11 0.00 - 0.40 mg/dL    AST 18 (L) 19 - 55 U/L    ALT 6 <50 U/L    Alkaline Phosphatase 59 38 - 126 U/L Parathyroid Hormone (PTH)    Collection Time: 10/27/18 12:00 PM   Result Value Ref Range    PTH 322.6 (H) 12.0 - 72.0 pg/mL  Calcium 9.1 8.5 - 10.2 mg/dL   Basic Metabolic Panel    Collection Time: 10/27/18 12:00 PM   Result Value Ref Range    Sodium 144 135 - 145 mmol/L    Potassium 5.6 (H) 3.5 - 5.0 mmol/L    Chloride 101 98 - 107 mmol/L    CO2 29.0 22.0 - 30.0 mmol/L    Anion Gap 14 7 - 15 mmol/L    BUN 52 (H) 7 - 21 mg/dL    Creatinine 16.10 (H) 0.70 - 1.30 mg/dL    BUN/Creatinine Ratio 3     EGFR CKD-EPI Non-African American, Male 3 (L) >=60 mL/min/1.69m2    EGFR CKD-EPI African American, Male 3 (L) >=60 mL/min/1.22m2    Glucose 87 70 - 179 mg/dL    Calcium 9.1 8.5 - 96.0 mg/dL   Magnesium Level    Collection Time: 10/27/18 12:00 PM   Result Value Ref Range    Magnesium 2.0 1.6 - 2.2 mg/dL   Phosphorus Level    Collection Time: 10/27/18 12:00 PM   Result Value Ref Range    Phosphorus 5.8 (H) 2.9 - 4.7 mg/dL     ECG: 4/54/0981  SINUS BRADYCARDIA  POSSIBLE LEFT ATRIAL ENLARGEMENT   T WAVE ABNORMALITY, CONSIDER LATERAL ISCHEMIA  PROLONGED QT  ABNORMAL ECG  WHEN COMPARED WITH ECG OF 15-Jun-2018 12:53,  NONSPECIFIC T WAVE ABNORMALITY NOW EVIDENT IN INFERIOR LEADS  T WAVE INVERSION NOW EVIDENT IN LATERAL LEADS  Imaging: CXR 10/26/2018  FINDINGS:   Grossly similar-appearing ill-defined patchy opacity in the right lower lung zone. Streaky linear retrocardiac opacity.  No pleural effusion or pneumothorax.  Stable cardiomediastinal silhouette.   IMPRESSION:  Left retrocardiac opacity may represent atelectasis versus infection.  ??Stable right lower lobe ill-defined patchy opacity, likely representing fibrosis/scarring    Current Medications    Current Facility-Administered Medications:   ???  acetaminophen (TYLENOL) tablet 650 mg, 650 mg, Oral, Q4H PRN, Eloise Levels, MD  ???  albuterol 2.5 mg /3 mL (0.083 %) nebulizer solution 2.5 mg, 2.5 mg, Nebulization, Q4H PRN, Eloise Levels, MD  ??? aluminum-magnesium hydroxide-simethicone (MAALOX MAX) 80-80-8 mg/mL oral suspension, 30 mL, Oral, Q4H PRN, Eloise Levels, MD  ???  amLODIPine (NORVASC) tablet 10 mg, 10 mg, Oral, Daily, Eloise Levels, MD, 10 mg at 10/27/18 0913  ???  atenoloL (TENORMIN) tablet 100 mg, 100 mg, Oral, Daily, Eloise Levels, MD, 100 mg at 10/27/18 0913  ???  B complex vitamins (BALANCE B-50) tablet 1 tablet, 1 tablet, Oral, Daily, Eloise Levels, MD, 1 tablet at 10/27/18 0914  ???  calcium acetate(phosphat bind) (PHOSLO) capsule 667 mg, 667 mg, Oral, 3xd Meals, Verl Bangs, MD, 667 mg at 10/27/18 1756  ???  calcium carbonate (TUMS) chewable tablet 400 mg elem calcium, 400 mg elem calcium, Oral, BID PRN, Eloise Levels, MD  ???  dolutegravir (TIVICAY) TABLET Tab 50 mg, 50 mg, Oral, Daily, Eloise Levels, MD, 50 mg at 10/27/18 2044  ???  emtricitabine-tenofovir alafen (Descovy) 200-25 mg tablet 1 tablet, 1 tablet, Oral, Daily, Eloise Levels, MD, 1 tablet at 10/27/18 2044  ???  febuxostat (ULORIC) tablet 40 mg, 40 mg, Oral, Daily, Eloise Levels, MD, 40 mg at 10/27/18 1204  ???  ferrous sulfate tablet 325 mg, 325 mg, Oral, 3xd Meals, Verl Bangs, MD, 325 mg at 10/27/18 1756  ???  guaiFENesin (ROBITUSSIN) oral syrup, 200 mg, Oral, Q4H PRN, Eloise Levels, MD  ???  hydrALAZINE (  APRESOLINE) tablet 100 mg, 100 mg, Oral, TID, Verl Bangs, MD, 100 mg at 10/27/18 2044  ???  influenza vaccine quad (FLUARIX, FLULAVAL, FLUZONE) (6 MOS & UP) 2020-21, 0.5 mL, Intramuscular, During hospitalization, Verl Bangs, MD  ???  melatonin tablet 6 mg, 6 mg, Oral, Nightly PRN, Eloise Levels, MD  ???  methocarbamoL (ROBAXIN) tablet 1,000 mg, 1,000 mg, Oral, BID PRN, Eloise Levels, MD  ???  pneumococcal polysacchride (23-valps) (PNEUMOVAX) vaccine 0.5 mL, 0.5 mL, Subcutaneous, During hospitalization, Verl Bangs, MD  ???  polyethylene glycol (MIRALAX) packet 17 g, 17 g, Oral, Daily PRN, Eloise Levels, MD  ???  senna (SENOKOT) tablet 2 tablet, 2 tablet, Oral, Nightly PRN, Eloise Levels, MD  ???  valACYclovir (VALTREX) tablet 500 mg, 500 mg, Oral, Daily, Eloise Levels, MD, 500 mg at 10/27/18 0913    Time spent on counseling/coordination of care: 30 Minutes  Total time spent with patient: none

## 2018-10-31 ENCOUNTER — Encounter: Admit: 2018-10-31 | Discharge: 2018-11-01 | Payer: MEDICARE

## 2018-10-31 ENCOUNTER — Telehealth: Admit: 2018-10-31 | Discharge: 2018-11-01 | Payer: MEDICARE

## 2018-10-31 ENCOUNTER — Encounter: Admit: 2018-10-31 | Discharge: 2018-11-01 | Payer: MEDICARE | Attending: Nephrology | Primary: Nephrology

## 2018-10-31 DIAGNOSIS — N186 End stage renal disease: Secondary | ICD-10-CM

## 2018-10-31 DIAGNOSIS — Z01818 Encounter for other preprocedural examination: Secondary | ICD-10-CM

## 2018-10-31 NOTE — Unmapped (Addendum)
SOCIAL WORK PROGRESS NOTE     RE: LATE ARRIVAL    Noted that patient had appointment scheduled today at 1:30.   Patient reported that he has unaware of the orientation class this morning and had not been notified. Due to a scheduling error, patient was incorrectly scheduled for orientation class when he had already attended orientation (unbeknownst to this provider). This SW did not see patient as she thought that patient had not attended orientation.     At this time, this CSW will be unable to see patient today and contacted him to reschedule. Patient's new appointment on on 11/07/2018.      TNC/Megan Zink notified of the above.    Robina Ade  Transplant Case Manager  [facility]

## 2018-10-31 NOTE — Unmapped (Signed)
Transplant Nephrology Virtual Clinic Visit       Referring Physician   Griffin Dakin, MD  [address]  [id number]; [address]  [place],  [place] [address]       History of Present Illness     The patient is a 58 year old Duane [name] who presents for evaluation for potential kidney transplant. He reports he feels well today and denies active symptoms.     He missed the kidney transplant education class this morning, he tried calling megan to reschedule.     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   On dialysis?: yes, on hemodialysis since Dec 2019 and prior to that he was on PD since 12/28/16. The patient was switched due to inadequate PD and volume overload.  He dialyzes at [facility] in [place]. [facility] hospital AMA without dialysis after presenting with volume overload on 9/Duane/20.  Prior transplants:patient denies   Prior blood product transfusions: patient denies   Major Infections: HIV, diagnosed about 10 years ago. He follows with an ID physician at [facility] (Dr. Regina Eck, abs CD4 count 451 on 10/27/18) otherwise patient denies (including other chronic infections such as tuberculosis and hepatitis)   Kidney stones:patient denies   Frequent UTI: patient denies   Urine output: about 1 cup per day  History of Heart disease: (+) chronic diastolic heart failure, otherwise patient denies. He was admitted for observation of chest pain in May 2020 (troponin I negative x2 and CT PE negative).   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 Smoking status: Never Smoker   ??? Smokeless tobacco: Never Used   Substance Use Topics   ??? Alcohol use: Not Currently   ??? Drug use: Never         Past Medical History   Past Medical History:   Diagnosis Date   ??? Anemia    ??? End stage kidney disease (CMS-HCC)    ??? Gout    ??? HIV (human immunodeficiency virus infection) (CMS-HCC)    ??? Hypertension    ??? Renal disorder          Past Surgical History   Past Surgical History:   Procedure Laterality Date   ??? CHG US GUIDE, VASCULAR ACCESS N/A 03/05/2018    Procedure: ULTRASOUND GUIDANCE FOR VASC ACCESS REQUIRING [place] EVAL OF POTENTIAL ACCESS SITES;  Surgeon:  Leona Carry, MD;  Location: MAIN OR [facility];  Service: Transplant   ??? PR CREAT AV FISTULA,NON-AUTOGENOUS GRAFT Left 03/05/2018    Procedure: CREATE AV FISTULA (SEPARATE PROC); NONAUTOGENOUS GRAFT (EG, BIOLOGICAL COLLAGEN, THERMOPLASTIC GRAFT);  Surgeon: Leona Carry, MD;  [facility];  Service: Transplant   ??? PR LAP REVISE INTRAPERITONEAL CATHETER N/A 4/Duane/2019    Procedure: Laparoscopy, Surgical; W/Revis Prev Placed Intraperitoneal Cannula/Cath, Remov Intralumin Obstruct Material;  Surgeon: Leona Carry, MD;  Location: MAIN OR [facility];  Service: Transplant   ??? PR REMOVE PERITONEAL FOREIGN BODY Left 03/05/2018    Procedure: Removal Of Peritoneal Of Foreign Body From Peritoneal Cavity; Surgeon: Leona Carry, MD;  Location: MAIN OR [facility];  Service: Transplant           Allergies   Allergies   Allergen Reactions   ??? Quinapril Swelling     Face swells           Medications   Current Outpatient Medications   Medication Sig Dispense Refill   ??? amLODIPine (NORVASC) 10 MG tablet Take 1 tablet (10 mg total) by mouth daily. 90 tablet 3   ??? atenoloL (TENORMIN) 100 MG tablet Take 100 mg by mouth daily.     ??? dolutegravir (TIVICAY) 50 mg Tab TABLET Take 50 mg by mouth daily.     ??? emtricitabine-tenofovir alafen (DESCOVY) 200-25 mg tablet Take 1 tablet by mouth daily.     ??? febuxostat (ULORIC) 40 mg tablet Take 40 mg by mouth daily.     ??? ferric citrate (AURYXIA) 210 mg iron Tab tablet Take 420 mg by mouth Three (3) times a day with a meal.     ??? ferrous sulfate 325 (65 FE) MG tablet Take 325 mg by mouth daily.     ??? furosemide (LASIX) 80 MG tablet Take 80 mg by mouth daily.     ??? hydrALAZINE (APRESOLINE) 100 MG tablet Take 1 tablet (100 mg total) by mouth Three (3) times a day. 90 tablet 0   ??? methocarbamoL (ROBAXIN) 500 MG tablet Take 1,000 mg by mouth two (2) times a day as needed (muscle pain).     ??? valACYclovir (VALTREX) 500 MG tablet Take 500 mg by mouth daily.     ??? vit B cmplx-FA-vit C-biotin, renal, (RENA-VITE RX) 1-60-300 mg-mg-mcg Tab Take 1 tablet by mouth daily. 30 tablet 4     No current facility-administered medications for this visit.          Family History   Family History   Problem Relation Age of Onset   ??? Bradycardia Maternal Grandmother    ??? Melanoma Neg Hx    ??? Squamous cell carcinoma Neg Hx    ??? Basal cell carcinoma Neg Hx      Denies family history of kidney disease        Review of Systems     no chest pain, no shortness of breath, no nausea, no vomiting, no abdominal pain, no dyspnea with exertion, no light-headedness, no loss of consciousness, no difficulty swallowing, no fevers, no chills, no diarrhea, no blood in stool, no pain urinating, no blood in urine, no sensation of incomplete bladder emptying.     Otherwise as per HPI. All other systems are reviewed and are negative.      Physical Exam     He checked BP this morning: 151/91    General: no acute distress   HEENT: mucous membranes moist  Dentition: Fair  Neck:  neck supple  Lungs: normal respiratory effort  Extremities: no edema  Musculoskeletal: no visible deformity, normal range of motion.   Neurologic: awake, alert, and interactive, no gross neurological deficit          Laboratory Results and Imaging Data Reviewed in EPIC      We reviewed renal transplantation concepts with the patient in detail, including:   1) Kidney donor types including living donors, deceased donors, and increased infectious risk donors  2) Increase risk of diabetes post transplant.   3) Increase risk of infections post surgery.   4) Increase risk of cancers.   5) Increase risk of heart attack especially during the first 3 months after surgery.   6) The possibility of needing dialysis post transplant.   7) Need for a kidney biopsy post transplant and treatment with medications as required.   8) Importance of being compliant with the medications and follow up appointment with physicians and other members of the transplant team.   9) Side effects of immunosuppression.   10) Recurrent disease.         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 Clinical concerns include HIV disease       Recommendations     -Infectious Disease evaluation to define current status of HIV disease and their input on transplantation.  -Perioperative cardiovascular risk assessment including echocardiogram, ECG, and stress testing.   -CT Abd/Pelvis to assess for vascular disease burden in iliac circulation.  -obtain records from last screening colonoscopy.  -Transplant Social Work evaluation _________________________________  I have identified myself to the patient and conveyed my credentials.  In case we get disconnected, patient's phone number is [phone] (home)    Is there someone else in the room? No.   Patient location: [place], [place]    I spent 30 minutes on the real-time audio and video with the patient. I spent an additional 15 minutes on pre- and post-visit activities.     The patient was physically located in [place] or a state in which I am permitted to provide care. The patient and/or parent/guardian understood that s/he may incur co-pays and cost sharing, and agreed to the telemedicine visit. The visit was reasonable and appropriate under the circumstances given the patient's presentation at the time.    The patient and/or parent/guardian has been advised of the potential risks and limitations of this mode of treatment (including, but not limited to, the absence of in-person examination) and has agreed to be treated using telemedicine. The patient's/patient's family's questions regarding telemedicine have been answered.     If the visit was completed in an ambulatory setting, the patient and/or parent/guardian has also been advised to contact their provider???s office for worsening conditions, and seek emergency medical treatment and/or call 911 if the patient deems either necessary.            Clelia Croft, DO  Transplant Nephrologist  Division of Nephrology and Hypertension  [facility]  10/31/2018  2:44 PM

## 2018-11-03 ENCOUNTER — Other Ambulatory Visit: Payer: Self-pay | Admitting: Family Medicine

## 2018-11-04 NOTE — Unmapped (Signed)
2020 Phone Consultation    TFC had a phone consultation on 10/31/2018, with patient regarding current transplant insurance plan benefits. The patient confirmed his date of birth and consented to performing the [facility] consultation via phone. The patient currently has Med[employer] primary.  During the consult, the patient advised that he also has Medicare and Medicaid.  Patient has [employer]  for pharmacy benefits and it is current and active. I discussed the Financial Agreement with the patient and he acknowledged that he understood and had no questions at this time. I explained if he had questions in the future to contact Kim at the phone number provided. After the consult, TFC contacted the Medicare COB line & s/w Lelon Mast D-ID# [id number] who confirmed COB dates: 12/14/2016-06/13/2019. Patient had part B in the past that was dropped but has been reinstated. TFC updated flowsheet & patient insurance letter and sent to patient.

## 2018-11-04 NOTE — Unmapped (Signed)
TFC sent updated insurance letter after re-confirming additional insurance benefits & eligibility and as a result of Phone Consult on 10/31/18.

## 2018-11-15 ENCOUNTER — Other Ambulatory Visit: Payer: Self-pay

## 2018-11-15 DIAGNOSIS — Z20822 Contact with and (suspected) exposure to covid-19: Secondary | ICD-10-CM

## 2018-11-15 NOTE — Unmapped (Addendum)
I spent 65 minutes on the real-time audio and video with the patient. I spent an additional 20 minutes on pre- and post-visit activities (Chart Review).     The patient was physically located in [place] or a state in which I am permitted to provide care. The patient and/or parent/guardian understood that s/he may incur co-pays and cost sharing, and agreed to the telemedicine visit. The visit was reasonable and appropriate under the circumstances given the patient's presentation at the time.    The patient and/or parent/guardian has been advised of the potential risks and limitations of this mode of treatment (including, but not limited to, the absence of in-person examination) and has agreed to be treated using telemedicine. The patient's/patient's family's questions regarding telemedicine have been answered.     If the visit was completed in an ambulatory setting, the patient and/or parent/guardian has also been advised to contact their provider???s office for worsening conditions, and seek emergency medical treatment and/or call 911 if the patient deems either necessary.            PATIENT NAME: Duane [name]   MR#: [id number]    DOB: 11/03/1960      [facility]  CONFIDENTIAL SOCIAL WORK  KIDNEY TRANSPLANT ASSESSMENT     **THIS PATIENT WAS NOT SEEN IN PERSON TO MINIMIZE POTENTIAL SPREAD OF COVID-19, PROTECT PATIENTS/PROVIDERS, AND REDUCE PPE UTILIZATION.**            DATE OF EVALUATION: 11/07/2018    INFORMANTS: Patient/Duane [name], mother/Duane [name]    PREFERRED LANGUAGE: English     TRANSPLANT PHASE:  Evaluation   TRANSPLANT STATUS:  Active since     The limits of confidentiality and the purpose of the evaluation were reviewed. The patient was provided with a verbal description of the nature and purpose of the social work evaluation. I also reviewed the referral source, specific referral question for this evaluation, foreseeable risks/discomforts, benefits, limits of confidentiality, and mandatory reporting requirements of this provider. The patient was given the opportunity to ask questions and receive answers about the present evaluation. Oral consent was provided by the patient.     PRESENTING MEDICAL PROBLEMS AND RELEVANT HISTORY:   Duane [name] is a 58 y.o. African American male who  has a past medical history of Anemia, End stage kidney disease (CMS-HCC), Gout, HIV (human immunodeficiency virus infection) (CMS-HCC), Hypertension, and Renal disorder. Left [facility] hospital AMA without dialysis after presenting with volume overload on 10/26/18.    FUNCTIONAL STATUS:   Duane [name] presents as independent with all personal ADLs, including bathing, dressing, cooking, and household chores.      DME: No  Activity level/Functional Status: high ; patient endorsed that he works on [employer] and also works full-time.    UNDERSTANDING OF MEDICAL CONDITION AND TRANSPLANT:   Duane [name]  attended the virtual Transplant Orientation on 10/24/2018.    Patient verbalizes understanding that hypertension caused his kidney disease.     Pt understands medical illness process: Moderate  Pt understands transplant process/psychosocial risks: Moderate  Education provided: orientation class information regarding transplant process and guidelines were reviewed, including psychosocial risks.    SUPPORT PLAN FOR TRANSPLANTATION:     Plan details: Patient to recover at home under the care of mother/Duane [name]    Primary Support/ Relationship to patient: Duane [name]- [phone]  Age/DOB: 1946  Understanding of medical directions: good  Employment: works at [employer] but is able to take time off with [employer]  Support limitations: employment  Support strengths: historical caregiver, FMLA eligible, accrued paid leave, health literate, valid driver's license, access to reliable vehicle , comfortable driving to [facility] and no other CG responsibilities    Back-up Support/ Relationship to patient: Duane [name] (not present today)- she will follow-up with this provider by phone.   Age/DOB: unknown  Understanding of medical directions:unknown  Employment: unknown  Support limitations: unable to assess / not present  Support strengths: unable to assess / caregiver not present    Advance Directive: Education and form were provided.  <no information>    DIALYSIS HISTORY:   Dialysis History      Start End Type Comments Center    01/14/2018  [facility]    10/12/2016  Peritoneal  [facility]          Current Dialysis Center Information     [facility]     Phone: [phone] Fax: [phone]    Address:  [address]  [place] [address]                        Patient noted that he runs for three hours on a MWF schedule, but has been directed to run for four hours on a M/W/F 2nd shift schedule.Patient states that he considers himself to been compliant with all dialysis recommendations (contradicting his statement about cutting off an hour early three times per week) and provided verbal permission for this CSW to contact his dialysis center.      Transportation:   Self:  Dialysis: drives self   Distance from Home to [facility]: [address]  [place] [address]    ATTITUDE ABOUT TRANSPLANT:   Expectations: Patient reported that he would like a transplant to not have to go to clinic and do dialysis anymore.   Fears/Concerns:     SOCIAL HISTORY:   Citizenship Status: [place] Citizen   Personal History: Patient was born and raised in [place], [place]  Marital Status: divorced  Lives with: Microbiologist  Children/Dependents:  none  Social support: Yes, from mother and aunt  Housing: good Psychologist, forensic, utilities function, appliances function and Heat/AC function    COMPLIANCE HISTORY:   Problems obtaining medications or attending appointments (including transportation):  admits to it not being easy to pay for certain medications, including one that he needed for a rash. Patient noted that he has never not taken a medication due to cost  Problems organizing medications: denies, but noted that he would benefit from a pill box and would like one; this SW encouraged patient to inquire at his pharmacy.      Adherence to treatment/medications: Patient's dialysis center noted that patient left [facility] and missed a critical echocardiogram. He had been admitted with volume overload on 10/26/18.     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   Current income meets basic needs: not really    STRESSORS/COPING STYLE:  Not really a part from finances  Religious/Spiritual Beliefs: I am a [place]    PSYCHIATRIC HISTORY:   Current issues/mood: denies  Past issues: denies  Medications: denies  Therapy: denies  SI/HI: denies  Hospitalizations: denies  Loss/Trauma/Violence: denies  Mania: denies  Psychosis: denies  Hx of adverse reactions to treatment/medication/steroids: denies  PHQ-2 Score: 0     GAD-7 Score:0   They agreed to discuss any changes in the patient's mood or behavior with the medical team.    MENTAL STATUS:   Affect: normal, mood congruent  Appearance: well groomed hair, well dressed, season and temperature appropriate  Attention Span: normal attention span  Attitude: friendly, cooperative  Behavior: calm, cooperative, appropriate eye contact, no psychomotor agitation/retardation noted  Insight & Judgement: intact/appropriate, reliable insight  Level of Consciousness: alert  Mood: euthymic/normal/stable  Orientation: person, place, time, date  Speech: normal speech  Thought Content: logical connections    COGNITIVE HISTORY & HEALTH LITERACY:   Duane [name] does  seem cognitively intact. He denies history of memory or cognitive concerns related to his health. This SW was unable to fully assess  health literacy.     Do you need to have someone help you when you read instructions, pamphlets, or other written material from your doctor or pharmacy? No. How often? Never    Hx of special education or learning challenges: no history of educational challenges    SUBSTANCE HISTORY:   Tobacco: Duane [name]  reports that he has never smoked. He has never used smokeless tobacco.  Alcohol: Duane [name]  Denies any present or past alcohol use  Illicit Substances: Duane [name]  reports no history of drug use other than marijuana in college. Patient's DPS history contradicts this statement.     CHRONIC PAIN HISTORY (and referral needs): none    LEGAL HISTORY:   Duane [name] denies current or past history of legal issues.    A review of  DPS Offender Public Information website revealed [id number] POSSESS SCHEDULE VI (PRINCIPAL)01/07/2000FELONCLASS I.    COLLATERAL CONTACT:  Duane [name] was interviewed alone and this SW followed up with mother regarding caregiving duties.     Called [facility] [place] on 11/19/2018 and spoke with clinic manager and RN/Celeste Oak [place]. Dialysis staff reports concerns about adherence to complex regimen, consistent attendance to dialysis, relationships with staff, lifestyle choices and response to changing medical directions. Prior to his hospitalization for fluid overload in 10/2018, patient's RN noted that he had missed 90% of his dialysis treatments. Currently, patient is noted to have much improved, but cuts his treatments short every time by an hour AMA. RN described his relationship with staff as a constant battle and patient is adamant that he only needs three hours of treatments without offering any reason for only needing three hours. Since his hospitalization, he has only missed attending his treatments twice. Patient's labs are noted to be within goal, with the exception of his fluid gains, phosphorus, and PTH, which are noted to be on the higher side.       EDUCATION:  KIDNEY Transplant Process  Psychosocial risks of transplant, including depression, anxiety, guilt, PTSD  CKD/ESRD can often cause stress on the entire family unit, not just the patient  Advance Care Planning  Long-term financial and vocational planning  Expectations  for support planning both pre- and post-transplant  Transplant social worker availability throughout transplant process  Engineer, manufacturing systems Forms  Kidney Transplant Patients and their Support Persons  Kidney Allocation System (UNOS), Medicare Supplemental plans, Conservator, museum/gallery brochure (UNOS), [facility] Medication Assistance Programs and Working while Disabled    ASSESSMENT AND RECOMMENDATIONS:  Duane [name] is a 58 y.o. male who  has a past medical history of Anemia, End stage kidney disease (CMS-HCC), Gout, HIV (human immunodeficiency virus infection) (CMS-HCC), Hypertension, and Renal disorder. Left [place] hospital AMA without dialysis after presenting with volume overload on 10/26/18.      Patient has a number of psychosocial strengths, including motivation for transplant, absence of psychopathology, denial of substance abuse concerns and appropriate caregiving plan . Patient works full-time and appears to be motivated for transplant. Patient's mother presents as a committed caregiver.     There are several serious psychosocial concerns. Patient left [facility] hospital AMA without dialysis and a needed echocardiogram after presenting with volume overload on 10/26/18. Moreover, patient reported cutting his dialysis sessions short for one hour three times per week and consistently leaving his treatments early AMA. Patient also endorsed that his finances are tenuous and noted that affording medications is a challenge.        SIPAT SCORE: 38- Poor candidate- reflects poor medical adherence. This score may change if adherence improves.    Final decision regarding listing status is based upon committee review at selection meeting.    Recommendations and Plan:   1) Patient to significantly improve dialysis adherence.   2) Patient to improve adherence with medical recommendations.   3) Patient to see psychology given poor dialysis adherence.    4) Patient to consider fundraising to prepare for future medical expenses.  5) Patient to consider exploring [place] Medication Assistance Program and Pharmacy Assistance programs. Information was provided for patient.   6) SW to discuss patient's eligibility for SSDI at next appointment.   7) Patient to consider AD/HCPOA.   8) Patient to discuss back-up caregiving with Duane [name].       CSW to provide continued support and encouragement.    Justice Britain, LCSWA  Transplant Case Manager/Social Worker  [facility]

## 2018-11-16 LAB — NOVEL CORONAVIRUS, NAA: SARS-CoV-2, NAA: NOT DETECTED

## 2018-11-19 DIAGNOSIS — N186 End stage renal disease: Secondary | ICD-10-CM

## 2018-11-19 DIAGNOSIS — Z01818 Encounter for other preprocedural examination: Secondary | ICD-10-CM

## 2018-11-19 DIAGNOSIS — R93429 Abnormal radiologic findings on diagnostic imaging of unspecified kidney: Secondary | ICD-10-CM

## 2018-11-20 ENCOUNTER — Telehealth: Payer: Self-pay | Admitting: Family Medicine

## 2018-11-20 NOTE — Telephone Encounter (Signed)
Left message informing patient we faxed over Negative COVID test to his employer per his request.

## 2018-11-20 NOTE — Telephone Encounter (Signed)
Patient called and was informed of negative test results. Patient would like his negative test results to be faxed over to his employer at [phone]. Please advise.

## 2018-11-26 NOTE — Unmapped (Signed)
This TFC received a v/m from [employer], Public house manager, requesting a status update on member    TFC returned call and LVM informing that patient has started evaluation as of 10/31/2018

## 2018-12-03 ENCOUNTER — Ambulatory Visit
Admission: RE | Admit: 2018-12-03 | Discharge: 2018-12-03 | Disposition: A | Discharge status: Home / Self Care | Payer: PRIVATE HEALTH INSURANCE | Source: Ambulatory Visit | Attending: Nephrology | Admitting: Nephrology

## 2018-12-03 ENCOUNTER — Other Ambulatory Visit: Payer: Self-pay | Admitting: Nephrology

## 2018-12-03 ENCOUNTER — Other Ambulatory Visit: Payer: Self-pay

## 2018-12-03 DIAGNOSIS — R0602 Shortness of breath: Secondary | ICD-10-CM

## 2018-12-11 ENCOUNTER — Other Ambulatory Visit: Payer: Self-pay | Admitting: Family Medicine

## 2018-12-11 ENCOUNTER — Telehealth: Payer: Self-pay | Admitting: Emergency Medicine

## 2018-12-11 DIAGNOSIS — I10 Essential (primary) hypertension: Secondary | ICD-10-CM

## 2018-12-11 DIAGNOSIS — R0602 Shortness of breath: Secondary | ICD-10-CM

## 2018-12-11 NOTE — Telephone Encounter (Signed)
Patient would like a Cardiology referral

## 2018-12-11 NOTE — Telephone Encounter (Signed)
Copied from [place] [id number]. Topic: Referral - Request for Referral >> Dec 11, 2018  8:53 AM Louis [name] wrote: Has patient seen PCP for this complaint? no *If NO, is insurance requiring patient see PCP for this issue before PCP can refer them? Referral for which specialty: heart doctor Preferred provider/office: no preference Reason for referral: Kidney doctor requested that he call PCP and get a referral so that he can have his heart beat checked though a heart doctor.  Pt can be reached at [phone]

## 2018-12-12 ENCOUNTER — Telehealth: Payer: Self-pay | Admitting: Family Medicine

## 2018-12-12 DIAGNOSIS — M109 Gout, unspecified: Secondary | ICD-10-CM

## 2018-12-12 NOTE — Telephone Encounter (Signed)
Medication Refill - Medication: predniSONE (DELTASONE) 10 MG tablet   Has the patient contacted their pharmacy? Yes.   (Agent: If no, request that the patient contact the pharmacy for the refill.) (Agent: If yes, when and what did the pharmacy advise?)  Preferred Pharmacy (with phone number or street name):  [id number] - [place], [place] - [address]  [address] [place] [address]  Phone: [phone] Fax: [phone]     Agent: Please be advised that RX refills may take up to 3 business days. We ask that you follow-up with your pharmacy.  

## 2018-12-16 ENCOUNTER — Other Ambulatory Visit: Payer: Self-pay

## 2018-12-16 ENCOUNTER — Encounter: Payer: Self-pay | Admitting: Cardiology

## 2018-12-16 ENCOUNTER — Ambulatory Visit (INDEPENDENT_AMBULATORY_CARE_PROVIDER_SITE_OTHER): Payer: PRIVATE HEALTH INSURANCE | Admitting: Cardiology

## 2018-12-16 VITALS — BP 176/96 | HR 64 | Temp 96.4°F | Ht 74.0 in | Wt 186.5 lb

## 2018-12-16 DIAGNOSIS — R0602 Shortness of breath: Secondary | ICD-10-CM | POA: Diagnosis not present

## 2018-12-16 DIAGNOSIS — I10 Essential (primary) hypertension: Secondary | ICD-10-CM | POA: Diagnosis not present

## 2018-12-16 MED ORDER — LOSARTAN POTASSIUM 25 MG PO TABS: 25.0000 mg | ORAL_TABLET | Freq: Every day | ORAL | 5 refills | Status: DC

## 2018-12-16 NOTE — Progress Notes (Signed)
Cardiology Office Note:    Date:  12/16/2018   ID:  Louis [name], DOB January 15, 1961, MRN [id number]  PCP:  Steele Sizer, MD  Cardiologist:  Kate Sable, MD  Electrophysiologist:  None   Referring MD: Steele Sizer, MD   Chief Complaint  Patient presents with   other    Ref by Dr. Ancil Boozer for HTN & shortness of breath. Pt. c/o LE edema, shortness of breath with over exertion, chest discomfort and pain in shoulder blades.     History of Present Illness:    Louis [name] is a 58 y.o. male with a hx of hypertension, end-stage renal disease on hemodialysis who presents due to shortness of breath and evaluation of his hypertension.  Patient was diagnosed with a pneumonia 4 months ago.  He states since then, he has been more short of breath especially with activity and feels as if he has not fully recovered from the pneumonia.  He denies chest pain, palpitations.  He is still able to make urine and does not feel fluid overloaded.  He also has a long history of hypertension, with difficulty controlling blood pressures over the past couple of months.  He takes Lasix, Norvasc, atenolol, hydralazine 100 mg 3 times daily and clonidine patch every week was recently added.  He denies diaphoresis, palpitations.  Past Medical History:  Diagnosis Date   Chronic kidney disease    esrd   Dermatophytosis of foot 06/23/2004   Environmental allergies    Gout    HIV infection ([place]) 12/1995   Hypertension    Seborrhea 06/23/2004    Past Surgical History:  Procedure Laterality Date   CAPD INSERTION N/A 12/28/2016   Procedure: LAPAROSCOPIC INSERTION CONTINUOUS AMBULATORY PERITONEAL DIALYSIS  (CAPD) CATHETER;  Surgeon: Algernon Huxley, MD;  Location: [facility];  Service: Vascular;  Laterality: N/A;   DIALYSIS/PERMA CATHETER INSERTION N/A 10/09/2016   Procedure: DIALYSIS/PERMA CATHETER INSERTION;  Surgeon: Algernon Huxley, MD;  Location: [facility];  Service: Cardiovascular;   Laterality: N/A;   DIALYSIS/PERMA CATHETER REMOVAL N/A 02/19/2017   Procedure: DIALYSIS/PERMA CATHETER REMOVAL;  Surgeon: Algernon Huxley, MD;  Location: [facility];  Service: Cardiovascular;  Laterality: N/A;   HERNIA REPAIR Bilateral    inguinal    Current Medications: Current Meds  Medication Sig   amLODipine (NORVASC) 10 MG tablet Take 1 tablet (10 mg total) by mouth daily.   ANORO ELLIPTA 62.5-25 MCG/INH AEPB TAKE 1 PUFF BY MOUTH EVERY DAY   Apremilast (OTEZLA) 10 & 20 & 30 MG TBPK Take 1 (10mg ) tablet by mouth in the morning for 3 days, then take 1 (20mg ) tablet in the morning for 2 days, then take 1 (30mg ) tablet in the morning therafter. **DO NOT FOLLOW STARTER PACK INSTRUCTIONS - NO PM DOSES**   atenolol (TENORMIN) 100 MG tablet Take 1 tablet by mouth daily.   AURYXIA 1 GM 210 MG(Fe) tablet Take 1 tablet by mouth 4 (four) times daily -  before meals and at bedtime.   B Complex-C-Folic Acid (RENA-VITE RX) 1 MG TABS Take 1 tablet by mouth daily.   cetirizine (ZYRTEC) 10 MG tablet Take 1 tablet (10 mg total) by mouth daily.   cloNIDine (CATAPRES - DOSED IN MG/24 HR) 0.1 mg/24hr patch Place 0.1 mg onto the skin once a week.    DESCOVY 200-25 MG tablet Take 1 tablet by mouth daily.   diclofenac sodium (VOLTAREN) 1 % GEL Apply 2 g topically 4 (four) times daily.  Febuxostat 80 MG TABS Take 1 tablet by mouth daily.   ferrous sulfate 325 (65 FE) MG tablet Take 1 tablet by mouth daily.   furosemide (LASIX) 40 MG tablet Take 80 mg by mouth daily.    halobetasol (ULTRAVATE) 0.05 % cream APPLY TO AFFECTED AREA TWICE A DAY   hydrALAZINE (APRESOLINE) 100 MG tablet Take 100 mg by mouth 3 (three) times daily.   methocarbamol (ROBAXIN) 500 MG tablet Take 500 mg by mouth 2 (two) times daily as needed. for pain   predniSONE (DELTASONE) 10 MG tablet Take 1 tablet (10 mg total) by mouth 2 (two) times daily as needed. Prn gout attack for 2 days   TIVICAY 50 MG tablet Take 1  tablet by mouth daily.   valACYclovir (VALTREX) 500 MG tablet Take 500 mg by mouth daily as needed (for flare ups).      Allergies:   Quinapril hcl   Social History   Socioeconomic History   Marital status: Single    Spouse name: Not on file   Number of children: 0   Years of education: Not on file   Highest education level: Associate degree: occupational, Hotel manager, or vocational program  Occupational History   Not on file  Social Needs   Financial resource strain: Not very hard   Food insecurity    Worry: Never true    Inability: Never true   Transportation needs    Medical: No    Non-medical: No  Tobacco Use   Smoking status: Never Smoker   Smokeless tobacco: Never Used  Substance and Sexual Activity   Alcohol use: Not Currently    Alcohol/week: 2.0 standard drinks    Types: 2 Standard drinks or equivalent per week    Comment: has not had any alcohol in a while   Drug use: No   Sexual activity: Yes    Partners: Female    Birth control/protection: Condom  Lifestyle   Physical activity    Days per week: 0 days    Minutes per session: 0 min   Stress: Only a little  Relationships   Social connections    Talks on phone: More than three times a week    Gets together: More than three times a week    Attends religious service: More than 4 times per year    Active member of club or organization: No    Attends meetings of clubs or organizations: Never    Relationship status: Never married  Other Topics Concern   Not on file  Social History Narrative   Not on file     Family History: The patient's family history includes Bradycardia in his maternal grandmother.  ROS:   Please see the history of present illness.     All other systems reviewed and are negative.  EKGs/Labs/Other Studies Reviewed:    The following studies were reviewed today:   EKG:  EKG is  ordered today.  The ekg ordered today demonstrates normal sinus rhythm, possible left  atrial enlargement, possible LVH per voltage criteria.  Recent Labs: 05/24/2018: ALT 15; B Natriuretic Peptide 1,499.0; BUN 70; Creatinine, Ser 18.08; Hemoglobin 9.4; Platelets 226; Potassium 4.5; Sodium 140  Recent Lipid Panel    Component Value Date/Time   CHOL 179 08/09/2011 1545   TRIG 89 08/09/2011 1545   HDL 54 08/09/2011 1545   CHOLHDL 3.3 08/09/2011 1545   VLDL 18 08/09/2011 1545   LDLCALC 107 (H) 08/09/2011 1545    Physical Exam:  VS:  BP (!) 176/96 (BP Location: Left Arm, Patient Position: Sitting, Cuff Size: Normal)    Pulse 64    Temp (!) 96.4 F (35.8 C)    Ht 6\' 2"  (1.88 m)    Wt 186 lb 8 oz (84.6 kg)    BMI 23.95 kg/m     Wt Readings from Last 3 Encounters:  12/16/18 186 lb 8 oz (84.6 kg)  08/19/18 191 lb 1.6 oz (86.7 kg)  05/24/18 189 lb 9.5 oz (86 kg)     GEN:  Well nourished, well developed in no acute distress HEENT: Normal NECK: No JVD; No carotid bruits LYMPHATICS: No lymphadenopathy CARDIAC: RRR, no murmurs, rubs, gallops, AV fistula bruit auscultated. RESPIRATORY:  Clear to auscultation without rales, wheezing or rhonchi  ABDOMEN: Soft, non-tender, non-distended MUSCULOSKELETAL:  No edema; No deformity  SKIN: Warm and dry NEUROLOGIC:  Alert and oriented x 3 PSYCHIATRIC:  Normal affect   ASSESSMENT:   Patient has difficult to control blood pressure.  We will try to rule out secondary causes.  He does not have findings suspicious of pheochromocytoma with no episodes of flushing or tachycardia.  He does not appear to be fluid overload in light of end-stage renal disease.  His CKD obviously is contributing to hypertension.  Renal artery stenosis is possible.  His serum potassium appears normal which speaks against hyper aldosteronism but potassium could be falsely elevated since he has chronic kidney disease. EKG suggests LVH.  1. Essential hypertension   2. SOB (shortness of breath)    PLAN:    In order of problems listed above:  1. Continue  current blood pressure medications.  Start a trial of losartan 25 mg daily.  If patient tolerates losartan we will plan to increase dose.  He will keep a blood pressure log daily.  We will get renal artery ultrasound to evaluate for possible stenosis.  Depending on the results of the above plan, we will consider adding spironolactone. 2. Get echocardiogram to evaluate shortness of breath.  EKG also suggests LVH.  Total encounter time more than 45 minutes  Greater than 50% was spent in counseling and coordination of care with the patient  This note was generated in part or whole with voice recognition software. Voice recognition is usually quite accurate but there are transcription errors that can and very often do occur. I apologize for any typographical errors that were not detected and corrected.   Medication Adjustments/Labs and Tests Ordered: Current medicines are reviewed at length with the patient today.  Concerns regarding medicines are outlined above.  Orders Placed This Encounter  Procedures   EKG 12-Lead   ECHOCARDIOGRAM COMPLETE   VAS US RENAL ARTERY DUPLEX   Meds ordered this encounter  Medications   losartan (COZAAR) 25 MG tablet    Sig: Take 1 tablet (25 mg total) by mouth daily.    Dispense:  30 tablet    Refill:  5    Patient Instructions  Medication Instructions:  Your physician has recommended you make the following change in your medication:   START Losartan 25 mg daily. An Rx has been sent to your pharmacy.  *If you need a refill on your cardiac medications before your next appointment, please call your pharmacy*  Lab Work: None ordered If you have labs (blood work) drawn today and your tests are completely normal, you will receive your results only by:  [place] (if you have MyChart) OR  A paper copy in the mail If  you have any lab test that is abnormal or we need to change your treatment, we will call you to review the  results.  Testing/Procedures: Your physician has requested that you have an echocardiogram. Echocardiography is a painless test that uses sound waves to create images of your heart. It provides your doctor with information about the size and shape of your heart and how well your hearts chambers and valves are working. This procedure takes approximately one hour. There are no restrictions for this procedure.  Your physician has requested that you have a renal artery duplex. During this test, an ultrasound is used to evaluate blood flow to the kidneys. Allow one hour for this exam. Do not eat after midnight the day before and avoid carbonated beverages. Take your medications as you usually do.    Follow-Up: At [facility], you and your health needs are our priority.  As part of our continuing mission to provide you with exceptional heart care, we have created designated Provider Care Teams.  These Care Teams include your primary Cardiologist (physician) and Advanced Practice Providers (APPs -  Physician Assistants and Nurse Practitioners) who all work together to provide you with the care you need, when you need it.  Your next appointment:   2 months  The format for your next appointment:   In Person  Provider:   Kate Sable, MD  Other Instructions  Please call the office in 1 week to provide an update on how you are doing on the Losartan.     Signed, Kate Sable, MD  12/16/2018 4:26 PM    [place]

## 2018-12-16 NOTE — Patient Instructions (Signed)
Medication Instructions:  Your physician has recommended you make the following change in your medication:   START Losartan 25 mg daily. An Rx has been sent to your pharmacy.  *If you need a refill on your cardiac medications before your next appointment, please call your pharmacy*  Lab Work: None ordered If you have labs (blood work) drawn today and your tests are completely normal, you will receive your results only by: [facility] MyChart Message (if you have MyChart) OR . A paper copy in the mail If you have any lab test that is abnormal or we need to change your treatment, we will call you to review the results.  Testing/Procedures: Your physician has requested that you have an echocardiogram. Echocardiography is a painless test that uses sound waves to create images of your heart. It provides your doctor with information about the size and shape of your heart and how well your heart's chambers and valves are working. This procedure takes approximately one hour. There are no restrictions for this procedure.  Your physician has requested that you have a renal artery duplex. During this test, an ultrasound is used to evaluate blood flow to the kidneys. Allow one hour for this exam. Do not eat after midnight the day before and avoid carbonated beverages. Take your medications as you usually do.    Follow-Up: At [facility], you and your health needs are our priority.  As part of our continuing mission to provide you with exceptional heart care, we have created designated Provider Care Teams.  These Care Teams include your primary Cardiologist (physician) and Advanced Practice Providers (APPs -  Physician Assistants and Nurse Practitioners) who all work together to provide you with the care you need, when you need it.  Your next appointment:   2 months  The format for your next appointment:   In Person  Provider:   Kate Sable, MD  Other Instructions  Please call the office in 1  week to provide an update on how you are doing on the Losartan.

## 2018-12-17 ENCOUNTER — Other Ambulatory Visit: Payer: Self-pay | Admitting: Family Medicine

## 2018-12-17 DIAGNOSIS — M109 Gout, unspecified: Secondary | ICD-10-CM

## 2018-12-17 MED ORDER — PREDNISONE 10 MG PO TABS: 10.0000 mg | ORAL_TABLET | Freq: Two times a day (BID) | ORAL | 0 refills | Status: DC | PRN

## 2018-12-17 NOTE — Telephone Encounter (Signed)
Pt wants to know what it means that refill of this medication was not appropriate.

## 2018-12-18 ENCOUNTER — Telehealth: Payer: Self-pay | Admitting: Cardiology

## 2018-12-18 NOTE — Telephone Encounter (Signed)
Spoke with patient. He saw Dr Garen Lah on 12/16/18.  Started the losartan 25 mg daily in the morning of 11/3. Reviewed BP readings:  11/3   PM 181/110  Took losartan around 9 am.  11/4 0500 am 167/104 prior to meds, took losartan around 5 am as well.    10 am  157/98  Patient is currently at dialysis and will have his BP checked soon. He has echo and renal ultrasound here tomorrow. He will update [place] with the BP readings tomorrow when he comes for his appointment. He said he had only picked up 3 pills from the pharmacy because he wanted to make sure he did not have swelling to this medication. Thus far, patient denies swelling or any other adverse reactions. Advised him to go ahead and fill his prescriptions and continue Losartan. Advised him to continue to monitor blood pressure and heart rate for another few days and then call us at the end of week with an update.  He verbalized understanding and will update [place] tomorrow with readings as well.

## 2018-12-18 NOTE — Telephone Encounter (Signed)
Since taking medication Yesterday afternoon  181/110 Today 5am  167/104 10am  157/98  Would like to know what to do with medication going forward Please call to discuss

## 2018-12-19 ENCOUNTER — Other Ambulatory Visit: Payer: Self-pay

## 2018-12-19 ENCOUNTER — Ambulatory Visit (INDEPENDENT_AMBULATORY_CARE_PROVIDER_SITE_OTHER): Payer: PRIVATE HEALTH INSURANCE

## 2018-12-19 DIAGNOSIS — I10 Essential (primary) hypertension: Secondary | ICD-10-CM

## 2018-12-19 DIAGNOSIS — R0602 Shortness of breath: Secondary | ICD-10-CM | POA: Diagnosis not present

## 2018-12-19 NOTE — Telephone Encounter (Signed)
lvm for scheduling °

## 2018-12-24 ENCOUNTER — Telehealth: Payer: Self-pay

## 2018-12-24 NOTE — Telephone Encounter (Signed)
Kate Sable, MD    Thanks for the update. Please increase losartan to 50mg  daily ( he can take 2tabs of the 25mg ). Patient should continue checking bp and monitoring symptoms. Continue other bp meds as prescribed.

## 2018-12-24 NOTE — Telephone Encounter (Signed)
-----   Message from Kate Sable, MD sent at 12/23/2018  1:43 PM EST ----- Mildly reduced EF. Keep follow up appointment

## 2018-12-24 NOTE — Telephone Encounter (Signed)
Another encounter opened today, 12/24/18, is addressing this issue along with other testing results. Closing this encounter and will follow up in subsequent one.

## 2018-12-24 NOTE — Telephone Encounter (Signed)
Called to give the patient echo and renal artery duplex results. lmtcb.

## 2018-12-25 ENCOUNTER — Other Ambulatory Visit: Payer: Self-pay

## 2018-12-25 MED ORDER — LOSARTAN POTASSIUM 50 MG PO TABS: 50.0000 mg | ORAL_TABLET | Freq: Every day | ORAL | 1 refills | Status: DC

## 2018-12-25 NOTE — Telephone Encounter (Signed)
Patient made aware of echo and renal artery duplex results with verbalized understanding. Patient sts that he is tolerating Losartan 25mg  daily  But his BP continues to run high. Patient's BP this morning 171/112. Patient is asymptomatic and he is currently at work.  He has been checking his BP with in the hour of him taking his BP medications. Patient advised of Dr. Mylo Red- Etang's recommendation to increase Losartan to 50mg  daily. Patient is agreeable. Advised the patient that he can (2) of the 25mg  tablet at once. Rx sen to the patient's pharmacy for Losartan 50mg  daily #90 R-1.  Advised the patient to continue to monitor his BP daily, preferably 2-3 hours after taking his medication. Advised the patient to contact the office in 1 week to provide the readings. Patient verbalized understanding and voiced appreciation for the call.

## 2018-12-26 ENCOUNTER — Other Ambulatory Visit: Payer: PRIVATE HEALTH INSURANCE

## 2018-12-27 ENCOUNTER — Telehealth: Payer: Self-pay | Admitting: Cardiology

## 2018-12-27 MED ORDER — LOSARTAN POTASSIUM 100 MG PO TABS: 100.0000 mg | ORAL_TABLET | Freq: Every day | ORAL | 3 refills | Status: DC

## 2018-12-27 NOTE — Telephone Encounter (Signed)
-----   Message from Kate Sable, MD sent at 12/27/2018 12:47 PM EST -----  Increase losartan to 100mg  qd. Please send new prescription to pharmacy. Continue other bp meds as prescribed.

## 2018-12-27 NOTE — Telephone Encounter (Signed)
I spoke with the patient.  He states that he has been checking his BP at home and it has still been elevated.  11/12- 10 pm- 161/91 11/13- 3 am (woke up w/ a headache)- 171/107 11/13- 8:49 am- 165/102  Readings have been very similar to this prior to yesterday per the patients report.   I confirmed with the patient that he is taking: - amlodipine 10 mg once daily - atenolol 100 mg once daily - clonidine 0.1 mg patch- place once weekly on Tuesdays - furosemide 40 mg- 2 tablets (80 mg) once daily - losartan 25 mg- 2 tablets (50 mg) once daily (increased on 12/18/18)  - hydralazine 100 mg three times day (the patient states ~ 2/ week he will take the hydralazine 5 x/ day as he can feel when his BP is up)  Meds are taken at 4 am daily since he he to be at work at 5 am.   The patient states he will sometimes feel like his "steps aren't right" about 5-10 minutes after taking losartan.  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 He feels he can still do his job ok, he will just get a headache at times. He denies edema/ increased salt intake  I have advised the patient I will forward this message to Dr. Garen Lah to review and we will call back with further MD recommendations.  [facility] is correct.

## 2018-12-27 NOTE — Telephone Encounter (Signed)
I spoke with the patient regarding Dr. Thereasa Solo recommendations.  He is agreeable with increasing losartan to 100 mg once daily.  He is aware I will send in a new RX to the pharmacy for this.

## 2018-12-27 NOTE — Telephone Encounter (Signed)
Pt c/o BP issue: STAT if pt c/o blurred vision, one-sided weakness or slurred speech  1. What are your last 5 BP readings?  11/13- 165/102 11/12- pm 161/91   2. Are you having any other symptoms (ex. Dizziness, headache, blurred vision, passed out)? Headache around 3 am today (171/107)  3. What is your BP issue? elevated

## 2018-12-30 ENCOUNTER — Telehealth: Payer: Self-pay | Admitting: Cardiology

## 2018-12-30 NOTE — Telephone Encounter (Signed)
Attempted to call patient. LMTCB 12/30/2018

## 2018-12-30 NOTE — Telephone Encounter (Signed)
No answer. Left message to call back.   

## 2018-12-30 NOTE — Telephone Encounter (Signed)
Patient calling with updated BP readings  12/29/18 0800 took 100 MG losartan 1200 171/107  12/30/18 0400 took 100 MG losartan 0845 167/104  Patient also has SOB - has had pneumonia before and that it was it feels like now Please call to discuss - patient is at work so leave a message - he will return call when on break

## 2018-12-30 NOTE — Telephone Encounter (Signed)
Patient returning call.

## 2018-12-30 NOTE — Telephone Encounter (Signed)
Patient is returning your call.  

## 2018-12-30 NOTE — Telephone Encounter (Signed)
Called and s/w patient. Denies chest pain, dizziness of lightheadedness. In previous entry BP remaining elevated. Patient states he occasionally has a headache when its almost time for his hydralazine.  He c/o SOB when carrying a package of water outside today. Feels like when he had pneumonia in the past - recommended PCP. However, patient wants to make sure its not his heart. Recently made BP med changes. Scheduled patient to come in to see Dr Garen Lah on Friday, 01/03/19 to discuss. Also, routing to Dr. Garen Lah for further advice.

## 2019-01-01 ENCOUNTER — Telehealth: Payer: Self-pay | Admitting: Cardiology

## 2019-01-01 NOTE — Telephone Encounter (Signed)
Patient is concerned about losing time at work and would prefer a telephone visit with Dr. Mylo Red tomorrow if he is not going to do any procedures.    Please call to discuss with patient .

## 2019-01-01 NOTE — Telephone Encounter (Signed)
Patient has already been advised that Dr. Garen Lah does not offer virtual visits. Message fwd to MD to advise on the patient's 01/02/19 appt.

## 2019-01-01 NOTE — Telephone Encounter (Signed)
BP log updated   Today -   BP 155/95   HR  66

## 2019-01-01 NOTE — Telephone Encounter (Signed)
BP reading fwd to Dr. Garen Lah.

## 2019-01-02 ENCOUNTER — Other Ambulatory Visit: Payer: Self-pay

## 2019-01-02 ENCOUNTER — Ambulatory Visit (INDEPENDENT_AMBULATORY_CARE_PROVIDER_SITE_OTHER): Payer: PRIVATE HEALTH INSURANCE | Admitting: Cardiology

## 2019-01-02 ENCOUNTER — Encounter: Payer: Self-pay | Admitting: Cardiology

## 2019-01-02 VITALS — BP 150/82 | HR 60 | Ht 74.0 in | Wt 183.8 lb

## 2019-01-02 DIAGNOSIS — R0602 Shortness of breath: Secondary | ICD-10-CM

## 2019-01-02 DIAGNOSIS — I5020 Unspecified systolic (congestive) heart failure: Secondary | ICD-10-CM | POA: Diagnosis not present

## 2019-01-02 DIAGNOSIS — I10 Essential (primary) hypertension: Secondary | ICD-10-CM

## 2019-01-02 MED ORDER — CARVEDILOL 25 MG PO TABS: 25.0000 mg | ORAL_TABLET | Freq: Two times a day (BID) | ORAL | 3 refills | Status: DC

## 2019-01-02 NOTE — Telephone Encounter (Signed)
Called to give the patient Dr. Thereasa Solo recommendation. Lmtcb.

## 2019-01-02 NOTE — Progress Notes (Signed)
Cardiology Office Note:    Date:  01/02/2019   ID:  Louis [name], DOB 1960/09/22, MRN [id number]  PCP:  Steele Sizer, MD  Cardiologist:  Kate Sable, MD  Electrophysiologist:  None   Referring MD: Steele Sizer, MD   Chief Complaint  Patient presents with  . other    BP Management c/o SOB when laying in bed, fatigue and back and lung pain after dialysis feels like a cramp when taking a deep breath. Meds reviewed verbally with pt.    History of Present Illness:    Louis [name] is a 58 y.o. male with a hx of hypertension, end-stage renal disease on hemodialysis who presents for follow-up.  He was last seen due to evaluation of his hypertension.    He has a long history of hypertension, with difficulty controlling blood pressures over the past couple of months.  He takes Lasix, Norvasc, atenolol, hydralazine 100 mg 3 times daily and clonidine patch every week was recently added.  Losartan was started after last visit and subsequently titrated to 100 mg daily.  States having some shortness of breath with exertion.  His last echocardiogram did show mildly reduced ejection fraction.  He had a dobutamine stress test at [facility] back in May 2020.  Test was inconclusive due to inadequate heart rates.  Past Medical History:  Diagnosis Date  . Chronic kidney disease    esrd  . Dermatophytosis of foot 06/23/2004  . Environmental allergies   . Gout   . HIV infection ([place]) 12/1995  . Hypertension   . Seborrhea 06/23/2004    Past Surgical History:  Procedure Laterality Date  . CAPD INSERTION N/A 12/28/2016   Procedure: LAPAROSCOPIC INSERTION CONTINUOUS AMBULATORY PERITONEAL DIALYSIS  (CAPD) CATHETER;  Surgeon: Algernon Huxley, MD;  Location: [facility] ORS;  Service: Vascular;  Laterality: N/A;  . DIALYSIS/PERMA CATHETER INSERTION N/A 10/09/2016   Procedure: DIALYSIS/PERMA CATHETER INSERTION;  Surgeon: Algernon Huxley, MD;  Location: [place] [facility];  Service: Cardiovascular;   Laterality: N/A;  . DIALYSIS/PERMA CATHETER REMOVAL N/A 02/19/2017   Procedure: DIALYSIS/PERMA CATHETER REMOVAL;  Surgeon: Algernon Huxley, MD;  Location: [place] [facility];  Service: Cardiovascular;  Laterality: N/A;  . HERNIA REPAIR Bilateral    inguinal    Current Medications: Current Meds  Medication Sig  . amLODipine (NORVASC) 10 MG tablet Take 1 tablet (10 mg total) by mouth daily.  Jearl Klinefelter ELLIPTA 62.5-25 MCG/INH AEPB TAKE 1 PUFF BY MOUTH EVERY DAY  . AURYXIA 1 GM 210 MG(Fe) tablet Take 1 tablet by mouth 4 (four) times daily -  before meals and at bedtime.  . B Complex-C-Folic Acid (RENA-VITE RX) 1 MG TABS Take 1 tablet by mouth daily.  . cetirizine (ZYRTEC) 10 MG tablet Take 1 tablet (10 mg total) by mouth daily.  . cloNIDine (CATAPRES - DOSED IN MG/24 HR) 0.1 mg/24hr patch Place 0.1 mg onto the skin once a week.   . DESCOVY 200-25 MG tablet Take 1 tablet by mouth daily.  . diclofenac sodium (VOLTAREN) 1 % GEL Apply 2 g topically 4 (four) times daily.  . Febuxostat 80 MG TABS Take 1 tablet by mouth daily.  . ferrous sulfate 325 (65 FE) MG tablet Take 1 tablet by mouth daily.  . furosemide (LASIX) 40 MG tablet Take 80 mg by mouth daily.   . halobetasol (ULTRAVATE) 0.05 % cream APPLY TO AFFECTED AREA TWICE A DAY  . hydrALAZINE (APRESOLINE) 100 MG tablet Take 100 mg by mouth  3 (three) times daily.  Marland Kitchen losartan (COZAAR) 100 MG tablet Take 1 tablet (100 mg total) by mouth daily.  . methocarbamol (ROBAXIN) 500 MG tablet Take 500 mg by mouth 2 (two) times daily as needed. for pain  . predniSONE (DELTASONE) 10 MG tablet Take 1 tablet (10 mg total) by mouth 2 (two) times daily as needed. Prn gout attack for 2 days  . TIVICAY 50 MG tablet Take 1 tablet by mouth daily.  . valACYclovir (VALTREX) 500 MG tablet Take 500 mg by mouth daily as needed (for flare ups).   . [DISCONTINUED] atenolol (TENORMIN) 100 MG tablet Take 1 tablet by mouth daily.     Allergies:   Quinapril hcl   Social History    Socioeconomic History  . Marital status: Single    Spouse name: Not on file  . Number of children: 0  . Years of education: Not on file  . Highest education level: Associate degree: occupational, Hotel manager, or vocational program  Occupational History  . Not on file  Social Needs  . Financial resource strain: Not very hard  . Food insecurity    Worry: Never true    Inability: Never true  . Transportation needs    Medical: No    Non-medical: No  Tobacco Use  . Smoking status: Never Smoker  . Smokeless tobacco: Never Used  Substance and Sexual Activity  . Alcohol use: Not Currently    Alcohol/week: 2.0 standard drinks    Types: 2 Standard drinks or equivalent per week    Comment: has not had any alcohol in a while  . Drug use: No  . Sexual activity: Yes    Partners: Female    Birth control/protection: Condom  Lifestyle  . Physical activity    Days per week: 0 days    Minutes per session: 0 min  . Stress: Only a little  Relationships  . Social connections    Talks on phone: More than three times a week    Gets together: More than three times a week    Attends religious service: More than 4 times per year    Active member of club or organization: No    Attends meetings of clubs or organizations: Never    Relationship status: Never married  Other Topics Concern  . Not on file  Social History Narrative  . Not on file     Family History: The patient's family history includes Bradycardia in his maternal grandmother.  ROS:   Please see the history of present illness.     All other systems reviewed and are negative.  EKGs/Labs/Other Studies Reviewed:    The following studies were reviewed today: TTE 01/13/2019 1. Significant LV and RV hypertrophy, concern for regions of noncompaction, consider additional imaging if clinically indicated.  2. Left ventricular ejection fraction, by visual estimation, is 45 to 50%. The left ventricle has mildly decreased function.  There is mildly increased left ventricular hypertrophy in the basal regions, with moderate LVH in the mid to distal regions,  severe LVH in the inferior/lateral apical regions. Unable to exclude regions on noncompaction.  3. Mildly dilated left ventricular internal cavity size.  4. Left ventricular diastolic parameters are consistent with Grade II diastolic dysfunction (pseudonormalization).  5. Global right ventricle has normal systolic function.The right ventricular size is normal. Moderately increased right ventricular wall thickness.  6. Left atrial size was moderately dilated.  7. There is mild dilatation of the aortic root measuring 39 mm.  8.  Severely elevated pulmonary artery systolic pressure.  9. The inferior vena cava is dilated in size with <50% respiratory variability, suggesting right atrial pressure of 20 mmHg. 10. The tricuspid regurgitant velocity is 3.39 m/s, and with an assumed right atrial pressure of 20 mmHg, the estimated right ventricular systolic pressure is severely elevated at 66.0 mmHg.    Summary: Largest Aortic Diameter: 2.1 cm   Renal:   Right: Normal size right kidney. Abnormal right Resistive Index.        Abnormal cortical thickness of right kidney. No evidence of        right renal artery stenosis. RRV flow present. Cyst(s) noted.        Echogenic hyperechoic kidney parenchyma. There is absent        diastolic flow present throughout the segmental Dopplers. Left:  Normal size of left kidney. Abnormal left Resisitve Index.        Abnormal cortical thickness of the left kidney. No evidence        of left renal artery stenosis. LRV flow present. Cyst(s)        noted. Echogenic hyperechoic kidney parenchyma. There is        absent diastolic flow present throughout segmental Dopplers.   EKG:  EKG is  ordered today.  The ekg ordered today demonstrates normal sinus rhythm, possible LVH per voltage criteria.  Recent Labs: 05/24/2018: ALT 15; B Natriuretic  Peptide 1,499.0; BUN 70; Creatinine, Ser 18.08; Hemoglobin 9.4; Platelets 226; Potassium 4.5; Sodium 140  Recent Lipid Panel    Component Value Date/Time   CHOL 179 08/09/2011 1545   TRIG 89 08/09/2011 1545   HDL 54 08/09/2011 1545   CHOLHDL 3.3 08/09/2011 1545   VLDL 18 08/09/2011 1545   LDLCALC 107 (H) 08/09/2011 1545    Physical Exam:    VS:  BP (!) 150/82 (BP Location: Left Arm, Patient Position: Sitting, Cuff Size: Normal)   Pulse 60   Ht 6\' 2"  (1.88 m)   Wt 183 lb 12 oz (83.3 kg)   SpO2 91%   BMI 23.59 kg/m     Wt Readings from Last 3 Encounters:  01/02/19 183 lb 12 oz (83.3 kg)  12/16/18 186 lb 8 oz (84.6 kg)  08/19/18 191 lb 1.6 oz (86.7 kg)     GEN:  Well nourished, well developed in no acute distress HEENT: Normal NECK: No JVD; No carotid bruits LYMPHATICS: No lymphadenopathy CARDIAC: RRR, no murmurs, rubs, gallops, AV fistula bruit auscultated. RESPIRATORY:  Clear to auscultation without rales, wheezing or rhonchi  ABDOMEN: Soft, non-tender, non-distended MUSCULOSKELETAL:  No edema; No deformity  SKIN: Warm and dry NEUROLOGIC:  Alert and oriented x 3 PSYCHIATRIC:  Normal affect   ASSESSMENT:   His blood pressure has improved since starting losartan.  Systolic blood pressure in the 150s now, was 170s prior.  Also having shortness of breath and echo cardiogram shows mildly reduce ejection fraction and grade 2 diastolic dysfunction.  Renal artery ultrasound did not reveal any stenosis.    1. Essential hypertension   2. Shortness of breath   3. Heart failure with reduced ejection fraction (HCC)    PLAN:     1. Get Lexiscan myocardial perfusion imaging stress test. 2. Continue current blood pressure medications.  Start Coreg 25 mg twice daily losartan, Lasix for heart failure stop atenolol.  He will keep a blood pressure log daily.  We will consider starting Aldactone if blood pressures stay elevated. 3. Start Coreg as above.  Continue  Lasix, losartan for  mildly reduced ejection fraction.  Follow-up in 6 weeks  Total encounter time more than 40 minutes  Greater than 50% was spent in counseling and coordination of care with the patient  This note was generated in part or whole with voice recognition software. Voice recognition is usually quite accurate but there are transcription errors that can and very often do occur. I apologize for any typographical errors that were not detected and corrected.   Medication Adjustments/Labs and Tests Ordered: Current medicines are reviewed at length with the patient today.  Concerns regarding medicines are outlined above.  Orders Placed This Encounter  Procedures  . NM Myocar Multi W/Spect W/Wall Motion / EF  . EKG 12-Lead   Meds ordered this encounter  Medications  . carvedilol (COREG) 25 MG tablet    Sig: Take 1 tablet (25 mg total) by mouth 2 (two) times daily.    Dispense:  180 tablet    Refill:  3    Patient Instructions  Medication Instructions:  Your physician has recommended you make the following change in your medication:  1. STOP Atenolol 2. START Carvedilol 25 mg twice a day   *If you need a refill on your cardiac medications before your next appointment, please call your pharmacy*  Lab Work: None If you have labs (blood work) drawn today and your tests are completely normal, you will receive your results only by: Marland Kitchen MyChart Message (if you have MyChart) OR . A paper copy in the mail If you have any lab test that is abnormal or we need to change your treatment, we will call you to review the results.  Testing/Procedures: [place]  Your caregiver has ordered a Stress Test with nuclear imaging. The purpose of this test is to evaluate the blood supply to your heart muscle. This procedure is referred to as a "Non-Invasive Stress Test." This is because other than having an IV started in your vein, nothing is inserted or "invades" your body. Cardiac stress tests are done to find  areas of poor blood flow to the heart by determining the extent of coronary artery disease (CAD). Some patients exercise on a treadmill, which naturally increases the blood flow to your heart, while others who are  unable to walk on a treadmill due to physical limitations have a pharmacologic/chemical stress agent called Lexiscan . This medicine will mimic walking on a treadmill by temporarily increasing your coronary blood flow.   Please note: these test may take anywhere between 2-4 hours to complete  PLEASE REPORT TO [facility] AT THE FIRST DESK WILL DIRECT YOU WHERE TO GO  Date of Procedure:_____________________________________  Arrival Time for Procedure:______________________________  Instructions regarding medication:    _XX___:  Hold Furosemide the morning of your test.  PLEASE NOTIFY THE OFFICE AT LEAST 24 HOURS IN ADVANCE IF YOU ARE UNABLE TO KEEP YOUR APPOINTMENT.  [phone] AND  PLEASE NOTIFY NUCLEAR MEDICINE AT [facility] AT LEAST 24 HOURS IN ADVANCE IF YOU ARE UNABLE TO KEEP YOUR APPOINTMENT. [phone]  How to prepare for your Myoview test:  1. Do not eat or drink after midnight 2. No caffeine for 24 hours prior to test 3. No smoking 24 hours prior to test. 4. Your medication may be taken with water.  If your doctor stopped a medication because of this test, do not take that medication. 5. Ladies, please do not wear dresses.  Skirts or pants are appropriate. Please wear a short  sleeve shirt. 6. No perfume, cologne or lotion. 7. Wear comfortable walking shoes. No heels!      Follow-Up: At [facility], you and your health needs are our priority.  As part of our continuing mission to provide you with exceptional heart care, we have created designated Provider Care Teams.  These Care Teams include your primary Cardiologist (physician) and Advanced Practice Providers (APPs -  Physician Assistants and Nurse Practitioners) who all work  together to provide you with the care you need, when you need it.  Your next appointment:   6 week(s)  The format for your next appointment:   In Person  Provider:   Kate Sable, MD       Signed, Kate Sable, MD  01/02/2019 10:47 AM    [place]

## 2019-01-02 NOTE — Telephone Encounter (Signed)
Patient see today 01/02/19 by Dr. Garen Lah.

## 2019-01-02 NOTE — Patient Instructions (Signed)
Medication Instructions:  Your physician has recommended you make the following change in your medication:  1. STOP Atenolol 2. START Carvedilol 25 mg twice a day   *If you need a refill on your cardiac medications before your next appointment, please call your pharmacy*  Lab Work: None If you have labs (blood work) drawn today and your tests are completely normal, you will receive your results only by: Marland Kitchen MyChart Message (if you have MyChart) OR . A paper copy in the mail If you have any lab test that is abnormal or we need to change your treatment, we will call you to review the results.  Testing/Procedures: [place]  Your caregiver has ordered a Stress Test with nuclear imaging. The purpose of this test is to evaluate the blood supply to your heart muscle. This procedure is referred to as a "Non-Invasive Stress Test." This is because other than having an IV started in your vein, nothing is inserted or "invades" your body. Cardiac stress tests are done to find areas of poor blood flow to the heart by determining the extent of coronary artery disease (CAD). Some patients exercise on a treadmill, which naturally increases the blood flow to your heart, while others who are  unable to walk on a treadmill due to physical limitations have a pharmacologic/chemical stress agent called Lexiscan . This medicine will mimic walking on a treadmill by temporarily increasing your coronary blood flow.   Please note: these test may take anywhere between 2-4 hours to complete  PLEASE REPORT TO [facility] AT THE FIRST DESK WILL DIRECT YOU WHERE TO GO  Date of Procedure:_____________________________________  Arrival Time for Procedure:______________________________  Instructions regarding medication:    _XX___:  Hold Furosemide the morning of your test.  PLEASE NOTIFY THE OFFICE AT LEAST 24 HOURS IN ADVANCE IF YOU ARE UNABLE TO KEEP YOUR APPOINTMENT.   [phone] AND  PLEASE NOTIFY NUCLEAR MEDICINE AT [facility] AT LEAST 24 HOURS IN ADVANCE IF YOU ARE UNABLE TO KEEP YOUR APPOINTMENT. [phone]  How to prepare for your Myoview test:  1. Do not eat or drink after midnight 2. No caffeine for 24 hours prior to test 3. No smoking 24 hours prior to test. 4. Your medication may be taken with water.  If your doctor stopped a medication because of this test, do not take that medication. 5. Ladies, please do not wear dresses.  Skirts or pants are appropriate. Please wear a short sleeve shirt. 6. No perfume, cologne or lotion. 7. Wear comfortable walking shoes. No heels!      Follow-Up: At [facility], you and your health needs are our priority.  As part of our continuing mission to provide you with exceptional heart care, we have created designated Provider Care Teams.  These Care Teams include your primary Cardiologist (physician) and Advanced Practice Providers (APPs -  Physician Assistants and Nurse Practitioners) who all work together to provide you with the care you need, when you need it.  Your next appointment:   6 week(s)  The format for your next appointment:   In Person  Provider:   Kate Sable, MD

## 2019-01-02 NOTE — Telephone Encounter (Signed)
Patient kept his scheduled appt today with Dr. Garen Lah.

## 2019-01-03 ENCOUNTER — Ambulatory Visit: Payer: PRIVATE HEALTH INSURANCE | Admitting: Cardiology

## 2019-01-03 ENCOUNTER — Telehealth: Payer: Self-pay | Admitting: Cardiology

## 2019-01-03 MED ORDER — SPIRONOLACTONE 25 MG PO TABS: 25.0000 mg | ORAL_TABLET | Freq: Every day | ORAL | 3 refills | Status: DC

## 2019-01-03 NOTE — Telephone Encounter (Signed)
Spoke to patient to update him on POC from Dr. Sharlet Salina. Pt verbalized understanding and agreeable to POC. He did not take any medication or receive medical care. Lips swelling is decreasing.  Rx sent.  Advised pt to call for any further questions or concerns.

## 2019-01-03 NOTE — Telephone Encounter (Signed)
Incoming call from patient. He saw Dr. Garen Lah in clinic yesterday and was started on Losartan. Had lip swelling after taking medication this morning.   He denies any itching on throat face or body. No swelling outside of lips. No SOB or wheeze. No rash.   I asked that patient seek urgent medical care. He declined at this time. I said at the very least he should take benadryl and stop medication.   Routing message to Dr. Garen Lah for further instruction.

## 2019-01-03 NOTE — Telephone Encounter (Signed)
Pt c/o medication issue:  1. Name of Medication: Losartan  2. How are you currently taking this medication (dosage and times per day)? 100 mg daily  3. Are you having a reaction (difficulty breathing--STAT)? Lips swelling  4. What is your medication issue?

## 2019-01-08 NOTE — Telephone Encounter (Signed)
Patient calling to let us know he is picking up new medication and will start tomorrow.

## 2019-01-08 NOTE — Telephone Encounter (Signed)
Plan from Dr. Garen Lah that [place] relayed to the patient on 01/03/19.   ----- Message from Kate Sable, MD sent at 01/03/2019 3:38 PM EST -----  Ok to stop losartan due to lip swelling. Start spironolactone 25mg  qd. Thanks   Noted that the patient will be starting the new medication tomorrow.

## 2019-01-13 ENCOUNTER — Ambulatory Visit
Admission: RE | Admit: 2019-01-13 | Discharge: 2019-01-13 | Disposition: A | Discharge status: Home / Self Care | Payer: PRIVATE HEALTH INSURANCE | Source: Ambulatory Visit | Attending: Cardiology | Admitting: Cardiology

## 2019-01-13 ENCOUNTER — Other Ambulatory Visit: Payer: Self-pay

## 2019-01-13 DIAGNOSIS — R0602 Shortness of breath: Secondary | ICD-10-CM | POA: Diagnosis present

## 2019-01-13 LAB — NM MYOCAR MULTI W/SPECT W/WALL MOTION / EF
Estimated workload: 1 METS
Exercise duration (min): 0 min
Exercise duration (sec): 0 s
LV dias vol: 214 mL (ref 62–150)
LV sys vol: 103 mL
MPHR: 164 {beats}/min
Peak HR: 72 {beats}/min
Percent HR: 43 %
Rest HR: 59 {beats}/min
SDS: 3
SRS: 0
SSS: 4
TID: 0.96

## 2019-01-13 MED ORDER — REGADENOSON 0.4 MG/5ML IV SOLN
0.4000 mg | Freq: Once | INTRAVENOUS | Status: AC
  Administered 2019-01-13: 0.4 mg via INTRAVENOUS
  Filled 2019-01-13: qty 5

## 2019-01-13 MED ORDER — TECHNETIUM TC 99M TETROFOSMIN IV KIT
10.8900 | PACK | Freq: Once | INTRAVENOUS | Status: AC | PRN
  Administered 2019-01-13: 10.89 via INTRAVENOUS

## 2019-01-13 MED ORDER — TECHNETIUM TC 99M TETROFOSMIN IV KIT
30.0000 | PACK | Freq: Once | INTRAVENOUS | Status: AC | PRN
  Administered 2019-01-13: 31.39 via INTRAVENOUS

## 2019-01-14 ENCOUNTER — Telehealth: Payer: Self-pay

## 2019-01-14 NOTE — Unmapped (Addendum)
Update 12/2 - I called and left Duane [name] a message that Dr.Culton recommended he schedule a follow up to discuss side effects and potential alternatives. I provided the number to schedule [phone].  - MAS      [facility] Pharmacist Intervention    Type of intervention: Medication side effect    Medication: Duane [name]    Problem: Duane [name] called in to report he was experiencing lack of appetite and increased thirst after starting Duane [name] about a week ago. He doesn't want to eat much of anything, and is concerned about increased thirst given his ESRD status. He still makes some urine, and has been urinating more.    He asked about alternative agents that he could use instead. We discussed other biologic options as possibilities, but I advised we'd want to clear with derm/ID/neph before changing agents, and that he may need to be reseen in clinic.    Intervention: I advised it may be too early to tell if these will be long term side effects for him, but I would get Dr. Dulce Sellar thoughts and have her call him back/ I would.     Follow up needed: Call patient back with provider's response.     Approximate time spent: 25 minutes    Duane [name] Shared [facility] Specialty Pharmacist

## 2019-01-14 NOTE — Telephone Encounter (Signed)
-----   Message from Kate Sable, MD sent at 01/14/2019  8:27 AM EST ----- Mildly reduced EF, similar to echo. Artifacts noted. No clear evidence for ischemia.

## 2019-01-14 NOTE — Telephone Encounter (Signed)
Called to give the patient stress test. lmtcb.

## 2019-01-15 NOTE — Unmapped (Signed)
T[facility] called and left VM for Arlyss Repress CM at [phone] xt. [phone] in regards to eval status update.

## 2019-01-16 ENCOUNTER — Telehealth: Payer: Self-pay | Admitting: Cardiology

## 2019-01-16 NOTE — Telephone Encounter (Signed)
Pt c/o of Chest Pain: STAT if CP now or developed within 24 hours  1. Are you having CP right now? Not at this time   2. Are you experiencing any other symptoms (ex. SOB, nausea, vomiting, sweating)? Has pneumonia symptoms like he had before   3. How long have you been experiencing CP? Started yesterday evening   4. Is your CP continuous or coming and going? Comes and goes   5. Have you taken Nitroglycerin? No, doesn't have any

## 2019-01-16 NOTE — Telephone Encounter (Signed)
Spoke with the patient. Patient sts that he has already been made aware of stress test results with verbalized understanding.

## 2019-01-16 NOTE — Telephone Encounter (Signed)
Patient returning call.

## 2019-01-16 NOTE — Telephone Encounter (Signed)
Returned call to patient, he report isolated episode of deep L anterior chest pain that started last night. He reports slight cough and recent pneumonia. No fevers. He reports ongoing SOB on exertion since PNA dx 3 mo ago.   Advised pt to seek urgent medical care if cxp returns or sob worsens.   Pt has f/u 12/31.  Advised pt to call for any further questions or concerns.

## 2019-01-17 ENCOUNTER — Telehealth: Payer: Self-pay | Admitting: Family Medicine

## 2019-01-17 ENCOUNTER — Ambulatory Visit: Payer: Self-pay | Admitting: *Deleted

## 2019-01-17 NOTE — Telephone Encounter (Signed)
Copied from [place] [phone]. Topic: Referral - Request for Referral >> Jan 17, 2019  3:04 PM Erick Blinks wrote: Has patient seen PCP for this complaint? Yes.   *If NO, is insurance requiring patient see PCP for this issue before PCP can refer them? Referral for which specialty: Pulmonary  Preferred provider/office: (Pulmonary within [facility]) Reason for referral: Pr had pneumonia

## 2019-01-17 NOTE — Telephone Encounter (Signed)
Pt called to get a referral to see  pulmonary regarding his shortness of breath with exertion. He went to see the cardiologist and had a stress test done and it was negative. It was recommended that he sees a pulmonary provider. He is asking for a referral. He would like to be notified when the referral is done. Routing to [employer]  for review.

## 2019-01-17 NOTE — Telephone Encounter (Signed)
Pt is scheduled for Tuesday and would like a call back from Dr Ancil Boozer

## 2019-01-21 ENCOUNTER — Encounter: Payer: Self-pay | Admitting: Family Medicine

## 2019-01-21 ENCOUNTER — Other Ambulatory Visit: Payer: Self-pay

## 2019-01-21 ENCOUNTER — Ambulatory Visit (INDEPENDENT_AMBULATORY_CARE_PROVIDER_SITE_OTHER): Payer: PRIVATE HEALTH INSURANCE | Admitting: Family Medicine

## 2019-01-21 DIAGNOSIS — I10 Essential (primary) hypertension: Secondary | ICD-10-CM

## 2019-01-21 DIAGNOSIS — R05 Cough: Secondary | ICD-10-CM | POA: Diagnosis not present

## 2019-01-21 DIAGNOSIS — R0602 Shortness of breath: Secondary | ICD-10-CM

## 2019-01-21 DIAGNOSIS — Z992 Dependence on renal dialysis: Secondary | ICD-10-CM

## 2019-01-21 DIAGNOSIS — N186 End stage renal disease: Secondary | ICD-10-CM | POA: Diagnosis not present

## 2019-01-21 DIAGNOSIS — R059 Cough, unspecified: Secondary | ICD-10-CM

## 2019-01-21 DIAGNOSIS — I5022 Chronic systolic (congestive) heart failure: Secondary | ICD-10-CM

## 2019-01-21 NOTE — Progress Notes (Signed)
Name: Louis [name]   MRN: [id number]    DOB: 04/07/1960   Date:01/21/2019       Progress Note  Subjective  Chief Complaint  Chief Complaint  Patient presents with  . Chest Pain    Chest pain started after he had stress test on Monday. Onset was last Wednesday morning while he was working. He feels tightness in his chest when he coughs. He gets choked at night when he is sleeping and has to cough and feels tightness in his chest. Stress test was negative.  . Cough    Cough is better. Only coughing about once every two hours. He gets choked when he sleeps at night and has to cough and feels the tightness then.    I connected with  [name] on 01/21/19 at 11:00 AM EST by telephone and verified that I am speaking with the correct person using two identifiers.  I discussed the limitations, risks, security and privacy concerns of performing an evaluation and management service by telephone and the availability of in person appointments. Staff also discussed with the patient that there may be a patient responsible charge related to this service. Patient Location: at home  Provider Location: [facility]   HPI  Feeling weak: he states he had HD yesterday, he states today he woke up feeling weak, tired, some SOB with a activity, and chest pain with deep inspiration that radiates to his back. He denies nausea. He has seen Dr. Saunders Revel, had stress test about one week ago that showed CHF with non ischemic cardiomyopathy mild decrease in EF. He has intermittent episodes of fatigue and SOB he is requesting to see pulmonologist since symptoms of SOB worse since his pneumonia this Summer. Cough is finally improving however SOB is not better .   HTN: he had medication adjusted recently , bp still high on his last visit, he states he has been compliant with medications.   ESRD: having HD three times a week, he thinks he got the flu shot there. He states he had a lot of fluid this  week because he was very thirsty secondary to a medication he was given for his skin - but he stopped taking on his own, it was given for a rash on his elbows. He is doing better now. He is still able to void   He wants to hold off on colonoscopy for now    Patient Active Problem List   Diagnosis Date Noted  . Prolonged Q-T interval on ECG 06/15/2018  . Mild protein-calorie malnutrition ([place]) 05/01/2018  . Eczema 01/04/2018  . History of anemia due to CKD 05/24/2017  . ESRD on dialysis ([place]) 12/19/2016  . 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 Baker's cyst of knee, right 05/17/2016  . Neck muscle spasm 03/16/2016  . Hypertension 06/23/2010  . Rosacea 06/23/2010  . Human immunodeficiency virus (HIV) disease ([place]) 01/02/1996    Past Surgical History:  Procedure Laterality Date  . CAPD INSERTION N/A 12/28/2016   Procedure: LAPAROSCOPIC INSERTION CONTINUOUS AMBULATORY PERITONEAL DIALYSIS  (CAPD) CATHETER;  Surgeon: Algernon Huxley, MD;  Location: [facility] ORS;  Service: Vascular;  Laterality: N/A;  . DIALYSIS/PERMA CATHETER INSERTION N/A 10/09/2016   Procedure: DIALYSIS/PERMA CATHETER INSERTION;  Surgeon: Algernon Huxley, MD;  Location: [place] CV LAB;  Service: Cardiovascular;  Laterality: N/A;  . DIALYSIS/PERMA CATHETER REMOVAL N/A 02/19/2017   Procedure: DIALYSIS/PERMA CATHETER REMOVAL;  Surgeon: Algernon Huxley, MD;  Location: [place]  CV LAB;  Service: Cardiovascular;  Laterality: N/A;  . HERNIA REPAIR Bilateral    inguinal    Family History  Problem Relation Age of Onset  . Bradycardia Maternal Grandmother        with pacemaker    Social History   Socioeconomic History  . Marital status: Single    Spouse name: Not on file  . Number of children: 0  . Years of education: Not on file  . Highest education level: Associate degree: occupational, Hotel manager, or vocational program  Occupational History  . Not on file  Social Needs  . Financial resource strain: Not  very hard  . Food insecurity    Worry: Never true    Inability: Never true  . Transportation needs    Medical: No    Non-medical: No  Tobacco Use  . Smoking status: Never Smoker  . Smokeless tobacco: Never Used  Substance and Sexual Activity  . Alcohol use: Not Currently    Alcohol/week: 2.0 standard drinks    Types: 2 Standard drinks or equivalent per week    Comment: has not had any alcohol in a while  . Drug use: No  . Sexual activity: Yes    Partners: Female    Birth control/protection: Condom  Lifestyle  . Physical activity    Days per week: 0 days    Minutes per session: 0 min  . Stress: Only a little  Relationships  . Social connections    Talks on phone: More than three times a week    Gets together: More than three times a week    Attends religious service: More than 4 times per year    Active member of club or organization: No    Attends meetings of clubs or organizations: Never    Relationship status: Never married  . Intimate partner violence    Fear of current or ex partner: No    Emotionally abused: No    Physically abused: No    Forced sexual activity: No  Other Topics Concern  . Not on file  Social History Narrative  . Not on file     Current Outpatient Medications:  .  amLODipine (NORVASC) 10 MG tablet, Take 1 tablet (10 mg total) by mouth daily., Disp: 30 tablet, Rfl: 0 .  ANORO ELLIPTA 62.5-25 MCG/INH AEPB, TAKE 1 PUFF BY MOUTH EVERY DAY, Disp: 60 each, Rfl: 1 .  AURYXIA 1 GM 210 MG(Fe) tablet, Take 1 tablet by mouth 4 (four) times daily -  before meals and at bedtime., Disp: , Rfl:  .  B Complex-C-Folic Acid (RENA-VITE RX) 1 MG TABS, Take 1 tablet by mouth daily., Disp: , Rfl:  .  carvedilol (COREG) 25 MG tablet, Take 1 tablet (25 mg total) by mouth 2 (two) times daily., Disp: 180 tablet, Rfl: 3 .  cetirizine (ZYRTEC) 10 MG tablet, Take 1 tablet (10 mg total) by mouth daily., Disp: 30 tablet, Rfl: 11 .  cloNIDine (CATAPRES - DOSED IN MG/24 HR)  0.1 mg/24hr patch, Place 0.1 mg onto the skin once a week. , Disp: , Rfl:  .  DESCOVY 200-25 MG tablet, Take 1 tablet by mouth daily., Disp: , Rfl:  .  diclofenac sodium (VOLTAREN) 1 % GEL, Apply 2 g topically 4 (four) times daily., Disp: 100 g, Rfl: 0 .  Febuxostat 80 MG TABS, Take 1 tablet by mouth daily., Disp: , Rfl:  .  ferrous sulfate 325 (65 FE) MG tablet, Take 1 tablet by mouth  daily., Disp: , Rfl:  .  furosemide (LASIX) 40 MG tablet, Take 80 mg by mouth daily. , Disp: , Rfl:  .  halobetasol (ULTRAVATE) 0.05 % cream, APPLY TO AFFECTED AREA TWICE A DAY, Disp: , Rfl:  .  hydrALAZINE (APRESOLINE) 100 MG tablet, Take 100 mg by mouth 3 (three) times daily., Disp: , Rfl:  .  methocarbamol (ROBAXIN) 500 MG tablet, Take 500 mg by mouth 2 (two) times daily as needed. for pain, Disp: , Rfl:  .  predniSONE (DELTASONE) 10 MG tablet, Take 1 tablet (10 mg total) by mouth 2 (two) times daily as needed. Prn gout attack for 2 days, Disp: 10 tablet, Rfl: 0 .  spironolactone (ALDACTONE) 25 MG tablet, Take 1 tablet (25 mg total) by mouth daily., Disp: 90 tablet, Rfl: 3 .  TIVICAY 50 MG tablet, Take 1 tablet by mouth daily., Disp: , Rfl:  .  valACYclovir (VALTREX) 500 MG tablet, Take 500 mg by mouth daily as needed (for flare ups). , Disp: , Rfl:   Allergies  Allergen Reactions  . Losartan Swelling    Lip swelling  . Quinapril Hcl Swelling    Face swells    I personally reviewed active problem list, medication list, allergies, family history, social history, health maintenance with the patient/caregiver today.   ROS  Ten systems reviewed and is negative except as mentioned in HPI   Objective  Virtual encounter, vitals not obtained.  There is no height or weight on file to calculate BMI.  Physical Exam  Awake, alert and oriented   No results found for this or any previous visit (from the past 13 hour(s)).  PHQ2/9: Depression screen [facility] 2/9 01/21/2019 09/05/2018 08/19/2018 05/31/2018 05/10/2018   Decreased Interest 0 0 0 0 0  Down, Depressed, Hopeless 0 0 0 0 0  PHQ - 2 Score 0 0 0 0 0  Altered sleeping 0 0 3 3 0  Tired, decreased energy 0 0 1 3 0  Change in appetite 0 0 1 3 0  Feeling bad or failure about yourself  0 0 0 0 0  Trouble concentrating 0 0 0 0 0  Moving slowly or fidgety/restless 0 0 0 0 0  Suicidal thoughts 0 0 0 0 0  PHQ-9 Score 0 0 5 9 0  Difficult doing work/chores - Not difficult at all Not difficult at all - -  Some recent data might be hidden   PHQ-2/9 Result is negative.    Fall Risk: Fall Risk  09/05/2018 08/19/2018 05/31/2018 05/02/2018 01/04/2018  Falls in the past year? 0 0 0 0 0  Number falls in past yr: 0 0 0 0 -  Injury with Fall? 0 0 0 0 -  Comment - - - - -  Follow up - - - - -     Assessment & Plan  1. Chronic systolic heart failure ([place])  Explained findings to patient   2. Cough  - Ambulatory referral to Pulmonology  3. SOB (shortness of breath)  - Ambulatory referral to Pulmonology  4. ESRD on dialysis ([place])   5. Essential hypertension  I discussed the assessment and treatment plan with the patient. The patient was provided an opportunity to ask questions and all were answered. The patient agreed with the plan and demonstrated an understanding of the instructions.   The patient was advised to call back or seek an in-person evaluation if the symptoms worsen or if the condition fails to improve as anticipated.  I  provided 25  minutes of non-face-to-face time during this encounter.  Loistine Chance, MD

## 2019-01-31 ENCOUNTER — Ambulatory Visit (INDEPENDENT_AMBULATORY_CARE_PROVIDER_SITE_OTHER): Payer: PRIVATE HEALTH INSURANCE | Admitting: Internal Medicine

## 2019-01-31 ENCOUNTER — Encounter: Payer: Self-pay | Admitting: Internal Medicine

## 2019-01-31 ENCOUNTER — Other Ambulatory Visit
Admission: RE | Admit: 2019-01-31 | Discharge: 2019-01-31 | Disposition: A | Discharge status: Home / Self Care | Payer: PRIVATE HEALTH INSURANCE | Attending: Internal Medicine | Admitting: Internal Medicine

## 2019-01-31 ENCOUNTER — Other Ambulatory Visit: Payer: Self-pay

## 2019-01-31 VITALS — BP 138/88 | HR 75 | Temp 97.7°F | Ht 74.0 in | Wt 190.0 lb

## 2019-01-31 DIAGNOSIS — R0600 Dyspnea, unspecified: Secondary | ICD-10-CM | POA: Diagnosis present

## 2019-01-31 DIAGNOSIS — I519 Heart disease, unspecified: Secondary | ICD-10-CM

## 2019-01-31 DIAGNOSIS — R0989 Other specified symptoms and signs involving the circulatory and respiratory systems: Secondary | ICD-10-CM | POA: Diagnosis not present

## 2019-01-31 DIAGNOSIS — N189 Chronic kidney disease, unspecified: Secondary | ICD-10-CM | POA: Diagnosis not present

## 2019-01-31 DIAGNOSIS — R0602 Shortness of breath: Secondary | ICD-10-CM

## 2019-01-31 DIAGNOSIS — R0609 Other forms of dyspnea: Secondary | ICD-10-CM

## 2019-01-31 DIAGNOSIS — Z862 Personal history of diseases of the blood and blood-forming organs and certain disorders involving the immune mechanism: Secondary | ICD-10-CM

## 2019-01-31 LAB — CBC WITH DIFFERENTIAL/PLATELET
Abs Immature Granulocytes: 0.02 10*3/uL (ref 0.00–0.07)
Basophils Absolute: 0 10*3/uL (ref 0.0–0.1)
Basophils Relative: 1 %
Eosinophils Absolute: 0.1 10*3/uL (ref 0.0–0.5)
Eosinophils Relative: 2 %
HCT: 26.7 % — ABNORMAL LOW (ref 39.0–52.0)
Hemoglobin: 8.8 g/dL — ABNORMAL LOW (ref 13.0–17.0)
Immature Granulocytes: 0 %
Lymphocytes Relative: 12 %
Lymphs Abs: 0.6 10*3/uL — ABNORMAL LOW (ref 0.7–4.0)
MCH: 36.5 pg — ABNORMAL HIGH (ref 26.0–34.0)
MCHC: 33 g/dL (ref 30.0–36.0)
MCV: 110.8 fL — ABNORMAL HIGH (ref 80.0–100.0)
Monocytes Absolute: 0.4 10*3/uL (ref 0.1–1.0)
Monocytes Relative: 9 %
Neutro Abs: 3.6 10*3/uL (ref 1.7–7.7)
Neutrophils Relative %: 76 %
Platelets: 158 10*3/uL (ref 150–400)
RBC: 2.41 MIL/uL — ABNORMAL LOW (ref 4.22–5.81)
RDW: 15.1 % (ref 11.5–15.5)
Smear Review: NORMAL
WBC: 4.8 10*3/uL (ref 4.0–10.5)
nRBC: 0 % (ref 0.0–0.2)

## 2019-01-31 NOTE — Patient Instructions (Addendum)
ICD-10-CM   1. Dyspnea on exertion  R06.00   2. History of anemia due to CKD  N18.9    Z86.2   3. Bibasilar crackles  R09.89   4. Chronic left ventricular systolic dysfunction  A999333     Do HRCT supine and prone on ILD protocol Do CBC with diff 01/31/2019 Do PFT first available   - all tests in [place]  Followup  - 15 min telephone visit after HRCT to discuss CT results with Dr Chase Caller  - need not wait for PFT to have tele visit

## 2019-01-31 NOTE — Progress Notes (Signed)
OV 01/31/2019  Subjective:  Patient ID: Louis [name], male , DOB: 1961/02/02 , age 58 y.o. , MRN: [id number] , ADDRESS: [place] [id number]   01/31/2019 -   Chief Complaint  Patient presents with  . pulmonary consult    per Dr. Ancil Boozer. dx with PNA 10/2018. since he has had sob with exertion.      HPI Louis [name] 58 y.o. -referred by primary care physician for shortness of breath on exertion relieved by rest.  He tells me that he has end-stage renal disease for the last 2 years.  He says at baseline he was doing really well with an excellent quality of life and functional status.  Then in September 2020 he had cough and shortness of breath acutely.  This was diagnosis pneumonia accordingly to him.  He was given short course antibiotics.  He says that he does not recollect if he had a fever.  And then after that a lot of the symptoms improved except the shortness of breath.  This is present on exertion relieved by rest.  It is mild to moderate in severity.  It is not progressive since it started.  However it is limiting him from doing exercises.  He says he had a cardiac work-up and therefore he was cleared.  Review of cardiac work-up indicates that his stress test on January 13, 2019 was low risk but his ejection fraction is 45-50%.  He is not aware he has chronic systolic dysfunction.  He has blood work in April 2020 showing he is anemic with a hemoglobin in the 9 g% range.  This is worse than before.  He is not aware that he is anemic.  He denies any current cough wheezing, orthopnea, proximal nocturnal dyspnea, edema.  He goes for Monday Wednesday Friday dialysis.  He says after the dialysis he feels better.  He says the shortness of breath is associated with lower chest tightness especially in the infrascapular region.  His walking desaturation test today was fine.     Simple office walk 185 feet x  3 laps goal with forehead probe 01/31/2019   O2 used  RA  Number laps completed 3  Comments about pace Moderate pace  Resting Pulse Ox/HR 98% and 67/min  Final Pulse Ox/HR 97% and 69/min  Desaturated </= 88% no  Desaturated <= 3% points no  Got Tachycardic >/= 90/min no  Symptoms at end of test Very mild dyspnea  Miscellaneous comments x    CXR Oct 2020 - personally visualized - clear (CT abd lung cut 2014 - LL atelectasis v scarrring)   ROS - per HPI  Results for Louis [name], Louis [name] (MRN [id number]) as of 01/31/2019 11:18  Ref. Range 05/01/2018 11:20 05/24/2018 15:34  Hemoglobin Latest Ref Range: 13.0 - 17.0 g/dL 10.8 (L) 9.4 (L)  Results for Louis [name], Louis [name] (MRN [id number]) as of 01/31/2019 11:18  Ref. Range 05/24/2018 15:34  Creatinine Latest Ref Range: 0.61 - 1.24 mg/dL 18.08 (H)     has a past medical history of Chronic kidney disease, Dermatophytosis of foot (06/23/2004), Environmental allergies, Gout, HIV infection ([place]) (12/1995), Hypertension, and Seborrhea (06/23/2004).   reports that he has never smoked. He has never used smokeless tobacco.  Past Surgical History:  Procedure Laterality Date  . CAPD INSERTION N/A 12/28/2016   Procedure: LAPAROSCOPIC INSERTION CONTINUOUS AMBULATORY PERITONEAL DIALYSIS  (CAPD) CATHETER;  Surgeon: Algernon Huxley, MD;  Location: [facility] ORS;  Service: Vascular;  Laterality: N/A;  . DIALYSIS/PERMA CATHETER INSERTION N/A 10/09/2016   Procedure: DIALYSIS/PERMA CATHETER INSERTION;  Surgeon: Algernon Huxley, MD;  Location: [place] CV LAB;  Service: Cardiovascular;  Laterality: N/A;  . DIALYSIS/PERMA CATHETER REMOVAL N/A 02/19/2017   Procedure: DIALYSIS/PERMA CATHETER REMOVAL;  Surgeon: Algernon Huxley, MD;  Location: [place] CV LAB;  Service: Cardiovascular;  Laterality: N/A;  . HERNIA REPAIR Bilateral    inguinal    Allergies  Allergen Reactions  . Losartan Swelling    Lip swelling  . Quinapril Hcl Swelling    Face swells    Immunization History  Administered Date(s) Administered  .  Hepatitis A 07/11/2012  . Hepatitis A, Adult 03/13/2013  . Hepatitis B 06/22/2004, 07/11/2012  . Hepatitis B, adult 11/07/2012, 03/13/2013, 01/17/2017, 02/18/2018  . Influenza Split 11/03/2011  . Influenza Whole 01/02/1996  . Influenza,inj,Quad PF,6+ Mos 10/05/2017  . Influenza-Unspecified 10/31/2012, 02/15/2016, 11/28/2016, 03/04/2018, 11/13/2018  . PPD Test 10/08/2016, 02/11/2018, 02/18/2018  . Pneumococcal Conjugate-13 05/10/2018  . Pneumococcal Polysaccharide-23 06/22/2004, 02/28/2012  . Tdap 10/05/2017    Family History  Problem Relation Age of Onset  . Bradycardia Maternal Grandmother        with pacemaker     Current Outpatient Medications:  .  amLODipine (NORVASC) 10 MG tablet, Take 1 tablet (10 mg total) by mouth daily., Disp: 30 tablet, Rfl: 0 .  ANORO ELLIPTA 62.5-25 MCG/INH AEPB, TAKE 1 PUFF BY MOUTH EVERY DAY, Disp: 60 each, Rfl: 1 .  AURYXIA 1 GM 210 MG(Fe) tablet, Take 1 tablet by mouth 4 (four) times daily -  before meals and at bedtime., Disp: , Rfl:  .  B Complex-C-Folic Acid (RENA-VITE RX) 1 MG TABS, Take 1 tablet by mouth daily., Disp: , Rfl:  .  carvedilol (COREG) 25 MG tablet, Take 1 tablet (25 mg total) by mouth 2 (two) times daily., Disp: 180 tablet, Rfl: 3 .  cetirizine (ZYRTEC) 10 MG tablet, Take 1 tablet (10 mg total) by mouth daily., Disp: 30 tablet, Rfl: 11 .  cloNIDine (CATAPRES - DOSED IN MG/24 HR) 0.1 mg/24hr patch, Place 0.1 mg onto the skin once a week. , Disp: , Rfl:  .  DESCOVY 200-25 MG tablet, Take 1 tablet by mouth daily., Disp: , Rfl:  .  diclofenac sodium (VOLTAREN) 1 % GEL, Apply 2 g topically 4 (four) times daily., Disp: 100 g, Rfl: 0 .  Febuxostat 80 MG TABS, Take 1 tablet by mouth daily., Disp: , Rfl:  .  ferrous sulfate 325 (65 FE) MG tablet, Take 1 tablet by mouth daily., Disp: , Rfl:  .  furosemide (LASIX) 40 MG tablet, Take 80 mg by mouth daily. , Disp: , Rfl:  .  halobetasol (ULTRAVATE) 0.05 % cream, APPLY TO AFFECTED AREA TWICE A  DAY, Disp: , Rfl:  .  hydrALAZINE (APRESOLINE) 100 MG tablet, Take 100 mg by mouth 3 (three) times daily., Disp: , Rfl:  .  methocarbamol (ROBAXIN) 500 MG tablet, Take 500 mg by mouth 2 (two) times daily as needed. for pain, Disp: , Rfl:  .  predniSONE (DELTASONE) 10 MG tablet, Take 1 tablet (10 mg total) by mouth 2 (two) times daily as needed. Prn gout attack for 2 days, Disp: 10 tablet, Rfl: 0 .  spironolactone (ALDACTONE) 25 MG tablet, Take 1 tablet (25 mg total) by mouth daily., Disp: 90 tablet, Rfl: 3 .  TIVICAY 50 MG tablet, Take 1 tablet by mouth daily., Disp: , Rfl:  .  valACYclovir (VALTREX) 500  MG tablet, Take 500 mg by mouth daily as needed (for flare ups). , Disp: , Rfl:       Objective:   Vitals:   01/31/19 1112  BP: 138/88  Pulse: 75  Temp: 97.7 F (36.5 C)  TempSrc: Temporal  SpO2: 100%  Weight: 190 lb (86.2 kg)  Height: 6\' 2"  (1.88 m)    Estimated body mass index is 24.39 kg/m as calculated from the following:   Height as of this encounter: 6\' 2"  (1.88 m).   Weight as of this encounter: 190 lb (86.2 kg).  @WEIGHTCHANGE @  [employer]   01/31/19 1112  Weight: 190 lb (86.2 kg)     Physical Exam  General Appearance:    Alert, cooperative, no distress, appears stated age - yes , Deconditioned looking - no , OBESE  - no, Sitting on Wheelchair -  no  Head:    Normocephalic, without obvious abnormality, atraumatic  Eyes:    PERRL, conjunctiva/corneas clear,  Ears:    Normal TM's and external ear canals, both ears  Nose:   Nares normal, septum midline, mucosa normal, no drainage    or sinus tenderness. OXYGEN ON  - no . Patient is @ ra   Throat:   Lips, mucosa, and tongue normal; teeth and gums normal. Cyanosis on lips - no  Neck:   Supple, symmetrical, trachea midline, no adenopathy;    thyroid:  no enlargement/tenderness/nodules; no carotid   bruit or JVD  Back:     Symmetric, no curvature, ROM normal, no CVA tenderness  Lungs:     Distress - no , Wheeze  no, Barrell Chest - no, Purse lip breathing - no, Crackles - YES VELCRO AT LUNG BASE   Chest Wall:    No tenderness or deformity.    Heart:    Regular rate and rhythm, S1 and S2 normal, no rub   or gallop, Murmur - no  Breast Exam:    NOT DONE  Abdomen:     Soft, non-tender, bowel sounds active all four quadrants,    no masses, no organomegaly. Visceral obesity - no  Genitalia:   NOT DONE  Rectal:   NOT DONE  Extremities:   Extremities - normal, Has Cane - no, Clubbing - no, Edema - no  Pulses:   2+ and symmetric all extremities  Skin:   Stigmata of Connective Tissue Disease - no  Lymph nodes:   Cervical, supraclavicular, and axillary nodes normal  Psychiatric:  Neurologic:   Pleasant - yes, Anxious - no, Flat affect - no  CAm-ICU - neg, Alert and Oriented x 3 - yes, Moves all 4s - yes, Speech - normal, Cognition - intact           Assessment:       ICD-10-CM   1. Dyspnea on exertion  R06.00   2. History of anemia due to CKD  N18.9    Z86.2   3. Bibasilar crackles  R09.89   4. Chronic left ventricular systolic dysfunction  A999333   5. Shortness of breath  R06.02    If he still is anemic and in addition he currently has chronic systolic dysfunction.  Therefore these are definitely playing a role in his dyspnea on exertion relieved by rest.  However he might have underlying interstitial lung disease based on the fact he is crackles at the lung base.  He looks pretty euvolemic at this point.  Therefore we should get pulmonary function testing and high-resolution CT chest.  There are delays with pulmonary function testing because of COVID-19.  Therefore we will see him for a phone visit to discuss his CT chest results.  He is agreeable with the plan.  Also testing for anemia.    Plan:     Patient Instructions     ICD-10-CM   1. Dyspnea on exertion  R06.00   2. History of anemia due to CKD  N18.9    Z86.2   3. Bibasilar crackles  R09.89   4. Chronic left ventricular systolic  dysfunction  A999333     Do HRCT supine and prone on ILD protocol Do CBC with diff 01/31/2019 Do PFT first available   - all tests in [place]  Followup  - 15 min telephone visit after HRCT to discuss CT results with Dr Chase Caller  - need not wait for PFT to have tele visit       SIGNATURE    Dr. Brand Males, M.D., F.C.C.P,  Pulmonary and Critical Care Medicine Staff Physician, [place] Director - Interstitial Lung Disease  Program  Pulmonary [place] at [place], [place], [address]  Pager: [phone], If no answer or between  15:00h - 7:00h: call [phone] Telephone: [phone]  11:46 AM 01/31/2019

## 2019-02-03 NOTE — Unmapped (Signed)
I spoke with Duane [name], and he decided to stop his [place]. He has regained his appetite. I reminded him he needed to make a follow up appt - he will call today. We will remove him from our call list until a new specialty medication is received.    This patient has been disenrolled from the [facility] specialty pharmacy services due to medication discontinuation resulting from side effect intolerance.    Lanney Gins  [facility] Specialty Pharmacist

## 2019-02-03 NOTE — Unmapped (Signed)
Appointment 12/28 in [place]    Thanks!

## 2019-02-03 NOTE — Unmapped (Signed)
Per Dr. Orma Flaming, she called weeks ago so if he has lingering issues, he will need to schedule an appointment.

## 2019-02-03 NOTE — Unmapped (Signed)
Patient called in stated that he's returning Dr.culton call. He said it's about a medication

## 2019-02-03 NOTE — Unmapped (Signed)
Did you call Mr. Roxan Hockey?

## 2019-02-10 ENCOUNTER — Encounter
Admit: 2019-02-10 | Discharge: 2019-02-11 | Payer: MEDICARE | Attending: Student in an Organized Health Care Education/Training Program | Primary: Student in an Organized Health Care Education/Training Program

## 2019-02-10 DIAGNOSIS — L409 Psoriasis, unspecified: Principal | ICD-10-CM

## 2019-02-10 MED ORDER — CLOBETASOL 0.05 % TOPICAL OINTMENT
Freq: Two times a day (BID) | TOPICAL | 3 refills | 0 days | Status: CP
Start: 2019-02-10 — End: 2020-02-10

## 2019-02-13 ENCOUNTER — Ambulatory Visit: Payer: PRIVATE HEALTH INSURANCE | Admitting: Nurse Practitioner

## 2019-02-18 ENCOUNTER — Ambulatory Visit
Admission: RE | Admit: 2019-02-18 | Discharge: 2019-02-18 | Disposition: A | Discharge status: Home / Self Care | Payer: PRIVATE HEALTH INSURANCE | Source: Ambulatory Visit | Attending: Internal Medicine | Admitting: Internal Medicine

## 2019-02-18 ENCOUNTER — Other Ambulatory Visit: Payer: Self-pay

## 2019-02-18 ENCOUNTER — Telehealth: Payer: Self-pay

## 2019-02-18 DIAGNOSIS — R0602 Shortness of breath: Secondary | ICD-10-CM | POA: Insufficient documentation

## 2019-02-18 NOTE — Telephone Encounter (Signed)
Pt is aware of date/time of covid test.   

## 2019-02-19 ENCOUNTER — Other Ambulatory Visit
Admission: RE | Admit: 2019-02-19 | Discharge: 2019-02-19 | Disposition: A | Discharge status: Home / Self Care | Payer: PRIVATE HEALTH INSURANCE | Source: Ambulatory Visit | Attending: Internal Medicine | Admitting: Internal Medicine

## 2019-02-19 DIAGNOSIS — Z01812 Encounter for preprocedural laboratory examination: Secondary | ICD-10-CM | POA: Diagnosis not present

## 2019-02-19 DIAGNOSIS — Z20822 Contact with and (suspected) exposure to covid-19: Secondary | ICD-10-CM | POA: Diagnosis not present

## 2019-02-19 LAB — SARS CORONAVIRUS 2 (TAT 6-24 HRS): SARS Coronavirus 2: NEGATIVE

## 2019-02-20 ENCOUNTER — Ambulatory Visit: Payer: PRIVATE HEALTH INSURANCE

## 2019-02-20 ENCOUNTER — Other Ambulatory Visit: Payer: Self-pay

## 2019-02-20 ENCOUNTER — Ambulatory Visit: Payer: PRIVATE HEALTH INSURANCE | Attending: Internal Medicine

## 2019-02-20 DIAGNOSIS — R0602 Shortness of breath: Secondary | ICD-10-CM | POA: Diagnosis not present

## 2019-02-20 DIAGNOSIS — J984 Other disorders of lung: Secondary | ICD-10-CM | POA: Insufficient documentation

## 2019-02-20 DIAGNOSIS — R0600 Dyspnea, unspecified: Secondary | ICD-10-CM

## 2019-02-20 MED ORDER — ALBUTEROL SULFATE (2.5 MG/3ML) 0.083% IN NEBU
2.5000 mg | INHALATION_SOLUTION | Freq: Once | RESPIRATORY_TRACT | Status: AC
  Administered 2019-02-20: 2.5 mg via RESPIRATORY_TRACT
  Filled 2019-02-20: qty 3

## 2019-02-21 ENCOUNTER — Ambulatory Visit (INDEPENDENT_AMBULATORY_CARE_PROVIDER_SITE_OTHER): Payer: PRIVATE HEALTH INSURANCE | Admitting: Internal Medicine

## 2019-02-21 DIAGNOSIS — Z862 Personal history of diseases of the blood and blood-forming organs and certain disorders involving the immune mechanism: Secondary | ICD-10-CM

## 2019-02-21 DIAGNOSIS — N189 Chronic kidney disease, unspecified: Secondary | ICD-10-CM | POA: Diagnosis not present

## 2019-02-21 DIAGNOSIS — J439 Emphysema, unspecified: Secondary | ICD-10-CM

## 2019-02-21 DIAGNOSIS — R0600 Dyspnea, unspecified: Secondary | ICD-10-CM | POA: Diagnosis not present

## 2019-02-21 DIAGNOSIS — J849 Interstitial pulmonary disease, unspecified: Secondary | ICD-10-CM

## 2019-02-21 DIAGNOSIS — Z9189 Other specified personal risk factors, not elsewhere classified: Secondary | ICD-10-CM

## 2019-02-21 DIAGNOSIS — I519 Heart disease, unspecified: Secondary | ICD-10-CM

## 2019-02-21 DIAGNOSIS — R0609 Other forms of dyspnea: Secondary | ICD-10-CM

## 2019-02-21 NOTE — Progress Notes (Signed)
OV 01/31/2019  Subjective:  Patient ID: Louis [name], male , DOB: Mar 06, 1960 , age 59 y.o. , MRN: [id number] , ADDRESS: [place] [address] PCP Steele Sizer, MD   01/31/2019 -   Chief Complaint  Patient presents with  . pulmonary consult    per Dr. Ancil Boozer. dx with PNA 10/2018. since he has had sob with exertion.      HPI Louis [name] 59 y.o. -referred by primary care physician for shortness of breath on exertion relieved by rest.  He tells me that he has end-stage renal disease for the last 2 years.  He says at baseline he was doing really well with an excellent quality of life and functional status.  Then in September 2020 he had cough and shortness of breath acutely.  This was diagnosis pneumonia accordingly to him.  He was given short course antibiotics.  He says that he does not recollect if he had a fever.  And then after that a lot of the symptoms improved except the shortness of breath.  This is present on exertion relieved by rest.  It is mild to moderate in severity.  It is not progressive since it started.  However it is limiting him from doing exercises.  He says he had a cardiac work-up and therefore he was cleared.  Review of cardiac work-up indicates that his stress test on January 13, 2019 was low risk but his ejection fraction is 45-50%.  He is not aware he has chronic systolic dysfunction.  He has blood work in April 2020 showing he is anemic with a hemoglobin in the 9 g% range.  This is worse than before.  He is not aware that he is anemic.  He denies any current cough wheezing, orthopnea, proximal nocturnal dyspnea, edema.  He goes for Monday Wednesday Friday dialysis.  He says after the dialysis he feels better.  He says the shortness of breath is associated with lower chest tightness especially in the infrascapular region.  His walking desaturation test today was fine.     Simple office walk 185 feet x  3 laps goal with forehead probe  01/31/2019   O2 used RA  Number laps completed 3  Comments about pace Moderate pace  Resting Pulse Ox/HR 98% and 67/min  Final Pulse Ox/HR 97% and 69/min  Desaturated </= 88% no  Desaturated <= 3% points no  Got Tachycardic >/= 90/min no  Symptoms at end of test Very mild dyspnea  Miscellaneous comments x    CXR Oct 2020 - personally visualized - clear (CT abd lung cut 2014 - LL atelectasis v scarrring)   ROS - per HPI  Results for Louis [name], Louis [name] (MRN [id number]) as of 01/31/2019 11:18  Ref. Range 05/01/2018 11:20 05/24/2018 15:34  Hemoglobin Latest Ref Range: 13.0 - 17.0 g/dL 10.8 (L) 9.4 (L)  Results for Louis [name], Louis [name] (MRN [id number]) as of 01/31/2019 11:18  Ref. Range 05/24/2018 15:34  Creatinine Latest Ref Range: 0.61 - 1.24 mg/dL 18.08 (H)     OV 02/21/2019  Subjective:  Patient ID: Louis [name], male , DOB: [birth year] , age 26 y.o. , MRN: [id number] , ADDRESS: [place] [address]   02/21/2019 -  telephon visit to discuss HRCT/cbc results in setting of ESRD x 3 years, chronicy systolic chf,  Anemia (on iron since 3 years) , crackles and symptoms of dyspnea.     HPI Louis [name] 59 y.o. -in this  telephone visit the risks, benefits and limitations of the telephone visit were discussed.  The purpose of this visit is to discuss this dyspnea work-up in terms of the results of his pulmonary function tests, CBC and also high-resolution CT chest.  The pulmonary function test clearly shows restriction with reduction in diffusion capacity suggestive of ILD.  The high-resolution CT chest suggests he might have ILD.  Although personal visualization it looks to me that he might have post pneumonia atelectasis but certainly ILD cannot be ruled out.  I agree with the radiologist on this.  If this were UIP it would be indeterminate which means lower than 50% probably of UIP.  His CBC shows worsening anemia.  He tells me that he has been on iron tablets for 3  years and starting dialysis.  Despite this his hemoglobin seems to be getting worse.  In addition high-resolution CT chest shows emphysema.  He says he has not smoked and he does not smoke but is around a lot of smokers for many years because he lives in a basement.  He is aware this emphysema finding could be passive smoking related.  He is on Anoro but he says it is not helping him.    PFT FVC fev1 ratio BD fev1 TLC DLCO uncorr  02/20/2019  3.38L/71% 2.78L/70% x/97% x 4.4L/56% 20.7/65%      Results for Louis [name], Louis [name] (MRN [id number]) as of 02/21/2019 09:39  Ref. Range 12/21/2016 15:05 12/28/2016 10:48 01/02/2017 09:06 04/29/2017 07:52 06/01/2017 09:23 05/01/2018 11:20 05/24/2018 15:34 01/31/2019 12:36  Hemoglobin Latest Ref Range: 13.0 - 17.0 g/dL 12.9 (L) 13.9 13.2 10.1 (L) 9.8 (L) 10.8 (L) 9.4 (L) 8.8 (L)    IMPRESSION: 1. There is a spectrum of findings which could be consistent with early or mild interstitial lung disease, at this time considered indeterminate for usual interstitial pneumonia (UIP) per current ATS guidelines. If there is persistent clinical concern for interstitial lung disease, repeat high-resolution chest CT would be recommended in 12 months to assess for temporal changes in the appearance of the lung parenchyma. 2. Mild diffuse bronchial wall thickening with mild centrilobular emphysema. 3. Right lower lobe pulmonary nodule measuring 9 x 5 mm (mean diameter of 7 mm), favored to represent a subpleural lymph node, but nonspecific. Attention at time of repeat high-resolution chest CT is recommended to ensure stability. 4. Mild cardiomegaly. 5. Aortic atherosclerosis, in addition to 3 vessel coronary artery disease. Please note that although the presence of coronary artery calcium documents the prsence of coronary artery disease, the severity of this disease and any potential stenosis cannot be assessed on this non-gated CT examination. Assessment for  potential risk factor modification, dietary therapy or pharmacologic therapy may be warranted, if cliniclly indicated.  Aortic Atherosclerosis (ICD10-I70.0) and Emphysema (ICD10-J43.9).   Electronically Signed   By: Vinnie Langton M.D.   On: 02/18/2019 16:46   ROS - per HPI     has a past medical history of Chronic kidney disease, Dermatophytosis of foot (06/23/2004), Environmental allergies, Gout, HIV infection ([place]) (12/1995), Hypertension, and Seborrhea (06/23/2004).   reports that he has never smoked. He has never used smokeless tobacco. Only smoked MJ in college. But around a lot of tobacco smokers in the basement - long time  Past Surgical History:  Procedure Laterality Date  . CAPD INSERTION N/A 12/28/2016   Procedure: LAPAROSCOPIC INSERTION CONTINUOUS AMBULATORY PERITONEAL DIALYSIS  (CAPD) CATHETER;  Surgeon: Algernon Huxley, MD;  Location: [facility] ORS;  Service: Vascular;  Laterality:  N/A;  . DIALYSIS/PERMA CATHETER INSERTION N/A 10/09/2016   Procedure: DIALYSIS/PERMA CATHETER INSERTION;  Surgeon: Algernon Huxley, MD;  Location: [facility];  Service: Cardiovascular;  Laterality: N/A;  . DIALYSIS/PERMA CATHETER REMOVAL N/A 02/19/2017   Procedure: DIALYSIS/PERMA CATHETER REMOVAL;  Surgeon: Algernon Huxley, MD;  Location: [facility];  Service: Cardiovascular;  Laterality: N/A;  . HERNIA REPAIR Bilateral    inguinal    Allergies  Allergen Reactions  . Losartan Swelling    Lip swelling  . Quinapril Hcl Swelling    Face swells    Immunization History  Administered Date(s) Administered  . Hepatitis A 07/11/2012  . Hepatitis A, Adult 03/13/2013  . Hepatitis B 06/22/2004, 07/11/2012  . Hepatitis B, adult 11/07/2012, 03/13/2013, 01/17/2017, 02/18/2018  . Influenza Split 11/03/2011  . Influenza Whole 01/02/1996  . Influenza,inj,Quad PF,6+ Mos 10/05/2017  . Influenza-Unspecified 10/31/2012, 02/15/2016, 11/28/2016, 03/04/2018, 11/13/2018  . PPD Test 10/08/2016,  02/11/2018, 02/18/2018  . Pneumococcal Conjugate-13 05/10/2018  . Pneumococcal Polysaccharide-23 06/22/2004, 02/28/2012  . Tdap 10/05/2017    Family History  Problem Relation Age of Onset  . Bradycardia Maternal Grandmother        with pacemaker     Current Outpatient Medications:  .  amLODipine (NORVASC) 10 MG tablet, Take 1 tablet (10 mg total) by mouth daily., Disp: 30 tablet, Rfl: 0 .  ANORO ELLIPTA 62.5-25 MCG/INH AEPB, TAKE 1 PUFF BY MOUTH EVERY DAY, Disp: 60 each, Rfl: 1 .  AURYXIA 1 GM 210 MG(Fe) tablet, Take 1 tablet by mouth 4 (four) times daily -  before meals and at bedtime., Disp: , Rfl:  .  B Complex-C-Folic Acid (RENA-VITE RX) 1 MG TABS, Take 1 tablet by mouth daily., Disp: , Rfl:  .  carvedilol (COREG) 25 MG tablet, Take 1 tablet (25 mg total) by mouth 2 (two) times daily., Disp: 180 tablet, Rfl: 3 .  cetirizine (ZYRTEC) 10 MG tablet, Take 1 tablet (10 mg total) by mouth daily., Disp: 30 tablet, Rfl: 11 .  cloNIDine (CATAPRES - DOSED IN MG/24 HR) 0.1 mg/24hr patch, Place 0.1 mg onto the skin once a week. , Disp: , Rfl:  .  DESCOVY 200-25 MG tablet, Take 1 tablet by mouth daily., Disp: , Rfl:  .  diclofenac sodium (VOLTAREN) 1 % GEL, Apply 2 g topically 4 (four) times daily., Disp: 100 g, Rfl: 0 .  Febuxostat 80 MG TABS, Take 1 tablet by mouth daily., Disp: , Rfl:  .  ferrous sulfate 325 (65 FE) MG tablet, Take 1 tablet by mouth daily., Disp: , Rfl:  .  furosemide (LASIX) 40 MG tablet, Take 80 mg by mouth daily. , Disp: , Rfl:  .  halobetasol (ULTRAVATE) 0.05 % cream, APPLY TO AFFECTED AREA TWICE A DAY, Disp: , Rfl:  .  hydrALAZINE (APRESOLINE) 100 MG tablet, Take 100 mg by mouth 3 (three) times daily., Disp: , Rfl:  .  methocarbamol (ROBAXIN) 500 MG tablet, Take 500 mg by mouth 2 (two) times daily as needed. for pain, Disp: , Rfl:  .  predniSONE (DELTASONE) 10 MG tablet, Take 1 tablet (10 mg total) by mouth 2 (two) times daily as needed. Prn gout attack for 2 days, Disp:  10 tablet, Rfl: 0 .  spironolactone (ALDACTONE) 25 MG tablet, Take 1 tablet (25 mg total) by mouth daily., Disp: 90 tablet, Rfl: 3 .  TIVICAY 50 MG tablet, Take 1 tablet by mouth daily., Disp: , Rfl:  .  valACYclovir (VALTREX) 500 MG  tablet, Take 500 mg by mouth daily as needed (for flare ups). , Disp: , Rfl:       Objective:   There were no vitals filed for this visit.  Estimated body mass index is 24.39 kg/m as calculated from the following:   Height as of 01/31/19: '6\' 2"'$  (1.88 m).   Weight as of 01/31/19: 190 lb (86.2 kg).  '@WEIGHTCHANGE'$ @  There were no vitals filed for this visit.   Physical Exam Sounded normal on the phone     Assessment:       ICD-10-CM   1. Dyspnea on exertion  R06.00   2. History of anemia due to CKD  N18.9    Z86.2   3. At risk from passive smoking  Z91.89   4. Pulmonary emphysema, unspecified emphysema type ([place])  J43.9   5. Chronic left ventricular systolic dysfunction  J50.0   6. ILD (interstitial lung disease) ([place])  J84.9        Plan:     Patient Instructions  Dyspnea on exertion -Shortness of breath is from multiple reasons including all the reasons below.  The new issues diagnosed are worsening anemia and the presence of possible interstitial lung disease and associated emphysema  Chronic left ventricular systolic dysfunction   -At this current time this appears to be stable  Plan -Follow-up with your cardiologist  History of anemia due to CKD  -This appears to be getting worse despite the fact you are on iron tablets  Plan  -Follow with primary care physician; he might need other anemia work-up for anemia management  At risk from passive smoking Pulmonary emphysema, unspecified emphysema type ([place])  -You seemed to have pulmonary emphysema on the CT scan of the chest.  Is probably from passive smoking  Plan -It appears that Anoro is not working for you.  Stop this -Start Spiriva Respimat once daily with albuterol as  needed  -Given the multiple edges for your shortness of breath it is possible that this only gives you minimal relief but we will have to see    ILD (interstitial lung disease) ([place])  -You might have this according to the radiologist.  If so it has implications of being progressive  -  A biopsy might be indicated to sort out if it is just scar from previous pneumonia or actual the disease of pulmonary fibrosis.  However, a surgical lung biopsy can be risky given your medical problems.  Therefore we can start with minimal risk work-up  Plan  - Serum: ESR, ACE, ANA, DS-DNA, RF, anti-CCP,  ANCA screen, MPO, PR-3, Total CK,  Aldolase,   -> scl-70, ssA, ssB, anti-RNP, anti-JO-1, Hypersensitivity Pneumonitis Panel   Followup 2-4 weeks - telephone visit to discuss results and course with spoiriva   ( Level 03: Esbt 20-29 min  telephone  in total care time and counseling or/and coordination of care by this undersigned MD - Dr Brand Males. This includes one or more of the following all delivered on this same day 02/21/2019: pre-charting, chart review, note writing, documentation discussion of test results, diagnostic or treatment recommendations, prognosis, risks and benefits of management options, instructions, education, compliance or risk-factor reduction. It excludes time spent by the [place] or office staff in the care of the patient. Actual time was 26 min)   SIGNATURE    Dr. Brand Males, M.D., F.C.C.P,  Pulmonary and Critical Care Medicine Staff Physician, [facility] Director - Interstitial Lung Disease  Program  Pulmonary Fibrosis  [facility] [facility] [place], [place], [address]  Pager: [phone], If no answer or between  15:00h - 7:00h: call [phone] Telephone: [phone]  10:02 AM 02/21/2019

## 2019-02-21 NOTE — Patient Instructions (Addendum)
Dyspnea on exertion -Shortness of breath is from multiple reasons including all the reasons below.  The new issues diagnosed are worsening anemia and the presence of possible interstitial lung disease and associated emphysema  Chronic left ventricular systolic dysfunction   -At this current time this appears to be stable  Plan -Follow-up with your cardiologist  History of anemia due to CKD  -This appears to be getting worse despite the fact you are on iron tablets  Plan  -Follow with primary care physician; he might need other anemia work-up for anemia management  At risk from passive smoking Pulmonary emphysema, unspecified emphysema type ([place])  -You seemed to have pulmonary emphysema on the CT scan of the chest.  Is probably from passive smoking  Plan -It appears that Anoro is not working for you.  Stop this -Start Spiriva Respimat once daily with albuterol as needed  -Given the multiple edges for your shortness of breath it is possible that this only gives you minimal relief but we will have to see    ILD (interstitial lung disease) ([place])  -You might have this according to the radiologist.  If so it has implications of being progressive  -  A biopsy might be indicated to sort out if it is just scar from previous pneumonia or actual the disease of pulmonary fibrosis.  However, a surgical lung biopsy can be risky given your medical problems.  Therefore we can start with minimal risk work-up  Plan  - Serum: ESR, ACE, ANA, DS-DNA, RF, anti-CCP,  ANCA screen, MPO, PR-3, Total CK,  Aldolase,   -> scl-70, ssA, ssB, anti-RNP, anti-JO-1, Hypersensitivity Pneumonitis Panel   Followup 2-4 weeks - telephone visit to discuss results and course with spoiriva

## 2019-02-24 ENCOUNTER — Ambulatory Visit: Payer: PRIVATE HEALTH INSURANCE | Admitting: Cardiology

## 2019-02-24 ENCOUNTER — Telehealth: Payer: Self-pay | Admitting: Internal Medicine

## 2019-02-24 NOTE — Telephone Encounter (Signed)
Lm for pt

## 2019-02-24 NOTE — Addendum Note (Signed)
Addended by: Maryanna Shape A on: 02/24/2019 09:49 AM   Modules accepted: Orders

## 2019-02-24 NOTE — Telephone Encounter (Signed)
Lm to make pt aware of need for labs and to schedule 2-4wk rov with MR. AVS mentions switching pt to spiriva, however dosage is not mentioned.   MR, please advise on dosage of spiriva? Thanks.

## 2019-02-24 NOTE — Telephone Encounter (Signed)
spiriva 1.25 dosage fine, 2 puff once daily. Respimat. Give him a good 4 week sample or script

## 2019-02-24 NOTE — Telephone Encounter (Signed)
Patient called back - advised to come in to do labs prior to appt with MR - scheduled f/u in [place] on 03/21/2019 - pt checking on inhaler - advised as soon as we have a response from MR- he will be contacted - pr

## 2019-02-25 MED ORDER — SPIRIVA RESPIMAT 1.25 MCG/ACT IN AERS: 2.0000 | INHALATION_SPRAY | Freq: Every day | RESPIRATORY_TRACT | 0 refills | Status: DC

## 2019-02-25 NOTE — Telephone Encounter (Signed)
Rx for Spiriva 1.25 has been sent to preferred pharmacy. Pt is aware and voiced his understanding.  Pt requested that blood be drawn here and sent to dialysis to be resulted. I advised pt that I did not think blood could be sent to dialysis center. Pt stated that he would come by the medical mall to have labs drawn.   Nothing further is needed.

## 2019-03-03 ENCOUNTER — Telehealth: Payer: Self-pay | Admitting: Cardiology

## 2019-03-03 ENCOUNTER — Ambulatory Visit (INDEPENDENT_AMBULATORY_CARE_PROVIDER_SITE_OTHER): Payer: PRIVATE HEALTH INSURANCE | Admitting: Cardiology

## 2019-03-03 ENCOUNTER — Encounter: Payer: Self-pay | Admitting: Cardiology

## 2019-03-03 ENCOUNTER — Other Ambulatory Visit: Payer: Self-pay

## 2019-03-03 VITALS — BP 170/94 | HR 79 | Ht 74.0 in | Wt 187.2 lb

## 2019-03-03 DIAGNOSIS — I10 Essential (primary) hypertension: Secondary | ICD-10-CM | POA: Diagnosis not present

## 2019-03-03 DIAGNOSIS — I5020 Unspecified systolic (congestive) heart failure: Secondary | ICD-10-CM

## 2019-03-03 MED ORDER — ISOSORBIDE MONONITRATE ER 60 MG PO TB24: 60.0000 mg | ORAL_TABLET | Freq: Every day | ORAL | 3 refills | Status: DC

## 2019-03-03 MED ORDER — CLONIDINE 0.2 MG/24HR TD PTWK: 0.2000 mg | MEDICATED_PATCH | TRANSDERMAL | 12 refills | Status: DC

## 2019-03-03 NOTE — Telephone Encounter (Signed)
Called patient back. Patient woke up this morning with elevated BP. 0400 am 224/114. Patient took amlodipine, carvedilol, spironolactone and hydralazine a little after 4 am. He is wearing the clonidine patch as well.  Retook NBP at 8am 195/117, HR 87. Had one sharp pain in his fistula in his left arm but that went away. Complains of headache and states "feels like my heart is beating out of my chest." Denies chest pain, blurred vision, SOB, dizziness, nausea, vomiting or sweating.  Says his BP has been trending upward the last few weeks. He was supposed to f/u with Dr. Garen Lah last Friday but had to cancel the appointment. BP while on the phone was 199/116, HR 88. Placed patient in appointment with Dr. Garen Lah this morning at 10:20am. Advised patient I would make sure this is advised by the doctor and call him back if patient needs to go on to the Emergency room. Patient will be arranging a ride and plan to come to office for appointment unless he hears from me otherwise.

## 2019-03-03 NOTE — Telephone Encounter (Signed)
Pt c/o BP issue: STAT if pt c/o blurred vision, one-sided weakness or slurred speech  1. What are your last 5 BP readings?  224/117 at 4 am  195/117 8 am (after morning meds)  2. Are you having any other symptoms (ex. Dizziness, headache, blurred vision, passed out)? Headache, feel heart beating like it is going to jump out of chest  3. What is your BP issue? Very high, unsure if it is related to medication

## 2019-03-03 NOTE — Progress Notes (Signed)
Cardiology Office Note:    Date:  03/03/2019   ID:  Louis [name], DOB 1960-03-16, MRN [id number]  PCP:  Steele Sizer, MD  Cardiologist:  No primary care provider on file.  Electrophysiologist:  None   Referring MD: Steele Sizer, MD   Chief Complaint  Patient presents with  . office visit    Pt c/o heart pounding, pain in arm fistula could relate to chest pain. Meds verbally reviewed w/ pt.    History of Present Illness:    Louis [name] is a 59 y.o. male with a hx of hypertension, end-stage renal disease on hemodialysis who presents for follow-up.  He was last seen due to evaluation of his hypertension.    He has a long history of hypertension, with difficulty controlling blood pressures over the past couple of months.  He takes Lasix, Norvasc, atenolol, hydralazine 100 mg 3 times daily and clonidine patch 0.1 mg every week losartan was started but stopped after patient developed a reaction to this with lip swelling.  Echocardiogram 12/2018 showed mildly reduced ejection fraction EF 45 to 50% and grade 2 diastolic dysfunction.  Myocardial perfusion imaging stress test with no evidence for ischemia.  Past Medical History:  Diagnosis Date  . Chronic kidney disease    esrd  . Dermatophytosis of foot 06/23/2004  . Environmental allergies   . Gout   . HIV infection ([place]) 12/1995  . Hypertension   . Seborrhea 06/23/2004    Past Surgical History:  Procedure Laterality Date  . CAPD INSERTION N/A 12/28/2016   Procedure: LAPAROSCOPIC INSERTION CONTINUOUS AMBULATORY PERITONEAL DIALYSIS  (CAPD) CATHETER;  Surgeon: Algernon Huxley, MD;  Location: [facility];  Service: Vascular;  Laterality: N/A;  . DIALYSIS/PERMA CATHETER INSERTION N/A 10/09/2016   Procedure: DIALYSIS/PERMA CATHETER INSERTION;  Surgeon: Algernon Huxley, MD;  Location: [facility];  Service: Cardiovascular;  Laterality: N/A;  . DIALYSIS/PERMA CATHETER REMOVAL N/A 02/19/2017   Procedure: DIALYSIS/PERMA  CATHETER REMOVAL;  Surgeon: Algernon Huxley, MD;  Location: [facility];  Service: Cardiovascular;  Laterality: N/A;  . HERNIA REPAIR Bilateral    inguinal    Current Medications: Current Meds  Medication Sig  . amLODipine (NORVASC) 10 MG tablet Take 1 tablet (10 mg total) by mouth daily.  Jearl Klinefelter ELLIPTA 62.5-25 MCG/INH AEPB TAKE 1 PUFF BY MOUTH EVERY DAY  . AURYXIA 1 GM 210 MG(Fe) tablet Take 1 tablet by mouth 4 (four) times daily -  before meals and at bedtime.  . B Complex-C-Folic Acid (RENA-VITE RX) 1 MG TABS Take 1 tablet by mouth daily.  . carvedilol (COREG) 25 MG tablet Take 1 tablet (25 mg total) by mouth 2 (two) times daily.  . cetirizine (ZYRTEC) 10 MG tablet Take 1 tablet (10 mg total) by mouth daily.  . DESCOVY 200-25 MG tablet Take 1 tablet by mouth daily.  . diclofenac sodium (VOLTAREN) 1 % GEL Apply 2 g topically 4 (four) times daily.  . Febuxostat 80 MG TABS Take 1 tablet by mouth daily.  . ferrous sulfate 325 (65 FE) MG tablet Take 1 tablet by mouth daily.  . furosemide (LASIX) 40 MG tablet Take 80 mg by mouth daily.   . halobetasol (ULTRAVATE) 0.05 % cream APPLY TO AFFECTED AREA TWICE A DAY  . hydrALAZINE (APRESOLINE) 100 MG tablet Take 100 mg by mouth 3 (three) times daily.  . methocarbamol (ROBAXIN) 500 MG tablet Take 500 mg by mouth 2 (two) times daily as needed. for pain  .  predniSONE (DELTASONE) 10 MG tablet Take 1 tablet (10 mg total) by mouth 2 (two) times daily as needed. Prn gout attack for 2 days  . spironolactone (ALDACTONE) 25 MG tablet Take 1 tablet (25 mg total) by mouth daily.  . Tiotropium Bromide Monohydrate (SPIRIVA RESPIMAT) 1.25 MCG/ACT AERS Inhale 2 puffs into the lungs daily.  Marland Kitchen TIVICAY 50 MG tablet Take 1 tablet by mouth daily.  . valACYclovir (VALTREX) 500 MG tablet Take 500 mg by mouth daily as needed (for flare ups).   . [DISCONTINUED] cloNIDine (CATAPRES - DOSED IN MG/24 HR) 0.1 mg/24hr patch Place 0.1 mg onto the skin once a week.       Allergies:   Losartan and Quinapril hcl   Social History   Socioeconomic History  . Marital status: Single    Spouse name: Not on file  . Number of children: 0  . Years of education: Not on file  . Highest education level: Associate degree: occupational, Hotel manager, or vocational program  Occupational History  . Not on file  Tobacco Use  . Smoking status: Never Smoker  . Smokeless tobacco: Never Used  Substance and Sexual Activity  . Alcohol use: Not Currently    Alcohol/week: 2.0 standard drinks    Types: 2 Standard drinks or equivalent per week    Comment: has not had any alcohol in a while  . Drug use: No  . Sexual activity: Yes    Partners: Female    Birth control/protection: Condom  Other Topics Concern  . Not on file  Social History Narrative  . Not on file   Social Determinants of Health   Financial Resource Strain:   . Difficulty of Paying Living Expenses: Not on file  Food Insecurity:   . Worried About Charity fundraiser in the Last Year: Not on file  . Ran Out of Food in the Last Year: Not on file  Transportation Needs:   . Lack of Transportation (Medical): Not on file  . Lack of Transportation (Non-Medical): Not on file  Physical Activity:   . Days of Exercise per Week: Not on file  . Minutes of Exercise per Session: Not on file  Stress:   . Feeling of Stress : Not on file  Social Connections:   . Frequency of Communication with Friends and Family: Not on file  . Frequency of Social Gatherings with Friends and Family: Not on file  . Attends Religious Services: Not on file  . Active Member of Clubs or Organizations: Not on file  . Attends Archivist Meetings: Not on file  . Marital Status: Not on file     Family History: The patient's family history includes Bradycardia in his maternal grandmother.  ROS:   Please see the history of present illness.     All other systems reviewed and are negative.  EKGs/Labs/Other Studies Reviewed:     The following studies were reviewed today: TTE 2018-12-25 1. Significant LV and RV hypertrophy, concern for regions of noncompaction, consider additional imaging if clinically indicated.  2. Left ventricular ejection fraction, by visual estimation, is 45 to 50%. The left ventricle has mildly decreased function. There is mildly increased left ventricular hypertrophy in the basal regions, with moderate LVH in the mid to distal regions,  severe LVH in the inferior/lateral apical regions. Unable to exclude regions on noncompaction.  3. Mildly dilated left ventricular internal cavity size.  4. Left ventricular diastolic parameters are consistent with Grade II diastolic dysfunction (pseudonormalization).  5. Global right ventricle has normal systolic function.The right ventricular size is normal. Moderately increased right ventricular wall thickness.  6. Left atrial size was moderately dilated.  7. There is mild dilatation of the aortic root measuring 39 mm.  8. Severely elevated pulmonary artery systolic pressure.  9. The inferior vena cava is dilated in size with <50% respiratory variability, suggesting right atrial pressure of 20 mmHg. 10. The tricuspid regurgitant velocity is 3.39 m/s, and with an assumed right atrial pressure of 20 mmHg, the estimated right ventricular systolic pressure is severely elevated at 66.0 mmHg.    Summary: Largest Aortic Diameter: 2.1 cm   Renal:   Right: Normal size right kidney. Abnormal right Resistive Index.        Abnormal cortical thickness of right kidney. No evidence of        right renal artery stenosis. RRV flow present. Cyst(s) noted.        Echogenic hyperechoic kidney parenchyma. There is absent        diastolic flow present throughout the segmental Dopplers. Left:  Normal size of left kidney. Abnormal left Resisitve Index.        Abnormal cortical thickness of the left kidney. No evidence        of left renal artery stenosis. LRV flow present.  Cyst(s)        noted. Echogenic hyperechoic kidney parenchyma. There is        absent diastolic flow present throughout segmental Dopplers.   EKG:  EKG is  ordered today.  The ekg ordered today demonstrates normal sinus rhythm, normal ECG.  Recent Labs: 05/24/2018: ALT 15; B Natriuretic Peptide 1,499.0; BUN 70; Creatinine, Ser 18.08; Potassium 4.5; Sodium 140 01/31/2019: Hemoglobin 8.8; Platelets 158  Recent Lipid Panel    Component Value Date/Time   CHOL 179 08/09/2011 1545   TRIG 89 08/09/2011 1545   HDL 54 08/09/2011 1545   CHOLHDL 3.3 08/09/2011 1545   VLDL 18 08/09/2011 1545   LDLCALC 107 (H) 08/09/2011 1545    Physical Exam:    VS:  BP (!) 170/94 (BP Location: Right Arm, Patient Position: Sitting, Cuff Size: Normal)   Pulse 79   Ht 6\' 2"  (1.88 m)   Wt 187 lb 4 oz (84.9 kg)   SpO2 98%   BMI 24.04 kg/m     Wt Readings from Last 3 Encounters:  03/03/19 187 lb 4 oz (84.9 kg)  01/31/19 190 lb (86.2 kg)  01/02/19 183 lb 12 oz (83.3 kg)     GEN:  Well nourished, well developed in no acute distress HEENT: Normal NECK: No JVD; No carotid bruits LYMPHATICS: No lymphadenopathy CARDIAC: RRR, no murmurs, rubs, gallops, AV fistula bruit auscultated. RESPIRATORY:  Clear to auscultation without rales, wheezing or rhonchi  ABDOMEN: Soft, non-tender, non-distended MUSCULOSKELETAL:  No edema; No deformity  SKIN: Warm and dry NEUROLOGIC:  Alert and oriented x 3 PSYCHIATRIC:  Normal affect   ASSESSMENT:    1. Essential hypertension   2. Heart failure with reduced ejection fraction (HCC)    PLAN:     1. Difficult to control blood pressures.  BP still elevated at 170/94.  Renal artery ultrasound did not show evidence for stenosis.  Chronic kidney disease possibly contributing.  Start Imdur 60 mg daily.  Increase clonidine patch 0.2 mg/24hr patch weekly.  Continue Coreg 25 mg twice daily, hydralazine 100 mg 3 times daily, amlodipine 10 mg daily.  Lasix 80 mg daily. 2. Mildly  reduced ejection fraction, EF 45  to 50%.  MPI with no evidence for ischemia.  Start Imdur 60 mg daily continue Coreg 25 mg twice daily, hydralazine 100 mg 3 times daily, Lasix 80 daily.   Follow-up in 2 weeks  Total encounter time more than 40 minutes  Greater than 50% was spent in counseling and coordination of care with the patient  This note was generated in part or whole with voice recognition software. Voice recognition is usually quite accurate but there are transcription errors that can and very often do occur. I apologize for any typographical errors that were not detected and corrected.   Medication Adjustments/Labs and Tests Ordered: Current medicines are reviewed at length with the patient today.  Concerns regarding medicines are outlined above.  Orders Placed This Encounter  Procedures  . EKG 12-Lead   Meds ordered this encounter  Medications  . cloNIDine (CATAPRES - DOSED IN MG/24 HR) 0.2 mg/24hr patch    Sig: Place 1 patch (0.2 mg total) onto the skin once a week.    Dispense:  4 patch    Refill:  12  . isosorbide mononitrate (IMDUR) 60 MG 24 hr tablet    Sig: Take 1 tablet (60 mg total) by mouth daily.    Dispense:  30 tablet    Refill:  3    Patient Instructions  Medication Instructions:   Your physician has recommended you make the following change in your medication: You Clonidine has been increased to 0.2mg  patch. Stop using the 0.1mg  patch. Start Imdur 60 mg daily.  *If you need a refill on your cardiac medications before your next appointment, please call your pharmacy*  Lab Work: None ordered If you have labs (blood work) drawn today and your tests are completely normal, you will receive your results only by: Marland Kitchen MyChart Message (if you have MyChart) OR . A paper copy in the mail If you have any lab test that is abnormal or we need to change your treatment, we will call you to review the results.  Testing/Procedures: None ordered  Follow-Up: At [facility], you and your health needs are our priority.  As part of our continuing mission to provide you with exceptional heart care, we have created designated Provider Care Teams.  These Care Teams include your primary Cardiologist (physician) and Advanced Practice Providers (APPs -  Physician Assistants and Nurse Practitioners) who all work together to provide you with the care you need, when you need it.  Your next appointment:   2 week(s)  The format for your next appointment:   In Person  Provider:   Kate Sable, MD  Other Instructions  Blood Pressure Record Sheet To take your blood pressure, you will need a blood pressure machine. You can buy a blood pressure machine (blood pressure monitor) at your clinic, drug store, or online. When choosing one, consider:  An automatic monitor that has an arm cuff.  A cuff that wraps snugly around your upper arm. You should be able to fit only one finger between your arm and the cuff.  A device that stores blood pressure reading results.  Do not choose a monitor that measures your blood pressure from your wrist or finger. Follow your health care provider's instructions for how to take your blood pressure. To use this form:  Get one reading in the morning (a.m.) before you take any medicines.  Get one reading in the evening (p.m.) before supper.  Take at least 2 readings with each blood pressure check. This makes  sure the results are correct. Wait 1-2 minutes between measurements.  Write down the results in the spaces on this form.  Repeat this once a week, or as told by your health care provider.  Make a follow-up appointment with your health care provider to discuss the results. Blood pressure log Date: _______________________  a.m. _____________________(1st reading) _____________________(2nd reading)  p.m. _____________________(1st reading) _____________________(2nd reading) Date: _______________________  a.m.  _____________________(1st reading) _____________________(2nd reading)  p.m. _____________________(1st reading) _____________________(2nd reading) Date: _______________________  a.m. _____________________(1st reading) _____________________(2nd reading)  p.m. _____________________(1st reading) _____________________(2nd reading) Date: _______________________  a.m. _____________________(1st reading) _____________________(2nd reading)  p.m. _____________________(1st reading) _____________________(2nd reading) Date: _______________________  a.m. _____________________(1st reading) _____________________(2nd reading)  p.m. _____________________(1st reading) _____________________(2nd reading) This information is not intended to replace advice given to you by your health care provider. Make sure you discuss any questions you have with your health care provider. Document Revised: 03/30/2017 Document Reviewed: 01/30/2017 Elsevier Patient Education  2020 [place], Kate Sable, MD  03/03/2019 11:03 AM    [place]

## 2019-03-03 NOTE — Telephone Encounter (Signed)
Discussed with Dr. Garen Lah and it is ok for patient to keep appointment for this morning.

## 2019-03-03 NOTE — Patient Instructions (Signed)
Medication Instructions:   Your physician has recommended you make the following change in your medication: You Clonidine has been increased to 0.2mg  patch. Stop using the 0.1mg  patch. Start Imdur 60 mg daily.  *If you need a refill on your cardiac medications before your next appointment, please call your pharmacy*  Lab Work: None ordered If you have labs (blood work) drawn today and your tests are completely normal, you will receive your results only by: Marland Kitchen MyChart Message (if you have MyChart) OR . A paper copy in the mail If you have any lab test that is abnormal or we need to change your treatment, we will call you to review the results.  Testing/Procedures: None ordered  Follow-Up: At [facility], you and your health needs are our priority.  As part of our continuing mission to provide you with exceptional heart care, we have created designated Provider Care Teams.  These Care Teams include your primary Cardiologist (physician) and Advanced Practice Providers (APPs -  Physician Assistants and Nurse Practitioners) who all work together to provide you with the care you need, when you need it.  Your next appointment:   2 week(s)  The format for your next appointment:   In Person  Provider:   Kate Sable, MD  Other Instructions  Blood Pressure Record Sheet To take your blood pressure, you will need a blood pressure machine. You can buy a blood pressure machine (blood pressure monitor) at your clinic, drug store, or online. When choosing one, consider:  An automatic monitor that has an arm cuff.  A cuff that wraps snugly around your upper arm. You should be able to fit only one finger between your arm and the cuff.  A device that stores blood pressure reading results.  Do not choose a monitor that measures your blood pressure from your wrist or finger. Follow your health care provider's instructions for how to take your blood pressure. To use this form:  Get one  reading in the morning (a.m.) before you take any medicines.  Get one reading in the evening (p.m.) before supper.  Take at least 2 readings with each blood pressure check. This makes sure the results are correct. Wait 1-2 minutes between measurements.  Write down the results in the spaces on this form.  Repeat this once a week, or as told by your health care provider.  Make a follow-up appointment with your health care provider to discuss the results. Blood pressure log Date: _______________________  a.m. _____________________(1st reading) _____________________(2nd reading)  p.m. _____________________(1st reading) _____________________(2nd reading) Date: _______________________  a.m. _____________________(1st reading) _____________________(2nd reading)  p.m. _____________________(1st reading) _____________________(2nd reading) Date: _______________________  a.m. _____________________(1st reading) _____________________(2nd reading)  p.m. _____________________(1st reading) _____________________(2nd reading) Date: _______________________  a.m. _____________________(1st reading) _____________________(2nd reading)  p.m. _____________________(1st reading) _____________________(2nd reading) Date: _______________________  a.m. _____________________(1st reading) _____________________(2nd reading)  p.m. _____________________(1st reading) _____________________(2nd reading) This information is not intended to replace advice given to you by your health care provider. Make sure you discuss any questions you have with your health care provider. Document Revised: 03/30/2017 Document Reviewed: 01/30/2017 Elsevier Patient Education  [place].

## 2019-03-04 MED ORDER — DOXAZOSIN MESYLATE 2 MG PO TABS: 2.0000 mg | ORAL_TABLET | Freq: Two times a day (BID) | ORAL | 2 refills | Status: DC

## 2019-03-04 NOTE — Telephone Encounter (Signed)
Returned call to patient. States he increased his clonidine patch to 0.2 mg and also started Imdur 60 mg yesterday after office visit.  He took some motrin and it only relieved the headache for an hour or so. Still has bad headache. Denies chest pain or shortness of breath. Complains that this started on Friday when he was at dialysis.  The doctor at dialysis said his BP was elevated and that he should call his cardiologist.  He feels that his heart is beating out of his chest at times when he goes to stand up and he feels like it takes him a long time to focus. I inquired of him what this meant, and he could not describe it completely to me what he meant. 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 Today, says his BP is still elevated around 208/118.  Advised patient that I will make Dr Garen Lah aware and get back to him with further recommendations.

## 2019-03-04 NOTE — Addendum Note (Signed)
Addended by: Vanessa Ralphs on: 03/04/2019 03:21 PM   Modules accepted: Orders

## 2019-03-04 NOTE — Telephone Encounter (Signed)
Kate Sable, MD  You 57 minutes ago (2:18 PM)   Please stop Imdur due to headaches. Start Cardura 2 mg twice daily. Please schedule patient for a visit with our hypertensive clinic with Dr. Oval Linsey in [place]. Thank you   Routing comment     Patient called and verbalized understanding to stop Imdur and start Cardura 2 mg by mouth two times a day. Rx sent to pharmacy and med list updated.  Message sent to [facility] pool in [place] to call patient to schedule with Dr Oval Linsey at the hypertensive clinic.

## 2019-03-04 NOTE — Telephone Encounter (Signed)
Patient calling in stating his BP is still high and his head is still hurting. Patient states he took ibuprofen like instructed and there was no true relief, patient hasn't been able to sleep. Patient used Clonidine patch and took 60 mg isosorbide mononitrate and then bp was still 208/118.  Patient would like to be advised.

## 2019-03-05 ENCOUNTER — Other Ambulatory Visit: Payer: Self-pay

## 2019-03-05 ENCOUNTER — Emergency Department: Payer: PRIVATE HEALTH INSURANCE

## 2019-03-05 ENCOUNTER — Encounter: Payer: Self-pay | Admitting: Emergency Medicine

## 2019-03-05 ENCOUNTER — Observation Stay
Admission: EM | Admit: 2019-03-05 | Discharge: 2019-03-06 | Disposition: A | Discharge status: Home / Self Care | Payer: PRIVATE HEALTH INSURANCE | Attending: Internal Medicine | Admitting: Internal Medicine

## 2019-03-05 ENCOUNTER — Telehealth: Payer: Self-pay | Admitting: Cardiology

## 2019-03-05 DIAGNOSIS — M109 Gout, unspecified: Secondary | ICD-10-CM | POA: Insufficient documentation

## 2019-03-05 DIAGNOSIS — N186 End stage renal disease: Secondary | ICD-10-CM | POA: Diagnosis not present

## 2019-03-05 DIAGNOSIS — R0789 Other chest pain: Secondary | ICD-10-CM | POA: Insufficient documentation

## 2019-03-05 DIAGNOSIS — R9431 Abnormal electrocardiogram [ECG] [EKG]: Secondary | ICD-10-CM | POA: Diagnosis present

## 2019-03-05 DIAGNOSIS — M62838 Other muscle spasm: Secondary | ICD-10-CM | POA: Diagnosis present

## 2019-03-05 DIAGNOSIS — D631 Anemia in chronic kidney disease: Secondary | ICD-10-CM | POA: Insufficient documentation

## 2019-03-05 DIAGNOSIS — Z20822 Contact with and (suspected) exposure to covid-19: Secondary | ICD-10-CM | POA: Diagnosis not present

## 2019-03-05 DIAGNOSIS — Z992 Dependence on renal dialysis: Secondary | ICD-10-CM | POA: Diagnosis not present

## 2019-03-05 DIAGNOSIS — I160 Hypertensive urgency: Principal | ICD-10-CM | POA: Insufficient documentation

## 2019-03-05 DIAGNOSIS — B20 Human immunodeficiency virus [HIV] disease: Secondary | ICD-10-CM | POA: Insufficient documentation

## 2019-03-05 DIAGNOSIS — Z79899 Other long term (current) drug therapy: Secondary | ICD-10-CM | POA: Insufficient documentation

## 2019-03-05 DIAGNOSIS — I5032 Chronic diastolic (congestive) heart failure: Secondary | ICD-10-CM | POA: Diagnosis not present

## 2019-03-05 DIAGNOSIS — E441 Mild protein-calorie malnutrition: Secondary | ICD-10-CM | POA: Diagnosis not present

## 2019-03-05 DIAGNOSIS — N189 Chronic kidney disease, unspecified: Secondary | ICD-10-CM

## 2019-03-05 DIAGNOSIS — Z6824 Body mass index (BMI) 24.0-24.9, adult: Secondary | ICD-10-CM | POA: Insufficient documentation

## 2019-03-05 DIAGNOSIS — M1A00X0 Idiopathic chronic gout, unspecified site, without tophus (tophi): Secondary | ICD-10-CM | POA: Diagnosis present

## 2019-03-05 DIAGNOSIS — I132 Hypertensive heart and chronic kidney disease with heart failure and with stage 5 chronic kidney disease, or end stage renal disease: Secondary | ICD-10-CM | POA: Diagnosis not present

## 2019-03-05 DIAGNOSIS — Z862 Personal history of diseases of the blood and blood-forming organs and certain disorders involving the immune mechanism: Secondary | ICD-10-CM

## 2019-03-05 DIAGNOSIS — E875 Hyperkalemia: Secondary | ICD-10-CM | POA: Diagnosis not present

## 2019-03-05 DIAGNOSIS — L719 Rosacea, unspecified: Secondary | ICD-10-CM | POA: Diagnosis present

## 2019-03-05 LAB — BASIC METABOLIC PANEL
Anion gap: 16 — ABNORMAL HIGH (ref 5–15)
Anion gap: 18 — ABNORMAL HIGH (ref 5–15)
BUN: 99 mg/dL — ABNORMAL HIGH (ref 6–20)
BUN: 98 mg/dL — ABNORMAL HIGH (ref 6–20)
CO2: 23 mmol/L (ref 22–32)
CO2: 20 mmol/L — ABNORMAL LOW (ref 22–32)
Calcium: 9.4 mg/dL (ref 8.9–10.3)
Calcium: 9.6 mg/dL (ref 8.9–10.3)
Chloride: 103 mmol/L (ref 98–111)
Chloride: 103 mmol/L (ref 98–111)
Creatinine, Ser: 20.01 mg/dL — ABNORMAL HIGH (ref 0.61–1.24)
Creatinine, Ser: 19.85 mg/dL — ABNORMAL HIGH (ref 0.61–1.24)
GFR calc Af Amer: 3 mL/min — ABNORMAL LOW (ref >60)
GFR calc Af Amer: 3 mL/min — ABNORMAL LOW (ref >60)
GFR calc non Af Amer: 2 mL/min — ABNORMAL LOW (ref >60)
GFR calc non Af Amer: 2 mL/min — ABNORMAL LOW (ref >60)
Glucose, Bld: 91 mg/dL (ref 70–99)
Glucose, Bld: 91 mg/dL (ref 70–99)
Potassium: 6.2 mmol/L — ABNORMAL HIGH (ref 3.5–5.1)
Potassium: 6.4 mmol/L (ref 3.5–5.1)
Sodium: 142 mmol/L (ref 135–145)
Sodium: 141 mmol/L (ref 135–145)

## 2019-03-05 LAB — CBC
HCT: 30 % — ABNORMAL LOW (ref 39.0–52.0)
Hemoglobin: 9.6 g/dL — ABNORMAL LOW (ref 13.0–17.0)
MCH: 33.7 pg (ref 26.0–34.0)
MCHC: 32 g/dL (ref 30.0–36.0)
MCV: 105.3 fL — ABNORMAL HIGH (ref 80.0–100.0)
Platelets: 134 10*3/uL — ABNORMAL LOW (ref 150–400)
RBC: 2.85 MIL/uL — ABNORMAL LOW (ref 4.22–5.81)
RDW: 16.8 % — ABNORMAL HIGH (ref 11.5–15.5)
WBC: 5.3 10*3/uL (ref 4.0–10.5)
nRBC: 0 % (ref 0.0–0.2)

## 2019-03-05 LAB — TROPONIN I (HIGH SENSITIVITY)
Troponin I (High Sensitivity): 52 ng/L — ABNORMAL HIGH (ref <18)
Troponin I (High Sensitivity): 50 ng/L — ABNORMAL HIGH (ref <18)

## 2019-03-05 LAB — RESPIRATORY PANEL BY RT PCR (FLU A&B, COVID)
Influenza A by PCR: NEGATIVE
Influenza B by PCR: NEGATIVE
SARS Coronavirus 2 by RT PCR: NEGATIVE

## 2019-03-05 MED ORDER — ACETAMINOPHEN 325 MG PO TABS: 650.0000 mg | ORAL_TABLET | Freq: Four times a day (QID) | ORAL | Status: DC | PRN

## 2019-03-05 MED ORDER — METHOCARBAMOL 500 MG PO TABS
500.0000 mg | ORAL_TABLET | Freq: Three times a day (TID) | ORAL | Status: DC | PRN
  Filled 2019-03-05: qty 1

## 2019-03-05 MED ORDER — HYDRALAZINE HCL 20 MG/ML IJ SOLN
10.0000 mg | INTRAMUSCULAR | Status: DC | PRN
  Administered 2019-03-05: 10 mg via INTRAVENOUS
  Filled 2019-03-05: qty 1

## 2019-03-05 MED ORDER — AMLODIPINE BESYLATE 10 MG PO TABS: 10.0000 mg | ORAL_TABLET | Freq: Every day | ORAL | Status: DC

## 2019-03-05 MED ORDER — LORATADINE 10 MG PO TABS
10.0000 mg | ORAL_TABLET | Freq: Every day | ORAL | Status: DC
  Administered 2019-03-06: 10 mg via ORAL
  Filled 2019-03-05 (×2): qty 1

## 2019-03-05 MED ORDER — HEPARIN SODIUM (PORCINE) 5000 UNIT/ML IJ SOLN
5000.0000 [IU] | Freq: Three times a day (TID) | INTRAMUSCULAR | Status: DC
  Administered 2019-03-05 – 2019-03-06 (×2): 5000 [IU] via SUBCUTANEOUS
  Filled 2019-03-05 (×2): qty 1

## 2019-03-05 MED ORDER — HALOBETASOL PROPIONATE 0.05 % EX CREA: TOPICAL_CREAM | Freq: Two times a day (BID) | CUTANEOUS | Status: DC

## 2019-03-05 MED ORDER — SODIUM CHLORIDE 0.9 % IV SOLN: 250.0000 mL | INTRAVENOUS | Status: DC | PRN

## 2019-03-05 MED ORDER — DOLUTEGRAVIR SODIUM 50 MG PO TABS
50.0000 mg | ORAL_TABLET | Freq: Every day | ORAL | Status: DC
  Administered 2019-03-06: 50 mg via ORAL
  Filled 2019-03-05: qty 1

## 2019-03-05 MED ORDER — CARVEDILOL 25 MG PO TABS
25.0000 mg | ORAL_TABLET | Freq: Two times a day (BID) | ORAL | Status: DC
  Administered 2019-03-05 – 2019-03-06 (×2): 25 mg via ORAL
  Filled 2019-03-05 (×3): qty 1

## 2019-03-05 MED ORDER — CALCIUM GLUCONATE-NACL 1-0.675 GM/50ML-% IV SOLN
1.0000 g | Freq: Once | INTRAVENOUS | Status: AC
  Administered 2019-03-05: 1000 mg via INTRAVENOUS
  Filled 2019-03-05: qty 50

## 2019-03-05 MED ORDER — EMTRICITABINE-TENOFOVIR AF 200-25 MG PO TABS
1.0000 | ORAL_TABLET | Freq: Every day | ORAL | Status: DC
  Administered 2019-03-06: 1 via ORAL
  Filled 2019-03-05: qty 1

## 2019-03-05 MED ORDER — FEBUXOSTAT 40 MG PO TABS
80.0000 mg | ORAL_TABLET | Freq: Every day | ORAL | Status: DC
  Administered 2019-03-06: 80 mg via ORAL
  Filled 2019-03-05: qty 2

## 2019-03-05 MED ORDER — LABETALOL HCL 5 MG/ML IV SOLN
10.0000 mg | Freq: Once | INTRAVENOUS | Status: AC
  Administered 2019-03-05: 10 mg via INTRAVENOUS
  Filled 2019-03-05: qty 4

## 2019-03-05 MED ORDER — LABETALOL HCL 5 MG/ML IV SOLN
10.0000 mg | INTRAVENOUS | Status: DC | PRN
  Administered 2019-03-05 (×2): 10 mg via INTRAVENOUS
  Filled 2019-03-05 (×3): qty 4

## 2019-03-05 MED ORDER — DOXAZOSIN MESYLATE 2 MG PO TABS
2.0000 mg | ORAL_TABLET | Freq: Two times a day (BID) | ORAL | Status: DC
  Administered 2019-03-06: 2 mg via ORAL
  Filled 2019-03-05 (×3): qty 1

## 2019-03-05 MED ORDER — ACETAMINOPHEN 650 MG RE SUPP: 650.0000 mg | Freq: Four times a day (QID) | RECTAL | Status: DC | PRN

## 2019-03-05 MED ORDER — SODIUM CHLORIDE 0.9% FLUSH
3.0000 mL | Freq: Two times a day (BID) | INTRAVENOUS | Status: DC
  Administered 2019-03-05 – 2019-03-06 (×2): 3 mL via INTRAVENOUS

## 2019-03-05 MED ORDER — FERRIC CITRATE 1 GM 210 MG(FE) PO TABS
210.0000 mg | ORAL_TABLET | Freq: Three times a day (TID) | ORAL | Status: DC
  Administered 2019-03-06 (×2): 210 mg via ORAL
  Filled 2019-03-05 (×4): qty 1

## 2019-03-05 MED ORDER — PATIROMER SORBITEX CALCIUM 8.4 G PO PACK
16.8000 g | PACK | Freq: Every day | ORAL | Status: DC
  Administered 2019-03-05: 16.8 g via ORAL
  Filled 2019-03-05 (×2): qty 2

## 2019-03-05 MED ORDER — FERROUS SULFATE 325 (65 FE) MG PO TABS
325.0000 mg | ORAL_TABLET | Freq: Every day | ORAL | Status: DC
  Administered 2019-03-06: 325 mg via ORAL
  Filled 2019-03-05: qty 1

## 2019-03-05 MED ORDER — VALACYCLOVIR HCL 500 MG PO TABS
500.0000 mg | ORAL_TABLET | Freq: Every day | ORAL | Status: DC | PRN
  Administered 2019-03-06: 500 mg via ORAL
  Filled 2019-03-05 (×2): qty 1

## 2019-03-05 MED ORDER — DEXTROSE 50 % IV SOLN
50.0000 mL | Freq: Once | INTRAVENOUS | Status: AC
  Administered 2019-03-05: 50 mL via INTRAVENOUS

## 2019-03-05 MED ORDER — TIOTROPIUM BROMIDE MONOHYDRATE 1.25 MCG/ACT IN AERS: 2.0000 | INHALATION_SPRAY | Freq: Every day | RESPIRATORY_TRACT | Status: DC

## 2019-03-05 MED ORDER — SODIUM CHLORIDE 0.9 % IV SOLN: 1.0000 g | Freq: Once | INTRAVENOUS | Status: DC

## 2019-03-05 MED ORDER — RENA-VITE PO TABS
1.0000 | ORAL_TABLET | Freq: Every day | ORAL | Status: DC
  Administered 2019-03-06: 1 via ORAL
  Filled 2019-03-05: qty 1

## 2019-03-05 MED ORDER — UMECLIDINIUM-VILANTEROL 62.5-25 MCG/INH IN AEPB
1.0000 | INHALATION_SPRAY | Freq: Every day | RESPIRATORY_TRACT | Status: DC
  Filled 2019-03-05: qty 14

## 2019-03-05 MED ORDER — SODIUM CHLORIDE 0.9% FLUSH
3.0000 mL | INTRAVENOUS | Status: DC | PRN
  Administered 2019-03-05: 3 mL via INTRAVENOUS

## 2019-03-05 MED ORDER — CLONIDINE HCL 0.2 MG/24HR TD PTWK: 0.2000 mg | MEDICATED_PATCH | TRANSDERMAL | Status: DC

## 2019-03-05 MED ORDER — INSULIN ASPART 100 UNIT/ML IV SOLN
10.0000 [IU] | Freq: Once | INTRAVENOUS | Status: AC
  Administered 2019-03-05: 10 [IU] via INTRAVENOUS
  Filled 2019-03-05: qty 0.1

## 2019-03-05 MED ORDER — FUROSEMIDE 40 MG PO TABS
80.0000 mg | ORAL_TABLET | Freq: Every day | ORAL | Status: DC
  Administered 2019-03-06: 80 mg via ORAL
  Filled 2019-03-05: qty 2

## 2019-03-05 NOTE — Progress Notes (Signed)
Patient reported to have $300 in his belongings, asked if he wants to lock it up in the security, per patient he wants it to keep it in his room. Educated patient about the risk of loosing his belongings, let him know that he changes his mind and want to lock up his valuable to let his nurse know.

## 2019-03-05 NOTE — Telephone Encounter (Signed)
Pt c/o medication issue:  1. Name of Medication: Cardura   2. How are you currently taking this medication (dosage and times per day)? 2 mg po BID started last night    3. Are you having a reaction (difficulty breathing--STAT)? Hallucinations chest tight difficulty breathing more so than before not too bad   4. What is your medication issue?  Reaction and bp still high

## 2019-03-05 NOTE — Telephone Encounter (Signed)
Called patient. States he took the first dose of Cardura 2mg  last night prior to bed. He woke up around 0300 am with hallucinations and that he "could feel whole body pumping with my heart rate." BP was 212/125, HR 79, no headache at that time.  This morning, I confirmed he took his amlodipine, carvedilol, hydralazine, furosemide and spironolactone as prescribed. At 0800 BP 180/103, hr 83. Has a slight headache on the right side. Says he has to move slowly otherwise the headache gets worse. He feels "off" and has to balance himself when he gets up. He hasn't worked the past few days (usually goes into work around 5 am). He has dialysis later today. Complains of no energy and falls asleep whenever sitting down.   Patient does not want to take any more of the Cardura. I checked with Rip Harbour, RN who is Dr Blenda Mounts nurse to see if patient could be seen this week.  No openings until 03/14/19, patient is currently scheduled.  Discussed with DOD, Dr End. Advised patient to continue medications at this time including taking Cardura this morning. Also, if patient develops chest pain and shortness of breath again, he should go to the ER.  Patient verbalized understanding of the recommendations; however, refused to take the Cardura again. He will pursue ER if symptoms return or worsen. Routing to Dr Garen Lah for review as well.

## 2019-03-05 NOTE — ED Notes (Signed)
Date and time results received: 03/05/19 10:00 PM  Test: potassium Critical Value: 6.4  Name of Provider Notified: Stark Klein, NP  Orders Received? Or Actions Taken?: Orders Received - See Orders for details

## 2019-03-05 NOTE — ED Provider Notes (Signed)
[facility] Emergency Department Provider Note  ____________________________________________   First MD Initiated Contact with Patient 03/05/19 1707     (approximate)  I have reviewed the triage vital signs and the nursing notes.  History  Chief Complaint Hypertension    HPI Louis [name] is a 59 y.o. male with hx HTN, ESRD on HD, diastolic dysfunction, mildly reduced EF, who presents for hypertension, chest discomfort, headache.  Patient's BP has been difficult to control recently, follows with [facility] who has been adjusting his medications, recently seen in office on 1/18.  Patient reports compliance with all of his medication, however his blood pressures have remained persistently high.  He states when his BP is well controlled, his systolic is typically in the 150s.  He reports some associated headache.  General in location, 6/10 in severity, described as an aching or throbbing.  No radiation, no alleviating or aggravating factors.  He also reports some associated vague, intermittent chest pain.  Difficult to describe, states it is like a pounding sensation.  No radiation, no alleviating/aggravating components.  Patient dialyzes MWF, last dialyzed last Friday.  States he missed Monday because he was at a teaching for home dialysis.  Did not dialyze today.   Past Medical Hx Past Medical History:  Diagnosis Date  . Chronic kidney disease    esrd  . Dermatophytosis of foot 06/23/2004  . Environmental allergies   . Gout   . HIV infection ([place]) 12/1995  . Hypertension   . Seborrhea 06/23/2004    Problem List Patient Active Problem List   Diagnosis Date Noted  . Prolonged Q-T interval on ECG 06/15/2018  . Mild protein-calorie malnutrition ([place]) 05/01/2018  . Eczema 01/04/2018  . History of anemia due to CKD 05/24/2017  . ESRD on dialysis ([place]) 12/19/2016  . 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 Baker's cyst of knee,  right 05/17/2016  . Neck muscle spasm 03/16/2016  . Hypertension 06/23/2010  . Rosacea 06/23/2010  . Human immunodeficiency virus (HIV) disease ([place]) 01/02/1996    Past Surgical Hx Past Surgical History:  Procedure Laterality Date  . CAPD INSERTION N/A 12/28/2016   Procedure: LAPAROSCOPIC INSERTION CONTINUOUS AMBULATORY PERITONEAL DIALYSIS  (CAPD) CATHETER;  Surgeon: Algernon Huxley, MD;  Location: [facility] ORS;  Service: Vascular;  Laterality: N/A;  . DIALYSIS/PERMA CATHETER INSERTION N/A 10/09/2016   Procedure: DIALYSIS/PERMA CATHETER INSERTION;  Surgeon: Algernon Huxley, MD;  Location: [place] [facility];  Service: Cardiovascular;  Laterality: N/A;  . DIALYSIS/PERMA CATHETER REMOVAL N/A 02/19/2017   Procedure: DIALYSIS/PERMA CATHETER REMOVAL;  Surgeon: Algernon Huxley, MD;  Location: [place] [facility];  Service: Cardiovascular;  Laterality: N/A;  . HERNIA REPAIR Bilateral    inguinal    Medications Prior to Admission medications   Medication Sig Start Date End Date Taking? Authorizing Provider  amLODipine (NORVASC) 10 MG tablet Take 1 tablet (10 mg total) by mouth daily. 08/28/16   Dustin Flock, MD  ANORO ELLIPTA 62.5-25 MCG/INH AEPB TAKE 1 PUFF BY MOUTH EVERY DAY 06/23/18   Steele Sizer, MD  AURYXIA 1 GM 210 MG(Fe) tablet Take 1 tablet by mouth 4 (four) times daily -  before meals and at bedtime. 04/18/18   [provider]  B Complex-C-Folic Acid (RENA-VITE RX) 1 MG TABS Take 1 tablet by mouth daily. 10/02/17   [provider]  carvedilol (COREG) 25 MG tablet Take 1 tablet (25 mg total) by mouth 2 (two) times daily. 01/02/19 04/02/19  Agbor-Etang,  Aaron Edelman, MD  cetirizine (ZYRTEC) 10 MG tablet Take 1 tablet (10 mg total) by mouth daily. 01/04/18   Steele Sizer, MD  cloNIDine (CATAPRES - DOSED IN MG/24 HR) 0.2 mg/24hr patch Place 1 patch (0.2 mg total) onto the skin once a week. 03/03/19   Kate Sable, MD  DESCOVY 200-25 MG tablet Take 1 tablet by mouth daily. 04/22/17    [provider]  diclofenac sodium (VOLTAREN) 1 % GEL Apply 2 g topically 4 (four) times daily. 09/05/18   Steele Sizer, MD  doxazosin (CARDURA) 2 MG tablet Take 1 tablet (2 mg total) by mouth 2 (two) times daily. 03/04/19   Kate Sable, MD  Febuxostat 80 MG TABS Take 1 tablet by mouth daily. 05/13/18   [provider]  ferrous sulfate 325 (65 FE) MG tablet Take 1 tablet by mouth daily.    [provider]  furosemide (LASIX) 40 MG tablet Take 80 mg by mouth daily.  12/06/16   [provider]  halobetasol (ULTRAVATE) 0.05 % cream APPLY TO AFFECTED AREA TWICE A DAY 08/21/18   [provider]  hydrALAZINE (APRESOLINE) 100 MG tablet Take 100 mg by mouth 3 (three) times daily. 10/28/18   [provider]  methocarbamol (ROBAXIN) 500 MG tablet Take 500 mg by mouth 2 (two) times daily as needed. for pain 04/23/18   [provider]  predniSONE (DELTASONE) 10 MG tablet Take 1 tablet (10 mg total) by mouth 2 (two) times daily as needed. Prn gout attack for 2 days 12/17/18   Steele Sizer, MD  spironolactone (ALDACTONE) 25 MG tablet Take 1 tablet (25 mg total) by mouth daily. 01/03/19 04/03/19  Kate Sable, MD  Tiotropium Bromide Monohydrate (SPIRIVA RESPIMAT) 1.25 MCG/ACT AERS Inhale 2 puffs into the lungs daily. 02/25/19   Brand Males, MD  TIVICAY 50 MG tablet Take 1 tablet by mouth daily. 04/22/17   [provider]  valACYclovir (VALTREX) 500 MG tablet Take 500 mg by mouth daily as needed (for flare ups).  12/06/16   [provider]    Allergies Losartan and Quinapril hcl  Family Hx Family History  Problem Relation Age of Onset  . Bradycardia Maternal Grandmother        with pacemaker    Social Hx Social History   Tobacco Use  . Smoking status: Never Smoker  . Smokeless tobacco: Never Used  Substance Use Topics  . Alcohol use: Not Currently    Alcohol/week: 2.0 standard drinks    Types: 2 Standard  drinks or equivalent per week    Comment: has not had any alcohol in a while  . Drug use: No     Review of Systems  Constitutional: Negative for fever, chills. Eyes: Negative for visual changes. ENT: Negative for sore throat. Cardiovascular: + for chest pain. Respiratory: Negative for shortness of breath. Gastrointestinal: Negative for nausea, vomiting.  Genitourinary: Negative for dysuria. Musculoskeletal: Negative for leg swelling. Skin: Negative for rash. Neurological: + for headaches.   Physical Exam  Vital Signs: ED Triage Vitals  Enc Vitals Group     BP 03/05/19 1546 (!) 199/112     Pulse Rate 03/05/19 1546 79     Resp 03/05/19 1547 19     Temp 03/05/19 1546 98 F (36.7 C)     Temp Source 03/05/19 1546 Oral     SpO2 03/05/19 1546 100 %     Weight 03/05/19 1424 187 lb (84.8 kg)     Height 03/05/19 1424 6\' 2"  (  1.88 m)     Head Circumference --      Peak Flow --      Pain Score 03/05/19 1424 6     Pain Loc --      Pain Edu? --      Excl. in [place]? --     Constitutional: Alert and oriented.  Head: Normocephalic. Atraumatic. Eyes: Conjunctivae clear. Sclera anicteric. Nose: No congestion. No rhinorrhea. Mouth/Throat: Wearing mask.  Neck: No stridor.   Cardiovascular: Normal rate, regular rhythm. Extremities well perfused. Respiratory: Normal respiratory effort.  Lungs CTAB. Gastrointestinal: Soft. Non-tender. Non-distended.  Musculoskeletal: Mild, symmetric BLE edema. No deformities. Neurologic:  Normal speech and language. No gross focal neurologic deficits are appreciated.  Skin: Skin is warm, dry and intact. No rash noted. Psychiatric: Mood and affect are appropriate for situation.  EKG  Personally reviewed.   Rate: 70 Rhythm: sinus Axis: normal, borderline leftward Intervals: WNL No peaked T waves No STEMI    Radiology  CXR: IMPRESSION:  No active cardiopulmonary disease.    Procedures  Procedure(s) performed (including critical  care):  .Critical Care Performed by: Lilia Pro., MD Authorized by: Lilia Pro., MD   Critical care provider statement:    Critical care time (minutes):  30   Critical care was time spent personally by me on the following activities:  Discussions with consultants, evaluation of patient's response to treatment, examination of patient, ordering and performing treatments and interventions, ordering and review of laboratory studies, ordering and review of radiographic studies, pulse oximetry, re-evaluation of patient's condition, obtaining history from patient or surrogate and review of old charts     Initial Impression / Assessment and Plan / ED Course  59 y.o. male who presents to the ED for HTN, headache, chest discomfort  Ddx: hypertensive emergency, volume overload/refractory HTN due to ESRD, ACS  Will obtain labs, EKG, imaging.  On chart review, seems like SBP ~150s is typically where he sits when most controlled. Trial labetalol for BP, reassess.  Labs reveal hyperK to 6.2, no peaked T waves on exam. Will give calcium and Veltassa, per Nephrology recommendations.  HS troponin mildly elevated, likely partially in setting of ESRD. Will trend. No STEMI on EKG. Discussed with nephrology who would be able to dialyze in AM, recommend Veltassa.  Will admit for BP control, dialysis. Patient agreeable. Discussed w/ hospitalist for admission.     Final Clinical Impression(s) / ED Diagnosis  Final diagnoses:  ESRD on dialysis [facility])  Hyperkalemia  Hypertensive urgency       Note:  This document was prepared using Dragon voice recognition software and may include unintentional dictation errors.   Lilia Pro., MD 03/05/19 [phone]

## 2019-03-05 NOTE — ED Triage Notes (Signed)
Pt reports is having HTN. Pt reports was seen at his MD office yesterday and given a new medication but it is not working. Pt reports was on his way to dialysis his MD told him if the meds didn't work and his BP was still high to come here. Pt reports 215/107 at home. Pt reports HA for 3 days as well.

## 2019-03-05 NOTE — ED Notes (Signed)
Spoke to [place], NP regarding pt's BP 188/104. Per NP, pt will be getting cardura, clonidine patch, and coreg tonight. BP okay for floor at this time.

## 2019-03-06 ENCOUNTER — Telehealth: Payer: Self-pay | Admitting: Family Medicine

## 2019-03-06 DIAGNOSIS — E875 Hyperkalemia: Secondary | ICD-10-CM

## 2019-03-06 DIAGNOSIS — N186 End stage renal disease: Secondary | ICD-10-CM

## 2019-03-06 DIAGNOSIS — I160 Hypertensive urgency: Secondary | ICD-10-CM

## 2019-03-06 DIAGNOSIS — B20 Human immunodeficiency virus [HIV] disease: Secondary | ICD-10-CM

## 2019-03-06 DIAGNOSIS — Z992 Dependence on renal dialysis: Secondary | ICD-10-CM

## 2019-03-06 LAB — COMPREHENSIVE METABOLIC PANEL
ALT: 10 U/L (ref 0–44)
AST: 18 U/L (ref 15–41)
Albumin: 4 g/dL (ref 3.5–5.0)
Alkaline Phosphatase: 50 U/L (ref 38–126)
Anion gap: 16 — ABNORMAL HIGH (ref 5–15)
BUN: 98 mg/dL — ABNORMAL HIGH (ref 6–20)
CO2: 22 mmol/L (ref 22–32)
Calcium: 9.7 mg/dL (ref 8.9–10.3)
Chloride: 102 mmol/L (ref 98–111)
Creatinine, Ser: 20.08 mg/dL — ABNORMAL HIGH (ref 0.61–1.24)
GFR calc Af Amer: 3 mL/min — ABNORMAL LOW (ref >60)
GFR calc non Af Amer: 2 mL/min — ABNORMAL LOW (ref >60)
Glucose, Bld: 97 mg/dL (ref 70–99)
Potassium: 5.7 mmol/L — ABNORMAL HIGH (ref 3.5–5.1)
Sodium: 140 mmol/L (ref 135–145)
Total Bilirubin: 1.2 mg/dL (ref 0.3–1.2)
Total Protein: 7.6 g/dL (ref 6.5–8.1)

## 2019-03-06 LAB — CBC
HCT: 28.6 % — ABNORMAL LOW (ref 39.0–52.0)
Hemoglobin: 9.7 g/dL — ABNORMAL LOW (ref 13.0–17.0)
MCH: 35.5 pg — ABNORMAL HIGH (ref 26.0–34.0)
MCHC: 33.9 g/dL (ref 30.0–36.0)
MCV: 104.8 fL — ABNORMAL HIGH (ref 80.0–100.0)
Platelets: 120 10*3/uL — ABNORMAL LOW (ref 150–400)
RBC: 2.73 MIL/uL — ABNORMAL LOW (ref 4.22–5.81)
RDW: 17.1 % — ABNORMAL HIGH (ref 11.5–15.5)
WBC: 6.8 10*3/uL (ref 4.0–10.5)
nRBC: 0 % (ref 0.0–0.2)

## 2019-03-06 LAB — PROTIME-INR
INR: 1 (ref 0.8–1.2)
Prothrombin Time: 13.1 seconds (ref 11.4–15.2)

## 2019-03-06 LAB — MAGNESIUM: Magnesium: 2.4 mg/dL (ref 1.7–2.4)

## 2019-03-06 LAB — MRSA PCR SCREENING: MRSA by PCR: NEGATIVE

## 2019-03-06 LAB — APTT: aPTT: 33 seconds (ref 24–36)

## 2019-03-06 MED ORDER — CHLORHEXIDINE GLUCONATE CLOTH 2 % EX PADS: 6.0000 | MEDICATED_PAD | Freq: Every day | CUTANEOUS | Status: DC

## 2019-03-06 MED ORDER — FUROSEMIDE 10 MG/ML IJ SOLN
80.0000 mg | Freq: Once | INTRAMUSCULAR | Status: AC
  Administered 2019-03-06: 80 mg via INTRAVENOUS
  Filled 2019-03-06: qty 8

## 2019-03-06 MED ORDER — CLONIDINE HCL 0.3 MG/24HR TD PTWK
0.3000 mg | MEDICATED_PATCH | TRANSDERMAL | Status: DC
  Filled 2019-03-06: qty 1

## 2019-03-06 MED ORDER — BENZONATATE 100 MG PO CAPS
100.0000 mg | ORAL_CAPSULE | Freq: Three times a day (TID) | ORAL | Status: DC | PRN
  Administered 2019-03-06: 100 mg via ORAL
  Filled 2019-03-06 (×2): qty 1

## 2019-03-06 MED ORDER — HYDRALAZINE HCL 20 MG/ML IJ SOLN
20.0000 mg | INTRAMUSCULAR | Status: DC | PRN
  Administered 2019-03-06: 20 mg via INTRAVENOUS
  Filled 2019-03-06: qty 1

## 2019-03-06 MED ORDER — LABETALOL HCL 5 MG/ML IV SOLN: 20.0000 mg | Freq: Once | INTRAVENOUS | Status: DC

## 2019-03-06 MED ORDER — HEPARIN SODIUM (PORCINE) 1000 UNIT/ML DIALYSIS: 20.0000 [IU]/kg | INTRAMUSCULAR | Status: DC | PRN

## 2019-03-06 MED ORDER — NEPRO/CARBSTEADY PO LIQD: 237.0000 mL | Freq: Three times a day (TID) | ORAL | Status: DC

## 2019-03-06 MED ORDER — BENZONATATE 100 MG PO CAPS: 100.0000 mg | ORAL_CAPSULE | Freq: Three times a day (TID) | ORAL | 0 refills | Status: DC | PRN

## 2019-03-06 MED ORDER — CLONIDINE HCL 0.2 MG/24HR TD PTWK: 0.2000 mg | MEDICATED_PATCH | TRANSDERMAL | Status: DC

## 2019-03-06 MED ORDER — GUAIFENESIN-DM 100-10 MG/5ML PO SYRP
5.0000 mL | ORAL_SOLUTION | ORAL | Status: DC | PRN
  Administered 2019-03-06: 5 mL via ORAL
  Filled 2019-03-06: qty 5

## 2019-03-06 MED ORDER — AMLODIPINE BESYLATE 10 MG PO TABS
10.0000 mg | ORAL_TABLET | Freq: Every day | ORAL | Status: DC
  Administered 2019-03-06 (×2): 10 mg via ORAL
  Filled 2019-03-06 (×3): qty 1

## 2019-03-06 NOTE — Progress Notes (Signed)
Pre HD Tx Assessment   03/06/19 0945  Neurological  Level of Consciousness Alert  Orientation Level Oriented X4  Respiratory  Respiratory Pattern Regular;Accessory muscle use  Chest Assessment Chest expansion symmetrical  R Upper  Breath Sounds Clear  L Upper Breath Sounds Clear  R Lower Breath Sounds Diminished;Fine crackles  L Lower Breath Sounds Diminished;Fine crackles  Cough None  Cardiac  Pulse Regular  Heart Sounds S1, S2  Jugular Venous Distention (JVD) No  ECG Monitor Yes  Cardiac Rhythm NSR  Antiarrhythmic device No  Vascular  R Radial Pulse +3  L Radial Pulse +3  Edema Right lower extremity;Left lower extremity  RLE Edema +2  LLE Edema +2  Integumentary  Integumentary (WDL) WDL  Musculoskeletal  Musculoskeletal (WDL) X  Generalized Weakness Yes  Gastrointestinal  Bowel Sounds Assessment Active  Last BM Date 03/06/19  GU Assessment  Genitourinary (WDL) WDL  Psychosocial  Psychosocial (WDL) X  Patient Behaviors Calm;Flat affect  Needs Expressed Emotional  Emotional support given Given to patient

## 2019-03-06 NOTE — Progress Notes (Addendum)
0915-Upon assessment patient stated he had 10/10 pain in head, did not want any medication except for "the cough medicine I take at home". States the headache comes when he coughs and also states he knows it is because his bp is elevated (186/104).  He has been unable to recall the medication and is attempting to call PCP and roommate to see if they knew the medication and has been unsuccessful.  Held BP medication this morning due to dialysis. Will continue to monitor pain when patient returns to floor  0920- pt transported down to dialysis

## 2019-03-06 NOTE — Telephone Encounter (Signed)
Kate Sable, MD  You 18 hours ago (2:35 PM)   Please cancel visit with hypertensive clinic. Patient's severely elevated blood pressures is most likely secondary to volume overload as a result of end-stage renal disease. Fluid needs to be taken off during dialysis sessions in order to control blood pressures. I have discussed this plan with the patient's nephrologist who also recommends taking off fluid during dialysis sessions. Patient also on advised not to miss dialysis sessions. If after taking off fluid during dialysis and patient still hypertensive, we will consider further therapy. Keep follow-up appointments with myself and nephrology.   Routing comment

## 2019-03-06 NOTE — Plan of Care (Signed)
  Problem: Clinical Measurements: Goal: Ability to maintain clinical measurements within normal limits will improve Outcome: Progressing Goal: Will remain free from infection Outcome: Progressing Goal: Diagnostic test results will improve Outcome: Progressing Goal: Respiratory complications will improve Outcome: Progressing Goal: Cardiovascular complication will be avoided Outcome: Progressing  HD Tx progressing. Prescribed UF goal of 3.5L was met. Pt BP continues to be high but not different from baseline.

## 2019-03-06 NOTE — Progress Notes (Signed)
Post HD Tx Assessment   03/06/19 1319  Neurological  Level of Consciousness Alert  Orientation Level Oriented X4  Respiratory  Respiratory Pattern Regular;Accessory muscle use  Chest Assessment Chest expansion symmetrical  Bilateral Breath Sounds Diminished;Fine crackles;Clear  R Upper  Breath Sounds Fine crackles  L Upper Breath Sounds Clear;Diminished  R Lower Breath Sounds Fine crackles  L Lower Breath Sounds Fine crackles  Cardiac  Pulse Regular  Heart Sounds S1, S2  Jugular Venous Distention (JVD) No  ECG Monitor Yes  Cardiac Rhythm NSR  Antiarrhythmic device No  Vascular  R Radial Pulse +3  L Radial Pulse +3  Edema Right lower extremity;Left lower extremity  RLE Edema +2  LLE Edema +2  Integumentary  Integumentary (WDL) WDL  Musculoskeletal  Musculoskeletal (WDL) X  Generalized Weakness Yes  Gastrointestinal  Bowel Sounds Assessment Active  Last BM Date 03/06/19  GU Assessment  Genitourinary (WDL) WDL  Psychosocial  Psychosocial (WDL) X  Patient Behaviors Calm;Flat affect  Needs Expressed Emotional  Emotional support given Given to patient

## 2019-03-06 NOTE — Discharge Instructions (Signed)
Dialysis Dialysis is a procedure that is done when the kidneys have stopped working properly (kidney failure). It may also be done earlier if it may help improve symptoms. During dialysis, wastes, salt, and extra water are removed from the blood, and the levels of certain minerals in the blood are maintained. Dialysis is done in sessions which are continued until the kidneys get better. If the kidneys cannot get better, such as in end-stage kidney disease, dialysis is continued for life or until you receive a new kidney from a donor (kidney transplant). There are two types of dialysis: hemodialysis and peritoneal dialysis. What is hemodialysis?        Hemodialysis is when a machine called a dialyzer is used to filter the blood. Before starting hemodialysis, you will have surgery to create a site where blood can be removed from the body and returned to the body (vascular access). There are three types of vascular accesses:  Arteriovenous fistula. This type of access is created when an artery and a vein (usually in the arm) are connected during surgery. The arteriovenous fistula usually takes 1-6 months to develop after surgery. It may last longer than the other types of vascular accesses and is less likely to become infected or cause blood clots.  Arteriovenous graft. This type of access is created when an artery and a vein in the arm are connected during surgery with a tube. An arteriovenous graft can usually be used within 2-3 weeks of surgery.  A venous catheter. To create this type of access, a thin tube (catheter) is placed in a large vein in your neck, chest, or groin. A venous catheter can be used right away. It is usually used as a temporary access when dialysis needs to begin immediately. During hemodialysis, blood leaves your body through your access site. It travels through a tube to the dialyzer, where it is filtered. The blood then returns to your body through another tube. Hemodialysis  is usually done at a hospital or dialysis center three times a week. Visits last about 3-5 hours. With special training, it may also be done at home with the help of another person. What is peritoneal dialysis? Peritoneal dialysis is when the thin lining of the abdomen (peritoneum) and a fluid called dialysate are used to filter the blood. Before starting peritoneal dialysis, you will have surgery to place a catheter in your abdomen. The catheter will be used to transfer dialysate to and from your abdomen. At the start of a session, your abdomen is filled with dialysate. During the session, wastes, salt, and extra water in the blood pass through the peritoneum and into the dialysate. The dialysate is drained from the body at the end of the session. The process of filling and draining the dialysate is called an exchange. Exchanges are repeated until you have used up all the dialysate for the day. You may do peritoneal dialysis at home or at almost any other location. It is done every day. You may need up to five exchanges a day. Each exchange takes about 30-40 minutes. The amount of time the dialysate is in your body between exchanges is called a dwell. The dwell usually lasts 1.5-3 hours and can vary with each person. You may choose to do exchanges at night while you sleep, using a machine called a cycler. Which type of dialysis should I choose? Both types of dialysis have advantages and disadvantages. Talk with your health care provider about which type of dialysis is best  for you. Your lifestyle, preferences, and medical condition should be considered. In some cases, only one type of dialysis can be chosen. Advantages of hemodialysis  It is done less often than peritoneal dialysis.  Someone else can do the dialysis for you.  If you go to a dialysis center: ? Your health care provider can recognize any problems you may be having. ? You can interact with others who are having dialysis. This can  provide you with emotional support. Disadvantages of hemodialysis  Hemodialysis may cause cramps and low blood pressure. It may leave you feeling tired on the days you have the treatment.  If you go to a dialysis center, you will need to make weekly appointments and work around the center's schedule.  You will need to take extra care when traveling. If you usually get treatment in a dialysis center, you will need to arrange to visit a dialysis center near your destination. If you are having treatments at home, you will need to take the dialyzer with you when traveling.  There are more eating restrictions than with peritoneal dialysis. Advantages of peritoneal dialysis  It is less likely than hemodialysis to cause cramps and low blood pressure.  There are fewer eating restrictions than with hemodialysis.  You may do exchanges on your own wherever you are, including when you travel. Disadvantages of peritoneal dialysis  It is done more often than hemodialysis.  Doing peritoneal dialysis requires you to have a good use (dexterity) of your hands. You must also be able to lift bags.  You must learn how to make your equipment free of germs (sterilization techniques). You will need to use these techniques every day to prevent infection. What changes will I need to make to my diet during dialysis? Both types of dialysis require you to make some changes to your diet. For example, you will need to limit your intake of foods that contain a lot of phosphorus and potassium. You will also need to limit your fluid intake. A diet and nutrition specialist (dietitian) can help you make a meal plan that can help improve your dialysis and your health. What should I expect when starting dialysis? Adjusting to the dialysis treatment, schedule, and diet can take some time. You may need to stop working and may not be able to do some of your normal activities. You may feel anxious or depressed when starting  dialysis. Over time, many people feel better overall because of dialysis. You may be able to return to work after making some changes, such as reducing work intensity. Where to find more information  [place]: www.kidney.org  American Association of Kidney Patients: BombTimer.gl  [employer]: www.kidneyfund.org Summary  During dialysis, wastes, salt, and extra water are removed from the blood, and the levels of certain minerals in the blood are maintained. There are two types of dialysis: hemodialysis and peritoneal dialysis.  Hemodialysis is when a machine called a dialyzer is used to filter the blood.  Hemodialysis is usually done by a health care provider at a hospital or dialysis center three times a week.  Peritoneal dialysis is when the peritoneum is used as a filter. You may do peritoneal dialysis at home or at almost any other location.  Both types of dialysis have advantages and disadvantages. Talk with your health care provider about which type of dialysis is best for you. This information is not intended to replace advice given to you by your health care provider. Make sure you  discuss any questions you have with your health care provider. Document Revised: 06/18/2018 Document Reviewed: 03/28/2016 Elsevier Patient Education  [place].   Hypertension, Adult High blood pressure (hypertension) is when the force of blood pumping through the arteries is too strong. The arteries are the blood vessels that carry blood from the heart throughout the body. Hypertension forces the heart to work harder to pump blood and may cause arteries to become narrow or stiff. Untreated or uncontrolled hypertension can cause a heart attack, heart failure, a stroke, kidney disease, and other problems. A blood pressure reading consists of a higher number over a lower number. Ideally, your blood pressure should be below 120/80. The first ("top") number is called the  systolic pressure. It is a measure of the pressure in your arteries as your heart beats. The second ("bottom") number is called the diastolic pressure. It is a measure of the pressure in your arteries as the heart relaxes. What are the causes? The exact cause of this condition is not known. There are some conditions that result in or are related to high blood pressure. What increases the risk? Some risk factors for high blood pressure are under your control. The following factors may make you more likely to develop this condition:  Smoking.  Having type 2 diabetes mellitus, high cholesterol, or both.  Not getting enough exercise or physical activity.  Being overweight.  Having too much fat, sugar, calories, or salt (sodium) in your diet.  Drinking too much alcohol. Some risk factors for high blood pressure may be difficult or impossible to change. Some of these factors include:  Having chronic kidney disease.  Having a family history of high blood pressure.  Age. Risk increases with age.  Race. You may be at higher risk if you are African American.  Gender. Men are at higher risk than women before age 73. After age 15, women are at higher risk than men.  Having obstructive sleep apnea.  Stress. What are the signs or symptoms? High blood pressure may not cause symptoms. Very high blood pressure (hypertensive crisis) may cause:  Headache.  Anxiety.  Shortness of breath.  Nosebleed.  Nausea and vomiting.  Vision changes.  Severe chest pain.  Seizures. How is this diagnosed? This condition is diagnosed by measuring your blood pressure while you are seated, with your arm resting on a flat surface, your legs uncrossed, and your feet flat on the floor. The cuff of the blood pressure monitor will be placed directly against the skin of your upper arm at the level of your heart. It should be measured at least twice using the same arm. Certain conditions can cause a difference  in blood pressure between your right and left arms. Certain factors can cause blood pressure readings to be lower or higher than normal for a short period of time:  When your blood pressure is higher when you are in a health care provider's office than when you are at home, this is called white coat hypertension. Most people with this condition do not need medicines.  When your blood pressure is higher at home than when you are in a health care provider's office, this is called masked hypertension. Most people with this condition may need medicines to control blood pressure. If you have a high blood pressure reading during one visit or you have normal blood pressure with other risk factors, you may be asked to:  Return on a different day to have your blood pressure  checked again.  Monitor your blood pressure at home for 1 week or longer. If you are diagnosed with hypertension, you may have other blood or imaging tests to help your health care provider understand your overall risk for other conditions. How is this treated? This condition is treated by making healthy lifestyle changes, such as eating healthy foods, exercising more, and reducing your alcohol intake. Your health care provider may prescribe medicine if lifestyle changes are not enough to get your blood pressure under control, and if:  Your systolic blood pressure is above 130.  Your diastolic blood pressure is above 80. Your personal target blood pressure may vary depending on your medical conditions, your age, and other factors. Follow these instructions at home: Eating and drinking   Eat a diet that is high in fiber and potassium, and low in sodium, added sugar, and fat. An example eating plan is called the DASH (Dietary Approaches to Stop Hypertension) diet. To eat this way: ? Eat plenty of fresh fruits and vegetables. Try to fill one half of your plate at each meal with fruits and vegetables. ? Eat whole grains, such as  whole-wheat pasta, brown rice, or whole-grain bread. Fill about one fourth of your plate with whole grains. ? Eat or drink low-fat dairy products, such as skim milk or low-fat yogurt. ? Avoid fatty cuts of meat, processed or cured meats, and poultry with skin. Fill about one fourth of your plate with lean proteins, such as fish, chicken without skin, beans, eggs, or tofu. ? Avoid pre-made and processed foods. These tend to be higher in sodium, added sugar, and fat.  Reduce your daily sodium intake. Most people with hypertension should eat less than 1,500 mg of sodium a day.  Do not drink alcohol if: ? Your health care provider tells you not to drink. ? You are pregnant, may be pregnant, or are planning to become pregnant.  If you drink alcohol: ? Limit how much you use to:  0-1 drink a day for women.  0-2 drinks a day for men. ? Be aware of how much alcohol is in your drink. In the [place] one drink equals one 12 oz bottle of beer (355 mL), one 5 oz glass of wine (148 mL), or one 1 oz glass of hard liquor (44 mL). Lifestyle   Work with your health care provider to maintain a healthy body weight or to lose weight. Ask what an ideal weight is for you.  Get at least 30 minutes of exercise most days of the week. Activities may include walking, swimming, or biking.  Include exercise to strengthen your muscles (resistance exercise), such as Pilates or lifting weights, as part of your weekly exercise routine. Try to do these types of exercises for 30 minutes at least 3 days a week.  Do not use any products that contain nicotine or tobacco, such as cigarettes, e-cigarettes, and chewing tobacco. If you need help quitting, ask your health care provider.  Monitor your blood pressure at home as told by your health care provider.  Keep all follow-up visits as told by your health care provider. This is important. Medicines  Take over-the-counter and prescription medicines only as told by your  health care provider. Follow directions carefully. Blood pressure medicines must be taken as prescribed.  Do not skip doses of blood pressure medicine. Doing this puts you at risk for problems and can make the medicine less effective.  Ask your health care provider about side effects or  reactions to medicines that you should watch for. Contact a health care provider if you:  Think you are having a reaction to a medicine you are taking.  Have headaches that keep coming back (recurring).  Feel dizzy.  Have swelling in your ankles.  Have trouble with your vision. Get help right away if you:  Develop a severe headache or confusion.  Have unusual weakness or numbness.  Feel faint.  Have severe pain in your chest or abdomen.  Vomit repeatedly.  Have trouble breathing. Summary  Hypertension is when the force of blood pumping through your arteries is too strong. If this condition is not controlled, it may put you at risk for serious complications.  Your personal target blood pressure may vary depending on your medical conditions, your age, and other factors. For most people, a normal blood pressure is less than 120/80.  Hypertension is treated with lifestyle changes, medicines, or a combination of both. Lifestyle changes include losing weight, eating a healthy, low-sodium diet, exercising more, and limiting alcohol. This information is not intended to replace advice given to you by your health care provider. Make sure you discuss any questions you have with your health care provider. Document Revised: 10/10/2017 Document Reviewed: 10/10/2017 Elsevier Patient Education  2020 [place] Your Hypertension Hypertension is commonly called high blood pressure. This is when the force of your blood pressing against the walls of your arteries is too strong. Arteries are blood vessels that carry blood from your heart throughout your body. Hypertension forces the heart to work  harder to pump blood, and may cause the arteries to become narrow or stiff. Having untreated or uncontrolled hypertension can cause heart attack, stroke, kidney disease, and other problems. What are blood pressure readings? A blood pressure reading consists of a higher number over a lower number. Ideally, your blood pressure should be below 120/80. The first ("top") number is called the systolic pressure. It is a measure of the pressure in your arteries as your heart beats. The second ("bottom") number is called the diastolic pressure. It is a measure of the pressure in your arteries as the heart relaxes. What does my blood pressure reading mean? Blood pressure is classified into four stages. Based on your blood pressure reading, your health care provider may use the following stages to determine what type of treatment you need, if any. Systolic pressure and diastolic pressure are measured in a unit called mm Hg. Normal  Systolic pressure: below 123456.  Diastolic pressure: below 80. Elevated  Systolic pressure: [id number].  Diastolic pressure: below 80. Hypertension stage 1  Systolic pressure: 0000000.  Diastolic pressure: [id number]. Hypertension stage 2  Systolic pressure: [id number] or above.  Diastolic pressure: 90 or above. What health risks are associated with hypertension? Managing your hypertension is an important responsibility. Uncontrolled hypertension can lead to:  A heart attack.  A stroke.  A weakened blood vessel (aneurysm).  Heart failure.  Kidney damage.  Eye damage.  Metabolic syndrome.  Memory and concentration problems. What changes can I make to manage my hypertension? Hypertension can be managed by making lifestyle changes and possibly by taking medicines. Your health care provider will help you make a plan to bring your blood pressure within a normal range. Eating and drinking   Eat a diet that is high in fiber and potassium, and low in salt (sodium), added  sugar, and fat. An example eating plan is called the DASH (Dietary Approaches to Stop Hypertension) diet. To eat  this way: ? Eat plenty of fresh fruits and vegetables. Try to fill half of your plate at each meal with fruits and vegetables. ? Eat whole grains, such as whole wheat pasta, brown rice, or whole grain bread. Fill about one quarter of your plate with whole grains. ? Eat low-fat diary products. ? Avoid fatty cuts of meat, processed or cured meats, and poultry with skin. Fill about one quarter of your plate with lean proteins such as fish, chicken without skin, beans, eggs, and tofu. ? Avoid premade and processed foods. These tend to be higher in sodium, added sugar, and fat.  Reduce your daily sodium intake. Most people with hypertension should eat less than 1,500 mg of sodium a day.  Limit alcohol intake to no more than 1 drink a day for nonpregnant women and 2 drinks a day for men. One drink equals 12 oz of beer, 5 oz of wine, or 1 oz of hard liquor. Lifestyle  Work with your health care provider to maintain a healthy body weight, or to lose weight. Ask what an ideal weight is for you.  Get at least 30 minutes of exercise that causes your heart to beat faster (aerobic exercise) most days of the week. Activities may include walking, swimming, or biking.  Include exercise to strengthen your muscles (resistance exercise), such as weight lifting, as part of your weekly exercise routine. Try to do these types of exercises for 30 minutes at least 3 days a week.  Do not use any products that contain nicotine or tobacco, such as cigarettes and e-cigarettes. If you need help quitting, ask your health care provider.  Control any long-term (chronic) conditions you have, such as high cholesterol or diabetes. Monitoring  Monitor your blood pressure at home as told by your health care provider. Your personal target blood pressure may vary depending on your medical conditions, your age, and other  factors.  Have your blood pressure checked regularly, as often as told by your health care provider. Working with your health care provider  Review all the medicines you take with your health care provider because there may be side effects or interactions.  Talk with your health care provider about your diet, exercise habits, and other lifestyle factors that may be contributing to hypertension.  Visit your health care provider regularly. Your health care provider can help you create and adjust your plan for managing hypertension. Will I need medicine to control my blood pressure? Your health care provider may prescribe medicine if lifestyle changes are not enough to get your blood pressure under control, and if:  Your systolic blood pressure is 130 or higher.  Your diastolic blood pressure is 80 or higher. Take medicines only as told by your health care provider. Follow the directions carefully. Blood pressure medicines must be taken as prescribed. The medicine does not work as well when you skip doses. Skipping doses also puts you at risk for problems. Contact a health care provider if:  You think you are having a reaction to medicines you have taken.  You have repeated (recurrent) headaches.  You feel dizzy.  You have swelling in your ankles.  You have trouble with your vision. Get help right away if:  You develop a severe headache or confusion.  You have unusual weakness or numbness, or you feel faint.  You have severe pain in your chest or abdomen.  You vomit repeatedly.  You have trouble breathing. Summary  Hypertension is when the force of blood  pumping through your arteries is too strong. If this condition is not controlled, it may put you at risk for serious complications.  Your personal target blood pressure may vary depending on your medical conditions, your age, and other factors. For most people, a normal blood pressure is less than 120/80.  Hypertension is  managed by lifestyle changes, medicines, or both. Lifestyle changes include weight loss, eating a healthy, low-sodium diet, exercising more, and limiting alcohol. This information is not intended to replace advice given to you by your health care provider. Make sure you discuss any questions you have with your health care provider. Document Revised: 05/24/2018 Document Reviewed: 12/29/2015 Elsevier Patient Education  [place].   Hypertension, Adult Hypertension is another name for high blood pressure. High blood pressure forces your heart to work harder to pump blood. This can cause problems over time. There are two numbers in a blood pressure reading. There is a top number (systolic) over a bottom number (diastolic). It is best to have a blood pressure that is below 120/80. Healthy choices can help lower your blood pressure, or you may need medicine to help lower it. What are the causes? The cause of this condition is not known. Some conditions may be related to high blood pressure. What increases the risk?  Smoking.  Having type 2 diabetes mellitus, high cholesterol, or both.  Not getting enough exercise or physical activity.  Being overweight.  Having too much fat, sugar, calories, or salt (sodium) in your diet.  Drinking too much alcohol.  Having long-term (chronic) kidney disease.  Having a family history of high blood pressure.  Age. Risk increases with age.  Race. You may be at higher risk if you are African American.  Gender. Men are at higher risk than women before age 80. After age 69, women are at higher risk than men.  Having obstructive sleep apnea.  Stress. What are the signs or symptoms?  High blood pressure may not cause symptoms. Very high blood pressure (hypertensive crisis) may cause: ? Headache. ? Feelings of worry or nervousness (anxiety). ? Shortness of breath. ? Nosebleed. ? A feeling of being sick to your stomach (nausea). ? Throwing up  (vomiting). ? Changes in how you see. ? Very bad chest pain. ? Seizures. How is this treated?  This condition is treated by making healthy lifestyle changes, such as: ? Eating healthy foods. ? Exercising more. ? Drinking less alcohol.  Your health care provider may prescribe medicine if lifestyle changes are not enough to get your blood pressure under control, and if: ? Your top number is above 130. ? Your bottom number is above 80.  Your personal target blood pressure may vary. Follow these instructions at home: Eating and drinking   If told, follow the DASH eating plan. To follow this plan: ? Fill one half of your plate at each meal with fruits and vegetables. ? Fill one fourth of your plate at each meal with whole grains. Whole grains include whole-wheat pasta, brown rice, and whole-grain bread. ? Eat or drink low-fat dairy products, such as skim milk or low-fat yogurt. ? Fill one fourth of your plate at each meal with low-fat (lean) proteins. Low-fat proteins include fish, chicken without skin, eggs, beans, and tofu. ? Avoid fatty meat, cured and processed meat, or chicken with skin. ? Avoid pre-made or processed food.  Eat less than 1,500 mg of salt each day.  Do not drink alcohol if: ? Your doctor tells you  not to drink. ? You are pregnant, may be pregnant, or are planning to become pregnant.  If you drink alcohol: ? Limit how much you use to:  0-1 drink a day for women.  0-2 drinks a day for men. ? Be aware of how much alcohol is in your drink. In the [place] one drink equals one 12 oz bottle of beer (355 mL), one 5 oz glass of wine (148 mL), or one 1 oz glass of hard liquor (44 mL). Lifestyle   Work with your doctor to stay at a healthy weight or to lose weight. Ask your doctor what the best weight is for you.  Get at least 30 minutes of exercise most days of the week. This may include walking, swimming, or biking.  Get at least 30 minutes of exercise that  strengthens your muscles (resistance exercise) at least 3 days a week. This may include lifting weights or doing Pilates.  Do not use any products that contain nicotine or tobacco, such as cigarettes, e-cigarettes, and chewing tobacco. If you need help quitting, ask your doctor.  Check your blood pressure at home as told by your doctor.  Keep all follow-up visits as told by your doctor. This is important. Medicines  Take over-the-counter and prescription medicines only as told by your doctor. Follow directions carefully.  Do not skip doses of blood pressure medicine. The medicine does not work as well if you skip doses. Skipping doses also puts you at risk for problems.  Ask your doctor about side effects or reactions to medicines that you should watch for. Contact a doctor if you:  Think you are having a reaction to the medicine you are taking.  Have headaches that keep coming back (recurring).  Feel dizzy.  Have swelling in your ankles.  Have trouble with your vision. Get help right away if you:  Get a very bad headache.  Start to feel mixed up (confused).  Feel weak or numb.  Feel faint.  Have very bad pain in your: ? Chest. ? Belly (abdomen).  Throw up more than once.  Have trouble breathing. Summary  Hypertension is another name for high blood pressure.  High blood pressure forces your heart to work harder to pump blood.  For most people, a normal blood pressure is less than 120/80.  Making healthy choices can help lower blood pressure. If your blood pressure does not get lower with healthy choices, you may need to take medicine. This information is not intended to replace advice given to you by your health care provider. Make sure you discuss any questions you have with your health care provider. Document Revised: 10/10/2017 Document Reviewed: 10/10/2017 Elsevier Patient Education  2020 [employer].

## 2019-03-06 NOTE — Telephone Encounter (Signed)
No answer/VM full and cannot accept messages at this time.  

## 2019-03-06 NOTE — Telephone Encounter (Signed)
Appointment with Dr Oval Linsey cancelled, Left message to call back

## 2019-03-06 NOTE — Telephone Encounter (Signed)
No answer. Left message to call back to discuss recommendations.

## 2019-03-06 NOTE — Progress Notes (Signed)
Initial Nutrition Assessment  DOCUMENTATION CODES:   Not applicable  INTERVENTION:   Nepro Shake po TID, each supplement provides 425 kcal and 19 grams protein  Rena-vite daily   NUTRITION DIAGNOSIS:   Increased nutrient needs related to chronic illness(ESRD on HD, HIV) as evidenced by increased estimated needs.  GOAL:   Patient will meet greater than or equal to 90% of their needs  MONITOR:   PO intake, Supplement acceptance, Labs, Weight trends, Skin, I & O's  REASON FOR ASSESSMENT:   Malnutrition Screening Tool    ASSESSMENT:   59 y.o. male with hx HTN, ESRD on HD, diastolic dysfunction, mildly reduced EF, who presents for hypertension, chest discomfort, headache.   Unable to see patient today as pt in HD at time of RD visit. Pt with increased estimated needs r/t ESRD on HD. RD will add supplements to help pt meet his estimated needs. Per chart, pt appears fairly weight stable pta. RD will obtain nutrition related history and exam at follow up.   Medications reviewed and include: ferric citrate, ferrous sulfate, lasix, heparin, rena-vite, veltassa  Labs reviewed: K 5.7(H), BUN 98(H), creat 20.08(H), Mg 2.4 wnl Hgb 9.7(L), Hct 28.6(L), MCV 104.8(H), MCH 35.5(H)  Unable to complete Nutrition-Focused physical exam at this time.   Diet Order:   Diet Order            Diet renal/carb modified with fluid restriction Diet-HS Snack? Nothing; Fluid restriction: 1200 mL Fluid; Room service appropriate? Yes; Fluid consistency: Thin  Diet effective now             EDUCATION NEEDS:   Not appropriate for education at this time  Skin:  Skin Assessment: Reviewed RN Assessment  Last BM:  1/21  Height:   Ht Readings from Last 1 Encounters:  03/05/19 6\' 2"  (1.88 m)    Weight:   Wt Readings from Last 1 Encounters:  03/06/19 83.6 kg    Ideal Body Weight:  86.3 kg  BMI:  Body mass index is 23.65 kg/m.  Estimated Nutritional Needs:   Kcal:   2300-2600kcal/day  Protein:  >125g/day  Fluid:  UOP +1L  Koleen Distance MS, RD, LDN Pager #- [phone] Office#- [phone] After Hours Pager: [phone]

## 2019-03-06 NOTE — Progress Notes (Signed)
[facility], [place] 03/06/19  Subjective:   LOS: 0 No intake/output data recorded. Patient known to our practice from outpatient dialysis Presented to the emergency room for high blood pressure.  Still has cough, congestion, leg edema.  Also reports headache.  Missed his dialysis on Monday stating that he was visiting home hemodialysis teaching facility.  Presenting blood pressure 199/112. Patient on room air.  Dialysis evaluation requested by emergency room Patient admitted for observation   HEMODIALYSIS FLOWSHEET:  Blood Flow Rate (mL/min): 400 mL/min Arterial Pressure (mmHg): -100 mmHg Venous Pressure (mmHg): 150 mmHg Transmembrane Pressure (mmHg): 80 mmHg Ultrafiltration Rate (mL/min): 1000 mL/min Dialysate Flow Rate (mL/min): 600 ml/min Conductivity: Machine : 14 Conductivity: Machine : 14 Dialysis Fluid Bolus: Normal Saline Bolus Amount (mL): 250 mL    Objective:  Vital signs in last 24 hours:  Temp:  [97.3 F (36.3 C)-98.5 F (36.9 C)] 98.5 F (36.9 C) (01/21 1446) Pulse Rate:  [70-98] 92 (01/21 1446) Resp:  [12-30] 15 (01/21 1446) BP: (164-216)/(100-119) 181/104 (01/21 1446) SpO2:  [89 %-98 %] 95 % (01/21 1446) Weight:  [83.2 kg-83.6 kg] 83.6 kg (01/21 0357)  Weight change:  Filed Weights   03/05/19 1424 03/05/19 2253 03/06/19 0357  Weight: 84.8 kg 83.2 kg 83.6 kg    Intake/Output:    Intake/Output Summary (Last 24 hours) at 03/06/2019 1732 Last data filed at 03/06/2019 1345 Gross per 24 hour  Intake --  Output 3020 ml  Net -3020 ml     Physical Exam: General:  No acute distress, laying in the bed  HEENT  anicteric, moist oral mucous membranes  Pulm/lungs  bibasilar crackles, room air  CVS/Heart  regular, no rub or gallop  Abdomen:   Soft, nontender  Extremities: + Pitting edema  Neurologic:  Alert, oriented  Skin:  No acute rashes  Access:  Left arm AV fistula       Basic Metabolic Panel:  Recent Labs  Lab  03/05/19 1548 03/05/19 2119 03/06/19 0413  NA 142 141 140  K 6.2* 6.4* 5.7*  CL 103 103 102  CO2 23 20* 22  GLUCOSE 91 91 97  BUN 99* 98* 98*  CREATININE 20.01* 19.85* 20.08*  CALCIUM 9.4 9.6 9.7  MG  --   --  2.4     CBC: Recent Labs  Lab 03/05/19 1548 03/06/19 0413  WBC 5.3 6.8  HGB 9.6* 9.7*  HCT 30.0* 28.6*  MCV 105.3* 104.8*  PLT 134* 120*      Lab Results  Component Value Date   HEPBSAG Negative 10/09/2016   HEPBSAB Non Reactive 10/09/2016   HEPBIGM Negative 08/23/2016      Microbiology:  Recent Results (from the past 240 hour(s))  Respiratory Panel by RT PCR (Flu A&B, Covid) - Nasopharyngeal Swab     Status: None   Collection Time: 03/05/19  9:07 PM   Specimen: Nasopharyngeal Swab  Result Value Ref Range Status   SARS Coronavirus 2 by RT PCR NEGATIVE NEGATIVE Final    Comment: (NOTE) SARS-CoV-2 target nucleic acids are NOT DETECTED. The SARS-CoV-2 RNA is generally detectable in upper respiratoy specimens during the acute phase of infection. The lowest concentration of SARS-CoV-2 viral copies this assay can detect is 131 copies/mL. A negative result does not preclude SARS-Cov-2 infection and should not be used as the sole basis for treatment or other patient management decisions. A negative result may occur with  improper specimen collection/handling, submission of specimen other than nasopharyngeal swab, presence of  viral mutation(s) within the areas targeted by this assay, and inadequate number of viral copies (<131 copies/mL). A negative result must be combined with clinical observations, patient history, and epidemiological information. The expected result is Negative. Fact Sheet for Patients:  PinkCheek.be Fact Sheet for Healthcare Providers:  GravelBags.it This test is not yet ap proved or cleared by the [place] FDA and  has been authorized for detection and/or diagnosis of  SARS-CoV-2 by FDA under an Emergency Use Authorization (EUA). This EUA will remain  in effect (meaning this test can be used) for the duration of the COVID-19 declaration under Section 564(b)(1) of the Act, 21 U.S.C. section 360bbb-3(b)(1), unless the authorization is terminated or revoked sooner.    Influenza A by PCR NEGATIVE NEGATIVE Final   Influenza B by PCR NEGATIVE NEGATIVE Final    Comment: (NOTE) The Xpert Xpress SARS-CoV-2/FLU/RSV assay is intended as an aid in  the diagnosis of influenza from Nasopharyngeal swab specimens and  should not be used as a sole basis for treatment. Nasal washings and  aspirates are unacceptable for Xpert Xpress SARS-CoV-2/FLU/RSV  testing. Fact Sheet for Patients: PinkCheek.be Fact Sheet for Healthcare Providers: GravelBags.it This test is not yet approved or cleared by the [place] FDA and  has been authorized for detection and/or diagnosis of SARS-CoV-2 by  FDA under an Emergency Use Authorization (EUA). This EUA will remain  in effect (meaning this test can be used) for the duration of the  Covid-19 declaration under Section 564(b)(1) of the Act, 21  U.S.C. section 360bbb-3(b)(1), unless the authorization is  terminated or revoked. Performed at [facility], [address] [place], [place] [address]   MRSA PCR Screening     Status: None   Collection Time: 03/05/19 10:16 PM   Specimen: Nasopharyngeal  Result Value Ref Range Status   MRSA by PCR NEGATIVE NEGATIVE Final    Comment:        The GeneXpert MRSA Assay (FDA approved for NASAL specimens only), is one component of a comprehensive MRSA colonization surveillance program. It is not intended to diagnose MRSA infection nor to guide or monitor treatment for MRSA infections. Performed at [facility], [address] [place], [place] [address]     Coagulation Studies: Recent Labs     03/06/19 0413  LABPROT 13.1  INR 1.0    Urinalysis: No results for input(s): COLORURINE, LABSPEC, PHURINE, GLUCOSEU, HGBUR, BILIRUBINUR, KETONESUR, PROTEINUR, UROBILINOGEN, NITRITE, LEUKOCYTESUR in the last 72 hours.  Invalid input(s): APPERANCEUR    Imaging: DG Chest 2 View  Result Date: 03/05/2019 CLINICAL DATA:  Hypertension EXAM: CHEST - 2 VIEW COMPARISON:  12/03/2018 FINDINGS: The heart size is mildly enlarged, stable. No focal airspace consolidation, pleural effusion, or pneumothorax. Prior left seventh rib fracture. IMPRESSION: No active cardiopulmonary disease. Electronically Signed   By: Davina Poke D.O.   On: 03/05/2019 15:25     Medications:   . sodium chloride     . amLODipine  10 mg Oral Daily  . carvedilol  25 mg Oral BID  . Chlorhexidine Gluconate Cloth  6 each Topical Daily  . [START ON 03/12/2019] cloNIDine  0.2 mg Transdermal Weekly  . cloNIDine  0.3 mg Transdermal NOW  . dolutegravir  50 mg Oral Daily  . doxazosin  2 mg Oral BID  . emtricitabine-tenofovir AF  1 tablet Oral Daily  . febuxostat  80 mg Oral Daily  . feeding supplement (NEPRO CARB STEADY)  237 mL Oral TID BM  .  ferric citrate  210 mg Oral TID AC & HS  . furosemide  80 mg Oral Daily  . halobetasol   Topical BID  . heparin  5,000 Units Subcutaneous Q8H  . labetalol  20 mg Intravenous Once  . loratadine  10 mg Oral Daily  . multivitamin  1 tablet Oral Daily  . patiromer  16.8 g Oral Daily  . sodium chloride flush  3 mL Intravenous Q12H  . Tiotropium Bromide Monohydrate  2 puff Inhalation Daily  . umeclidinium-vilanterol  1 puff Inhalation Daily   sodium chloride, acetaminophen **OR** acetaminophen, benzonatate, guaiFENesin-dextromethorphan, hydrALAZINE, labetalol, methocarbamol, sodium chloride flush, valACYclovir  Assessment/ Plan:  59 y.o. male with end-stage renal disease, poorly controlled hypertension, HIV, gout  Principal Problem:   Hypertensive urgency Active Problems:    Human immunodeficiency virus (HIV) disease (HCC)   Rosacea   Neck muscle spasm   ESRD on dialysis (HCC)   Chronic gouty arthropathy without tophi   History of anemia due to CKD   Mild protein-calorie malnutrition (HCC)   Prolonged Q-T interval on ECG   #.  Hyperkalemia, volume overload, shortness of breath, hypertension in the setting of ESRD Hemodialysis to be done today Seen during dialysis.  Volume removal as tolerated.  Goal 3 L as tolerated   #. Anemia of CKD  Lab Results  Component Value Date   HGB 9.7 (L) 03/06/2019   Low dose EPO with HD as outpatient  #. [facility]     Component Value Date/Time   PTH 20 08/22/2016 2115   Lab Results  Component Value Date   PHOS 3.3 10/11/2016   Continue home dose of phos binders    LOS: 0 Jaquesha Boroff 1/21/20215:32 PM  [place], [place]

## 2019-03-06 NOTE — Progress Notes (Signed)
1420: received call from HD nurse, pt has excessive bleeding through dressing, will come up with clamp applied. 1430: pt back up to 2A, c/o headache and has clamp applied to HD access site, gave pt BP medication that was held, removed clamped and saturated gauze and applied new gauze with tape w/o clamp, no excess bleeding noted, MD notified

## 2019-03-06 NOTE — Progress Notes (Signed)
Hd tx started   03/06/19 0950  Vital Signs  Pulse Rate 93  Resp (!) 30  BP (!) 185/110  Oxygen Therapy  SpO2 97 %  O2 Device Room Air  During Hemodialysis Assessment  Blood Flow Rate (mL/min) 400 mL/min  Arterial Pressure (mmHg) -120 mmHg  Venous Pressure (mmHg) 120 mmHg  Transmembrane Pressure (mmHg) 80 mmHg  Ultrafiltration Rate (mL/min) 1000 mL/min  Dialysate Flow Rate (mL/min) 600 ml/min  Conductivity: Machine  14  HD Safety Checks Performed Yes  Intra-Hemodialysis Comments Progressing as prescribed

## 2019-03-06 NOTE — Progress Notes (Signed)
Established hemodialysis patient known at [facility] [name]) MWF 2:30, patient self transports. Patient has no dialysis concerns this admission.

## 2019-03-06 NOTE — Progress Notes (Signed)
Unable to get a REDs reading due to low probability.

## 2019-03-06 NOTE — Progress Notes (Signed)
Pre HD Tx Note  Pt arrived from his room to receive HD Tx. Pt is currently on RA SPO2 97%. BP is high but not different from baseline. Tx should run for 3hrs on 2K2.5Ca. AVF WDL to start   03/06/19 0940  Hand-Off documentation  Report given to (Full Name) Newt Minion RN   Report received from (Full Name) Darla Lesches RN  Vital Signs  Temp 98.4 F (36.9 C)  Temp Source Oral  Pulse Rate 90  Pulse Rate Source Monitor  Resp 12  BP (!) 190/104  BP Location Right Arm  BP Method Automatic  Patient Position (if appropriate) Lying  Oxygen Therapy  SpO2 97 %  O2 Device Room Air  Pain Assessment  Pain Scale 0-10  Pain Score 0  Time-Out for Hemodialysis  What Procedure? HD  Pt Identifiers(min of two) First/Last Name;MRN/Account#  Correct Site? Yes  Correct Side? Yes  Correct Procedure? Yes  Rad Studies Available? Yes  Safety Precautions Reviewed? Yes  Engineer, civil (consulting) Number 1  Station Number 1  UF/Alarm Test Passed  Conductivity: Meter 14  Conductivity: Machine  14  pH 7.6  Reverse Osmosis Main  Normal Saline Lot Number [id number]  Dialyzer Lot Number [id number]  Disposable Set Lot Number [id number]  Machine Temperature 98.6 F (37 C)  Pre Treatment Patient Checks  Vascular access used during treatment Fistula  HD catheter dressing before treatment WDL  Patient is receiving dialysis in a chair  (in bed)  Hepatitis B Surface Antigen Results Negative  Date Hepatitis B Surface Antigen Drawn 02/10/19  Hepatitis B Surface Antibody  (<10)  Date Hepatitis B Surface Antibody Drawn 02/10/19  Hemodialysis Consent Verified Yes  Hemodialysis Standing Orders Initiated Yes  ECG (Telemetry) Monitor On Yes  Prime Ordered Normal Saline  Length of  DialysisTreatment -hour(s) 3.5 Hour(s)  Dialysis Treatment Comments  (Na140)  Dialyzer Elisio 17H NR  Dialysate 2K;2.5 Ca  Dialysis Anticoagulant None  Blood Flow Rate Ordered 400 mL/min  Ultrafiltration Goal 3 Liters  Education /  Care Plan  Dialysis Education Provided Yes  Documented Education in Care Plan Yes  Fistula / Graft Left Forearm  No Placement Date or Time found.   Placed prior to admission: Yes  Orientation: Left  Access Location: Forearm  Site Condition No complications  Fistula / Graft Assessment Thrill;Bruit;Present  Status Accessed  Needle Size 15  Drainage Description None   Tx

## 2019-03-06 NOTE — Discharge Summary (Signed)
Physician Discharge Summary  Louis [name] [id number] DOB: 1960/02/18 DOA: 03/05/2019  PCP: Steele Sizer, MD  Admit date: 03/05/2019 Discharge date: 03/06/2019  Admitted From: Home Disposition: Home  Recommendations for Outpatient Follow-up:  1. Follow up with PCP in 1-2 weeks 2. Please obtain BMP/CBC in one week 3. Follow-up with nephrology as an outpatient for dialysis   Discharge Condition: Stable CODE STATUS: Full code Diet recommendation: Heart healthy  Brief/Interim Summary: 59 year old male with a history of hypertension, HIV, diastolic heart failure, end-stage renal disease on hemodialysis, presents to the emergency room with chest discomfort, headache and elevated blood pressure.  He had missed his last 2 dialysis sessions on Monday and Wednesday.  On arrival to the emergency room he was noted to have hyperkalemia with potassium 6.4.  Blood pressure elevated at 186/113.  He was not in any distress.  He was admitted to the hospital overnight and was seen by nephrology in the next morning.  He underwent dialysis with removal of 3 L of fluid.  Patient felt improved after treatment and was not having any further chest pain and had no shortness of breath.  He did have some bleeding from his dialysis access, but this resolved after pressure was applied with clamp.  He did not have any further bleeding at the time of discharge.  He has been cleared for discharge by nephrology who will follow him up in the outpatient setting.  Discharge Diagnoses:  Principal Problem:   Hypertensive urgency Active Problems:   Human immunodeficiency virus (HIV) disease ([place])   Rosacea   Neck muscle spasm   ESRD on dialysis ([place])   Chronic gouty arthropathy without tophi   History of anemia due to CKD   Mild protein-calorie malnutrition ([facility])   Prolonged Q-T interval on ECG    Discharge Instructions  Discharge Instructions    Diet - low sodium heart healthy   Complete by: As directed     Increase activity slowly   Complete by: As directed      Allergies as of 03/06/2019      Reactions   Losartan Swelling   Lip swelling   Quinapril Hcl Swelling   Face swells      Medication List    STOP taking these medications   spironolactone 25 MG tablet Commonly known as: ALDACTONE     TAKE these medications   amLODipine 10 MG tablet Commonly known as: NORVASC Take 1 tablet (10 mg total) by mouth daily.   Anoro Ellipta 62.5-25 MCG/INH Aepb Generic drug: umeclidinium-vilanterol TAKE 1 PUFF BY MOUTH EVERY DAY   Auryxia 1 GM 210 MG(Fe) tablet Generic drug: ferric citrate Take 1 tablet by mouth 4 (four) times daily -  before meals and at bedtime.   benzonatate 100 MG capsule Commonly known as: TESSALON Take 1 capsule (100 mg total) by mouth 3 (three) times daily as needed for cough.   carvedilol 25 MG tablet Commonly known as: COREG Take 1 tablet (25 mg total) by mouth 2 (two) times daily.   cetirizine 10 MG tablet Commonly known as: ZYRTEC Take 1 tablet (10 mg total) by mouth daily.   cloNIDine 0.2 mg/24hr patch Commonly known as: CATAPRES - Dosed in mg/24 hr Place 1 patch (0.2 mg total) onto the skin once a week.   Descovy 200-25 MG tablet Generic drug: emtricitabine-tenofovir AF Take 1 tablet by mouth daily.   diclofenac sodium 1 % Gel Commonly known as: VOLTAREN Apply 2 g topically 4 (four) times daily.  doxazosin 2 MG tablet Commonly known as: Cardura Take 1 tablet (2 mg total) by mouth 2 (two) times daily.   Febuxostat 80 MG Tabs Take 1 tablet by mouth daily.   ferrous sulfate 325 (65 FE) MG tablet Take 1 tablet by mouth daily.   furosemide 40 MG tablet Commonly known as: LASIX Take 80 mg by mouth daily.   halobetasol 0.05 % cream Commonly known as: ULTRAVATE APPLY TO AFFECTED AREA TWICE A DAY   hydrALAZINE 100 MG tablet Commonly known as: APRESOLINE Take 100 mg by mouth 3 (three) times daily.   methocarbamol 500 MG  tablet Commonly known as: ROBAXIN Take 500 mg by mouth 2 (two) times daily as needed. for pain   predniSONE 10 MG tablet Commonly known as: DELTASONE Take 1 tablet (10 mg total) by mouth 2 (two) times daily as needed. Prn gout attack for 2 days   Rena-Vite Rx 1 MG Tabs Take 1 tablet by mouth daily.   Spiriva Respimat 1.25 MCG/ACT Aers Generic drug: Tiotropium Bromide Monohydrate Inhale 2 puffs into the lungs daily.   Tivicay 50 MG tablet Generic drug: dolutegravir Take 1 tablet by mouth daily.   valACYclovir 500 MG tablet Commonly known as: VALTREX Take 500 mg by mouth daily as needed (for flare ups).       Allergies  Allergen Reactions  . Losartan Swelling    Lip swelling  . Quinapril Hcl Swelling    Face swells    Consultations:  Nephrology   Procedures/Studies: DG Chest 2 View  Result Date: 03/05/2019 CLINICAL DATA:  Hypertension EXAM: CHEST - 2 VIEW COMPARISON:  12/03/2018 FINDINGS: The heart size is mildly enlarged, stable. No focal airspace consolidation, pleural effusion, or pneumothorax. Prior left seventh rib fracture. IMPRESSION: No active cardiopulmonary disease. Electronically Signed   By: Davina Poke D.O.   On: 03/05/2019 15:25   CT Chest High Resolution  Result Date: 02/18/2019 CLINICAL DATA:  59 year old male with shortness of breath. Evaluate for interstitial lung disease. EXAM: CT CHEST WITHOUT CONTRAST TECHNIQUE: Multidetector CT imaging of the chest was performed following the standard protocol without intravenous contrast. High resolution imaging of the lungs, as well as inspiratory and expiratory imaging, was performed. COMPARISON:  No priors. FINDINGS: Cardiovascular: Heart size is mildly enlarged. There is no significant pericardial fluid, thickening or pericardial calcification. There is aortic atherosclerosis, as well as atherosclerosis of the great vessels of the mediastinum and the coronary arteries, including calcified atherosclerotic  plaque in the left anterior descending, left circumflex and right coronary arteries. Mediastinum/Nodes: No pathologically enlarged mediastinal or hilar lymph nodes. Please note that accurate exclusion of hilar adenopathy is limited on noncontrast CT scans. Esophagus is unremarkable in appearance. No axillary lymphadenopathy. Lungs/Pleura: High-resolution images demonstrate mild areas of peripheral predominant ground-glass attenuation and mild septal thickening in the lungs bilaterally where there is also parenchymal banding. These findings are most evident in the lower lobes and are largely but not completely resolved with prone imaging. No traction bronchiectasis or honeycombing. Inspiratory and expiratory imaging is unremarkable. Mild diffuse bronchial wall thickening with mild centrilobular emphysema. No acute consolidative airspace disease. No pleural effusions. 9 x 5 mm right lower lobe nodule (axial image 129 of series 9). No other larger more suspicious appearing pulmonary nodules or masses are noted. Upper Abdomen: Unremarkable. Musculoskeletal: There are no aggressive appearing lytic or blastic lesions noted in the visualized portions of the skeleton. IMPRESSION: 1. There is a spectrum of findings which could be consistent with early or  mild interstitial lung disease, at this time considered indeterminate for usual interstitial pneumonia (UIP) per current ATS guidelines. If there is persistent clinical concern for interstitial lung disease, repeat high-resolution chest CT would be recommended in 12 months to assess for temporal changes in the appearance of the lung parenchyma. 2. Mild diffuse bronchial wall thickening with mild centrilobular emphysema. 3. Right lower lobe pulmonary nodule measuring 9 x 5 mm (mean diameter of 7 mm), favored to represent a subpleural lymph node, but nonspecific. Attention at time of repeat high-resolution chest CT is recommended to ensure stability. 4. Mild cardiomegaly. 5.  Aortic atherosclerosis, in addition to 3 vessel coronary artery disease. Please note that although the presence of coronary artery calcium documents the presence of coronary artery disease, the severity of this disease and any potential stenosis cannot be assessed on this non-gated CT examination. Assessment for potential risk factor modification, dietary therapy or pharmacologic therapy may be warranted, if clinically indicated. Aortic Atherosclerosis (ICD10-I70.0) and Emphysema (ICD10-J43.9). Electronically Signed   By: Vinnie Langton M.D.   On: 02/18/2019 16:46   Pulmonary Function Test [facility] Only  Result Date: 02/21/2019 Spirometry Data Is Acceptable and Reproducible Moderate Restrictive Lung  Disease without  Significant Broncho-Dilator Response Consider outpatient Pulmonary Consultation if needed Clinical Correlation Advised       Subjective: No chest pain or shortness of breath.  Patient was noted to have bleeding from his dialysis access.  He bled through several gauze and clamp was placed.  Once clamp was removed, bleeding appeared to have resolved.  He was monitored for approximately an hour after bleeding resolved and did not have any recurrence.  Discharge Exam: Vitals:   03/06/19 1315 03/06/19 1330 03/06/19 1345 03/06/19 1446  BP: (!) 187/108 (!) 189/108 (!) 176/105 (!) 181/104  Pulse: 88 88 92 92  Resp: (!) 24 (!) 24 (!) 25 15  Temp:   98.4 F (36.9 C) 98.5 F (36.9 C)  TempSrc:   Oral Oral  SpO2: 98% 98% 97% 95%  Weight:      Height:        General: Pt is alert, awake, not in acute distress Cardiovascular: RRR, S1/S2 +, no rubs, no gallops Respiratory: CTA bilaterally, no wheezing, no rhonchi Abdominal: Soft, NT, ND, bowel sounds + Extremities: no edema, no cyanosis    The results of significant diagnostics from this hospitalization (including imaging, microbiology, ancillary and laboratory) are listed below for reference.     Microbiology: Recent Results (from  the past 240 hour(s))  Respiratory Panel by RT PCR (Flu A&B, Covid) - Nasopharyngeal Swab     Status: None   Collection Time: 03/05/19  9:07 PM   Specimen: Nasopharyngeal Swab  Result Value Ref Range Status   SARS Coronavirus 2 by RT PCR NEGATIVE NEGATIVE Final    Comment: (NOTE) SARS-CoV-2 target nucleic acids are NOT DETECTED. The SARS-CoV-2 RNA is generally detectable in upper respiratoy specimens during the acute phase of infection. The lowest concentration of SARS-CoV-2 viral copies this assay can detect is 131 copies/mL. A negative result does not preclude SARS-Cov-2 infection and should not be used as the sole basis for treatment or other patient management decisions. A negative result may occur with  improper specimen collection/handling, submission of specimen other than nasopharyngeal swab, presence of viral mutation(s) within the areas targeted by this assay, and inadequate number of viral copies (<131 copies/mL). A negative result must be combined with clinical observations, patient history, and epidemiological information. The expected result is Negative.  Fact Sheet for Patients:  PinkCheek.be Fact Sheet for Healthcare Providers:  GravelBags.it This test is not yet ap proved or cleared by the [place] FDA and  has been authorized for detection and/or diagnosis of SARS-CoV-2 by FDA under an Emergency Use Authorization (EUA). This EUA will remain  in effect (meaning this test can be used) for the duration of the COVID-19 declaration under Section 564(b)(1) of the Act, 21 U.S.C. section 360bbb-3(b)(1), unless the authorization is terminated or revoked sooner.    Influenza A by PCR NEGATIVE NEGATIVE Final   Influenza B by PCR NEGATIVE NEGATIVE Final    Comment: (NOTE) The Xpert Xpress SARS-CoV-2/FLU/RSV assay is intended as an aid in  the diagnosis of influenza from Nasopharyngeal swab specimens and  should  not be used as a sole basis for treatment. Nasal washings and  aspirates are unacceptable for Xpert Xpress SARS-CoV-2/FLU/RSV  testing. Fact Sheet for Patients: PinkCheek.be Fact Sheet for Healthcare Providers: GravelBags.it This test is not yet approved or cleared by the [place] FDA and  has been authorized for detection and/or diagnosis of SARS-CoV-2 by  FDA under an Emergency Use Authorization (EUA). This EUA will remain  in effect (meaning this test can be used) for the duration of the  Covid-19 declaration under Section 564(b)(1) of the Act, 21  U.S.C. section 360bbb-3(b)(1), unless the authorization is  terminated or revoked. Performed at [facility], [address] [place], [place] [address]   MRSA PCR Screening     Status: None   Collection Time: 03/05/19 10:16 PM   Specimen: Nasopharyngeal  Result Value Ref Range Status   MRSA by PCR NEGATIVE NEGATIVE Final    Comment:        The GeneXpert MRSA Assay (FDA approved for NASAL specimens only), is one component of a comprehensive MRSA colonization surveillance program. It is not intended to diagnose MRSA infection nor to guide or monitor treatment for MRSA infections. Performed at [facility], [address] [place], [place] [address]      Labs: BNP (last 3 results) Recent Labs    05/01/18 1120 05/24/18 1534  BNP 2,358* [id number]*   Basic Metabolic Panel: Recent Labs  Lab 03/05/19 1548 03/05/19 2119 03/06/19 0413  NA 142 141 140  K 6.2* 6.4* 5.7*  CL 103 103 102  CO2 23 20* 22  GLUCOSE 91 91 97  BUN 99* 98* 98*  CREATININE 20.01* 19.85* 20.08*  CALCIUM 9.4 9.6 9.7  MG  --   --  2.4   Liver Function Tests: Recent Labs  Lab 03/06/19 0413  AST 18  ALT 10  ALKPHOS 50  BILITOT 1.2  PROT 7.6  ALBUMIN 4.0   No results for input(s): LIPASE, AMYLASE in the last 168 hours. No results for input(s): AMMONIA in the last  168 hours. CBC: Recent Labs  Lab 03/05/19 1548 03/06/19 0413  WBC 5.3 6.8  HGB 9.6* 9.7*  HCT 30.0* 28.6*  MCV 105.3* 104.8*  PLT 134* 120*   Cardiac Enzymes: No results for input(s): CKTOTAL, CKMB, CKMBINDEX, TROPONINI in the last 168 hours. BNP: Invalid input(s): POCBNP CBG: No results for input(s): GLUCAP in the last 168 hours. D-Dimer No results for input(s): DDIMER in the last 72 hours. Hgb A1c No results for input(s): HGBA1C in the last 72 hours. Lipid Profile No results for input(s): CHOL, HDL, LDLCALC, TRIG, CHOLHDL, LDLDIRECT in the last 72 hours. Thyroid function studies No results for input(s): TSH, T4TOTAL, T3FREE, THYROIDAB in the  last 72 hours.  Invalid input(s): FREET3 Anemia work up No results for input(s): VITAMINB12, FOLATE, FERRITIN, TIBC, IRON, RETICCTPCT in the last 72 hours. Urinalysis    Component Value Date/Time   COLORURINE YELLOW (A) 10/08/2016 1002   APPEARANCEUR CLEAR (A) 10/08/2016 1002   LABSPEC 1.009 10/08/2016 1002   PHURINE 5.0 10/08/2016 1002   GLUCOSEU NEGATIVE 10/08/2016 1002   HGBUR SMALL (A) 10/08/2016 1002   BILIRUBINUR NEGATIVE 10/08/2016 1002   KETONESUR NEGATIVE 10/08/2016 1002   PROTEINUR 100 (A) 10/08/2016 1002   UROBILINOGEN 0.2 06/09/2010 1518   NITRITE NEGATIVE 10/08/2016 1002   LEUKOCYTESUR NEGATIVE 10/08/2016 1002   Sepsis Labs Invalid input(s): PROCALCITONIN,  WBC,  LACTICIDVEN Microbiology Recent Results (from the past 240 hour(s))  Respiratory Panel by RT PCR (Flu A&B, Covid) - Nasopharyngeal Swab     Status: None   Collection Time: 03/05/19  9:07 PM   Specimen: Nasopharyngeal Swab  Result Value Ref Range Status   SARS Coronavirus 2 by RT PCR NEGATIVE NEGATIVE Final    Comment: (NOTE) SARS-CoV-2 target nucleic acids are NOT DETECTED. The SARS-CoV-2 RNA is generally detectable in upper respiratoy specimens during the acute phase of infection. The lowest concentration of SARS-CoV-2 viral copies this assay can  detect is 131 copies/mL. A negative result does not preclude SARS-Cov-2 infection and should not be used as the sole basis for treatment or other patient management decisions. A negative result may occur with  improper specimen collection/handling, submission of specimen other than nasopharyngeal swab, presence of viral mutation(s) within the areas targeted by this assay, and inadequate number of viral copies (<131 copies/mL). A negative result must be combined with clinical observations, patient history, and epidemiological information. The expected result is Negative. Fact Sheet for Patients:  PinkCheek.be Fact Sheet for Healthcare Providers:  GravelBags.it This test is not yet ap proved or cleared by the [place] FDA and  has been authorized for detection and/or diagnosis of SARS-CoV-2 by FDA under an Emergency Use Authorization (EUA). This EUA will remain  in effect (meaning this test can be used) for the duration of the COVID-19 declaration under Section 564(b)(1) of the Act, 21 U.S.C. section 360bbb-3(b)(1), unless the authorization is terminated or revoked sooner.    Influenza A by PCR NEGATIVE NEGATIVE Final   Influenza B by PCR NEGATIVE NEGATIVE Final    Comment: (NOTE) The Xpert Xpress SARS-CoV-2/FLU/RSV assay is intended as an aid in  the diagnosis of influenza from Nasopharyngeal swab specimens and  should not be used as a sole basis for treatment. Nasal washings and  aspirates are unacceptable for Xpert Xpress SARS-CoV-2/FLU/RSV  testing. Fact Sheet for Patients: PinkCheek.be Fact Sheet for Healthcare Providers: GravelBags.it This test is not yet approved or cleared by the [place] FDA and  has been authorized for detection and/or diagnosis of SARS-CoV-2 by  FDA under an Emergency Use Authorization (EUA). This EUA will remain  in effect (meaning  this test can be used) for the duration of the  Covid-19 declaration under Section 564(b)(1) of the Act, 21  U.S.C. section 360bbb-3(b)(1), unless the authorization is  terminated or revoked. Performed at [facility], [address] [place],  [address]   MRSA PCR Screening     Status: None   Collection Time: 03/05/19 10:16 PM   Specimen: Nasopharyngeal  Result Value Ref Range Status   MRSA by PCR NEGATIVE NEGATIVE Final    Comment:        The GeneXpert MRSA Assay (FDA approved  for NASAL specimens only), is one component of a comprehensive MRSA colonization surveillance program. It is not intended to diagnose MRSA infection nor to guide or monitor treatment for MRSA infections. Performed at [facility], [address] [place], [place] [address]      Time coordinating discharge: 58mins  SIGNED:   Kathie Dike, MD  [facility] 03/06/2019, 9:53 PM   If 7PM-7AM, please contact night-coverage [email or web address]

## 2019-03-06 NOTE — Progress Notes (Signed)
HD Tx Completed   03/06/19 1330  Vital Signs  Pulse Rate 88  Resp (!) 24  BP (!) 189/108  Oxygen Therapy  SpO2 98 %  O2 Device Room Air  During Hemodialysis Assessment  Blood Flow Rate (mL/min) 400 mL/min  Arterial Pressure (mmHg) -100 mmHg  Venous Pressure (mmHg) 150 mmHg  Transmembrane Pressure (mmHg) 80 mmHg  Ultrafiltration Rate (mL/min) 1000 mL/min  Dialysate Flow Rate (mL/min) 600 ml/min  Conductivity: Machine  14  HD Safety Checks Performed Yes  Intra-Hemodialysis Comments Tx completed;Tolerated well

## 2019-03-06 NOTE — Progress Notes (Signed)
Post HD Tx Note Pt tolerated well the HD Tx. Prescribed UG Goal of 3.5L was met. Pt continues to be on RA SPO2 97%. Pt 's AVF had prolonged bleeding more than 25 min. Primary RN has been notified to monitor access upon Pt's return. Pt's BP continues to be elevated, Primary RN made notified to administer BP meds, Nephrologist has been notified   03/06/19 1345  Hand-Off documentation  Report given to (Full Name) Darla Lesches RN   Report received from (Full Name) Newt Minion   Vital Signs  Temp 98.4 F (36.9 C)  Temp Source Oral  Pulse Rate 92  Resp (!) 25  BP (!) 176/105  Oxygen Therapy  SpO2 97 %  O2 Device Room Air  Post-Hemodialysis Assessment  Rinseback Volume (mL) 250 mL  KECN 81.3 V  Dialyzer Clearance Lightly streaked  Duration of HD Treatment -hour(s) 3.5 hour(s)  Hemodialysis Intake (mL) 500 mL  UF Total -Machine (mL) 3520 mL  Net UF (mL) 3020 mL  Tolerated HD Treatment Yes  Fistula / Graft Left Forearm  No Placement Date or Time found.   Placed prior to admission: Yes  Orientation: Left  Access Location: Forearm  Site Condition Bleeding  Fistula / Graft Assessment Thrill;Bruit;Present  Status Deaccessed  Needle Size 15  Drainage Description None   of access prolonged bleeding

## 2019-03-06 NOTE — H&P (Signed)
History and Physical    Louis [name] [id number] DOB: Jun 23, 1960 DOA: 03/05/2019  PCP: Steele Sizer, MD    Patient coming from: Home    Chief Complaint: Chest pressure, mild headache elevated blood pressure  HPI: Louis [name] is a 59 y.o. male with medical history significant of hypertension, HIV, CHF diastolic dysfunction, came with a chief complaint chest discomfort, elevated blood pressure and mild headache.  Patient denies any vomiting blurry vision focal neurologic deficit.  Patient dialyzes Monday Wednesday and Friday and last dialyzed was last Friday.  He missed dialysis Monday and Wednesday  ED Course:  In no distress Elevated blood pressure 186/113 Received labetalol IV Blood work showed potassium 6.4 Treated in the emergency room.  Hyperkalemia protocol  Review of Systems: As per HPI otherwise 10 point review of systems negative.    Past Medical History:  Diagnosis Date  . Chronic kidney disease    esrd  . Dermatophytosis of foot 06/23/2004  . Environmental allergies   . Gout   . HIV infection ([place]) 12/1995  . Hypertension   . Seborrhea 06/23/2004    Past Surgical History:  Procedure Laterality Date  . CAPD INSERTION N/A 12/28/2016   Procedure: LAPAROSCOPIC INSERTION CONTINUOUS AMBULATORY PERITONEAL DIALYSIS  (CAPD) CATHETER;  Surgeon: Algernon Huxley, MD;  Location: [facility] ORS;  Service: Vascular;  Laterality: N/A;  . DIALYSIS/PERMA CATHETER INSERTION N/A 10/09/2016   Procedure: DIALYSIS/PERMA CATHETER INSERTION;  Surgeon: Algernon Huxley, MD;  Location: [place] [facility];  Service: Cardiovascular;  Laterality: N/A;  . DIALYSIS/PERMA CATHETER REMOVAL N/A 02/19/2017   Procedure: DIALYSIS/PERMA CATHETER REMOVAL;  Surgeon: Algernon Huxley, MD;  Location: [place] [facility];  Service: Cardiovascular;  Laterality: N/A;  . HERNIA REPAIR Bilateral    inguinal     reports that he has never smoked. He has never used smokeless tobacco. He reports previous  alcohol use of about 2.0 standard drinks of alcohol per week. He reports that he does not use drugs.  Allergies  Allergen Reactions  . Losartan Swelling    Lip swelling  . Quinapril Hcl Swelling    Face swells    Family History  Problem Relation Age of Onset  . Bradycardia Maternal Grandmother        with pacemaker     Prior to Admission medications   Medication Sig Start Date End Date Taking? Authorizing Provider  amLODipine (NORVASC) 10 MG tablet Take 1 tablet (10 mg total) by mouth daily. 08/28/16  Yes Dustin Flock, MD  AURYXIA 1 GM 210 MG(Fe) tablet Take 1 tablet by mouth 4 (four) times daily -  before meals and at bedtime. 04/18/18  Yes [provider]  B Complex-C-Folic Acid (RENA-VITE RX) 1 MG TABS Take 1 tablet by mouth daily. 10/02/17  Yes [provider]  carvedilol (COREG) 25 MG tablet Take 1 tablet (25 mg total) by mouth 2 (two) times daily. 01/02/19 04/02/19 Yes Agbor-Etang, Aaron Edelman, MD  cloNIDine (CATAPRES - DOSED IN MG/24 HR) 0.2 mg/24hr patch Place 1 patch (0.2 mg total) onto the skin once a week. 03/03/19  Yes Agbor-Etang, Aaron Edelman, MD  DESCOVY 200-25 MG tablet Take 1 tablet by mouth daily. 04/22/17  Yes [provider]  doxazosin (CARDURA) 2 MG tablet Take 1 tablet (2 mg total) by mouth 2 (two) times daily. 03/04/19  Yes Kate Sable, MD  Febuxostat 80 MG TABS Take 1 tablet by mouth daily. 05/13/18  Yes [provider]  ferrous sulfate 325 (65 FE) MG  tablet Take 1 tablet by mouth daily.   Yes [provider]  furosemide (LASIX) 40 MG tablet Take 80 mg by mouth daily.  12/06/16  Yes [provider]  halobetasol (ULTRAVATE) 0.05 % cream APPLY TO AFFECTED AREA TWICE A DAY 08/21/18  Yes [provider]  methocarbamol (ROBAXIN) 500 MG tablet Take 500 mg by mouth 2 (two) times daily as needed. for pain 04/23/18  Yes [provider]  spironolactone (ALDACTONE) 25 MG tablet Take 1 tablet (25 mg total) by mouth  daily. 01/03/19 04/03/19 Yes Agbor-Etang, Aaron Edelman, MD  Tiotropium Bromide Monohydrate (SPIRIVA RESPIMAT) 1.25 MCG/ACT AERS Inhale 2 puffs into the lungs daily. 02/25/19  Yes Brand Males, MD  TIVICAY 50 MG tablet Take 1 tablet by mouth daily. 04/22/17  Yes [provider]  Celedonio Miyamoto 62.5-25 MCG/INH AEPB TAKE 1 PUFF BY MOUTH EVERY DAY 06/23/18   Steele Sizer, MD  cetirizine (ZYRTEC) 10 MG tablet Take 1 tablet (10 mg total) by mouth daily. 01/04/18   Steele Sizer, MD  diclofenac sodium (VOLTAREN) 1 % GEL Apply 2 g topically 4 (four) times daily. Patient not taking: Reported on 03/05/2019 09/05/18   Steele Sizer, MD  hydrALAZINE (APRESOLINE) 100 MG tablet Take 100 mg by mouth 3 (three) times daily. 10/28/18   [provider]  predniSONE (DELTASONE) 10 MG tablet Take 1 tablet (10 mg total) by mouth 2 (two) times daily as needed. Prn gout attack for 2 days 12/17/18   Steele Sizer, MD  valACYclovir (VALTREX) 500 MG tablet Take 500 mg by mouth daily as needed (for flare ups).  12/06/16   [provider]    Physical Exam: Vitals:   03/05/19 2230 03/05/19 2253 03/05/19 2343 03/06/19 0108  BP:  (!) 216/107 (!) 209/118 (!) 190/119  Pulse: 80 90 88   Resp:      Temp:  98 F (36.7 C)    TempSrc:  Oral    SpO2: 93% 97%    Weight:  83.2 kg    Height:  6\' 2"  (1.88 m)      Constitutional: NAD, calm, comfortable Vitals:   03/05/19 2230 03/05/19 2253 03/05/19 2343 03/06/19 0108  BP:  (!) 216/107 (!) 209/118 (!) 190/119  Pulse: 80 90 88   Resp:      Temp:  98 F (36.7 C)    TempSrc:  Oral    SpO2: 93% 97%    Weight:  83.2 kg    Height:  6\' 2"  (1.88 m)     Eyes: PERRL, lids and conjunctivae normal ENMT: Mucous membranes are moist. Posterior pharynx clear of any exudate or lesions.Normal dentition.  Neck: normal, supple, no masses, no thyromegaly Respiratory: clear to auscultation bilaterally, no wheezing, no crackles. Normal respiratory effort. No accessory  muscle use.  Cardiovascular: Regular rate and rhythm, no murmurs / rubs / gallops. No extremity edema. 2+ pedal pulses. No carotid bruits.  Abdomen: no tenderness, no masses palpated. No hepatosplenomegaly. Bowel sounds positive.  Musculoskeletal: no clubbing / cyanosis. No joint deformity upper and lower extremities. Good ROM, no contractures. Normal muscle tone.  Skin: no rashes, lesions, ulcers. No induration Neurologic: CN 2-12 grossly intact. Sensation intact, DTR normal. Strength 5/5 in all 4.  Psychiatric: Normal judgment and insight. Alert and oriented x 3. Normal mood.    Labs on Admission: I have personally reviewed following labs and imaging studies  CBC: Recent Labs  Lab 03/05/19 1548  WBC 5.3  HGB 9.6*  HCT 30.0*  MCV  105.3*  PLT [id number]*   Basic Metabolic Panel: Recent Labs  Lab 03/05/19 1548 03/05/19 2119  NA 142 141  K 6.2* 6.4*  CL 103 103  CO2 23 20*  GLUCOSE 91 91  BUN 99* 98*  CREATININE 20.01* 19.85*  CALCIUM 9.4 9.6   GFR: Estimated Creatinine Clearance: 4.8 mL/min (A) (by C-G formula based on SCr of 19.85 mg/dL (H)). Liver Function Tests: No results for input(s): AST, ALT, ALKPHOS, BILITOT, PROT, ALBUMIN in the last 168 hours. No results for input(s): LIPASE, AMYLASE in the last 168 hours. No results for input(s): AMMONIA in the last 168 hours. Coagulation Profile: No results for input(s): INR, PROTIME in the last 168 hours. Cardiac Enzymes: No results for input(s): CKTOTAL, CKMB, CKMBINDEX, TROPONINI in the last 168 hours. BNP (last 3 results) No results for input(s): PROBNP in the last 8760 hours. HbA1C: No results for input(s): HGBA1C in the last 72 hours. CBG: No results for input(s): GLUCAP in the last 168 hours. Lipid Profile: No results for input(s): CHOL, HDL, LDLCALC, TRIG, CHOLHDL, LDLDIRECT in the last 72 hours. Thyroid Function Tests: No results for input(s): TSH, T4TOTAL, FREET4, T3FREE, THYROIDAB in the last 72 hours. Anemia  Panel: No results for input(s): VITAMINB12, FOLATE, FERRITIN, TIBC, IRON, RETICCTPCT in the last 72 hours. Urine analysis:    Component Value Date/Time   COLORURINE YELLOW (A) 10/08/2016 1002   APPEARANCEUR CLEAR (A) 10/08/2016 1002   LABSPEC 1.009 10/08/2016 1002   PHURINE 5.0 10/08/2016 1002   GLUCOSEU NEGATIVE 10/08/2016 1002   HGBUR SMALL (A) 10/08/2016 1002   BILIRUBINUR NEGATIVE 10/08/2016 1002   KETONESUR NEGATIVE 10/08/2016 1002   PROTEINUR 100 (A) 10/08/2016 1002   UROBILINOGEN 0.2 06/09/2010 1518   NITRITE NEGATIVE 10/08/2016 1002   LEUKOCYTESUR NEGATIVE 10/08/2016 1002    Radiological Exams on Admission: DG Chest 2 View  Result Date: 03/05/2019 CLINICAL DATA:  Hypertension EXAM: CHEST - 2 VIEW COMPARISON:  12/03/2018 FINDINGS: The heart size is mildly enlarged, stable. No focal airspace consolidation, pleural effusion, or pneumothorax. Prior left seventh rib fracture. IMPRESSION: No active cardiopulmonary disease. Electronically Signed   By: Davina Poke D.O.   On: 03/05/2019 15:25    EKG: Independently reviewed.  Sinus no acute ST-T changes  Assessment/Plan Principal Problem:   Hypertensive urgency Active Problems:   Human immunodeficiency virus (HIV) disease (HCC)   Rosacea   Neck muscle spasm   ESRD on dialysis (HCC)   Chronic gouty arthropathy without tophi   History of anemia due to CKD   Mild protein-calorie malnutrition (HCC)   Prolonged Q-T interval on ECG   Assessment plan  Hypertensive urgency 186/113 No shortness of breath some chest pain mild headache no vomiting no blurred vision Plan labetalol and hydralazine IV as needed, Lasix IV 80 mg Plan dialysis in the morning; emergency room physician discussed the case with nephrologist to keep him observation and dialysis in the morning Resume home medication for blood pressure Change clonidine patch 0.2 to-0.3 If blood pressure continues to be very high or patient is symptomatic Transfer to  stepdown or ICU for labetalol or Cardene drips On-call nephrology  End-stage renal disease On hemodialysis Monday Wednesday and Friday Missed dialysis today Plan to be dialyzed in the morning  Hyperkalemia Potassium 6.4 Was given hyperkalemia protocol by emergency room physician With repeat potassium in 4 hours  HIV Resume home medication    DVT prophylaxis: heparine Code Status: Full code Family Communication: No family at bedside Disposition Plan: Admit observation  on dialysis Consults called: By emergency room physician Admission status: Observation   Berlin Viereck G Donathan Buller MD [facility]  If 7PM-7AM, please contact night-coverage [email or web address]   03/06/2019, 1:53 AM

## 2019-03-06 NOTE — Telephone Encounter (Signed)
Pt stated he is currently at [facility] ED and has a cough. He needs Dr. Ancil Boozer to send rx to [place] for a cough medication he stated she prescribed to him the last time he had pneumonia. Stated the medications he received in the ED are not working. Please advise.

## 2019-03-07 NOTE — Telephone Encounter (Signed)
Patient calling back in to discuss appointment details  Please advise

## 2019-03-07 NOTE — Telephone Encounter (Addendum)
Spoke with the patient and made him aware of Dr. Thereasa Solo recommendation stated in the documentation below. Patient verbalized understanding and has no question or concerns at this time.

## 2019-03-14 ENCOUNTER — Ambulatory Visit: Payer: PRIVATE HEALTH INSURANCE | Admitting: Cardiovascular Disease

## 2019-03-16 ENCOUNTER — Encounter
Admit: 2019-03-16 | Discharge: 2019-03-17 | Disposition: A | Discharge status: Home / Self Care | Payer: MEDICARE | Attending: Emergency Medicine

## 2019-03-16 DIAGNOSIS — I10 Essential (primary) hypertension: Principal | ICD-10-CM

## 2019-03-17 ENCOUNTER — Telehealth: Payer: Self-pay | Admitting: Internal Medicine

## 2019-03-17 DIAGNOSIS — R05 Cough: Secondary | ICD-10-CM

## 2019-03-17 DIAGNOSIS — R059 Cough, unspecified: Secondary | ICD-10-CM

## 2019-03-17 NOTE — Telephone Encounter (Signed)
Called and spoke with pt. Asked pt if he was covid tested when went to ER at [facility] and pt stated he did not. Pt said he was recently tested 1/20 which was flu A&B and covid lab test and the results came back negative.   MR, do we still need to have pt get re-tested for covid?   I also stated to pt that there are some lab tests that need to be done and also mentioned to pt that we saw he did not get labs done after last OV with MR and pt said he thought MR was going to discuss with another one of his MD's about having the labs done at dialysis office or possibly having labs done when he was recently in the hospital.  Pt said the reason why he went to [facility] ER was due to potentially wanting a second opinion and that is why he did ask to be referred to [facility] Pulmonology as he is considering to have them take over his care.  MR, please advise on the above.

## 2019-03-17 NOTE — Telephone Encounter (Signed)
Ok if he just had a covid test on1/20 unlikely currently has covid but still possible  Plan  - stil recommend the blood work I recommended - also recommend blood IgM for covid and IgG for covid  -he can choose to all of above with [facility] Pulmonary and transfer care there  - if he does not want to be seen by [place] then instead of doing all this blood work with [place] he should just start with [facility] pulmonary  - also suggest his cough be addressed by his PCP Steele Sizer, MD

## 2019-03-17 NOTE — Telephone Encounter (Signed)
At his visit 02/21/2019 - he was suppsoed to do  Serum: ESR, ACE, ANA, DS-DNA, RF, anti-CCP,  ANCA screen, MPO, PR-3, Total CK,  Aldolase,   -> scl-70, ssA, ssB, anti-RNP, anti-JO-1, Hypersensitivity Pneumonitis Panel - > but I do not see it. Please get this done  Also, did they covid test him ? -Review of [facility] notes indicate they did not Covid testing.  The chest x-ray did suggest some evolving pneumonia  Plan -He needs to do drive-through Covid testing -Also do Covid blood IgM and blood Covid IgG -Also do the above hypersensitivity pneumonitis profile and other autoimmune antibodies as above -He does not need a pulmonologist in [facility] ER doc try to refer him to a [facility] pulmonologist but he does not need that]  He can do all this in [place] because that is where he lives  - set up a phone visit with an app here or in [place] or with me (03/21/2019 to review)  Thanks    SIGNATURE    Dr. Brand Males, M.D., F.C.C.P,  Pulmonary and Critical Care Medicine Staff Physician, [place] Director - Interstitial Lung Disease  Program  Pulmonary [place] at [place], [place], [address]  Pager: [phone], If no answer or between  15:00h - 7:00h: call [phone] Telephone: [phone]  2:05 PM 03/17/2019     [place] [address]

## 2019-03-17 NOTE — Telephone Encounter (Signed)
Called spoke with patient. He states he went to [facility] emergency room last night for his Blood pressure. While he was there , they did a  Chest xray which showed some pneumonia.  I asked the patient what they sent him home with he states "nothing because I told them I would call y'all in the morning".   Patient having non productive cough, chest tightness, denies fever or body aches.   Patient wants something for his cough   Dr. Chase Caller please advise.

## 2019-03-17 NOTE — Telephone Encounter (Signed)
Attempted to call pt to tell him the info stated by MR but unable to reach. Left message for pt to return call.

## 2019-03-18 ENCOUNTER — Other Ambulatory Visit: Payer: Self-pay | Admitting: Internal Medicine

## 2019-03-18 NOTE — Telephone Encounter (Signed)
Copied from [place] [id number]. Topic: General - Other >> Mar 18, 2019  9:30 AM Leward Quan A wrote: Reason for CRM: Patient called to inform Dr Ancil Boozer that he was told by his Pulmonologist to inform her that his cough came back and that they say something in his chest from the Xrays. Patient can be reached at Ph# [phone]

## 2019-03-18 NOTE — Telephone Encounter (Signed)
Dr. Chase Caller,  I called the patient back and he stated that the cannot get in to see [facility] Pulmonary, they do not have any appointment available.  He agreed to have labs done, but stated he thought since he was recently in the hospital that he was already tested at that time.  I looked at the "labs" tab and there are multiple labs that were ordered for the patient on 02/24/19 by our [employer] office. Can you please take a look at 14 labs that have "active/future" status.  Please confirm if these are the labs you are already need, it looks like IgG antibody is already listed.  Not sure which one would include the IgM. Thanks.

## 2019-03-18 NOTE — Telephone Encounter (Signed)
Pt is returning call regarding Dr. Golden Pop recommendations - please call back to [phone]

## 2019-03-19 NOTE — Telephone Encounter (Signed)
Called and spoke to pt. Informed him of the recs per MR. Pt is willing to do blood work and states he will go by [facility] today or tomorrow to do the previous blood work MR ordered in addition to the [place] serology. Nothing further needed at this time.

## 2019-03-20 ENCOUNTER — Other Ambulatory Visit
Admission: RE | Admit: 2019-03-20 | Discharge: 2019-03-20 | Disposition: A | Discharge status: Home / Self Care | Payer: PRIVATE HEALTH INSURANCE | Source: Ambulatory Visit | Attending: Internal Medicine | Admitting: Internal Medicine

## 2019-03-20 ENCOUNTER — Ambulatory Visit: Payer: PRIVATE HEALTH INSURANCE | Admitting: Cardiology

## 2019-03-20 DIAGNOSIS — J849 Interstitial pulmonary disease, unspecified: Secondary | ICD-10-CM | POA: Diagnosis not present

## 2019-03-20 DIAGNOSIS — Z0184 Encounter for antibody response examination: Secondary | ICD-10-CM | POA: Diagnosis not present

## 2019-03-20 LAB — SEDIMENTATION RATE: Sed Rate: 52 mm/hr — ABNORMAL HIGH (ref 0–20)

## 2019-03-20 LAB — CK: Total CK: 77 U/L (ref 49–397)

## 2019-03-20 LAB — CKMB (ARMC ONLY): CK, MB: 1.1 ng/mL (ref 0.5–5.0)

## 2019-03-20 NOTE — Addendum Note (Signed)
Addended by: Santiago Bur on: 03/20/2019 09:29 AM   Modules accepted: Orders

## 2019-03-20 NOTE — Addendum Note (Signed)
Addended by: Santiago Bur on: 03/20/2019 09:30 AM   Modules accepted: Orders

## 2019-03-20 NOTE — Addendum Note (Signed)
Addended by: Santiago Bur on: 03/20/2019 09:31 AM   Modules accepted: Orders

## 2019-03-20 NOTE — Addendum Note (Signed)
Addended by: Santiago Bur on: 03/20/2019 09:28 AM   Modules accepted: Orders

## 2019-03-21 ENCOUNTER — Telehealth: Payer: Self-pay

## 2019-03-21 ENCOUNTER — Encounter: Payer: Self-pay | Admitting: Internal Medicine

## 2019-03-21 ENCOUNTER — Encounter: Payer: Self-pay | Admitting: Cardiology

## 2019-03-21 ENCOUNTER — Ambulatory Visit: Payer: PRIVATE HEALTH INSURANCE | Admitting: Internal Medicine

## 2019-03-21 ENCOUNTER — Ambulatory Visit (INDEPENDENT_AMBULATORY_CARE_PROVIDER_SITE_OTHER): Payer: PRIVATE HEALTH INSURANCE | Admitting: Internal Medicine

## 2019-03-21 DIAGNOSIS — J440 Chronic obstructive pulmonary disease with acute lower respiratory infection: Secondary | ICD-10-CM

## 2019-03-21 DIAGNOSIS — R0600 Dyspnea, unspecified: Secondary | ICD-10-CM

## 2019-03-21 DIAGNOSIS — J849 Interstitial pulmonary disease, unspecified: Secondary | ICD-10-CM | POA: Diagnosis not present

## 2019-03-21 DIAGNOSIS — J439 Emphysema, unspecified: Secondary | ICD-10-CM

## 2019-03-21 DIAGNOSIS — J209 Acute bronchitis, unspecified: Secondary | ICD-10-CM

## 2019-03-21 DIAGNOSIS — R911 Solitary pulmonary nodule: Secondary | ICD-10-CM | POA: Diagnosis not present

## 2019-03-21 DIAGNOSIS — R0609 Other forms of dyspnea: Secondary | ICD-10-CM

## 2019-03-21 LAB — RHEUMATOID FACTOR: Rheumatoid fact SerPl-aCnc: <10 IU/mL (ref 0.0–13.9)

## 2019-03-21 LAB — ANCA TITERS
Atypical P-ANCA titer: <1:20 {titer}
C-ANCA: <1:20 {titer}
P-ANCA: <1:20 {titer}

## 2019-03-21 LAB — ANGIOTENSIN CONVERTING ENZYME: Angiotensin-Converting Enzyme: 14 U/L (ref 14–82)

## 2019-03-21 LAB — MISC LABCORP TEST (SEND OUT)
Labcorp test code: 163840
Labcorp test code: 163857
Labcorp test code: 164034

## 2019-03-21 LAB — SJOGREN'S SYNDROME ANTIBODS(SSA + SSB)
SSA (Ro) (ENA) Antibody, IgG: <0.2 AI (ref 0.0–0.9)
SSB (La) (ENA) Antibody, IgG: <0.2 AI (ref 0.0–0.9)

## 2019-03-21 LAB — ALDOLASE: Aldolase: 3.8 U/L (ref 3.3–10.3)

## 2019-03-21 LAB — ANTI-SCLERODERMA ANTIBODY: Scleroderma (Scl-70) (ENA) Antibody, IgG: <0.2 AI (ref 0.0–0.9)

## 2019-03-21 LAB — ANTI-DNA ANTIBODY, DOUBLE-STRANDED: ds DNA Ab: <1 IU/mL (ref 0–9)

## 2019-03-21 MED ORDER — DOXYCYCLINE HYCLATE 100 MG PO TABS: 100.0000 mg | ORAL_TABLET | Freq: Two times a day (BID) | ORAL | 0 refills | Status: DC

## 2019-03-21 NOTE — Addendum Note (Signed)
Addended by: Maryanna Shape A on: 03/21/2019 02:22 PM   Modules accepted: Orders

## 2019-03-21 NOTE — Progress Notes (Signed)
OV 01/31/2019  Subjective:  Patient ID: [name], male , DOB: 1960-03-15 , age 59 y.o. , MRN: [id number] , ADDRESS: [place] [id number] PCP Steele Sizer, MD   01/31/2019 -   Chief Complaint  Patient presents with  . pulmonary consult    per Dr. Ancil Boozer. dx with PNA 10/2018. since he has had sob with exertion.      HPI JAFFAR RIEDMAN 59 y.o. -referred by primary care physician for shortness of breath on exertion relieved by rest.  He tells me that he has end-stage renal disease for the last 2 years.  He says at baseline he was doing really well with an excellent quality of life and functional status.  Then in September 2020 he had cough and shortness of breath acutely.  This was diagnosis pneumonia accordingly to him.  He was given short course antibiotics.  He says that he does not recollect if he had a fever.  And then after that a lot of the symptoms improved except the shortness of breath.  This is present on exertion relieved by rest.  It is mild to moderate in severity.  It is not progressive since it started.  However it is limiting him from doing exercises.  He says he had a cardiac work-up and therefore he was cleared.  Review of cardiac work-up indicates that his stress test on January 13, 2019 was low risk but his ejection fraction is 45-50%.  He is not aware he has chronic systolic dysfunction.  He has blood work in April 2020 showing he is anemic with a hemoglobin in the 9 g% range.  This is worse than before.  He is not aware that he is anemic.  He denies any current cough wheezing, orthopnea, proximal nocturnal dyspnea, edema.  He goes for Monday Wednesday Friday dialysis.  He says after the dialysis he feels better.  He says the shortness of breath is associated with lower chest tightness especially in the infrascapular region.  His walking desaturation test today was fine.     Simple office walk 185 feet x  3 laps goal with forehead  probe 01/31/2019   O2 used RA  Number laps completed 3  Comments about pace Moderate pace  Resting Pulse Ox/HR 98% and 67/min  Final Pulse Ox/HR 97% and 69/min  Desaturated </= 88% no  Desaturated <= 3% points no  Got Tachycardic >/= 90/min no  Symptoms at end of test Very mild dyspnea  Miscellaneous comments x    CXR Oct 2020 - personally visualized - clear (CT abd lung cut 2014 - LL atelectasis v scarrring)   ROS - per HPI     OV 02/21/2019 - telephone visit  Subjective:  Patient ID: [name], male , DOB: 1960/03/06 , age 59 y.o. , MRN: [id number] , ADDRESS: [place] [id number]   02/21/2019 -  telephon visit to discuss HRCT/cbc results in setting of ESRD x 3 years, chronicy systolic chf,  Anemia (on iron since 3 years) , crackles and symptoms of dyspnea.     HPI ADRIANE BRINCKS 59 y.o. -in this telephone visit the risks, benefits and limitations of the telephone visit were discussed.  The purpose of this visit is to discuss this dyspnea work-up in terms of the results of his pulmonary function tests, CBC and also high-resolution CT chest.  The pulmonary function test clearly shows restriction with reduction in diffusion capacity suggestive of  ILD.  The high-resolution CT chest suggests he might have ILD.  Although personal visualization it looks to me that he might have post pneumonia atelectasis but certainly ILD cannot be ruled out.  I agree with the radiologist on this.  If this were UIP it would be indeterminate which means lower than 50% probably of UIP.  His CBC shows worsening anemia.  He tells me that he has been on iron tablets for 3 years and starting dialysis.  Despite this his hemoglobin seems to be getting worse.  In addition high-resolution CT chest shows emphysema.  He says he has not smoked and he does not smoke but is around a lot of smokers for many years because he lives in a basement.  He is aware this emphysema finding could be passive  smoking related.  He is on Anoro but he says it is not helping him.   IMPRESSION: 1. There is a spectrum of findings which could be consistent with early or mild interstitial lung disease, at this time considered indeterminate for usual interstitial pneumonia (UIP) per current ATS guidelines. If there is persistent clinical concern for interstitial lung disease, repeat high-resolution chest CT would be recommended in 12 months to assess for temporal changes in the appearance of the lung parenchyma. 2. Mild diffuse bronchial wall thickening with mild centrilobular emphysema. 3. Right lower lobe pulmonary nodule measuring 9 x 5 mm (mean diameter of 7 mm), favored to represent a subpleural lymph node, but nonspecific. Attention at time of repeat high-resolution chest CT is recommended to ensure stability. 4. Mild cardiomegaly. 5. Aortic atherosclerosis, in addition to 3 vessel coronary artery disease. Please note that although the presence of coronary artery calcium documents the prsence of coronary artery disease, the severity of this disease and any potential stenosis cannot be assessed on this non-gated CT examination. Assessment for potential risk factor modification, dietary therapy or pharmacologic therapy may be warranted, if cliniclly indicated.  Aortic Atherosclerosis (ICD10-I70.0) and Emphysema (ICD10-J43.9).   Electronically Signed   By: Vinnie Langton M.D.   On: 02/18/2019 16:46   ROS - per HPI    OV 03/21/2019  Subjective:  Patient ID: [name], male , DOB: 07-Sep-1960 , age 59 y.o. , MRN: [id number] , ADDRESS: [place] [id number]  PCP Steele Sizer, MD   03/21/2019 -   Chief Complaint  Patient presents with  . Follow-up    Pt states he has been feeling about the same. Cough due to pneumonia. Occ wheezing, Pt denies any fever or chills    - ESRD x 3 years, chronicy systolic chf,  Anemia (on iron since 3 years) , HIV crackles  and symptoms of dyspnea. 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 -follow-up multifactorial dyspnea in the setting of chronic systolic heart failure, anemia, end-stage renal disease with HIV.  Concern for associated emphysema, ILD and lung nodule in January 2021 CT scan.  In this visit we were supposed to check on his dyspnea start.  In this visit we supposed to check on his dyspnea after starting Spiriva.  Also supposed to review his autoimmune labs.  He tells me that after starting Spiriva he is no better.  He is more worried about his overall fatigue which he thinks is because of blood pressure medications.  He did not do his autoimmune profile till yesterday and some of the results are back and  it is still negative.  In the interim he ended up in [facility] ER a few days ago.  It also appears that he had an admission in end of January 2021.  In the ER he had no acute symptoms with the blood pressure was 193/116 with a pulse ox of 99% as reviewed by the chart.  He had a chest x-ray that I do not have the image but it says ill-defined patchy density in the right midlung zone question developing consolidation.  He did not have any cough or fever at that time.  So the ER discharged him.  He now tells me that he actually has a cough for the last several days.  It is a recurrence of the cough.  He is frustrated by all his symptoms.  He is wondering why I did not do a chest x-ray today.  However there is a telephone visit.  He says that she was on a chest x-ray yesterday when he came for his labs.  He wanted to switch to [facility] pulmonary but they do not have an appointment for him.  Therefore he wants to continue to see me till he gets an appointment there.      Results for EYASU, JILES (MRN [id number]) as of 03/21/2019 12:06  Ref. Range 08/23/2016 06:14 08/23/2016 14:07 08/26/2016 04:06 10/09/2016 04:05 03/20/2019 09:52  Anti Nuclear Antibody (ANA)  Latest Ref Range: Negative  Negative      Angiotensin-Converting Enzyme Latest Ref Range: 14 - 82 U/L     14  RA Latex Turbid. Latest Ref Range: 0.0 - 13.9 IU/mL     <10.0  Cytoplasmic (C-ANCA) Latest Ref Range: Neg:<1:20 titer  <1:20     P-ANCA Latest Ref Range: Neg:<1:20 titer  <1:20     Atypical P-ANCA titer Latest Ref Range: Neg:<1:20 titer  <1:20         PFT FVC fev1 ratio BD fev1 TLC DLCO uncorr  02/20/2019  3.38L/71% 2.78L/70% x/97% x 4.4L/56% 20.7/65%      Results for TOXIE, SIMONINI (MRN [id number]) as of 03/21/2019 12:06  Ref. Range 12/21/2016 15:05 12/28/2016 10:48 01/02/2017 09:06 04/29/2017 07:52 06/01/2017 09:23 05/01/2018 11:20 05/24/2018 15:34 01/31/2019 12:36 03/05/2019 15:48 03/06/2019 04:13 03/20/2019 09:52  Hemoglobin Latest Ref Range: 13.0 - 17.0 g/dL 12.9 (L) 13.9 13.2 10.1 (L) 9.8 (L) 10.8 (L) 9.4 (L) 8.8 (L) 9.6 (L) 9.7 (L)    ROS - per HPI     has a past medical history of Chronic kidney disease, Dermatophytosis of foot (06/23/2004), Environmental allergies, Gout, HIV infection ([place]) (12/1995), Hypertension, and Seborrhea (06/23/2004).   reports that he has never smoked. He has never used smokeless tobacco.  Past Surgical History:  Procedure Laterality Date  . CAPD INSERTION N/A 12/28/2016   Procedure: LAPAROSCOPIC INSERTION CONTINUOUS AMBULATORY PERITONEAL DIALYSIS  (CAPD) CATHETER;  Surgeon: Algernon Huxley, MD;  Location: [facility] ORS;  Service: Vascular;  Laterality: N/A;  . DIALYSIS/PERMA CATHETER INSERTION N/A 10/09/2016   Procedure: DIALYSIS/PERMA CATHETER INSERTION;  Surgeon: Algernon Huxley, MD;  Location: [place];  Service: Cardiovascular;  Laterality: N/A;  . DIALYSIS/PERMA CATHETER REMOVAL N/A 02/19/2017   Procedure: DIALYSIS/PERMA CATHETER REMOVAL;  Surgeon: Algernon Huxley, MD;  Location: [place] [facility];  Service: Cardiovascular;  Laterality: N/A;  . HERNIA REPAIR Bilateral    inguinal    Allergies  Allergen Reactions  . Losartan Swelling     Lip swelling  . Quinapril Hcl Swelling    Face swells  Immunization History  Administered Date(s) Administered  . Hepatitis A 07/11/2012  . Hepatitis A, Adult 03/13/2013  . Hepatitis B 06/22/2004, 07/11/2012  . Hepatitis B, adult 11/07/2012, 03/13/2013, 01/17/2017, 02/18/2018  . Influenza Split 11/03/2011  . Influenza Whole 01/02/1996  . Influenza,inj,Quad PF,6+ Mos 10/05/2017  . Influenza-Unspecified 10/31/2012, 02/15/2016, 11/28/2016, 03/04/2018, 11/13/2018  . PPD Test 10/08/2016, 02/11/2018, 02/18/2018  . Pneumococcal Conjugate-13 05/10/2018  . Pneumococcal Polysaccharide-23 06/22/2004, 02/28/2012  . Tdap 10/05/2017    Family History  Problem Relation Age of Onset  . Bradycardia Maternal Grandmother        with pacemaker     Current Outpatient Medications:  .  amLODipine (NORVASC) 10 MG tablet, Take 1 tablet (10 mg total) by mouth daily., Disp: 30 tablet, Rfl: 0 .  AURYXIA 1 GM 210 MG(Fe) tablet, Take 1 tablet by mouth 4 (four) times daily -  before meals and at bedtime., Disp: , Rfl:  .  B Complex-C-Folic Acid (RENA-VITE RX) 1 MG TABS, Take 1 tablet by mouth daily., Disp: , Rfl:  .  carvedilol (COREG) 25 MG tablet, Take 1 tablet (25 mg total) by mouth 2 (two) times daily., Disp: 180 tablet, Rfl: 3 .  cetirizine (ZYRTEC) 10 MG tablet, Take 1 tablet (10 mg total) by mouth daily., Disp: 30 tablet, Rfl: 11 .  cloNIDine (CATAPRES - DOSED IN MG/24 HR) 0.2 mg/24hr patch, Place 1 patch (0.2 mg total) onto the skin once a week., Disp: 4 patch, Rfl: 12 .  DESCOVY 200-25 MG tablet, Take 1 tablet by mouth daily., Disp: , Rfl:  .  diclofenac sodium (VOLTAREN) 1 % GEL, Apply 2 g topically 4 (four) times daily., Disp: 100 g, Rfl: 0 .  ferrous sulfate 325 (65 FE) MG tablet, Take 1 tablet by mouth daily., Disp: , Rfl:  .  furosemide (LASIX) 40 MG tablet, Take 80 mg by mouth daily. , Disp: , Rfl:  .  halobetasol (ULTRAVATE) 0.05 % cream, APPLY TO AFFECTED AREA TWICE A DAY, Disp: , Rfl:  .   hydrALAZINE (APRESOLINE) 100 MG tablet, Take 100 mg by mouth 3 (three) times daily., Disp: , Rfl:  .  methocarbamol (ROBAXIN) 500 MG tablet, Take 500 mg by mouth 2 (two) times daily as needed. for pain, Disp: , Rfl:  .  predniSONE (DELTASONE) 10 MG tablet, Take 1 tablet (10 mg total) by mouth 2 (two) times daily as needed. Prn gout attack for 2 days, Disp: 10 tablet, Rfl: 0 .  SPIRIVA RESPIMAT 1.25 MCG/ACT AERS, INHALE 2 PUFFS BY MOUTH INTO THE LUNGS DAILY, Disp: 4 g, Rfl: 3 .  TIVICAY 50 MG tablet, Take 1 tablet by mouth daily., Disp: , Rfl:  .  valACYclovir (VALTREX) 500 MG tablet, Take 500 mg by mouth daily as needed (for flare ups). , Disp: , Rfl:  .  Febuxostat 80 MG TABS, Take 1 tablet by mouth daily., Disp: , Rfl:       Objective:   There were no vitals filed for this visit.  Estimated body mass index is 23.65 kg/m as calculated from the following:   Height as of 03/05/19: 6\' 2"  (1.88 m).   Weight as of 03/06/19: 184 lb 3.2 oz (83.6 kg).  @WEIGHTCHANGE @  There were no vitals filed for this visit.   Physical Exam   Telephone visit - sounded normal      Assessment:       ICD-10-CM   1. Acute bronchitis with COPD ([place])  J44.0    J20.9  2. ILD (interstitial lung disease) ([place])  J84.9   3. Pulmonary emphysema, unspecified emphysema type ([place])  J43.9   4. Dyspnea on exertion  R06.00   5. Nodule of lower lobe of right lung  R91.1        Plan:     Patient Instructions     ICD-10-CM   1. Acute bronchitis with COPD ([place])  J44.0    J20.9   2. ILD (interstitial lung disease) ([place])  J84.9   3. Pulmonary emphysema, unspecified emphysema type ([place])  J43.9   4. Dyspnea on exertion  R06.00   5. Nodule of lower lobe of right lung  R91.1     Acute bronchitis with COPD ([place])  -Unclear if he has actual pneumonia of his volume overload but at this point in time given cough and symptom burden best to treat with an antibiotic  plan - Take doxycycline 100mg  po twice daily x  5 days; take after meals and avoid sunlight   ILD (interstitial lung disease) ([place])  -Idiopathic interstitial ILD. -At this point best to monitor -Biopsy can be very risky given all his medical problems -Antifibrotic would add to side effect and symptom burden -Probably best time to add antifibrotic would be if he declined    Pulmonary emphysema, unspecified emphysema type ([facility])  No improvement in dyspnea despite starting Spiriva  Plan  -For now continue Spiriva  Dyspnea is multifactorial  Plan  -Refer to pulmonary rehabilitation when facility is available  Right-sided pulmonary nodule 9 mm January 2021  Plan  -0 do high-resolution CT chest in 3 months  Follow-up -3 months [facility] but after doing high-resolution CT chest   (Level 04: Estb 30-39 min   visit type: telephone visit visit spent in total care time and counseling or/and coordination of care by this undersigned MD - Dr Brand Males. This includes one or more of the following on this same day 03/21/2019: pre-charting, chart review, note writing, documentation discussion of test results, diagnostic or treatment recommendations, prognosis, risks and benefits of management options, instructions, education, compliance or risk-factor reduction. It excludes time spent by the [place] or office staff in the care of the patient . Actual time is 35 min)   SIGNATURE    Dr. Brand Males, M.D., F.C.C.P,  Pulmonary and Critical Care Medicine Staff Physician, [place] Director - Interstitial Lung Disease  Program  Pulmonary [place] [facility] [place], [place], [address]  Pager: [phone], If no answer or between  15:00h - 7:00h: call [phone] Telephone: [phone]  12:35 PM 03/21/2019

## 2019-03-21 NOTE — Telephone Encounter (Signed)
Orders placed.

## 2019-03-21 NOTE — Patient Instructions (Addendum)
ICD-10-CM   1. Acute bronchitis with COPD ([place])  J44.0    J20.9   2. ILD (interstitial lung disease) ([place])  J84.9   3. Pulmonary emphysema, unspecified emphysema type ([place])  J43.9   4. Dyspnea on exertion  R06.00   5. Nodule of lower lobe of right lung  R91.1     Acute bronchitis with COPD ([place])  -Unclear if he has actual pneumonia of his volume overload but at this point in time given cough and symptom burden best to treat with an antibiotic  plan - Take doxycycline 100mg  po twice daily x 5 days; take after meals and avoid sunlight   ILD (interstitial lung disease) ([place])  -Idiopathic interstitial ILD. -At this point best to monitor -Biopsy can be very risky given all his medical problems -Antifibrotic would add to side effect and symptom burden -Probably best time to add antifibrotic would be if he declined    Pulmonary emphysema, unspecified emphysema type ([facility])  No improvement in dyspnea despite starting Spiriva  Plan  -For now continue Spiriva  Dyspnea is multifactorial  Plan  -Refer to pulmonary rehabilitation when facility is available  Right-sided pulmonary nodule 9 mm January 2021  Plan  -0 do high-resolution CT chest in 3 months  Follow-up -3 months [facility] but after doing high-resolution CT chest

## 2019-03-24 LAB — HYPERSENSITIVITY PNEUMONITIS
A. Pullulans Abs: NEGATIVE
A.Fumigatus #1 Abs: NEGATIVE
Micropolyspora faeni, IgG: NEGATIVE
Pigeon Serum Abs: NEGATIVE
Thermoact. Saccharii: NEGATIVE
Thermoactinomyces vulgaris, IgG: NEGATIVE

## 2019-03-26 ENCOUNTER — Telehealth: Payer: Self-pay | Admitting: Internal Medicine

## 2019-03-26 DIAGNOSIS — N186 End stage renal disease: Principal | ICD-10-CM

## 2019-03-26 DIAGNOSIS — B20 Human immunodeficiency virus [HIV] disease: Principal | ICD-10-CM

## 2019-03-26 NOTE — Telephone Encounter (Signed)
Called and spoke to pt, who is requesting lab results from 03/20/2019. Pt also wanted to make Dr. Chase Caller aware that he completed doxycycline with improvement in breathing, however he is still experiencing an occasional non prod cough and sob with exertion. Sob is at baseline since having PNA per pt.    MR, please advise. Thanks

## 2019-03-26 NOTE — Telephone Encounter (Signed)
Pt calling to get the results of his labs that were drawn on 2.4. Pt also wanted to let Dr. Chase Caller know that he finished the medicine that he was on. Pt can be reached at [phone]

## 2019-03-27 NOTE — Telephone Encounter (Signed)
All ILD labs 03/20/2019 are normal - means that ILD is not from autoimmune reason  Glad cough better  Regardin residual dyspnea - probably best he make an appt for face to face with me or app

## 2019-03-28 NOTE — Telephone Encounter (Signed)
Pt is aware of results and voiced his understanding.  Pt has been scheduled for OV on 04/04/2019 at 12:00. Nothing further is needed.

## 2019-04-04 ENCOUNTER — Ambulatory Visit: Payer: PRIVATE HEALTH INSURANCE | Admitting: Internal Medicine

## 2019-04-07 ENCOUNTER — Telehealth: Payer: Self-pay | Admitting: Cardiology

## 2019-04-07 NOTE — Telephone Encounter (Signed)
Patient calling in asking advise on a "laser watch" that is supposed to keep your BP down by wearing it. Patient has seen it advertised on the Internet and is wondering if he should get one due to his high BP concerns.   Patient wondering if there may be an option of a watch that may be covered under insurance like this  Please advise

## 2019-04-07 NOTE — Telephone Encounter (Signed)
Spoke the patient. Advised him that I have discussed with Dr. Garen Lah and neither him or I are familiar with the device he is referring to. Advised the patient that we do not normally write prescriptions for medical equipment. Recommended that he contact his insurance company to inquire whether or not BP monitoring devices would covered under his plan, if so he can contact his pcp to discuss.  Patient sts that he has been compliant with his dialysis. He has been staying below his fluid numbers, but his BP still remains elevated. He declined rescheduling his appt with Cardiology at this time.

## 2019-04-08 ENCOUNTER — Telehealth: Payer: Self-pay | Admitting: Internal Medicine

## 2019-04-08 NOTE — Telephone Encounter (Signed)
Received short term disability forms from [place] [employer] [place]. This request has been faxed to [employer].

## 2019-04-14 DIAGNOSIS — N2581 Secondary hyperparathyroidism of renal origin: Secondary | ICD-10-CM | POA: Diagnosis not present

## 2019-04-14 DIAGNOSIS — D631 Anemia in chronic kidney disease: Secondary | ICD-10-CM | POA: Diagnosis not present

## 2019-04-14 DIAGNOSIS — Z992 Dependence on renal dialysis: Secondary | ICD-10-CM | POA: Diagnosis not present

## 2019-04-14 DIAGNOSIS — N186 End stage renal disease: Secondary | ICD-10-CM | POA: Diagnosis not present

## 2019-04-16 DIAGNOSIS — D631 Anemia in chronic kidney disease: Secondary | ICD-10-CM | POA: Diagnosis not present

## 2019-04-16 DIAGNOSIS — N2581 Secondary hyperparathyroidism of renal origin: Secondary | ICD-10-CM | POA: Diagnosis not present

## 2019-04-16 DIAGNOSIS — Z992 Dependence on renal dialysis: Secondary | ICD-10-CM | POA: Diagnosis not present

## 2019-04-16 DIAGNOSIS — N186 End stage renal disease: Secondary | ICD-10-CM | POA: Diagnosis not present

## 2019-04-18 ENCOUNTER — Encounter: Payer: Self-pay | Admitting: Internal Medicine

## 2019-04-18 ENCOUNTER — Ambulatory Visit (INDEPENDENT_AMBULATORY_CARE_PROVIDER_SITE_OTHER): Payer: PRIVATE HEALTH INSURANCE | Admitting: Internal Medicine

## 2019-04-18 ENCOUNTER — Telehealth: Payer: Self-pay

## 2019-04-18 ENCOUNTER — Telehealth: Payer: Self-pay | Admitting: Internal Medicine

## 2019-04-18 ENCOUNTER — Other Ambulatory Visit: Payer: Self-pay

## 2019-04-18 VITALS — BP 154/96 | HR 72 | Temp 98.1°F | Ht 74.0 in | Wt 185.8 lb

## 2019-04-18 DIAGNOSIS — N186 End stage renal disease: Secondary | ICD-10-CM | POA: Diagnosis not present

## 2019-04-18 DIAGNOSIS — J849 Interstitial pulmonary disease, unspecified: Secondary | ICD-10-CM | POA: Diagnosis not present

## 2019-04-18 DIAGNOSIS — D631 Anemia in chronic kidney disease: Secondary | ICD-10-CM | POA: Diagnosis not present

## 2019-04-18 DIAGNOSIS — Z992 Dependence on renal dialysis: Secondary | ICD-10-CM | POA: Diagnosis not present

## 2019-04-18 DIAGNOSIS — N2581 Secondary hyperparathyroidism of renal origin: Secondary | ICD-10-CM | POA: Diagnosis not present

## 2019-04-18 MED ORDER — BUDESONIDE-FORMOTEROL FUMARATE 80-4.5 MCG/ACT IN AERO: 2.0000 | INHALATION_SPRAY | Freq: Two times a day (BID) | RESPIRATORY_TRACT | 12 refills | Status: DC

## 2019-04-18 NOTE — Progress Notes (Signed)
OV 01/31/2019  Subjective:  Patient ID: Louis [name], male , DOB: 1960-10-29 , age 59 y.o. , MRN: [id number] , ADDRESS: [place] [address] PCP Steele Sizer, MD   01/31/2019 -   Chief Complaint  Patient presents with  . pulmonary consult    per Dr. Ancil Boozer. dx with PNA 10/2018. since he has had sob with exertion.      HPI Louis [name] 59 y.o. -referred by primary care physician for shortness of breath on exertion relieved by rest.  He tells me that he has end-stage renal disease for the last 2 years.  He says at baseline he was doing really well with an excellent quality of life and functional status.  Then in September 2020 he had cough and shortness of breath acutely.  This was diagnosis pneumonia accordingly to him.  He was given short course antibiotics.  He says that he does not recollect if he had a fever.  And then after that a lot of the symptoms improved except the shortness of breath.  This is present on exertion relieved by rest.  It is mild to moderate in severity.  It is not progressive since it started.  However it is limiting him from doing exercises.  He says he had a cardiac work-up and therefore he was cleared.  Review of cardiac work-up indicates that his stress test on January 13, 2019 was low risk but his ejection fraction is 45-50%.  He is not aware he has chronic systolic dysfunction.  He has blood work in April 2020 showing he is anemic with a hemoglobin in the 9 g% range.  This is worse than before.  He is not aware that he is anemic.  He denies any current cough wheezing, orthopnea, proximal nocturnal dyspnea, edema.  He goes for Monday Wednesday Friday dialysis.  He says after the dialysis he feels better.  He says the shortness of breath is associated with lower chest tightness especially in the infrascapular region.  His walking desaturation test today was fine.    CXR Oct 2020 - personally visualized - clear (CT abd lung cut  2014 - LL atelectasis v scarrring)   ROS - per HPI     OV 02/21/2019 - telephone visit  Subjective:  Patient ID: Louis [name], male , DOB: Mar 05, 1960 , age 59 y.o. , MRN: [id number] , ADDRESS: [place] [address]   02/21/2019 -  telephon visit to discuss HRCT/cbc results in setting of ESRD x 3 years, chronicy systolic chf,  Anemia (on iron since 3 years) , crackles and symptoms of dyspnea.     HPI Louis [name] 59 y.o. -in this telephone visit the risks, benefits and limitations of the telephone visit were discussed.  The purpose of this visit is to discuss this dyspnea work-up in terms of the results of his pulmonary function tests, CBC and also high-resolution CT chest.  The pulmonary function test clearly shows restriction with reduction in diffusion capacity suggestive of ILD.  The high-resolution CT chest suggests he might have ILD.  Although personal visualization it looks to me that he might have post pneumonia atelectasis but certainly ILD cannot be ruled out.  I agree with the radiologist on this.  If this were UIP it would be indeterminate which means lower than 50% probably of UIP.  His CBC shows worsening anemia.  He tells me that he has been on iron tablets for 3 years and  starting dialysis.  Despite this his hemoglobin seems to be getting worse.  In addition high-resolution CT chest shows emphysema.  He says he has not smoked and he does not smoke but is around a lot of smokers for many years because he lives in a basement.  He is aware this emphysema finding could be passive smoking related.  He is on Anoro but he says it is not helping him.   IMPRESSION: 1. There is a spectrum of findings which could be consistent with early or mild interstitial lung disease, at this time considered indeterminate for usual interstitial pneumonia (UIP) per current ATS guidelines. If there is persistent clinical concern for interstitial lung disease, repeat  high-resolution chest CT would be recommended in 12 months to assess for temporal changes in the appearance of the lung parenchyma. 2. Mild diffuse bronchial wall thickening with mild centrilobular emphysema. 3. Right lower lobe pulmonary nodule measuring 9 x 5 mm (mean diameter of 7 mm), favored to represent a subpleural lymph node, but nonspecific. Attention at time of repeat high-resolution chest CT is recommended to ensure stability. 4. Mild cardiomegaly. 5. Aortic atherosclerosis, in addition to 3 vessel coronary artery disease. Please note that although the presence of coronary artery calcium documents the prsence of coronary artery disease, the severity of this disease and any potential stenosis cannot be assessed on this non-gated CT examination. Assessment for potential risk factor modification, dietary therapy or pharmacologic therapy may be warranted, if cliniclly indicated.  Aortic Atherosclerosis (ICD10-I70.0) and Emphysema (ICD10-J43.9).   Electronically Signed   By: Vinnie Langton M.D.   On: 02/18/2019 16:46   ROS - per HPI    OV 03/21/2019  Subjective:  Patient ID: Louis [name], male , DOB: Aug 25, 1960 , age 59 y.o. , MRN: [id number] , ADDRESS: [place] [address]  PCP Steele Sizer, MD   03/21/2019 -   Chief Complaint  Patient presents with  . Follow-up    Pt states he has been feeling about the same. Cough due to pneumonia. Occ wheezing, Pt denies any fever or chills    - ESRD x 3 years, chronicy systolic chf,  Anemia (on iron since 3 years) , HIV crackles and symptoms of dyspnea. 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 -follow-up multifactorial dyspnea in the setting of chronic systolic heart failure, anemia, end-stage renal disease with HIV.  Concern for associated emphysema, ILD and lung nodule in January 2021 CT scan.  In this visit we were supposed to check  on his dyspnea start.  In this visit we supposed to check on his dyspnea after starting Spiriva.  Also supposed to review his autoimmune labs.  He tells me that after starting Spiriva he is no better.  He is more worried about his overall fatigue which he thinks is because of blood pressure medications.  He did not do his autoimmune profile till yesterday and some of the results are back and it is still negative.  In the interim he ended up in [facility] ER a few days ago.  It also appears that he had an admission in end of January 2021.  In the ER he had no acute symptoms with the blood pressure was 193/116 with a pulse ox of 99% as reviewed by the chart.  He had a chest x-ray that I do not have the image but it says ill-defined patchy density in  the right midlung zone question developing consolidation.  He did not have any cough or fever at that time.  So the ER discharged him.  He now tells me that he actually has a cough for the last several days.  It is a recurrence of the cough.  He is frustrated by all his symptoms.  He is wondering why I did not do a chest x-ray today.  However there is a telephone visit.  He says that she was on a chest x-ray yesterday when he came for his labs.  He wanted to switch to [facility] pulmonary but they do not have an appointment for him.  Therefore he wants to continue to see me till he gets an appointment there.      Results for TARRIS, DELBENE (MRN [id number]) as of 03/21/2019 12:06  Ref. Range 08/23/2016 06:14 08/23/2016 14:07 08/26/2016 04:06 10/09/2016 04:05 03/20/2019 09:52  Anti Nuclear Antibody (ANA) Latest Ref Range: Negative  Negative      Angiotensin-Converting Enzyme Latest Ref Range: 14 - 82 U/L     14  RA Latex Turbid. Latest Ref Range: 0.0 - 13.9 IU/mL     <10.0  Cytoplasmic (C-ANCA) Latest Ref Range: Neg:<1:20 titer  <1:20     P-ANCA Latest Ref Range: Neg:<1:20 titer  <1:20     Atypical P-ANCA titer Latest Ref Range: Neg:<1:20 titer  <1:20       OV  04/18/2019  Subjective:  Patient ID: Louis [name], male , DOB: 10-31-1960 , age 70 y.o. , MRN: [id number] , ADDRESS: [place] [address]   - ESRD x 3 years, chronicy systolic chf,  Anemia (on iron since 3 years) , HIV crackles and symptoms of dyspnea. Indeterminate ILD with emphysema on CT Jan 2021 along with 9 mm subpleural nodule   04/18/2019 -   Chief Complaint  Patient presents with  . Follow-up    Pt states he thinks he has some congestion. SOB on exertion. Occ wheezing in the am. Pt denies any cough, fever, chills, or sweats     HPI Louis [name] 59 y.o. - > presents for follow-up.  Last seen 1 month ago.  I am supposed to see him only in April or May 2021 but he is here earlier.  He does not know why.  At first he said he is stable but then later he told me that he is actually feeling worse.  He feels his dyspnea is worse particularly early in the morning he at that time feels his chest is tight at the lower end.  He is also reporting worsening dyspnea.  Walking desaturation test today shows a decline.  In fact he is having hypoxemia.  This is a change from the December walking desaturation test.  We will plan for a CT scan in April 2021 but he wants this done sooner.  There is no wheezing.  He does have some mucus.  He feels a Spiriva is not helping him.  He plans to start pulmonary rehabilitation soon.  SYMPTOM SCALE - ILD 04/18/2019   O2 use ra  Shortness of Breath 0 -> 5 scale with 5 being worst (score 6 If unable to do)  At rest 0  Simple tasks - showers, clothes change, eating, shaving 0  Household (dishes, doing bed, laundry) 3  Shopping 3  Walking level at own pace 3  Walking up Stairs 3  Total (30-36) Dyspnea Score 12  How bad is your cough? 1  How bad is your fatigue 5  How bad is nausea 0  How bad is vomiting?  0  How bad is diarrhea? 0  How bad is anxiety? 0  How bad is depression 0      Simple office walk 185 feet x  3 laps goal with  forehead probe 01/31/2019  04/18/2019   O2 used RA   Number laps completed 3 3  Comments about pace Moderate pace   Resting Pulse Ox/HR 98% and 67/min 99% and 73  Final Pulse Ox/HR 97% and 69/min 86% and 86/min  Desaturated </= 88% no yes  Desaturated <= 3% points no yes  Got Tachycardic >/= 90/min no no  Symptoms at end of test Very mild dyspnea   Miscellaneous comments x ? worse      PFT FVC fev1 ratio BD fev1 TLC DLCO uncorr  02/20/2019  3.38L/71% 2.78L/70% x/97% x 4.4L/56% 20.7/65%       Results for STEFFEN, HASE (MRN [id number]) as of 03/21/2019 12:06  Ref. Range 12/21/2016 15:05 12/28/2016 10:48 01/02/2017 09:06 04/29/2017 07:52 06/01/2017 09:23 05/01/2018 11:20 05/24/2018 15:34 01/31/2019 12:36 03/05/2019 15:48 03/06/2019 04:13 03/20/2019 09:52  Hemoglobin Latest Ref Range: 13.0 - 17.0 g/dL 12.9 (L) 13.9 13.2 10.1 (L) 9.8 (L) 10.8 (L) 9.4 (L) 8.8 (L) 9.6 (L) 9.7 (L)      ROS - per HPI     has a past medical history of Chronic kidney disease, Dermatophytosis of foot (06/23/2004), Environmental allergies, Gout, HIV infection ([place]) (12/1995), Hypertension, and Seborrhea (06/23/2004).   reports that he has never smoked. He has never used smokeless tobacco.  Past Surgical History:  Procedure Laterality Date  . CAPD INSERTION N/A 12/28/2016   Procedure: LAPAROSCOPIC INSERTION CONTINUOUS AMBULATORY PERITONEAL DIALYSIS  (CAPD) CATHETER;  Surgeon: Algernon Huxley, MD;  Location: [facility] ORS;  Service: Vascular;  Laterality: N/A;  . DIALYSIS/PERMA CATHETER INSERTION N/A 10/09/2016   Procedure: DIALYSIS/PERMA CATHETER INSERTION;  Surgeon: Algernon Huxley, MD;  Location: [place] [facility];  Service: Cardiovascular;  Laterality: N/A;  . DIALYSIS/PERMA CATHETER REMOVAL N/A 02/19/2017   Procedure: DIALYSIS/PERMA CATHETER REMOVAL;  Surgeon: Algernon Huxley, MD;  Location: [place] [facility];  Service: Cardiovascular;  Laterality: N/A;  . HERNIA REPAIR Bilateral    inguinal    Allergies  Allergen  Reactions  . Losartan Swelling    Lip swelling  . Quinapril Hcl Swelling    Face swells    Immunization History  Administered Date(s) Administered  . Hepatitis A 07/11/2012  . Hepatitis A, Adult 03/13/2013  . Hepatitis B 06/22/2004, 07/11/2012  . Hepatitis B, adult 11/07/2012, 03/13/2013, 01/17/2017, 02/18/2018  . Influenza Split 11/03/2011  . Influenza Whole 01/02/1996  . Influenza,inj,Quad PF,6+ Mos 10/05/2017  . Influenza-Unspecified 10/31/2012, 02/15/2016, 11/28/2016, 03/04/2018, 11/13/2018  . PPD Test 10/08/2016, 02/11/2018, 02/18/2018  . Pneumococcal Conjugate-13 05/10/2018  . Pneumococcal Polysaccharide-23 06/22/2004, 02/28/2012  . Tdap 10/05/2017    Family History  Problem Relation Age of Onset  . Bradycardia Maternal Grandmother        with pacemaker     Current Outpatient Medications:  .  amLODipine (NORVASC) 10 MG tablet, Take 1 tablet (10 mg total) by mouth daily., Disp: 30 tablet, Rfl: 0 .  AURYXIA 1 GM 210 MG(Fe) tablet, Take 1 tablet by mouth 4 (four) times daily -  before meals and at bedtime., Disp: , Rfl:  .  B Complex-C-Folic Acid (RENA-VITE RX) 1 MG TABS, Take 1 tablet by mouth daily., Disp: , Rfl:  .  cetirizine (ZYRTEC) 10 MG tablet, Take 1 tablet (10 mg total) by mouth daily., Disp: 30 tablet, Rfl: 11 .  cloNIDine (CATAPRES - DOSED IN MG/24 HR) 0.2 mg/24hr patch, Place 1 patch (0.2 mg total) onto the skin once a week., Disp: 4 patch, Rfl: 12 .  DESCOVY 200-25 MG tablet, Take 1 tablet by mouth daily., Disp: , Rfl:  .  diclofenac sodium (VOLTAREN) 1 % GEL, Apply 2 g topically 4 (four) times daily., Disp: 100 g, Rfl: 0 .  doxycycline (VIBRA-TABS) 100 MG tablet, Take 1 tablet (100 mg total) by mouth 2 (two) times daily., Disp: 10 tablet, Rfl: 0 .  Febuxostat 80 MG TABS, Take 1 tablet by mouth daily., Disp: , Rfl:  .  ferrous sulfate 325 (65 FE) MG tablet, Take 1 tablet by mouth daily., Disp: , Rfl:  .  furosemide (LASIX) 40 MG tablet, Take 80 mg by mouth  daily. , Disp: , Rfl:  .  halobetasol (ULTRAVATE) 0.05 % cream, APPLY TO AFFECTED AREA TWICE A DAY, Disp: , Rfl:  .  hydrALAZINE (APRESOLINE) 100 MG tablet, Take 100 mg by mouth 3 (three) times daily., Disp: , Rfl:  .  methocarbamol (ROBAXIN) 500 MG tablet, Take 500 mg by mouth 2 (two) times daily as needed. for pain, Disp: , Rfl:  .  predniSONE (DELTASONE) 10 MG tablet, Take 1 tablet (10 mg total) by mouth 2 (two) times daily as needed. Prn gout attack for 2 days, Disp: 10 tablet, Rfl: 0 .  SPIRIVA RESPIMAT 1.25 MCG/ACT AERS, INHALE 2 PUFFS BY MOUTH INTO THE LUNGS DAILY, Disp: 4 g, Rfl: 3 .  TIVICAY 50 MG tablet, Take 1 tablet by mouth daily., Disp: , Rfl:  .  valACYclovir (VALTREX) 500 MG tablet, Take 500 mg by mouth daily as needed (for flare ups). , Disp: , Rfl:  .  budesonide-formoterol (SYMBICORT) 80-4.5 MCG/ACT inhaler, Inhale 2 puffs into the lungs 2 (two) times daily., Disp: 1 Inhaler, Rfl: 12 .  carvedilol (COREG) 25 MG tablet, Take 1 tablet (25 mg total) by mouth 2 (two) times daily., Disp: 180 tablet, Rfl: 3      Objective:   Vitals:   04/18/19 1237  BP: (!) 154/96  Pulse: 72  Temp: 98.1 F (36.7 C)  TempSrc: Temporal  SpO2: 99%  Weight: 185 lb 12.8 oz (84.3 kg)  Height: 6\' 2"  (1.88 m)    Estimated body mass index is 23.86 kg/m as calculated from the following:   Height as of this encounter: 6\' 2"  (1.88 m).   Weight as of this encounter: 185 lb 12.8 oz (84.3 kg).  @WEIGHTCHANGE @  [employer]   04/18/19 1237  Weight: 185 lb 12.8 oz (84.3 kg)     Physical Exam   Tall thin male.  Well-built.  Has bilateral lower lobe crackles.  Abdomen soft normal heart sounds.  Alert and oriented x3.       Assessment:       ICD-10-CM   1. Interstitial pulmonary disease (HCC)  J84.9 Pulse oximetry, overnight    CT Chest High Resolution    Pulmonary Function Test [facility] Only    CANCELED: Pulmonary Function Test [facility] Only       Plan:     Patient Instructions      ICD-10-CM   1. Acute bronchitis with COPD ([place])  J44.0    J20.9   2. ILD (interstitial lung disease) ([place])  J84.9   3. Pulmonary emphysema, unspecified emphysema type ([place])  J43.9  4. Dyspnea on exertion  R06.00   5. Nodule of lower lobe of right lung  R91.1      ILD (interstitial lung disease) (HCC)  -Idiopathic interstitial ILD. - might be getting worse bcause you are having desaturations with walking  Plan  - start portable o2 on room air - test ONO on room air - rpeat HRCT supine and prone, inspiration and expiration next 1-3 weeks - do spirometry and dlco (urgent) - next few weeks - based on above - reconsider biopsy decision - might be ok to to bronchoscopy with BAL and envisia biopsy   Pulmonary emphysema, unspecified emphysema type (HCC)  No improvement in dyspnea despite starting Spiriva  Plan  -For now continue Spiriva - add symbicort 2 puff twice daily  Dyspnea is multifactorial  Plan  -Glad you are going pulmonary rehabilitation   Right-sided pulmonary nodule 9 mm January 2021  Plan  -will capture in high-resolution CT chest  Follow-up  - < 4 weeks with Dr Chase Caller in  [place] clinic but after doing high-resolution CT and PFT     SIGNATURE    Dr. Brand Males, M.D., F.C.C.P,  Pulmonary and Critical Care Medicine Staff Physician, [place] Director - Interstitial Lung Disease  Program  Pulmonary [place] at [place], [place], [address]  Pager: [phone], If no answer or between  15:00h - 7:00h: call [phone] Telephone: [phone]  1:16 PM 04/18/2019

## 2019-04-18 NOTE — Telephone Encounter (Signed)
Pt is aware of date/time of covid test for PFT. 04/23/2019 [facility]  Pt is aware and voiced his understanding.  Nothing further is needed.

## 2019-04-18 NOTE — Addendum Note (Signed)
Addended by: Claudette Head A on: 04/18/2019 01:30 PM   Modules accepted: Orders

## 2019-04-18 NOTE — Telephone Encounter (Signed)
Created in error

## 2019-04-18 NOTE — Addendum Note (Signed)
Addended by: Claudette Head A on: 04/18/2019 04:08 PM   Modules accepted: Orders

## 2019-04-18 NOTE — Telephone Encounter (Signed)
Please see the message from the DME. Thanks.

## 2019-04-18 NOTE — Patient Instructions (Addendum)
ICD-10-CM   1. Acute bronchitis with COPD ([place])  J44.0    J20.9   2. ILD (interstitial lung disease) ([place])  J84.9   3. Pulmonary emphysema, unspecified emphysema type ([place])  J43.9   4. Dyspnea on exertion  R06.00   5. Nodule of lower lobe of right lung  R91.1      ILD (interstitial lung disease) (HCC)  -Idiopathic interstitial ILD. - might be getting worse bcause you are having desaturations with walking  Plan  - start portable o2 on room air - test ONO on room air - rpeat HRCT supine and prone, inspiration and expiration next 1-3 weeks - do spirometry and dlco (urgent) - next few weeks - based on above - reconsider biopsy decision - might be ok to to bronchoscopy with BAL and envisia biopsy   Pulmonary emphysema, unspecified emphysema type (HCC)  No improvement in dyspnea despite starting Spiriva  Plan  -For now continue Spiriva - add symbicort 2 puff twice daily  Dyspnea is multifactorial  Plan  -Glad you are going pulmonary rehabilitation   Right-sided pulmonary nodule 9 mm January 2021  Plan  -will capture in high-resolution CT chest  Follow-up  - < 4 weeks with Dr Chase Caller in  [place] clinic but after doing high-resolution CT and PFT

## 2019-04-21 ENCOUNTER — Encounter: Payer: Self-pay | Admitting: Family Medicine

## 2019-04-21 ENCOUNTER — Ambulatory Visit (INDEPENDENT_AMBULATORY_CARE_PROVIDER_SITE_OTHER): Payer: PRIVATE HEALTH INSURANCE | Admitting: Family Medicine

## 2019-04-21 ENCOUNTER — Other Ambulatory Visit: Payer: Self-pay

## 2019-04-21 VITALS — BP 172/100 | Temp 98.3°F | Wt 186.3 lb

## 2019-04-21 DIAGNOSIS — I10 Essential (primary) hypertension: Secondary | ICD-10-CM | POA: Diagnosis not present

## 2019-04-21 DIAGNOSIS — N186 End stage renal disease: Secondary | ICD-10-CM

## 2019-04-21 DIAGNOSIS — Z1211 Encounter for screening for malignant neoplasm of colon: Secondary | ICD-10-CM

## 2019-04-21 DIAGNOSIS — I700 Atherosclerosis of aorta: Secondary | ICD-10-CM

## 2019-04-21 DIAGNOSIS — R911 Solitary pulmonary nodule: Secondary | ICD-10-CM

## 2019-04-21 DIAGNOSIS — D509 Iron deficiency anemia, unspecified: Secondary | ICD-10-CM | POA: Insufficient documentation

## 2019-04-21 DIAGNOSIS — Z992 Dependence on renal dialysis: Secondary | ICD-10-CM | POA: Diagnosis not present

## 2019-04-21 DIAGNOSIS — J432 Centrilobular emphysema: Secondary | ICD-10-CM | POA: Diagnosis not present

## 2019-04-21 DIAGNOSIS — M109 Gout, unspecified: Secondary | ICD-10-CM | POA: Diagnosis not present

## 2019-04-21 DIAGNOSIS — J84112 Idiopathic pulmonary fibrosis: Secondary | ICD-10-CM

## 2019-04-21 NOTE — Addendum Note (Signed)
Addended by: Claudette Head A on: 04/21/2019 08:49 AM   Modules accepted: Orders

## 2019-04-21 NOTE — Telephone Encounter (Signed)
Called and spoke to [place] with [employer], who is requesting that order for oxygen state concentrator and portable.  Order has been corrected.  Nothing further is needed.

## 2019-04-21 NOTE — Progress Notes (Signed)
Name: Louis [name]   MRN: [id number]    DOB: 04/25/1960   Date:04/21/2019       Progress Note  Subjective  Chief Complaint  Chief Complaint  Patient presents with  . Hypertension    3 month follow   . Anemia    I connected with  [name] on 04/21/19 at  2:20 PM EST by telephone and verified that I am speaking with the correct person using two identifiers.  I discussed the limitations, risks, security and privacy concerns of performing an evaluation and management service by telephone and the availability of in person appointments. Staff also discussed with the patient that there may be a patient responsible charge related to this service. Patient Location: at dialysis center - in [place], training to start Peritoneal dialysis  Provider Location: [facility]   HPI  HTN: he is currently having peritoneal lavage and bp was 172/100, no chest pain or palpitation   ESRD: he started  HD back since 2018 , he is now getting switched to peritoneal lavages at home. He still works full time and it will be easier to keep up with his scheduled . He has anemia or chronic disease   SOB with activity: under the care of Dr. Chase Caller. He developed multifocal pneumonia March 2020, symptoms resolved after two rounds of antibiotics, but around 10/2018 he noticed increase in SOB with activity, dry cough he denies wheezing. He states the cough is so hard at times that gives him a headache. He had CT lung that showed emphysema, interstitial lung disease and lung nodule. He states dialysis does not improve symptoms. Denies orthopnea. Evaluated by cardiologist prior to going to pulmonologist. He is currently on Symbicort and Spiirva but states not really helping at this time  Gout: he has recurrent symptoms , but doing well since he is taking an otc juice   Patient Active Problem List   Diagnosis Date Noted  . Hypertensive urgency 03/05/2019  . Prolonged Q-T interval on ECG  06/15/2018  . Mild protein-calorie malnutrition ([place]) 05/01/2018  . Eczema 01/04/2018  . History of anemia due to CKD 05/24/2017  . ESRD on dialysis ([place]) 12/19/2016  . 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 Baker's cyst of knee, right 05/17/2016  . Neck muscle spasm 03/16/2016  . Hypertension 06/23/2010  . Rosacea 06/23/2010  . Human immunodeficiency virus (HIV) disease ([place]) 01/02/1996    Past Surgical History:  Procedure Laterality Date  . CAPD INSERTION N/A 12/28/2016   Procedure: LAPAROSCOPIC INSERTION CONTINUOUS AMBULATORY PERITONEAL DIALYSIS  (CAPD) CATHETER;  Surgeon: Algernon Huxley, MD;  Location: [facility] ORS;  Service: Vascular;  Laterality: N/A;  . DIALYSIS/PERMA CATHETER INSERTION N/A 10/09/2016   Procedure: DIALYSIS/PERMA CATHETER INSERTION;  Surgeon: Algernon Huxley, MD;  Location: [facility];  Service: Cardiovascular;  Laterality: N/A;  . DIALYSIS/PERMA CATHETER REMOVAL N/A 02/19/2017   Procedure: DIALYSIS/PERMA CATHETER REMOVAL;  Surgeon: Algernon Huxley, MD;  Location: [place] [facility];  Service: Cardiovascular;  Laterality: N/A;  . HERNIA REPAIR Bilateral    inguinal    Family History  Problem Relation Age of Onset  . Bradycardia Maternal Grandmother        with pacemaker   Social History   Tobacco Use  . Smoking status: Never Smoker  . Smokeless tobacco: Never Used  Substance Use Topics  . Alcohol use: Not Currently    Alcohol/week: 2.0 standard drinks    Types: 2 Standard drinks or  equivalent per week    Comment: has not had any alcohol in a while    Current Outpatient Medications:  .  amLODipine (NORVASC) 10 MG tablet, Take 1 tablet (10 mg total) by mouth daily., Disp: 30 tablet, Rfl: 0 .  AURYXIA 1 GM 210 MG(Fe) tablet, Take 1 tablet by mouth 4 (four) times daily -  before meals and at bedtime., Disp: , Rfl:  .  B Complex-C-Folic Acid (RENA-VITE RX) 1 MG TABS, Take 1 tablet by mouth daily., Disp: , Rfl:  .  budesonide-formoterol  (SYMBICORT) 80-4.5 MCG/ACT inhaler, Inhale 2 puffs into the lungs 2 (two) times daily., Disp: 1 Inhaler, Rfl: 12 .  cetirizine (ZYRTEC) 10 MG tablet, Take 1 tablet (10 mg total) by mouth daily., Disp: 30 tablet, Rfl: 11 .  cloNIDine (CATAPRES - DOSED IN MG/24 HR) 0.2 mg/24hr patch, Place 1 patch (0.2 mg total) onto the skin once a week., Disp: 4 patch, Rfl: 12 .  DESCOVY 200-25 MG tablet, Take 1 tablet by mouth daily., Disp: , Rfl:  .  diclofenac sodium (VOLTAREN) 1 % GEL, Apply 2 g topically 4 (four) times daily., Disp: 100 g, Rfl: 0 .  Febuxostat 80 MG TABS, Take 1 tablet by mouth daily., Disp: , Rfl:  .  ferrous sulfate 325 (65 FE) MG tablet, Take 1 tablet by mouth daily., Disp: , Rfl:  .  furosemide (LASIX) 40 MG tablet, Take 80 mg by mouth daily. , Disp: , Rfl:  .  halobetasol (ULTRAVATE) 0.05 % cream, APPLY TO AFFECTED AREA TWICE A DAY, Disp: , Rfl:  .  hydrALAZINE (APRESOLINE) 100 MG tablet, Take 150 mg by mouth 3 (three) times daily., Disp: , Rfl:  .  methocarbamol (ROBAXIN) 500 MG tablet, Take 500 mg by mouth 2 (two) times daily as needed. for pain, Disp: , Rfl:  .  SPIRIVA RESPIMAT 1.25 MCG/ACT AERS, INHALE 2 PUFFS BY MOUTH INTO THE LUNGS DAILY, Disp: 4 g, Rfl: 3 .  TIVICAY 50 MG tablet, Take 1 tablet by mouth daily., Disp: , Rfl:  .  valACYclovir (VALTREX) 500 MG tablet, Take 500 mg by mouth daily as needed (for flare ups). , Disp: , Rfl:  .  carvedilol (COREG) 25 MG tablet, Take 1 tablet (25 mg total) by mouth 2 (two) times daily., Disp: 180 tablet, Rfl: 3 .  predniSONE (DELTASONE) 10 MG tablet, Take 1 tablet (10 mg total) by mouth 2 (two) times daily as needed. Prn gout attack for 2 days (Patient not taking: Reported on 04/21/2019), Disp: 10 tablet, Rfl: 0  Allergies  Allergen Reactions  . Losartan Swelling    Lip swelling  . Quinapril Hcl Swelling    Face swells    I personally reviewed active problem list, medication list, allergies, family history, social history with the  patient/caregiver today.   ROS  Ten systems reviewed and is negative except as mentioned in HPI   Objective  Virtual encounter, vitals obtained at dialysis   Today's Vitals   04/21/19 1513  BP: (!) 172/100  Temp: 98.3 F (36.8 C)  Weight: 186 lb 4.6 oz (84.5 kg)   Body mass index is 23.92 kg/m.  Body mass index is 23.92 kg/m.  Physical Exam  Awake, alert and oriented  PHQ2/9: Depression screen [facility] 2/9 04/21/2019 01/21/2019 09/05/2018 08/19/2018 05/31/2018  Decreased Interest 0 0 0 0 0  Down, Depressed, Hopeless 0 0 0 0 0  PHQ - 2 Score 0 0 0 0 0  Altered sleeping 1 0  0 3 3  Tired, decreased energy 2 0 0 1 3  Change in appetite 0 0 0 1 3  Feeling bad or failure about yourself  0 0 0 0 0  Trouble concentrating 0 0 0 0 0  Moving slowly or fidgety/restless 0 0 0 0 0  Suicidal thoughts 0 0 0 0 0  PHQ-9 Score 3 0 0 5 9  Difficult doing work/chores Not difficult at all - Not difficult at all Not difficult at all -  Some recent data might be hidden   PHQ-2/9 Result is negative.    Fall Risk: Fall Risk  04/21/2019 09/05/2018 08/19/2018 05/31/2018 05/02/2018  Falls in the past year? 0 0 0 0 0  Number falls in past yr: 0 0 0 0 0  Injury with Fall? 0 0 0 0 0  Comment - - - - -  Follow up Falls evaluation completed - - - -     Assessment & Plan  1. ESRD on dialysis [facility])  Now seeing [facility] provider  2. Controlled gout  Takes prednisone prn   3. Uncontrolled hypertension  On higher dose of hydralazine given by nephrologist but bp is still elevated, he was at the dialysis center today   4. Atherosclerosis of aorta ([place])  On statin therapy   5. Centrilobular emphysema ([place])  Under the care of pulmonologist, states never smoked cigarettes but smoked pot  6. 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 Nodule of lower lobe of right lung  Keep follow up with pulmonologist   8. Colon cancer screening  He is not interested in colonoscopy, he  wants cologuard - Cologuard  I discussed the assessment and treatment plan with the patient. The patient was provided an opportunity to ask questions and all were answered. The patient agreed with the plan and demonstrated an understanding of the instructions.   The patient was advised to call back or seek an in-person evaluation if the symptoms worsen or if the condition fails to improve as anticipated.  I provided 25  minutes of non-face-to-face time during this encounter.  Loistine Chance, MD

## 2019-04-22 ENCOUNTER — Encounter: Payer: PRIVATE HEALTH INSURANCE | Attending: Internal Medicine | Admitting: *Deleted

## 2019-04-22 DIAGNOSIS — Z7952 Long term (current) use of systemic steroids: Secondary | ICD-10-CM | POA: Insufficient documentation

## 2019-04-22 DIAGNOSIS — M109 Gout, unspecified: Secondary | ICD-10-CM | POA: Insufficient documentation

## 2019-04-22 DIAGNOSIS — D689 Coagulation defect, unspecified: Secondary | ICD-10-CM | POA: Insufficient documentation

## 2019-04-22 DIAGNOSIS — J849 Interstitial pulmonary disease, unspecified: Secondary | ICD-10-CM | POA: Insufficient documentation

## 2019-04-22 DIAGNOSIS — N186 End stage renal disease: Secondary | ICD-10-CM | POA: Insufficient documentation

## 2019-04-22 DIAGNOSIS — Z992 Dependence on renal dialysis: Secondary | ICD-10-CM | POA: Diagnosis not present

## 2019-04-22 DIAGNOSIS — Z79899 Other long term (current) drug therapy: Secondary | ICD-10-CM | POA: Insufficient documentation

## 2019-04-22 DIAGNOSIS — I120 Hypertensive chronic kidney disease with stage 5 chronic kidney disease or end stage renal disease: Secondary | ICD-10-CM | POA: Insufficient documentation

## 2019-04-22 DIAGNOSIS — B20 Human immunodeficiency virus [HIV] disease: Secondary | ICD-10-CM | POA: Insufficient documentation

## 2019-04-22 NOTE — Progress Notes (Signed)
Virtual Orientation completed today. Appointment for EP eval and gym orientation set for 3/15 at 3 PM. Documentation of diagnosis can be found in [facility] 03/21/2019.

## 2019-04-23 ENCOUNTER — Other Ambulatory Visit: Admission: RE | Admit: 2019-04-23 | Payer: PRIVATE HEALTH INSURANCE | Source: Ambulatory Visit

## 2019-04-23 DIAGNOSIS — J449 Chronic obstructive pulmonary disease, unspecified: Secondary | ICD-10-CM | POA: Diagnosis not present

## 2019-04-23 DIAGNOSIS — Z992 Dependence on renal dialysis: Secondary | ICD-10-CM | POA: Diagnosis not present

## 2019-04-23 DIAGNOSIS — N186 End stage renal disease: Secondary | ICD-10-CM | POA: Diagnosis not present

## 2019-04-23 NOTE — Telephone Encounter (Signed)
Received call from [facility], stating that pt did not show for covid test.  I have spoken to pt, who stated that he did not make it for covid test due to having Dialysis. Pt is aware that PFT will need to be scheduled, as covid test is required.   Suanne Marker, can you reschedule PFT. Thanks.

## 2019-04-23 NOTE — Telephone Encounter (Signed)
Spoke with Louis [name] in Cardiopulmonary and she has canceled PFT. She will call me back with a new PFT appointment. Rhonda J Cobb

## 2019-04-24 ENCOUNTER — Ambulatory Visit: Payer: PRIVATE HEALTH INSURANCE

## 2019-04-24 DIAGNOSIS — N186 End stage renal disease: Secondary | ICD-10-CM | POA: Diagnosis not present

## 2019-04-24 DIAGNOSIS — Z992 Dependence on renal dialysis: Secondary | ICD-10-CM | POA: Diagnosis not present

## 2019-04-24 NOTE — Telephone Encounter (Signed)
Arlyce Harman and DLCO scheduled for 05/13/2019 at 2:00 at [facility]. Pt will need COVID test on 05/12/2019. ATC patient but went to VM. I left patient a message of both appointments and also mailed out information to patient. Nothing else needed at this time. Rhonda J Cobb

## 2019-04-25 DIAGNOSIS — N186 End stage renal disease: Secondary | ICD-10-CM | POA: Diagnosis not present

## 2019-04-25 DIAGNOSIS — Z992 Dependence on renal dialysis: Secondary | ICD-10-CM | POA: Diagnosis not present

## 2019-04-28 ENCOUNTER — Telehealth: Payer: Self-pay | Admitting: Internal Medicine

## 2019-04-28 ENCOUNTER — Other Ambulatory Visit: Payer: Self-pay

## 2019-04-28 VITALS — Ht 72.5 in | Wt 180.9 lb

## 2019-04-28 DIAGNOSIS — Z7952 Long term (current) use of systemic steroids: Secondary | ICD-10-CM | POA: Diagnosis not present

## 2019-04-28 DIAGNOSIS — B20 Human immunodeficiency virus [HIV] disease: Secondary | ICD-10-CM | POA: Diagnosis not present

## 2019-04-28 DIAGNOSIS — Z79899 Other long term (current) drug therapy: Secondary | ICD-10-CM | POA: Diagnosis not present

## 2019-04-28 DIAGNOSIS — N186 End stage renal disease: Secondary | ICD-10-CM | POA: Diagnosis not present

## 2019-04-28 DIAGNOSIS — D631 Anemia in chronic kidney disease: Secondary | ICD-10-CM | POA: Diagnosis not present

## 2019-04-28 DIAGNOSIS — M109 Gout, unspecified: Secondary | ICD-10-CM | POA: Diagnosis not present

## 2019-04-28 DIAGNOSIS — Z992 Dependence on renal dialysis: Secondary | ICD-10-CM | POA: Diagnosis not present

## 2019-04-28 DIAGNOSIS — I120 Hypertensive chronic kidney disease with stage 5 chronic kidney disease or end stage renal disease: Secondary | ICD-10-CM | POA: Diagnosis not present

## 2019-04-28 DIAGNOSIS — J849 Interstitial pulmonary disease, unspecified: Secondary | ICD-10-CM

## 2019-04-28 NOTE — Telephone Encounter (Signed)
Spoke to pt, who stated that he started wearing oxygen at night on Friday. When he woke up yesterday, he was experiencing soreness in his back near his lungs. Pt did not wear oxygen last night and did not experience any pain.   MR, please advise. Thanks

## 2019-04-28 NOTE — Progress Notes (Signed)
Pulmonary Individual Treatment Plan  Patient Details  Name: Louis [name] MRN: [id number] Date of Birth: Aug 18, 1960 Referring Provider:     Pulmonary Rehab from 04/28/2019 in [facility] Cardiac and Pulmonary Rehab  Referring Provider  Ramaswamy      Initial Encounter Date:    Pulmonary Rehab from 04/28/2019 in [facility] Cardiac and Pulmonary Rehab  Date  04/28/19      Visit Diagnosis: ILD (interstitial lung disease) ([place])  Patient's Home Medications on Admission:  Current Outpatient Medications:  .  amLODipine (NORVASC) 10 MG tablet, Take 1 tablet (10 mg total) by mouth daily., Disp: 30 tablet, Rfl: 0 .  AURYXIA 1 GM 210 MG(Fe) tablet, Take 1 tablet by mouth 4 (four) times daily -  before meals and at bedtime., Disp: , Rfl:  .  B Complex-C-Folic Acid (RENA-VITE RX) 1 MG TABS, Take 1 tablet by mouth daily., Disp: , Rfl:  .  budesonide-formoterol (SYMBICORT) 80-4.5 MCG/ACT inhaler, Inhale 2 puffs into the lungs 2 (two) times daily., Disp: 1 Inhaler, Rfl: 12 .  carvedilol (COREG) 25 MG tablet, Take 1 tablet (25 mg total) by mouth 2 (two) times daily., Disp: 180 tablet, Rfl: 3 .  cetirizine (ZYRTEC) 10 MG tablet, Take 1 tablet (10 mg total) by mouth daily., Disp: 30 tablet, Rfl: 11 .  cloNIDine (CATAPRES - DOSED IN MG/24 HR) 0.2 mg/24hr patch, Place 1 patch (0.2 mg total) onto the skin once a week., Disp: 4 patch, Rfl: 12 .  DESCOVY 200-25 MG tablet, Take 1 tablet by mouth daily., Disp: , Rfl:  .  diclofenac sodium (VOLTAREN) 1 % GEL, Apply 2 g topically 4 (four) times daily. (Patient not taking: Reported on 04/22/2019), Disp: 100 g, Rfl: 0 .  Febuxostat 80 MG TABS, Take 1 tablet by mouth daily., Disp: , Rfl:  .  ferrous sulfate 325 (65 FE) MG tablet, Take 1 tablet by mouth daily., Disp: , Rfl:  .  fluticasone (FLONASE) 50 MCG/ACT nasal spray, Place into the nose., Disp: , Rfl:  .  furosemide (LASIX) 40 MG tablet, Take 80 mg by mouth daily. , Disp: , Rfl:  .  halobetasol (ULTRAVATE) 0.05 % cream,  APPLY TO AFFECTED AREA TWICE A DAY, Disp: , Rfl:  .  hydrALAZINE (APRESOLINE) 100 MG tablet, Take 150 mg by mouth 3 (three) times daily., Disp: , Rfl:  .  methocarbamol (ROBAXIN) 500 MG tablet, Take 500 mg by mouth 2 (two) times daily as needed. for pain, Disp: , Rfl:  .  predniSONE (DELTASONE) 10 MG tablet, Take 1 tablet (10 mg total) by mouth 2 (two) times daily as needed. Prn gout attack for 2 days, Disp: 10 tablet, Rfl: 0 .  SPIRIVA RESPIMAT 1.25 MCG/ACT AERS, INHALE 2 PUFFS BY MOUTH INTO THE LUNGS DAILY, Disp: 4 g, Rfl: 3 .  TIVICAY 50 MG tablet, Take 1 tablet by mouth daily., Disp: , Rfl:  .  valACYclovir (VALTREX) 500 MG tablet, Take 500 mg by mouth daily as needed (for flare ups). , Disp: , Rfl:   Past Medical History: Past Medical History:  Diagnosis Date  . Chronic kidney disease    esrd  . Dermatophytosis of foot 06/23/2004  . Environmental allergies   . Gout   . HIV infection ([place]) 12/1995  . Hypertension   . Seborrhea 06/23/2004    Tobacco Use: Social History   Tobacco Use  Smoking Status Never Smoker  Smokeless Tobacco Never Used    Labs: Recent Review [employer]  for [facility] Cardiac and Pulmonary Rehab Latest Ref Rng & Units 06/09/2010 08/09/2011 08/23/2016   Cholestrol 0 - 200 mg/dL 186 179 -   LDLCALC 0 - 99 mg/dL 117(H) 107(H) -   HDL >39 mg/dL 55 54 -   Trlycerides <150 mg/dL 69 89 -   Hemoglobin A1c 4.8 - 5.6 % - - 5.1       Pulmonary Assessment Scores: Pulmonary Assessment Scores    Row Name 04/28/19 1634         ADL UCSD   SOB Score total  25     Rest  0     Walk  0     Stairs  2     Bath  0     Dress  0     Shop  2       CAT Score   CAT Score  16       mMRC Score   mMRC Score  0        UCSD: Self-administered rating of dyspnea associated with activities of daily living (ADLs) 6-point scale (0 = "not at all" to 5 = "maximal or unable to do because of breathlessness")  Scoring Scores range from 0 to 120.  Minimally important  difference is 5 units  CAT: CAT can identify the health impairment of COPD patients and is better correlated with disease progression.  CAT has a scoring range of zero to 40. The CAT score is classified into four groups of low (less than 10), medium (10 - 20), high (21-30) and very high (31-40) based on the impact level of disease on health status. A CAT score over 10 suggests significant symptoms.  A worsening CAT score could be explained by an exacerbation, poor medication adherence, poor inhaler technique, or progression of COPD or comorbid conditions.  CAT MCID is 2 points  mMRC: mMRC (Modified Medical Research Council) Dyspnea Scale is used to assess the degree of baseline functional disability in patients of respiratory disease due to dyspnea. No minimal important difference is established. A decrease in score of 1 point or greater is considered a positive change.   Pulmonary Function Assessment:   Exercise Target Goals: Exercise Program Goal: Individual exercise prescription set using results from initial 6 min walk test and THRR while considering  patient's activity barriers and safety.   Exercise Prescription Goal: Initial exercise prescription builds to 30-45 minutes a day of aerobic activity, 2-3 days per week.  Home exercise guidelines will be given to patient during program as part of exercise prescription that the participant will acknowledge.  Activity Barriers & Risk Stratification: Activity Barriers & Cardiac Risk Stratification - 04/22/19 1548      Activity Barriers & Cardiac Risk Stratification   Activity Barriers  Shortness of Breath   USed to be able to run a 4 min mile- 15 years ago.  Now hard to walk down driveway      6 Minute Walk: 6 Minute Walk    Row Name 04/28/19 1624         6 Minute Walk   Phase  Initial     Distance  1100 feet     Walk Time  6 minutes     # of Rest Breaks  0     MPH  2.08     METS  3.9     RPE  9     Perceived Dyspnea   0      VO2 Peak  13.64  Symptoms  No     Resting HR  62 bpm     Resting BP  148/84     Resting Oxygen Saturation   98 %     Exercise Oxygen Saturation  during 6 min walk  88 %     Max Ex. HR  90 bpm     Max Ex. BP  194/68     2 Minute Post BP  152/74       Interval HR   1 Minute HR  76     2 Minute HR  84     3 Minute HR  87     4 Minute HR  88     5 Minute HR  88     6 Minute HR  90     2 Minute Post HR  78     Interval Heart Rate?  Yes       Interval Oxygen   Interval Oxygen?  Yes     Baseline Oxygen Saturation %  98 %     1 Minute Oxygen Saturation %  96 %     1 Minute Liters of Oxygen  0 L     2 Minute Oxygen Saturation %  92 %     2 Minute Liters of Oxygen  0 L     3 Minute Oxygen Saturation %  91 %     3 Minute Liters of Oxygen  0 L     4 Minute Oxygen Saturation %  93 %     4 Minute Liters of Oxygen  0 L     5 Minute Oxygen Saturation %  88 %     5 Minute Liters of Oxygen  0 L     6 Minute Oxygen Saturation %  93 %     6 Minute Liters of Oxygen  0 L     2 Minute Post Oxygen Saturation %  94 %     2 Minute Post Liters of Oxygen  0 L       Oxygen Initial Assessment: Oxygen Initial Assessment - 04/22/19 1551      Home Oxygen   Home Oxygen Device  None    Sleep Oxygen Prescription  None    Home Exercise Oxygen Prescription  None    Home at Rest Exercise Oxygen Prescription  None    Compliance with Home Oxygen Use  Yes      Intervention   Short Term Goals  To learn and demonstrate proper pursed lip breathing techniques or other breathing techniques.;To learn and demonstrate proper use of respiratory medications    Long  Term Goals  Compliance with respiratory medication;Demonstrates proper use of MDI's;Exhibits proper breathing techniques, such as pursed lip breathing or other method taught during program session       Oxygen Re-Evaluation:   Oxygen Discharge (Final Oxygen Re-Evaluation):   Initial Exercise Prescription: Initial Exercise Prescription -  04/28/19 1600      Date of Initial Exercise RX and Referring Provider   Date  04/28/19    Referring Provider  Ramaswamy      Treadmill   MPH  2    Grade  3    Minutes  15    METs  3.36      Recumbant Bike   Level  3    RPM  60    Watts  35    Minutes  15    METs  3.3  Arm Ergometer   Level  1    RPM  30    Minutes  15    METs  3.3      REL-XR   Level  4    Speed  50    Minutes  15    METs  3.36      T5 Nustep   Level  2    SPM  80    Minutes  15    METs  3.3      Prescription Details   Frequency (times per week)  3    Duration  Progress to 30 minutes of continuous aerobic without signs/symptoms of physical distress      Intensity   THRR 40-80% of Max Heartrate  102-143    Ratings of Perceived Exertion  11-15    Perceived Dyspnea  0-4      Resistance Training   Training Prescription  Yes    Weight   5lb    Reps  10-15       Perform Capillary Blood Glucose checks as needed.  Exercise Prescription Changes: Exercise Prescription Changes    Row Name 04/28/19 1600             Response to Exercise   Blood Pressure (Admit)  148/84       Blood Pressure (Exercise)  194/68       Blood Pressure (Exit)  152/74       Heart Rate (Admit)  62 bpm       Heart Rate (Exercise)  90 bpm       Heart Rate (Exit)  78 bpm       Oxygen Saturation (Admit)  98 %       Oxygen Saturation (Exercise)  88 %       Oxygen Saturation (Exit)  94 %       Rating of Perceived Exertion (Exercise)  9       Perceived Dyspnea (Exercise)  0       Symptoms  none          Exercise Comments:   Exercise Goals and Review: Exercise Goals    Row Name 04/28/19 1632             Exercise Goals   Increase Physical Activity  Yes       Intervention  Provide advice, education, support and counseling about physical activity/exercise needs.;Develop an individualized exercise prescription for aerobic and resistive training based on initial evaluation findings, risk stratification,  comorbidities and participant's personal goals.       Expected Outcomes  Short Term: Attend rehab on a regular basis to increase amount of physical activity.;Long Term: Add in home exercise to make exercise part of routine and to increase amount of physical activity.;Long Term: Exercising regularly at least 3-5 days a week.       Increase Strength and Stamina  Yes       Intervention  Provide advice, education, support and counseling about physical activity/exercise needs.;Develop an individualized exercise prescription for aerobic and resistive training based on initial evaluation findings, risk stratification, comorbidities and participant's personal goals.       Expected Outcomes  Short Term: Increase workloads from initial exercise prescription for resistance, speed, and METs.;Short Term: Perform resistance training exercises routinely during rehab and add in resistance training at home;Long Term: Improve cardiorespiratory fitness, muscular endurance and strength as measured by increased METs and functional capacity (6MWT)       Able to understand  and use rate of perceived exertion (RPE) scale  Yes       Intervention  Provide education and explanation on how to use RPE scale       Expected Outcomes  Short Term: Able to use RPE daily in rehab to express subjective intensity level;Long Term:  Able to use RPE to guide intensity level when exercising independently       Able to understand and use Dyspnea scale  Yes       Intervention  Provide education and explanation on how to use Dyspnea scale       Expected Outcomes  Short Term: Able to use Dyspnea scale daily in rehab to express subjective sense of shortness of breath during exertion;Long Term: Able to use Dyspnea scale to guide intensity level when exercising independently       Knowledge and understanding of Target Heart Rate Range (THRR)  Yes       Intervention  Provide education and explanation of THRR including how the numbers were predicted and  where they are located for reference       Expected Outcomes  Short Term: Able to state/look up THRR;Short Term: Able to use daily as guideline for intensity in rehab;Long Term: Able to use THRR to govern intensity when exercising independently       Able to check pulse independently  Yes       Intervention  Provide education and demonstration on how to check pulse in carotid and radial arteries.;Review the importance of being able to check your own pulse for safety during independent exercise       Expected Outcomes  Short Term: Able to explain why pulse checking is important during independent exercise;Long Term: Able to check pulse independently and accurately       Understanding of Exercise Prescription  Yes       Intervention  Provide education, explanation, and written materials on patient's individual exercise prescription       Expected Outcomes  Short Term: Able to explain program exercise prescription;Long Term: Able to explain home exercise prescription to exercise independently          Exercise Goals Re-Evaluation :   Discharge Exercise Prescription (Final Exercise Prescription Changes): Exercise Prescription Changes - 04/28/19 1600      Response to Exercise   Blood Pressure (Admit)  148/84    Blood Pressure (Exercise)  194/68    Blood Pressure (Exit)  152/74    Heart Rate (Admit)  62 bpm    Heart Rate (Exercise)  90 bpm    Heart Rate (Exit)  78 bpm    Oxygen Saturation (Admit)  98 %    Oxygen Saturation (Exercise)  88 %    Oxygen Saturation (Exit)  94 %    Rating of Perceived Exertion (Exercise)  9    Perceived Dyspnea (Exercise)  0    Symptoms  none       Nutrition:  Target Goals: Understanding of nutrition guidelines, daily intake of sodium '1500mg'$ , cholesterol '200mg'$ , calories 30% from fat and 7% or less from saturated fats, daily to have 5 or more servings of fruits and vegetables.  Biometrics: Pre Biometrics - 04/28/19 1636      Pre Biometrics   Height  6'  0.5" (1.842 m)    Weight  180 lb 14.4 oz (82.1 kg)    BMI (Calculated)  24.18        Nutrition Therapy Plan and Nutrition Goals:   Nutrition Assessments: Nutrition Assessments -  04/28/19 1635      MEDFICTS Scores   Pre Score  44       Nutrition Goals Re-Evaluation:   Nutrition Goals Discharge (Final Nutrition Goals Re-Evaluation):   Psychosocial: Target Goals: Acknowledge presence or absence of significant depression and/or stress, maximize coping skills, provide positive support system. Participant is able to verbalize types and ability to use techniques and skills needed for reducing stress and depression.   Initial Review & Psychosocial Screening: Initial Psych Review & Screening - 04/22/19 1552      Initial Review   Current issues with  None Identified      Family Dynamics   Good Support System?  Yes   Mom and Aunt .     Barriers   Psychosocial barriers to participate in program  There are no identifiable barriers or psychosocial needs.;The patient should benefit from training in stress management and relaxation.      Screening Interventions   Interventions  Encouraged to exercise;To provide support and resources with identified psychosocial needs;Provide feedback about the scores to participant    Expected Outcomes  Short Term goal: Utilizing psychosocial counselor, staff and physician to assist with identification of specific Stressors or current issues interfering with healing process. Setting desired goal for each stressor or current issue identified.;Long Term Goal: Stressors or current issues are controlled or eliminated.;Short Term goal: Identification and review with participant of any Quality of Life or Depression concerns found by scoring the questionnaire.;Long Term goal: The participant improves quality of Life and PHQ9 Scores as seen by post scores and/or verbalization of changes       Quality of Life Scores:  Scores of 19 and below usually indicate a  poorer quality of life in these areas.  A difference of  2-3 points is a clinically meaningful difference.  A difference of 2-3 points in the total score of the Quality of Life Index has been associated with significant improvement in overall quality of life, self-image, physical symptoms, and general health in studies assessing change in quality of life.  PHQ-9: Recent Review Flowsheet Data    Depression screen [facility] 2/9 04/28/2019 04/21/2019 01/21/2019 09/05/2018 08/19/2018   Decreased Interest 0 0 0 0 0   Down, Depressed, Hopeless 0 0 0 0 0   PHQ - 2 Score 0 0 0 0 0   Altered sleeping 1 1 0 0 3   Tired, decreased energy 1 2 0 0 1   Change in appetite 0 0 0 0 1   Feeling bad or failure about yourself  0 0 0 0 0   Trouble concentrating 0 0 0 0 0   Moving slowly or fidgety/restless 0 0 0 0 0   Suicidal thoughts 0 0 0 0 0   PHQ-9 Score 2 3 0 0 5   Difficult doing work/chores Not difficult at all Not difficult at all - Not difficult at all Not difficult at all     Interpretation of Total Score  Total Score Depression Severity:  1-4 = Minimal depression, 5-9 = Mild depression, 10-14 = Moderate depression, 15-19 = Moderately severe depression, 20-27 = Severe depression   Psychosocial Evaluation and Intervention: Psychosocial Evaluation - 04/22/19 1607      Psychosocial Evaluation & Interventions   Interventions  Encouraged to exercise with the program and follow exercise prescription    Comments  Louis [name] has no barriers to attending the Pulmonary Rehab program. He is in oprocess of being on Kidney Transplant list and understands this  program can help him be more physically prepared when he is called for a transplant. He lives with a roommate and has a good support system with his Mom and Elenor Legato. He is learning a home dialysis process so he can do his own dialysuis and save time not being required to go to a dialysis facility. He should do well with the program.    Expected Outcomes  STG: Louis [name] is able to  schedule and attend at least 2 sessions a week, hoping to be 3 sessions a week.  LTG: Louis [name] has the tools and resources when he graduates to continue to fight his lung disease and complete his ADLs without tooo much effort.  He is able to walk further than his driveway before getting SOB.    Continue Psychosocial Services   Follow up required by staff       Psychosocial Re-Evaluation:   Psychosocial Discharge (Final Psychosocial Re-Evaluation):   Education: Education Goals: Education classes will be provided on a weekly basis, covering required topics. Participant will state understanding/return demonstration of topics presented.  Learning Barriers/Preferences: Learning Barriers/Preferences - 04/22/19 1554      Learning Barriers/Preferences   Learning Barriers  None    Learning Preferences  None       Education Topics:  Initial Evaluation Education: - Verbal, written and demonstration of respiratory meds, oximetry and breathing techniques. Instruction on use of nebulizers and MDIs and importance of monitoring MDI activations.   General Nutrition Guidelines/Fats and Fiber: -Group instruction provided by verbal, written material, models and posters to present the general guidelines for heart healthy nutrition. Gives an explanation and review of dietary fats and fiber.   Controlling Sodium/Reading Food Labels: -Group verbal and written material supporting the discussion of sodium use in heart healthy nutrition. Review and explanation with models, verbal and written materials for utilization of the food label.   Exercise Physiology & General Exercise Guidelines: - Group verbal and written instruction with models to review the exercise physiology of the cardiovascular system and associated critical values. Provides general exercise guidelines with specific guidelines to those with heart or lung disease.    Aerobic Exercise & Resistance Training: - Gives group verbal and written  instruction on the various components of exercise. Focuses on aerobic and resistive training programs and the benefits of this training and how to safely progress through these programs.   Flexibility, Balance, Mind/Body Relaxation: Provides group verbal/written instruction on the benefits of flexibility and balance training, including mind/body exercise modes such as yoga, pilates and tai chi.  Demonstration and skill practice provided.   Stress and Anxiety: - Provides group verbal and written instruction about the health risks of elevated stress and causes of high stress.  Discuss the correlation between heart/lung disease and anxiety and treatment options. Review healthy ways to manage with stress and anxiety.   Depression: - Provides group verbal and written instruction on the correlation between heart/lung disease and depressed mood, treatment options, and the stigmas associated with seeking treatment.   Exercise & Equipment Safety: - Individual verbal instruction and demonstration of equipment use and safety with use of the equipment.   Pulmonary Rehab from 04/28/2019 in [facility] Cardiac and Pulmonary Rehab  Date  04/28/19  Educator  AS  Instruction Review Code  1- Verbalizes Understanding      Infection Prevention: - Provides verbal and written material to individual with discussion of infection control including proper hand washing and proper equipment cleaning during exercise session.   Pulmonary Rehab  from 04/28/2019 in [facility] Cardiac and Pulmonary Rehab  Date  04/28/19  Educator  AS  Instruction Review Code  1- Verbalizes Understanding      Falls Prevention: - Provides verbal and written material to individual with discussion of falls prevention and safety.   Pulmonary Rehab from 04/28/2019 in [facility] Cardiac and Pulmonary Rehab  Date  04/28/19  Educator  AS  Instruction Review Code  1- Verbalizes Understanding      Diabetes: - Individual verbal and written instruction to  review signs/symptoms of diabetes, desired ranges of glucose level fasting, after meals and with exercise. Advice that pre and post exercise glucose checks will be done for 3 sessions at entry of program.   Chronic Lung Diseases: - Group verbal and written instruction to review updates, respiratory medications, advancements in procedures and treatments. Discuss use of supplemental oxygen including available portable oxygen systems, continuous and intermittent flow rates, concentrators, personal use and safety guidelines. Review proper use of inhaler and spacers. Provide informative websites for self-education.    Energy Conservation: - Provide group verbal and written instruction for methods to conserve energy, plan and organize activities. Instruct on pacing techniques, use of adaptive equipment and posture/positioning to relieve shortness of breath.   Triggers and Exacerbations: - Group verbal and written instruction to review types of environmental triggers and ways to prevent exacerbations. Discuss weather changes, air quality and the benefits of nasal washing. Review warning signs and symptoms to help prevent infections. Discuss techniques for effective airway clearance, coughing, and vibrations.   AED/CPR: - Group verbal and written instruction with the use of models to demonstrate the basic use of the AED with the basic ABC's of resuscitation.   Anatomy and Physiology of the Lungs: - Group verbal and written instruction with the use of models to provide basic lung anatomy and physiology related to function, structure and complications of lung disease.   Anatomy & Physiology of the Heart: - Group verbal and written instruction and models provide basic cardiac anatomy and physiology, with the coronary electrical and arterial systems. Review of Valvular disease and Heart Failure   Cardiac Medications: - Group verbal and written instruction to review commonly prescribed medications for  heart disease. Reviews the medication, class of the drug, and side effects.   Know Your Numbers and Risk Factors: -Group verbal and written instruction about important numbers in your health.  Discussion of what are risk factors and how they play a role in the disease process.  Review of Cholesterol, Blood Pressure, Diabetes, and BMI and the role they play in your overall health.   Sleep Hygiene: -Provides group verbal and written instruction about how sleep can affect your health.  Define sleep hygiene, discuss sleep cycles and impact of sleep habits. Review good sleep hygiene tips.    Other: -Provides group and verbal instruction on various topics (see comments)    Knowledge Questionnaire Score: Knowledge Questionnaire Score - 04/28/19 1633      Knowledge Questionnaire Score   Pre Score  15/18        Core Components/Risk Factors/Patient Goals at Admission: Personal Goals and Risk Factors at Admission - 04/22/19 1555      Core Components/Risk Factors/Patient Goals on Admission    Weight Management  Weight Gain;Yes    Intervention  Weight Management: Develop a combined nutrition and exercise program designed to reach desired caloric intake, while maintaining appropriate intake of nutrient and fiber, sodium and fats, and appropriate energy expenditure required for the weight goal.;Weight  Management: Provide education and appropriate resources to help participant work on and attain dietary goals.    Admit Weight  185 lb (83.9 kg)    Goal Weight: Long Term  210 lb (95.3 kg)    Expected Outcomes  Short Term: Continue to assess and modify interventions until short term weight is achieved;Long Term: Adherence to nutrition and physical activity/exercise program aimed toward attainment of established weight goal;Weight Gain: Understanding of general recommendations for a high calorie, high protein meal plan that promotes weight gain by distributing calorie intake throughout the day with the  consumption for 4-5 meals, snacks, and/or supplements    Heart Failure  Yes    Intervention  Provide a combined exercise and nutrition program that is supplemented with education, support and counseling about heart failure. Directed toward relieving symptoms such as shortness of breath, decreased exercise tolerance, and extremity edema.    Expected Outcomes  Improve functional capacity of life;Short term: Attendance in program 2-3 days a week with increased exercise capacity. Reported lower sodium intake. Reported increased fruit and vegetable intake. Reports medication compliance.;Short term: Daily weights obtained and reported for increase. Utilizing diuretic protocols set by physician.;Long term: Adoption of self-care skills and reduction of barriers for early signs and symptoms recognition and intervention leading to self-care maintenance.    Hypertension  Yes    Intervention  Provide education on lifestyle modifcations including regular physical activity/exercise, weight management, moderate sodium restriction and increased consumption of fresh fruit, vegetables, and low fat dairy, alcohol moderation, and smoking cessation.;Monitor prescription use compliance.    Expected Outcomes  Short Term: Continued assessment and intervention until BP is < 140/47m HG in hypertensive participants. < 130/862mHG in hypertensive participants with diabetes, heart failure or chronic kidney disease.;Long Term: Maintenance of blood pressure at goal levels.       Core Components/Risk Factors/Patient Goals Review:    Core Components/Risk Factors/Patient Goals at Discharge (Final Review):    ITP Comments: ITP Comments    Row Name 04/22/19 1605           ITP Comments  Virtual Orientation completed today. Appointment for EP eval and gym orientation set for 3/15 at 3 PM. [facility]          Comments: initial ITP

## 2019-04-28 NOTE — Patient Instructions (Signed)
Patient Instructions  Patient Details  Name: Louis [name] MRN: [id number] Date of Birth: [birth year] Referring Provider:  Brand Males, MD  Below are your personal goals for exercise, nutrition, and risk factors. Our goal is to help you stay on track towards obtaining and maintaining these goals. We will be discussing your progress on these goals with you throughout the program.  Initial Exercise Prescription: Initial Exercise Prescription - 04/28/19 1600      Date of Initial Exercise RX and Referring Provider   Date  04/28/19    Referring Provider  Ramaswamy      Treadmill   MPH  2    Grade  3    Minutes  15    METs  3.36      Recumbant Bike   Level  3    RPM  60    Watts  35    Minutes  15    METs  3.3      Arm Ergometer   Level  1    RPM  30    Minutes  15    METs  3.3      REL-XR   Level  4    Speed  50    Minutes  15    METs  3.36      T5 Nustep   Level  2    SPM  80    Minutes  15    METs  3.3      Prescription Details   Frequency (times per week)  3    Duration  Progress to 30 minutes of continuous aerobic without signs/symptoms of physical distress      Intensity   THRR 40-80% of Max Heartrate  102-143    Ratings of Perceived Exertion  11-15    Perceived Dyspnea  0-4      Resistance Training   Training Prescription  Yes    Weight   5lb    Reps  10-15       Exercise Goals: Frequency: Be able to perform aerobic exercise two to three times per week in program working toward 2-5 days per week of home exercise.  Intensity: Work with a perceived exertion of 11 (fairly light) - 15 (hard) while following your exercise prescription.  We will make changes to your prescription with you as you progress through the program.   Duration: Be able to do 30 to 45 minutes of continuous aerobic exercise in addition to a 5 minute warm-up and a 5 minute cool-down routine.   Nutrition Goals: Your personal nutrition goals will be established when you do  your nutrition analysis with the dietician.  The following are general nutrition guidelines to follow: Cholesterol < 200mg /day Sodium < 1500mg /day Fiber: Men over 50 yrs - 30 grams per day  Personal Goals: Personal Goals and Risk Factors at Admission - 04/28/19 1640      Core Components/Risk Factors/Patient Goals on Admission    Weight Management  Weight Gain;Yes    Intervention  Weight Management: Develop a combined nutrition and exercise program designed to reach desired caloric intake, while maintaining appropriate intake of nutrient and fiber, sodium and fats, and appropriate energy expenditure required for the weight goal.;Weight Management: Provide education and appropriate resources to help participant work on and attain dietary goals.    Admit Weight  180 lb 14.4 oz (82.1 kg)    Goal Weight: Short Term  190 lb (86.2 kg)    Goal Weight: Long Term  210 lb (95.3 kg)    Expected Outcomes  Short Term: Continue to assess and modify interventions until short term weight is achieved;Long Term: Adherence to nutrition and physical activity/exercise program aimed toward attainment of established weight goal;Weight Gain: Understanding of general recommendations for a high calorie, high protein meal plan that promotes weight gain by distributing calorie intake throughout the day with the consumption for 4-5 meals, snacks, and/or supplements    Heart Failure  Yes    Intervention  Provide a combined exercise and nutrition program that is supplemented with education, support and counseling about heart failure. Directed toward relieving symptoms such as shortness of breath, decreased exercise tolerance, and extremity edema.    Expected Outcomes  Improve functional capacity of life;Short term: Attendance in program 2-3 days a week with increased exercise capacity. Reported lower sodium intake. Reported increased fruit and vegetable intake. Reports medication compliance.;Short term: Daily weights obtained  and reported for increase. Utilizing diuretic protocols set by physician.;Long term: Adoption of self-care skills and reduction of barriers for early signs and symptoms recognition and intervention leading to self-care maintenance.       Tobacco Use Initial Evaluation: Social History   Tobacco Use  Smoking Status Never Smoker  Smokeless Tobacco Never Used    Exercise Goals and Review: Exercise Goals    Row Name 04/28/19 1632             Exercise Goals   Increase Physical Activity  Yes       Intervention  Provide advice, education, support and counseling about physical activity/exercise needs.;Develop an individualized exercise prescription for aerobic and resistive training based on initial evaluation findings, risk stratification, comorbidities and participant's personal goals.       Expected Outcomes  Short Term: Attend rehab on a regular basis to increase amount of physical activity.;Long Term: Add in home exercise to make exercise part of routine and to increase amount of physical activity.;Long Term: Exercising regularly at least 3-5 days a week.       Increase Strength and Stamina  Yes       Intervention  Provide advice, education, support and counseling about physical activity/exercise needs.;Develop an individualized exercise prescription for aerobic and resistive training based on initial evaluation findings, risk stratification, comorbidities and participant's personal goals.       Expected Outcomes  Short Term: Increase workloads from initial exercise prescription for resistance, speed, and METs.;Short Term: Perform resistance training exercises routinely during rehab and add in resistance training at home;Long Term: Improve cardiorespiratory fitness, muscular endurance and strength as measured by increased METs and functional capacity (6MWT)       Able to understand and use rate of perceived exertion (RPE) scale  Yes       Intervention  Provide education and explanation on how  to use RPE scale       Expected Outcomes  Short Term: Able to use RPE daily in rehab to express subjective intensity level;Long Term:  Able to use RPE to guide intensity level when exercising independently       Able to understand and use Dyspnea scale  Yes       Intervention  Provide education and explanation on how to use Dyspnea scale       Expected Outcomes  Short Term: Able to use Dyspnea scale daily in rehab to express subjective sense of shortness of breath during exertion;Long Term: Able to use Dyspnea scale to guide intensity level when exercising independently  Knowledge and understanding of Target Heart Rate Range (THRR)  Yes       Intervention  Provide education and explanation of THRR including how the numbers were predicted and where they are located for reference       Expected Outcomes  Short Term: Able to state/look up THRR;Short Term: Able to use daily as guideline for intensity in rehab;Long Term: Able to use THRR to govern intensity when exercising independently       Able to check pulse independently  Yes       Intervention  Provide education and demonstration on how to check pulse in carotid and radial arteries.;Review the importance of being able to check your own pulse for safety during independent exercise       Expected Outcomes  Short Term: Able to explain why pulse checking is important during independent exercise;Long Term: Able to check pulse independently and accurately       Understanding of Exercise Prescription  Yes       Intervention  Provide education, explanation, and written materials on patient's individual exercise prescription       Expected Outcomes  Short Term: Able to explain program exercise prescription;Long Term: Able to explain home exercise prescription to exercise independently          Copy of goals given to participant.

## 2019-04-28 NOTE — Telephone Encounter (Signed)
Lm for pt

## 2019-04-29 ENCOUNTER — Encounter: Admit: 2019-04-29 | Discharge: 2019-04-30 | Payer: MEDICARE | Attending: Clinical | Primary: Clinical

## 2019-04-29 ENCOUNTER — Ambulatory Visit: Admit: 2019-04-29 | Discharge: 2019-04-30 | Payer: MEDICARE

## 2019-04-29 ENCOUNTER — Telehealth: Payer: Self-pay | Admitting: Internal Medicine

## 2019-04-29 DIAGNOSIS — N186 End stage renal disease: Secondary | ICD-10-CM | POA: Diagnosis not present

## 2019-04-29 DIAGNOSIS — Z992 Dependence on renal dialysis: Secondary | ICD-10-CM | POA: Diagnosis not present

## 2019-04-29 DIAGNOSIS — D631 Anemia in chronic kidney disease: Secondary | ICD-10-CM | POA: Diagnosis not present

## 2019-04-30 DIAGNOSIS — N186 End stage renal disease: Secondary | ICD-10-CM | POA: Diagnosis not present

## 2019-04-30 DIAGNOSIS — D631 Anemia in chronic kidney disease: Secondary | ICD-10-CM | POA: Insufficient documentation

## 2019-04-30 DIAGNOSIS — Z992 Dependence on renal dialysis: Secondary | ICD-10-CM | POA: Diagnosis not present

## 2019-05-02 DIAGNOSIS — N186 End stage renal disease: Secondary | ICD-10-CM | POA: Diagnosis not present

## 2019-05-02 DIAGNOSIS — Z992 Dependence on renal dialysis: Secondary | ICD-10-CM | POA: Diagnosis not present

## 2019-05-02 DIAGNOSIS — D631 Anemia in chronic kidney disease: Secondary | ICD-10-CM | POA: Diagnosis not present

## 2019-05-02 NOTE — Telephone Encounter (Signed)
I called and spoke with patient and made him aware of Dr. Golden Pop recommendations. He verbalized understanding.

## 2019-05-02 NOTE — Telephone Encounter (Signed)
I think this is unrelated.  He could try his oxygen on and see if his shortness comes back

## 2019-05-05 ENCOUNTER — Other Ambulatory Visit: Payer: Self-pay

## 2019-05-05 ENCOUNTER — Encounter: Payer: PRIVATE HEALTH INSURANCE | Admitting: *Deleted

## 2019-05-05 DIAGNOSIS — N186 End stage renal disease: Secondary | ICD-10-CM | POA: Diagnosis not present

## 2019-05-05 DIAGNOSIS — J849 Interstitial pulmonary disease, unspecified: Secondary | ICD-10-CM | POA: Diagnosis not present

## 2019-05-05 DIAGNOSIS — L400 Psoriasis vulgaris: Secondary | ICD-10-CM | POA: Diagnosis not present

## 2019-05-05 DIAGNOSIS — Z992 Dependence on renal dialysis: Secondary | ICD-10-CM | POA: Diagnosis not present

## 2019-05-05 NOTE — Progress Notes (Signed)
Daily Session Note  Patient Details  Name: Louis [name] MRN: [id number] Date of Birth: 07/05/1960 Referring Provider:     Pulmonary Rehab from 04/28/2019 in [facility]  Referring Provider  Ramaswamy      Encounter Date: 05/05/2019  Check In: Session Check In - 05/05/19 1553      Check-In   Supervising physician immediately available to respond to emergencies  See telemetry face sheet for immediately available ER MD    Location  [facility] & Pul[facility]    Staff Present  Renita Papa, RN Moises Blood, BS, ACSM CEP, Exercise Physiologist;Joseph Darrin Nipper, [place], RCEP, CCRP, CCET    Virtual Visit  No    Medication changes reported      No    Fall or balance concerns reported     No    Warm-up and Cool-down  Performed on first and last piece of equipment    Resistance Training Performed  Yes    VAD Patient?  No    PAD/SET Patient?  No      Pain Assessment   Currently in Pain?  No/denies          Social History   Tobacco Use  Smoking Status Never Smoker  Smokeless Tobacco Never Used    Goals Met:  Proper associated with RPD/PD & O2 Sat Independence with exercise equipment Using PLB without cueing & demonstrates good technique Exercise tolerated well No report of cardiac concerns or symptoms Strength training completed today  Goals Unmet:  Not Applicable  Comments: First full day of exercise!  Patient was oriented to gym and equipment including functions, settings, policies, and procedures.  Patient's individual exercise prescription and treatment plan were reviewed.  All starting workloads were established based on the results of the 6 minute walk test done at initial orientation visit.  The plan for exercise progression was also introduced and progression will be customized based on patient's performance and goals.    Dr. Emily Filbert is Medical Director for [place] and [facility]  Pulmonary Rehabilitation.

## 2019-05-05 NOTE — Telephone Encounter (Signed)
Forms being sent back to [employer]

## 2019-05-06 MED ORDER — CLOBETASOL 0.05 % TOPICAL OINTMENT
Freq: Two times a day (BID) | TOPICAL | 5 refills | 0.00000 days | Status: CP
Start: 2019-05-06 — End: 2020-05-05

## 2019-05-06 MED ORDER — CALCIPOTRIENE 0.005 % TOPICAL OINTMENT
Freq: Two times a day (BID) | TOPICAL | 5 refills | 0.00000 days | Status: CP
Start: 2019-05-06 — End: 2020-05-05

## 2019-05-07 ENCOUNTER — Encounter: Payer: Self-pay | Admitting: *Deleted

## 2019-05-07 DIAGNOSIS — K029 Dental caries, unspecified: Secondary | ICD-10-CM | POA: Diagnosis not present

## 2019-05-07 DIAGNOSIS — J849 Interstitial pulmonary disease, unspecified: Secondary | ICD-10-CM

## 2019-05-07 DIAGNOSIS — N186 End stage renal disease: Secondary | ICD-10-CM | POA: Diagnosis not present

## 2019-05-07 DIAGNOSIS — Z992 Dependence on renal dialysis: Secondary | ICD-10-CM | POA: Diagnosis not present

## 2019-05-07 NOTE — Progress Notes (Signed)
Pulmonary Individual Treatment Plan  Patient Details  Name: JAIMIE REDDITT MRN: [id number] Date of Birth: 30-Aug-1960 Referring Provider:     Pulmonary Rehab from 04/28/2019 in [facility] Cardiac and Pulmonary Rehab  Referring Provider  Ramaswamy      Initial Encounter Date:    Pulmonary Rehab from 04/28/2019 in [facility] Cardiac and Pulmonary Rehab  Date  04/28/19      Visit Diagnosis: ILD (interstitial lung disease) ([place])  Patient's Home Medications on Admission:  Current Outpatient Medications:  .  amLODipine (NORVASC) 10 MG tablet, Take 1 tablet (10 mg total) by mouth daily., Disp: 30 tablet, Rfl: 0 .  AURYXIA 1 GM 210 MG(Fe) tablet, Take 1 tablet by mouth 4 (four) times daily -  before meals and at bedtime., Disp: , Rfl:  .  B Complex-C-Folic Acid (RENA-VITE RX) 1 MG TABS, Take 1 tablet by mouth daily., Disp: , Rfl:  .  budesonide-formoterol (SYMBICORT) 80-4.5 MCG/ACT inhaler, Inhale 2 puffs into the lungs 2 (two) times daily., Disp: 1 Inhaler, Rfl: 12 .  carvedilol (COREG) 25 MG tablet, Take 1 tablet (25 mg total) by mouth 2 (two) times daily., Disp: 180 tablet, Rfl: 3 .  cetirizine (ZYRTEC) 10 MG tablet, Take 1 tablet (10 mg total) by mouth daily., Disp: 30 tablet, Rfl: 11 .  cloNIDine (CATAPRES - DOSED IN MG/24 HR) 0.2 mg/24hr patch, Place 1 patch (0.2 mg total) onto the skin once a week., Disp: 4 patch, Rfl: 12 .  DESCOVY 200-25 MG tablet, Take 1 tablet by mouth daily., Disp: , Rfl:  .  diclofenac sodium (VOLTAREN) 1 % GEL, Apply 2 g topically 4 (four) times daily. (Patient not taking: Reported on 04/22/2019), Disp: 100 g, Rfl: 0 .  Febuxostat 80 MG TABS, Take 1 tablet by mouth daily., Disp: , Rfl:  .  ferrous sulfate 325 (65 FE) MG tablet, Take 1 tablet by mouth daily., Disp: , Rfl:  .  fluticasone (FLONASE) 50 MCG/ACT nasal spray, Place into the nose., Disp: , Rfl:  .  furosemide (LASIX) 40 MG tablet, Take 80 mg by mouth daily. , Disp: , Rfl:  .  halobetasol (ULTRAVATE) 0.05 % cream,  APPLY TO AFFECTED AREA TWICE A DAY, Disp: , Rfl:  .  hydrALAZINE (APRESOLINE) 100 MG tablet, Take 150 mg by mouth 3 (three) times daily., Disp: , Rfl:  .  methocarbamol (ROBAXIN) 500 MG tablet, Take 500 mg by mouth 2 (two) times daily as needed. for pain, Disp: , Rfl:  .  predniSONE (DELTASONE) 10 MG tablet, Take 1 tablet (10 mg total) by mouth 2 (two) times daily as needed. Prn gout attack for 2 days, Disp: 10 tablet, Rfl: 0 .  SPIRIVA RESPIMAT 1.25 MCG/ACT AERS, INHALE 2 PUFFS BY MOUTH INTO THE LUNGS DAILY, Disp: 4 g, Rfl: 3 .  TIVICAY 50 MG tablet, Take 1 tablet by mouth daily., Disp: , Rfl:  .  valACYclovir (VALTREX) 500 MG tablet, Take 500 mg by mouth daily as needed (for flare ups). , Disp: , Rfl:   Past Medical History: Past Medical History:  Diagnosis Date  . Chronic kidney disease    esrd  . Dermatophytosis of foot 06/23/2004  . Environmental allergies   . Gout   . HIV infection ([place]) 12/1995  . Hypertension   . Seborrhea 06/23/2004    Tobacco Use: Social History   Tobacco Use  Smoking Status Never Smoker  Smokeless Tobacco Never Used    Labs: Recent Review [employer]  for ITP Cardiac and Pulmonary Rehab Latest Ref Rng & Units 06/09/2010 08/09/2011 08/23/2016   Cholestrol 0 - 200 mg/dL 186 179 -   LDLCALC 0 - 99 mg/dL 117(H) 107(H) -   HDL >39 mg/dL 55 54 -   Trlycerides <150 mg/dL 69 89 -   Hemoglobin A1c 4.8 - 5.6 % - - 5.1       Pulmonary Assessment Scores: Pulmonary Assessment Scores    Row Name 04/28/19 1634         ADL UCSD   SOB Score total  25     Rest  0     Walk  0     Stairs  2     Bath  0     Dress  0     Shop  2       CAT Score   CAT Score  16       mMRC Score   mMRC Score  0        UCSD: Self-administered rating of dyspnea associated with activities of daily living (ADLs) 6-point scale (0 = "not at all" to 5 = "maximal or unable to do because of breathlessness")  Scoring Scores range from 0 to 120.  Minimally important  difference is 5 units  CAT: CAT can identify the health impairment of COPD patients and is better correlated with disease progression.  CAT has a scoring range of zero to 40. The CAT score is classified into four groups of low (less than 10), medium (10 - 20), high (21-30) and very high (31-40) based on the impact level of disease on health status. A CAT score over 10 suggests significant symptoms.  A worsening CAT score could be explained by an exacerbation, poor medication adherence, poor inhaler technique, or progression of COPD or comorbid conditions.  CAT MCID is 2 points  mMRC: mMRC (Modified Medical Research Council) Dyspnea Scale is used to assess the degree of baseline functional disability in patients of respiratory disease due to dyspnea. No minimal important difference is established. A decrease in score of 1 point or greater is considered a positive change.   Pulmonary Function Assessment:   Exercise Target Goals: Exercise Program Goal: Individual exercise prescription set using results from initial 6 min walk test and THRR while considering  patient's activity barriers and safety.   Exercise Prescription Goal: Initial exercise prescription builds to 30-45 minutes a day of aerobic activity, 2-3 days per week.  Home exercise guidelines will be given to patient during program as part of exercise prescription that the participant will acknowledge.  Activity Barriers & Risk Stratification: Activity Barriers & Cardiac Risk Stratification - 04/22/19 1548      Activity Barriers & Cardiac Risk Stratification   Activity Barriers  Shortness of Breath   USed to be able to run a 4 min mile- 15 years ago.  Now hard to walk down driveway      6 Minute Walk: 6 Minute Walk    Row Name 04/28/19 1624         6 Minute Walk   Phase  Initial     Distance  1100 feet     Walk Time  6 minutes     # of Rest Breaks  0     MPH  2.08     METS  3.9     RPE  9     Perceived Dyspnea   0      VO2 Peak  13.64  Symptoms  No     Resting HR  62 bpm     Resting BP  148/84     Resting Oxygen Saturation   98 %     Exercise Oxygen Saturation  during 6 min walk  88 %     Max Ex. HR  90 bpm     Max Ex. BP  194/68     2 Minute Post BP  152/74       Interval HR   1 Minute HR  76     2 Minute HR  84     3 Minute HR  87     4 Minute HR  88     5 Minute HR  88     6 Minute HR  90     2 Minute Post HR  78     Interval Heart Rate?  Yes       Interval Oxygen   Interval Oxygen?  Yes     Baseline Oxygen Saturation %  98 %     1 Minute Oxygen Saturation %  96 %     1 Minute Liters of Oxygen  0 L     2 Minute Oxygen Saturation %  92 %     2 Minute Liters of Oxygen  0 L     3 Minute Oxygen Saturation %  91 %     3 Minute Liters of Oxygen  0 L     4 Minute Oxygen Saturation %  93 %     4 Minute Liters of Oxygen  0 L     5 Minute Oxygen Saturation %  88 %     5 Minute Liters of Oxygen  0 L     6 Minute Oxygen Saturation %  93 %     6 Minute Liters of Oxygen  0 L     2 Minute Post Oxygen Saturation %  94 %     2 Minute Post Liters of Oxygen  0 L       Oxygen Initial Assessment: Oxygen Initial Assessment - 04/22/19 1551      Home Oxygen   Home Oxygen Device  None    Sleep Oxygen Prescription  None    Home Exercise Oxygen Prescription  None    Home at Rest Exercise Oxygen Prescription  None    Compliance with Home Oxygen Use  Yes      Intervention   Short Term Goals  To learn and demonstrate proper pursed lip breathing techniques or other breathing techniques.;To learn and demonstrate proper use of respiratory medications    Long  Term Goals  Compliance with respiratory medication;Demonstrates proper use of MDI's;Exhibits proper breathing techniques, such as pursed lip breathing or other method taught during program session       Oxygen Re-Evaluation: Oxygen Re-Evaluation    Row Name 05/05/19 1556             Program Oxygen Prescription   Program Oxygen  Prescription  None         Home Oxygen   Home Oxygen Device  None       Sleep Oxygen Prescription  None       Home Exercise Oxygen Prescription  None       Home at Rest Exercise Oxygen Prescription  None       Compliance with Home Oxygen Use  Yes         Goals/Expected Outcomes   Short  Term Goals  To learn and demonstrate proper pursed lip breathing techniques or other breathing techniques.;To learn and demonstrate proper use of respiratory medications       Long  Term Goals  Compliance with respiratory medication;Demonstrates proper use of MDI's;Exhibits proper breathing techniques, such as pursed lip breathing or other method taught during program session       Comments  Reviewed PLB technique with pt.  Talked about how it works and it's importance in maintaining their exercise saturations.       Goals/Expected Outcomes  Short: Become more profiecient at using PLB.   Long: Become independent at using PLB.          Oxygen Discharge (Final Oxygen Re-Evaluation): Oxygen Re-Evaluation - 05/05/19 1556      Program Oxygen Prescription   Program Oxygen Prescription  None      Home Oxygen   Home Oxygen Device  None    Sleep Oxygen Prescription  None    Home Exercise Oxygen Prescription  None    Home at Rest Exercise Oxygen Prescription  None    Compliance with Home Oxygen Use  Yes      Goals/Expected Outcomes   Short Term Goals  To learn and demonstrate proper pursed lip breathing techniques or other breathing techniques.;To learn and demonstrate proper use of respiratory medications    Long  Term Goals  Compliance with respiratory medication;Demonstrates proper use of MDI's;Exhibits proper breathing techniques, such as pursed lip breathing or other method taught during program session    Comments  Reviewed PLB technique with pt.  Talked about how it works and it's importance in maintaining their exercise saturations.    Goals/Expected Outcomes  Short: Become more profiecient at using  PLB.   Long: Become independent at using PLB.       Initial Exercise Prescription: Initial Exercise Prescription - 04/28/19 1600      Date of Initial Exercise RX and Referring Provider   Date  04/28/19    Referring Provider  Ramaswamy      Treadmill   MPH  2    Grade  3    Minutes  15    METs  3.36      Recumbant Bike   Level  3    RPM  60    Watts  35    Minutes  15    METs  3.3      Arm Ergometer   Level  1    RPM  30    Minutes  15    METs  3.3      REL-XR   Level  4    Speed  50    Minutes  15    METs  3.36      T5 Nustep   Level  2    SPM  80    Minutes  15    METs  3.3      Prescription Details   Frequency (times per week)  3    Duration  Progress to 30 minutes of continuous aerobic without signs/symptoms of physical distress      Intensity   THRR 40-80% of Max Heartrate  102-143    Ratings of Perceived Exertion  11-15    Perceived Dyspnea  0-4      Resistance Training   Training Prescription  Yes    Weight   5lb    Reps  10-15       Perform Capillary Blood Glucose checks  as needed.  Exercise Prescription Changes: Exercise Prescription Changes    Row Name 04/28/19 1600             Response to Exercise   Blood Pressure (Admit)  148/84       Blood Pressure (Exercise)  194/68       Blood Pressure (Exit)  152/74       Heart Rate (Admit)  62 bpm       Heart Rate (Exercise)  90 bpm       Heart Rate (Exit)  78 bpm       Oxygen Saturation (Admit)  98 %       Oxygen Saturation (Exercise)  88 %       Oxygen Saturation (Exit)  94 %       Rating of Perceived Exertion (Exercise)  9       Perceived Dyspnea (Exercise)  0       Symptoms  none          Exercise Comments:   Exercise Goals and Review: Exercise Goals    Row Name 04/28/19 1632             Exercise Goals   Increase Physical Activity  Yes       Intervention  Provide advice, education, support and counseling about physical activity/exercise needs.;Develop an  individualized exercise prescription for aerobic and resistive training based on initial evaluation findings, risk stratification, comorbidities and participant's personal goals.       Expected Outcomes  Short Term: Attend rehab on a regular basis to increase amount of physical activity.;Long Term: Add in home exercise to make exercise part of routine and to increase amount of physical activity.;Long Term: Exercising regularly at least 3-5 days a week.       Increase Strength and Stamina  Yes       Intervention  Provide advice, education, support and counseling about physical activity/exercise needs.;Develop an individualized exercise prescription for aerobic and resistive training based on initial evaluation findings, risk stratification, comorbidities and participant's personal goals.       Expected Outcomes  Short Term: Increase workloads from initial exercise prescription for resistance, speed, and METs.;Short Term: Perform resistance training exercises routinely during rehab and add in resistance training at home;Long Term: Improve cardiorespiratory fitness, muscular endurance and strength as measured by increased METs and functional capacity (6MWT)       Able to understand and use rate of perceived exertion (RPE) scale  Yes       Intervention  Provide education and explanation on how to use RPE scale       Expected Outcomes  Short Term: Able to use RPE daily in rehab to express subjective intensity level;Long Term:  Able to use RPE to guide intensity level when exercising independently       Able to understand and use Dyspnea scale  Yes       Intervention  Provide education and explanation on how to use Dyspnea scale       Expected Outcomes  Short Term: Able to use Dyspnea scale daily in rehab to express subjective sense of shortness of breath during exertion;Long Term: Able to use Dyspnea scale to guide intensity level when exercising independently       Knowledge and understanding of Target Heart  Rate Range (THRR)  Yes       Intervention  Provide education and explanation of THRR including how the numbers were predicted and where they are located for reference  Expected Outcomes  Short Term: Able to state/look up THRR;Short Term: Able to use daily as guideline for intensity in rehab;Long Term: Able to use THRR to govern intensity when exercising independently       Able to check pulse independently  Yes       Intervention  Provide education and demonstration on how to check pulse in carotid and radial arteries.;Review the importance of being able to check your own pulse for safety during independent exercise       Expected Outcomes  Short Term: Able to explain why pulse checking is important during independent exercise;Long Term: Able to check pulse independently and accurately       Understanding of Exercise Prescription  Yes       Intervention  Provide education, explanation, and written materials on patient's individual exercise prescription       Expected Outcomes  Short Term: Able to explain program exercise prescription;Long Term: Able to explain home exercise prescription to exercise independently          Exercise Goals Re-Evaluation : Exercise Goals Re-Evaluation    Row Name 05/05/19 1555             Exercise Goal Re-Evaluation   Exercise Goals Review  Increase Physical Activity;Able to understand and use rate of perceived exertion (RPE) scale;Knowledge and understanding of Target Heart Rate Range (THRR);Understanding of Exercise Prescription;Increase Strength and Stamina;Able to understand and use Dyspnea scale;Able to check pulse independently       Comments  Reviewed RPE scale, THR and program prescription with pt today.  Pt voiced understanding and was given a copy of goals to take home.       Expected Outcomes  Short: Use RPE daily to regulate intensity. Long: Follow program prescription in THR.          Discharge Exercise Prescription (Final Exercise  Prescription Changes): Exercise Prescription Changes - 04/28/19 1600      Response to Exercise   Blood Pressure (Admit)  148/84    Blood Pressure (Exercise)  194/68    Blood Pressure (Exit)  152/74    Heart Rate (Admit)  62 bpm    Heart Rate (Exercise)  90 bpm    Heart Rate (Exit)  78 bpm    Oxygen Saturation (Admit)  98 %    Oxygen Saturation (Exercise)  88 %    Oxygen Saturation (Exit)  94 %    Rating of Perceived Exertion (Exercise)  9    Perceived Dyspnea (Exercise)  0    Symptoms  none       Nutrition:  Target Goals: Understanding of nutrition guidelines, daily intake of sodium '1500mg'$ , cholesterol '200mg'$ , calories 30% from fat and 7% or less from saturated fats, daily to have 5 or more servings of fruits and vegetables.  Biometrics: Pre Biometrics - 04/28/19 1636      Pre Biometrics   Height  6' 0.5" (1.842 m)    Weight  180 lb 14.4 oz (82.1 kg)    BMI (Calculated)  24.18        Nutrition Therapy Plan and Nutrition Goals:   Nutrition Assessments: Nutrition Assessments - 04/28/19 1635      MEDFICTS Scores   Pre Score  44       Nutrition Goals Re-Evaluation:   Nutrition Goals Discharge (Final Nutrition Goals Re-Evaluation):   Psychosocial: Target Goals: Acknowledge presence or absence of significant depression and/or stress, maximize coping skills, provide positive support system. Participant is able to verbalize types  and ability to use techniques and skills needed for reducing stress and depression.   Initial Review & Psychosocial Screening: Initial Psych Review & Screening - 04/22/19 1552      Initial Review   Current issues with  None Identified      Family Dynamics   Good Support System?  Yes   Mom and Aunt .     Barriers   Psychosocial barriers to participate in program  There are no identifiable barriers or psychosocial needs.;The patient should benefit from training in stress management and relaxation.      Screening Interventions    Interventions  Encouraged to exercise;To provide support and resources with identified psychosocial needs;Provide feedback about the scores to participant    Expected Outcomes  Short Term goal: Utilizing psychosocial counselor, staff and physician to assist with identification of specific Stressors or current issues interfering with healing process. Setting desired goal for each stressor or current issue identified.;Long Term Goal: Stressors or current issues are controlled or eliminated.;Short Term goal: Identification and review with participant of any Quality of Life or Depression concerns found by scoring the questionnaire.;Long Term goal: The participant improves quality of Life and PHQ9 Scores as seen by post scores and/or verbalization of changes       Quality of Life Scores:  Scores of 19 and below usually indicate a poorer quality of life in these areas.  A difference of  2-3 points is a clinically meaningful difference.  A difference of 2-3 points in the total score of the Quality of Life Index has been associated with significant improvement in overall quality of life, self-image, physical symptoms, and general health in studies assessing change in quality of life.  PHQ-9: Recent Review Flowsheet Data    Depression screen [facility] 2/9 04/28/2019 04/21/2019 01/21/2019 09/05/2018 08/19/2018   Decreased Interest 0 0 0 0 0   Down, Depressed, Hopeless 0 0 0 0 0   PHQ - 2 Score 0 0 0 0 0   Altered sleeping 1 1 0 0 3   Tired, decreased energy 1 2 0 0 1   Change in appetite 0 0 0 0 1   Feeling bad or failure about yourself  0 0 0 0 0   Trouble concentrating 0 0 0 0 0   Moving slowly or fidgety/restless 0 0 0 0 0   Suicidal thoughts 0 0 0 0 0   PHQ-9 Score 2 3 0 0 5   Difficult doing work/chores Not difficult at all Not difficult at all - Not difficult at all Not difficult at all     Interpretation of Total Score  Total Score Depression Severity:  1-4 = Minimal depression, 5-9 = Mild depression,  10-14 = Moderate depression, 15-19 = Moderately severe depression, 20-27 = Severe depression   Psychosocial Evaluation and Intervention: Psychosocial Evaluation - 04/22/19 1607      Psychosocial Evaluation & Interventions   Interventions  Encouraged to exercise with the program and follow exercise prescription    Comments  Nicole Kindred has no barriers to attending the Pulmonary Rehab program. He is in oprocess of being on Kidney Transplant list and understands this program can help him be more physically prepared when he is called for a transplant. He lives with a roommate and has a good support system with his Mom and Elenor Legato. He is learning a home dialysis process so he can do his own dialysuis and save time not being required to go to a dialysis facility. He should  do well with the program.    Expected Outcomes  STG: Nicole Kindred is able to schedule and attend at least 2 sessions a week, hoping to be 3 sessions a week.  LTG: Nicole Kindred has the tools and resources when he graduates to continue to fight his lung disease and complete his ADLs without tooo much effort.  He is able to walk further than his driveway before getting SOB.    Continue Psychosocial Services   Follow up required by staff       Psychosocial Re-Evaluation:   Psychosocial Discharge (Final Psychosocial Re-Evaluation):   Education: Education Goals: Education classes will be provided on a weekly basis, covering required topics. Participant will state understanding/return demonstration of topics presented.  Learning Barriers/Preferences: Learning Barriers/Preferences - 04/22/19 1554      Learning Barriers/Preferences   Learning Barriers  None    Learning Preferences  None       Education Topics:  Initial Evaluation Education: - Verbal, written and demonstration of respiratory meds, oximetry and breathing techniques. Instruction on use of nebulizers and MDIs and importance of monitoring MDI activations.   General Nutrition  Guidelines/Fats and Fiber: -Group instruction provided by verbal, written material, models and posters to present the general guidelines for heart healthy nutrition. Gives an explanation and review of dietary fats and fiber.   Controlling Sodium/Reading Food Labels: -Group verbal and written material supporting the discussion of sodium use in heart healthy nutrition. Review and explanation with models, verbal and written materials for utilization of the food label.   Exercise Physiology & General Exercise Guidelines: - Group verbal and written instruction with models to review the exercise physiology of the cardiovascular system and associated critical values. Provides general exercise guidelines with specific guidelines to those with heart or lung disease.    Aerobic Exercise & Resistance Training: - Gives group verbal and written instruction on the various components of exercise. Focuses on aerobic and resistive training programs and the benefits of this training and how to safely progress through these programs.   Flexibility, Balance, Mind/Body Relaxation: Provides group verbal/written instruction on the benefits of flexibility and balance training, including mind/body exercise modes such as yoga, pilates and tai chi.  Demonstration and skill practice provided.   Stress and Anxiety: - Provides group verbal and written instruction about the health risks of elevated stress and causes of high stress.  Discuss the correlation between heart/lung disease and anxiety and treatment options. Review healthy ways to manage with stress and anxiety.   Depression: - Provides group verbal and written instruction on the correlation between heart/lung disease and depressed mood, treatment options, and the stigmas associated with seeking treatment.   Exercise & Equipment Safety: - Individual verbal instruction and demonstration of equipment use and safety with use of the equipment.   Pulmonary Rehab  from 04/28/2019 in [facility] Cardiac and Pulmonary Rehab  Date  04/28/19  Educator  AS  Instruction Review Code  1- Verbalizes Understanding      Infection Prevention: - Provides verbal and written material to individual with discussion of infection control including proper hand washing and proper equipment cleaning during exercise session.   Pulmonary Rehab from 04/28/2019 in [facility] Cardiac and Pulmonary Rehab  Date  04/28/19  Educator  AS  Instruction Review Code  1- Verbalizes Understanding      Falls Prevention: - Provides verbal and written material to individual with discussion of falls prevention and safety.   Pulmonary Rehab from 04/28/2019 in [facility] Cardiac and Pulmonary Rehab  Date  04/28/19  Educator  AS  Instruction Review Code  1- Verbalizes Understanding      Diabetes: - Individual verbal and written instruction to review signs/symptoms of diabetes, desired ranges of glucose level fasting, after meals and with exercise. Advice that pre and post exercise glucose checks will be done for 3 sessions at entry of program.   Chronic Lung Diseases: - Group verbal and written instruction to review updates, respiratory medications, advancements in procedures and treatments. Discuss use of supplemental oxygen including available portable oxygen systems, continuous and intermittent flow rates, concentrators, personal use and safety guidelines. Review proper use of inhaler and spacers. Provide informative websites for self-education.    Energy Conservation: - Provide group verbal and written instruction for methods to conserve energy, plan and organize activities. Instruct on pacing techniques, use of adaptive equipment and posture/positioning to relieve shortness of breath.   Triggers and Exacerbations: - Group verbal and written instruction to review types of environmental triggers and ways to prevent exacerbations. Discuss weather changes, air quality and the benefits of nasal washing.  Review warning signs and symptoms to help prevent infections. Discuss techniques for effective airway clearance, coughing, and vibrations.   AED/CPR: - Group verbal and written instruction with the use of models to demonstrate the basic use of the AED with the basic ABC's of resuscitation.   Anatomy and Physiology of the Lungs: - Group verbal and written instruction with the use of models to provide basic lung anatomy and physiology related to function, structure and complications of lung disease.   Anatomy & Physiology of the Heart: - Group verbal and written instruction and models provide basic cardiac anatomy and physiology, with the coronary electrical and arterial systems. Review of Valvular disease and Heart Failure   Cardiac Medications: - Group verbal and written instruction to review commonly prescribed medications for heart disease. Reviews the medication, class of the drug, and side effects.   Know Your Numbers and Risk Factors: -Group verbal and written instruction about important numbers in your health.  Discussion of what are risk factors and how they play a role in the disease process.  Review of Cholesterol, Blood Pressure, Diabetes, and BMI and the role they play in your overall health.   Sleep Hygiene: -Provides group verbal and written instruction about how sleep can affect your health.  Define sleep hygiene, discuss sleep cycles and impact of sleep habits. Review good sleep hygiene tips.    Other: -Provides group and verbal instruction on various topics (see comments)    Knowledge Questionnaire Score: Knowledge Questionnaire Score - 04/28/19 1633      Knowledge Questionnaire Score   Pre Score  15/18        Core Components/Risk Factors/Patient Goals at Admission: Personal Goals and Risk Factors at Admission - 04/28/19 1640      Core Components/Risk Factors/Patient Goals on Admission    Weight Management  Weight Gain;Yes    Intervention  Weight  Management: Develop a combined nutrition and exercise program designed to reach desired caloric intake, while maintaining appropriate intake of nutrient and fiber, sodium and fats, and appropriate energy expenditure required for the weight goal.;Weight Management: Provide education and appropriate resources to help participant work on and attain dietary goals.    Admit Weight  180 lb 14.4 oz (82.1 kg)    Goal Weight: Short Term  190 lb (86.2 kg)    Goal Weight: Long Term  210 lb (95.3 kg)    Expected Outcomes  Short Term:  Continue to assess and modify interventions until short term weight is achieved;Long Term: Adherence to nutrition and physical activity/exercise program aimed toward attainment of established weight goal;Weight Gain: Understanding of general recommendations for a high calorie, high protein meal plan that promotes weight gain by distributing calorie intake throughout the day with the consumption for 4-5 meals, snacks, and/or supplements    Heart Failure  Yes    Intervention  Provide a combined exercise and nutrition program that is supplemented with education, support and counseling about heart failure. Directed toward relieving symptoms such as shortness of breath, decreased exercise tolerance, and extremity edema.    Expected Outcomes  Improve functional capacity of life;Short term: Attendance in program 2-3 days a week with increased exercise capacity. Reported lower sodium intake. Reported increased fruit and vegetable intake. Reports medication compliance.;Short term: Daily weights obtained and reported for increase. Utilizing diuretic protocols set by physician.;Long term: Adoption of self-care skills and reduction of barriers for early signs and symptoms recognition and intervention leading to self-care maintenance.       Core Components/Risk Factors/Patient Goals Review:    Core Components/Risk Factors/Patient Goals at Discharge (Final Review):    ITP Comments: ITP Comments     Row Name 04/22/19 1605 05/05/19 1554 05/07/19 1020       ITP Comments  Virtual Orientation completed today. Appointment for EP eval and gym orientation set for 3/15 at 3 PM. Documentation of diagnosis can be found in [facility] 03/21/2019.  First full day of exercise!  Patient was oriented to gym and equipment including functions, settings, policies, and procedures.  Patient's individual exercise prescription and treatment plan were reviewed.  All starting workloads were established based on the results of the 6 minute walk test done at initial orientation visit.  The plan for exercise progression was also introduced and progression will be customized based on patient's performance and goals.  30 day chart review completed. ITP sent to Dr Zachery Dakins Medical Director, for review,changes as needed and signature. New to program        Comments:

## 2019-05-07 NOTE — Telephone Encounter (Signed)
Erroneous Entry  

## 2019-05-08 DIAGNOSIS — N186 End stage renal disease: Secondary | ICD-10-CM | POA: Diagnosis not present

## 2019-05-08 DIAGNOSIS — Z992 Dependence on renal dialysis: Secondary | ICD-10-CM | POA: Diagnosis not present

## 2019-05-08 MED ORDER — EPLERENONE 25 MG TABLET
ORAL_TABLET | Freq: Every day | ORAL | 11 refills | 30 days | Status: CP
Start: 2019-05-08 — End: 2020-05-07

## 2019-05-09 ENCOUNTER — Encounter: Admit: 2019-05-09 | Discharge: 2019-05-10 | Payer: MEDICARE

## 2019-05-09 ENCOUNTER — Ambulatory Visit: Payer: PRIVATE HEALTH INSURANCE

## 2019-05-09 ENCOUNTER — Telehealth: Payer: Self-pay | Admitting: Internal Medicine

## 2019-05-09 DIAGNOSIS — R0600 Dyspnea, unspecified: Secondary | ICD-10-CM | POA: Diagnosis not present

## 2019-05-09 DIAGNOSIS — Z992 Dependence on renal dialysis: Secondary | ICD-10-CM | POA: Diagnosis not present

## 2019-05-09 DIAGNOSIS — I129 Hypertensive chronic kidney disease with stage 1 through stage 4 chronic kidney disease, or unspecified chronic kidney disease: Secondary | ICD-10-CM | POA: Diagnosis not present

## 2019-05-09 DIAGNOSIS — J449 Chronic obstructive pulmonary disease, unspecified: Secondary | ICD-10-CM | POA: Diagnosis not present

## 2019-05-09 DIAGNOSIS — I272 Pulmonary hypertension, unspecified: Secondary | ICD-10-CM | POA: Diagnosis not present

## 2019-05-09 DIAGNOSIS — I10 Essential (primary) hypertension: Secondary | ICD-10-CM | POA: Diagnosis not present

## 2019-05-09 DIAGNOSIS — N186 End stage renal disease: Secondary | ICD-10-CM | POA: Diagnosis not present

## 2019-05-09 DIAGNOSIS — G4734 Idiopathic sleep related nonobstructive alveolar hypoventilation: Principal | ICD-10-CM

## 2019-05-09 NOTE — Telephone Encounter (Signed)
Pt scheduled for PFT at [facility] 3/30.  Once pt does have the PFT, results can be faxed to [facility] in attn Dr. Marijean Bravo.  Will leave encounter open to f/u on.

## 2019-05-12 ENCOUNTER — Other Ambulatory Visit: Payer: Self-pay

## 2019-05-12 ENCOUNTER — Other Ambulatory Visit: Admission: RE | Admit: 2019-05-12 | Payer: PRIVATE HEALTH INSURANCE | Source: Ambulatory Visit

## 2019-05-12 ENCOUNTER — Encounter: Payer: PRIVATE HEALTH INSURANCE | Admitting: *Deleted

## 2019-05-12 ENCOUNTER — Telehealth: Payer: Self-pay | Admitting: Internal Medicine

## 2019-05-12 DIAGNOSIS — J849 Interstitial pulmonary disease, unspecified: Secondary | ICD-10-CM

## 2019-05-12 NOTE — Telephone Encounter (Signed)
Patient is seeing Dr. Marijean Bravo, Dr. Marijean Bravo is okay with PFT from Jan 2021. Patient did not go to get his Covid test and therefore PFT for 05/13/19 was cancelled, we can get patient scheduled for PFT again if needed.  Dr. Chase Caller please advise if you still need this PFT since patient has one in January from [place]

## 2019-05-12 NOTE — Progress Notes (Signed)
Daily Session Note  Patient Details  Name: Louis [name] MRN: [id number] Date of Birth: [birth year] Referring Provider:     Pulmonary Rehab from 04/28/2019 in [facility] Card[facility]  Referring Provider  Ramaswamy      Encounter Date: 05/12/2019  Check In: Session Check In - 05/12/19 New Post      Check-In   Supervising physician immediately available to respond to emergencies  See telemetry face sheet for immediately available ER MD    Location  [facility]-Cardiac & Pulmonary Rehab    Staff Present  Renita Papa, RN Moises Blood, BS, ACSM CEP, Exercise Physiologist;Joseph Yates [place], [place], RCEP, CCRP, CCET    Virtual Visit  No    Medication changes reported      No    Fall or balance concerns reported     No    Warm-up and Cool-down  Performed on first and last piece of equipment    Resistance Training Performed  Yes    VAD Patient?  No    PAD/SET Patient?  No      Pain Assessment   Currently in Pain?  No/denies          Social History   Tobacco Use  Smoking Status Never Smoker  Smokeless Tobacco Never Used    Goals Met:  Independence with exercise equipment Exercise tolerated well No report of cardiac concerns or symptoms Strength training completed today  Goals Unmet:  Not Applicable  Comments: Pt able to follow exercise prescription today without complaint.  Will continue to monitor for progression.    Dr. Emily Filbert is Medical Director for [facility].

## 2019-05-13 ENCOUNTER — Ambulatory Visit: Payer: PRIVATE HEALTH INSURANCE

## 2019-05-13 NOTE — Telephone Encounter (Signed)
My concern was progression and therefore want to do spirometry and DLCO.  If there are logistical or technical challenges getting it then let us just go with the CT scan as outlined in the progress note from March 2021 and I will manage with that.  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

## 2019-05-14 ENCOUNTER — Encounter: Admit: 2019-05-14 | Discharge: 2019-05-15 | Payer: MEDICARE

## 2019-05-14 ENCOUNTER — Other Ambulatory Visit: Payer: Self-pay | Admitting: Internal Medicine

## 2019-05-14 DIAGNOSIS — Z992 Dependence on renal dialysis: Secondary | ICD-10-CM | POA: Diagnosis not present

## 2019-05-14 DIAGNOSIS — N186 End stage renal disease: Secondary | ICD-10-CM | POA: Diagnosis not present

## 2019-05-14 NOTE — Telephone Encounter (Signed)
Tammy called back from [facility].She states she will contact pt and get him rescheduled for next week for the PFT (spiro and DLCO). She will contact us back if the pt is willing to rescheduled to get the COVID test ordered.

## 2019-05-14 NOTE — Telephone Encounter (Signed)
LMTCB for Louis [name] 

## 2019-05-14 NOTE — Telephone Encounter (Signed)
Pt did not have PFT done as he did not get his COVID test done.

## 2019-05-15 ENCOUNTER — Ambulatory Visit: Admission: RE | Admit: 2019-05-15 | Payer: PRIVATE HEALTH INSURANCE | Source: Ambulatory Visit

## 2019-05-16 DIAGNOSIS — Z992 Dependence on renal dialysis: Secondary | ICD-10-CM | POA: Diagnosis not present

## 2019-05-16 DIAGNOSIS — N186 End stage renal disease: Secondary | ICD-10-CM | POA: Diagnosis not present

## 2019-05-19 ENCOUNTER — Other Ambulatory Visit: Payer: Self-pay

## 2019-05-19 ENCOUNTER — Other Ambulatory Visit
Admission: RE | Admit: 2019-05-19 | Discharge: 2019-05-19 | Disposition: A | Discharge status: Home / Self Care | Payer: PRIVATE HEALTH INSURANCE | Source: Ambulatory Visit | Attending: Internal Medicine | Admitting: Internal Medicine

## 2019-05-19 ENCOUNTER — Telehealth: Payer: Self-pay | Admitting: Family Medicine

## 2019-05-19 DIAGNOSIS — Z01812 Encounter for preprocedural laboratory examination: Secondary | ICD-10-CM | POA: Insufficient documentation

## 2019-05-19 DIAGNOSIS — Z20822 Contact with and (suspected) exposure to covid-19: Secondary | ICD-10-CM | POA: Diagnosis not present

## 2019-05-19 LAB — SARS CORONAVIRUS 2 (TAT 6-24 HRS): SARS Coronavirus 2: NEGATIVE

## 2019-05-19 NOTE — Telephone Encounter (Signed)
Received fax from [employer] on 05/14/19, stating the patient's stool sample for the Cologuard test exceed the allowable weight (300 grams).  [employer] will contact patient to initiate a new sample collection.

## 2019-05-20 ENCOUNTER — Telehealth: Payer: Self-pay | Admitting: Internal Medicine

## 2019-05-20 ENCOUNTER — Ambulatory Visit: Payer: PRIVATE HEALTH INSURANCE

## 2019-05-20 NOTE — Telephone Encounter (Signed)
I looked in MR's inbox in [facility] and found patient's ONO from [place]. Per the ONO, the patient does qualify for O2. He spent 86.74mins at or below 88%. He spent 185.66mins at or below 89%. The lowest his O2 dropped to was 61%.   Per Liborio Nixon, MR will be back in the office on April 12th.   TP, would you be willing to look at the ONO for MR? Or do you feel this can wait until he returns to the office next Monday. I have the ONO in triage. Thanks!

## 2019-05-20 NOTE — Telephone Encounter (Signed)
Yes of course Please bring over ONO so  I can take a look.  He can begin oxygen 2 L at bedtime

## 2019-05-20 NOTE — Telephone Encounter (Signed)
Spoke with TP while she reviewed the results. She stated that she was ok with ordering 2L of O2 at night.   Spoke with patient. He verbalized understanding of results. He stated that he is already supposed to be using O2 during the day but he can't due to back and lung pain. He stated that his lungs and back will start after using the O2. He described the pain as a dull ache. He is supposed to be using 2L during the day but is not.   TP, please advise. Thanks!

## 2019-05-20 NOTE — Telephone Encounter (Signed)
Spoke with patient. He has an appt with MR later on this month after his PFT. Will place results back in MR's inbox for him to review.   Nothing further needed at time of call.

## 2019-05-20 NOTE — Telephone Encounter (Signed)
Caryl Pina from [place] is calling back. Can be reached at [phone]

## 2019-05-20 NOTE — Telephone Encounter (Signed)
I have faxed the ONO to the number given.

## 2019-05-20 NOTE — Telephone Encounter (Signed)
Please make ov with Dr. Chase Caller to discuss these issues with oxygen

## 2019-05-20 NOTE — Telephone Encounter (Signed)
Will need to get APP of the day to look at this, as MR out of the office  [facility]

## 2019-05-24 DIAGNOSIS — J449 Chronic obstructive pulmonary disease, unspecified: Secondary | ICD-10-CM | POA: Diagnosis not present

## 2019-05-27 ENCOUNTER — Telehealth: Payer: Self-pay

## 2019-05-27 NOTE — Telephone Encounter (Signed)
Called to check in on pt as pt has not been to rehab since the end of march. Pt reports tooth has been bothering him and he is getting it pulled the 19th, will f/u with pt for return to rehab.

## 2019-05-28 ENCOUNTER — Telehealth: Payer: Self-pay | Admitting: Internal Medicine

## 2019-05-28 ENCOUNTER — Encounter: Payer: Self-pay | Admitting: *Deleted

## 2019-05-28 DIAGNOSIS — J849 Interstitial pulmonary disease, unspecified: Secondary | ICD-10-CM

## 2019-05-28 DIAGNOSIS — G4734 Idiopathic sleep related nonobstructive alveolar hypoventilation: Secondary | ICD-10-CM

## 2019-05-28 NOTE — Telephone Encounter (Signed)
Order placed. Louis [name] was made aware.    Louis [name], [place]     05/20/19 2:28 PM Note   Spoke with TP while she reviewed the results. She stated that she was ok with ordering 2L of O2 at night.   Spoke with patient. He verbalized understanding of results. He stated that he is already supposed to be using O2 during the day but he can't due to back and lung pain. He stated that his lungs and back will start after using the O2. He described the pain as a dull ache. He is supposed to be using 2L during the day but is not.   TP, please advise. Thanks!

## 2019-05-30 ENCOUNTER — Ambulatory Visit: Payer: PRIVATE HEALTH INSURANCE | Admitting: Internal Medicine

## 2019-05-30 ENCOUNTER — Telehealth: Payer: Self-pay | Admitting: Internal Medicine

## 2019-06-02 ENCOUNTER — Ambulatory Visit
Admit: 2019-06-02 | Discharge: 2019-06-03 | Payer: MEDICARE | Attending: Student in an Organized Health Care Education/Training Program | Primary: Student in an Organized Health Care Education/Training Program

## 2019-06-02 ENCOUNTER — Other Ambulatory Visit: Payer: Self-pay

## 2019-06-02 ENCOUNTER — Other Ambulatory Visit
Admission: RE | Admit: 2019-06-02 | Discharge: 2019-06-02 | Disposition: A | Discharge status: Home / Self Care | Payer: PRIVATE HEALTH INSURANCE | Source: Ambulatory Visit | Attending: Internal Medicine | Admitting: Internal Medicine

## 2019-06-02 DIAGNOSIS — Z01812 Encounter for preprocedural laboratory examination: Secondary | ICD-10-CM | POA: Diagnosis present

## 2019-06-02 DIAGNOSIS — Z20822 Contact with and (suspected) exposure to covid-19: Secondary | ICD-10-CM | POA: Insufficient documentation

## 2019-06-02 DIAGNOSIS — L409 Psoriasis, unspecified: Principal | ICD-10-CM

## 2019-06-02 LAB — SARS CORONAVIRUS 2 (TAT 6-24 HRS): SARS Coronavirus 2: NEGATIVE

## 2019-06-02 NOTE — Telephone Encounter (Signed)
Pt's results had already been sent to scan.  Attempted to call Phylis Bougie with [facility] as it seemed like we faxed pt's ONO results to her on 4/6. Left her a message for her to return call so we can follow up on this. In the meantime while waiting for Judson Roch to return call, I have called [employer] and spoke with Bethanne Ginger to have pt's results refaxed to [place] so we could refax them to Phylis Bougie at [facility].  Will await pt's results to be faxed over.

## 2019-06-02 NOTE — Telephone Encounter (Signed)
Raquel Sarna do you still have this ONO result? It was not yet scanned it yet. If not I will call and get this refaxed, thanks

## 2019-06-02 NOTE — Telephone Encounter (Signed)
Judson Roch called back concerning fax, informed her that a new fax will be sent once we received them from [place]. She said to ok and to have them faxed over to [phone]

## 2019-06-03 ENCOUNTER — Other Ambulatory Visit: Payer: Self-pay

## 2019-06-03 ENCOUNTER — Ambulatory Visit: Payer: PRIVATE HEALTH INSURANCE | Attending: Internal Medicine

## 2019-06-03 DIAGNOSIS — J849 Interstitial pulmonary disease, unspecified: Secondary | ICD-10-CM | POA: Insufficient documentation

## 2019-06-03 NOTE — Telephone Encounter (Signed)
LMOVM to confirm how patient is taking IMDUR 60MG  or if patient has started taking it Rx Refill requesting IMDUR 60MG   IMDUR 60MG  was started 03/03/19 and was discontinued 03/04/19 Last OV Note states to start IMDUR 60MG  daily.

## 2019-06-03 NOTE — Telephone Encounter (Signed)
Pt called b/c his insurance stated that he will returning back to work Tuesday based off of information from [place]. Please advise. Pt can be reached at [phone]

## 2019-06-03 NOTE — Telephone Encounter (Signed)
-----   Message from Portal.Daleen Bo, RN sent at 06/03/2019  9:23 AM EDT ----- Yes, please refill imdur.  Iva  ----- Message ----- From: Casimer Lanius, CMA Sent: 06/03/2019   8:55 AM EDT To: [facility] Triage  Refill requested for IMDUR 60MG   Last OV Note (03/03/19) states to start IMDUR 60MG . IMDUR 60MG  was discontinued (03/03/19) Please advise if patient needs to be taking IMDUR 60MG  Thank you

## 2019-06-03 NOTE — Telephone Encounter (Signed)
Left message for patient to call back  

## 2019-06-04 ENCOUNTER — Encounter: Payer: Self-pay | Admitting: *Deleted

## 2019-06-04 DIAGNOSIS — J849 Interstitial pulmonary disease, unspecified: Secondary | ICD-10-CM

## 2019-06-04 MED ORDER — CALCITRIOL 0.25 MCG CAPSULE
ORAL_CAPSULE | Freq: Every day | ORAL | 11 refills | 30.00000 days | Status: CP
Start: 2019-06-04 — End: 2020-06-03

## 2019-06-04 NOTE — Telephone Encounter (Signed)
lmtcb

## 2019-06-04 NOTE — Progress Notes (Signed)
Pulmonary Individual Treatment Plan  Patient Details  Name: Louis [name] MRN: [id number] Date of Birth: 09-20-1960 Referring Provider:     Pulmonary Rehab from 04/28/2019 in [facility] Cardiac and Pulmonary Rehab  Referring Provider  Ramaswamy      Initial Encounter Date:    Pulmonary Rehab from 04/28/2019 in [facility] Cardiac and Pulmonary Rehab  Date  04/28/19      Visit Diagnosis: ILD (interstitial lung disease) ([place])  Patient's Home Medications on Admission:  Current Outpatient Medications:  .  amLODipine (NORVASC) 10 MG tablet, Take 1 tablet (10 mg total) by mouth daily., Disp: 30 tablet, Rfl: 0 .  AURYXIA 1 GM 210 MG(Fe) tablet, Take 1 tablet by mouth 4 (four) times daily -  before meals and at bedtime., Disp: , Rfl:  .  B Complex-C-Folic Acid (RENA-VITE RX) 1 MG TABS, Take 1 tablet by mouth daily., Disp: , Rfl:  .  budesonide-formoterol (SYMBICORT) 80-4.5 MCG/ACT inhaler, Inhale 2 puffs into the lungs 2 (two) times daily., Disp: 1 Inhaler, Rfl: 12 .  carvedilol (COREG) 25 MG tablet, Take 1 tablet (25 mg total) by mouth 2 (two) times daily., Disp: 180 tablet, Rfl: 3 .  cetirizine (ZYRTEC) 10 MG tablet, Take 1 tablet (10 mg total) by mouth daily., Disp: 30 tablet, Rfl: 11 .  cloNIDine (CATAPRES - DOSED IN MG/24 HR) 0.2 mg/24hr patch, Place 1 patch (0.2 mg total) onto the skin once a week., Disp: 4 patch, Rfl: 12 .  DESCOVY 200-25 MG tablet, Take 1 tablet by mouth daily., Disp: , Rfl:  .  diclofenac sodium (VOLTAREN) 1 % GEL, Apply 2 g topically 4 (four) times daily. (Patient not taking: Reported on 04/22/2019), Disp: 100 g, Rfl: 0 .  Febuxostat 80 MG TABS, Take 1 tablet by mouth daily., Disp: , Rfl:  .  ferrous sulfate 325 (65 FE) MG tablet, Take 1 tablet by mouth daily., Disp: , Rfl:  .  fluticasone (FLONASE) 50 MCG/ACT nasal spray, Place into the nose., Disp: , Rfl:  .  furosemide (LASIX) 40 MG tablet, Take 80 mg by mouth daily. , Disp: , Rfl:  .  halobetasol (ULTRAVATE) 0.05 % cream,  APPLY TO AFFECTED AREA TWICE A DAY, Disp: , Rfl:  .  hydrALAZINE (APRESOLINE) 100 MG tablet, Take 150 mg by mouth 3 (three) times daily., Disp: , Rfl:  .  methocarbamol (ROBAXIN) 500 MG tablet, Take 500 mg by mouth 2 (two) times daily as needed. for pain, Disp: , Rfl:  .  predniSONE (DELTASONE) 10 MG tablet, Take 1 tablet (10 mg total) by mouth 2 (two) times daily as needed. Prn gout attack for 2 days, Disp: 10 tablet, Rfl: 0 .  SPIRIVA RESPIMAT 1.25 MCG/ACT AERS, INHALE 2 PUFFS BY MOUTH INTO THE LUNGS DAILY, Disp: 4 g, Rfl: 3 .  TIVICAY 50 MG tablet, Take 1 tablet by mouth daily., Disp: , Rfl:  .  valACYclovir (VALTREX) 500 MG tablet, Take 500 mg by mouth daily as needed (for flare ups). , Disp: , Rfl:   Past Medical History: Past Medical History:  Diagnosis Date  . Chronic kidney disease    esrd  . Dermatophytosis of foot 06/23/2004  . Environmental allergies   . Gout   . HIV infection ([place]) 12/1995  . Hypertension   . Seborrhea 06/23/2004    Tobacco Use: Social History   Tobacco Use  Smoking Status Never Smoker  Smokeless Tobacco Never Used    Labs: Recent Review [employer]  for [facility] Cardiac and Pulmonary Rehab Latest Ref Rng & Units 06/09/2010 08/09/2011 08/23/2016   Cholestrol 0 - 200 mg/dL 186 179 -   LDLCALC 0 - 99 mg/dL 117(H) 107(H) -   HDL >39 mg/dL 55 54 -   Trlycerides <150 mg/dL 69 89 -   Hemoglobin A1c 4.8 - 5.6 % - - 5.1       Pulmonary Assessment Scores: Pulmonary Assessment Scores    Row Name 04/28/19 1634         ADL UCSD   SOB Score total  25     Rest  0     Walk  0     Stairs  2     Bath  0     Dress  0     Shop  2       CAT Score   CAT Score  16       mMRC Score   mMRC Score  0        UCSD: Self-administered rating of dyspnea associated with activities of daily living (ADLs) 6-point scale (0 = "not at all" to 5 = "maximal or unable to do because of breathlessness")  Scoring Scores range from 0 to 120.  Minimally important  difference is 5 units  CAT: CAT can identify the health impairment of COPD patients and is better correlated with disease progression.  CAT has a scoring range of zero to 40. The CAT score is classified into four groups of low (less than 10), medium (10 - 20), high (21-30) and very high (31-40) based on the impact level of disease on health status. A CAT score over 10 suggests significant symptoms.  A worsening CAT score could be explained by an exacerbation, poor medication adherence, poor inhaler technique, or progression of COPD or comorbid conditions.  CAT MCID is 2 points  mMRC: mMRC (Modified Medical Research Council) Dyspnea Scale is used to assess the degree of baseline functional disability in patients of respiratory disease due to dyspnea. No minimal important difference is established. A decrease in score of 1 point or greater is considered a positive change.   Pulmonary Function Assessment:   Exercise Target Goals: Exercise Program Goal: Individual exercise prescription set using results from initial 6 min walk test and THRR while considering  patient's activity barriers and safety.   Exercise Prescription Goal: Initial exercise prescription builds to 30-45 minutes a day of aerobic activity, 2-3 days per week.  Home exercise guidelines will be given to patient during program as part of exercise prescription that the participant will acknowledge.  Education: Aerobic Exercise & Resistance Training: - Gives group verbal and written instruction on the various components of exercise. Focuses on aerobic and resistive training programs and the benefits of this training and how to safely progress through these programs..   Education: Exercise & Equipment Safety: - Individual verbal instruction and demonstration of equipment use and safety with use of the equipment.   Pulmonary Rehab from 04/28/2019 in [facility] Cardiac and Pulmonary Rehab  Date  04/28/19  Educator  AS  Instruction Review  Code  1- Verbalizes Understanding      Education: Exercise Physiology & General Exercise Guidelines: - Group verbal and written instruction with models to review the exercise physiology of the cardiovascular system and associated critical values. Provides general exercise guidelines with specific guidelines to those with heart or lung disease.    Education: Flexibility, Balance, Mind/Body Relaxation: Provides group verbal/written instruction on the benefits of flexibility  and balance training, including mind/body exercise modes such as yoga, pilates and tai chi.  Demonstration and skill practice provided.   Activity Barriers & Risk Stratification: Activity Barriers & Cardiac Risk Stratification - 04/22/19 1548      Activity Barriers & Cardiac Risk Stratification   Activity Barriers  Shortness of Breath   USed to be able to run a 4 min mile- 15 years ago.  Now hard to walk down driveway      6 Minute Walk: 6 Minute Walk    Row Name 04/28/19 1624         6 Minute Walk   Phase  Initial     Distance  1100 feet     Walk Time  6 minutes     # of Rest Breaks  0     MPH  2.08     METS  3.9     RPE  9     Perceived Dyspnea   0     VO2 Peak  13.64     Symptoms  No     Resting HR  62 bpm     Resting BP  148/84     Resting Oxygen Saturation   98 %     Exercise Oxygen Saturation  during 6 min walk  88 %     Max Ex. HR  90 bpm     Max Ex. BP  194/68     2 Minute Post BP  152/74       Interval HR   1 Minute HR  76     2 Minute HR  84     3 Minute HR  87     4 Minute HR  88     5 Minute HR  88     6 Minute HR  90     2 Minute Post HR  78     Interval Heart Rate?  Yes       Interval Oxygen   Interval Oxygen?  Yes     Baseline Oxygen Saturation %  98 %     1 Minute Oxygen Saturation %  96 %     1 Minute Liters of Oxygen  0 L     2 Minute Oxygen Saturation %  92 %     2 Minute Liters of Oxygen  0 L     3 Minute Oxygen Saturation %  91 %     3 Minute Liters of Oxygen  0 L      4 Minute Oxygen Saturation %  93 %     4 Minute Liters of Oxygen  0 L     5 Minute Oxygen Saturation %  88 %     5 Minute Liters of Oxygen  0 L     6 Minute Oxygen Saturation %  93 %     6 Minute Liters of Oxygen  0 L     2 Minute Post Oxygen Saturation %  94 %     2 Minute Post Liters of Oxygen  0 L       Oxygen Initial Assessment: Oxygen Initial Assessment - 04/22/19 1551      Home Oxygen   Home Oxygen Device  None    Sleep Oxygen Prescription  None    Home Exercise Oxygen Prescription  None    Home at Rest Exercise Oxygen Prescription  None    Compliance with Home Oxygen Use  Yes  Intervention   Short Term Goals  To learn and demonstrate proper pursed lip breathing techniques or other breathing techniques.;To learn and demonstrate proper use of respiratory medications    Long  Term Goals  Compliance with respiratory medication;Demonstrates proper use of MDI's;Exhibits proper breathing techniques, such as pursed lip breathing or other method taught during program session       Oxygen Re-Evaluation: Oxygen Re-Evaluation    Row Name 05/05/19 1556             Program Oxygen Prescription   Program Oxygen Prescription  None         Home Oxygen   Home Oxygen Device  None       Sleep Oxygen Prescription  None       Home Exercise Oxygen Prescription  None       Home at Rest Exercise Oxygen Prescription  None       Compliance with Home Oxygen Use  Yes         Goals/Expected Outcomes   Short Term Goals  To learn and demonstrate proper pursed lip breathing techniques or other breathing techniques.;To learn and demonstrate proper use of respiratory medications       Long  Term Goals  Compliance with respiratory medication;Demonstrates proper use of MDI's;Exhibits proper breathing techniques, such as pursed lip breathing or other method taught during program session       Comments  Reviewed PLB technique with pt.  Talked about how it works and it's importance in  maintaining their exercise saturations.       Goals/Expected Outcomes  Short: Become more profiecient at using PLB.   Long: Become independent at using PLB.          Oxygen Discharge (Final Oxygen Re-Evaluation): Oxygen Re-Evaluation - 05/05/19 1556      Program Oxygen Prescription   Program Oxygen Prescription  None      Home Oxygen   Home Oxygen Device  None    Sleep Oxygen Prescription  None    Home Exercise Oxygen Prescription  None    Home at Rest Exercise Oxygen Prescription  None    Compliance with Home Oxygen Use  Yes      Goals/Expected Outcomes   Short Term Goals  To learn and demonstrate proper pursed lip breathing techniques or other breathing techniques.;To learn and demonstrate proper use of respiratory medications    Long  Term Goals  Compliance with respiratory medication;Demonstrates proper use of MDI's;Exhibits proper breathing techniques, such as pursed lip breathing or other method taught during program session    Comments  Reviewed PLB technique with pt.  Talked about how it works and it's importance in maintaining their exercise saturations.    Goals/Expected Outcomes  Short: Become more profiecient at using PLB.   Long: Become independent at using PLB.       Initial Exercise Prescription: Initial Exercise Prescription - 04/28/19 1600      Date of Initial Exercise RX and Referring Provider   Date  04/28/19    Referring Provider  Ramaswamy      Treadmill   MPH  2    Grade  3    Minutes  15    METs  3.36      Recumbant Bike   Level  3    RPM  60    Watts  35    Minutes  15    METs  3.3      Arm Ergometer   Level  1    RPM  30    Minutes  15    METs  3.3      REL-XR   Level  4    Speed  50    Minutes  15    METs  3.36      T5 Nustep   Level  2    SPM  80    Minutes  15    METs  3.3      Prescription Details   Frequency (times per week)  3    Duration  Progress to 30 minutes of continuous aerobic without signs/symptoms of physical  distress      Intensity   THRR 40-80% of Max Heartrate  102-143    Ratings of Perceived Exertion  11-15    Perceived Dyspnea  0-4      Resistance Training   Training Prescription  Yes    Weight   5lb    Reps  10-15       Perform Capillary Blood Glucose checks as needed.  Exercise Prescription Changes: Exercise Prescription Changes    Row Name 04/28/19 1600 05/14/19 1300           Response to Exercise   Blood Pressure (Admit)  148/84  126/74      Blood Pressure (Exercise)  194/68  140/72      Blood Pressure (Exit)  152/74  140/82      Heart Rate (Admit)  62 bpm  76 bpm      Heart Rate (Exercise)  90 bpm  88 bpm      Heart Rate (Exit)  78 bpm  78 bpm      Oxygen Saturation (Admit)  98 %  97 %      Oxygen Saturation (Exercise)  88 %  95 %      Oxygen Saturation (Exit)  94 %  98 %      Rating of Perceived Exertion (Exercise)  9  11      Perceived Dyspnea (Exercise)  0  1      Symptoms  none  fatigue      Comments  --  second full day of exercise      Duration  --  Progress to 30 minutes of  aerobic without signs/symptoms of physical distress      Intensity  --  THRR unchanged        Progression   Progression  --  Continue to progress workloads to maintain intensity without signs/symptoms of physical distress.      Average METs  --  2.24        Resistance Training   Training Prescription  --  Yes      Weight  --   5lb      Reps  --  10-15        Interval Training   Interval Training  --  No        Treadmill   MPH  --  1.6      Grade  --  0      Minutes  --  15      METs  --  2.23        Recumbant Bike   Level  --  3      Watts  --  12      Minutes  --  15      METs  --  2.39        Arm  Ergometer   Level  --  1      Minutes  --  15      METs  --  2.1         Exercise Comments:   Exercise Goals and Review: Exercise Goals    Row Name 04/28/19 1632             Exercise Goals   Increase Physical Activity  Yes       Intervention  Provide  advice, education, support and counseling about physical activity/exercise needs.;Develop an individualized exercise prescription for aerobic and resistive training based on initial evaluation findings, risk stratification, comorbidities and participant's personal goals.       Expected Outcomes  Short Term: Attend rehab on a regular basis to increase amount of physical activity.;Long Term: Add in home exercise to make exercise part of routine and to increase amount of physical activity.;Long Term: Exercising regularly at least 3-5 days a week.       Increase Strength and Stamina  Yes       Intervention  Provide advice, education, support and counseling about physical activity/exercise needs.;Develop an individualized exercise prescription for aerobic and resistive training based on initial evaluation findings, risk stratification, comorbidities and participant's personal goals.       Expected Outcomes  Short Term: Increase workloads from initial exercise prescription for resistance, speed, and METs.;Short Term: Perform resistance training exercises routinely during rehab and add in resistance training at home;Long Term: Improve cardiorespiratory fitness, muscular endurance and strength as measured by increased METs and functional capacity (6MWT)       Able to understand and use rate of perceived exertion (RPE) scale  Yes       Intervention  Provide education and explanation on how to use RPE scale       Expected Outcomes  Short Term: Able to use RPE daily in rehab to express subjective intensity level;Long Term:  Able to use RPE to guide intensity level when exercising independently       Able to understand and use Dyspnea scale  Yes       Intervention  Provide education and explanation on how to use Dyspnea scale       Expected Outcomes  Short Term: Able to use Dyspnea scale daily in rehab to express subjective sense of shortness of breath during exertion;Long Term: Able to use Dyspnea scale to guide  intensity level when exercising independently       Knowledge and understanding of Target Heart Rate Range (THRR)  Yes       Intervention  Provide education and explanation of THRR including how the numbers were predicted and where they are located for reference       Expected Outcomes  Short Term: Able to state/look up THRR;Short Term: Able to use daily as guideline for intensity in rehab;Long Term: Able to use THRR to govern intensity when exercising independently       Able to check pulse independently  Yes       Intervention  Provide education and demonstration on how to check pulse in carotid and radial arteries.;Review the importance of being able to check your own pulse for safety during independent exercise       Expected Outcomes  Short Term: Able to explain why pulse checking is important during independent exercise;Long Term: Able to check pulse independently and accurately       Understanding of Exercise Prescription  Yes       Intervention  Provide education, explanation, and written materials on patient's individual exercise prescription       Expected Outcomes  Short Term: Able to explain program exercise prescription;Long Term: Able to explain home exercise prescription to exercise independently          Exercise Goals Re-Evaluation : Exercise Goals Re-Evaluation    Row Name 05/05/19 1555 05/14/19 1325 05/28/19 1548         Exercise Goal Re-Evaluation   Exercise Goals Review  Increase Physical Activity;Able to understand and use rate of perceived exertion (RPE) scale;Knowledge and understanding of Target Heart Rate Range (THRR);Understanding of Exercise Prescription;Increase Strength and Stamina;Able to understand and use Dyspnea scale;Able to check pulse independently  Increase Physical Activity;Increase Strength and Stamina;Understanding of Exercise Prescription  --     Comments  Reviewed RPE scale, THR and program prescription with pt today.  Pt voiced understanding and was  given a copy of goals to take home.  Louis [name] is off to a good start in rehab.  He has completed his first two full days of exercise.  We will continue to monitor his progress.  Out since last review (no visits this month)     Expected Outcomes  Short: Use RPE daily to regulate intensity. Long: Follow program prescription in THR.  Short: Continue to attend regularly  Long: Continue to follow program prescription.  --        Discharge Exercise Prescription (Final Exercise Prescription Changes): Exercise Prescription Changes - 05/14/19 1300      Response to Exercise   Blood Pressure (Admit)  126/74    Blood Pressure (Exercise)  140/72    Blood Pressure (Exit)  140/82    Heart Rate (Admit)  76 bpm    Heart Rate (Exercise)  88 bpm    Heart Rate (Exit)  78 bpm    Oxygen Saturation (Admit)  97 %    Oxygen Saturation (Exercise)  95 %    Oxygen Saturation (Exit)  98 %    Rating of Perceived Exertion (Exercise)  11    Perceived Dyspnea (Exercise)  1    Symptoms  fatigue    Comments  second full day of exercise    Duration  Progress to 30 minutes of  aerobic without signs/symptoms of physical distress    Intensity  THRR unchanged      Progression   Progression  Continue to progress workloads to maintain intensity without signs/symptoms of physical distress.    Average METs  2.24      Resistance Training   Training Prescription  Yes    Weight   5lb    Reps  10-15      Interval Training   Interval Training  No      Treadmill   MPH  1.6    Grade  0    Minutes  15    METs  2.23      Recumbant Bike   Level  3    Watts  12    Minutes  15    METs  2.39      Arm Ergometer   Level  1    Minutes  15    METs  2.1       Nutrition:  Target Goals: Understanding of nutrition guidelines, daily intake of sodium '1500mg'$ , cholesterol '200mg'$ , calories 30% from fat and 7% or less from saturated fats, daily to have 5 or more servings of fruits and vegetables.  Education: [place]  verbal and written material supporting the discussion of sodium use in heart healthy nutrition. Review and explanation with models, verbal and written materials for utilization of the food label.   Education: General Nutrition Guidelines/Fats and Fiber: -Group instruction provided by verbal, written material, models and posters to present the general guidelines for heart healthy nutrition. Gives an explanation and review of dietary fats and fiber.   Biometrics: Pre Biometrics - 04/28/19 1636      Pre Biometrics   Height  6' 0.5" (1.842 m)    Weight  180 lb 14.4 oz (82.1 kg)    BMI (Calculated)  24.18        Nutrition Therapy Plan and Nutrition Goals:   Nutrition Assessments: Nutrition Assessments - 04/28/19 1635      MEDFICTS Scores   Pre Score  44       MEDIFICTS Score Key:          ?70 Need to make dietary changes          40-70 Heart Healthy Diet         ? 40 Therapeutic Level Cholesterol Diet  Nutrition Goals Re-Evaluation:   Nutrition Goals Discharge (Final Nutrition Goals Re-Evaluation):   Psychosocial: Target Goals: Acknowledge presence or absence of significant depression and/or stress, maximize coping skills, provide positive support system. Participant is able to verbalize types and ability to use techniques and skills needed for reducing stress and depression.   Education: Depression - Provides group verbal and written instruction on the correlation between heart/lung disease and depressed mood, treatment options, and the stigmas associated with seeking treatment.   Education: Sleep Hygiene -Provides group verbal and written instruction about how sleep can affect your health.  Define sleep hygiene, discuss sleep cycles and impact of sleep habits. Review good sleep hygiene tips.    Education: Stress and Anxiety: - Provides group verbal and written instruction about the health risks of elevated stress and causes of high  stress.  Discuss the correlation between heart/lung disease and anxiety and treatment options. Review healthy ways to manage with stress and anxiety.   Initial Review & Psychosocial Screening: Initial Psych Review & Screening - 04/22/19 1552      Initial Review   Current issues with  None Identified      Family Dynamics   Good Support System?  Yes   Mom and Aunt .     Barriers   Psychosocial barriers to participate in program  There are no identifiable barriers or psychosocial needs.;The patient should benefit from training in stress management and relaxation.      Screening Interventions   Interventions  Encouraged to exercise;To provide support and resources with identified psychosocial needs;Provide feedback about the scores to participant    Expected Outcomes  Short Term goal: Utilizing psychosocial counselor, staff and physician to assist with identification of specific Stressors or current issues interfering with healing process. Setting desired goal for each stressor or current issue identified.;Long Term Goal: Stressors or current issues are controlled or eliminated.;Short Term goal: Identification and review with participant of any Quality of Life or Depression concerns found by scoring the questionnaire.;Long Term goal: The participant improves quality of Life and PHQ9 Scores as seen by post scores and/or verbalization of changes       Quality of Life Scores:  Scores of 19 and below usually indicate a poorer quality of life in these areas.  A difference of  2-3 points is a clinically meaningful difference.  A difference of 2-3  points in the total score of the Quality of Life Index has been associated with significant improvement in overall quality of life, self-image, physical symptoms, and general health in studies assessing change in quality of life.  PHQ-9: Recent Review Flowsheet Data    Depression screen [facility] 2/9 04/28/2019 04/21/2019 01/21/2019 09/05/2018 08/19/2018   Decreased  Interest 0 0 0 0 0   Down, Depressed, Hopeless 0 0 0 0 0   PHQ - 2 Score 0 0 0 0 0   Altered sleeping 1 1 0 0 3   Tired, decreased energy 1 2 0 0 1   Change in appetite 0 0 0 0 1   Feeling bad or failure about yourself  0 0 0 0 0   Trouble concentrating 0 0 0 0 0   Moving slowly or fidgety/restless 0 0 0 0 0   Suicidal thoughts 0 0 0 0 0   PHQ-9 Score 2 3 0 0 5   Difficult doing work/chores Not difficult at all Not difficult at all - Not difficult at all Not difficult at all     Interpretation of Total Score  Total Score Depression Severity:  1-4 = Minimal depression, 5-9 = Mild depression, 10-14 = Moderate depression, 15-19 = Moderately severe depression, 20-27 = Severe depression   Psychosocial Evaluation and Intervention: Psychosocial Evaluation - 04/22/19 1607      Psychosocial Evaluation & Interventions   Interventions  Encouraged to exercise with the program and follow exercise prescription    Comments  Louis [name] has no barriers to attending the Pulmonary Rehab program. He is in oprocess of being on Kidney Transplant list and understands this program can help him be more physically prepared when he is called for a transplant. He lives with a roommate and has a good support system with his Mom and Elenor Legato. He is learning a home dialysis process so he can do his own dialysuis and save time not being required to go to a dialysis facility. He should do well with the program.    Expected Outcomes  STG: Louis [name] is able to schedule and attend at least 2 sessions a week, hoping to be 3 sessions a week.  LTG: Louis [name] has the tools and resources when he graduates to continue to fight his lung disease and complete his ADLs without tooo much effort.  He is able to walk further than his driveway before getting SOB.    Continue Psychosocial Services   Follow up required by staff       Psychosocial Re-Evaluation:   Psychosocial Discharge (Final Psychosocial Re-Evaluation):   Education: Education Goals:  Education classes will be provided on a weekly basis, covering required topics. Participant will state understanding/return demonstration of topics presented.  Learning Barriers/Preferences: Learning Barriers/Preferences - 04/22/19 1554      Learning Barriers/Preferences   Learning Barriers  None    Learning Preferences  None       General Pulmonary Education Topics:  Infection Prevention: - Provides verbal and written material to individual with discussion of infection control including proper hand washing and proper equipment cleaning during exercise session.   Pulmonary Rehab from 04/28/2019 in [facility]  Date  04/28/19  Educator  AS  Instruction Review Code  1- Verbalizes Understanding      Falls Prevention: - Provides verbal and written material to individual with discussion of falls prevention and safety.   Pulmonary Rehab from 04/28/2019 in [facility] Re[facility]  Date  04/28/19  Educator  AS  Instruction Review Code  1- Verbalizes Understanding      Chronic Lung Diseases: - Group verbal and written instruction to review updates, respiratory medications, advancements in procedures and treatments. Discuss use of supplemental oxygen including available portable oxygen systems, continuous and intermittent flow rates, concentrators, personal use and safety guidelines. Review proper use of inhaler and spacers. Provide informative websites for self-education.    Energy Conservation: - Provide group verbal and written instruction for methods to conserve energy, plan and organize activities. Instruct on pacing techniques, use of adaptive equipment and posture/positioning to relieve shortness of breath.   Triggers and Exacerbations: - Group verbal and written instruction to review types of environmental triggers and ways to prevent exacerbations. Discuss weather changes, air quality and the benefits of nasal washing. Review warning signs and  symptoms to help prevent infections. Discuss techniques for effective airway clearance, coughing, and vibrations.   AED/CPR: - Group verbal and written instruction with the use of models to demonstrate the basic use of the AED with the basic ABC's of resuscitation.   Anatomy and Physiology of the Lungs: - Group verbal and written instruction with the use of models to provide basic lung anatomy and physiology related to function, structure and complications of lung disease.   Anatomy & Physiology of the Heart: - Group verbal and written instruction and models provide basic cardiac anatomy and physiology, with the coronary electrical and arterial systems. Review of Valvular disease and Heart Failure   Cardiac Medications: - Group verbal and written instruction to review commonly prescribed medications for heart disease. Reviews the medication, class of the drug, and side effects.   Other: -Provides group and verbal instruction on various topics (see comments)   Knowledge Questionnaire Score: Knowledge Questionnaire Score - 04/28/19 1633      Knowledge Questionnaire Score   Pre Score  15/18        Core Components/Risk Factors/Patient Goals at Admission: Personal Goals and Risk Factors at Admission - 04/28/19 1640      Core Components/Risk Factors/Patient Goals on Admission    Weight Management  Weight Gain;Yes    Intervention  Weight Management: Develop a combined nutrition and exercise program designed to reach desired caloric intake, while maintaining appropriate intake of nutrient and fiber, sodium and fats, and appropriate energy expenditure required for the weight goal.;Weight Management: Provide education and appropriate resources to help participant work on and attain dietary goals.    Admit Weight  180 lb 14.4 oz (82.1 kg)    Goal Weight: Short Term  190 lb (86.2 kg)    Goal Weight: Long Term  210 lb (95.3 kg)    Expected Outcomes  Short Term: Continue to assess and  modify interventions until short term weight is achieved;Long Term: Adherence to nutrition and physical activity/exercise program aimed toward attainment of established weight goal;Weight Gain: Understanding of general recommendations for a high calorie, high protein meal plan that promotes weight gain by distributing calorie intake throughout the day with the consumption for 4-5 meals, snacks, and/or supplements    Heart Failure  Yes    Intervention  Provide a combined exercise and nutrition program that is supplemented with education, support and counseling about heart failure. Directed toward relieving symptoms such as shortness of breath, decreased exercise tolerance, and extremity edema.    Expected Outcomes  Improve functional capacity of life;Short term: Attendance in program 2-3 days a week with increased exercise capacity. Reported lower sodium intake. Reported increased fruit and vegetable  intake. Reports medication compliance.;Short term: Daily weights obtained and reported for increase. Utilizing diuretic protocols set by physician.;Long term: Adoption of self-care skills and reduction of barriers for early signs and symptoms recognition and intervention leading to self-care maintenance.       Education:Diabetes - Individual verbal and written instruction to review signs/symptoms of diabetes, desired ranges of glucose level fasting, after meals and with exercise. Acknowledge that pre and post exercise glucose checks will be done for 3 sessions at entry of program.   Education: Know Your Numbers and Risk Factors: -Group verbal and written instruction about important numbers in your health.  Discussion of what are risk factors and how they play a role in the disease process.  Review of Cholesterol, Blood Pressure, Diabetes, and BMI and the role they play in your overall health.   Core Components/Risk Factors/Patient Goals Review:    Core Components/Risk Factors/Patient Goals at Discharge  (Final Review):    ITP Comments: ITP Comments    Row Name 04/22/19 1605 05/05/19 1554 05/07/19 1020 05/28/19 1545 05/28/19 1547   ITP Comments  Virtual Orientation completed today. Appointment for EP eval and gym orientation set for 3/15 at 3 PM. Documentation of diagnosis can be found in [facility] 03/21/2019.  First full day of exercise!  Patient was oriented to gym and equipment including functions, settings, policies, and procedures.  Patient's individual exercise prescription and treatment plan were reviewed.  All starting workloads were established based on the results of the 6 minute walk test done at initial orientation visit.  The plan for exercise progression was also introduced and progression will be customized based on patient's performance and goals.  30 day chart review completed. ITP sent to Dr Zachery Dakins Medical Director, for review,changes as needed and signature. New to program  Out for month  Called to check in on pt as pt has not been to rehab since the end of march. Pt reports tooth has been bothering him and he is getting it pulled the 19th, will f/u with pt for return to rehab.   Gracey Name 06/04/19 0631           ITP Comments  30 Day review completed. Medical Director review done, changes made as directed,and approval shown by signature of Market researcher.          Comments:

## 2019-06-04 NOTE — Telephone Encounter (Signed)
Fax was received of pt's ONO results from [place] and has been faxed to Phylis Bougie with [facility] Pulmonary.

## 2019-06-05 ENCOUNTER — Telehealth: Payer: Self-pay

## 2019-06-05 DIAGNOSIS — Z9189 Other specified personal risk factors, not elsewhere classified: Principal | ICD-10-CM

## 2019-06-05 DIAGNOSIS — R791 Abnormal coagulation profile: Principal | ICD-10-CM

## 2019-06-05 NOTE — Telephone Encounter (Signed)
Called to check in on patient after oral surgery and to see when he would feel ready to return to cardiac rehab.

## 2019-06-06 ENCOUNTER — Telehealth: Payer: Self-pay | Admitting: Internal Medicine

## 2019-06-06 NOTE — Telephone Encounter (Signed)
Dr. Chase Caller patient called and stated that he started feeling bad on Tuesday with a dry cough and headache that's not getting any better. Patient also states that he was wearing oxygen before and stopped and started wearing it again on Tuesday to see if it would help but it hasn't.   Please advise

## 2019-06-09 ENCOUNTER — Encounter: Admit: 2019-06-09 | Discharge: 2019-06-10 | Payer: MEDICARE

## 2019-06-09 ENCOUNTER — Other Ambulatory Visit
Admission: RE | Admit: 2019-06-09 | Discharge: 2019-06-09 | Disposition: A | Discharge status: Home / Self Care | Payer: PRIVATE HEALTH INSURANCE | Source: Ambulatory Visit | Attending: Nephrology | Admitting: Nephrology

## 2019-06-09 ENCOUNTER — Telehealth: Payer: Self-pay

## 2019-06-09 DIAGNOSIS — Z9189 Other specified personal risk factors, not elsewhere classified: Secondary | ICD-10-CM | POA: Diagnosis not present

## 2019-06-09 DIAGNOSIS — I10 Essential (primary) hypertension: Secondary | ICD-10-CM | POA: Diagnosis not present

## 2019-06-09 DIAGNOSIS — N186 End stage renal disease: Secondary | ICD-10-CM | POA: Diagnosis not present

## 2019-06-09 DIAGNOSIS — R791 Abnormal coagulation profile: Secondary | ICD-10-CM | POA: Diagnosis not present

## 2019-06-09 DIAGNOSIS — R0600 Dyspnea, unspecified: Secondary | ICD-10-CM | POA: Diagnosis not present

## 2019-06-09 LAB — CBC WITH DIFFERENTIAL/PLATELET
Abs Immature Granulocytes: 0.02 10*3/uL (ref 0.00–0.07)
Basophils Absolute: 0 10*3/uL (ref 0.0–0.1)
Basophils Relative: 0 %
Eosinophils Absolute: 0.1 10*3/uL (ref 0.0–0.5)
Eosinophils Relative: 2 %
HCT: 16.3 % — ABNORMAL LOW (ref 39.0–52.0)
Hemoglobin: 5.7 g/dL — ABNORMAL LOW (ref 13.0–17.0)
Immature Granulocytes: 0 %
Lymphocytes Relative: 19 %
Lymphs Abs: 1.2 10*3/uL (ref 0.7–4.0)
MCH: 33.7 pg (ref 26.0–34.0)
MCHC: 35 g/dL (ref 30.0–36.0)
MCV: 96.4 fL (ref 80.0–100.0)
Monocytes Absolute: 0.5 10*3/uL (ref 0.1–1.0)
Monocytes Relative: 8 %
Neutro Abs: 4.3 10*3/uL (ref 1.7–7.7)
Neutrophils Relative %: 71 %
Platelets: 111 10*3/uL — ABNORMAL LOW (ref 150–400)
RBC: 1.69 MIL/uL — ABNORMAL LOW (ref 4.22–5.81)
RDW: 17.9 % — ABNORMAL HIGH (ref 11.5–15.5)
WBC: 6.1 10*3/uL (ref 4.0–10.5)
nRBC: 0 % (ref 0.0–0.2)

## 2019-06-09 NOTE — Telephone Encounter (Signed)
Checked in with pt to see if he was feeling better to come back, pt reports that he has not gotten his tooth extracted yet due to hemoglobin being low. Is getting iron infusion Wednesday and hopes to get tooth extracted Monday, will follow up with pt next week 5/5 or 5/6.

## 2019-06-10 ENCOUNTER — Encounter
Admit: 2019-06-10 | Discharge: 2019-06-11 | Disposition: A | Discharge status: Home / Self Care | Payer: MEDICARE | Attending: Student in an Organized Health Care Education/Training Program

## 2019-06-10 DIAGNOSIS — E875 Hyperkalemia: Secondary | ICD-10-CM | POA: Diagnosis not present

## 2019-06-10 DIAGNOSIS — N186 End stage renal disease: Secondary | ICD-10-CM | POA: Diagnosis not present

## 2019-06-10 DIAGNOSIS — R918 Other nonspecific abnormal finding of lung field: Secondary | ICD-10-CM | POA: Diagnosis not present

## 2019-06-10 DIAGNOSIS — I120 Hypertensive chronic kidney disease with stage 5 chronic kidney disease or end stage renal disease: Secondary | ICD-10-CM | POA: Diagnosis not present

## 2019-06-10 DIAGNOSIS — N189 Chronic kidney disease, unspecified: Secondary | ICD-10-CM | POA: Diagnosis not present

## 2019-06-10 DIAGNOSIS — Z992 Dependence on renal dialysis: Secondary | ICD-10-CM | POA: Diagnosis not present

## 2019-06-10 DIAGNOSIS — D631 Anemia in chronic kidney disease: Secondary | ICD-10-CM | POA: Diagnosis not present

## 2019-06-10 DIAGNOSIS — J189 Pneumonia, unspecified organism: Principal | ICD-10-CM

## 2019-06-10 MED ORDER — DOXYCYCLINE HYCLATE 100 MG CAPSULE
ORAL_CAPSULE | Freq: Two times a day (BID) | ORAL | 0 refills | 10.00000 days | Status: CP
Start: 2019-06-10 — End: 2019-06-20

## 2019-06-10 MED ORDER — GUAIFENESIN 100 MG/5 ML ORAL LIQUID
Freq: Three times a day (TID) | ORAL | 0 refills | 4 days | Status: CP | PRN
Start: 2019-06-10 — End: ?

## 2019-06-10 MED ORDER — DOXYCYCLINE HYCLATE 100 MG CAPSULE: 100 mg | capsule | Freq: Two times a day (BID) | 0 refills | 10 days | Status: AC

## 2019-06-10 NOTE — Telephone Encounter (Signed)
If he can wait till 06/13/19 office visit will be able to come up with a better plan. If not, please find out if he has fever, wheeze, color of sputum, severity or any associated wheezing or chest tightness or shortness of breath  Also please inquire about the headache if he has any double vision.  Location of the headache any nausea vomiting diarrhea.  How severe it is.

## 2019-06-10 NOTE — Telephone Encounter (Signed)
Spoke with the pt  He states currently at the hospital  He went to have dental work done and his hemoglobin was low and they said he needed a blood transfusion  He is getting this done now  He states that his HA has been moderately severe and he has had no diarrhea or vision changes or fever, but has had vomiting off and on the last several days  I advised that he let the ED providers know all the symptoms he is having so he can be evaluated  Will forward to MR to make him aware

## 2019-06-11 ENCOUNTER — Ambulatory Visit: Payer: PRIVATE HEALTH INSURANCE

## 2019-06-11 NOTE — Telephone Encounter (Signed)
PT received meds-- both prescriptions are wrong.  Please call pt [phone]

## 2019-06-11 NOTE — Telephone Encounter (Signed)
Agree with ER eval of these symptoms

## 2019-06-11 NOTE — Telephone Encounter (Signed)
LMTCB x1 for pt.  

## 2019-06-12 MED ORDER — ONDANSETRON 4 MG DISINTEGRATING TABLET
ORAL_TABLET | Freq: Three times a day (TID) | ORAL | 0 refills | 5.00000 days | Status: CP | PRN
Start: 2019-06-12 — End: 2019-06-17

## 2019-06-12 NOTE — Telephone Encounter (Signed)
Called and spoke with patient about his prescriptions. These were sent in by the ED. Patient says that it is not the normal DOXY that he normally takes and he needs it to be fixed. I asked patient if he has called or asked the pharmacy about it and he said no. Patient has an appointment tomorrow with MR. I advised patient to call or go to the pharmacy to ask them about the medication and that I would send a message to MR to see if he is willing to send in another prescription for DOXY before his appointment tomorrow.  Dr. Chase Caller please advise.

## 2019-06-12 NOTE — Telephone Encounter (Signed)
Pt returning call.  [phone]

## 2019-06-13 ENCOUNTER — Encounter: Admit: 2019-06-13 | Discharge: 2019-06-14 | Payer: MEDICARE

## 2019-06-13 ENCOUNTER — Encounter: Payer: Self-pay | Admitting: Internal Medicine

## 2019-06-13 ENCOUNTER — Other Ambulatory Visit: Payer: Self-pay

## 2019-06-13 ENCOUNTER — Ambulatory Visit (INDEPENDENT_AMBULATORY_CARE_PROVIDER_SITE_OTHER): Payer: PRIVATE HEALTH INSURANCE | Admitting: Internal Medicine

## 2019-06-13 VITALS — BP 142/86 | HR 90 | Ht 74.0 in | Wt 176.0 lb

## 2019-06-13 DIAGNOSIS — Z862 Personal history of diseases of the blood and blood-forming organs and certain disorders involving the immune mechanism: Secondary | ICD-10-CM

## 2019-06-13 DIAGNOSIS — R0600 Dyspnea, unspecified: Secondary | ICD-10-CM

## 2019-06-13 DIAGNOSIS — J849 Interstitial pulmonary disease, unspecified: Secondary | ICD-10-CM

## 2019-06-13 DIAGNOSIS — J440 Chronic obstructive pulmonary disease with acute lower respiratory infection: Secondary | ICD-10-CM

## 2019-06-13 DIAGNOSIS — J439 Emphysema, unspecified: Secondary | ICD-10-CM

## 2019-06-13 DIAGNOSIS — R911 Solitary pulmonary nodule: Secondary | ICD-10-CM

## 2019-06-13 DIAGNOSIS — R0609 Other forms of dyspnea: Secondary | ICD-10-CM

## 2019-06-13 DIAGNOSIS — N189 Chronic kidney disease, unspecified: Secondary | ICD-10-CM

## 2019-06-13 DIAGNOSIS — J209 Acute bronchitis, unspecified: Secondary | ICD-10-CM

## 2019-06-13 MED ORDER — DOXYCYCLINE HYCLATE 100 MG PO CAPS: 100.0000 mg | ORAL_CAPSULE | Freq: Two times a day (BID) | ORAL | 0 refills | Status: AC

## 2019-06-13 NOTE — Progress Notes (Signed)
OV 01/31/2019  Subjective:  Patient ID: Louis [name], male , DOB: 24-Oct-1960 , age 59 y.o. , MRN: [id number] , ADDRESS: [place] [id number] PCP Steele Sizer, MD   01/31/2019 -   Chief Complaint  Patient presents with  . pulmonary consult    per Dr. Ancil Boozer. dx with PNA 10/2018. since he has had sob with exertion.      HPI Louis [name] 59 y.o. -referred by primary care physician for shortness of breath on exertion relieved by rest.  He tells me that he has end-stage renal disease for the last 2 years.  He says at baseline he was doing really well with an excellent quality of life and functional status.  Then in September 2020 he had cough and shortness of breath acutely.  This was diagnosis pneumonia accordingly to him.  He was given short course antibiotics.  He says that he does not recollect if he had a fever.  And then after that a lot of the symptoms improved except the shortness of breath.  This is present on exertion relieved by rest.  It is mild to moderate in severity.  It is not progressive since it started.  However it is limiting him from doing exercises.  He says he had a cardiac work-up and therefore he was cleared.  Review of cardiac work-up indicates that his stress test on January 13, 2019 was low risk but his ejection fraction is 45-50%.  He is not aware he has chronic systolic dysfunction.  He has blood work in April 2020 showing he is anemic with a hemoglobin in the 9 g% range.  This is worse than before.  He is not aware that he is anemic.  He denies any current cough wheezing, orthopnea, proximal nocturnal dyspnea, edema.  He goes for Monday Wednesday Friday dialysis.  He says after the dialysis he feels better.  He says the shortness of breath is associated with lower chest tightness especially in the infrascapular region.  His walking desaturation test today was fine.    CXR Oct 2020 - personally visualized - clear (CT abd lung cut  2014 - LL atelectasis v scarrring)   ROS - per HPI     OV 02/21/2019 - telephone visit  Subjective:  Patient ID: Louis [name], male , DOB: 08-06-1960 , age 17 y.o. , MRN: [id number] , ADDRESS: [place] [id number]   02/21/2019 -  telephon visit to discuss HRCT/cbc results in setting of ESRD x 3 years, chronicy systolic chf,  Anemia (on iron since 3 years) , crackles and symptoms of dyspnea.     HPI Louis [name] 59 y.o. -in this telephone visit the risks, benefits and limitations of the telephone visit were discussed.  The purpose of this visit is to discuss this dyspnea work-up in terms of the results of his pulmonary function tests, CBC and also high-resolution CT chest.  The pulmonary function test clearly shows restriction with reduction in diffusion capacity suggestive of ILD.  The high-resolution CT chest suggests he might have ILD.  Although personal visualization it looks to me that he might have post pneumonia atelectasis but certainly ILD cannot be ruled out.  I agree with the radiologist on this.  If this were UIP it would be indeterminate which means lower than 50% probably of UIP.  His CBC shows worsening anemia.  He tells me that he has been on iron tablets for 3 years and  starting dialysis.  Despite this his hemoglobin seems to be getting worse.  In addition high-resolution CT chest shows emphysema.  He says he has not smoked and he does not smoke but is around a lot of smokers for many years because he lives in a basement.  He is aware this emphysema finding could be passive smoking related.  He is on Anoro but he says it is not helping him.   IMPRESSION: 1. There is a spectrum of findings which could be consistent with early or mild interstitial lung disease, at this time considered indeterminate for usual interstitial pneumonia (UIP) per current ATS guidelines. If there is persistent clinical concern for interstitial lung disease, repeat  high-resolution chest CT would be recommended in 12 months to assess for temporal changes in the appearance of the lung parenchyma. 2. Mild diffuse bronchial wall thickening with mild centrilobular emphysema. 3. Right lower lobe pulmonary nodule measuring 9 x 5 mm (mean diameter of 7 mm), favored to represent a subpleural lymph node, but nonspecific. Attention at time of repeat high-resolution chest CT is recommended to ensure stability. 4. Mild cardiomegaly. 5. Aortic atherosclerosis, in addition to 3 vessel coronary artery disease. Please note that although the presence of coronary artery calcium documents the prsence of coronary artery disease, the severity of this disease and any potential stenosis cannot be assessed on this non-gated CT examination. Assessment for potential risk factor modification, dietary therapy or pharmacologic therapy may be warranted, if cliniclly indicated.  Aortic Atherosclerosis (ICD10-I70.0) and Emphysema (ICD10-J43.9).   Electronically Signed   By: Vinnie Langton M.D.   On: 02/18/2019 16:46   ROS - per HPI    OV 03/21/2019  Subjective:  Patient ID: Louis [name], male , DOB: May 20, 1960 , age 49 y.o. , MRN: [id number] , ADDRESS: [place] [id number]  PCP Steele Sizer, MD   03/21/2019 -   Chief Complaint  Patient presents with  . Follow-up    Pt states he has been feeling about the same. Cough due to pneumonia. Occ wheezing, Pt denies any fever or chills    - ESRD x 3 years, chronicy systolic chf,  Anemia (on iron since 3 years) , HIV crackles and symptoms of dyspnea. 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 -follow-up multifactorial dyspnea in the setting of chronic systolic heart failure, anemia, end-stage renal disease with HIV.  Concern for associated emphysema, ILD and lung nodule in January 2021 CT scan.  In this visit we were supposed to check  on his dyspnea start.  In this visit we supposed to check on his dyspnea after starting Spiriva.  Also supposed to review his autoimmune labs.  He tells me that after starting Spiriva he is no better.  He is more worried about his overall fatigue which he thinks is because of blood pressure medications.  He did not do his autoimmune profile till yesterday and some of the results are back and it is still negative.  In the interim he ended up in [facility] ER a few days ago.  It also appears that he had an admission in end of January 2021.  In the ER he had no acute symptoms with the blood pressure was 193/116 with a pulse ox of 99% as reviewed by the chart.  He had a chest x-ray that I do not have the image but it says ill-defined patchy density in  the right midlung zone question developing consolidation.  He did not have any cough or fever at that time.  So the ER discharged him.  He now tells me that he actually has a cough for the last several days.  It is a recurrence of the cough.  He is frustrated by all his symptoms.  He is wondering why I did not do a chest x-ray today.  However there is a telephone visit.  He says that she was on a chest x-ray yesterday when he came for his labs.  He wanted to switch to [facility] pulmonary but they do not have an appointment for him.  Therefore he wants to continue to see me till he gets an appointment there.      Results for ASRIEL, WESTRUP (MRN [id number]) as of 03/21/2019 12:06  Ref. Range 08/23/2016 06:14 08/23/2016 14:07 08/26/2016 04:06 10/09/2016 04:05 03/20/2019 09:52  Anti Nuclear Antibody (ANA) Latest Ref Range: Negative  Negative      Angiotensin-Converting Enzyme Latest Ref Range: 14 - 82 U/L     14  RA Latex Turbid. Latest Ref Range: 0.0 - 13.9 IU/mL     <10.0  Cytoplasmic (C-ANCA) Latest Ref Range: Neg:<1:20 titer  <1:20     P-ANCA Latest Ref Range: Neg:<1:20 titer  <1:20     Atypical P-ANCA titer Latest Ref Range: Neg:<1:20 titer  <1:20       OV  04/18/2019  Subjective:  Patient ID: Louis [name], male , DOB: 29-Nov-1960 , age 15 y.o. , MRN: [id number] , ADDRESS: [place] [address]   - ESRD x 3 years, chronicy systolic chf,  Anemia (on iron since 3 years) , HIV crackles and symptoms of dyspnea. Indeterminate ILD with emphysema on CT Jan 2021 along with 9 mm subpleural nodule   04/18/2019 -   Chief Complaint  Patient presents with  . Follow-up    Pt states he thinks he has some congestion. SOB on exertion. Occ wheezing in the am. Pt denies any cough, fever, chills, or sweats     HPI Louis [name] 59 y.o. - > presents for follow-up.  Last seen 1 month ago.  I am supposed to see him only in April or May 2021 but he is here earlier.  He does not know why.  At first he said he is stable but then later he told me that he is actually feeling worse.  He feels his dyspnea is worse particularly early in the morning he at that time feels his chest is tight at the lower end.  He is also reporting worsening dyspnea.  Walking desaturation test today shows a decline.  In fact he is having hypoxemia.  This is a change from the December walking desaturation test.  We will plan for a CT scan in April 2021 but he wants this done sooner.  There is no wheezing.  He does have some mucus.  He feels a Spiriva is not helping him.  He plans to start pulmonary rehabilitation soon.      Results for Louis, [name] (MRN [id number]) as of 03/21/2019 12:06  Ref. Range 12/21/2016 15:05 12/28/2016 10:48 01/02/2017 09:06 04/29/2017 07:52 06/01/2017 09:23 05/01/2018 11:20 05/24/2018 15:34 01/31/2019 12:36 03/05/2019 15:48 03/06/2019 04:13 03/20/2019 09:52  Hemoglobin Latest Ref Range: 13.0 - 17.0 g/dL 12.9 (L) 13.9 13.2 10.1 (L) 9.8 (L) 10.8 (L) 9.4 (L) 8.8 (L) 9.6 (L) 9.7 (L)      ROS - per HPI  OV 06/13/2019  Subjective:  Patient ID: Louis [name], male , DOB: [birth year] , age 27 y.o. , MRN: [id number] , ADDRESS: [place] [id number]   06/13/2019 -   Chief Complaint  Patient presents with  . Follow-up    pt using inhaler continous and no longer using O2     HPI Louis [name] 59 y.o. -presents for follow-up with his mom.  His mom presents for the first time.  She is really worried about him.  She tells me that his anemia is worse.  In fact he ended up in the ER with a hemoglobin of less than 6 g%.  He got blood transfusion.  She is worried about GI loss.  She says primary care is only doing stool occult blood.  She is asking for a GI referral.  I have agreed to do a GI referral locally.  With the main issue is pulmonary.  He had pulmonary function test that shows continued stability.  The DLCO is uncorrected for his low hemoglobin.  He continues to have dyspnea.  He is also reporting a cough.  He says he ended up in the emergency room in an outside facility.  I do not have the chest x-ray.  They told him he had pneumonia given 5 days of doxycycline.  He states he is feeling better.  He recollects being on doxycycline for several months a few years ago for skin issues.  Therefore he is asking if he can be on doxycycline again forSeveral months in order to prevent pneumonias.  I only agreed to give him extended doxycycline capping a total 15 days at his specific request.  He was supposed to have high-resolution CT scan of the chest but he has not had this he missed the appointment.    SYMPTOM SCALE - ILD 04/18/2019  06/13/2019   O2 use ra ra  Shortness of Breath 0 -> 5 scale with 5 being worst (score 6 If unable to do) 3  At rest 0 3  Simple tasks - showers, clothes change, eating, shaving 0 3  Household (dishes, doing bed, laundry) 3 3  Shopping 3 4  Walking level at own pace 3 3  Walking up Stairs 3 4  Total (30-36) Dyspnea Score 12 20  How bad is your cough? 1 3  How bad is your fatigue 5 4  How bad is nausea 0 4  How bad is vomiting?  0   How bad is diarrhea? 0 3  How bad is anxiety? 0  3  How bad is depression 0 2    ROS - per HPI    Simple office walk 185 feet x  3 laps goal with forehead probe 01/31/2019  04/18/2019  06/13/2019   O2 used RA    Number laps completed 3 3 3   Comments about pace Moderate pace    Resting Pulse Ox/HR 98% and 67/min 99% and 73 100% and 83/min  Final Pulse Ox/HR 97% and 69/min 86% and 86/min 95% and 101/min  Desaturated </= 88% no yes no  Desaturated <= 3% points no yes Yes, 5 ponts  Got Tachycardic >/= 90/min no no   Symptoms at end of test Very mild dyspnea    Miscellaneous comments x ? worse      PFT FVC fev1 ratio BD fev1 TLC DLCO uncorr  02/20/2019  3.38L/71% 2.78L/70% x/97% x 4.4L/56% 20.7/65%  06/03/2019 3.45/77%     17.6/57%   Results for  Louis [name], Louis [name] (MRN [id number]) as of 06/13/2019 09:48  Ref. Range 06/09/2019 15:55  Hemoglobin Latest Ref Range: 13.0 - 17.0 g/dL 5.7 (L)     has a past medical history of Chronic kidney disease, Dermatophytosis of foot (06/23/2004), Environmental allergies, Gout, HIV infection ([place]) (12/1995), Hypertension, and Seborrhea (06/23/2004).   reports that he has never smoked. He has never used smokeless tobacco.  Past Surgical History:  Procedure Laterality Date  . CAPD INSERTION N/A 12/28/2016   Procedure: LAPAROSCOPIC INSERTION CONTINUOUS AMBULATORY PERITONEAL DIALYSIS  (CAPD) CATHETER;  Surgeon: Algernon Huxley, MD;  Location: [facility] ORS;  Service: Vascular;  Laterality: N/A;  . DIALYSIS/PERMA CATHETER INSERTION N/A 10/09/2016   Procedure: DIALYSIS/PERMA CATHETER INSERTION;  Surgeon: Algernon Huxley, MD;  Location: [place] [facility];  Service: Cardiovascular;  Laterality: N/A;  . DIALYSIS/PERMA CATHETER REMOVAL N/A 02/19/2017   Procedure: DIALYSIS/PERMA CATHETER REMOVAL;  Surgeon: Algernon Huxley, MD;  Location: [place] [facility];  Service: Cardiovascular;  Laterality: N/A;  . HERNIA REPAIR Bilateral    inguinal    Allergies  Allergen Reactions  . Losartan Swelling    Lip swelling  .  Quinapril Hcl Swelling    Face swells    Immunization History  Administered Date(s) Administered  . Hepatitis A 07/11/2012  . Hepatitis A, Adult 03/13/2013  . Hepatitis B 06/22/2004, 07/11/2012  . Hepatitis B, adult 11/07/2012, 03/13/2013, 01/17/2017, 02/18/2018  . Influenza Split 11/03/2011  . Influenza Whole 01/02/1996  . Influenza,inj,Quad PF,6+ Mos 10/05/2017  . Influenza-Unspecified 10/31/2012, 02/15/2016, 11/28/2016, 03/04/2018, 11/13/2018  . PPD Test 10/08/2016, 02/11/2018, 02/18/2018  . Pneumococcal Conjugate-13 05/10/2018  . Pneumococcal Polysaccharide-23 06/22/2004, 02/28/2012  . Tdap 10/05/2017    Family History  Problem Relation Age of Onset  . Bradycardia Maternal Grandmother        with pacemaker     Current Outpatient Medications:  .  B Complex-C-Folic Acid (RENA-VITE RX) 1 MG TABS, Take 1 tablet by mouth daily., Disp: , Rfl:  .  budesonide-formoterol (SYMBICORT) 80-4.5 MCG/ACT inhaler, Inhale 2 puffs into the lungs 2 (two) times daily., Disp: 1 Inhaler, Rfl: 12 .  cetirizine (ZYRTEC) 10 MG tablet, Take 1 tablet (10 mg total) by mouth daily., Disp: 30 tablet, Rfl: 11 .  DESCOVY 200-25 MG tablet, Take 1 tablet by mouth daily., Disp: , Rfl:  .  diclofenac sodium (VOLTAREN) 1 % GEL, Apply 2 g topically 4 (four) times daily., Disp: 100 g, Rfl: 0 .  ferrous sulfate 325 (65 FE) MG tablet, Take 1 tablet by mouth daily., Disp: , Rfl:  .  furosemide (LASIX) 40 MG tablet, Take 80 mg by mouth daily. , Disp: , Rfl:  .  hydrALAZINE (APRESOLINE) 100 MG tablet, Take 150 mg by mouth 3 (three) times daily., Disp: , Rfl:  .  methocarbamol (ROBAXIN) 500 MG tablet, Take 500 mg by mouth 2 (two) times daily as needed. for pain, Disp: , Rfl:  .  predniSONE (DELTASONE) 10 MG tablet, Take 1 tablet (10 mg total) by mouth 2 (two) times daily as needed. Prn gout attack for 2 days, Disp: 10 tablet, Rfl: 0 .  SPIRIVA RESPIMAT 1.25 MCG/ACT AERS, INHALE 2 PUFFS BY MOUTH INTO THE LUNGS DAILY,  Disp: 4 g, Rfl: 3 .  TIVICAY 50 MG tablet, Take 1 tablet by mouth daily., Disp: , Rfl:  .  valACYclovir (VALTREX) 500 MG tablet, Take 500 mg by mouth daily as needed (for flare ups). , Disp: , Rfl:  .  amLODipine (NORVASC) 10  MG tablet, Take 1 tablet (10 mg total) by mouth daily. (Patient not taking: Reported on 06/13/2019), Disp: 30 tablet, Rfl: 0 .  AURYXIA 1 GM 210 MG(Fe) tablet, Take 1 tablet by mouth 4 (four) times daily -  before meals and at bedtime., Disp: , Rfl:  .  carvedilol (COREG) 25 MG tablet, Take 1 tablet (25 mg total) by mouth 2 (two) times daily., Disp: 180 tablet, Rfl: 3 .  cloNIDine (CATAPRES - DOSED IN MG/24 HR) 0.2 mg/24hr patch, Place 1 patch (0.2 mg total) onto the skin once a week. (Patient not taking: Reported on 06/13/2019), Disp: 4 patch, Rfl: 12 .  Febuxostat 80 MG TABS, Take 1 tablet by mouth daily., Disp: , Rfl:  .  fluticasone (FLONASE) 50 MCG/ACT nasal spray, Place into the nose., Disp: , Rfl:  .  halobetasol (ULTRAVATE) 0.05 % cream, APPLY TO AFFECTED AREA TWICE A DAY, Disp: , Rfl:       Objective:   Vitals:   06/13/19 0917  BP: (!) 142/86  Pulse: 90  SpO2: 98%  Weight: 176 lb (79.8 kg)  Height: 6\' 2"  (1.88 m)    Estimated body mass index is 22.6 kg/m as calculated from the following:   Height as of this encounter: 6\' 2"  (1.88 m).   Weight as of this encounter: 176 lb (79.8 kg).  @WEIGHTCHANGE @  [employer]   06/13/19 0917  Weight: 176 lb (79.8 kg)     Physical Exam Tall alert and oriented male.   deconditioned.  Bilateral Velcro crackles with a cranial caudal gradient.  Otherwise overall      Assessmen caudal gradientt:       ICD-10-CM   1. Acute bronchitis with COPD ([place])  J44.0    J20.9   2. ILD (interstitial lung disease) ([place])  J84.9   3. Pulmonary emphysema, unspecified emphysema type ([place])  J43.9   4. Nodule of lower lobe of right lung  R91.1   5. Dyspnea on exertion  R06.00   6. History of anemia due to CKD  N18.9    Z86.2      I agree to extend his doxycycline for a total of 15 days.  Explained to him that the reason he is having "pneumonia" might be due to basal interstitial lung disease of which the etiology is not known.  Told him the answer to sorting out his interstitial lung disease is to repeat high-resolution CT chest and depending on the results evaluate him for biopsy.  We went over bronchoscopy with lavage and transbronchial biopsy for RNA genomic analysis versus surgical lung biopsy.  We will have to bridge this decision based on the CT results.  Mom seems to be inclined toward leaning towards surgical lung biopsy.  He would prefer to have this in [place] as opposed to [place].  Will have a CT and discussed with him in 1 week.  The CT should capture issues with his nodule.  His anemia is worse.  At the family's request I made a referral to GI locally.    Plan:     Patient Instructions     ICD-10-CM   1. Acute bronchitis with COPD ([place])  J44.0    J20.9   2. ILD (interstitial lung disease) ([place])  J84.9   3. Pulmonary emphysema, unspecified emphysema type ([place])  J43.9   4. Dyspnea on exertion  R06.00   5. Nodule of lower lobe of right lung  R91.1    Acute bronchhitis   - extend  doxycycline 100mg  bid x another 10 days per your personal request   ILD (interstitial lung disease) ([place])  -Idiopathic interstitial ILD. - seems persistent on PFT  Plan - rpeat HRCT supine and prone, inspiration and expiration  - next 1 week - based on above - reconsider biopsy decision - might be ok to to bronchoscopy with BAL and envisia biopsy  V surgical lung bx (Mom prefers surgical biopsy)   Pulmonary emphysema, unspecified emphysema type ([place])  No improvement in dyspnea despite starting Spiriva  Plan  -For now continue Spiriva - continue symbicort 2 puff twice daily  Dyspnea is multifactorial  Plan  -Glad you are going pulmonary rehabilitation   Right-sided pulmonary nodule 9 mm January  2021  Plan  -will capture in high-resolution CT chest   Anemia - worse/new   - refer GI at your mom's and your request   Follow-up  - < 1 week with Dr Chase Caller in  Telephone/video visit to discuss CT results   ( Level 05 visit: Estb 40-54 min   in  visit type: on-site physical face to visit  in total care time and counseling or/and coordination of care by this undersigned MD - Dr Brand Males. This includes one or more of the following on this same day 06/13/2019: pre-charting, chart review, note writing, documentation discussion of test results, diagnostic or treatment recommendations, prognosis, risks and benefits of management options, instructions, education, compliance or risk-factor reduction. It excludes time spent by the [facility] or office staff in the care of the patient. Actual time 44 min)   SIGNATURE    Dr. Brand Males, M.D., F.C.C.P,  Pulmonary and Critical Care Medicine Staff Physician, [place] Director - Interstitial Lung Disease  Program  Pulmonary [place] [facility] [place], [place], [address]  Pager: [phone], If no answer or between  15:00h - 7:00h: call [phone] Telephone: [phone]  10:15 AM 06/13/2019

## 2019-06-13 NOTE — Telephone Encounter (Signed)
Patient was seen in the office today at [facility] and is concerns addressed

## 2019-06-13 NOTE — Patient Instructions (Addendum)
ICD-10-CM   1. Acute bronchitis with COPD ([place])  J44.0    J20.9   2. ILD (interstitial lung disease) ([place])  J84.9   3. Pulmonary emphysema, unspecified emphysema type ([place])  J43.9   4. Dyspnea on exertion  R06.00   5. Nodule of lower lobe of right lung  R91.1    Acute bronchhitis   - extend doxycycline 100mg  bid x another 10 days per your personal request   ILD (interstitial lung disease) ([place])  -Idiopathic interstitial ILD. - seems persistent on PFT  Plan - rpeat HRCT supine and prone, inspiration and expiration  - next 1 week - based on above - reconsider biopsy decision - might be ok to to bronchoscopy with BAL and envisia biopsy  V surgical lung bx (Mom prefers surgical biopsy)   Pulmonary emphysema, unspecified emphysema type ([place])  No improvement in dyspnea despite starting Spiriva  Plan  -For now continue Spiriva - continue symbicort 2 puff twice daily  Dyspnea is multifactorial  Plan  -Glad you are going pulmonary rehabilitation   Right-sided pulmonary nodule 9 mm January 2021  Plan  -will capture in high-resolution CT chest   Anemia - worse/new   - refer GI at your mom's and your request   Follow-up  - < 1 week with Dr Chase Caller in  Telephone/video visit to discuss CT results

## 2019-06-16 ENCOUNTER — Encounter: Admit: 2019-06-16 | Discharge: 2019-06-17 | Payer: MEDICARE

## 2019-06-16 ENCOUNTER — Telehealth: Payer: Self-pay | Admitting: Internal Medicine

## 2019-06-16 DIAGNOSIS — D631 Anemia in chronic kidney disease: Secondary | ICD-10-CM | POA: Diagnosis not present

## 2019-06-16 DIAGNOSIS — K029 Dental caries, unspecified: Secondary | ICD-10-CM | POA: Diagnosis not present

## 2019-06-16 DIAGNOSIS — Z992 Dependence on renal dialysis: Secondary | ICD-10-CM | POA: Diagnosis not present

## 2019-06-16 DIAGNOSIS — R791 Abnormal coagulation profile: Secondary | ICD-10-CM | POA: Diagnosis not present

## 2019-06-16 DIAGNOSIS — N186 End stage renal disease: Secondary | ICD-10-CM | POA: Diagnosis not present

## 2019-06-16 DIAGNOSIS — F1210 Cannabis abuse, uncomplicated: Principal | ICD-10-CM

## 2019-06-16 DIAGNOSIS — Z01818 Encounter for other preprocedural examination: Principal | ICD-10-CM

## 2019-06-16 NOTE — Telephone Encounter (Signed)
I spoke with pt, he states Dr. Chase Caller wanted him to be out of work and states he didn't specify how long. Pt is supposed to be having a biopsy of his lung. Dr. Chase Caller how long do you think pt will need to be out of work? His CT is on 06/25/2019 please advise.

## 2019-06-18 ENCOUNTER — Telehealth: Payer: Self-pay | Admitting: Internal Medicine

## 2019-06-18 DIAGNOSIS — N186 End stage renal disease: Secondary | ICD-10-CM | POA: Diagnosis not present

## 2019-06-18 DIAGNOSIS — D631 Anemia in chronic kidney disease: Secondary | ICD-10-CM | POA: Diagnosis not present

## 2019-06-18 DIAGNOSIS — Z992 Dependence on renal dialysis: Secondary | ICD-10-CM | POA: Diagnosis not present

## 2019-06-18 NOTE — Telephone Encounter (Signed)
Pt calling regarding having paperwork filled out for disability.  Please advise.  Fax:  [phone].  Pt needs done as quick as possible.  Pt's number [phone]

## 2019-06-18 NOTE — Telephone Encounter (Signed)
Spoke with pt. He is aware that he needs to contact medical records to have his paperwork processed. Pt has been given their contact information.

## 2019-06-18 NOTE — Telephone Encounter (Signed)
Pt called back about this matter-- can be reached at [phone] please call tomorrow morning 5/6

## 2019-06-19 ENCOUNTER — Encounter: Payer: Self-pay | Admitting: *Deleted

## 2019-06-19 DIAGNOSIS — J849 Interstitial pulmonary disease, unspecified: Secondary | ICD-10-CM

## 2019-06-19 DIAGNOSIS — D649 Anemia, unspecified: Principal | ICD-10-CM

## 2019-06-19 NOTE — Progress Notes (Signed)
Pulmonary Individual Treatment Plan  Patient Details  Name: Louis [name] MRN: [id number] Date of Birth: [birth year] Referring Provider:     Pulmonary Rehab from 04/28/2019 in [facility] Cardiac and Pulmonary Rehab  Referring Provider  Ramaswamy      Initial Encounter Date:    Pulmonary Rehab from 04/28/2019 in [facility] Cardiac and Pulmonary Rehab  Date  04/28/19      Visit Diagnosis: ILD (interstitial lung disease) ([place])  Patient's Home Medications on Admission:  Current Outpatient Medications:  .  amLODipine (NORVASC) 10 MG tablet, Take 1 tablet (10 mg total) by mouth daily. (Patient not taking: Reported on 06/13/2019), Disp: 30 tablet, Rfl: 0 .  AURYXIA 1 GM 210 MG(Fe) tablet, Take 1 tablet by mouth 4 (four) times daily -  before meals and at bedtime., Disp: , Rfl:  .  B Complex-C-Folic Acid (RENA-VITE RX) 1 MG TABS, Take 1 tablet by mouth daily., Disp: , Rfl:  .  budesonide-formoterol (SYMBICORT) 80-4.5 MCG/ACT inhaler, Inhale 2 puffs into the lungs 2 (two) times daily., Disp: 1 Inhaler, Rfl: 12 .  carvedilol (COREG) 25 MG tablet, Take 1 tablet (25 mg total) by mouth 2 (two) times daily., Disp: 180 tablet, Rfl: 3 .  cetirizine (ZYRTEC) 10 MG tablet, Take 1 tablet (10 mg total) by mouth daily., Disp: 30 tablet, Rfl: 11 .  cloNIDine (CATAPRES - DOSED IN MG/24 HR) 0.2 mg/24hr patch, Place 1 patch (0.2 mg total) onto the skin once a week. (Patient not taking: Reported on 06/13/2019), Disp: 4 patch, Rfl: 12 .  DESCOVY 200-25 MG tablet, Take 1 tablet by mouth daily., Disp: , Rfl:  .  diclofenac sodium (VOLTAREN) 1 % GEL, Apply 2 g topically 4 (four) times daily., Disp: 100 g, Rfl: 0 .  doxycycline (VIBRAMYCIN) 100 MG capsule, Take 1 capsule (100 mg total) by mouth 2 (two) times daily for 10 days., Disp: 20 capsule, Rfl: 0 .  Febuxostat 80 MG TABS, Take 1 tablet by mouth daily., Disp: , Rfl:  .  ferrous sulfate 325 (65 FE) MG tablet, Take 1 tablet by mouth daily., Disp: , Rfl:  .  fluticasone  (FLONASE) 50 MCG/ACT nasal spray, Place into the nose., Disp: , Rfl:  .  furosemide (LASIX) 40 MG tablet, Take 80 mg by mouth daily. , Disp: , Rfl:  .  halobetasol (ULTRAVATE) 0.05 % cream, APPLY TO AFFECTED AREA TWICE A DAY, Disp: , Rfl:  .  hydrALAZINE (APRESOLINE) 100 MG tablet, Take 150 mg by mouth 3 (three) times daily., Disp: , Rfl:  .  methocarbamol (ROBAXIN) 500 MG tablet, Take 500 mg by mouth 2 (two) times daily as needed. for pain, Disp: , Rfl:  .  predniSONE (DELTASONE) 10 MG tablet, Take 1 tablet (10 mg total) by mouth 2 (two) times daily as needed. Prn gout attack for 2 days, Disp: 10 tablet, Rfl: 0 .  SPIRIVA RESPIMAT 1.25 MCG/ACT AERS, INHALE 2 PUFFS BY MOUTH INTO THE LUNGS DAILY, Disp: 4 g, Rfl: 3 .  TIVICAY 50 MG tablet, Take 1 tablet by mouth daily., Disp: , Rfl:  .  valACYclovir (VALTREX) 500 MG tablet, Take 500 mg by mouth daily as needed (for flare ups). , Disp: , Rfl:   Past Medical History: Past Medical History:  Diagnosis Date  . Chronic kidney disease    esrd  . Dermatophytosis of foot 06/23/2004  . Environmental allergies   . Gout   . HIV infection ([place]) 12/1995  . Hypertension   . Seborrhea 06/23/2004  Tobacco Use: Social History   Tobacco Use  Smoking Status Never Smoker  Smokeless Tobacco Never Used    Labs: Recent Review Flowsheet Data    Labs for ITP Cardiac and Pulmonary Rehab Latest Ref Rng & Units 06/09/2010 08/09/2011 08/23/2016   Cholestrol 0 - 200 mg/dL 186 179 -   LDLCALC 0 - 99 mg/dL 117(H) 107(H) -   HDL >39 mg/dL 55 54 -   Trlycerides <150 mg/dL 69 89 -   Hemoglobin A1c 4.8 - 5.6 % - - 5.1       Pulmonary Assessment Scores: Pulmonary Assessment Scores    Row Name 04/28/19 1634         ADL UCSD   SOB Score total  25     Rest  0     Walk  0     Stairs  2     Bath  0     Dress  0     Shop  2       CAT Score   CAT Score  16       mMRC Score   mMRC Score  0        UCSD: Self-administered rating of dyspnea associated  with activities of daily living (ADLs) 6-point scale (0 = "not at all" to 5 = "maximal or unable to do because of breathlessness")  Scoring Scores range from 0 to 120.  Minimally important difference is 5 units  CAT: CAT can identify the health impairment of COPD patients and is better correlated with disease progression.  CAT has a scoring range of zero to 40. The CAT score is classified into four groups of low (less than 10), medium (10 - 20), high (21-30) and very high (31-40) based on the impact level of disease on health status. A CAT score over 10 suggests significant symptoms.  A worsening CAT score could be explained by an exacerbation, poor medication adherence, poor inhaler technique, or progression of COPD or comorbid conditions.  CAT MCID is 2 points  mMRC: mMRC (Modified Medical Research Council) Dyspnea Scale is used to assess the degree of baseline functional disability in patients of respiratory disease due to dyspnea. No minimal important difference is established. A decrease in score of 1 point or greater is considered a positive change.   Pulmonary Function Assessment:   Exercise Target Goals: Exercise Program Goal: Individual exercise prescription set using results from initial 6 min walk test and THRR while considering  patient's activity barriers and safety.   Exercise Prescription Goal: Initial exercise prescription builds to 30-45 minutes a day of aerobic activity, 2-3 days per week.  Home exercise guidelines will be given to patient during program as part of exercise prescription that the participant will acknowledge.  Education: Aerobic Exercise & Resistance Training: - Gives group verbal and written instruction on the various components of exercise. Focuses on aerobic and resistive training programs and the benefits of this training and how to safely progress through these programs..   Education: Exercise & Equipment Safety: - Individual verbal instruction and  demonstration of equipment use and safety with use of the equipment.   Pulmonary Rehab from 04/28/2019 in [facility] Cardiac and Pulmonary Rehab  Date  04/28/19  Educator  AS  Instruction Review Code  1- Verbalizes Understanding      Education: Exercise Physiology & General Exercise Guidelines: - Group verbal and written instruction with models to review the exercise physiology of the cardiovascular system and associated critical values. Provides  general exercise guidelines with specific guidelines to those with heart or lung disease.    Education: Flexibility, Balance, Mind/Body Relaxation: Provides group verbal/written instruction on the benefits of flexibility and balance training, including mind/body exercise modes such as yoga, pilates and tai chi.  Demonstration and skill practice provided.   Activity Barriers & Risk Stratification: Activity Barriers & Cardiac Risk Stratification - 04/22/19 1548      Activity Barriers & Cardiac Risk Stratification   Activity Barriers  Shortness of Breath   USed to be able to run a 4 min mile- 15 years ago.  Now hard to walk down driveway      6 Minute Walk: 6 Minute Walk    Row Name 04/28/19 1624         6 Minute Walk   Phase  Initial     Distance  1100 feet     Walk Time  6 minutes     # of Rest Breaks  0     MPH  2.08     METS  3.9     RPE  9     Perceived Dyspnea   0     VO2 Peak  13.64     Symptoms  No     Resting HR  62 bpm     Resting BP  148/84     Resting Oxygen Saturation   98 %     Exercise Oxygen Saturation  during 6 min walk  88 %     Max Ex. HR  90 bpm     Max Ex. BP  194/68     2 Minute Post BP  152/74       Interval HR   1 Minute HR  76     2 Minute HR  84     3 Minute HR  87     4 Minute HR  88     5 Minute HR  88     6 Minute HR  90     2 Minute Post HR  78     Interval Heart Rate?  Yes       Interval Oxygen   Interval Oxygen?  Yes     Baseline Oxygen Saturation %  98 %     1 Minute Oxygen Saturation %   96 %     1 Minute Liters of Oxygen  0 L     2 Minute Oxygen Saturation %  92 %     2 Minute Liters of Oxygen  0 L     3 Minute Oxygen Saturation %  91 %     3 Minute Liters of Oxygen  0 L     4 Minute Oxygen Saturation %  93 %     4 Minute Liters of Oxygen  0 L     5 Minute Oxygen Saturation %  88 %     5 Minute Liters of Oxygen  0 L     6 Minute Oxygen Saturation %  93 %     6 Minute Liters of Oxygen  0 L     2 Minute Post Oxygen Saturation %  94 %     2 Minute Post Liters of Oxygen  0 L       Oxygen Initial Assessment: Oxygen Initial Assessment - 04/22/19 1551      Home Oxygen   Home Oxygen Device  None    Sleep Oxygen Prescription  None  Home Exercise Oxygen Prescription  None    Home at Rest Exercise Oxygen Prescription  None    Compliance with Home Oxygen Use  Yes      Intervention   Short Term Goals  To learn and demonstrate proper pursed lip breathing techniques or other breathing techniques.;To learn and demonstrate proper use of respiratory medications    Long  Term Goals  Compliance with respiratory medication;Demonstrates proper use of MDI's;Exhibits proper breathing techniques, such as pursed lip breathing or other method taught during program session       Oxygen Re-Evaluation: Oxygen Re-Evaluation    Row Name 05/05/19 1556             Program Oxygen Prescription   Program Oxygen Prescription  None         Home Oxygen   Home Oxygen Device  None       Sleep Oxygen Prescription  None       Home Exercise Oxygen Prescription  None       Home at Rest Exercise Oxygen Prescription  None       Compliance with Home Oxygen Use  Yes         Goals/Expected Outcomes   Short Term Goals  To learn and demonstrate proper pursed lip breathing techniques or other breathing techniques.;To learn and demonstrate proper use of respiratory medications       Long  Term Goals  Compliance with respiratory medication;Demonstrates proper use of MDI's;Exhibits proper breathing  techniques, such as pursed lip breathing or other method taught during program session       Comments  Reviewed PLB technique with pt.  Talked about how it works and it's importance in maintaining their exercise saturations.       Goals/Expected Outcomes  Short: Become more profiecient at using PLB.   Long: Become independent at using PLB.          Oxygen Discharge (Final Oxygen Re-Evaluation): Oxygen Re-Evaluation - 05/05/19 1556      Program Oxygen Prescription   Program Oxygen Prescription  None      Home Oxygen   Home Oxygen Device  None    Sleep Oxygen Prescription  None    Home Exercise Oxygen Prescription  None    Home at Rest Exercise Oxygen Prescription  None    Compliance with Home Oxygen Use  Yes      Goals/Expected Outcomes   Short Term Goals  To learn and demonstrate proper pursed lip breathing techniques or other breathing techniques.;To learn and demonstrate proper use of respiratory medications    Long  Term Goals  Compliance with respiratory medication;Demonstrates proper use of MDI's;Exhibits proper breathing techniques, such as pursed lip breathing or other method taught during program session    Comments  Reviewed PLB technique with pt.  Talked about how it works and it's importance in maintaining their exercise saturations.    Goals/Expected Outcomes  Short: Become more profiecient at using PLB.   Long: Become independent at using PLB.       Initial Exercise Prescription: Initial Exercise Prescription - 04/28/19 1600      Date of Initial Exercise RX and Referring Provider   Date  04/28/19    Referring Provider  Ramaswamy      Treadmill   MPH  2    Grade  3    Minutes  15    METs  3.36      Recumbant Bike   Level  3  RPM  60    Watts  35    Minutes  15    METs  3.3      Arm Ergometer   Level  1    RPM  30    Minutes  15    METs  3.3      REL-XR   Level  4    Speed  50    Minutes  15    METs  3.36      T5 Nustep   Level  2    SPM  80     Minutes  15    METs  3.3      Prescription Details   Frequency (times per week)  3    Duration  Progress to 30 minutes of continuous aerobic without signs/symptoms of physical distress      Intensity   THRR 40-80% of Max Heartrate  102-143    Ratings of Perceived Exertion  11-15    Perceived Dyspnea  0-4      Resistance Training   Training Prescription  Yes    Weight   5lb    Reps  10-15       Perform Capillary Blood Glucose checks as needed.  Exercise Prescription Changes: Exercise Prescription Changes    Row Name 04/28/19 1600 05/14/19 1300           Response to Exercise   Blood Pressure (Admit)  148/84  126/74      Blood Pressure (Exercise)  194/68  140/72      Blood Pressure (Exit)  152/74  140/82      Heart Rate (Admit)  62 bpm  76 bpm      Heart Rate (Exercise)  90 bpm  88 bpm      Heart Rate (Exit)  78 bpm  78 bpm      Oxygen Saturation (Admit)  98 %  97 %      Oxygen Saturation (Exercise)  88 %  95 %      Oxygen Saturation (Exit)  94 %  98 %      Rating of Perceived Exertion (Exercise)  9  11      Perceived Dyspnea (Exercise)  0  1      Symptoms  none  fatigue      Comments  --  second full day of exercise      Duration  --  Progress to 30 minutes of  aerobic without signs/symptoms of physical distress      Intensity  --  THRR unchanged        Progression   Progression  --  Continue to progress workloads to maintain intensity without signs/symptoms of physical distress.      Average METs  --  2.24        Resistance Training   Training Prescription  --  Yes      Weight  --   5lb      Reps  --  10-15        Interval Training   Interval Training  --  No        Treadmill   MPH  --  1.6      Grade  --  0      Minutes  --  15      METs  --  2.23        Recumbant Bike   Level  --  3      Watts  --  12      Minutes  --  15      METs  --  2.39        Arm Ergometer   Level  --  1      Minutes  --  15      METs  --  2.1         Exercise  Comments:   Exercise Goals and Review: Exercise Goals    Row Name 04/28/19 1632             Exercise Goals   Increase Physical Activity  Yes       Intervention  Provide advice, education, support and counseling about physical activity/exercise needs.;Develop an individualized exercise prescription for aerobic and resistive training based on initial evaluation findings, risk stratification, comorbidities and participant's personal goals.       Expected Outcomes  Short Term: Attend rehab on a regular basis to increase amount of physical activity.;Long Term: Add in home exercise to make exercise part of routine and to increase amount of physical activity.;Long Term: Exercising regularly at least 3-5 days a week.       Increase Strength and Stamina  Yes       Intervention  Provide advice, education, support and counseling about physical activity/exercise needs.;Develop an individualized exercise prescription for aerobic and resistive training based on initial evaluation findings, risk stratification, comorbidities and participant's personal goals.       Expected Outcomes  Short Term: Increase workloads from initial exercise prescription for resistance, speed, and METs.;Short Term: Perform resistance training exercises routinely during rehab and add in resistance training at home;Long Term: Improve cardiorespiratory fitness, muscular endurance and strength as measured by increased METs and functional capacity (6MWT)       Able to understand and use rate of perceived exertion (RPE) scale  Yes       Intervention  Provide education and explanation on how to use RPE scale       Expected Outcomes  Short Term: Able to use RPE daily in rehab to express subjective intensity level;Long Term:  Able to use RPE to guide intensity level when exercising independently       Able to understand and use Dyspnea scale  Yes       Intervention  Provide education and explanation on how to use Dyspnea scale        Expected Outcomes  Short Term: Able to use Dyspnea scale daily in rehab to express subjective sense of shortness of breath during exertion;Long Term: Able to use Dyspnea scale to guide intensity level when exercising independently       Knowledge and understanding of Target Heart Rate Range (THRR)  Yes       Intervention  Provide education and explanation of THRR including how the numbers were predicted and where they are located for reference       Expected Outcomes  Short Term: Able to state/look up THRR;Short Term: Able to use daily as guideline for intensity in rehab;Long Term: Able to use THRR to govern intensity when exercising independently       Able to check pulse independently  Yes       Intervention  Provide education and demonstration on how to check pulse in carotid and radial arteries.;Review the importance of being able to check your own pulse for safety during independent exercise       Expected Outcomes  Short Term: Able to explain why pulse checking is important during independent  exercise;Long Term: Able to check pulse independently and accurately       Understanding of Exercise Prescription  Yes       Intervention  Provide education, explanation, and written materials on patient's individual exercise prescription       Expected Outcomes  Short Term: Able to explain program exercise prescription;Long Term: Able to explain home exercise prescription to exercise independently          Exercise Goals Re-Evaluation : Exercise Goals Re-Evaluation    Row Name 05/05/19 1555 05/14/19 1325 05/28/19 1548         Exercise Goal Re-Evaluation   Exercise Goals Review  Increase Physical Activity;Able to understand and use rate of perceived exertion (RPE) scale;Knowledge and understanding of Target Heart Rate Range (THRR);Understanding of Exercise Prescription;Increase Strength and Stamina;Able to understand and use Dyspnea scale;Able to check pulse independently  Increase Physical  Activity;Increase Strength and Stamina;Understanding of Exercise Prescription  --     Comments  Reviewed RPE scale, THR and program prescription with pt today.  Pt voiced understanding and was given a copy of goals to take home.  Louis [name] is off to a good start in rehab.  He has completed his first two full days of exercise.  We will continue to monitor his progress.  Out since last review (no visits this month)     Expected Outcomes  Short: Use RPE daily to regulate intensity. Long: Follow program prescription in THR.  Short: Continue to attend regularly  Long: Continue to follow program prescription.  --        Discharge Exercise Prescription (Final Exercise Prescription Changes): Exercise Prescription Changes - 05/14/19 1300      Response to Exercise   Blood Pressure (Admit)  126/74    Blood Pressure (Exercise)  140/72    Blood Pressure (Exit)  140/82    Heart Rate (Admit)  76 bpm    Heart Rate (Exercise)  88 bpm    Heart Rate (Exit)  78 bpm    Oxygen Saturation (Admit)  97 %    Oxygen Saturation (Exercise)  95 %    Oxygen Saturation (Exit)  98 %    Rating of Perceived Exertion (Exercise)  11    Perceived Dyspnea (Exercise)  1    Symptoms  fatigue    Comments  second full day of exercise    Duration  Progress to 30 minutes of  aerobic without signs/symptoms of physical distress    Intensity  THRR unchanged      Progression   Progression  Continue to progress workloads to maintain intensity without signs/symptoms of physical distress.    Average METs  2.24      Resistance Training   Training Prescription  Yes    Weight   5lb    Reps  10-15      Interval Training   Interval Training  No      Treadmill   MPH  1.6    Grade  0    Minutes  15    METs  2.23      Recumbant Bike   Level  3    Watts  12    Minutes  15    METs  2.39      Arm Ergometer   Level  1    Minutes  15    METs  2.1       Nutrition:  Target Goals: Understanding of nutrition guidelines, daily  intake of sodium '1500mg'$ , cholesterol '200mg'$ ,  calories 30% from fat and 7% or less from saturated fats, daily to have 5 or more servings of fruits and vegetables.  Education: Controlling Sodium/Reading Food Labels -Group verbal and written material supporting the discussion of sodium use in heart healthy nutrition. Review and explanation with models, verbal and written materials for utilization of the food label.   Education: General Nutrition Guidelines/Fats and Fiber: -Group instruction provided by verbal, written material, models and posters to present the general guidelines for heart healthy nutrition. Gives an explanation and review of dietary fats and fiber.   Biometrics: Pre Biometrics - 04/28/19 1636      Pre Biometrics   Height  6' 0.5" (1.842 m)    Weight  180 lb 14.4 oz (82.1 kg)    BMI (Calculated)  24.18        Nutrition Therapy Plan and Nutrition Goals:   Nutrition Assessments: Nutrition Assessments - 04/28/19 1635      MEDFICTS Scores   Pre Score  44       MEDIFICTS Score Key:          ?70 Need to make dietary changes          40-70 Heart Healthy Diet         ? 40 Therapeutic Level Cholesterol Diet  Nutrition Goals Re-Evaluation:   Nutrition Goals Discharge (Final Nutrition Goals Re-Evaluation):   Psychosocial: Target Goals: Acknowledge presence or absence of significant depression and/or stress, maximize coping skills, provide positive support system. Participant is able to verbalize types and ability to use techniques and skills needed for reducing stress and depression.   Education: Depression - Provides group verbal and written instruction on the correlation between heart/lung disease and depressed mood, treatment options, and the stigmas associated with seeking treatment.   Education: Sleep Hygiene -Provides group verbal and written instruction about how sleep can affect your health.  Define sleep hygiene, discuss sleep cycles and impact of sleep  habits. Review good sleep hygiene tips.    Education: Stress and Anxiety: - Provides group verbal and written instruction about the health risks of elevated stress and causes of high stress.  Discuss the correlation between heart/lung disease and anxiety and treatment options. Review healthy ways to manage with stress and anxiety.   Initial Review & Psychosocial Screening: Initial Psych Review & Screening - 04/22/19 1552      Initial Review   Current issues with  None Identified      Family Dynamics   Good Support System?  Yes   Mom and Aunt .     Barriers   Psychosocial barriers to participate in program  There are no identifiable barriers or psychosocial needs.;The patient should benefit from training in stress management and relaxation.      Screening Interventions   Interventions  Encouraged to exercise;To provide support and resources with identified psychosocial needs;Provide feedback about the scores to participant    Expected Outcomes  Short Term goal: Utilizing psychosocial counselor, staff and physician to assist with identification of specific Stressors or current issues interfering with healing process. Setting desired goal for each stressor or current issue identified.;Long Term Goal: Stressors or current issues are controlled or eliminated.;Short Term goal: Identification and review with participant of any Quality of Life or Depression concerns found by scoring the questionnaire.;Long Term goal: The participant improves quality of Life and PHQ9 Scores as seen by post scores and/or verbalization of changes       Quality of Life Scores:  Scores of 19  and below usually indicate a poorer quality of life in these areas.  A difference of  2-3 points is a clinically meaningful difference.  A difference of 2-3 points in the total score of the Quality of Life Index has been associated with significant improvement in overall quality of life, self-image, physical symptoms, and general  health in studies assessing change in quality of life.  PHQ-9: Recent Review Flowsheet Data    Depression screen [facility] 2/9 04/28/2019 04/21/2019 01/21/2019 09/05/2018 08/19/2018   Decreased Interest 0 0 0 0 0   Down, Depressed, Hopeless 0 0 0 0 0   PHQ - 2 Score 0 0 0 0 0   Altered sleeping 1 1 0 0 3   Tired, decreased energy 1 2 0 0 1   Change in appetite 0 0 0 0 1   Feeling bad or failure about yourself  0 0 0 0 0   Trouble concentrating 0 0 0 0 0   Moving slowly or fidgety/restless 0 0 0 0 0   Suicidal thoughts 0 0 0 0 0   PHQ-9 Score 2 3 0 0 5   Difficult doing work/chores Not difficult at all Not difficult at all - Not difficult at all Not difficult at all     Interpretation of Total Score  Total Score Depression Severity:  1-4 = Minimal depression, 5-9 = Mild depression, 10-14 = Moderate depression, 15-19 = Moderately severe depression, 20-27 = Severe depression   Psychosocial Evaluation and Intervention: Psychosocial Evaluation - 04/22/19 1607      Psychosocial Evaluation & Interventions   Interventions  Encouraged to exercise with the program and follow exercise prescription    Comments  Louis [name] has no barriers to attending the Pulmonary Rehab program. He is in oprocess of being on Kidney Transplant list and understands this program can help him be more physically prepared when he is called for a transplant. He lives with a roommate and has a good support system with his Mom and Elenor Legato. He is learning a home dialysis process so he can do his own dialysuis and save time not being required to go to a dialysis facility. He should do well with the program.    Expected Outcomes  STG: Louis [name] is able to schedule and attend at least 2 sessions a week, hoping to be 3 sessions a week.  LTG: Louis [name] has the tools and resources when he graduates to continue to fight his lung disease and complete his ADLs without tooo much effort.  He is able to walk further than his driveway before getting SOB.    Continue  Psychosocial Services   Follow up required by staff       Psychosocial Re-Evaluation:   Psychosocial Discharge (Final Psychosocial Re-Evaluation):   Education: Education Goals: Education classes will be provided on a weekly basis, covering required topics. Participant will state understanding/return demonstration of topics presented.  Learning Barriers/Preferences: Learning Barriers/Preferences - 04/22/19 1554      Learning Barriers/Preferences   Learning Barriers  None    Learning Preferences  None       General Pulmonary Education Topics:  Infection Prevention: - Provides verbal and written material to individual with discussion of infection control including proper hand washing and proper equipment cleaning during exercise session.   Pulmonary Rehab from 04/28/2019 in [facility] Cardiac and Pulmonary Rehab  Date  04/28/19  Educator  AS  Instruction Review Code  1- Verbalizes Understanding      Falls Prevention: - Provides  verbal and written material to individual with discussion of falls prevention and safety.   Pulmonary Rehab from 04/28/2019 in [facility] Cardiac and Pulmonary Rehab  Date  04/28/19  Educator  AS  Instruction Review Code  1- Verbalizes Understanding      Chronic Lung Diseases: - Group verbal and written instruction to review updates, respiratory medications, advancements in procedures and treatments. Discuss use of supplemental oxygen including available portable oxygen systems, continuous and intermittent flow rates, concentrators, personal use and safety guidelines. Review proper use of inhaler and spacers. Provide informative websites for self-education.    Energy Conservation: - Provide group verbal and written instruction for methods to conserve energy, plan and organize activities. Instruct on pacing techniques, use of adaptive equipment and posture/positioning to relieve shortness of breath.   Triggers and Exacerbations: - Group verbal and written  instruction to review types of environmental triggers and ways to prevent exacerbations. Discuss weather changes, air quality and the benefits of nasal washing. Review warning signs and symptoms to help prevent infections. Discuss techniques for effective airway clearance, coughing, and vibrations.   AED/CPR: - Group verbal and written instruction with the use of models to demonstrate the basic use of the AED with the basic ABC's of resuscitation.   Anatomy and Physiology of the Lungs: - Group verbal and written instruction with the use of models to provide basic lung anatomy and physiology related to function, structure and complications of lung disease.   Anatomy & Physiology of the Heart: - Group verbal and written instruction and models provide basic cardiac anatomy and physiology, with the coronary electrical and arterial systems. Review of Valvular disease and Heart Failure   Cardiac Medications: - Group verbal and written instruction to review commonly prescribed medications for heart disease. Reviews the medication, class of the drug, and side effects.   Other: -Provides group and verbal instruction on various topics (see comments)   Knowledge Questionnaire Score: Knowledge Questionnaire Score - 04/28/19 1633      Knowledge Questionnaire Score   Pre Score  15/18        Core Components/Risk Factors/Patient Goals at Admission: Personal Goals and Risk Factors at Admission - 04/28/19 1640      Core Components/Risk Factors/Patient Goals on Admission    Weight Management  Weight Gain;Yes    Intervention  Weight Management: Develop a combined nutrition and exercise program designed to reach desired caloric intake, while maintaining appropriate intake of nutrient and fiber, sodium and fats, and appropriate energy expenditure required for the weight goal.;Weight Management: Provide education and appropriate resources to help participant work on and attain dietary goals.    Admit  Weight  180 lb 14.4 oz (82.1 kg)    Goal Weight: Short Term  190 lb (86.2 kg)    Goal Weight: Long Term  210 lb (95.3 kg)    Expected Outcomes  Short Term: Continue to assess and modify interventions until short term weight is achieved;Long Term: Adherence to nutrition and physical activity/exercise program aimed toward attainment of established weight goal;Weight Gain: Understanding of general recommendations for a high calorie, high protein meal plan that promotes weight gain by distributing calorie intake throughout the day with the consumption for 4-5 meals, snacks, and/or supplements    Heart Failure  Yes    Intervention  Provide a combined exercise and nutrition program that is supplemented with education, support and counseling about heart failure. Directed toward relieving symptoms such as shortness of breath, decreased exercise tolerance, and extremity edema.  Expected Outcomes  Improve functional capacity of life;Short term: Attendance in program 2-3 days a week with increased exercise capacity. Reported lower sodium intake. Reported increased fruit and vegetable intake. Reports medication compliance.;Short term: Daily weights obtained and reported for increase. Utilizing diuretic protocols set by physician.;Long term: Adoption of self-care skills and reduction of barriers for early signs and symptoms recognition and intervention leading to self-care maintenance.       Education:Diabetes - Individual verbal and written instruction to review signs/symptoms of diabetes, desired ranges of glucose level fasting, after meals and with exercise. Acknowledge that pre and post exercise glucose checks will be done for 3 sessions at entry of program.   Education: Know Your Numbers and Risk Factors: -Group verbal and written instruction about important numbers in your health.  Discussion of what are risk factors and how they play a role in the disease process.  Review of Cholesterol, Blood Pressure,  Diabetes, and BMI and the role they play in your overall health.   Core Components/Risk Factors/Patient Goals Review:    Core Components/Risk Factors/Patient Goals at Discharge (Final Review):    ITP Comments: ITP Comments    Row Name 04/22/19 1605 05/05/19 1554 05/07/19 1020 05/28/19 1545 05/28/19 1547   ITP Comments  Virtual Orientation completed today. Appointment for EP eval and gym orientation set for 3/15 at 3 PM. Documentation of diagnosis can be found in [facility] 03/21/2019.  First full day of exercise!  Patient was oriented to gym and equipment including functions, settings, policies, and procedures.  Patient's individual exercise prescription and treatment plan were reviewed.  All starting workloads were established based on the results of the 6 minute walk test done at initial orientation visit.  The plan for exercise progression was also introduced and progression will be customized based on patient's performance and goals.  30 day chart review completed. ITP sent to Dr Zachery Dakins Medical Director, for review,changes as needed and signature. New to program  Out for month  Called to check in on pt as pt has not been to rehab since the end of march. Pt reports tooth has been bothering him and he is getting it pulled the 19th, will f/u with pt for return to rehab.   [place] Name 06/04/19 0631 06/09/19 1549 06/19/19 1511       ITP Comments  30 Day review completed. Medical Director review done, changes made as directed,and approval shown by signature of Market researcher.  Louis [name] has not attended since last review  Louis [name] has been struggling with anemia and a lung nodule. They are trying to figure out the cause of his anemia and took a biopsy of the nodule. Discharging for now, MD can refer again once stable.        Comments: Discharge ITP

## 2019-06-19 NOTE — Telephone Encounter (Signed)
Called and spoke with Patient. Patient stated he was suppose to be given a cough medication at last OV, but patient stated wrong cough was given. Patient stated he is still having a nagging cough, that is mostly dry, but is having occasional clear phlegm.  Patient is requestig promethazine codeine. Patient stated that seemed to help cough before. Patient requested medication be sent to [place].  Message routed to Dr. Chase Caller  LOV 06/13/19 with MR-   Instructions    ICD-10-CM   1. Acute bronchitis with COPD ([place])  J44.0    J20.9   2. ILD (interstitial lung disease) ([place])  J84.9   3. Pulmonary emphysema, unspecified emphysema type ([place])  J43.9   4. Dyspnea on exertion  R06.00   5. Nodule of lower lobe of right lung  R91.1    Acute bronchhitis   - extend doxycycline 100mg  bid x another 10 days per your personal request   ILD (interstitial lung disease) ([place])  -Idiopathic interstitial ILD. - seems persistent on PFT  Plan - rpeat HRCT supine and prone, inspiration and expiration  - next 1 week - based on above - reconsider biopsy decision - might be ok to to bronchoscopy with BAL and envisia biopsy  V surgical lung bx (Mom prefers surgical biopsy)   Pulmonary emphysema, unspecified emphysema type ([place])  No improvement in dyspnea despite starting Spiriva  Plan  -For now continue Spiriva - continue symbicort 2 puff twice daily  Dyspnea is multifactorial  Plan  -Glad you are going pulmonary rehabilitation   Right-sided pulmonary nodule 9 mm January 2021  Plan  -will capture in high-resolution CT chest   Anemia - worse/new   - refer GI at your mom's and your request   Follow-up  - < 1 week with Dr Chase Caller in  Telephone/video visit to discuss CT results

## 2019-06-20 MED ORDER — ONDANSETRON 4 MG DISINTEGRATING TABLET
ORAL_TABLET | Freq: Three times a day (TID) | ORAL | 0 refills | 10 days | Status: CP | PRN
Start: 2019-06-20 — End: 2019-06-25

## 2019-06-21 DIAGNOSIS — Z992 Dependence on renal dialysis: Secondary | ICD-10-CM | POA: Diagnosis not present

## 2019-06-21 DIAGNOSIS — D631 Anemia in chronic kidney disease: Secondary | ICD-10-CM | POA: Diagnosis not present

## 2019-06-21 DIAGNOSIS — N186 End stage renal disease: Secondary | ICD-10-CM | POA: Diagnosis not present

## 2019-06-22 ENCOUNTER — Other Ambulatory Visit: Payer: Self-pay | Admitting: Internal Medicine

## 2019-06-23 ENCOUNTER — Telehealth: Payer: Self-pay | Admitting: Internal Medicine

## 2019-06-23 DIAGNOSIS — D631 Anemia in chronic kidney disease: Secondary | ICD-10-CM | POA: Diagnosis not present

## 2019-06-23 DIAGNOSIS — N186 End stage renal disease: Secondary | ICD-10-CM | POA: Diagnosis not present

## 2019-06-23 DIAGNOSIS — Z992 Dependence on renal dialysis: Secondary | ICD-10-CM | POA: Diagnosis not present

## 2019-06-23 DIAGNOSIS — J449 Chronic obstructive pulmonary disease, unspecified: Secondary | ICD-10-CM | POA: Diagnosis not present

## 2019-06-23 NOTE — Telephone Encounter (Signed)
He specifically asked for extension doxy when he was there (his mom was there too)  Plan  - can you please find out from him or his pharmacy when  (promethazine 6.25 mg/codeine 10 mg)  Cough syrup was prescribed to him in last 2  Years and who the prescribers were   Thanks    SIGNATURE    Dr. Brand Males, M.D., F.C.C.P,  Pulmonary and Critical Care Medicine Staff Physician, [place] Director - Interstitial Lung Disease  Program  Pulmonary [place] at [place], [place], [address]  Pager: [phone], If no answer or between  15:00h - 7:00h: call [phone] Telephone: [phone]  7:46 AM 06/23/2019

## 2019-06-23 NOTE — Telephone Encounter (Signed)
Called [facility] in [place]. Per their records, patient was prescribed promethazine with codeine back on 03/11/2019 and April 2020 at another [place].

## 2019-06-23 NOTE — Telephone Encounter (Signed)
LMTCB

## 2019-06-24 NOTE — Telephone Encounter (Signed)
Duplicate encounters.  Will close this encounter.

## 2019-06-24 NOTE — Telephone Encounter (Signed)
I just sent another phone note about his cough syrup request  Plan -Once he has his high-resolution CT chest tomorrow then based on that I can make decisions on his cough syrup and is out of work note

## 2019-06-24 NOTE — Telephone Encounter (Signed)
Spoke with pt. He is needing a work note to continue being out of work. Pt does not feel like he can go back to work right now.  Dr. Chase Caller - please advise. Thanks.

## 2019-06-24 NOTE — Telephone Encounter (Signed)
I like to see what his CT chet shows tomorrow and then get back to him about cough s yrup. Pleae let him know and send message back to me

## 2019-06-24 NOTE — Telephone Encounter (Signed)
lmtcb for pt.  

## 2019-06-25 ENCOUNTER — Ambulatory Visit
Admission: RE | Admit: 2019-06-25 | Discharge: 2019-06-25 | Disposition: A | Discharge status: Home / Self Care | Payer: PRIVATE HEALTH INSURANCE | Source: Ambulatory Visit | Attending: Internal Medicine | Admitting: Internal Medicine

## 2019-06-25 ENCOUNTER — Telehealth: Payer: Self-pay | Admitting: Internal Medicine

## 2019-06-25 ENCOUNTER — Other Ambulatory Visit: Payer: Self-pay

## 2019-06-25 DIAGNOSIS — D631 Anemia in chronic kidney disease: Secondary | ICD-10-CM | POA: Diagnosis not present

## 2019-06-25 DIAGNOSIS — N186 End stage renal disease: Secondary | ICD-10-CM | POA: Diagnosis not present

## 2019-06-25 DIAGNOSIS — J849 Interstitial pulmonary disease, unspecified: Secondary | ICD-10-CM | POA: Diagnosis not present

## 2019-06-25 DIAGNOSIS — Z992 Dependence on renal dialysis: Secondary | ICD-10-CM | POA: Diagnosis not present

## 2019-06-25 NOTE — Telephone Encounter (Signed)
Called and spoke with patient to let him know about Dr. Orvil Feil recs about wanting to wait to get CT results back first. Patient expressed understanding. Will route back to Dr. Chase Caller as requested

## 2019-06-25 NOTE — Telephone Encounter (Signed)
Called and spoke with patient in regards to a different message from Dr. Chase Caller about cough syrup.  Dr. Chase Caller patient is requesting a work note. Please advise if you are ok with this being written and for how long.

## 2019-06-26 ENCOUNTER — Telehealth: Payer: Self-pay | Admitting: Internal Medicine

## 2019-06-26 ENCOUNTER — Encounter: Payer: Self-pay | Admitting: Adult Health

## 2019-06-26 ENCOUNTER — Ambulatory Visit (INDEPENDENT_AMBULATORY_CARE_PROVIDER_SITE_OTHER): Payer: Medicare Other | Admitting: Adult Health

## 2019-06-26 DIAGNOSIS — D631 Anemia in chronic kidney disease: Secondary | ICD-10-CM | POA: Diagnosis not present

## 2019-06-26 DIAGNOSIS — J439 Emphysema, unspecified: Secondary | ICD-10-CM | POA: Diagnosis not present

## 2019-06-26 DIAGNOSIS — J849 Interstitial pulmonary disease, unspecified: Secondary | ICD-10-CM

## 2019-06-26 DIAGNOSIS — R0602 Shortness of breath: Secondary | ICD-10-CM

## 2019-06-26 DIAGNOSIS — R059 Cough, unspecified: Secondary | ICD-10-CM

## 2019-06-26 DIAGNOSIS — R0600 Dyspnea, unspecified: Secondary | ICD-10-CM | POA: Diagnosis not present

## 2019-06-26 DIAGNOSIS — Z992 Dependence on renal dialysis: Secondary | ICD-10-CM | POA: Diagnosis not present

## 2019-06-26 DIAGNOSIS — N186 End stage renal disease: Secondary | ICD-10-CM | POA: Diagnosis not present

## 2019-06-26 DIAGNOSIS — R05 Cough: Secondary | ICD-10-CM | POA: Diagnosis not present

## 2019-06-26 NOTE — Telephone Encounter (Signed)
Spoke with the pt  He is c/o worsening SOB over the past 2 wks- mainly occurs when he lies down  No swelling- he tries his best to keep this under control and states has not been an issue recently  He has noticed some cough with pinkish/red sputum over the past few days  He denies any wheezing, fever, body aches  He had ct chest yesterday  I have scheduled him appt with Aaron Edelman for today 06/26/19 at 12 noon  Seek emergent care sooner if needed

## 2019-06-26 NOTE — Telephone Encounter (Signed)
He was advised to go to ER Today. Will address at next scheduled OV. His CT showed enlarged pulmonary artery -> have written to his cardiologist to arrange for heart cath. He does not have ILD

## 2019-06-26 NOTE — Progress Notes (Signed)
Virtual Visit via Telephone Note  I connected with Louis [name] on 06/26/19 at 12:00 PM EDT by telephone and verified that I am speaking with the correct person using two identifiers.  Location: Patient: Home  Provider:Home    I discussed the limitations, risks, security and privacy concerns of performing an evaluation and management service by telephone and the availability of in person appointments. I also discussed with the patient that there may be a patient responsible charge related to this service. The patient expressed understanding and agreed to proceed.   History of Present Illness: 59 year old male former smoker with multiple medical comorbidities including end-stage renal disease on dialysis Monday Wednesday Friday, congestive heart failure, severe chronic anemia, HIV  Today's televisit is an acute office visit.  Patient complains that his breathing continues to progressively worsen.  He was seen in the office 2 weeks ago at that time doxycycline was extended for an additional 10 days for ongoing cough and congestion.  Patient says he is no better and continues to have increased cough and now is coughing up pink-tinged sputum.  He gets very short of breath with minimum activity and has a very low activity tolerance.  He was previously on oxygen but says he has not been wearing this because it seems to make his breathing worse.  He does not check his oxygen levels at home.  Patient says he is on dialysis Monday Wednesday and Friday has not missed any dialysis sessions. Since he developed pneumonia in he also has been having September 2020.  Says he has been getting worse but over the last few weeks it has significantly declined.  He also is battling severe anemia unclear etiology he has chronic anemia but has been getting progressively worse had to have a transfusion last week because hemoglobin was down to 5.7.  Hemoglobin recheck was 6.5 according to epic care everywhere last week.   Patient says he is very short of breath and is having hard time getting anything done due to his dyspnea.  He is unable to work.  High-resolution CT chest done yesterday showed small pericardial effusion, mild cardiomegaly, no pathologically enlarged lymph nodes, small dependent bilateral pleural effusions, numerous small air cyst scattered throughout both lungs stable right lower lobe nodule no consolidation.  Prominent patchy groundglass opacities throughout both lungs significantly increased compared to January 2021.  Considered possible cardiogenic pulmonary edema versus plus or minus atypical infection as history of HIV, tiny air cyst are unchanged and could support a diagnosis of atypical infection such as pneumocystis pneumonia.  Marked diffuse bronchial wall thickening has worsened . Patient Active Problem List   Diagnosis Date Noted  . Hypertensive urgency 03/05/2019  . Prolonged Q-T interval on ECG 06/15/2018  . Mild protein-calorie malnutrition ([place]) 05/01/2018  . Eczema 01/04/2018  . History of anemia due to CKD 05/24/2017  . ESRD on dialysis ([place]) 12/19/2016  . 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 Baker's cyst of knee, right 05/17/2016  . Neck muscle spasm 03/16/2016  . Hypertension 06/23/2010  . Rosacea 06/23/2010  . Human immunodeficiency virus (HIV) disease ([place]) 01/02/1996   Current Outpatient Medications on File Prior to Visit  Medication Sig Dispense Refill  . amLODipine (NORVASC) 10 MG tablet Take 1 tablet (10 mg total) by mouth daily. (Patient not taking: Reported on 06/13/2019) 30 tablet 0  . AURYXIA 1 GM 210 MG(Fe) tablet Take 1 tablet by mouth 4 (four) times daily -  before meals and at bedtime.    Marland Kitchen  B Complex-C-Folic Acid (RENA-VITE RX) 1 MG TABS Take 1 tablet by mouth daily.    . budesonide-formoterol (SYMBICORT) 80-4.5 MCG/ACT inhaler Inhale 2 puffs into the lungs 2 (two) times daily. 1 Inhaler 12  . carvedilol (COREG) 25 MG tablet Take 1 tablet (25  mg total) by mouth 2 (two) times daily. 180 tablet 3  . cetirizine (ZYRTEC) 10 MG tablet Take 1 tablet (10 mg total) by mouth daily. 30 tablet 11  . cloNIDine (CATAPRES - DOSED IN MG/24 HR) 0.2 mg/24hr patch Place 1 patch (0.2 mg total) onto the skin once a week. (Patient not taking: Reported on 06/13/2019) 4 patch 12  . DESCOVY 200-25 MG tablet Take 1 tablet by mouth daily.    . diclofenac sodium (VOLTAREN) 1 % GEL Apply 2 g topically 4 (four) times daily. 100 g 0  . Febuxostat 80 MG TABS Take 1 tablet by mouth daily.    . ferrous sulfate 325 (65 FE) MG tablet Take 1 tablet by mouth daily.    . fluticasone (FLONASE) 50 MCG/ACT nasal spray Place into the nose.    . furosemide (LASIX) 40 MG tablet Take 80 mg by mouth daily.     . halobetasol (ULTRAVATE) 0.05 % cream APPLY TO AFFECTED AREA TWICE A DAY    . hydrALAZINE (APRESOLINE) 100 MG tablet Take 150 mg by mouth 3 (three) times daily.    . methocarbamol (ROBAXIN) 500 MG tablet Take 500 mg by mouth 2 (two) times daily as needed. for pain    . predniSONE (DELTASONE) 10 MG tablet Take 1 tablet (10 mg total) by mouth 2 (two) times daily as needed. Prn gout attack for 2 days 10 tablet 0  . SPIRIVA RESPIMAT 1.25 MCG/ACT AERS INHALE 2 PUFFS BY MOUTH INTO THE LUNGS DAILY 4 g 3  . TIVICAY 50 MG tablet Take 1 tablet by mouth daily.    . valACYclovir (VALTREX) 500 MG tablet Take 500 mg by mouth daily as needed (for flare ups).      No current facility-administered medications on file prior to visit.     Has received both of his Covid vaccines  Observations/Objective: Autoimmune labs ANA negative, ACE 14, Rheumatoid negative C ANCA P ANCA negative  High-resolution CT chest February 18, 2019 showed spectrum of findings consistent with early or mild interstitial lung disease considered indeterminate for UIP.  9 mm right lower lobe nodule favored to represent a subpleural lymph node.  Mild emphysema  Assessment and Plan: Dyspnea cough pink-tinged mucus  patient has progressive respiratory symptoms over the last several weeks.  Recent CT chest yesterday shows possible decompensated CHF however with his multiple medical problems including symptomatic anemia requiring transfusion, end-stage renal disease on dialysis and underlying HIV places him at high risk for decompensation and this is very difficult to assess over the phone.  He has received an extensive course of antibiotics without improvement in the outpatient setting.  He is now coughing up pink-tinged sputum worried that he could have some active bleeding and or this could be pulmonary edema.  Feel that he needs to be seen in the emergency room had recommended that he go to the emergency room for evaluation.  He chooses to go to the [place] that he is closest to which is [facility] and all of his doctors are there as well.  Plan  Recommend you be evaluated in the emergency room immediately.  If unable to go safely by vehicle recommended to call 911 for transportation.  Please contact office for sooner follow up if symptoms do not improve or worsen or seek emergency care    Follow Up Instructions: Recommend go to the emergency room   I discussed the assessment and treatment plan with the patient. The patient was provided an opportunity to ask questions and all were answered. The patient agreed with the plan and demonstrated an understanding of the instructions.   The patient was advised to call back or seek an in-person evaluation if the symptoms worsen or if the condition fails to improve as anticipated.  I provided 30  minutes of non-face-to-face time during this encounter.   Rexene Edison, NP

## 2019-06-26 NOTE — Telephone Encounter (Signed)
Dear Dr Garen Lah  We just did a HRCT on Louis [name] and he also had a tele visit iwh [place]. His pulmonary infiltrates and symptoms do not seem ILD or fibrosis related. Sounded cardiac. He might be going to an ER but the CT also showe enlarged PA. Given his HIV status - this likely warrants a right heart cath due to Rx available. Wondering if you could arrange?  Thanks    SIGNATURE    Dr. Brand Males, M.D., F.C.C.P,  Pulmonary and Critical Care Medicine Staff Physician, [place] Director - Interstitial Lung Disease  Program  Pulmonary [place] at [place], [place], [address]  Pager: [phone], If no answer or between  15:00h - 7:00h: call [phone] Telephone: [phone]  6:20 PM 06/26/2019      has a past medical history of Chronic kidney disease, Dermatophytosis of foot (06/23/2004), Environmental allergies, Gout, HIV infection ([place]) (12/1995), Hypertension, and Seborrhea (06/23/2004).

## 2019-06-26 NOTE — Telephone Encounter (Signed)
He saw TP 06/26/2019 and was sent to ER. I will address this at his next scheduled visit in a week

## 2019-06-26 NOTE — Patient Instructions (Signed)
Refer to Emergency Room

## 2019-06-27 ENCOUNTER — Ambulatory Visit: Admit: 2019-06-27 | Discharge: 2019-06-29 | Payer: MEDICARE

## 2019-06-27 ENCOUNTER — Encounter: Admit: 2019-06-27 | Discharge: 2019-06-29 | Payer: MEDICARE

## 2019-06-27 DIAGNOSIS — R600 Localized edema: Secondary | ICD-10-CM | POA: Diagnosis not present

## 2019-06-27 DIAGNOSIS — Z79899 Other long term (current) drug therapy: Secondary | ICD-10-CM | POA: Diagnosis not present

## 2019-06-27 DIAGNOSIS — I5030 Unspecified diastolic (congestive) heart failure: Secondary | ICD-10-CM | POA: Diagnosis not present

## 2019-06-27 DIAGNOSIS — N186 End stage renal disease: Secondary | ICD-10-CM | POA: Diagnosis not present

## 2019-06-27 DIAGNOSIS — R531 Weakness: Secondary | ICD-10-CM | POA: Diagnosis not present

## 2019-06-27 DIAGNOSIS — I132 Hypertensive heart and chronic kidney disease with heart failure and with stage 5 chronic kidney disease, or end stage renal disease: Secondary | ICD-10-CM | POA: Diagnosis not present

## 2019-06-27 DIAGNOSIS — R0602 Shortness of breath: Secondary | ICD-10-CM | POA: Diagnosis not present

## 2019-06-27 DIAGNOSIS — Z992 Dependence on renal dialysis: Secondary | ICD-10-CM | POA: Diagnosis not present

## 2019-06-27 DIAGNOSIS — D631 Anemia in chronic kidney disease: Secondary | ICD-10-CM | POA: Diagnosis not present

## 2019-06-27 DIAGNOSIS — Z01812 Encounter for preprocedural laboratory examination: Principal | ICD-10-CM

## 2019-06-27 DIAGNOSIS — Z20822 Encounter for preprocedure screening laboratory testing for COVID-19: Principal | ICD-10-CM

## 2019-06-27 NOTE — Telephone Encounter (Signed)
Called and spoke with pt to see if he was going to the ED as advised by MR. Pt stated that he was going to go. Stated to him the other info per MR. Nothing further needed.

## 2019-06-27 NOTE — Telephone Encounter (Signed)
Noted. Nothing further needed. 

## 2019-06-27 NOTE — Telephone Encounter (Signed)
Oh ok thanks. I will d/w him when he comes for followup. I will let you know if he is willing to re-engage. Thanks a lot for reply. Reply to reply not needed

## 2019-06-28 DIAGNOSIS — I132 Hypertensive heart and chronic kidney disease with heart failure and with stage 5 chronic kidney disease, or end stage renal disease: Secondary | ICD-10-CM | POA: Diagnosis not present

## 2019-06-28 DIAGNOSIS — Z992 Dependence on renal dialysis: Secondary | ICD-10-CM | POA: Diagnosis not present

## 2019-06-28 DIAGNOSIS — J90 Pleural effusion, not elsewhere classified: Secondary | ICD-10-CM | POA: Diagnosis not present

## 2019-06-28 DIAGNOSIS — R918 Other nonspecific abnormal finding of lung field: Secondary | ICD-10-CM | POA: Diagnosis not present

## 2019-06-28 DIAGNOSIS — R0600 Dyspnea, unspecified: Secondary | ICD-10-CM | POA: Diagnosis not present

## 2019-06-28 DIAGNOSIS — D631 Anemia in chronic kidney disease: Secondary | ICD-10-CM | POA: Diagnosis not present

## 2019-06-28 DIAGNOSIS — N186 End stage renal disease: Secondary | ICD-10-CM | POA: Diagnosis not present

## 2019-06-29 DIAGNOSIS — I120 Hypertensive chronic kidney disease with stage 5 chronic kidney disease or end stage renal disease: Secondary | ICD-10-CM | POA: Diagnosis not present

## 2019-06-29 DIAGNOSIS — I340 Nonrheumatic mitral (valve) insufficiency: Secondary | ICD-10-CM | POA: Diagnosis not present

## 2019-06-29 DIAGNOSIS — R0600 Dyspnea, unspecified: Secondary | ICD-10-CM | POA: Diagnosis not present

## 2019-06-29 DIAGNOSIS — N186 End stage renal disease: Secondary | ICD-10-CM | POA: Diagnosis not present

## 2019-06-29 DIAGNOSIS — I313 Pericardial effusion (noninflammatory): Secondary | ICD-10-CM | POA: Diagnosis not present

## 2019-06-29 DIAGNOSIS — Z992 Dependence on renal dialysis: Secondary | ICD-10-CM | POA: Diagnosis not present

## 2019-06-29 DIAGNOSIS — I132 Hypertensive heart and chronic kidney disease with heart failure and with stage 5 chronic kidney disease, or end stage renal disease: Secondary | ICD-10-CM | POA: Diagnosis not present

## 2019-06-29 DIAGNOSIS — I517 Cardiomegaly: Secondary | ICD-10-CM | POA: Diagnosis not present

## 2019-06-29 MED ORDER — SEVELAMER CARBONATE 800 MG TABLET
ORAL_TABLET | Freq: Three times a day (TID) | ORAL | 0 refills | 30.00000 days | Status: CP
Start: 2019-06-29 — End: 2020-06-28

## 2019-06-30 ENCOUNTER — Telehealth: Payer: Self-pay | Admitting: Internal Medicine

## 2019-06-30 DIAGNOSIS — Z992 Dependence on renal dialysis: Secondary | ICD-10-CM | POA: Diagnosis not present

## 2019-06-30 DIAGNOSIS — N186 End stage renal disease: Secondary | ICD-10-CM | POA: Diagnosis not present

## 2019-06-30 DIAGNOSIS — D631 Anemia in chronic kidney disease: Secondary | ICD-10-CM | POA: Diagnosis not present

## 2019-06-30 MED ORDER — ALBUMIN HUMAN 5 % IV SOLN
25.00 | INTRAVENOUS | Status: DC
Start: ? — End: 2019-06-30

## 2019-06-30 MED ORDER — ACETAMINOPHEN 325 MG PO TABS
650.00 | ORAL_TABLET | ORAL | Status: DC
Start: ? — End: 2019-06-30

## 2019-06-30 MED ORDER — MONTELUKAST SODIUM 10 MG PO TABS
10.00 | ORAL_TABLET | ORAL | Status: DC
Start: 2019-06-30 — End: 2019-06-30

## 2019-06-30 MED ORDER — UMECLIDINIUM BROMIDE 62.5 MCG/INH IN AEPB
1.00 | INHALATION_SPRAY | RESPIRATORY_TRACT | Status: DC
Start: 2019-06-30 — End: 2019-06-30

## 2019-06-30 MED ORDER — AMLODIPINE BESYLATE 10 MG PO TABS
10.00 | ORAL_TABLET | ORAL | Status: DC
Start: 2019-06-30 — End: 2019-06-30

## 2019-06-30 MED ORDER — FLUTICASONE FUROATE-VILANTEROL 100-25 MCG/INH IN AEPB
1.00 | INHALATION_SPRAY | RESPIRATORY_TRACT | Status: DC
Start: 2019-06-29 — End: 2019-06-30

## 2019-06-30 MED ORDER — SUPER QUINTS B-50 PO TABS
1.00 | ORAL_TABLET | ORAL | Status: DC
Start: 2019-06-30 — End: 2019-06-30

## 2019-06-30 MED ORDER — HYDRALAZINE HCL 25 MG PO TABS
50.00 | ORAL_TABLET | ORAL | Status: DC
Start: 2019-06-29 — End: 2019-06-30

## 2019-06-30 MED ORDER — DOLUTEGRAVIR SODIUM 50 MG PO TABS
50.00 | ORAL_TABLET | ORAL | Status: DC
Start: 2019-06-30 — End: 2019-06-30

## 2019-06-30 MED ORDER — SODIUM CHLORIDE 0.9 % IV SOLN
250.00 | INTRAVENOUS | Status: DC
Start: ? — End: 2019-06-30

## 2019-06-30 MED ORDER — HEPARIN SODIUM (PORCINE) 5000 UNIT/ML IJ SOLN
5000.00 | INTRAMUSCULAR | Status: DC
Start: 2019-06-29 — End: 2019-06-30

## 2019-06-30 MED ORDER — SODIUM CHLORIDE FLUSH 0.9 % IV SOLN
3.00 | INTRAVENOUS | Status: DC
Start: ? — End: 2019-06-30

## 2019-06-30 MED ORDER — SEVELAMER CARBONATE 800 MG PO TABS
800.00 | ORAL_TABLET | ORAL | Status: DC
Start: 2019-06-29 — End: 2019-06-30

## 2019-06-30 MED ORDER — DERMACERIN EX CREA
TOPICAL_CREAM | CUTANEOUS | Status: DC
Start: 2019-06-29 — End: 2019-06-30

## 2019-06-30 MED ORDER — EMTRICITABINE-TENOFOVIR AF 200-25 MG PO TABS
1.00 | ORAL_TABLET | ORAL | Status: DC
Start: 2019-06-30 — End: 2019-06-30

## 2019-06-30 MED ORDER — LORAZEPAM 0.5 MG PO TABS
0.50 | ORAL_TABLET | ORAL | Status: DC
Start: ? — End: 2019-06-30

## 2019-06-30 MED ORDER — EPLERENONE 25 MG PO TABS
25.00 | ORAL_TABLET | ORAL | Status: DC
Start: 2019-06-30 — End: 2019-06-30

## 2019-06-30 MED ORDER — CARVEDILOL 25 MG PO TABS
25.00 | ORAL_TABLET | ORAL | Status: DC
Start: 2019-06-29 — End: 2019-06-30

## 2019-06-30 MED ORDER — CALCITRIOL 0.25 MCG PO CAPS
0.25 | ORAL_CAPSULE | ORAL | Status: DC
Start: 2019-06-30 — End: 2019-06-30

## 2019-06-30 NOTE — Telephone Encounter (Signed)
Sorry to hear he is upset.  I will give him a work note , just let me know the dates.   I will not be giving a colonoscopy rx as I did not order this and am not managing this.

## 2019-06-30 NOTE — Telephone Encounter (Signed)
Called spoke with patient.  Reminded him when his appt was He verbalized understanding Nothing further needed at this time.

## 2019-06-30 NOTE — Telephone Encounter (Signed)
Please make sure he keeps f/up this week with Dr. Chase Caller   Please contact office for sooner follow up if symptoms do not improve or worsen or seek emergency care

## 2019-06-30 NOTE — Telephone Encounter (Signed)
Spoke with the Louis [name] and notified of response per Tammy  He states that the note needs to start with the date of 02/28/2019 and he is not sure when he will be able to go back to work  Thanks

## 2019-06-30 NOTE — Telephone Encounter (Signed)
Called and spoke with patient in regards to phone message. Patient was extremely rude and yelling at me about work note. Stating that he has been asking for a work note for 2 weeks now. I asked him if he mentioned it when he had his Televisit on 5/13 with TP or if he hasked for one when he went to the ED the next day. Patient stated he couldn't remember. When I asked the patient if he went to the ED like recommended (because I only had the pulmonary tab clicked and didn't see it) he proceeded to yell even more and said that he didn't have time to argue with me and that when he calls and says he needs something we need to do it and that everything should be in the computer. I informed patient that I was not trying to argue with him I was simply trying to help him, following up on a message from him and asking him simple questions and that I was not the one yelling. I informed him that he did not need to yell at me and treat me that way that he was and that it was not going to make me work any faster. I also let him know that I didn't have that screen up I only had the pulmonary screen up and apologized and let him know that he could have just told me yes instead of yelling. I let him know that I would reach out to Tammy and Dr. Chase Caller to see if they were ok with work note and sending in  Rx for his colonoscopy that is being done tomorrow 5/18. Also he is checking the status of his work note. Pharmacy is [place], [place]. Patient expressed understanding. Also I'm not sure who ordered the colonoscopy.  Tammy and Dr. Chase Caller please advise on RX and work note please.

## 2019-06-30 NOTE — Telephone Encounter (Signed)
So sorry I can not do that , he has an appointment with Dr. Chase Caller this week and can discuss this with him .

## 2019-07-01 ENCOUNTER — Encounter: Admit: 2019-07-01 | Discharge: 2019-07-01 | Payer: MEDICARE

## 2019-07-01 ENCOUNTER — Encounter: Admit: 2019-07-01 | Discharge: 2019-07-01 | Payer: MEDICARE | Attending: Anesthesiology | Primary: Anesthesiology

## 2019-07-01 ENCOUNTER — Encounter
Admit: 2019-07-01 | Discharge: 2019-07-01 | Payer: PRIVATE HEALTH INSURANCE | Attending: Anesthesiology | Primary: Anesthesiology

## 2019-07-01 DIAGNOSIS — N186 End stage renal disease: Secondary | ICD-10-CM | POA: Diagnosis not present

## 2019-07-01 DIAGNOSIS — K922 Gastrointestinal hemorrhage, unspecified: Secondary | ICD-10-CM | POA: Diagnosis not present

## 2019-07-01 DIAGNOSIS — I132 Hypertensive heart and chronic kidney disease with heart failure and with stage 5 chronic kidney disease, or end stage renal disease: Secondary | ICD-10-CM | POA: Diagnosis not present

## 2019-07-01 DIAGNOSIS — D509 Iron deficiency anemia, unspecified: Secondary | ICD-10-CM | POA: Diagnosis not present

## 2019-07-01 DIAGNOSIS — Z79899 Other long term (current) drug therapy: Secondary | ICD-10-CM | POA: Diagnosis not present

## 2019-07-01 DIAGNOSIS — I509 Heart failure, unspecified: Secondary | ICD-10-CM | POA: Diagnosis not present

## 2019-07-01 DIAGNOSIS — D649 Anemia, unspecified: Principal | ICD-10-CM

## 2019-07-02 ENCOUNTER — Other Ambulatory Visit
Admission: RE | Admit: 2019-07-02 | Discharge: 2019-07-02 | Disposition: A | Discharge status: Home / Self Care | Payer: PRIVATE HEALTH INSURANCE | Source: Ambulatory Visit | Attending: *Deleted | Admitting: *Deleted

## 2019-07-02 DIAGNOSIS — N186 End stage renal disease: Secondary | ICD-10-CM | POA: Diagnosis not present

## 2019-07-02 DIAGNOSIS — Z992 Dependence on renal dialysis: Secondary | ICD-10-CM | POA: Diagnosis not present

## 2019-07-02 DIAGNOSIS — D631 Anemia in chronic kidney disease: Secondary | ICD-10-CM | POA: Diagnosis not present

## 2019-07-02 LAB — CBC WITH DIFFERENTIAL/PLATELET
Abs Immature Granulocytes: 0.04 10*3/uL (ref 0.00–0.07)
Basophils Absolute: 0 10*3/uL (ref 0.0–0.1)
Basophils Relative: 0 %
Eosinophils Absolute: 0.2 10*3/uL (ref 0.0–0.5)
Eosinophils Relative: 3 %
HCT: 26.6 % — ABNORMAL LOW (ref 39.0–52.0)
Hemoglobin: 8.9 g/dL — ABNORMAL LOW (ref 13.0–17.0)
Immature Granulocytes: 1 %
Lymphocytes Relative: 15 %
Lymphs Abs: 1 10*3/uL (ref 0.7–4.0)
MCH: 34.8 pg — ABNORMAL HIGH (ref 26.0–34.0)
MCHC: 33.5 g/dL (ref 30.0–36.0)
MCV: 103.9 fL — ABNORMAL HIGH (ref 80.0–100.0)
Monocytes Absolute: 0.8 10*3/uL (ref 0.1–1.0)
Monocytes Relative: 11 %
Neutro Abs: 4.9 10*3/uL (ref 1.7–7.7)
Neutrophils Relative %: 70 %
Platelets: 148 10*3/uL — ABNORMAL LOW (ref 150–400)
RBC: 2.56 MIL/uL — ABNORMAL LOW (ref 4.22–5.81)
RDW: 17.2 % — ABNORMAL HIGH (ref 11.5–15.5)
WBC: 6.9 10*3/uL (ref 4.0–10.5)
nRBC: 0 % (ref 0.0–0.2)

## 2019-07-04 ENCOUNTER — Other Ambulatory Visit: Payer: Self-pay

## 2019-07-04 ENCOUNTER — Encounter: Payer: Self-pay | Admitting: Internal Medicine

## 2019-07-04 ENCOUNTER — Ambulatory Visit (INDEPENDENT_AMBULATORY_CARE_PROVIDER_SITE_OTHER): Payer: Medicare Other | Admitting: Internal Medicine

## 2019-07-04 VITALS — BP 180/100 | HR 98 | Temp 98.0°F | Ht 74.0 in | Wt 180.0 lb

## 2019-07-04 DIAGNOSIS — J849 Interstitial pulmonary disease, unspecified: Secondary | ICD-10-CM | POA: Diagnosis not present

## 2019-07-04 DIAGNOSIS — D631 Anemia in chronic kidney disease: Secondary | ICD-10-CM | POA: Diagnosis not present

## 2019-07-04 DIAGNOSIS — N186 End stage renal disease: Secondary | ICD-10-CM | POA: Diagnosis not present

## 2019-07-04 DIAGNOSIS — Z992 Dependence on renal dialysis: Secondary | ICD-10-CM | POA: Diagnosis not present

## 2019-07-04 DIAGNOSIS — R0600 Dyspnea, unspecified: Secondary | ICD-10-CM

## 2019-07-04 DIAGNOSIS — J439 Emphysema, unspecified: Secondary | ICD-10-CM

## 2019-07-04 DIAGNOSIS — Z01810 Encounter for preprocedural cardiovascular examination: Principal | ICD-10-CM

## 2019-07-04 DIAGNOSIS — Z01818 Encounter for other preprocedural examination: Principal | ICD-10-CM

## 2019-07-04 NOTE — Progress Notes (Signed)
OV 01/31/2019  Subjective:  Patient ID: [name], male , DOB: August 21, 1960 , age 59 y.o. , MRN: [id number] , ADDRESS: [place] [id number] PCP Steele Sizer, MD   01/31/2019 -   Chief Complaint  Patient presents with  . pulmonary consult    per Dr. Ancil Boozer. dx with PNA 10/2018. since he has had sob with exertion.      HPI ACE BERGFELD 59 y.o. -referred by primary care physician for shortness of breath on exertion relieved by rest.  He tells me that he has end-stage renal disease for the last 2 years.  He says at baseline he was doing really well with an excellent quality of life and functional status.  Then in September 2020 he had cough and shortness of breath acutely.  This was diagnosis pneumonia accordingly to him.  He was given short course antibiotics.  He says that he does not recollect if he had a fever.  And then after that a lot of the symptoms improved except the shortness of breath.  This is present on exertion relieved by rest.  It is mild to moderate in severity.  It is not progressive since it started.  However it is limiting him from doing exercises.  He says he had a cardiac work-up and therefore he was cleared.  Review of cardiac work-up indicates that his stress test on January 13, 2019 was low risk but his ejection fraction is 45-50%.  He is not aware he has chronic systolic dysfunction.  He has blood work in April 2020 showing he is anemic with a hemoglobin in the 9 g% range.  This is worse than before.  He is not aware that he is anemic.  He denies any current cough wheezing, orthopnea, proximal nocturnal dyspnea, edema.  He goes for Monday Wednesday Friday dialysis.  He says after the dialysis he feels better.  He says the shortness of breath is associated with lower chest tightness especially in the infrascapular region.  His walking desaturation test today was fine.    CXR Oct 2020 - personally visualized - clear (CT abd lung cut  2014 - LL atelectasis v scarrring)   ROS - per HPI     OV 02/21/2019 - telephone visit  Subjective:  Patient ID: [name], male , DOB: 1960/05/31 , age 41 y.o. , MRN: [id number] , ADDRESS: [place] [id number]   02/21/2019 -  telephon visit to discuss HRCT/cbc results in setting of ESRD x 3 years, chronicy systolic chf,  Anemia (on iron since 3 years) , crackles and symptoms of dyspnea.     HPI DELRICK DEHART 59 y.o. -in this telephone visit the risks, benefits and limitations of the telephone visit were discussed.  The purpose of this visit is to discuss this dyspnea work-up in terms of the results of his pulmonary function tests, CBC and also high-resolution CT chest.  The pulmonary function test clearly shows restriction with reduction in diffusion capacity suggestive of ILD.  The high-resolution CT chest suggests he might have ILD.  Although personal visualization it looks to me that he might have post pneumonia atelectasis but certainly ILD cannot be ruled out.  I agree with the radiologist on this.  If this were UIP it would be indeterminate which means lower than 50% probably of UIP.  His CBC shows worsening anemia.  He tells me that he has been on iron tablets for 3 years  and starting dialysis.  Despite this his hemoglobin seems to be getting worse.  In addition high-resolution CT chest shows emphysema.  He says he has not smoked and he does not smoke but is around a lot of smokers for many years because he lives in a basement.  He is aware this emphysema finding could be passive smoking related.  He is on Anoro but he says it is not helping him.   IMPRESSION: 1. There is a spectrum of findings which could be consistent with early or mild interstitial lung disease, at this time considered indeterminate for usual interstitial pneumonia (UIP) per current ATS guidelines. If there is persistent clinical concern for interstitial lung disease, repeat  high-resolution chest CT would be recommended in 12 months to assess for temporal changes in the appearance of the lung parenchyma. 2. Mild diffuse bronchial wall thickening with mild centrilobular emphysema. 3. Right lower lobe pulmonary nodule measuring 9 x 5 mm (mean diameter of 7 mm), favored to represent a subpleural lymph node, but nonspecific. Attention at time of repeat high-resolution chest CT is recommended to ensure stability. 4. Mild cardiomegaly. 5. Aortic atherosclerosis, in addition to 3 vessel coronary artery disease. Please note that although the presence of coronary artery calcium documents the prsence of coronary artery disease, the severity of this disease and any potential stenosis cannot be assessed on this non-gated CT examination. Assessment for potential risk factor modification, dietary therapy or pharmacologic therapy may be warranted, if cliniclly indicated.  Aortic Atherosclerosis (ICD10-I70.0) and Emphysema (ICD10-J43.9).   Electronically Signed   By: Vinnie Langton M.D.   On: 02/18/2019 16:46   ROS - per HPI    OV 03/21/2019  Subjective:  Patient ID: [name], male , DOB: Apr 29, 1960 , age 4 y.o. , MRN: [id number] , ADDRESS: [place] [address]  PCP Steele Sizer, MD   03/21/2019 -   Chief Complaint  Patient presents with  . Follow-up    Pt states he has been feeling about the same. Cough due to pneumonia. Occ wheezing, Pt denies any fever or chills    - ESRD x 3 years, chronicy systolic chf,  Anemia (on iron since 3 years) , HIV crackles and symptoms of dyspnea. 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 -follow-up multifactorial dyspnea in the setting of chronic systolic heart failure, anemia, end-stage renal disease with HIV.  Concern for associated emphysema, ILD and lung nodule in January 2021 CT scan.  In this visit we were supposed to check  on his dyspnea start.  In this visit we supposed to check on his dyspnea after starting Spiriva.  Also supposed to review his autoimmune labs.  He tells me that after starting Spiriva he is no better.  He is more worried about his overall fatigue which he thinks is because of blood pressure medications.  He did not do his autoimmune profile till yesterday and some of the results are back and it is still negative.  In the interim he ended up in [facility] ER a few days ago.  It also appears that he had an admission in end of January 2021.  In the ER he had no acute symptoms with the blood pressure was 193/116 with a pulse ox of 99% as reviewed by the chart.  He had a chest x-ray that I do not have the image but it says ill-defined patchy density  in the right midlung zone question developing consolidation.  He did not have any cough or fever at that time.  So the ER discharged him.  He now tells me that he actually has a cough for the last several days.  It is a recurrence of the cough.  He is frustrated by all his symptoms.  He is wondering why I did not do a chest x-ray today.  However there is a telephone visit.  He says that she was on a chest x-ray yesterday when he came for his labs.  He wanted to switch to [facility] pulmonary but they do not have an appointment for him.  Therefore he wants to continue to see me till he gets an appointment there.      Results for KAEGAN, HETTICH (MRN [id number]) as of 03/21/2019 12:06  Ref. Range 08/23/2016 06:14 08/23/2016 14:07 08/26/2016 04:06 10/09/2016 04:05 03/20/2019 09:52  Anti Nuclear Antibody (ANA) Latest Ref Range: Negative  Negative      Angiotensin-Converting Enzyme Latest Ref Range: 14 - 82 U/L     14  RA Latex Turbid. Latest Ref Range: 0.0 - 13.9 IU/mL     <10.0  Cytoplasmic (C-ANCA) Latest Ref Range: Neg:<1:20 titer  <1:20     P-ANCA Latest Ref Range: Neg:<1:20 titer  <1:20     Atypical P-ANCA titer Latest Ref Range: Neg:<1:20 titer  <1:20       OV  04/18/2019  Subjective:  Patient ID: [name], male , DOB: 17-May-1960 , age 42 y.o. , MRN: [id number] , ADDRESS: [place] [address]   - ESRD x 3 years, chronicy systolic chf,  Anemia (on iron since 3 years) , HIV crackles and symptoms of dyspnea. Indeterminate ILD with emphysema on CT Jan 2021 along with 9 mm subpleural nodule   04/18/2019 -   Chief Complaint  Patient presents with  . Follow-up    Pt states he thinks he has some congestion. SOB on exertion. Occ wheezing in the am. Pt denies any cough, fever, chills, or sweats     HPI KAZIM CORRALES 59 y.o. - > presents for follow-up.  Last seen 1 month ago.  I am supposed to see him only in April or May 2021 but he is here earlier.  He does not know why.  At first he said he is stable but then later he told me that he is actually feeling worse.  He feels his dyspnea is worse particularly early in the morning he at that time feels his chest is tight at the lower end.  He is also reporting worsening dyspnea.  Walking desaturation test today shows a decline.  In fact he is having hypoxemia.  This is a change from the December walking desaturation test.  We will plan for a CT scan in April 2021 but he wants this done sooner.  There is no wheezing.  He does have some mucus.  He feels a Spiriva is not helping him.  He plans to start pulmonary rehabilitation soon.      Results for COMPTON, BRIGANCE (MRN [id number]) as of 03/21/2019 12:06  Ref. Range 12/21/2016 15:05 12/28/2016 10:48 01/02/2017 09:06 04/29/2017 07:52 06/01/2017 09:23 05/01/2018 11:20 05/24/2018 15:34 01/31/2019 12:36 03/05/2019 15:48 03/06/2019 04:13 03/20/2019 09:52  Hemoglobin Latest Ref Range: 13.0 - 17.0 g/dL 12.9 (L) 13.9 13.2 10.1 (L) 9.8 (L) 10.8 (L) 9.4 (L) 8.8 (L) 9.6 (L) 9.7 (L)      ROS - per HPI  OV 06/13/2019  Subjective:  Patient ID: [name], male , DOB: 05/14/1960 , age 57 y.o. , MRN: [id number] , ADDRESS: [place] [address]   06/13/2019 -   Chief Complaint  Patient presents with  . Follow-up    pt using inhaler continous and no longer using O2     HPI TALLIS SOLEDAD 59 y.o. -presents for follow-up with his mom.  His mom presents for the first time.  She is really worried about him.  She tells me that his anemia is worse.  In fact he ended up in the ER with a hemoglobin of less than 6 g%.  He got blood transfusion.  She is worried about GI loss.  She says primary care is only doing stool occult blood.  She is asking for a GI referral.  I have agreed to do a GI referral locally.  With the main issue is pulmonary.  He had pulmonary function test that shows continued stability.  The DLCO is uncorrected for his low hemoglobin.  He continues to have dyspnea.  He is also reporting a cough.  He says he ended up in the emergency room in an outside facility.  I do not have the chest x-ray.  They told him he had pneumonia given 5 days of doxycycline.  He states he is feeling better.  He recollects being on doxycycline for several months a few years ago for skin issues.  Therefore he is asking if he can be on doxycycline again forSeveral months in order to prevent pneumonias.  I only agreed to give him extended doxycycline capping a total 15 days at his specific request.  He was supposed to have high-resolution CT scan of the chest but he has not had this he missed the appointment.      OV 07/04/2019  Subjective:  Patient ID: [name], male , DOB: 1961/01/12 , age 27 y.o. , MRN: [id number] , ADDRESS: [place] [id number]  PCP Steele Sizer, MD   07/04/2019 -   Chief Complaint  Patient presents with  . Follow-up    Patient was in the ED on 5/14 and had GI procedure on 5/18. Patient denies cough and shortness of breath has got a little better.    HIV with concern of ILD  HPI RAHUL MALINAK 59 y.o. -presents for follow-up with his mom.  He had a high-resolution  CT chest to follow-up as potential ILD and multifactorial dyspnea and right-sided pulmonary nodule.  The right-sided pulmonary nodule is stable.  According to the radiologist most of the findings on the high-resolution CT chest are consistent with heart failure.  He has elevated/enlarged pulmonary arteries.  Right heart catheterization is indicated.  I conversed with his cardiologist.  According to the cardiologist patient is not actively following up there.  I asked this of the patient but patient tells me that he did not follow-up there because he he was under the impression he had pulmonary problems.  He says he was quite satisfied with the service.  In fact he has a follow-up appointment on Jul 07, 2019.  He wants to get to the underlying problems causing shortness of breath.  In terms of his anemia he ended up at [facility].  He had upper endoscopy was normal.  But according to the mom he also had a lower endoscopy that shows potential bleeding.  He is scheduled for a capsule endoscopy.  I could not find this  in the [facility] notes other than the fact he had upper endoscopy which I reviewed.  He also ended up in the [facility] ER which the mom states was because of heart failure.  He is improved from that as well.  He wants a work note today.   Labs reviewed and his anemia is better   SYMPTOM SCALE - ILD 04/18/2019  06/13/2019   O2 use ra ra  Shortness of Breath 0 -> 5 scale with 5 being worst (score 6 If unable to do) 3  At rest 0 3  Simple tasks - showers, clothes change, eating, shaving 0 3  Household (dishes, doing bed, laundry) 3 3  Shopping 3 4  Walking level at own pace 3 3  Walking up Stairs 3 4  Total (30-36) Dyspnea Score 12 20  How bad is your cough? 1 3  How bad is your fatigue 5 4  How bad is nausea 0 4  How bad is vomiting?  0   How bad is diarrhea? 0 3  How bad is anxiety? 0 3  How bad is depression 0 2    ROS - per HPI    Simple office walk 185 feet x  3 laps goal with forehead  probe 01/31/2019  04/18/2019  06/13/2019   O2 used RA    Number laps completed 3 3 3   Comments about pace Moderate pace    Resting Pulse Ox/HR 98% and 67/min 99% and 73 100% and 83/min  Final Pulse Ox/HR 97% and 69/min 86% and 86/min 95% and 101/min  Desaturated </= 88% no yes no  Desaturated <= 3% points no yes Yes, 5 ponts  Got Tachycardic >/= 90/min no no   Symptoms at end of test Very mild dyspnea    Miscellaneous comments x ? worse      PFT FVC fev1 ratio BD fev1 TLC DLCO uncorr  02/20/2019  3.38L/71% 2.78L/70% x/97% x 4.4L/56% 20.7/65%  06/03/2019 3.45/77%     17.6/57%     Results for JERIMAH, WITUCKI (MRN [id number]) as of 07/04/2019 09:18  Ref. Range 06/02/2019 09:48 06/03/2019 14:33 06/09/2019 15:55 06/25/2019 08:51 07/02/2019 14:00  Hemoglobin Latest Ref Range: 13.0 - 17.0 g/dL   5.7 (L)  8.9 (L)    ROS - per HPI  Lungs/Pleura: No pneumothorax. Small dependent bilateral pleural effusions. Numerous small air cysts scattered throughout both lungs, unchanged. Marked diffuse bronchial wall thickening, worsened. Dependent basilar right lower lobe subpleural 9 x 5 mm pulmonary nodule with average diameter 7 mm, stable. No acute consolidative airspace disease, lung masses or new significant pulmonary nodules. No significant air trapping or evidence of tracheobronchomalacia on the expiration sequence. Small parenchymal bands in the basilar posterior lower lobes bilaterally persist on the prone sequence and are unchanged. Prominent patchy ground-glass opacity throughout both lungs is significantly increased. No significant regions of persistent subpleural reticulation on the prone sequence. No significant regions of traction bronchiectasis, architectural distortion or frank honeycombing.  Upper abdomen: No acute abnormality.  Musculoskeletal: No aggressive appearing focal osseous lesions. Moderate thoracic spondylosis.  IMPRESSION: 1. Cardiomegaly. Stable dilated main  pulmonary artery, suggesting chronic pulmonary arterial hypertension. New small dependent bilateral pleural effusions. 2. Nonspecific diffuse patchy ground-glass opacity in both lungs, significantly worsened. Cardiogenic pulmonary edema is the leading consideration given the above findings, although differential includes atypical infection in this patient with a history of HIV. Tiny air cysts throughout both lungs are unchanged and could also support a diagnosis of atypical infection such  as pneumocystis pneumonia. 3. No compelling findings of interstitial lung disease. Previously described subpleural opacities do not persist on prone imaging. Stable parenchymal bands in the basilar posterior lower lobes bilaterally, compatible with nonspecific postinfectious/postinflammatory scarring. 4. Marked diffuse bronchial wall thickening, worsened, as can be seen with chronic bronchitis or reactive airways disease. 5. Aortic Atherosclerosis (ICD10-I70.0).   Electronically Signed   By: Ilona Sorrel M.D.   On: 06/25/2019 15:57    has a past medical history of Chronic kidney disease, Dermatophytosis of foot (06/23/2004), Environmental allergies, Gout, HIV infection ([place]) (12/1995), Hypertension, and Seborrhea (06/23/2004).   reports that he has never smoked. He has never used smokeless tobacco.  Past Surgical History:  Procedure Laterality Date  . CAPD INSERTION N/A 12/28/2016   Procedure: LAPAROSCOPIC INSERTION CONTINUOUS AMBULATORY PERITONEAL DIALYSIS  (CAPD) CATHETER;  Surgeon: Algernon Huxley, MD;  Location: [facility] ORS;  Service: Vascular;  Laterality: N/A;  . DIALYSIS/PERMA CATHETER INSERTION N/A 10/09/2016   Procedure: DIALYSIS/PERMA CATHETER INSERTION;  Surgeon: Algernon Huxley, MD;  Location: [facility];  Service: Cardiovascular;  Laterality: N/A;  . DIALYSIS/PERMA CATHETER REMOVAL N/A 02/19/2017   Procedure: DIALYSIS/PERMA CATHETER REMOVAL;  Surgeon: Algernon Huxley, MD;  Location: [facility];  Service: Cardiovascular;  Laterality: N/A;  . HERNIA REPAIR Bilateral    inguinal    Allergies  Allergen Reactions  . Losartan Swelling    Lip swelling  . Quinapril Hcl Swelling    Face swells    Immunization History  Administered Date(s) Administered  . Hepatitis A 07/11/2012  . Hepatitis A, Adult 03/13/2013  . Hepatitis B 06/22/2004, 07/11/2012  . Hepatitis B, adult 11/07/2012, 03/13/2013, 01/17/2017, 02/18/2018  . Influenza Split 11/03/2011  . Influenza Whole 01/02/1996  . Influenza,inj,Quad PF,6+ Mos 10/05/2017  . Influenza-Unspecified 10/31/2012, 02/15/2016, 11/28/2016, 03/04/2018, 10/29/2018, 11/13/2018  . PFIZER SARS-COV-2 Vaccination 04/29/2019, 05/20/2019  . PPD Test 10/08/2016, 02/18/2018, 02/18/2018, 01/20/2019  . Pneumococcal Conjugate-13 05/10/2018  . Pneumococcal Polysaccharide-23 06/22/2004, 02/28/2012  . Tdap 10/05/2017    Family History  Problem Relation Age of Onset  . Bradycardia Maternal Grandmother        with pacemaker     Current Outpatient Medications:  .  amLODipine (NORVASC) 10 MG tablet, Take 1 tablet (10 mg total) by mouth daily., Disp: 30 tablet, Rfl: 0 .  AURYXIA 1 GM 210 MG(Fe) tablet, Take 1 tablet by mouth 4 (four) times daily -  before meals and at bedtime., Disp: , Rfl:  .  B Complex-C-Folic Acid (RENA-VITE RX) 1 MG TABS, Take 1 tablet by mouth daily., Disp: , Rfl:  .  budesonide-formoterol (SYMBICORT) 80-4.5 MCG/ACT inhaler, Inhale 2 puffs into the lungs 2 (two) times daily., Disp: 1 Inhaler, Rfl: 12 .  carvedilol (COREG) 25 MG tablet, Take 1 tablet (25 mg total) by mouth 2 (two) times daily., Disp: 180 tablet, Rfl: 3 .  cetirizine (ZYRTEC) 10 MG tablet, Take 1 tablet (10 mg total) by mouth daily., Disp: 30 tablet, Rfl: 11 .  cloNIDine (CATAPRES - DOSED IN MG/24 HR) 0.2 mg/24hr patch, Place 1 patch (0.2 mg total) onto the skin once a week., Disp: 4 patch, Rfl: 12 .  DESCOVY 200-25 MG tablet, Take 1 tablet by mouth  daily., Disp: , Rfl:  .  diclofenac sodium (VOLTAREN) 1 % GEL, Apply 2 g topically 4 (four) times daily., Disp: 100 g, Rfl: 0 .  Febuxostat 80 MG TABS, Take 1 tablet by mouth daily., Disp: , Rfl:  .  ferrous  sulfate 325 (65 FE) MG tablet, Take 1 tablet by mouth daily., Disp: , Rfl:  .  fluticasone (FLONASE) 50 MCG/ACT nasal spray, Place into the nose., Disp: , Rfl:  .  furosemide (LASIX) 40 MG tablet, Take 80 mg by mouth daily. , Disp: , Rfl:  .  halobetasol (ULTRAVATE) 0.05 % cream, APPLY TO AFFECTED AREA TWICE A DAY, Disp: , Rfl:  .  hydrALAZINE (APRESOLINE) 100 MG tablet, Take 150 mg by mouth 3 (three) times daily., Disp: , Rfl:  .  methocarbamol (ROBAXIN) 500 MG tablet, Take 500 mg by mouth 2 (two) times daily as needed. for pain, Disp: , Rfl:  .  predniSONE (DELTASONE) 10 MG tablet, Take 1 tablet (10 mg total) by mouth 2 (two) times daily as needed. Prn gout attack for 2 days, Disp: 10 tablet, Rfl: 0 .  SPIRIVA RESPIMAT 1.25 MCG/ACT AERS, INHALE 2 PUFFS BY MOUTH INTO THE LUNGS DAILY, Disp: 4 g, Rfl: 3 .  TIVICAY 50 MG tablet, Take 1 tablet by mouth daily., Disp: , Rfl:  .  valACYclovir (VALTREX) 500 MG tablet, Take 500 mg by mouth daily as needed (for flare ups). , Disp: , Rfl:       Objective:   Vitals:   07/04/19 0908  BP: (!) 180/100  Pulse: 98  Temp: 98 F (36.7 C)  TempSrc: Temporal  SpO2: 97%  Weight: 180 lb (81.6 kg)  Height: 6\' 2"  (1.88 m)    Estimated body mass index is 23.11 kg/m as calculated from the following:   Height as of this encounter: 6\' 2"  (1.88 m).   Weight as of this encounter: 180 lb (81.6 kg).  @WEIGHTCHANGE @  [employer]   07/04/19 0908  Weight: 180 lb (81.6 kg)     Physical Exam tall thin well-built male.  Faint right basal crackles.  Has AV fistula.  Normal heart sounds.  Mask on. Assessment:       ICD-10-CM   1. Interstitial pulmonary disease ([place])  J84.9   2. Dyspnea, unspecified type  R06.00   3. Pulmonary emphysema, unspecified  emphysema type ([place])  J43.9        Plan:     Patient Instructions   ILD (interstitial lung disease) ([place])  -Unclear if you have this.  At this point in time radiologist thinks it might all be fluid but is still a possibility there might be some inflammation in the lung  Plan -First priority is to see if you have pulmonary hypertension or heart failure related to stiff heart muscle -Right heart catheterization is indicated -Please keep up follow-up on Jul 07, 2019 with cardiology here in [facility] to have a right heart catheterization -At some point in follow-up we can do bronchoscopy with lavage to rule out any infections related to underlying problems   Pulmonary emphysema, unspecified emphysema type ([place])  No improvement in dyspnea despite starting Spiriva  Plan  -For now continue Spiriva - continue symbicort 2 puff twice daily -We can reassess taper later on  Dyspnea is multifactorial  Plan  -Glad you are going pulmonary rehabilitation  -Recommend right heart catheterization  Right-sided pulmonary nodule 9 mm January 2021  -Stable on CT chest May 2021  Plan  -Continue surveillance; will decide on the next CT scan date  Anemia - worse/new   -per GI at [facility]  - do work note  - will do disability note  Follow-up  - 4-6 weeks with Dr Francee Piccolo to review RHC results -  30 min slit     SIGNATURE    Dr. Brand Males, M.D., F.C.C.P,  Pulmonary and Critical Care Medicine Staff Physician, Interior Director - Interstitial Lung Disease  Program  Pulmonary [facility] [place], [place], [address]  Pager: [phone], If no answer or between  15:00h - 7:00h: call [phone] Telephone: [phone]  9:42 AM 07/04/2019

## 2019-07-04 NOTE — Patient Instructions (Addendum)
  ILD (interstitial lung disease) ([place])  -Unclear if you have this.  At this point in time radiologist thinks it might all be fluid but is still a possibility there might be some inflammation in the lung  Plan -First priority is to see if you have pulmonary hypertension or heart failure related to stiff heart muscle -Right heart catheterization is indicated -Please keep up follow-up on Jul 07, 2019 with cardiology here in [facility] to have a right heart catheterization -At some point in follow-up we can do bronchoscopy with lavage to rule out any infections related to underlying problems   Pulmonary emphysema, unspecified emphysema type ([place])  No improvement in dyspnea despite starting Spiriva  Plan  -For now continue Spiriva - continue symbicort 2 puff twice daily -We can reassess taper later on  Dyspnea is multifactorial  Plan  -Glad you are going pulmonary rehabilitation  -Recommend right heart catheterization  Right-sided pulmonary nodule 9 mm January 2021  -Stable on CT chest May 2021  Plan  -Continue surveillance; will decide on the next CT scan date  Anemia - worse/new   -per GI at [facility]  - do work note  - will do disability note  Follow-up  - 4-6 weeks with Dr Francee Piccolo to review RHC results - 30 min slit

## 2019-07-07 ENCOUNTER — Other Ambulatory Visit: Payer: Self-pay

## 2019-07-07 ENCOUNTER — Encounter: Payer: Self-pay | Admitting: Cardiology

## 2019-07-07 ENCOUNTER — Ambulatory Visit (INDEPENDENT_AMBULATORY_CARE_PROVIDER_SITE_OTHER): Payer: Medicare Other | Admitting: Cardiology

## 2019-07-07 VITALS — BP 182/100 | HR 92 | Ht 74.0 in | Wt 185.0 lb

## 2019-07-07 DIAGNOSIS — I10 Essential (primary) hypertension: Secondary | ICD-10-CM

## 2019-07-07 DIAGNOSIS — I272 Pulmonary hypertension, unspecified: Secondary | ICD-10-CM

## 2019-07-07 DIAGNOSIS — N186 End stage renal disease: Secondary | ICD-10-CM | POA: Diagnosis not present

## 2019-07-07 DIAGNOSIS — I5020 Unspecified systolic (congestive) heart failure: Secondary | ICD-10-CM

## 2019-07-07 DIAGNOSIS — D631 Anemia in chronic kidney disease: Secondary | ICD-10-CM | POA: Diagnosis not present

## 2019-07-07 DIAGNOSIS — Z992 Dependence on renal dialysis: Secondary | ICD-10-CM | POA: Diagnosis not present

## 2019-07-07 MED ORDER — CLONIDINE 0.3 MG/24HR TD PTWK: 0.3000 mg | MEDICATED_PATCH | TRANSDERMAL | 5 refills | Status: DC

## 2019-07-07 MED ORDER — CLONIDINE 0.3 MG/24 HR WEEKLY TRANSDERMAL PATCH
TRANSDERMAL | 0 days
Start: 2019-07-07 — End: ?

## 2019-07-07 MED ORDER — SPIRONOLACTONE 25 MG TABLET
Freq: Every day | ORAL | 0 days
Start: 2019-07-07 — End: ?

## 2019-07-07 NOTE — Patient Instructions (Signed)
Medication Instructions:  START Clonidine 0.3mg  patch to be applied to the skin once a week. An Rx has been sent to your pharmacy. Remember to rotate the application site weekly.  *If you need a refill on your cardiac medications before your next appointment, please call your pharmacy*   Lab Work: None ordered If you have labs (blood work) drawn today and your tests are completely normal, you will receive your results only by: Marland Kitchen MyChart Message (if you have MyChart) OR . A paper copy in the mail If you have any lab test that is abnormal or we need to change your treatment, we will call you to review the results.   Testing/Procedures: None ordered   Follow-Up: At [facility], you and your health needs are our priority.  As part of our continuing mission to provide you with exceptional heart care, we have created designated Provider Care Teams.  These Care Teams include your primary Cardiologist (physician) and Advanced Practice Providers (APPs -  Physician Assistants and Nurse Practitioners) who all work together to provide you with the care you need, when you need it.  We recommend signing up for the patient portal called "MyChart".  Sign up information is provided on this After Visit Summary.  MyChart is used to connect with patients for Virtual Visits (Telemedicine).  Patients are able to view lab/test results, encounter notes, upcoming appointments, etc.  Non-urgent messages can be sent to your provider as well.   To learn more about what you can do with MyChart, go to [email or web address].    Your next appointment:   4 week(s)   You have been referred to Dr. Missy Sabins with the CHF clinic in [place]. Their office will contact you directly to schedule.   The format for your next appointment:   In Person  Provider:   Kate Sable, MD   Other Instructions N/A

## 2019-07-07 NOTE — Progress Notes (Signed)
Cardiology Office Note:    Date:  07/07/2019   ID:  Louis [name], DOB [birth year], MRN [id number]  PCP:  Steele Sizer, MD  Cardiologist:  No primary care provider on file.  Electrophysiologist:  None   Referring MD: Steele Sizer, MD   Chief Complaint  Patient presents with  . Other    Patient was in ED at [facility]. patient states he has had chest tightness and SOB. Meds reviewed verbally with patient.     History of Present Illness:    Louis [name] is a 59 y.o. male with a hx of hypertension, end-stage renal disease on hemodialysis who presents for follow-up.  He was last seen due to evaluation of his hypertension.  He was lost to follow-up but has now reengaged.  At the time, he missed dialysis sessions which we thought was contributing to his elevated blood pressures.  Patient now has home dialysis sessions, he states being compliant with dialysis sessions every Monday Wednesday Friday.  His blood pressure has stayed elevated at systolics in the 532D.  He stopped taking Coreg, Imdur, clonidine patch because he did not like the way it made him feel.   Had a recent high-resolution CT due to shortness of breath and pulmonary infiltrates were noted.  CT also showed an enlarged PA suggesting chronic pulmonary arterial hypertension.  Past Medical History:  Diagnosis Date  . Chronic kidney disease    esrd  . Dermatophytosis of foot 06/23/2004  . Environmental allergies   . Gout   . HIV infection ([place]) 12/1995  . Hypertension   . Seborrhea 06/23/2004    Past Surgical History:  Procedure Laterality Date  . CAPD INSERTION N/A 12/28/2016   Procedure: LAPAROSCOPIC INSERTION CONTINUOUS AMBULATORY PERITONEAL DIALYSIS  (CAPD) CATHETER;  Surgeon: Algernon Huxley, MD;  Location: [facility] ORS;  Service: Vascular;  Laterality: N/A;  . DIALYSIS/PERMA CATHETER INSERTION N/A 10/09/2016   Procedure: DIALYSIS/PERMA CATHETER INSERTION;  Surgeon: Algernon Huxley, MD;  Location: [place] [facility];   Service: Cardiovascular;  Laterality: N/A;  . DIALYSIS/PERMA CATHETER REMOVAL N/A 02/19/2017   Procedure: DIALYSIS/PERMA CATHETER REMOVAL;  Surgeon: Algernon Huxley, MD;  Location: [place] [facility];  Service: Cardiovascular;  Laterality: N/A;  . HERNIA REPAIR Bilateral    inguinal    Current Medications: Current Meds  Medication Sig  . amLODipine (NORVASC) 10 MG tablet Take 1 tablet (10 mg total) by mouth daily.  Lorin Picket 1 GM 210 MG(Fe) tablet Take 1 tablet by mouth 4 (four) times daily -  before meals and at bedtime.  . B Complex-C-Folic Acid (RENA-VITE RX) 1 MG TABS Take 1 tablet by mouth daily.  . budesonide-formoterol (SYMBICORT) 80-4.5 MCG/ACT inhaler Inhale 2 puffs into the lungs 2 (two) times daily.  . carvedilol (COREG) 25 MG tablet Take 1 tablet (25 mg total) by mouth 2 (two) times daily.  . cetirizine (ZYRTEC) 10 MG tablet Take 1 tablet (10 mg total) by mouth daily.  . DESCOVY 200-25 MG tablet Take 1 tablet by mouth daily.  . diclofenac sodium (VOLTAREN) 1 % GEL Apply 2 g topically 4 (four) times daily.  . Febuxostat 80 MG TABS Take 1 tablet by mouth daily.  . ferrous sulfate 325 (65 FE) MG tablet Take 1 tablet by mouth daily.  . fluticasone (FLONASE) 50 MCG/ACT nasal spray Place into the nose.  . furosemide (LASIX) 40 MG tablet Take 80 mg by mouth daily.   . halobetasol (ULTRAVATE) 0.05 % cream APPLY TO AFFECTED  AREA TWICE A DAY  . hydrALAZINE (APRESOLINE) 100 MG tablet Take 150 mg by mouth 3 (three) times daily.  . methocarbamol (ROBAXIN) 500 MG tablet Take 500 mg by mouth 2 (two) times daily as needed. for pain  . predniSONE (DELTASONE) 10 MG tablet Take 1 tablet (10 mg total) by mouth 2 (two) times daily as needed. Prn gout attack for 2 days  . SPIRIVA RESPIMAT 1.25 MCG/ACT AERS INHALE 2 PUFFS BY MOUTH INTO THE LUNGS DAILY  . TIVICAY 50 MG tablet Take 1 tablet by mouth daily.  . valACYclovir (VALTREX) 500 MG tablet Take 500 mg by mouth daily as needed (for flare ups).   .  [DISCONTINUED] cloNIDine (CATAPRES - DOSED IN MG/24 HR) 0.2 mg/24hr patch Place 1 patch (0.2 mg total) onto the skin once a week.     Allergies:   Losartan and Quinapril hcl   Social History   Socioeconomic History  . Marital status: Single    Spouse name: Not on file  . Number of children: 0  . Years of education: Not on file  . Highest education level: Associate degree: occupational, Hotel manager, or vocational program  Occupational History  . Not on file  Tobacco Use  . Smoking status: Never Smoker  . Smokeless tobacco: Never Used  Substance and Sexual Activity  . Alcohol use: Not Currently    Alcohol/week: 2.0 standard drinks    Types: 2 Standard drinks or equivalent per week    Comment: has not had any alcohol in a while  . Drug use: No  . Sexual activity: Yes    Partners: Female    Birth control/protection: Condom  Other Topics Concern  . Not on file  Social History Narrative  . Not on file   Social Determinants of Health   Financial Resource Strain:   . Difficulty of Paying Living Expenses:   Food Insecurity:   . Worried About Charity fundraiser in the Last Year:   . Arboriculturist in the Last Year:   Transportation Needs:   . Film/video editor (Medical):   Marland Kitchen Lack of Transportation (Non-Medical):   Physical Activity:   . Days of Exercise per Week:   . Minutes of Exercise per Session:   Stress:   . Feeling of Stress :   Social Connections:   . Frequency of Communication with Friends and Family:   . Frequency of Social Gatherings with Friends and Family:   . Attends Religious Services:   . Active Member of Clubs or Organizations:   . Attends Archivist Meetings:   Marland Kitchen Marital Status:      Family History: The patient's family history includes Bradycardia in his maternal grandmother.  ROS:   Please see the history of present illness.     All other systems reviewed and are negative.  EKGs/Labs/Other Studies Reviewed:    The following  studies were reviewed today: TTE Jan 05, 2019 1. Significant LV and RV hypertrophy, concern for regions of noncompaction, consider additional imaging if clinically indicated.  2. Left ventricular ejection fraction, by visual estimation, is 45 to 50%. The left ventricle has mildly decreased function. There is mildly increased left ventricular hypertrophy in the basal regions, with moderate LVH in the mid to distal regions,  severe LVH in the inferior/lateral apical regions. Unable to exclude regions on noncompaction.  3. Mildly dilated left ventricular internal cavity size.  4. Left ventricular diastolic parameters are consistent with Grade II diastolic dysfunction (pseudonormalization).  5.  Global right ventricle has normal systolic function.The right ventricular size is normal. Moderately increased right ventricular wall thickness.  6. Left atrial size was moderately dilated.  7. There is mild dilatation of the aortic root measuring 39 mm.  8. Severely elevated pulmonary artery systolic pressure.  9. The inferior vena cava is dilated in size with <50% respiratory variability, suggesting right atrial pressure of 20 mmHg. 10. The tricuspid regurgitant velocity is 3.39 m/s, and with an assumed right atrial pressure of 20 mmHg, the estimated right ventricular systolic pressure is severely elevated at 66.0 mmHg.    Summary: Largest Aortic Diameter: 2.1 cm   Renal:   Right: Normal size right kidney. Abnormal right Resistive Index.        Abnormal cortical thickness of right kidney. No evidence of        right renal artery stenosis. RRV flow present. Cyst(s) noted.        Echogenic hyperechoic kidney parenchyma. There is absent        diastolic flow present throughout the segmental Dopplers. Left:  Normal size of left kidney. Abnormal left Resisitve Index.        Abnormal cortical thickness of the left kidney. No evidence        of left renal artery stenosis. LRV flow present. Cyst(s)         noted. Echogenic hyperechoic kidney parenchyma. There is        absent diastolic flow present throughout segmental Dopplers.   EKG:  EKG is  ordered today.  The ekg ordered today demonstrates normal sinus rhythm, possible LAE  Recent Labs: 03/06/2019: ALT 10; BUN 98; Creatinine, Ser 20.08; Magnesium 2.4; Potassium 5.7; Sodium 140 07/02/2019: Hemoglobin 8.9; Platelets 148  Recent Lipid Panel    Component Value Date/Time   CHOL 179 08/09/2011 1545   TRIG 89 08/09/2011 1545   HDL 54 08/09/2011 1545   CHOLHDL 3.3 08/09/2011 1545   VLDL 18 08/09/2011 1545   LDLCALC 107 (H) 08/09/2011 1545    Physical Exam:    VS:  BP (!) 182/100 (BP Location: Right Arm, Patient Position: Sitting, Cuff Size: Normal)   Pulse 92   Ht 6\' 2"  (1.88 m)   Wt 185 lb (83.9 kg)   SpO2 94%   BMI 23.75 kg/m     Wt Readings from Last 3 Encounters:  07/07/19 185 lb (83.9 kg)  07/04/19 180 lb (81.6 kg)  06/13/19 176 lb (79.8 kg)     GEN:  Well nourished, well developed in no acute distress HEENT: Normal NECK: No JVD; No carotid bruits LYMPHATICS: No lymphadenopathy CARDIAC: RRR, no murmurs, rubs, gallops, AV fistula bruit auscultated. RESPIRATORY:  Clear to auscultation without rales, wheezing or rhonchi  ABDOMEN: Soft, non-tender, non-distended MUSCULOSKELETAL:  No edema; No deformity  SKIN: Warm and dry NEUROLOGIC:  Alert and oriented x 3 PSYCHIATRIC:  Normal affect   ASSESSMENT:    1. Essential hypertension   2. Heart failure with reduced ejection fraction (HCC)   3. Pulmonary hypertension, unspecified ([place])    PLAN:     1. Difficult to control blood pressures.  BP still elevated .  Renal artery ultrasound did not show evidence for stenosis.  Chronic kidney disease possibly contributing.  Start clonidine patch 0.3 mg/24hr patch weekly.  Continue hydralazine 150 mg 3 times daily, amlodipine 10 mg daily.  Lasix 80 mg daily. 2. Mildly reduced ejection fraction, EF 45 to 50%.  MPI with no evidence  for ischemia.  Likely secondary to uncontrolled  hypertension.  BP management as above.  With hydralazine, clonidine, Norvasc.  Patient was previously prescribed Coreg and Imdur but stopped because he did not like how it made him feel.  Will counsel patient regarding restarting Coreg at follow-up visit if blood pressure still elevated. 3. Patient with dilated pulmonary artery noted on recent high-resolution CT consistent with chronic pulmonary artery hypertension.  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  Last transthoracic echo showed severely elevated RVSP, estimated value 61 mmHg.  Will refer patient to pulmonary hypertension clinic with Dr. Pierre Bali in [place].  Follow-up in 1 month  Total encounter time more than 40 minutes  Greater than 50% was spent in counseling and coordination of care with the patient Including medication management, and education regarding medication compliance.  This note was generated in part or whole with voice recognition software. Voice recognition is usually quite accurate but there are transcription errors that can and very often do occur. I apologize for any typographical errors that were not detected and corrected.   Medication Adjustments/Labs and Tests Ordered: Current medicines are reviewed at length with the patient today.  Concerns regarding medicines are outlined above.  Orders Placed This Encounter  Procedures  . AMB referral to CHF clinic  . EKG 12-Lead   Meds ordered this encounter  Medications  . cloNIDine (CATAPRES - DOSED IN MG/24 HR) 0.3 mg/24hr patch    Sig: Place 1 patch (0.3 mg total) onto the skin once a week.    Dispense:  4 patch    Refill:  5    Patient Instructions  Medication Instructions:  START Clonidine 0.3mg  patch to be applied to the skin once a week. An Rx has been sent to your pharmacy. Remember to rotate the application site weekly.  *If you need a refill on your cardiac medications before your next  appointment, please call your pharmacy*   Lab Work: None ordered If you have labs (blood work) drawn today and your tests are completely normal, you will receive your results only by: Marland Kitchen MyChart Message (if you have MyChart) OR . A paper copy in the mail If you have any lab test that is abnormal or we need to change your treatment, we will call you to review the results.   Testing/Procedures: None ordered   Follow-Up: At [facility], you and your health needs are our priority.  As part of our continuing mission to provide you with exceptional heart care, we have created designated Provider Care Teams.  These Care Teams include your primary Cardiologist (physician) and Advanced Practice Providers (APPs -  Physician Assistants and Nurse Practitioners) who all work together to provide you with the care you need, when you need it.  We recommend signing up for the patient portal called "MyChart".  Sign up information is provided on this After Visit Summary.  MyChart is used to connect with patients for Virtual Visits (Telemedicine).  Patients are able to view lab/test results, encounter notes, upcoming appointments, etc.  Non-urgent messages can be sent to your provider as well.   To learn more about what you can do with MyChart, go to [email or web address].    Your next appointment:   4 week(s)   You have been referred to Dr. Missy Sabins with the CHF clinic in [place]. Their office will contact you directly to schedule.   The format for your next appointment:   In Person  Provider:   Kate Sable, MD   Other Instructions N/A  Signed, Kate Sable, MD  07/07/2019 12:43 PM    [place]

## 2019-07-08 MED ORDER — ONDANSETRON 4 MG DISINTEGRATING TABLET
ORAL_TABLET | Freq: Three times a day (TID) | ORAL | 2 refills | 10 days | Status: CP | PRN
Start: 2019-07-08 — End: 2019-07-15

## 2019-07-09 DIAGNOSIS — N186 End stage renal disease: Secondary | ICD-10-CM | POA: Diagnosis not present

## 2019-07-09 DIAGNOSIS — Z992 Dependence on renal dialysis: Secondary | ICD-10-CM | POA: Diagnosis not present

## 2019-07-09 DIAGNOSIS — D631 Anemia in chronic kidney disease: Secondary | ICD-10-CM | POA: Diagnosis not present

## 2019-07-11 ENCOUNTER — Telehealth: Payer: Self-pay | Admitting: Internal Medicine

## 2019-07-11 DIAGNOSIS — D631 Anemia in chronic kidney disease: Secondary | ICD-10-CM | POA: Diagnosis not present

## 2019-07-11 DIAGNOSIS — Z992 Dependence on renal dialysis: Secondary | ICD-10-CM | POA: Diagnosis not present

## 2019-07-11 DIAGNOSIS — N186 End stage renal disease: Secondary | ICD-10-CM | POA: Diagnosis not present

## 2019-07-11 MED ORDER — AURYXIA 210 MG IRON TABLET
ORAL_TABLET | 5 refills | 0 days
Start: 2019-07-11 — End: ?

## 2019-07-11 NOTE — Telephone Encounter (Signed)
Dr Chase Caller has signed forms and I sent them back interoffice to [place]

## 2019-07-11 NOTE — Telephone Encounter (Signed)
Received FMLA forms and given to MR in [place] clinic 07/11/19  Thanks

## 2019-07-14 DIAGNOSIS — Z992 Dependence on renal dialysis: Secondary | ICD-10-CM | POA: Diagnosis not present

## 2019-07-14 DIAGNOSIS — N186 End stage renal disease: Secondary | ICD-10-CM | POA: Diagnosis not present

## 2019-07-14 DIAGNOSIS — D631 Anemia in chronic kidney disease: Secondary | ICD-10-CM | POA: Diagnosis not present

## 2019-07-15 ENCOUNTER — Encounter
Admit: 2019-07-15 | Discharge: 2019-07-15 | Disposition: A | Discharge status: Home / Self Care | Payer: MEDICARE | Attending: Emergency Medicine

## 2019-07-15 DIAGNOSIS — I132 Hypertensive heart and chronic kidney disease with heart failure and with stage 5 chronic kidney disease, or end stage renal disease: Secondary | ICD-10-CM | POA: Diagnosis not present

## 2019-07-15 DIAGNOSIS — K922 Gastrointestinal hemorrhage, unspecified: Secondary | ICD-10-CM | POA: Diagnosis not present

## 2019-07-15 DIAGNOSIS — N186 End stage renal disease: Secondary | ICD-10-CM | POA: Diagnosis not present

## 2019-07-15 DIAGNOSIS — I509 Heart failure, unspecified: Secondary | ICD-10-CM | POA: Diagnosis not present

## 2019-07-15 DIAGNOSIS — Z79899 Other long term (current) drug therapy: Secondary | ICD-10-CM | POA: Diagnosis not present

## 2019-07-15 DIAGNOSIS — Z992 Dependence on renal dialysis: Secondary | ICD-10-CM | POA: Diagnosis not present

## 2019-07-15 DIAGNOSIS — M109 Gout, unspecified: Secondary | ICD-10-CM | POA: Diagnosis not present

## 2019-07-15 DIAGNOSIS — Z888 Allergy status to other drugs, medicaments and biological substances status: Secondary | ICD-10-CM | POA: Diagnosis not present

## 2019-07-16 DIAGNOSIS — N186 End stage renal disease: Secondary | ICD-10-CM | POA: Diagnosis not present

## 2019-07-16 DIAGNOSIS — D631 Anemia in chronic kidney disease: Secondary | ICD-10-CM | POA: Diagnosis not present

## 2019-07-16 DIAGNOSIS — Z23 Encounter for immunization: Secondary | ICD-10-CM | POA: Diagnosis not present

## 2019-07-16 DIAGNOSIS — Z992 Dependence on renal dialysis: Secondary | ICD-10-CM | POA: Diagnosis not present

## 2019-07-18 ENCOUNTER — Non-Acute Institutional Stay: Admit: 2019-07-18 | Discharge: 2019-07-19 | Payer: MEDICARE

## 2019-07-18 ENCOUNTER — Encounter: Admit: 2019-07-18 | Discharge: 2019-07-19 | Payer: MEDICARE

## 2019-07-18 DIAGNOSIS — R0600 Dyspnea, unspecified: Secondary | ICD-10-CM | POA: Diagnosis not present

## 2019-07-18 DIAGNOSIS — N186 End stage renal disease: Secondary | ICD-10-CM | POA: Diagnosis not present

## 2019-07-18 MED ORDER — AURYXIA 210 MG IRON TABLET
ORAL_TABLET | Freq: Three times a day (TID) | ORAL | 11 refills | 30.00000 days | Status: CP
Start: 2019-07-18 — End: 2020-07-17

## 2019-07-19 DIAGNOSIS — Z23 Encounter for immunization: Secondary | ICD-10-CM | POA: Diagnosis not present

## 2019-07-19 DIAGNOSIS — D631 Anemia in chronic kidney disease: Secondary | ICD-10-CM | POA: Diagnosis not present

## 2019-07-19 DIAGNOSIS — Z992 Dependence on renal dialysis: Secondary | ICD-10-CM | POA: Diagnosis not present

## 2019-07-19 DIAGNOSIS — N186 End stage renal disease: Secondary | ICD-10-CM | POA: Diagnosis not present

## 2019-07-21 DIAGNOSIS — D631 Anemia in chronic kidney disease: Secondary | ICD-10-CM | POA: Diagnosis not present

## 2019-07-21 DIAGNOSIS — Z992 Dependence on renal dialysis: Secondary | ICD-10-CM | POA: Diagnosis not present

## 2019-07-21 DIAGNOSIS — Z23 Encounter for immunization: Secondary | ICD-10-CM | POA: Diagnosis not present

## 2019-07-21 DIAGNOSIS — N186 End stage renal disease: Secondary | ICD-10-CM | POA: Diagnosis not present

## 2019-07-22 MED ORDER — SEVELAMER CARBONATE 800 MG TABLET
ORAL_TABLET | Freq: Three times a day (TID) | ORAL | 0 refills | 30 days
Start: 2019-07-22 — End: 2020-07-21

## 2019-07-23 ENCOUNTER — Telehealth: Payer: Self-pay | Admitting: Internal Medicine

## 2019-07-23 ENCOUNTER — Telehealth: Payer: Self-pay | Admitting: Cardiology

## 2019-07-23 DIAGNOSIS — Z23 Encounter for immunization: Secondary | ICD-10-CM | POA: Diagnosis not present

## 2019-07-23 DIAGNOSIS — H524 Presbyopia: Secondary | ICD-10-CM | POA: Diagnosis not present

## 2019-07-23 DIAGNOSIS — D631 Anemia in chronic kidney disease: Secondary | ICD-10-CM | POA: Diagnosis not present

## 2019-07-23 DIAGNOSIS — N186 End stage renal disease: Secondary | ICD-10-CM | POA: Diagnosis not present

## 2019-07-23 DIAGNOSIS — Z992 Dependence on renal dialysis: Secondary | ICD-10-CM | POA: Diagnosis not present

## 2019-07-23 NOTE — Telephone Encounter (Signed)
Patient calling  Wants to discuss status of referral to pulmonary  States that he no longer has the number  Please call to discuss

## 2019-07-23 NOTE — Telephone Encounter (Signed)
Spoke with the patient. Patient was referred to the CHF/pulmonary htn clinic in [place]. Advised the patient that the referral was placed and they will contact him directly to schedule. Provided the patient the CHF telephone number as he would like to call them to check the status.

## 2019-07-24 DIAGNOSIS — J449 Chronic obstructive pulmonary disease, unspecified: Secondary | ICD-10-CM | POA: Diagnosis not present

## 2019-07-24 NOTE — Telephone Encounter (Signed)
My plan was to consider the bronchoscopy only after right heart catheterization.  Therefore I had him see the [facility] cardiologist.  However the [facility] cardiologist has referred him to Dr. Haroldine Laws in [place] for right heart catheterization.  The visit is scheduled for October 07, 2019.  I do not want to do the bronchoscopy till the right heart catheterization is completed.  By the time he has his heart catheterization my it might be September  Plan -Cancel August 12, 2019 visit with me because that was to discuss his cardiology visit -Instead give him a July 2021 [place] visit -Regarding his heart catheterization [right heart] -> you can see if Dr. Einar Gip can see him sooner or Dr. Loralie Champagne or Dr. Orene Desanctis or [place] or Dr Gwenlyn Found (I think they all do Right heart cath) if the patient cannot wait till October 07, 2019 to see Dr Jeffie Pollock

## 2019-07-25 NOTE — Telephone Encounter (Signed)
Spoke with the pt and notified of recs per MR  He is refusing to cancel the appt 08/12/19  Wants to discuss returning to work  Did not want to wait until July  Appt was not cancelled bc of this

## 2019-07-26 DIAGNOSIS — D631 Anemia in chronic kidney disease: Secondary | ICD-10-CM | POA: Diagnosis not present

## 2019-07-26 DIAGNOSIS — Z23 Encounter for immunization: Secondary | ICD-10-CM | POA: Diagnosis not present

## 2019-07-26 DIAGNOSIS — Z992 Dependence on renal dialysis: Secondary | ICD-10-CM | POA: Diagnosis not present

## 2019-07-26 DIAGNOSIS — N186 End stage renal disease: Secondary | ICD-10-CM | POA: Diagnosis not present

## 2019-07-28 ENCOUNTER — Other Ambulatory Visit: Payer: Self-pay | Admitting: Family Medicine

## 2019-07-28 DIAGNOSIS — N186 End stage renal disease: Secondary | ICD-10-CM | POA: Diagnosis not present

## 2019-07-28 DIAGNOSIS — Z23 Encounter for immunization: Secondary | ICD-10-CM | POA: Diagnosis not present

## 2019-07-28 DIAGNOSIS — M109 Gout, unspecified: Secondary | ICD-10-CM

## 2019-07-28 DIAGNOSIS — D631 Anemia in chronic kidney disease: Secondary | ICD-10-CM | POA: Diagnosis not present

## 2019-07-28 DIAGNOSIS — Z992 Dependence on renal dialysis: Secondary | ICD-10-CM | POA: Diagnosis not present

## 2019-07-28 MED ORDER — METHOCARBAMOL 500 MG PO TABS: 500.0000 mg | ORAL_TABLET | Freq: Two times a day (BID) | ORAL | 0 refills | Status: DC | PRN

## 2019-07-28 MED ORDER — PREDNISONE 10 MG PO TABS: 10.0000 mg | ORAL_TABLET | Freq: Two times a day (BID) | ORAL | 0 refills | Status: DC | PRN

## 2019-07-28 MED ORDER — PREDNISONE 10 MG TABLET
ORAL | 0 days
Start: 2019-07-28 — End: ?

## 2019-07-28 MED ORDER — METHOCARBAMOL 500 MG TABLET
ORAL | 0 days
Start: 2019-07-28 — End: ?

## 2019-07-28 NOTE — Telephone Encounter (Signed)
Pt request refill  predniSONE (DELTASONE) 10 MG tablet  methocarbamol (ROBAXIN) 500 MG tablet  Pt states Dr Ancil Boozer knows he needs these meds, and he just calls when he wants refill on these.  [id number] - [place], [place] - [address] Phone:  [phone]  Fax:  [phone]

## 2019-07-30 DIAGNOSIS — N186 End stage renal disease: Secondary | ICD-10-CM | POA: Diagnosis not present

## 2019-07-30 DIAGNOSIS — Z992 Dependence on renal dialysis: Secondary | ICD-10-CM | POA: Diagnosis not present

## 2019-07-30 DIAGNOSIS — D631 Anemia in chronic kidney disease: Secondary | ICD-10-CM | POA: Diagnosis not present

## 2019-07-30 DIAGNOSIS — Z23 Encounter for immunization: Secondary | ICD-10-CM | POA: Diagnosis not present

## 2019-08-01 ENCOUNTER — Ambulatory Visit: Admit: 2019-08-01 | Discharge: 2019-08-02 | Payer: MEDICARE

## 2019-08-01 DIAGNOSIS — D631 Anemia in chronic kidney disease: Secondary | ICD-10-CM | POA: Diagnosis not present

## 2019-08-01 DIAGNOSIS — Z23 Encounter for immunization: Secondary | ICD-10-CM | POA: Diagnosis not present

## 2019-08-01 DIAGNOSIS — I272 Pulmonary hypertension, unspecified: Secondary | ICD-10-CM | POA: Diagnosis not present

## 2019-08-01 DIAGNOSIS — I10 Essential (primary) hypertension: Secondary | ICD-10-CM | POA: Diagnosis not present

## 2019-08-01 DIAGNOSIS — N186 End stage renal disease: Secondary | ICD-10-CM | POA: Diagnosis not present

## 2019-08-01 DIAGNOSIS — J449 Chronic obstructive pulmonary disease, unspecified: Secondary | ICD-10-CM | POA: Diagnosis not present

## 2019-08-01 DIAGNOSIS — R0600 Dyspnea, unspecified: Secondary | ICD-10-CM | POA: Diagnosis not present

## 2019-08-01 DIAGNOSIS — Z992 Dependence on renal dialysis: Secondary | ICD-10-CM | POA: Diagnosis not present

## 2019-08-04 ENCOUNTER — Encounter
Admit: 2019-08-04 | Discharge: 2019-08-05 | Payer: MEDICARE | Attending: Student in an Organized Health Care Education/Training Program | Primary: Student in an Organized Health Care Education/Training Program

## 2019-08-04 DIAGNOSIS — D631 Anemia in chronic kidney disease: Secondary | ICD-10-CM | POA: Diagnosis not present

## 2019-08-04 DIAGNOSIS — Z23 Encounter for immunization: Secondary | ICD-10-CM | POA: Diagnosis not present

## 2019-08-04 DIAGNOSIS — N186 End stage renal disease: Secondary | ICD-10-CM | POA: Diagnosis not present

## 2019-08-04 DIAGNOSIS — Z992 Dependence on renal dialysis: Secondary | ICD-10-CM | POA: Diagnosis not present

## 2019-08-04 DIAGNOSIS — L409 Psoriasis, unspecified: Principal | ICD-10-CM

## 2019-08-05 DIAGNOSIS — B20 Human immunodeficiency virus [HIV] disease: Principal | ICD-10-CM

## 2019-08-06 ENCOUNTER — Ambulatory Visit: Payer: PRIVATE HEALTH INSURANCE | Admitting: Gastroenterology

## 2019-08-06 DIAGNOSIS — D631 Anemia in chronic kidney disease: Secondary | ICD-10-CM | POA: Diagnosis not present

## 2019-08-06 DIAGNOSIS — Z992 Dependence on renal dialysis: Secondary | ICD-10-CM | POA: Diagnosis not present

## 2019-08-06 DIAGNOSIS — N186 End stage renal disease: Secondary | ICD-10-CM | POA: Diagnosis not present

## 2019-08-06 DIAGNOSIS — Z23 Encounter for immunization: Secondary | ICD-10-CM | POA: Diagnosis not present

## 2019-08-08 ENCOUNTER — Other Ambulatory Visit: Payer: Self-pay

## 2019-08-08 ENCOUNTER — Ambulatory Visit (INDEPENDENT_AMBULATORY_CARE_PROVIDER_SITE_OTHER): Payer: Medicare Other | Admitting: Family Medicine

## 2019-08-08 ENCOUNTER — Encounter: Payer: Self-pay | Admitting: Family Medicine

## 2019-08-08 VITALS — BP 160/80 | HR 85 | Temp 97.6°F | Resp 16 | Ht 74.0 in | Wt 185.6 lb

## 2019-08-08 DIAGNOSIS — J432 Centrilobular emphysema: Secondary | ICD-10-CM | POA: Diagnosis not present

## 2019-08-08 DIAGNOSIS — Z992 Dependence on renal dialysis: Secondary | ICD-10-CM | POA: Diagnosis not present

## 2019-08-08 DIAGNOSIS — D500 Iron deficiency anemia secondary to blood loss (chronic): Secondary | ICD-10-CM

## 2019-08-08 DIAGNOSIS — I10 Essential (primary) hypertension: Secondary | ICD-10-CM | POA: Diagnosis not present

## 2019-08-08 DIAGNOSIS — D631 Anemia in chronic kidney disease: Secondary | ICD-10-CM | POA: Diagnosis not present

## 2019-08-08 DIAGNOSIS — R0602 Shortness of breath: Secondary | ICD-10-CM

## 2019-08-08 DIAGNOSIS — N186 End stage renal disease: Secondary | ICD-10-CM

## 2019-08-08 DIAGNOSIS — Z21 Asymptomatic human immunodeficiency virus [HIV] infection status: Secondary | ICD-10-CM

## 2019-08-08 DIAGNOSIS — M109 Gout, unspecified: Secondary | ICD-10-CM | POA: Diagnosis not present

## 2019-08-08 DIAGNOSIS — I120 Hypertensive chronic kidney disease with stage 5 chronic kidney disease or end stage renal disease: Secondary | ICD-10-CM

## 2019-08-08 DIAGNOSIS — L409 Psoriasis, unspecified: Secondary | ICD-10-CM

## 2019-08-08 DIAGNOSIS — Z23 Encounter for immunization: Secondary | ICD-10-CM | POA: Diagnosis not present

## 2019-08-08 DIAGNOSIS — I700 Atherosclerosis of aorta: Secondary | ICD-10-CM | POA: Diagnosis not present

## 2019-08-08 NOTE — Progress Notes (Signed)
Name: Louis [name]   MRN: [id number]    DOB: 1960/12/27   Date:08/08/2019       Progress Note  Subjective  Chief Complaint  Chief Complaint  Patient presents with  . Hypertension    HPI  SOB: he is now seeing Dr. Joya Salm and he was advised to follow up with cardiologist for a cardiac cath before he proceeds with a bronchoscopy. He has an appointment with cardiologist at [facility] , he also sees cardiologist at [facility] and now also at [facility]. SOB with activity only, no cough or wheezing, has orthopnea, mild lower extremity edema He wants to feel better, under disability since January 2021  Anemia chronic disease plus iron deficiency anemia: seeing [facility]  and had colonoscopy and EGD on 07/01/19  and is going for capsule colonoscopy next week, he had a drop of HCT and looking for the cause. No abdominal pain, his stools are always black , he also had some vomiting but that has resolved with zofran.   ESRD: he is currently going to [facility] , he is on transplant list , bp still elevated, states could not tolerate spironolactone - caused hallucinations but states bp not as bad since on higher dose clonidine patch. He goes for HD at home through his fistula. No longer has pruritis since psoriasis under control  Psoriasis: he is seeing Dermatologist , being treated with phototherapy and also topical medication   HIV: goes to a clinic on [address], he states keeps his follows up, takes medication daily as prescribed, unable to see notes but he states his titers are good. No weight loss, no thrush, no new rashes. Appetite is good   HTN in a patient on ESRD: managed by nephrologist, bp still high today   Gout: he takes prednisone prn for out, usually on his wrist ( right ), no recent episodes on his legs   Patient Active Problem List   Diagnosis Date Noted  . Other disorders of phosphorus metabolism 05/05/2019  . Anemia in chronic kidney disease 04/30/2019  . Coagulation defect, unspecified ([place])  04/22/2019  . Iron deficiency anemia, unspecified 04/21/2019  . Hypertensive urgency 03/05/2019  . Prolonged Q-T interval on ECG 06/15/2018  . Mild protein-calorie malnutrition ([place]) 05/01/2018  . Eczema 01/04/2018  . History of anemia due to CKD 05/24/2017  . ESRD on dialysis ([place]) 12/19/2016  . 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 Baker's cyst of knee, right 05/17/2016  . Neck muscle spasm 03/16/2016  . Hypertension 06/23/2010  . Rosacea 06/23/2010  . Human immunodeficiency virus (HIV) disease ([place]) 01/02/1996    Past Surgical History:  Procedure Laterality Date  . CAPD INSERTION N/A 12/28/2016   Procedure: LAPAROSCOPIC INSERTION CONTINUOUS AMBULATORY PERITONEAL DIALYSIS  (CAPD) CATHETER;  Surgeon: Algernon Huxley, MD;  Location: [facility] ORS;  Service: Vascular;  Laterality: N/A;  . DIALYSIS/PERMA CATHETER INSERTION N/A 10/09/2016   Procedure: DIALYSIS/PERMA CATHETER INSERTION;  Surgeon: Algernon Huxley, MD;  Location: [place] [facility];  Service: Cardiovascular;  Laterality: N/A;  . DIALYSIS/PERMA CATHETER REMOVAL N/A 02/19/2017   Procedure: DIALYSIS/PERMA CATHETER REMOVAL;  Surgeon: Algernon Huxley, MD;  Location: [place] [facility];  Service: Cardiovascular;  Laterality: N/A;  . HERNIA REPAIR Bilateral    inguinal    Family History  Problem Relation Age of Onset  . Bradycardia Maternal Grandmother        with pacemaker    Social History   Tobacco Use  . Smoking status: Never Smoker  .  Smokeless tobacco: Never Used  Substance Use Topics  . Alcohol use: Not Currently    Alcohol/week: 2.0 standard drinks    Types: 2 Standard drinks or equivalent per week    Comment: has not had any alcohol in a while     Current Outpatient Medications:  .  amLODipine (NORVASC) 10 MG tablet, Take 1 tablet (10 mg total) by mouth daily., Disp: 30 tablet, Rfl: 0 .  AURYXIA 1 GM 210 MG(Fe) tablet, Take 1 tablet by mouth 4 (four) times daily -  before meals and at bedtime., Disp: ,  Rfl:  .  B Complex-C-Folic Acid (RENA-VITE RX) 1 MG TABS, Take 1 tablet by mouth daily., Disp: , Rfl:  .  budesonide-formoterol (SYMBICORT) 80-4.5 MCG/ACT inhaler, Inhale 2 puffs into the lungs 2 (two) times daily., Disp: 1 Inhaler, Rfl: 12 .  cetirizine (ZYRTEC) 10 MG tablet, Take 1 tablet (10 mg total) by mouth daily., Disp: 30 tablet, Rfl: 11 .  cloNIDine (CATAPRES - DOSED IN MG/24 HR) 0.3 mg/24hr patch, Place 1 patch (0.3 mg total) onto the skin once a week., Disp: 4 patch, Rfl: 5 .  DESCOVY 200-25 MG tablet, Take 1 tablet by mouth daily., Disp: , Rfl:  .  diclofenac sodium (VOLTAREN) 1 % GEL, Apply 2 g topically 4 (four) times daily., Disp: 100 g, Rfl: 0 .  Febuxostat 80 MG TABS, Take 1 tablet by mouth daily., Disp: , Rfl:  .  ferrous sulfate 325 (65 FE) MG tablet, Take 1 tablet by mouth daily., Disp: , Rfl:  .  fluticasone (FLONASE) 50 MCG/ACT nasal spray, Place into the nose., Disp: , Rfl:  .  furosemide (LASIX) 40 MG tablet, Take 80 mg by mouth daily. , Disp: , Rfl:  .  halobetasol (ULTRAVATE) 0.05 % cream, APPLY TO AFFECTED AREA TWICE A DAY, Disp: , Rfl:  .  hydrALAZINE (APRESOLINE) 100 MG tablet, Take 150 mg by mouth 3 (three) times daily., Disp: , Rfl:  .  methocarbamol (ROBAXIN) 500 MG tablet, Take 1 tablet (500 mg total) by mouth 2 (two) times daily as needed. for pain, Disp: 60 tablet, Rfl: 0 .  predniSONE (DELTASONE) 10 MG tablet, Take 1 tablet (10 mg total) by mouth 2 (two) times daily as needed. Prn gout attack for 2 days, Disp: 10 tablet, Rfl: 0 .  SPIRIVA RESPIMAT 1.25 MCG/ACT AERS, INHALE 2 PUFFS BY MOUTH INTO THE LUNGS DAILY, Disp: 4 g, Rfl: 3 .  TIVICAY 50 MG tablet, Take 1 tablet by mouth daily., Disp: , Rfl:  .  valACYclovir (VALTREX) 500 MG tablet, Take 500 mg by mouth daily as needed (for flare ups). , Disp: , Rfl:  .  carvedilol (COREG) 25 MG tablet, Take 1 tablet (25 mg total) by mouth 2 (two) times daily., Disp: 180 tablet, Rfl: 3  Allergies  Allergen Reactions  .  Losartan Swelling    Lip swelling  . Quinapril Hcl Swelling    Face swells  . Spironolactone Other (See Comments)    Other reaction(s): hallucinations    I personally reviewed active problem list, medication list, allergies, family history, social history, health maintenance with the patient/caregiver today.   ROS  Constitutional: Negative for fever , positive for weight change.  Respiratory: Negative for cough , positive for shortness of breath.   Cardiovascular: Negative for chest pain or palpitations.  Gastrointestinal: Negative for abdominal pain, no bowel changes.  Musculoskeletal: Negative for gait problem or joint swelling.  Skin: Negative for rash.  Neurological: Negative  for dizziness or headache.  No other specific complaints in a complete review of systems (except as listed in HPI above).  Objective  Vitals:   08/08/19 1344  BP: (!) 160/80  Pulse: 85  Resp: 16  Temp: 97.6 F (36.4 C)  TempSrc: Temporal  SpO2: 94%  Weight: 185 lb 9.6 oz (84.2 kg)  Height: 6\' 2"  (5.46 m)    Body mass index is 23.83 kg/m.  Physical Exam  Constitutional: Patient appears well-developed and well-nourished.  No distress.  HEENT: head atraumatic, normocephalic, pupils equal and reactive to light,neck supple Cardiovascular: Normal rate, regular rhythm and normal heart sounds.  No murmur heard. No BLE edema. Pulmonary/Chest: Effort normal and breath sounds normal. No respiratory distress. Abdominal: Soft.  There is no tenderness. Skin: Fistula left arm, no rashes  Psychiatric: Patient has a normal mood and affect. behavior is normal. Judgment and thought content normal . Recent Results (from the past 2160 hour(s))  SARS CORONAVIRUS 2 (TAT 6-24 HRS) Nasopharyngeal Nasopharyngeal Swab     Status: None   Collection Time: 05/19/19  9:37 AM   Specimen: Nasopharyngeal Swab  Result Value Ref Range   SARS Coronavirus 2 NEGATIVE NEGATIVE    Comment: (NOTE) SARS-CoV-2 target nucleic  acids are NOT DETECTED. The SARS-CoV-2 RNA is generally detectable in upper and lower respiratory specimens during the acute phase of infection. Negative results do not preclude SARS-CoV-2 infection, do not rule out co-infections with other pathogens, and should not be used as the sole basis for treatment or other patient management decisions. Negative results must be combined with clinical observations, patient history, and epidemiological information. The expected result is Negative. Fact Sheet for Patients: SugarRoll.be Fact Sheet for Healthcare Providers: https://www.woods-mathews.com/ This test is not yet approved or cleared by the [place] FDA and  has been authorized for detection and/or diagnosis of SARS-CoV-2 by FDA under an Emergency Use Authorization (EUA). This EUA will remain  in effect (meaning this test can be used) for the duration of the COVID-19 declaration under Section 56 4(b)(1) of the Act, 21 U.S.C. section 360bbb-3(b)(1), unless the authorization is terminated or revoked sooner. Performed at [facility], [place] [address], [place], [place] [address]   SARS CORONAVIRUS 2 (TAT 6-24 HRS) Nasopharyngeal Nasopharyngeal Swab     Status: None   Collection Time: 06/02/19  9:48 AM   Specimen: Nasopharyngeal Swab  Result Value Ref Range   SARS Coronavirus 2 NEGATIVE NEGATIVE    Comment: (NOTE) SARS-CoV-2 target nucleic acids are NOT DETECTED. The SARS-CoV-2 RNA is generally detectable in upper and lower respiratory specimens during the acute phase of infection. Negative results do not preclude SARS-CoV-2 infection, do not rule out co-infections with other pathogens, and should not be used as the sole basis for treatment or other patient management decisions. Negative results must be combined with clinical observations, patient history, and epidemiological information. The expected result is Negative. Fact Sheet for  Patients: SugarRoll.be Fact Sheet for Healthcare Providers: https://www.woods-mathews.com/ This test is not yet approved or cleared by the [place] FDA and  has been authorized for detection and/or diagnosis of SARS-CoV-2 by FDA under an Emergency Use Authorization (EUA). This EUA will remain  in effect (meaning this test can be used) for the duration of the COVID-19 declaration under Section 56 4(b)(1) of the Act, 21 U.S.C. section 360bbb-3(b)(1), unless the authorization is terminated or revoked sooner. Performed at [facility], [place] [address] [place], [place] [address]   CBC with  Differential/Platelet     Status: Abnormal   Collection Time: 06/09/19  3:55 PM  Result Value Ref Range   WBC 6.1 4.0 - 10.5 K/uL   RBC 1.69 (L) 4.22 - 5.81 MIL/uL   Hemoglobin 5.7 (L) 13.0 - 17.0 g/dL   HCT 16.3 (L) 39 - 52 %   MCV 96.4 80.0 - 100.0 fL   MCH 33.7 26.0 - 34.0 pg   MCHC 35.0 30.0 - 36.0 g/dL   RDW 17.9 (H) 11.5 - 15.5 %   Platelets 111 (L) 150 - 400 K/uL    Comment: Immature Platelet Fraction may be clinically indicated, consider ordering this additional test [id number]    nRBC 0.0 0.0 - 0.2 %   Neutrophils Relative % 71 %   Neutro Abs 4.3 1.7 - 7.7 K/uL   Lymphocytes Relative 19 %   Lymphs Abs 1.2 0.7 - 4.0 K/uL   Monocytes Relative 8 %   Monocytes Absolute 0.5 0 - 1 K/uL   Eosinophils Relative 2 %   Eosinophils Absolute 0.1 0 - 0 K/uL   Basophils Relative 0 %   Basophils Absolute 0.0 0 - 0 K/uL   Immature Granulocytes 0 %   Abs Immature Granulocytes 0.02 0.00 - 0.07 K/uL    Comment: Performed at [facility], [address] [place], [place] [address]  CBC with Differential/Platelet     Status: Abnormal   Collection Time: 07/02/19  2:00 PM  Result Value Ref Range   WBC 6.9 4.0 - 10.5 K/uL   RBC 2.56 (L) 4.22 - 5.81 MIL/uL   Hemoglobin 8.9 (L) 13.0 - 17.0 g/dL   HCT 26.6 (L) 39 - 52 %   MCV 103.9 (H) 80.0 -  100.0 fL   MCH 34.8 (H) 26.0 - 34.0 pg   MCHC 33.5 30.0 - 36.0 g/dL   RDW 17.2 (H) 11.5 - 15.5 %   Platelets 148 (L) 150 - 400 K/uL   nRBC 0.0 0.0 - 0.2 %   Neutrophils Relative % 70 %   Neutro Abs 4.9 1.7 - 7.7 K/uL   Lymphocytes Relative 15 %   Lymphs Abs 1.0 0.7 - 4.0 K/uL   Monocytes Relative 11 %   Monocytes Absolute 0.8 0 - 1 K/uL   Eosinophils Relative 3 %   Eosinophils Absolute 0.2 0 - 0 K/uL   Basophils Relative 0 %   Basophils Absolute 0.0 0 - 0 K/uL   Immature Granulocytes 1 %   Abs Immature Granulocytes 0.04 0.00 - 0.07 K/uL    Comment: Performed at [facility], [address] [place], [place] [address]     PHQ2/9: Depression screen [facility] 2/9 08/08/2019 04/28/2019 04/21/2019 01/21/2019 09/05/2018  Decreased Interest 0 0 0 0 0  Down, Depressed, Hopeless 0 0 0 0 0  PHQ - 2 Score 0 0 0 0 0  Altered sleeping 0 1 1 0 0  Tired, decreased energy 0 1 2 0 0  Change in appetite 0 0 0 0 0  Feeling bad or failure about yourself  0 0 0 0 0  Trouble concentrating 0 0 0 0 0  Moving slowly or fidgety/restless 0 0 0 0 0  Suicidal thoughts 0 0 0 0 0  PHQ-9 Score 0 2 3 0 0  Difficult doing work/chores - Not difficult at all Not difficult at all - Not difficult at all  Some recent data might be hidden    phq 9 is negative   Fall Risk:  Fall Risk  08/08/2019 04/22/2019 04/21/2019 09/05/2018 08/19/2018  Falls in the past year? 0 0 0 0 0  Number falls in past yr: 0 - 0 0 0  Injury with Fall? 0 - 0 0 0  Comment - - - - -  Follow up - Falls evaluation completed Falls evaluation completed - -     Assessment & Plan  1. ESRD on dialysis [facility])  He is doing HD at home, fistula on left arm is working well   2. Controlled gout  He still has some prednisone at home that he takes prn   3. Uncontrolled hypertension  Managed by nephrologist   4. Centrilobular emphysema ([place])  Under the care of pulmonologist at [facility], waiting to have hear cath prior to bronchoscopy   5.  Atherosclerosis of aorta (HCC)  Continue statin   6. Psoriasis  Seeing Dermatologist at [facility], doing better with phototherapy. He did not bring his medications, but medication reconciliation was done with cma  7. Asymptomatic HIV infection ([place])  Under the care of ID but I am unable to see notes  8. Iron deficiency anemia due to chronic blood loss  Seeing GI, had recent EGD and colonoscopy that was normal, going for capsule endoscopy   9. SOB (shortness of breath)  Stable  10. Benign hypertension with ESRD (end-stage renal disease) ([place])

## 2019-08-11 ENCOUNTER — Encounter: Admit: 2019-08-11 | Discharge: 2019-08-11 | Payer: MEDICARE

## 2019-08-11 ENCOUNTER — Telehealth: Payer: Self-pay

## 2019-08-11 DIAGNOSIS — N186 End stage renal disease: Secondary | ICD-10-CM | POA: Diagnosis not present

## 2019-08-11 DIAGNOSIS — Z992 Dependence on renal dialysis: Secondary | ICD-10-CM | POA: Diagnosis not present

## 2019-08-11 DIAGNOSIS — D509 Iron deficiency anemia, unspecified: Secondary | ICD-10-CM | POA: Diagnosis not present

## 2019-08-11 DIAGNOSIS — K3189 Other diseases of stomach and duodenum: Secondary | ICD-10-CM | POA: Diagnosis not present

## 2019-08-11 DIAGNOSIS — D631 Anemia in chronic kidney disease: Secondary | ICD-10-CM | POA: Diagnosis not present

## 2019-08-11 DIAGNOSIS — Z23 Encounter for immunization: Secondary | ICD-10-CM | POA: Diagnosis not present

## 2019-08-11 NOTE — Telephone Encounter (Signed)
Copied from [facility] [place] [phone]. Topic: General - Other >> Aug 08, 2019  3:03 PM Antonieta Iba C wrote: Reason for CRM: pt called in to provide the name of the provider that they discussed. Cecile Hearing. Pt says that she is located on [address]. Pt says that provider would know who he is speaking of.

## 2019-08-12 ENCOUNTER — Encounter: Payer: Self-pay | Admitting: Internal Medicine

## 2019-08-12 ENCOUNTER — Ambulatory Visit (INDEPENDENT_AMBULATORY_CARE_PROVIDER_SITE_OTHER): Payer: Medicare Other | Admitting: Internal Medicine

## 2019-08-12 ENCOUNTER — Other Ambulatory Visit: Payer: Self-pay

## 2019-08-12 ENCOUNTER — Telehealth: Payer: Self-pay | Admitting: Internal Medicine

## 2019-08-12 DIAGNOSIS — N186 End stage renal disease: Secondary | ICD-10-CM | POA: Diagnosis not present

## 2019-08-12 DIAGNOSIS — R0600 Dyspnea, unspecified: Secondary | ICD-10-CM

## 2019-08-12 DIAGNOSIS — B20 Human immunodeficiency virus [HIV] disease: Secondary | ICD-10-CM | POA: Diagnosis not present

## 2019-08-12 DIAGNOSIS — Z992 Dependence on renal dialysis: Secondary | ICD-10-CM | POA: Diagnosis not present

## 2019-08-12 DIAGNOSIS — Z23 Encounter for immunization: Secondary | ICD-10-CM | POA: Diagnosis not present

## 2019-08-12 DIAGNOSIS — D631 Anemia in chronic kidney disease: Secondary | ICD-10-CM | POA: Diagnosis not present

## 2019-08-12 NOTE — Telephone Encounter (Signed)
Louis [name]  AS discussed Louis [name] he has HIV and evidence of high pressures and low LVEF -. His official appt is 10/07/19 but thanks for seeing him sooner directly for cath . He is waiting to hear from [facility] about RHC/LHC which was recommended to him there as well.  Thanks    SIGNATURE    Dr. Brand Males, M.D., F.C.C.P,  Pulmonary and Critical Care Medicine Staff Physician, [place] Director - Interstitial Lung Disease  Program  Pulmonary [place] [facility] [place], [place], [address]  Pager: [phone], If no answer or between  15:00h - 7:00h: call [phone] Telephone: [phone]  10:49 AM 08/12/2019

## 2019-08-12 NOTE — Progress Notes (Signed)
OV 01/31/2019  Subjective:  Patient ID: Louis [name], male , DOB: 04-15-1960 , age 59 y.o. , MRN: [id number] , ADDRESS: [place] [address] PCP Steele Sizer, MD   01/31/2019 -   Chief Complaint  Patient presents with  . pulmonary consult    per Dr. Ancil Boozer. dx with PNA 10/2018. since he has had sob with exertion.      HPI Louis [name] 59 y.o. -referred by primary care physician for shortness of breath on exertion relieved by rest.  He tells me that he has end-stage renal disease for the last 2 years.  He says at baseline he was doing really well with an excellent quality of life and functional status.  Then in September 2020 he had cough and shortness of breath acutely.  This was diagnosis pneumonia accordingly to him.  He was given short course antibiotics.  He says that he does not recollect if he had a fever.  And then after that a lot of the symptoms improved except the shortness of breath.  This is present on exertion relieved by rest.  It is mild to moderate in severity.  It is not progressive since it started.  However it is limiting him from doing exercises.  He says he had a cardiac work-up and therefore he was cleared.  Review of cardiac work-up indicates that his stress test on January 13, 2019 was low risk but his ejection fraction is 45-50%.  He is not aware he has chronic systolic dysfunction.  He has blood work in April 2020 showing he is anemic with a hemoglobin in the 9 g% range.  This is worse than before.  He is not aware that he is anemic.  He denies any current cough wheezing, orthopnea, proximal nocturnal dyspnea, edema.  He goes for Monday Wednesday Friday dialysis.  He says after the dialysis he feels better.  He says the shortness of breath is associated with lower chest tightness especially in the infrascapular region.  His walking desaturation test today was fine.    CXR Oct 2020 - personally visualized - clear (CT abd lung cut  2014 - LL atelectasis v scarrring)   ROS - per HPI     OV 02/21/2019 - telephone visit  Subjective:  Patient ID: Louis [name], male , DOB: 1960-09-18 , age 19 y.o. , MRN: [id number] , ADDRESS: [place] [address]   02/21/2019 -  telephon visit to discuss HRCT/cbc results in setting of ESRD x 3 years, chronicy systolic chf,  Anemia (on iron since 3 years) , crackles and symptoms of dyspnea.     HPI [name] 59 y.o. -in this telephone visit the risks, benefits and limitations of the telephone visit were discussed.  The purpose of this visit is to discuss this dyspnea work-up in terms of the results of his pulmonary function tests, CBC and also high-resolution CT chest.  The pulmonary function test clearly shows restriction with reduction in diffusion capacity suggestive of ILD.  The high-resolution CT chest suggests he might have ILD.  Although personal visualization it looks to me that he might have post pneumonia atelectasis but certainly ILD cannot be ruled out.  I agree with the radiologist on this.  If this were UIP it would be indeterminate which means lower than 50% probably of UIP.  His CBC shows worsening anemia.  He tells me that he has been on iron tablets for 3 years  and starting dialysis.  Despite this his hemoglobin seems to be getting worse.  In addition high-resolution CT chest shows emphysema.  He says he has not smoked and he does not smoke but is around a lot of smokers for many years because he lives in a basement.  He is aware this emphysema finding could be passive smoking related.  He is on Anoro but he says it is not helping him.   IMPRESSION: 1. There is a spectrum of findings which could be consistent with early or mild interstitial lung disease, at this time considered indeterminate for usual interstitial pneumonia (UIP) per current ATS guidelines. If there is persistent clinical concern for interstitial lung disease, repeat  high-resolution chest CT would be recommended in 12 months to assess for temporal changes in the appearance of the lung parenchyma. 2. Mild diffuse bronchial wall thickening with mild centrilobular emphysema. 3. Right lower lobe pulmonary nodule measuring 9 x 5 mm (mean diameter of 7 mm), favored to represent a subpleural lymph node, but nonspecific. Attention at time of repeat high-resolution chest CT is recommended to ensure stability. 4. Mild cardiomegaly. 5. Aortic atherosclerosis, in addition to 3 vessel coronary artery disease. Please note that although the presence of coronary artery calcium documents the prsence of coronary artery disease, the severity of this disease and any potential stenosis cannot be assessed on this non-gated CT examination. Assessment for potential risk factor modification, dietary therapy or pharmacologic therapy may be warranted, if cliniclly indicated.  Aortic Atherosclerosis (ICD10-I70.0) and Emphysema (ICD10-J43.9).   Electronically Signed   By: Vinnie Langton M.D.   On: 02/18/2019 16:46   ROS - per HPI    OV 03/21/2019  Subjective:  Patient ID: Louis [name], male , DOB: 1960-05-22 , age 73 y.o. , MRN: [id number] , ADDRESS: [place] [address]  PCP Steele Sizer, MD   03/21/2019 -   Chief Complaint  Patient presents with  . Follow-up    Pt states he has been feeling about the same. Cough due to pneumonia. Occ wheezing, Pt denies any fever or chills    - ESRD x 3 years, chronicy systolic chf,  Anemia (on iron since 3 years) , HIV crackles and symptoms of dyspnea. 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 -follow-up multifactorial dyspnea in the setting of chronic systolic heart failure, anemia, end-stage renal disease with HIV.  Concern for associated emphysema, ILD and lung nodule in January 2021 CT scan.  In this visit we were supposed to check  on his dyspnea start.  In this visit we supposed to check on his dyspnea after starting Spiriva.  Also supposed to review his autoimmune labs.  He tells me that after starting Spiriva he is no better.  He is more worried about his overall fatigue which he thinks is because of blood pressure medications.  He did not do his autoimmune profile till yesterday and some of the results are back and it is still negative.  In the interim he ended up in [facility] ER a few days ago.  It also appears that he had an admission in end of January 2021.  In the ER he had no acute symptoms with the blood pressure was 193/116 with a pulse ox of 99% as reviewed by the chart.  He had a chest x-ray that I do not have the image but it says ill-defined patchy density  in the right midlung zone question developing consolidation.  He did not have any cough or fever at that time.  So the ER discharged him.  He now tells me that he actually has a cough for the last several days.  It is a recurrence of the cough.  He is frustrated by all his symptoms.  He is wondering why I did not do a chest x-ray today.  However there is a telephone visit.  He says that she was on a chest x-ray yesterday when he came for his labs.  He wanted to switch to [facility] pulmonary but they do not have an appointment for him.  Therefore he wants to continue to see me till he gets an appointment there.      Results for MURRAY, DURRELL (MRN [id number]) as of 03/21/2019 12:06  Ref. Range 08/23/2016 06:14 08/23/2016 14:07 08/26/2016 04:06 10/09/2016 04:05 03/20/2019 09:52  Anti Nuclear Antibody (ANA) Latest Ref Range: Negative  Negative      Angiotensin-Converting Enzyme Latest Ref Range: 14 - 82 U/L     14  RA Latex Turbid. Latest Ref Range: 0.0 - 13.9 IU/mL     <10.0  Cytoplasmic (C-ANCA) Latest Ref Range: Neg:<1:20 titer  <1:20     P-ANCA Latest Ref Range: Neg:<1:20 titer  <1:20     Atypical P-ANCA titer Latest Ref Range: Neg:<1:20 titer  <1:20       OV  04/18/2019  Subjective:  Patient ID: Louis [name], male , DOB: 1960-04-20 , age 44 y.o. , MRN: [id number] , ADDRESS: [place] [address]   - ESRD x 3 years, chronicy systolic chf,  Anemia (on iron since 3 years) , HIV crackles and symptoms of dyspnea. Indeterminate ILD with emphysema on CT Jan 2021 along with 9 mm subpleural nodule   04/18/2019 -   Chief Complaint  Patient presents with  . Follow-up    Pt states he thinks he has some congestion. SOB on exertion. Occ wheezing in the am. Pt denies any cough, fever, chills, or sweats     HPI TERRIAN SENTELL 59 y.o. - > presents for follow-up.  Last seen 1 month ago.  I am supposed to see him only in April or May 2021 but he is here earlier.  He does not know why.  At first he said he is stable but then later he told me that he is actually feeling worse.  He feels his dyspnea is worse particularly early in the morning he at that time feels his chest is tight at the lower end.  He is also reporting worsening dyspnea.  Walking desaturation test today shows a decline.  In fact he is having hypoxemia.  This is a change from the December walking desaturation test.  We will plan for a CT scan in April 2021 but he wants this done sooner.  There is no wheezing.  He does have some mucus.  He feels a Spiriva is not helping him.  He plans to start pulmonary rehabilitation soon.      Results for EDWAR, COE (MRN [id number]) as of 03/21/2019 12:06  Ref. Range 12/21/2016 15:05 12/28/2016 10:48 01/02/2017 09:06 04/29/2017 07:52 06/01/2017 09:23 05/01/2018 11:20 05/24/2018 15:34 01/31/2019 12:36 03/05/2019 15:48 03/06/2019 04:13 03/20/2019 09:52  Hemoglobin Latest Ref Range: 13.0 - 17.0 g/dL 12.9 (L) 13.9 13.2 10.1 (L) 9.8 (L) 10.8 (L) 9.4 (L) 8.8 (L) 9.6 (L) 9.7 (L)      ROS - per HPI  OV 06/13/2019  Subjective:  Patient ID: Louis [name], male , DOB: 1961/01/19 , age 43 y.o. , MRN: [id number] , ADDRESS: [place] [address]   06/13/2019 -   Chief Complaint  Patient presents with  . Follow-up    pt using inhaler continous and no longer using O2     HPI IAN CASTAGNA 59 y.o. -presents for follow-up with his mom.  His mom presents for the first time.  She is really worried about him.  She tells me that his anemia is worse.  In fact he ended up in the ER with a hemoglobin of less than 6 g%.  He got blood transfusion.  She is worried about GI loss.  She says primary care is only doing stool occult blood.  She is asking for a GI referral.  I have agreed to do a GI referral locally.  With the main issue is pulmonary.  He had pulmonary function test that shows continued stability.  The DLCO is uncorrected for his low hemoglobin.  He continues to have dyspnea.  He is also reporting a cough.  He says he ended up in the emergency room in an outside facility.  I do not have the chest x-ray.  They told him he had pneumonia given 5 days of doxycycline.  He states he is feeling better.  He recollects being on doxycycline for several months a few years ago for skin issues.  Therefore he is asking if he can be on doxycycline again forSeveral months in order to prevent pneumonias.  I only agreed to give him extended doxycycline capping a total 15 days at his specific request.  He was supposed to have high-resolution CT scan of the chest but he has not had this he missed the appointment.      OV 07/04/2019  Subjective:  Patient ID: Louis [name], male , DOB: 02-24-1960 , age 89 y.o. , MRN: [id number] , ADDRESS: [place] [address]  PCP Steele Sizer, MD   07/04/2019 -   Chief Complaint  Patient presents with  . Follow-up    Patient was in the ED on 5/14 and had GI procedure on 5/18. Patient denies cough and shortness of breath has got a little better.    HIV with concern of ILD  HPI BLANCHARD WILLHITE 59 y.o. -presents for follow-up with his mom.  He had a high-resolution  CT chest to follow-up as potential ILD and multifactorial dyspnea and right-sided pulmonary nodule.  The right-sided pulmonary nodule is stable.  According to the radiologist most of the findings on the high-resolution CT chest are consistent with heart failure.  He has elevated/enlarged pulmonary arteries.  Right heart catheterization is indicated.  I conversed with his cardiologist.  According to the cardiologist patient is not actively following up there.  I asked this of the patient but patient tells me that he did not follow-up there because he he was under the impression he had pulmonary problems.  He says he was quite satisfied with the service.  In fact he has a follow-up appointment on Jul 07, 2019.  He wants to get to the underlying problems causing shortness of breath.  In terms of his anemia he ended up at [facility].  He had upper endoscopy was normal.  But according to the mom he also had a lower endoscopy that shows potential bleeding.  He is scheduled for a capsule endoscopy.  I could not find this  in the [facility] notes other than the fact he had upper endoscopy which I reviewed.  He also ended up in the [facility] ER which the mom states was because of heart failure.  He is improved from that as well.  He wants a work note today.   Labs reviewed and his anemia is better   REc - [facility]HC -> then decide on bronch  - continue spiriva and symbicort   OV 08/12/2019  Subjective:  Patient ID: Louis [name], male , DOB: 05-08-1960 , age 36 y.o. , MRN: [id number] , ADDRESS: [place] [address]   08/12/2019 -  Telephone visit. Patient identified with 2PHI. Risks, benefits, limitatin explained Chief Complaint  Patient presents with  . Follow-up    No complaints      HPI KWALI WRINKLE 59 y.o. -  In the interim saw [facility] cardiology.  His ejection fraction on the left side noted to be low 45%.  He has been referred to Dr. Haroldine Laws in [place] for evaluation of right heart  catheterization.  His first visit with Dr. Haroldine Laws 1 is scheduled for October 07, 2019.  However chart review also shows that on July 18, 2019 he was seen by pulmonary Dr. Domenica Fail AT [facility] saw me  for dyspnea evaluation.  Details are not  Known. He denies knowledge of this.  Then, on  August 01, 2019.  he saw Dr. Sedonia Small for pulmonary hypertension This was at the pulmonary hypertension clinic. Says recommendation ws for heart cath (left and right) . He feels is the same recommendation I made.  Then,   August 07, 2019 he placed a call to Dr. Marijean Bravo inquiring about the status of his right heart catheterization and left heart catheterization.  The documentation states that Dr. Marijean Bravo will reach out to 1 Dr. Denman George and get back to him. AS of today - he has not had the heart cath. Says he will [facility] it [facility] or [facility] - whoever will do it fastest. He is frustrated by the wait.     SYMPTOM SCALE - ILD 04/18/2019  06/13/2019   O2 use ra ra  Shortness of Breath 0 -> 5 scale with 5 being worst (score 6 If unable to do) 3  At rest 0 3  Simple tasks - showers, clothes change, eating, shaving 0 3  Household (dishes, doing bed, laundry) 3 3  Shopping 3 4  Walking level at own pace 3 3  Walking up Stairs 3 4  Total (30-36) Dyspnea Score 12 20  How bad is your cough? 1 3  How bad is your fatigue 5 4  How bad is nausea 0 4  How bad is vomiting?  0   How bad is diarrhea? 0 3  How bad is anxiety? 0 3  How bad is depression 0 2    ROS - per HPI    Simple office walk 185 feet x  3 laps goal with forehead probe 01/31/2019  04/18/2019  06/13/2019   O2 used RA    Number laps completed 3 3 3   Comments about pace Moderate pace    Resting Pulse Ox/HR 98% and 67/min 99% and 73 100% and 83/min  Final Pulse Ox/HR 97% and 69/min 86% and 86/min 95% and 101/min  Desaturated </= 88% no yes no  Desaturated <= 3% points no yes Yes, 5 ponts  Got Tachycardic >/= 90/min no no   Symptoms at end of test Very mild dyspnea  Miscellaneous comments x ? worse      PFT FVC fev1 ratio BD fev1 TLC DLCO uncorr  02/20/2019  3.38L/71% 2.78L/70% x/97% x 4.4L/56% 20.7/65%  06/03/2019 3.45/77%     17.6/57%  07/18/2019 at [facility]  3.57  2.72  76.09    16.19 [18.88 corrected]     Results for KIRTIS, CHALLIS (MRN [id number]) as of 07/04/2019 09:18  Ref. Range 06/02/2019 09:48 06/03/2019 14:33 06/09/2019 15:55 06/25/2019 08:51 07/02/2019 14:00  07/18/2019 at [facility]  Hemoglobin Latest Ref Range: 13.0 - 17.0 g/dL   5.7 (L)  8.9 (L)  10.4 g%    ROS - per HPI  Lungs/Pleura: No pneumothorax. Small dependent bilateral pleural effusions. Numerous small air cysts scattered throughout both lungs, unchanged. Marked diffuse bronchial wall thickening, worsened. Dependent basilar right lower lobe subpleural 9 x 5 mm pulmonary nodule with average diameter 7 mm, stable. No acute consolidative airspace disease, lung masses or new significant pulmonary nodules. No significant air trapping or evidence of tracheobronchomalacia on the expiration sequence. Small parenchymal bands in the basilar posterior lower lobes bilaterally persist on the prone sequence and are unchanged. Prominent patchy ground-glass opacity throughout both lungs is significantly increased. No significant regions of persistent subpleural reticulation on the prone sequence. No significant regions of traction bronchiectasis, architectural distortion or frank honeycombing.  Upper abdomen: No acute abnormality.  Musculoskeletal: No aggressive appearing focal osseous lesions. Moderate thoracic spondylosis.  IMPRESSION: 1. Cardiomegaly. Stable dilated main pulmonary artery, suggesting chronic pulmonary arterial hypertension. New small dependent bilateral pleural effusions. 2. Nonspecific diffuse patchy ground-glass opacity in both lungs, significantly worsened. Cardiogenic pulmonary edema is the leading consideration given the above findings, although  differential includes atypical infection in this patient with a history of HIV. Tiny air cysts throughout both lungs are unchanged and could also support a diagnosis of atypical infection such as pneumocystis pneumonia. 3. No compelling findings of interstitial lung disease. Previously described subpleural opacities do not persist on prone imaging. Stable parenchymal bands in the basilar posterior lower lobes bilaterally, compatible with nonspecific postinfectious/postinflammatory scarring. 4. Marked diffuse bronchial wall thickening, worsened, as can be seen with chronic bronchitis or reactive airways disease. 5. Aortic Atherosclerosis (ICD10-I70.0).   Electronically Signed   By: Ilona Sorrel M.D.   On: 06/25/2019 15:57     ROS - per HPI     has a past medical history of Chronic kidney disease, Dermatophytosis of foot (06/23/2004), Environmental allergies, Gout, HIV infection ([place]) (12/1995), Hypertension, and Seborrhea (06/23/2004).   reports that he has never smoked. He has never used smokeless tobacco.  Past Surgical History:  Procedure Laterality Date  . CAPD INSERTION N/A 12/28/2016   Procedure: LAPAROSCOPIC INSERTION CONTINUOUS AMBULATORY PERITONEAL DIALYSIS  (CAPD) CATHETER;  Surgeon: Algernon Huxley, MD;  Location: [facility] ORS;  Service: Vascular;  Laterality: N/A;  . DIALYSIS/PERMA CATHETER INSERTION N/A 10/09/2016   Procedure: DIALYSIS/PERMA CATHETER INSERTION;  Surgeon: Algernon Huxley, MD;  Location: [place] [facility] LAB;  Service: Cardiovascular;  Laterality: N/A;  . DIALYSIS/PERMA CATHETER REMOVAL N/A 02/19/2017   Procedure: DIALYSIS/PERMA CATHETER REMOVAL;  Surgeon: Algernon Huxley, MD;  Location: [place] CV LAB;  Service: Cardiovascular;  Laterality: N/A;  . HERNIA REPAIR Bilateral    inguinal    Allergies  Allergen Reactions  . Losartan Swelling    Lip swelling  . Quinapril Hcl Swelling    Face swells  . Spironolactone Other (See Comments)    Other  reaction(s): hallucinations    Immunization History  Administered Date(s) Administered  . Hepatitis A 07/11/2012  . Hepatitis A, Adult 03/13/2013  . Hepatitis B 06/22/2004, 07/11/2012  . Hepatitis B, adult 11/07/2012, 03/13/2013, 01/17/2017, 02/18/2018  . Influenza Split 11/03/2011  . Influenza Whole 01/02/1996  . Influenza,inj,Quad PF,6+ Mos 10/05/2017  . Influenza-Unspecified 10/31/2012, 02/15/2016, 11/28/2016, 03/04/2018, 10/29/2018, 11/13/2018  . PFIZER SARS-COV-2 Vaccination 04/29/2019, 05/20/2019  . PPD Test 10/08/2016, 02/11/2018, 02/18/2018, 01/20/2019  . Pneumococcal Conjugate-13 05/10/2018  . Pneumococcal Polysaccharide-23 06/22/2004, 02/28/2012  . Tdap 10/05/2017    Family History  Problem Relation Age of Onset  . Bradycardia Maternal Grandmother        with pacemaker     Current Outpatient Medications:  .  amLODipine (NORVASC) 10 MG tablet, Take 1 tablet (10 mg total) by mouth daily., Disp: 30 tablet, Rfl: 0 .  AURYXIA 1 GM 210 MG(Fe) tablet, Take 1 tablet by mouth 4 (four) times daily -  before meals and at bedtime., Disp: , Rfl:  .  B Complex-C-Folic Acid (RENA-VITE RX) 1 MG TABS, Take 1 tablet by mouth daily., Disp: , Rfl:  .  budesonide-formoterol (SYMBICORT) 80-4.5 MCG/ACT inhaler, Inhale 2 puffs into the lungs 2 (two) times daily., Disp: 1 Inhaler, Rfl: 12 .  cetirizine (ZYRTEC) 10 MG tablet, Take 1 tablet (10 mg total) by mouth daily., Disp: 30 tablet, Rfl: 11 .  cloNIDine (CATAPRES - DOSED IN MG/24 HR) 0.3 mg/24hr patch, Place 1 patch (0.3 mg total) onto the skin once a week., Disp: 4 patch, Rfl: 5 .  DESCOVY 200-25 MG tablet, Take 1 tablet by mouth daily., Disp: , Rfl:  .  diclofenac sodium (VOLTAREN) 1 % GEL, Apply 2 g topically 4 (four) times daily., Disp: 100 g, Rfl: 0 .  Febuxostat 80 MG TABS, Take 1 tablet by mouth daily., Disp: , Rfl:  .  ferrous sulfate 325 (65 FE) MG tablet, Take 1 tablet by mouth daily., Disp: , Rfl:  .  fluticasone (FLONASE) 50  MCG/ACT nasal spray, Place into the nose., Disp: , Rfl:  .  furosemide (LASIX) 40 MG tablet, Take 80 mg by mouth daily. , Disp: , Rfl:  .  halobetasol (ULTRAVATE) 0.05 % cream, APPLY TO AFFECTED AREA TWICE A DAY, Disp: , Rfl:  .  hydrALAZINE (APRESOLINE) 100 MG tablet, Take 150 mg by mouth 3 (three) times daily., Disp: , Rfl:  .  methocarbamol (ROBAXIN) 500 MG tablet, Take 1 tablet (500 mg total) by mouth 2 (two) times daily as needed. for pain, Disp: 60 tablet, Rfl: 0 .  ondansetron (ZOFRAN-ODT) 4 MG disintegrating tablet, Take 4 mg by mouth every 8 (eight) hours as needed., Disp: , Rfl:  .  predniSONE (DELTASONE) 10 MG tablet, Take 1 tablet (10 mg total) by mouth 2 (two) times daily as needed. Prn gout attack for 2 days, Disp: 10 tablet, Rfl: 0 .  SPIRIVA RESPIMAT 1.25 MCG/ACT AERS, INHALE 2 PUFFS BY MOUTH INTO THE LUNGS DAILY, Disp: 4 g, Rfl: 3 .  TIVICAY 50 MG tablet, Take 1 tablet by mouth daily., Disp: , Rfl:  .  valACYclovir (VALTREX) 500 MG tablet, Take 500 mg by mouth daily as needed (for flare ups). , Disp: , Rfl:  .  carvedilol (COREG) 25 MG tablet, Take 1 tablet (25 mg total) by mouth 2 (two) times daily., Disp: 180 tablet, Rfl: 3      Objective:   There were no vitals filed for this visit.  Estimated body mass index is 23.83 kg/m as calculated from the following:  Height as of 08/08/19: 6\' 2"  (1.88 m).   Weight as of 08/08/19: 185 lb 9.6 oz (84.2 kg).  @WEIGHTCHANGE @  There were no vitals filed for this visit.   Physical Exam Sounded normal on phone      Assessment:     No diagnosis found.     Plan:     Patient Instructions  ILD (interstitial lung disease) ([place]) - maybe present/maybe not Pulmonary emphysema, unspecified emphysema type ([place]) HIV Dyspnea  Plan  - d/w Dr Haroldine Laws - his office will call you and set up heart cath next 1-2 weeks  Followup  - Augu 2021 with Dr Chase Caller in [facility] - face to face 48 minutes       SIGNATURE    Dr.  Brand Males, M.D., F.C.C.P,  Pulmonary and Critical Care Medicine Staff Physician, [place] Director - Interstitial Lung Disease  Program  Pulmonary [facility] [place], [place], [address]  Pager: [phone], If no answer or between  15:00h - 7:00h: call [phone] Telephone: [phone]  10:51 AM 08/12/2019

## 2019-08-12 NOTE — Telephone Encounter (Signed)
Infectious disease

## 2019-08-12 NOTE — Patient Instructions (Addendum)
ILD (interstitial lung disease) ([place]) - maybe present/maybe not Pulmonary emphysema, unspecified emphysema type ([facility]) HIV Dyspnea  Plan  - d/w Dr Haroldine Laws - his office will call you and set up heart cath next 1-2 weeks  Followup  - Augu 2021 with Dr Chase Caller in [facility] - face to face 30 minutes

## 2019-08-13 DIAGNOSIS — Z23 Encounter for immunization: Secondary | ICD-10-CM | POA: Diagnosis not present

## 2019-08-13 DIAGNOSIS — N186 End stage renal disease: Secondary | ICD-10-CM | POA: Diagnosis not present

## 2019-08-13 DIAGNOSIS — D631 Anemia in chronic kidney disease: Secondary | ICD-10-CM | POA: Diagnosis not present

## 2019-08-13 DIAGNOSIS — Z992 Dependence on renal dialysis: Secondary | ICD-10-CM | POA: Diagnosis not present

## 2019-08-13 NOTE — Telephone Encounter (Signed)
Not able to enter her name under Care Teams. She does not come up.

## 2019-08-15 ENCOUNTER — Ambulatory Visit: Payer: PRIVATE HEALTH INSURANCE | Admitting: Cardiology

## 2019-08-15 NOTE — Telephone Encounter (Signed)
Tried calling patient, no answer,unable to leave message,vm full. Will try again later.

## 2019-08-19 ENCOUNTER — Encounter: Payer: Self-pay | Admitting: Cardiology

## 2019-08-19 DIAGNOSIS — I5022 Chronic systolic (congestive) heart failure: Secondary | ICD-10-CM | POA: Insufficient documentation

## 2019-08-19 DIAGNOSIS — I5089 Other heart failure: Principal | ICD-10-CM

## 2019-08-20 DIAGNOSIS — K2961 Other gastritis with bleeding: Secondary | ICD-10-CM | POA: Insufficient documentation

## 2019-08-26 ENCOUNTER — Telehealth: Payer: Self-pay

## 2019-08-26 NOTE — Telephone Encounter (Signed)
Copied from [place] [phone]. Topic: General - Inquiry >> Aug 26, 2019  2:52 PM Alease Frame wrote: Reason for CRM: Py is needing a call back from office insurance. Its a medication that's not covered and he needed other options .AURYXIA 1 GM 210 MG(Fe) tablet [[id number]]

## 2019-08-28 NOTE — Telephone Encounter (Signed)
Patient has resolved medication issues.

## 2019-08-29 ENCOUNTER — Telehealth (HOSPITAL_COMMUNITY): Payer: Self-pay | Admitting: *Deleted

## 2019-08-29 NOTE — Telephone Encounter (Signed)
Called pt to schedule r/l heart cath. No answer left vm requesting pt return my call.

## 2019-09-01 NOTE — Telephone Encounter (Signed)
We have been unable to reach pt to sch this, per chart pt is sch for R/L HC at [facility] on 7/27

## 2019-09-04 DIAGNOSIS — E875 Hyperkalemia: Secondary | ICD-10-CM | POA: Insufficient documentation

## 2019-09-09 ENCOUNTER — Encounter: Admit: 2019-09-09 | Discharge: 2019-09-09 | Payer: MEDICARE

## 2019-09-13 ENCOUNTER — Ambulatory Visit: Admit: 2019-09-13 | Discharge: 2019-09-14 | Payer: MEDICARE

## 2019-09-15 DIAGNOSIS — N186 End stage renal disease: Secondary | ICD-10-CM | POA: Diagnosis not present

## 2019-09-15 DIAGNOSIS — Z992 Dependence on renal dialysis: Secondary | ICD-10-CM | POA: Diagnosis not present

## 2019-09-15 DIAGNOSIS — K2961 Other gastritis with bleeding: Secondary | ICD-10-CM | POA: Diagnosis not present

## 2019-09-16 ENCOUNTER — Other Ambulatory Visit
Admission: RE | Admit: 2019-09-16 | Discharge: 2019-09-16 | Disposition: A | Discharge status: Home / Self Care | Payer: Medicare Other | Source: Ambulatory Visit | Attending: Family Medicine | Admitting: Family Medicine

## 2019-09-16 DIAGNOSIS — N186 End stage renal disease: Secondary | ICD-10-CM | POA: Diagnosis not present

## 2019-09-16 DIAGNOSIS — E875 Hyperkalemia: Secondary | ICD-10-CM | POA: Insufficient documentation

## 2019-09-16 LAB — POTASSIUM: Potassium: 4.6 mmol/L (ref 3.5–5.1)

## 2019-09-17 DIAGNOSIS — Z992 Dependence on renal dialysis: Secondary | ICD-10-CM | POA: Diagnosis not present

## 2019-09-17 DIAGNOSIS — K2961 Other gastritis with bleeding: Secondary | ICD-10-CM | POA: Diagnosis not present

## 2019-09-17 DIAGNOSIS — N186 End stage renal disease: Secondary | ICD-10-CM | POA: Diagnosis not present

## 2019-09-17 DIAGNOSIS — Z01818 Encounter for other preprocedural examination: Principal | ICD-10-CM

## 2019-09-19 DIAGNOSIS — N186 End stage renal disease: Secondary | ICD-10-CM | POA: Diagnosis not present

## 2019-09-19 DIAGNOSIS — Z992 Dependence on renal dialysis: Secondary | ICD-10-CM | POA: Diagnosis not present

## 2019-09-19 DIAGNOSIS — K2961 Other gastritis with bleeding: Secondary | ICD-10-CM | POA: Diagnosis not present

## 2019-09-22 DIAGNOSIS — N186 End stage renal disease: Secondary | ICD-10-CM | POA: Diagnosis not present

## 2019-09-22 DIAGNOSIS — Z992 Dependence on renal dialysis: Secondary | ICD-10-CM | POA: Diagnosis not present

## 2019-09-22 DIAGNOSIS — K2961 Other gastritis with bleeding: Secondary | ICD-10-CM | POA: Diagnosis not present

## 2019-09-23 ENCOUNTER — Telehealth: Payer: Self-pay | Admitting: Internal Medicine

## 2019-09-23 DIAGNOSIS — J449 Chronic obstructive pulmonary disease, unspecified: Secondary | ICD-10-CM | POA: Diagnosis not present

## 2019-09-23 NOTE — Telephone Encounter (Signed)
Called and spoke with pt. Stated to him that I was not finding any message where anyone from our office had tried to contact him and he verbalized understanding. Nothing further needed.

## 2019-09-23 NOTE — Telephone Encounter (Signed)
Patient returning call. Patient unsure of who/why we called. In chart, there's no indication we called. Please advise [phone] Patient had both heart valves checked per MR and wanted Dr Marijean Bravo to forward to [place]. Patient still having pain in lungs when he wakes up.

## 2019-09-24 ENCOUNTER — Telehealth: Payer: Self-pay | Admitting: Family Medicine

## 2019-09-24 DIAGNOSIS — K2961 Other gastritis with bleeding: Secondary | ICD-10-CM | POA: Diagnosis not present

## 2019-09-24 DIAGNOSIS — N186 End stage renal disease: Secondary | ICD-10-CM | POA: Diagnosis not present

## 2019-09-24 DIAGNOSIS — Z992 Dependence on renal dialysis: Secondary | ICD-10-CM | POA: Diagnosis not present

## 2019-09-24 NOTE — Telephone Encounter (Signed)
Copied from [place] [phone]. Topic: Medicare AWV >> Sep 24, 2019 10:01 AM Cher Nakai R wrote: Reason for CRM:  Left message for patient to call back and schedule Medicare Annual Wellness Visit (AWV) to be done virtually.  No hx of AWV eligible 12/14/2017  Please schedule at anytime with [place].      40 Minutes appointment   Any questions, please call me at [phone]

## 2019-09-27 ENCOUNTER — Other Ambulatory Visit: Payer: Self-pay | Admitting: Family Medicine

## 2019-09-27 NOTE — Telephone Encounter (Signed)
Requested medication (s) are due for refill today: ys  Requested medication (s) are on the active medication list: yes  Last refill:  07/28/19  Future visit scheduled: no  Notes to clinic:  med not delegated to NT to RF   Requested Prescriptions  Pending Prescriptions Disp Refills   methocarbamol (ROBAXIN) 500 MG tablet [Pharmacy Med Name: METHOCARBAMOL 500 MG TABLET] 60 tablet 0    Sig: Take 1 tablet (500 mg total) by mouth 2 (two) times daily as needed. for pain      Not Delegated - Analgesics:  Muscle Relaxants Failed - 09/27/2019  3:28 PM      Failed - This refill cannot be delegated      Passed - Valid encounter within last 6 months    Recent Outpatient Visits           1 month ago ESRD on dialysis [facility])   [facility] Steele Sizer, MD   5 months ago ESRD on dialysis [facility])   [facility] Steele Sizer, MD   8 months ago Chronic systolic heart failure [facility])   [facility] Steele Sizer, MD   1 year ago Exposure to [facility] Steele Sizer, MD   1 year ago Uncontrolled hypertension   [facility] Steele Sizer, MD

## 2019-09-29 ENCOUNTER — Telehealth: Payer: Self-pay | Admitting: Internal Medicine

## 2019-09-29 DIAGNOSIS — J849 Interstitial pulmonary disease, unspecified: Secondary | ICD-10-CM

## 2019-09-29 DIAGNOSIS — Z992 Dependence on renal dialysis: Secondary | ICD-10-CM | POA: Diagnosis not present

## 2019-09-29 DIAGNOSIS — N186 End stage renal disease: Secondary | ICD-10-CM | POA: Diagnosis not present

## 2019-09-29 DIAGNOSIS — K2961 Other gastritis with bleeding: Secondary | ICD-10-CM | POA: Diagnosis not present

## 2019-09-29 MED ORDER — SPIRIVA RESPIMAT 1.25 MCG/ACT IN AERS: INHALATION_SPRAY | RESPIRATORY_TRACT | 3 refills | Status: DC

## 2019-09-29 NOTE — Telephone Encounter (Signed)
Called and spoke with Patient.  Patient requested a refill of Spiriva sent to [place].  Requested prescription sent to pharmacy.  Patient also requested to have his O2 d/c from [place], and receive results from heart cath.  Message routed to Dr Chase Caller to advise on results

## 2019-09-30 ENCOUNTER — Other Ambulatory Visit: Payer: Self-pay | Admitting: Family Medicine

## 2019-09-30 DIAGNOSIS — N186 End stage renal disease: Secondary | ICD-10-CM

## 2019-09-30 DIAGNOSIS — E875 Hyperkalemia: Secondary | ICD-10-CM

## 2019-09-30 NOTE — Telephone Encounter (Signed)
Will keep message in my box until October schedule is open to schedule pt and will call him at that time to schedule and inform him of the O2 d/c.

## 2019-09-30 NOTE — Telephone Encounter (Signed)
Called and spoke to pt. Informed him of the results and recs per MR. Pt verbalized understanding.   Pt still questioning if he can have an order to d/c O2, pt states he never wore it as it made him feel worse. Also, there are no current openings in [facility] with MR.    Dr. Chase Caller, please advise regarding appt with you in [facility] and order to d/c O2. Thanks.

## 2019-09-30 NOTE — Telephone Encounter (Signed)
Ok to [employer]  I sent request for oct 2021 few days ago - I can see him then. Please check with Sharl Ma when they are going to open Oct

## 2019-09-30 NOTE — Telephone Encounter (Signed)
Pressures  Right atrium mean 6 mm Hg  Right ventricle 37/7 mm Hg, End-diastolic 10 mm Hg  Pulmonary artery 40/10 mm Hg, Mean 22 mm Hg  Pulmonary capillary wedge Mean 10 mm Hg   Arterial saturation: 99% Mixed venous saturation: 75%  Fick Cardiac output: 7.14 L/min Cardiac index: 3.4 L/min/m2  Systemic vascular resistance (SVR): 1042 dynes*s/cm5 Pulmonary vascular resistance (PVR): 2 Wood units  The heart cath is normal. Good news  Plan  - not sure he needs his appt with DR Bensimohn anymore on 10/07/19 because he had his cath at [facility]  - make appt to see me in [facility] - first available is fine    xxxxxxxxxxxxxxxxxxxxxxxxxxxxxxxxxxxxxxxxxxxxxxxxxxx  Hemodynamics and Left Heart Catheterization  Aortic pressure: 135/75 mm Hg (mean 99 mm Hg)  LVEDP = 11 mm Hg  Left Ventriculogram  RAO Left Ventriculogram:Abnormal  LAO Left Ventriculogram: Not Performed  Ejection Fraction (visual estimate): 40-45%  Mitral Regurgitation: None  Wall motion: Normal  Coronary Angiography  Dominance: Right  Left main: The left main coronary artery (LMCA) is a large-caliber vessel  that originates from the left coronary sinus. It bifurcates into the left  anterior descending (LAD) and left circumflex (LCx) arteries. There is no  angiographic evidence of significant disease in the LMCA.  LAD: The LAD is a large-caliber vessel that gives off 3 diagonal (D)  branches before it wraps around the apex. D1 is a small-caliber vessel. D2  is a small-caliber vessel. D3 is a very small-caliber vessel. There is no  angiographic evidence of significant disease in the LAD.  Left circumflex: The LCx is a large-caliber vessel that gives off 2  bifurcating obtuse marginal (OM) branches and then continues as a small  vessel in the AV groove. OM1 is a large-caliber vessel. OM2 is a  large-caliber vessel. There is no angiographic evidence of significant  disease in the LCx.  Right coronary: The right  coronary artery (RCA) is a large-caliber vessel  originating from the right coronary sinus. It bifurcates distally into a  posterior descending artery (PDA) and posterolateral (PL) branches  consistent with a right dominant system. There is no angiographic evidence  of significant disease in the RCA.  Pre-procedure diagnosis: concern for CAD and/or pulmonary hypertension  Post-procedure diagnosis: no significant CAD and mild PH

## 2019-10-01 ENCOUNTER — Other Ambulatory Visit
Admission: RE | Admit: 2019-10-01 | Discharge: 2019-10-01 | Disposition: A | Discharge status: Home / Self Care | Payer: PRIVATE HEALTH INSURANCE | Source: Ambulatory Visit | Attending: Nephrology | Admitting: Nephrology

## 2019-10-01 DIAGNOSIS — N186 End stage renal disease: Secondary | ICD-10-CM | POA: Insufficient documentation

## 2019-10-01 DIAGNOSIS — Z992 Dependence on renal dialysis: Secondary | ICD-10-CM | POA: Diagnosis not present

## 2019-10-01 DIAGNOSIS — K2961 Other gastritis with bleeding: Secondary | ICD-10-CM | POA: Diagnosis not present

## 2019-10-01 LAB — PHOSPHORUS: Phosphorus: 4.8 mg/dL — ABNORMAL HIGH (ref 2.5–4.6)

## 2019-10-01 LAB — POTASSIUM: Potassium: 4.7 mmol/L (ref 3.5–5.1)

## 2019-10-02 ENCOUNTER — Ambulatory Visit (INDEPENDENT_AMBULATORY_CARE_PROVIDER_SITE_OTHER): Payer: Medicare Other

## 2019-10-02 ENCOUNTER — Other Ambulatory Visit: Payer: Self-pay

## 2019-10-02 DIAGNOSIS — Z992 Dependence on renal dialysis: Secondary | ICD-10-CM | POA: Diagnosis not present

## 2019-10-02 DIAGNOSIS — Z0000 Encounter for general adult medical examination without abnormal findings: Secondary | ICD-10-CM | POA: Diagnosis not present

## 2019-10-02 DIAGNOSIS — K2961 Other gastritis with bleeding: Secondary | ICD-10-CM | POA: Diagnosis not present

## 2019-10-02 DIAGNOSIS — N186 End stage renal disease: Secondary | ICD-10-CM | POA: Diagnosis not present

## 2019-10-02 NOTE — Telephone Encounter (Signed)
Pt inquired about refill request for methocarbamol during virtual AWV today. Please advise. Thank you!

## 2019-10-02 NOTE — Patient Instructions (Signed)
Louis [name] , Thank you for taking time to come for your Medicare Wellness Visit. I appreciate your ongoing commitment to your health goals. Please review the following plan we discussed and let me know if I can assist you in the future.   Screening recommendations/referrals: Colonoscopy: done 07/01/19. Repeat in 2026 Recommended yearly ophthalmology/optometry visit for glaucoma screening and checkup Recommended yearly dental visit for hygiene and checkup  Vaccinations: Influenza vaccine: done 11/13/18 Pneumococcal vaccine: done 05/10/18 Tdap vaccine: done 10/05/17 Shingles vaccine: Shingrix discussed. Please contact your pharmacy for coverage information.  Covid-19: done 04/29/19 & 05/20/19  Advanced directives: Advance directive discussed with you today. Even though you declined this today please call our office should you change your mind and we can give you the proper paperwork for you to fill out.  Conditions/risks identified: Recommend increasing physical activity as tolerated  Next appointment: Follow up in one year for your annual wellness visit.   Preventive Care 63 Years and Older, Male Preventive care refers to lifestyle choices and visits with your health care provider that can promote health and wellness. What does preventive care include?  A yearly physical exam. This is also called an annual well check.  Dental exams once or twice a year.  Routine eye exams. Ask your health care provider how often you should have your eyes checked.  Personal lifestyle choices, including:  Daily care of your teeth and gums.  Regular physical activity.  Eating a healthy diet.  Avoiding tobacco and drug use.  Limiting alcohol use.  Practicing safe sex.  Taking low doses of aspirin every day.  Taking vitamin and mineral supplements as recommended by your health care provider. What happens during an annual well check? The services and screenings done by your health care provider  during your annual well check will depend on your age, overall health, lifestyle risk factors, and family history of disease. Counseling  Your health care provider may ask you questions about your:  Alcohol use.  Tobacco use.  Drug use.  Emotional well-being.  Home and relationship well-being.  Sexual activity.  Eating habits.  History of falls.  Memory and ability to understand (cognition).  Work and work Statistician. Screening  You may have the following tests or measurements:  Height, weight, and BMI.  Blood pressure.  Lipid and cholesterol levels. These may be checked every 5 years, or more frequently if you are over 22 years old.  Skin check.  Lung cancer screening. You may have this screening every year starting at age 12 if you have a 30-pack-year history of smoking and currently smoke or have quit within the past 15 years.  Fecal occult blood test (FOBT) of the stool. You may have this test every year starting at age 78.  Flexible sigmoidoscopy or colonoscopy. You may have a sigmoidoscopy every 5 years or a colonoscopy every 10 years starting at age 68.  Prostate cancer screening. Recommendations will vary depending on your family history and other risks.  Hepatitis C blood test.  Hepatitis B blood test.  Sexually transmitted disease (STD) testing.  Diabetes screening. This is done by checking your blood sugar (glucose) after you have not eaten for a while (fasting). You may have this done every 1-3 years.  Abdominal aortic aneurysm (AAA) screening. You may need this if you are a current or former smoker.  Osteoporosis. You may be screened starting at age 47 if you are at high risk. Talk with your health care provider about your test  results, treatment options, and if necessary, the need for more tests. Vaccines  Your health care provider may recommend certain vaccines, such as:  Influenza vaccine. This is recommended every year.  Tetanus,  diphtheria, and acellular pertussis (Tdap, Td) vaccine. You may need a Td booster every 10 years.  Zoster vaccine. You may need this after age 101.  Pneumococcal 13-valent conjugate (PCV13) vaccine. One dose is recommended after age 45.  Pneumococcal polysaccharide (PPSV23) vaccine. One dose is recommended after age 1. Talk to your health care provider about which screenings and vaccines you need and how often you need them. This information is not intended to replace advice given to you by your health care provider. Make sure you discuss any questions you have with your health care provider. Document Released: 02/26/2015 Document Revised: 10/20/2015 Document Reviewed: 12/01/2014 Elsevier Interactive Patient Education  2017 [place] Prevention in the Home Falls can cause injuries. They can happen to people of all ages. There are many things you can do to make your home safe and to help prevent falls. What can I do on the outside of my home?  Regularly fix the edges of walkways and driveways and fix any cracks.  Remove anything that might make you trip as you walk through a door, such as a raised step or threshold.  Trim any bushes or trees on the path to your home.  Use bright outdoor lighting.  Clear any walking paths of anything that might make someone trip, such as rocks or tools.  Regularly check to see if handrails are loose or broken. Make sure that both sides of any steps have handrails.  Any raised decks and porches should have guardrails on the edges.  Have any leaves, snow, or ice cleared regularly.  Use sand or salt on walking paths during winter.  Clean up any spills in your garage right away. This includes oil or grease spills. What can I do in the bathroom?  Use night lights.  Install grab bars by the toilet and in the tub and shower. Do not use towel bars as grab bars.  Use non-skid mats or decals in the tub or shower.  If you need to sit down in  the shower, use a plastic, non-slip stool.  Keep the floor dry. Clean up any water that spills on the floor as soon as it happens.  Remove soap buildup in the tub or shower regularly.  Attach bath mats securely with double-sided non-slip rug tape.  Do not have throw rugs and other things on the floor that can make you trip. What can I do in the bedroom?  Use night lights.  Make sure that you have a light by your bed that is easy to reach.  Do not use any sheets or blankets that are too big for your bed. They should not hang down onto the floor.  Have a firm chair that has side arms. You can use this for support while you get dressed.  Do not have throw rugs and other things on the floor that can make you trip. What can I do in the kitchen?  Clean up any spills right away.  Avoid walking on wet floors.  Keep items that you use a lot in easy-to-reach places.  If you need to reach something above you, use a strong step stool that has a grab bar.  Keep electrical cords out of the way.  Do not use floor polish or wax that makes  floors slippery. If you must use wax, use non-skid floor wax.  Do not have throw rugs and other things on the floor that can make you trip. What can I do with my stairs?  Do not leave any items on the stairs.  Make sure that there are handrails on both sides of the stairs and use them. Fix handrails that are broken or loose. Make sure that handrails are as long as the stairways.  Check any carpeting to make sure that it is firmly attached to the stairs. Fix any carpet that is loose or worn.  Avoid having throw rugs at the top or bottom of the stairs. If you do have throw rugs, attach them to the floor with carpet tape.  Make sure that you have a light switch at the top of the stairs and the bottom of the stairs. If you do not have them, ask someone to add them for you. What else can I do to help prevent falls?  Wear shoes that:  Do not have high  heels.  Have rubber bottoms.  Are comfortable and fit you well.  Are closed at the toe. Do not wear sandals.  If you use a stepladder:  Make sure that it is fully opened. Do not climb a closed stepladder.  Make sure that both sides of the stepladder are locked into place.  Ask someone to hold it for you, if possible.  Clearly mark and make sure that you can see:  Any grab bars or handrails.  First and last steps.  Where the edge of each step is.  Use tools that help you move around (mobility aids) if they are needed. These include:  Canes.  Walkers.  Scooters.  Crutches.  Turn on the lights when you go into a dark area. Replace any light bulbs as soon as they burn out.  Set up your furniture so you have a clear path. Avoid moving your furniture around.  If any of your floors are uneven, fix them.  If there are any pets around you, be aware of where they are.  Review your medicines with your doctor. Some medicines can make you feel dizzy. This can increase your chance of falling. Ask your doctor what other things that you can do to help prevent falls. This information is not intended to replace advice given to you by your health care provider. Make sure you discuss any questions you have with your health care provider. Document Released: 11/26/2008 Document Revised: 07/08/2015 Document Reviewed: 03/06/2014 Elsevier Interactive Patient Education  2017 [employer].

## 2019-10-02 NOTE — Progress Notes (Signed)
Subjective:   Louis [name] is a 59 y.o. male who presents for an Initial Medicare Annual Wellness Visit.  Virtual Visit via Telephone Note  I connected with  Louis [name] on 10/02/19 at  1:30 PM EDT by telephone and verified that I am speaking with the correct person using two identifiers.  Medicare Annual Wellness visit completed telephonically due to Covid-19 pandemic.   Location: Patient: home Provider: [place]   I discussed the limitations, risks, security and privacy concerns of performing an evaluation and management service by telephone and the availability of in person appointments. The patient expressed understanding and agreed to proceed.  Unable to perform video visit due to video visit attempted and failed and/or patient does not have video capability.   Some vital signs may be absent or patient reported.   Clemetine Marker, LPN    Review of Systems           Objective:    There were no vitals filed for this visit. There is no height or weight on file to calculate BMI.  Advanced Directives 04/22/2019 03/05/2019 07/05/2017 06/01/2017 04/29/2017 04/28/2017 04/16/2017  Does Patient Have a Medical Advance Directive? Yes No No No No No No  Type of Advance Directive Living will - - - - - -  Would patient like information on creating a medical advance directive? - No - Patient declined No - Patient declined - No - Patient declined No - Patient declined No - Patient declined    Current Medications (verified) Outpatient Encounter Medications as of 10/02/2019  Medication Sig  . amLODipine (NORVASC) 10 MG tablet Take 1 tablet (10 mg total) by mouth daily.  Lorin Picket 1 GM 210 MG(Fe) tablet Take 1 tablet by mouth 4 (four) times daily -  before meals and at bedtime.  . B Complex-C-Folic Acid (RENA-VITE RX) 1 MG TABS Take 1 tablet by mouth daily.  . budesonide-formoterol (SYMBICORT) 80-4.5 MCG/ACT inhaler Inhale 2 puffs into the lungs 2 (two) times daily.  . carvedilol  (COREG) 25 MG tablet Take 1 tablet (25 mg total) by mouth 2 (two) times daily.  . cetirizine (ZYRTEC) 10 MG tablet Take 1 tablet (10 mg total) by mouth daily.  . cloNIDine (CATAPRES - DOSED IN MG/24 HR) 0.3 mg/24hr patch Place 1 patch (0.3 mg total) onto the skin once a week.  . DESCOVY 200-25 MG tablet Take 1 tablet by mouth daily.  . diclofenac sodium (VOLTAREN) 1 % GEL Apply 2 g topically 4 (four) times daily.  . Febuxostat 80 MG TABS Take 1 tablet by mouth daily.  . ferrous sulfate 325 (65 FE) MG tablet Take 1 tablet by mouth daily.  . fluticasone (FLONASE) 50 MCG/ACT nasal spray Place into the nose.  . furosemide (LASIX) 40 MG tablet Take 80 mg by mouth daily.   . halobetasol (ULTRAVATE) 0.05 % cream APPLY TO AFFECTED AREA TWICE A DAY  . hydrALAZINE (APRESOLINE) 100 MG tablet Take 150 mg by mouth 3 (three) times daily.  . methocarbamol (ROBAXIN) 500 MG tablet Take 1 tablet (500 mg total) by mouth 2 (two) times daily as needed. for pain  . ondansetron (ZOFRAN-ODT) 4 MG disintegrating tablet Take 4 mg by mouth every 8 (eight) hours as needed.  . predniSONE (DELTASONE) 10 MG tablet Take 1 tablet (10 mg total) by mouth 2 (two) times daily as needed. Prn gout attack for 2 days  . sevelamer carbonate (RENVELA) 800 MG tablet Take 800 mg by mouth 3 (three) times  daily.  . Tiotropium Bromide Monohydrate (SPIRIVA RESPIMAT) 1.25 MCG/ACT AERS INHALE 2 PUFFS BY MOUTH INTO THE LUNGS DAILY  . TIVICAY 50 MG tablet Take 1 tablet by mouth daily.  . valACYclovir (VALTREX) 500 MG tablet Take 500 mg by mouth daily as needed (for flare ups).    No facility-administered encounter medications on file as of 10/02/2019.    Allergies (verified) Losartan, Quinapril hcl, and Spironolactone   History: Past Medical History:  Diagnosis Date  . Chronic kidney disease    esrd  . Dermatophytosis of foot 06/23/2004  . Environmental allergies   . Gout   . HIV infection ([place]) 12/1995  . Hypertension   . Seborrhea  06/23/2004   Past Surgical History:  Procedure Laterality Date  . CAPD INSERTION N/A 12/28/2016   Procedure: LAPAROSCOPIC INSERTION CONTINUOUS AMBULATORY PERITONEAL DIALYSIS  (CAPD) CATHETER;  Surgeon: Algernon Huxley, MD;  Location: [facility];  Service: Vascular;  Laterality: N/A;  . DIALYSIS/PERMA CATHETER INSERTION N/A 10/09/2016   Procedure: DIALYSIS/PERMA CATHETER INSERTION;  Surgeon: Algernon Huxley, MD;  Location: [facility];  Service: Cardiovascular;  Laterality: N/A;  . DIALYSIS/PERMA CATHETER REMOVAL N/A 02/19/2017   Procedure: DIALYSIS/PERMA CATHETER REMOVAL;  Surgeon: Algernon Huxley, MD;  Location: [facility];  Service: Cardiovascular;  Laterality: N/A;  . HERNIA REPAIR Bilateral    inguinal   Family History  Problem Relation Age of Onset  . Bradycardia Maternal Grandmother        with pacemaker   Social History   Socioeconomic History  . Marital status: Single    Spouse name: Not on file  . Number of children: 0  . Years of education: Not on file  . Highest education level: Associate degree: occupational, Hotel manager, or vocational program  Occupational History  . Not on file  Tobacco Use  . Smoking status: Never Smoker  . Smokeless tobacco: Never Used  Vaping Use  . Vaping Use: Never used  Substance and Sexual Activity  . Alcohol use: Not Currently    Alcohol/week: 2.0 standard drinks    Types: 2 Standard drinks or equivalent per week    Comment: has not had any alcohol in a while  . Drug use: No  . Sexual activity: Yes    Partners: Female    Birth control/protection: Condom  Other Topics Concern  . Not on file  Social History Narrative  . Not on file   Social Determinants of Health   Financial Resource Strain:   . Difficulty of Paying Living Expenses: Not on file  Food Insecurity:   . Worried About Charity fundraiser in the Last Year: Not on file  . Ran Out of Food in the Last Year: Not on file  Transportation Needs:   . Lack of  Transportation (Medical): Not on file  . Lack of Transportation (Non-Medical): Not on file  Physical Activity:   . Days of Exercise per Week: Not on file  . Minutes of Exercise per Session: Not on file  Stress:   . Feeling of Stress : Not on file  Social Connections:   . Frequency of Communication with Friends and Family: Not on file  . Frequency of Social Gatherings with Friends and Family: Not on file  . Attends Religious Services: Not on file  . Active Member of Clubs or Organizations: Not on file  . Attends Archivist Meetings: Not on file  . Marital Status: Not on file    Tobacco Counseling  Counseling given: Not Answered   Clinical Intake:                          Activities of Daily Living In your present state of health, do you have any difficulty performing the following activities: 03/05/2019 01/21/2019  Hearing? N N  Vision? N N  Difficulty concentrating or making decisions? N N  Walking or climbing stairs? N N  Dressing or bathing? N N  Doing errands, shopping? N N  Some recent data might be hidden    Patient Care Team: Steele Sizer, MD as PCP - General (Family Medicine) Alice Reichert, [place] Charon, MD as Referring Physician (Surgery) Baxter Kail, MD (Dermatology) Kate Sable, MD as Consulting Physician (Cardiology) Fanny Bien, MD as Consulting Physician (Nephrology) Brand Males, MD as Consulting Physician (Pulmonary Disease)  Indicate any recent Medical Services you may have received from other than [facility] providers in the past year (date may be approximate).     Assessment:   This is a routine wellness examination for [place].  Hearing/Vision screen No exam data present  Dietary issues and exercise activities discussed:    Goals   None    Depression Screen PHQ 2/9 Scores 08/08/2019 04/28/2019 04/21/2019 01/21/2019 09/05/2018 08/19/2018 05/31/2018  PHQ - 2 Score 0 0 0 0 0 0 0  PHQ- 9 Score 0 2 3 0 0 5 9     Fall Risk Fall Risk  08/08/2019 04/22/2019 04/21/2019 09/05/2018 08/19/2018  Falls in the past year? 0 0 0 0 0  Number falls in past yr: 0 - 0 0 0  Injury with Fall? 0 - 0 0 0  Comment - - - - -  Follow up - Falls evaluation completed Falls evaluation completed - -    Any stairs in or around the home? Yes  If so, are there any without handrails? No  Home free of loose throw rugs in walkways, pet beds, electrical cords, etc? Yes  Adequate lighting in your home to reduce risk of falls? Yes   ASSISTIVE DEVICES UTILIZED TO PREVENT FALLS:  Life alert? No  Use of a cane, walker or w/c? No  Grab bars in the bathroom? Yes  Shower chair or bench in shower? No  Elevated toilet seat or a handicapped toilet? No   TIMED UP AND GO:  Was the test performed? No . Telephonic visit.   Cognitive Function:        Immunizations Immunization History  Administered Date(s) Administered  . Hepatitis A 07/11/2012  . Hepatitis A, Adult 03/13/2013  . Hepatitis B 06/22/2004, 07/11/2012  . Hepatitis B, adult 11/07/2012, 03/13/2013, 01/17/2017, 02/18/2018, 06/04/2019, 08/08/2019, 09/03/2019  . Influenza Split 11/03/2011  . Influenza Whole 01/02/1996  . Influenza,inj,Quad PF,6+ Mos 10/05/2017  . Influenza-Unspecified 10/31/2012, 02/15/2016, 11/28/2016, 03/04/2018, 10/29/2018, 11/13/2018  . PFIZER SARS-COV-2 Vaccination 04/29/2019, 05/20/2019  . PPD Test 10/08/2016, 02/11/2018, 02/18/2018, 01/20/2019  . Pneumococcal Conjugate-13 05/10/2018  . Pneumococcal Polysaccharide-23 06/22/2004, 02/28/2012, 08/08/2019  . Tdap 10/05/2017    TDAP status: Up to date   Flu Vaccine status: Up to date   Pneumococcal vaccine status: Up to date   Covid-19 vaccine status: Completed vaccines  Qualifies for Shingles Vaccine? Yes   Zostavax completed No   Shingrix Completed?: No.    Education has been provided regarding the importance of this vaccine. Patient has been advised to call insurance company to determine out  of pocket expense if they have not yet received this vaccine.  Advised may also receive vaccine at local pharmacy or Health Dept. Verbalized acceptance and understanding.  Screening Tests Health Maintenance  Topic Date Due  . COLONOSCOPY  Never done  . INFLUENZA VACCINE  09/14/2019  . TETANUS/TDAP  10/06/2027  . COVID-19 Vaccine  Completed  . Hepatitis C Screening  Completed  . HIV Screening  Completed    Health Maintenance  Health Maintenance Due  Topic Date Due  . COLONOSCOPY  Never done  . INFLUENZA VACCINE  09/14/2019    Colorectal cancer screening: Completed 07/01/19. Repeat every 5 years  Lung Cancer Screening: (Low Dose CT Chest recommended if Age 2-80 years, 30 pack-year currently smoking OR have quit w/in 15years.) does not qualify.    Additional Screening:  Hepatitis C Screening: does qualify; Completed 08/23/16  Vision Screening: Recommended annual ophthalmology exams for early detection of glaucoma and other disorders of the eye. Is the patient up to date with their annual eye exam?  No Who is the provider or what is the name of the office in which the patient attends annual eye exams? [facility]  Dental Screening: Recommended annual dental exams for proper oral hygiene  Community Resource Referral / Chronic Care Management: CRR required this visit?  No   CCM required this visit?  No      Plan:     I have personally reviewed and noted the following in the patient's chart:   . Medical and social history . Use of alcohol, tobacco or illicit drugs  . Current medications and supplements . Functional ability and status . Nutritional status . Physical activity . Advanced directives . List of other physicians . Hospitalizations, surgeries, and ER visits in previous 12 months . Vitals . Screenings to include cognitive, depression, and falls . Referrals and appointments  In addition, I have reviewed and discussed with patient certain preventive  protocols, quality metrics, and best practice recommendations. A written personalized care plan for preventive services as well as general preventive health recommendations were provided to patient.     Clemetine Marker, LPN   [id number]   Nurse Notes: pt states he requested refill of methocarbamol recently and has not heard anything back. Telephone encounter created for Dr. Ancil Boozer.

## 2019-10-02 NOTE — Telephone Encounter (Signed)
lmtcb for pt. MRs schedule is open for October in [place], pt needs OV.  D/c order for O2 has been placed.

## 2019-10-03 DIAGNOSIS — K2961 Other gastritis with bleeding: Secondary | ICD-10-CM | POA: Diagnosis not present

## 2019-10-03 DIAGNOSIS — Z992 Dependence on renal dialysis: Secondary | ICD-10-CM | POA: Diagnosis not present

## 2019-10-03 DIAGNOSIS — N186 End stage renal disease: Secondary | ICD-10-CM | POA: Diagnosis not present

## 2019-10-06 ENCOUNTER — Ambulatory Visit: Admit: 2019-10-06 | Discharge: 2019-10-06 | Payer: MEDICARE

## 2019-10-06 ENCOUNTER — Encounter: Admit: 2019-10-06 | Payer: MEDICARE | Attending: Infectious Disease | Primary: Infectious Disease

## 2019-10-06 ENCOUNTER — Encounter
Admit: 2019-10-06 | Discharge: 2019-10-06 | Payer: MEDICARE | Attending: Student in an Organized Health Care Education/Training Program | Primary: Student in an Organized Health Care Education/Training Program

## 2019-10-06 DIAGNOSIS — N186 End stage renal disease: Secondary | ICD-10-CM | POA: Diagnosis not present

## 2019-10-06 DIAGNOSIS — Z992 Dependence on renal dialysis: Secondary | ICD-10-CM | POA: Diagnosis not present

## 2019-10-06 DIAGNOSIS — I120 Hypertensive chronic kidney disease with stage 5 chronic kidney disease or end stage renal disease: Secondary | ICD-10-CM | POA: Diagnosis not present

## 2019-10-06 DIAGNOSIS — K2961 Other gastritis with bleeding: Secondary | ICD-10-CM | POA: Diagnosis not present

## 2019-10-07 ENCOUNTER — Ambulatory Visit: Admit: 2019-10-07 | Discharge: 2019-10-08 | Payer: MEDICARE

## 2019-10-07 ENCOUNTER — Encounter (HOSPITAL_COMMUNITY): Payer: PRIVATE HEALTH INSURANCE | Admitting: Internal Medicine

## 2019-10-07 DIAGNOSIS — I871 Compression of vein: Secondary | ICD-10-CM | POA: Diagnosis not present

## 2019-10-08 DIAGNOSIS — N186 End stage renal disease: Secondary | ICD-10-CM | POA: Diagnosis not present

## 2019-10-08 DIAGNOSIS — Z992 Dependence on renal dialysis: Secondary | ICD-10-CM | POA: Diagnosis not present

## 2019-10-08 DIAGNOSIS — K2961 Other gastritis with bleeding: Secondary | ICD-10-CM | POA: Diagnosis not present

## 2019-10-09 DIAGNOSIS — I5022 Chronic systolic (congestive) heart failure: Principal | ICD-10-CM

## 2019-10-09 DIAGNOSIS — N186 End stage renal disease: Principal | ICD-10-CM

## 2019-10-09 DIAGNOSIS — R0600 Dyspnea, unspecified: Principal | ICD-10-CM

## 2019-10-10 DIAGNOSIS — Z992 Dependence on renal dialysis: Secondary | ICD-10-CM | POA: Diagnosis not present

## 2019-10-10 DIAGNOSIS — N186 End stage renal disease: Secondary | ICD-10-CM | POA: Diagnosis not present

## 2019-10-10 DIAGNOSIS — K2961 Other gastritis with bleeding: Secondary | ICD-10-CM | POA: Diagnosis not present

## 2019-10-13 DIAGNOSIS — N186 End stage renal disease: Secondary | ICD-10-CM | POA: Diagnosis not present

## 2019-10-13 DIAGNOSIS — K2961 Other gastritis with bleeding: Secondary | ICD-10-CM | POA: Diagnosis not present

## 2019-10-13 DIAGNOSIS — Z992 Dependence on renal dialysis: Secondary | ICD-10-CM | POA: Diagnosis not present

## 2019-10-14 ENCOUNTER — Encounter: Admit: 2019-10-14 | Discharge: 2019-10-15 | Payer: MEDICARE

## 2019-10-14 DIAGNOSIS — N186 End stage renal disease: Secondary | ICD-10-CM | POA: Diagnosis not present

## 2019-10-14 DIAGNOSIS — K2961 Other gastritis with bleeding: Secondary | ICD-10-CM | POA: Diagnosis not present

## 2019-10-14 DIAGNOSIS — Z992 Dependence on renal dialysis: Secondary | ICD-10-CM | POA: Diagnosis not present

## 2019-10-15 DIAGNOSIS — N186 End stage renal disease: Secondary | ICD-10-CM | POA: Diagnosis not present

## 2019-10-15 DIAGNOSIS — Z992 Dependence on renal dialysis: Secondary | ICD-10-CM | POA: Diagnosis not present

## 2019-10-15 DIAGNOSIS — K2961 Other gastritis with bleeding: Secondary | ICD-10-CM | POA: Diagnosis not present

## 2019-10-15 DIAGNOSIS — D631 Anemia in chronic kidney disease: Secondary | ICD-10-CM | POA: Diagnosis not present

## 2019-10-15 NOTE — Telephone Encounter (Signed)
Called and spoke with patient he states that he does not want to schedule an appointment at this time as he is going to see a Cardiologist that his other Pulmonologist had referred him to. Advised him to call the office if he wants to schedule an appointment. He expressed understanding. Nothing further needed at this time.

## 2019-10-17 DIAGNOSIS — N186 End stage renal disease: Secondary | ICD-10-CM | POA: Diagnosis not present

## 2019-10-17 DIAGNOSIS — K2961 Other gastritis with bleeding: Secondary | ICD-10-CM | POA: Diagnosis not present

## 2019-10-17 DIAGNOSIS — D631 Anemia in chronic kidney disease: Secondary | ICD-10-CM | POA: Diagnosis not present

## 2019-10-17 DIAGNOSIS — Z992 Dependence on renal dialysis: Secondary | ICD-10-CM | POA: Diagnosis not present

## 2019-10-20 DIAGNOSIS — D631 Anemia in chronic kidney disease: Secondary | ICD-10-CM | POA: Diagnosis not present

## 2019-10-20 DIAGNOSIS — N186 End stage renal disease: Secondary | ICD-10-CM | POA: Diagnosis not present

## 2019-10-20 DIAGNOSIS — Z992 Dependence on renal dialysis: Secondary | ICD-10-CM | POA: Diagnosis not present

## 2019-10-20 DIAGNOSIS — K2961 Other gastritis with bleeding: Secondary | ICD-10-CM | POA: Diagnosis not present

## 2019-10-21 DIAGNOSIS — D631 Anemia in chronic kidney disease: Secondary | ICD-10-CM | POA: Diagnosis not present

## 2019-10-21 DIAGNOSIS — Z992 Dependence on renal dialysis: Secondary | ICD-10-CM | POA: Diagnosis not present

## 2019-10-21 DIAGNOSIS — K2961 Other gastritis with bleeding: Secondary | ICD-10-CM | POA: Diagnosis not present

## 2019-10-21 DIAGNOSIS — N186 End stage renal disease: Secondary | ICD-10-CM | POA: Diagnosis not present

## 2019-10-22 DIAGNOSIS — D631 Anemia in chronic kidney disease: Secondary | ICD-10-CM | POA: Diagnosis not present

## 2019-10-22 DIAGNOSIS — N186 End stage renal disease: Secondary | ICD-10-CM | POA: Diagnosis not present

## 2019-10-22 DIAGNOSIS — K2961 Other gastritis with bleeding: Secondary | ICD-10-CM | POA: Diagnosis not present

## 2019-10-22 DIAGNOSIS — Z992 Dependence on renal dialysis: Secondary | ICD-10-CM | POA: Diagnosis not present

## 2019-10-23 ENCOUNTER — Encounter: Admit: 2019-10-23 | Discharge: 2019-10-24 | Payer: MEDICAID | Attending: Adult Health | Primary: Adult Health

## 2019-10-23 DIAGNOSIS — N186 End stage renal disease: Secondary | ICD-10-CM | POA: Diagnosis not present

## 2019-10-23 DIAGNOSIS — R0600 Dyspnea, unspecified: Secondary | ICD-10-CM | POA: Diagnosis not present

## 2019-10-23 DIAGNOSIS — I5022 Chronic systolic (congestive) heart failure: Secondary | ICD-10-CM | POA: Diagnosis not present

## 2019-10-23 DIAGNOSIS — I120 Hypertensive chronic kidney disease with stage 5 chronic kidney disease or end stage renal disease: Secondary | ICD-10-CM | POA: Diagnosis not present

## 2019-10-23 DIAGNOSIS — I10 Essential (primary) hypertension: Principal | ICD-10-CM

## 2019-10-23 MED ORDER — CLONIDINE 0.2 MG/24 HR WEEKLY TRANSDERMAL PATCH
MEDICATED_PATCH | TRANSDERMAL | 3 refills | 28.00000 days | Status: CP
Start: 2019-10-23 — End: ?

## 2019-10-23 MED ORDER — CARVEDILOL 6.25 MG TABLET
ORAL_TABLET | Freq: Two times a day (BID) | ORAL | 6 refills | 30.00000 days | Status: CP
Start: 2019-10-23 — End: 2019-11-22

## 2019-10-24 ENCOUNTER — Ambulatory Visit: Admit: 2019-10-24 | Discharge: 2019-10-25 | Payer: MEDICAID

## 2019-10-24 DIAGNOSIS — D631 Anemia in chronic kidney disease: Secondary | ICD-10-CM | POA: Diagnosis not present

## 2019-10-24 DIAGNOSIS — R0600 Dyspnea, unspecified: Secondary | ICD-10-CM | POA: Diagnosis not present

## 2019-10-24 DIAGNOSIS — N186 End stage renal disease: Secondary | ICD-10-CM | POA: Diagnosis not present

## 2019-10-24 DIAGNOSIS — Z992 Dependence on renal dialysis: Secondary | ICD-10-CM | POA: Diagnosis not present

## 2019-10-24 DIAGNOSIS — J449 Chronic obstructive pulmonary disease, unspecified: Secondary | ICD-10-CM | POA: Diagnosis not present

## 2019-10-24 DIAGNOSIS — K2961 Other gastritis with bleeding: Secondary | ICD-10-CM | POA: Diagnosis not present

## 2019-10-24 DIAGNOSIS — I272 Pulmonary hypertension, unspecified: Secondary | ICD-10-CM | POA: Diagnosis not present

## 2019-10-27 DIAGNOSIS — Z992 Dependence on renal dialysis: Secondary | ICD-10-CM | POA: Diagnosis not present

## 2019-10-27 DIAGNOSIS — D631 Anemia in chronic kidney disease: Secondary | ICD-10-CM | POA: Diagnosis not present

## 2019-10-27 DIAGNOSIS — N186 End stage renal disease: Secondary | ICD-10-CM | POA: Diagnosis not present

## 2019-10-27 DIAGNOSIS — K2961 Other gastritis with bleeding: Secondary | ICD-10-CM | POA: Diagnosis not present

## 2019-10-28 DIAGNOSIS — Z992 Dependence on renal dialysis: Secondary | ICD-10-CM | POA: Diagnosis not present

## 2019-10-28 DIAGNOSIS — N186 End stage renal disease: Secondary | ICD-10-CM | POA: Diagnosis not present

## 2019-10-28 DIAGNOSIS — D631 Anemia in chronic kidney disease: Secondary | ICD-10-CM | POA: Diagnosis not present

## 2019-10-28 DIAGNOSIS — K2961 Other gastritis with bleeding: Secondary | ICD-10-CM | POA: Diagnosis not present

## 2019-10-29 ENCOUNTER — Encounter: Admit: 2019-10-29 | Discharge: 2019-10-30 | Payer: MEDICARE

## 2019-10-29 DIAGNOSIS — K2961 Other gastritis with bleeding: Secondary | ICD-10-CM | POA: Diagnosis not present

## 2019-10-29 DIAGNOSIS — Z992 Dependence on renal dialysis: Secondary | ICD-10-CM | POA: Diagnosis not present

## 2019-10-29 DIAGNOSIS — N186 End stage renal disease: Secondary | ICD-10-CM | POA: Diagnosis not present

## 2019-10-29 DIAGNOSIS — D631 Anemia in chronic kidney disease: Secondary | ICD-10-CM | POA: Diagnosis not present

## 2019-10-29 MED ORDER — CARVEDILOL 12.5 MG TABLET
ORAL_TABLET | Freq: Two times a day (BID) | ORAL | 6 refills | 30.00000 days | Status: CP
Start: 2019-10-29 — End: ?

## 2019-10-31 ENCOUNTER — Encounter: Admit: 2019-10-31 | Discharge: 2019-11-01 | Payer: MEDICARE

## 2019-10-31 DIAGNOSIS — R0600 Dyspnea, unspecified: Secondary | ICD-10-CM | POA: Diagnosis not present

## 2019-10-31 DIAGNOSIS — K2961 Other gastritis with bleeding: Secondary | ICD-10-CM | POA: Diagnosis not present

## 2019-10-31 DIAGNOSIS — D631 Anemia in chronic kidney disease: Secondary | ICD-10-CM | POA: Diagnosis not present

## 2019-10-31 DIAGNOSIS — N186 End stage renal disease: Secondary | ICD-10-CM | POA: Diagnosis not present

## 2019-10-31 DIAGNOSIS — J449 Chronic obstructive pulmonary disease, unspecified: Secondary | ICD-10-CM | POA: Diagnosis not present

## 2019-10-31 DIAGNOSIS — Z992 Dependence on renal dialysis: Secondary | ICD-10-CM | POA: Diagnosis not present

## 2019-10-31 DIAGNOSIS — I272 Pulmonary hypertension, unspecified: Secondary | ICD-10-CM | POA: Diagnosis not present

## 2019-11-03 DIAGNOSIS — K2961 Other gastritis with bleeding: Secondary | ICD-10-CM | POA: Diagnosis not present

## 2019-11-03 DIAGNOSIS — N186 End stage renal disease: Secondary | ICD-10-CM | POA: Diagnosis not present

## 2019-11-03 DIAGNOSIS — D631 Anemia in chronic kidney disease: Secondary | ICD-10-CM | POA: Diagnosis not present

## 2019-11-03 DIAGNOSIS — Z992 Dependence on renal dialysis: Secondary | ICD-10-CM | POA: Diagnosis not present

## 2019-11-03 DIAGNOSIS — I2721 Secondary pulmonary arterial hypertension: Principal | ICD-10-CM

## 2019-11-05 ENCOUNTER — Institutional Professional Consult (permissible substitution): Admit: 2019-11-05 | Discharge: 2019-11-06 | Payer: MEDICARE

## 2019-11-05 DIAGNOSIS — N186 End stage renal disease: Secondary | ICD-10-CM | POA: Diagnosis not present

## 2019-11-05 DIAGNOSIS — K2961 Other gastritis with bleeding: Secondary | ICD-10-CM | POA: Diagnosis not present

## 2019-11-05 DIAGNOSIS — D631 Anemia in chronic kidney disease: Secondary | ICD-10-CM | POA: Diagnosis not present

## 2019-11-05 DIAGNOSIS — Z992 Dependence on renal dialysis: Secondary | ICD-10-CM | POA: Diagnosis not present

## 2019-11-05 MED ORDER — CARVEDILOL 6.25 MG TABLET
ORAL_TABLET | Freq: Two times a day (BID) | ORAL | 2 refills | 30.00000 days
Start: 2019-11-05 — End: 2019-12-05

## 2019-11-07 ENCOUNTER — Telehealth: Payer: Self-pay | Admitting: Internal Medicine

## 2019-11-07 DIAGNOSIS — R0600 Dyspnea, unspecified: Secondary | ICD-10-CM

## 2019-11-07 NOTE — Telephone Encounter (Signed)
Spoke with patient, he states he had to leave pulmonary rehab d/t some health issues, he was last at [facility] 2 months ago.  He stated that Dr. Barbera Setters office should have been forwarding our office notes from what has been going on.  I advised him that we can see the notes in Epic.  I let him know that I would send this information to Dr. Chase Caller and after we hear back from him we will give him a call back.  He verbalized understanding. Dr. Chase Caller, please advise on getting patient back into pulmonary rehab.  Thank you.

## 2019-11-08 DIAGNOSIS — Z992 Dependence on renal dialysis: Secondary | ICD-10-CM | POA: Diagnosis not present

## 2019-11-08 DIAGNOSIS — K2961 Other gastritis with bleeding: Secondary | ICD-10-CM | POA: Diagnosis not present

## 2019-11-08 DIAGNOSIS — N186 End stage renal disease: Secondary | ICD-10-CM | POA: Diagnosis not present

## 2019-11-08 DIAGNOSIS — D631 Anemia in chronic kidney disease: Secondary | ICD-10-CM | POA: Diagnosis not present

## 2019-11-09 MED ORDER — CLONIDINE 0.1 MG/24 HR WEEKLY TRANSDERMAL PATCH
TRANSDERMAL | 1 refills | 28 days | Status: CP
Start: 2019-11-09 — End: 2020-11-08

## 2019-11-10 DIAGNOSIS — D631 Anemia in chronic kidney disease: Secondary | ICD-10-CM | POA: Diagnosis not present

## 2019-11-10 DIAGNOSIS — N186 End stage renal disease: Secondary | ICD-10-CM | POA: Diagnosis not present

## 2019-11-10 DIAGNOSIS — K2961 Other gastritis with bleeding: Secondary | ICD-10-CM | POA: Diagnosis not present

## 2019-11-10 DIAGNOSIS — Z992 Dependence on renal dialysis: Secondary | ICD-10-CM | POA: Diagnosis not present

## 2019-11-10 NOTE — Telephone Encounter (Signed)
Ok to refer to [facility] rehab - indication dyspnea but Dr Marijean Bravo can refer to rehab at [facility] as well

## 2019-11-10 NOTE — Telephone Encounter (Signed)
Called and left vm for patient advising him that we are referring him to pulmonary rehab.  Advised to call back with any questions.

## 2019-11-11 DIAGNOSIS — K2961 Other gastritis with bleeding: Secondary | ICD-10-CM | POA: Diagnosis not present

## 2019-11-11 DIAGNOSIS — D631 Anemia in chronic kidney disease: Secondary | ICD-10-CM | POA: Diagnosis not present

## 2019-11-11 DIAGNOSIS — N186 End stage renal disease: Secondary | ICD-10-CM | POA: Diagnosis not present

## 2019-11-11 DIAGNOSIS — Z992 Dependence on renal dialysis: Secondary | ICD-10-CM | POA: Diagnosis not present

## 2019-11-12 DIAGNOSIS — D631 Anemia in chronic kidney disease: Secondary | ICD-10-CM | POA: Diagnosis not present

## 2019-11-12 DIAGNOSIS — K2961 Other gastritis with bleeding: Secondary | ICD-10-CM | POA: Diagnosis not present

## 2019-11-12 DIAGNOSIS — N186 End stage renal disease: Secondary | ICD-10-CM | POA: Diagnosis not present

## 2019-11-12 DIAGNOSIS — Z992 Dependence on renal dialysis: Secondary | ICD-10-CM | POA: Diagnosis not present

## 2019-11-13 ENCOUNTER — Encounter: Admit: 2019-11-13 | Discharge: 2019-11-14 | Payer: MEDICARE

## 2019-11-13 DIAGNOSIS — Z992 Dependence on renal dialysis: Secondary | ICD-10-CM | POA: Diagnosis not present

## 2019-11-13 DIAGNOSIS — N186 End stage renal disease: Secondary | ICD-10-CM | POA: Diagnosis not present

## 2019-11-14 DIAGNOSIS — Z23 Encounter for immunization: Secondary | ICD-10-CM | POA: Diagnosis not present

## 2019-11-14 DIAGNOSIS — N186 End stage renal disease: Secondary | ICD-10-CM | POA: Diagnosis not present

## 2019-11-14 DIAGNOSIS — K2961 Other gastritis with bleeding: Secondary | ICD-10-CM | POA: Diagnosis not present

## 2019-11-14 DIAGNOSIS — Z992 Dependence on renal dialysis: Secondary | ICD-10-CM | POA: Diagnosis not present

## 2019-11-17 DIAGNOSIS — N186 End stage renal disease: Secondary | ICD-10-CM | POA: Diagnosis not present

## 2019-11-17 DIAGNOSIS — K2961 Other gastritis with bleeding: Secondary | ICD-10-CM | POA: Diagnosis not present

## 2019-11-17 DIAGNOSIS — Z992 Dependence on renal dialysis: Secondary | ICD-10-CM | POA: Diagnosis not present

## 2019-11-17 DIAGNOSIS — Z23 Encounter for immunization: Secondary | ICD-10-CM | POA: Diagnosis not present

## 2019-11-18 ENCOUNTER — Other Ambulatory Visit: Payer: Self-pay

## 2019-11-18 ENCOUNTER — Encounter: Payer: Medicare Other | Attending: Internal Medicine

## 2019-11-18 DIAGNOSIS — Z23 Encounter for immunization: Secondary | ICD-10-CM | POA: Diagnosis not present

## 2019-11-18 DIAGNOSIS — Z992 Dependence on renal dialysis: Secondary | ICD-10-CM | POA: Diagnosis not present

## 2019-11-18 DIAGNOSIS — K2961 Other gastritis with bleeding: Secondary | ICD-10-CM | POA: Diagnosis not present

## 2019-11-18 DIAGNOSIS — I2721 Secondary pulmonary arterial hypertension: Secondary | ICD-10-CM

## 2019-11-18 DIAGNOSIS — N186 End stage renal disease: Secondary | ICD-10-CM | POA: Diagnosis not present

## 2019-11-18 NOTE — Progress Notes (Signed)
Virtual Visit completed. Patient informed on EP and RD appointment and 6 Minute walk test. Patient also informed of patient health questionnaires on My Chart. Patient Verbalizes understanding. Visit diagnosis can be found in [facility] 11/03/2019.

## 2019-11-19 ENCOUNTER — Institutional Professional Consult (permissible substitution): Admit: 2019-11-19 | Discharge: 2019-11-20 | Payer: MEDICARE

## 2019-11-19 DIAGNOSIS — N186 End stage renal disease: Secondary | ICD-10-CM | POA: Diagnosis not present

## 2019-11-19 DIAGNOSIS — K2961 Other gastritis with bleeding: Secondary | ICD-10-CM | POA: Diagnosis not present

## 2019-11-19 DIAGNOSIS — Z992 Dependence on renal dialysis: Secondary | ICD-10-CM | POA: Diagnosis not present

## 2019-11-19 DIAGNOSIS — Z23 Encounter for immunization: Secondary | ICD-10-CM | POA: Diagnosis not present

## 2019-11-20 ENCOUNTER — Other Ambulatory Visit: Payer: Self-pay

## 2019-11-20 VITALS — Ht 73.2 in | Wt 189.1 lb

## 2019-11-20 DIAGNOSIS — I2721 Secondary pulmonary arterial hypertension: Secondary | ICD-10-CM | POA: Diagnosis not present

## 2019-11-20 NOTE — Patient Instructions (Signed)
Patient Instructions  Patient Details  Name: Louis [name] MRN: [id number] Date of Birth: 1960/04/27 Referring Provider:  French Ana, MD  Below are your personal goals for exercise, nutrition, and risk factors. Our goal is to help you stay on track towards obtaining and maintaining these goals. We will be discussing your progress on these goals with you throughout the program.  Initial Exercise Prescription:  Initial Exercise Prescription - 11/20/19 1000      Date of Initial Exercise RX and Referring Provider   Date 11/20/19    Referring Provider Romona Curls MD      Treadmill   MPH 1.8    Grade 0    Minutes 15    METs 2.3      NuStep   Level 1    SPM 80    Minutes 15    METs 3.4      REL-XR   Level 1    Speed 50    Minutes 15    METs 3.4      Biostep-RELP   Level 1    SPM 50    Minutes 15    METs 3.4      Prescription Details   Frequency (times per week) 2    Duration Progress to 30 minutes of continuous aerobic without signs/symptoms of physical distress      Intensity   THRR 40-80% of Max Heartrate 115-147    Ratings of Perceived Exertion 11-13    Perceived Dyspnea 0-4      Progression   Progression Continue to progress workloads to maintain intensity without signs/symptoms of physical distress.      Resistance Training   Training Prescription Yes    Weight 4 lb    Reps 10-15           Exercise Goals: Frequency: Be able to perform aerobic exercise two to three times per week in program working toward 2-5 days per week of home exercise.  Intensity: Work with a perceived exertion of 11 (fairly light) - 15 (hard) while following your exercise prescription.  We will make changes to your prescription with you as you progress through the program.   Duration: Be able to do 30 to 45 minutes of continuous aerobic exercise in addition to a 5 minute warm-up and a 5 minute cool-down routine.   Nutrition Goals: Your personal nutrition goals will  be established when you do your nutrition analysis with the dietician.  The following are general nutrition guidelines to follow: Cholesterol < 200mg /day Sodium < 1500mg /day Fiber: Men over 50 yrs - 30 grams per day  Personal Goals:  Personal Goals and Risk Factors at Admission - 11/20/19 1022      Core Components/Risk Factors/Patient Goals on Admission    Weight Management Yes;Weight Maintenance    Intervention Weight Management: Develop a combined nutrition and exercise program designed to reach desired caloric intake, while maintaining appropriate intake of nutrient and fiber, sodium and fats, and appropriate energy expenditure required for the weight goal.;Weight Management: Provide education and appropriate resources to help participant work on and attain dietary goals.;Weight Management/Obesity: Establish reasonable short term and long term weight goals.    Admit Weight 189 lb 1.6 oz (85.8 kg)    Goal Weight: Short Term 189 lb 1.6 oz (85.8 kg)    Goal Weight: Long Term 189 lb 1.6 oz (85.8 kg)    Expected Outcomes Short Term: Continue to assess and modify interventions until short term weight is achieved;Long Term:  Adherence to nutrition and physical activity/exercise program aimed toward attainment of established weight goal;Weight Maintenance: Understanding of the daily nutrition guidelines, which includes 25-35% calories from fat, 7% or less cal from saturated fats, less than 200mg  cholesterol, less than 1.5gm of sodium, & 5 or more servings of fruits and vegetables daily;Understanding recommendations for meals to include 15-35% energy as protein, 25-35% energy from fat, 35-60% energy from carbohydrates, less than 200mg  of dietary cholesterol, 20-35 gm of total fiber daily;Understanding of distribution of calorie intake throughout the day with the consumption of 4-5 meals/snacks    Improve shortness of breath with ADL's Yes    Intervention Provide education, individualized exercise plan and  daily activity instruction to help decrease symptoms of SOB with activities of daily living.    Expected Outcomes Short Term: Improve cardiorespiratory fitness to achieve a reduction of symptoms when performing ADLs;Long Term: Be able to perform more ADLs without symptoms or delay the onset of symptoms    Intervention Provide a combined exercise and nutrition program that is supplemented with education, support and counseling about heart failure. Directed toward relieving symptoms such as shortness of breath, decreased exercise tolerance, and extremity edema.    Expected Outcomes Improve functional capacity of life;Short term: Attendance in program 2-3 days a week with increased exercise capacity. Reported lower sodium intake. Reported increased fruit and vegetable intake. Reports medication compliance.;Short term: Daily weights obtained and reported for increase. Utilizing diuretic protocols set by physician.;Long term: Adoption of self-care skills and reduction of barriers for early signs and symptoms recognition and intervention leading to self-care maintenance.    Hypertension Yes    Intervention Provide education on lifestyle modifcations including regular physical activity/exercise, weight management, moderate sodium restriction and increased consumption of fresh fruit, vegetables, and low fat dairy, alcohol moderation, and smoking cessation.;Monitor prescription use compliance.    Expected Outcomes Short Term: Continued assessment and intervention until BP is < 140/66mm HG in hypertensive participants. < 130/55mm HG in hypertensive participants with diabetes, heart failure or chronic kidney disease.;Long Term: Maintenance of blood pressure at goal levels.           Tobacco Use Initial Evaluation: Social History   Tobacco Use  Smoking Status Never Smoker  Smokeless Tobacco Never Used    Exercise Goals and Review:  Exercise Goals    Row Name 11/20/19 1021             Exercise Goals    Increase Physical Activity Yes       Intervention Provide advice, education, support and counseling about physical activity/exercise needs.;Develop an individualized exercise prescription for aerobic and resistive training based on initial evaluation findings, risk stratification, comorbidities and participant's personal goals.       Expected Outcomes Short Term: Attend rehab on a regular basis to increase amount of physical activity.;Long Term: Add in home exercise to make exercise part of routine and to increase amount of physical activity.;Long Term: Exercising regularly at least 3-5 days a week.       Increase Strength and Stamina Yes       Intervention Provide advice, education, support and counseling about physical activity/exercise needs.;Develop an individualized exercise prescription for aerobic and resistive training based on initial evaluation findings, risk stratification, comorbidities and participant's personal goals.       Expected Outcomes Short Term: Increase workloads from initial exercise prescription for resistance, speed, and METs.;Short Term: Perform resistance training exercises routinely during rehab and add in resistance training at home;Long Term: Improve cardiorespiratory fitness,  muscular endurance and strength as measured by increased METs and functional capacity (6MWT)       Able to understand and use rate of perceived exertion (RPE) scale Yes       Intervention Provide education and explanation on how to use RPE scale       Expected Outcomes Short Term: Able to use RPE daily in rehab to express subjective intensity level;Long Term:  Able to use RPE to guide intensity level when exercising independently       Able to understand and use Dyspnea scale Yes       Intervention Provide education and explanation on how to use Dyspnea scale       Expected Outcomes Short Term: Able to use Dyspnea scale daily in rehab to express subjective sense of shortness of breath during  exertion;Long Term: Able to use Dyspnea scale to guide intensity level when exercising independently       Knowledge and understanding of Target Heart Rate Range (THRR) Yes       Intervention Provide education and explanation of THRR including how the numbers were predicted and where they are located for reference       Expected Outcomes Short Term: Able to state/look up THRR;Short Term: Able to use daily as guideline for intensity in rehab;Long Term: Able to use THRR to govern intensity when exercising independently       Able to check pulse independently Yes       Intervention Provide education and demonstration on how to check pulse in carotid and radial arteries.;Review the importance of being able to check your own pulse for safety during independent exercise       Expected Outcomes Short Term: Able to explain why pulse checking is important during independent exercise;Long Term: Able to check pulse independently and accurately       Understanding of Exercise Prescription Yes       Intervention Provide education, explanation, and written materials on patient's individual exercise prescription       Expected Outcomes Short Term: Able to explain program exercise prescription;Long Term: Able to explain home exercise prescription to exercise independently              Copy of goals given to participant.

## 2019-11-20 NOTE — Progress Notes (Signed)
Pulmonary Individual Treatment Plan  Patient Details  Name: Louis [name] MRN: [id number] Date of Birth: Feb 03, 1961 Referring Provider:     Pulmonary Rehab from 11/20/2019 in [facility] Cardiac and Pulmonary Rehab  Referring Provider Romona Curls MD      Initial Encounter Date:    Pulmonary Rehab from 11/20/2019 in [facility] Cardiac and Pulmonary Rehab  Date 11/20/19      Visit Diagnosis: PAH (pulmonary artery hypertension) ([place])  Patient's Home Medications on Admission:  Current Outpatient Medications:  .  amLODipine (NORVASC) 10 MG tablet, Take 1 tablet (10 mg total) by mouth daily. (Patient not taking: Reported on 11/18/2019), Disp: 30 tablet, Rfl: 0 .  AURYXIA 1 GM 210 MG(Fe) tablet, Take 1 tablet by mouth 4 (four) times daily -  before meals and at bedtime., Disp: , Rfl:  .  B Complex-C-Folic Acid (RENA-VITE RX) 1 MG TABS, Take 1 tablet by mouth daily., Disp: , Rfl:  .  budesonide-formoterol (SYMBICORT) 80-4.5 MCG/ACT inhaler, Inhale 2 puffs into the lungs 2 (two) times daily. (Patient not taking: Reported on 11/18/2019), Disp: 1 Inhaler, Rfl: 12 .  carvedilol (COREG) 25 MG tablet, Take 1 tablet (25 mg total) by mouth 2 (two) times daily., Disp: 180 tablet, Rfl: 3 .  carvedilol (COREG) 6.25 MG tablet, Take 6.25 mg by mouth. (Patient not taking: Reported on 11/18/2019), Disp: , Rfl:  .  cetirizine (ZYRTEC) 10 MG tablet, Take 1 tablet (10 mg total) by mouth daily. (Patient not taking: Reported on 10/02/2019), Disp: 30 tablet, Rfl: 11 .  cloNIDine (CATAPRES - DOSED IN MG/24 HR) 0.3 mg/24hr patch, Place 1 patch (0.3 mg total) onto the skin once a week. (Patient not taking: Reported on 11/18/2019), Disp: 4 patch, Rfl: 5 .  DESCOVY 200-25 MG tablet, Take 1 tablet by mouth daily., Disp: , Rfl:  .  diclofenac sodium (VOLTAREN) 1 % GEL, Apply 2 g topically 4 (four) times daily. (Patient not taking: Reported on 10/02/2019), Disp: 100 g, Rfl: 0 .  Febuxostat 80 MG TABS, Take 1 tablet by mouth daily.  (Patient not taking: Reported on 10/02/2019), Disp: , Rfl:  .  ferrous sulfate 325 (65 FE) MG tablet, Take 1 tablet by mouth daily. (Patient not taking: Reported on 11/18/2019), Disp: , Rfl:  .  fluticasone (FLONASE) 50 MCG/ACT nasal spray, Place into the nose. (Patient not taking: Reported on 10/02/2019), Disp: , Rfl:  .  furosemide (LASIX) 40 MG tablet, Take 80 mg by mouth daily.  (Patient not taking: Reported on 10/02/2019), Disp: , Rfl:  .  hydrALAZINE (APRESOLINE) 100 MG tablet, Take 150 mg by mouth 3 (three) times daily. (Patient not taking: Reported on 11/18/2019), Disp: , Rfl:  .  methocarbamol (ROBAXIN) 500 MG tablet, TAKE 1 TABLET (500 MG TOTAL) BY MOUTH 2 (TWO) TIMES DAILY AS NEEDED. FOR PAIN (Patient not taking: Reported on 11/18/2019), Disp: 60 tablet, Rfl: 0 .  ondansetron (ZOFRAN-ODT) 4 MG disintegrating tablet, Take 4 mg by mouth every 8 (eight) hours as needed. (Patient not taking: Reported on 10/02/2019), Disp: , Rfl:  .  predniSONE (DELTASONE) 10 MG tablet, Take 1 tablet (10 mg total) by mouth 2 (two) times daily as needed. Prn gout attack for 2 days, Disp: 10 tablet, Rfl: 0 .  sevelamer carbonate (RENVELA) 800 MG tablet, Take 800 mg by mouth 3 (three) times daily. (Patient not taking: Reported on 10/02/2019), Disp: , Rfl:  .  Tiotropium Bromide Monohydrate (SPIRIVA RESPIMAT) 1.25 MCG/ACT AERS, INHALE 2 PUFFS BY MOUTH INTO THE  LUNGS DAILY, Disp: 4 g, Rfl: 3 .  TIVICAY 50 MG tablet, Take 1 tablet by mouth daily., Disp: , Rfl:  .  valACYclovir (VALTREX) 500 MG tablet, Take 500 mg by mouth daily as needed (for flare ups). , Disp: , Rfl:   Past Medical History: Past Medical History:  Diagnosis Date  . Chronic kidney disease    esrd  . Dermatophytosis of foot 06/23/2004  . Environmental allergies   . ESRD (end stage renal disease) on dialysis (HCC)   . Gout   . HIV infection (HCC) 12/1995  . Hypertension   . Seborrhea 06/23/2004    Tobacco Use: Social History   Tobacco Use  Smoking  Status Never Smoker  Smokeless Tobacco Never Used    Labs: Recent Review Advice worker    Labs for ITP Cardiac and Pulmonary Rehab Latest Ref Rng & Units 06/09/2010 08/09/2011 08/23/2016   Cholestrol 0 - 200 mg/dL 962 952 -   LDLCALC 0 - 99 mg/dL 841(L) 244(W) -   HDL >10 mg/dL 55 54 -   Trlycerides <272 mg/dL 69 89 -   Hemoglobin Z3G 4.8 - 5.6 % - - 5.1       Pulmonary Assessment Scores:  Pulmonary Assessment Scores    Row Name 11/20/19 1008         ADL UCSD   ADL Phase Entry     SOB Score total 5     Rest 0     Walk 0     Stairs 0     Bath 0     Dress 0     Shop 0       CAT Score   CAT Score 8       mMRC Score   mMRC Score 1            UCSD: Self-administered rating of dyspnea associated with activities of daily living (ADLs) 6-point scale (0 = "not at all" to 5 = "maximal or unable to do because of breathlessness")  Scoring Scores range from 0 to 120.  Minimally important difference is 5 units  CAT: CAT can identify the health impairment of COPD patients and is better correlated with disease progression.  CAT has a scoring range of zero to 40. The CAT score is classified into four groups of low (less than 10), medium (10 - 20), high (21-30) and very high (31-40) based on the impact level of disease on health status. A CAT score over 10 suggests significant symptoms.  A worsening CAT score could be explained by an exacerbation, poor medication adherence, poor inhaler technique, or progression of COPD or comorbid conditions.  CAT MCID is 2 points  mMRC: mMRC (Modified Medical Research Council) Dyspnea Scale is used to assess the degree of baseline functional disability in patients of respiratory disease due to dyspnea. No minimal important difference is established. A decrease in score of 1 point or greater is considered a positive change.   Pulmonary Function Assessment:  Pulmonary Function Assessment - 11/18/19 0838      Breath   Shortness of Breath  Limiting activity;Yes           Exercise Target Goals: Exercise Program Goal: Individual exercise prescription set using results from initial 6 min walk test and THRR while considering  patient's activity barriers and safety.   Exercise Prescription Goal: Initial exercise prescription builds to 30-45 minutes a day of aerobic activity, 2-3 days per week.  Home exercise guidelines will be given to  patient during program as part of exercise prescription that the participant will acknowledge.  Education: Aerobic Exercise & Resistance Training: - Gives group verbal and written instruction on the various components of exercise. Focuses on aerobic and resistive training programs and the benefits of this training and how to safely progress through these programs..   Education: Exercise & Equipment Safety: - Individual verbal instruction and demonstration of equipment use and safety with use of the equipment.   Pulmonary Rehab from 11/18/2019 in [facility] Cardiac and Pulmonary Rehab  Date 11/18/19  Educator [facility]  Instruction Review Code 1- Verbalizes Understanding      Education: Exercise Physiology & General Exercise Guidelines: - Group verbal and written instruction with models to review the exercise physiology of the cardiovascular system and associated critical values. Provides general exercise guidelines with specific guidelines to those with heart or lung disease.    Education: Flexibility, Balance, Mind/Body Relaxation: Provides group verbal/written instruction on the benefits of flexibility and balance training, including mind/body exercise modes such as yoga, pilates and tai chi.  Demonstration and skill practice provided.   Activity Barriers & Risk Stratification:  Activity Barriers & Cardiac Risk Stratification - 11/20/19 1005      Activity Barriers & Cardiac Risk Stratification   Activity Barriers Shortness of Breath           6 Minute Walk:  6 Minute Walk    Row Name  11/20/19 1009         6 Minute Walk   Phase Initial     Distance 1045 feet     Walk Time 5.2 minutes  Stopped at 5:15 due to O2 desaturation     # of Rest Breaks 0     MPH 2.28     METS 3.43     RPE 11     Perceived Dyspnea  0     VO2 Peak 12.03     Symptoms No     Resting HR 84 bpm     Resting BP 118/74     Resting Oxygen Saturation  98 %     Exercise Oxygen Saturation  during 6 min walk 79 %  Stopped 6MWT at 5:15 minutes     Max Ex. HR 93 bpm     Max Ex. BP 140/76     2 Minute Post BP 120/74       Interval HR   1 Minute HR 88     2 Minute HR 91     3 Minute HR 91     4 Minute HR 89     5 Minute HR 91     6 Minute HR 93     2 Minute Post HR 87     Interval Heart Rate? Yes       Interval Oxygen   Interval Oxygen? Yes     Baseline Oxygen Saturation % 98 %     1 Minute Oxygen Saturation % 94 %     1 Minute Liters of Oxygen 0 L     2 Minute Oxygen Saturation % 88 %     2 Minute Liters of Oxygen 0 L     3 Minute Oxygen Saturation % 81 %     3 Minute Liters of Oxygen 0 L     4 Minute Oxygen Saturation % 88 %     4 Minute Liters of Oxygen 0 L     5 Minute Oxygen Saturation % 83 %  5 Minute Liters of Oxygen 0 L     6 Minute Oxygen Saturation % 79 %     6 Minute Liters of Oxygen 0 L     2 Minute Post Oxygen Saturation % 94 %     2 Minute Post Liters of Oxygen 0 L           Oxygen Initial Assessment:  Oxygen Initial Assessment - 11/20/19 1007      Home Oxygen   Home Oxygen Device None    Sleep Oxygen Prescription None    Home Exercise Oxygen Prescription None    Home Resting Oxygen Prescription None      Initial 6 min Walk   Oxygen Used None      Program Oxygen Prescription   Program Oxygen Prescription None      Intervention   Short Term Goals To learn and understand importance of monitoring SPO2 with pulse oximeter and demonstrate accurate use of the pulse oximeter.;To learn and understand importance of maintaining oxygen saturations>88%;To learn and  demonstrate proper pursed lip breathing techniques or other breathing techniques.;To learn and demonstrate proper use of respiratory medications    Long  Term Goals Verbalizes importance of monitoring SPO2 with pulse oximeter and return demonstration;Maintenance of O2 saturations>88%;Exhibits proper breathing techniques, such as pursed lip breathing or other method taught during program session;Compliance with respiratory medication;Demonstrates proper use of MDI's           Oxygen Re-Evaluation:   Oxygen Discharge (Final Oxygen Re-Evaluation):   Initial Exercise Prescription:  Initial Exercise Prescription - 11/20/19 1000      Date of Initial Exercise RX and Referring Provider   Date 11/20/19    Referring Provider Romona Curls MD      Treadmill   MPH 1.8    Grade 0    Minutes 15    METs 2.3      NuStep   Level 1    SPM 80    Minutes 15    METs 3.4      REL-XR   Level 1    Speed 50    Minutes 15    METs 3.4      Biostep-RELP   Level 1    SPM 50    Minutes 15    METs 3.4      Prescription Details   Frequency (times per week) 2    Duration Progress to 30 minutes of continuous aerobic without signs/symptoms of physical distress      Intensity   THRR 40-80% of Max Heartrate 115-147    Ratings of Perceived Exertion 11-13    Perceived Dyspnea 0-4      Progression   Progression Continue to progress workloads to maintain intensity without signs/symptoms of physical distress.      Resistance Training   Training Prescription Yes    Weight 4 lb    Reps 10-15           Perform Capillary Blood Glucose checks as needed.  Exercise Prescription Changes:  Exercise Prescription Changes    Row Name 11/20/19 1000             Response to Exercise   Blood Pressure (Admit) 118/74       Blood Pressure (Exercise) 140/76       Blood Pressure (Exit) 124/82       Heart Rate (Admit) 84 bpm       Heart Rate (Exercise) 93 bpm       Heart Rate (  Exit) 80 bpm        Oxygen Saturation (Admit) 98 %       Oxygen Saturation (Exercise) 79 %  Stopped 6MWT due to O2 desaturation       Oxygen Saturation (Exit) 98 %       Rating of Perceived Exertion (Exercise) 11       Perceived Dyspnea (Exercise) 0       Symptoms none       Comments walk test results       Duration Progress to 30 minutes of  aerobic without signs/symptoms of physical distress              Exercise Comments:   Exercise Goals and Review:  Exercise Goals    Row Name 11/20/19 1021             Exercise Goals   Increase Physical Activity Yes       Intervention Provide advice, education, support and counseling about physical activity/exercise needs.;Develop an individualized exercise prescription for aerobic and resistive training based on initial evaluation findings, risk stratification, comorbidities and participant's personal goals.       Expected Outcomes Short Term: Attend rehab on a regular basis to increase amount of physical activity.;Long Term: Add in home exercise to make exercise part of routine and to increase amount of physical activity.;Long Term: Exercising regularly at least 3-5 days a week.       Increase Strength and Stamina Yes       Intervention Provide advice, education, support and counseling about physical activity/exercise needs.;Develop an individualized exercise prescription for aerobic and resistive training based on initial evaluation findings, risk stratification, comorbidities and participant's personal goals.       Expected Outcomes Short Term: Increase workloads from initial exercise prescription for resistance, speed, and METs.;Short Term: Perform resistance training exercises routinely during rehab and add in resistance training at home;Long Term: Improve cardiorespiratory fitness, muscular endurance and strength as measured by increased METs and functional capacity (6MWT)       Able to understand and use rate of perceived exertion (RPE) scale Yes        Intervention Provide education and explanation on how to use RPE scale       Expected Outcomes Short Term: Able to use RPE daily in rehab to express subjective intensity level;Long Term:  Able to use RPE to guide intensity level when exercising independently       Able to understand and use Dyspnea scale Yes       Intervention Provide education and explanation on how to use Dyspnea scale       Expected Outcomes Short Term: Able to use Dyspnea scale daily in rehab to express subjective sense of shortness of breath during exertion;Long Term: Able to use Dyspnea scale to guide intensity level when exercising independently       Knowledge and understanding of Target Heart Rate Range (THRR) Yes       Intervention Provide education and explanation of THRR including how the numbers were predicted and where they are located for reference       Expected Outcomes Short Term: Able to state/look up THRR;Short Term: Able to use daily as guideline for intensity in rehab;Long Term: Able to use THRR to govern intensity when exercising independently       Able to check pulse independently Yes       Intervention Provide education and demonstration on how to check pulse in carotid and radial arteries.;Review the importance  of being able to check your own pulse for safety during independent exercise       Expected Outcomes Short Term: Able to explain why pulse checking is important during independent exercise;Long Term: Able to check pulse independently and accurately       Understanding of Exercise Prescription Yes       Intervention Provide education, explanation, and written materials on patient's individual exercise prescription       Expected Outcomes Short Term: Able to explain program exercise prescription;Long Term: Able to explain home exercise prescription to exercise independently              Exercise Goals Re-Evaluation :   Discharge Exercise Prescription (Final Exercise Prescription Changes):   Exercise Prescription Changes - 11/20/19 1000      Response to Exercise   Blood Pressure (Admit) 118/74    Blood Pressure (Exercise) 140/76    Blood Pressure (Exit) 124/82    Heart Rate (Admit) 84 bpm    Heart Rate (Exercise) 93 bpm    Heart Rate (Exit) 80 bpm    Oxygen Saturation (Admit) 98 %    Oxygen Saturation (Exercise) 79 %   Stopped 6MWT due to O2 desaturation   Oxygen Saturation (Exit) 98 %    Rating of Perceived Exertion (Exercise) 11    Perceived Dyspnea (Exercise) 0    Symptoms none    Comments walk test results    Duration Progress to 30 minutes of  aerobic without signs/symptoms of physical distress           Nutrition:  Target Goals: Understanding of nutrition guidelines, daily intake of sodium '1500mg'$ , cholesterol '200mg'$ , calories 30% from fat and 7% or less from saturated fats, daily to have 5 or more servings of fruits and vegetables.  Education: Controlling Sodium/Reading Food Labels -Group verbal and written material supporting the discussion of sodium use in heart healthy nutrition. Review and explanation with models, verbal and written materials for utilization of the food label.   Education: General Nutrition Guidelines/Fats and Fiber: -Group instruction provided by verbal, written material, models and posters to present the general guidelines for heart healthy nutrition. Gives an explanation and review of dietary fats and fiber.   Biometrics:  Pre Biometrics - 11/20/19 1003      Pre Biometrics   Height 6' 1.2" (1.859 m)    Weight 189 lb 1.6 oz (85.8 kg)    BMI (Calculated) 24.82    Single Leg Stand 26 seconds            Nutrition Therapy Plan and Nutrition Goals:   Nutrition Assessments:  Nutrition Assessments - 11/20/19 1007      MEDFICTS Scores   Pre Score 42           MEDIFICTS Score Key:          ?70 Need to make dietary changes          40-70 Heart Healthy Diet         ? 40 Therapeutic Level Cholesterol Diet  Nutrition Goals  Re-Evaluation:   Nutrition Goals Discharge (Final Nutrition Goals Re-Evaluation):   Psychosocial: Target Goals: Acknowledge presence or absence of significant depression and/or stress, maximize coping skills, provide positive support system. Participant is able to verbalize types and ability to use techniques and skills needed for reducing stress and depression.   Education: Depression - Provides group verbal and written instruction on the correlation between heart/lung disease and depressed mood, treatment options, and the stigmas associated with  seeking treatment.   Education: Sleep Hygiene -Provides group verbal and written instruction about how sleep can affect your health.  Define sleep hygiene, discuss sleep cycles and impact of sleep habits. Review good sleep hygiene tips.    Education: Stress and Anxiety: - Provides group verbal and written instruction about the health risks of elevated stress and causes of high stress.  Discuss the correlation between heart/lung disease and anxiety and treatment options. Review healthy ways to manage with stress and anxiety.   Initial Review & Psychosocial Screening:  Initial Psych Review & Screening - 11/18/19 0838      Initial Review   Current issues with None Identified      Family Dynamics   Good Support System? Yes    Comments Patient reports no issues with their current mental states, sleep, stress, depression or anxiety. Will follow up with patient in a few weeks for any changes.      Barriers   Psychosocial barriers to participate in program There are no identifiable barriers or psychosocial needs.      Screening Interventions   Interventions Encouraged to exercise;To provide support and resources with identified psychosocial needs;Provide feedback about the scores to participant    Expected Outcomes Short Term goal: Utilizing psychosocial counselor, staff and physician to assist with identification of specific Stressors or  current issues interfering with healing process. Setting desired goal for each stressor or current issue identified.;Long Term Goal: Stressors or current issues are controlled or eliminated.;Short Term goal: Identification and review with participant of any Quality of Life or Depression concerns found by scoring the questionnaire.;Long Term goal: The participant improves quality of Life and PHQ9 Scores as seen by post scores and/or verbalization of changes           Quality of Life Scores:  Scores of 19 and below usually indicate a poorer quality of life in these areas.  A difference of  2-3 points is a clinically meaningful difference.  A difference of 2-3 points in the total score of the Quality of Life Index has been associated with significant improvement in overall quality of life, self-image, physical symptoms, and general health in studies assessing change in quality of life.  PHQ-9: Recent Review Flowsheet Data    Depression screen [facility] 2/9 11/20/2019 10/02/2019 08/08/2019 04/28/2019 04/21/2019   Decreased Interest 3  0 0 0 0   Down, Depressed, Hopeless 0 0 0 0 0   PHQ - 2 Score 3 0 0 0 0   Altered sleeping 0 - 0 1 1   Tired, decreased energy 2 - 0 1 2   Change in appetite 0 - 0 0 0   Feeling bad or failure about yourself  0 - 0 0 0   Trouble concentrating 0 - 0 0 0   Moving slowly or fidgety/restless 0 - 0 0 0   Suicidal thoughts 0 - 0 0 0   PHQ-9 Score 5 - 0 2 3   Difficult doing work/chores Not difficult at all - - Not difficult at all Not difficult at all     Interpretation of Total Score  Total Score Depression Severity:  1-4 = Minimal depression, 5-9 = Mild depression, 10-14 = Moderate depression, 15-19 = Moderately severe depression, 20-27 = Severe depression   Psychosocial Evaluation and Intervention:  Psychosocial Evaluation - 11/18/19 0849      Psychosocial Evaluation & Interventions   Interventions Encouraged to exercise with the program and follow exercise  prescription;Relaxation education;Stress management education  Comments Patient reports no issues with their current mental states, sleep, stress, depression or anxiety. Will follow up with patient in a few weeks for any changes.    Expected Outcomes Short: Exercise regularly to support mental health and notify staff of any changes. Long: maintain mental health and well being through teaching of rehab or prescribed medications independently.    Continue Psychosocial Services  Follow up required by staff           Psychosocial Re-Evaluation:   Psychosocial Discharge (Final Psychosocial Re-Evaluation):   Education: Education Goals: Education classes will be provided on a weekly basis, covering required topics. Participant will state understanding/return demonstration of topics presented.  Learning Barriers/Preferences:  Learning Barriers/Preferences - 11/18/19 0837      Learning Barriers/Preferences   Learning Barriers None    Learning Preferences None           General Pulmonary Education Topics:  Infection Prevention: - Provides verbal and written material to individual with discussion of infection control including proper hand washing and proper equipment cleaning during exercise session.   Pulmonary Rehab from 11/18/2019 in [facility] Cardiac and Pulmonary Rehab  Date 11/18/19  Educator [facility]  Instruction Review Code 1- Verbalizes Understanding      Falls Prevention: - Provides verbal and written material to individual with discussion of falls prevention and safety.   Pulmonary Rehab from 11/18/2019 in [facility] Cardiac and Pulmonary Rehab  Date 11/18/19  Educator [facility]  Instruction Review Code 1- Verbalizes Understanding      Chronic Lung Diseases: - Group verbal and written instruction to review updates, respiratory medications, advancements in procedures and treatments. Discuss use of supplemental oxygen including available portable oxygen systems, continuous and intermittent  flow rates, concentrators, personal use and safety guidelines. Review proper use of inhaler and spacers. Provide informative websites for self-education.    Energy Conservation: - Provide group verbal and written instruction for methods to conserve energy, plan and organize activities. Instruct on pacing techniques, use of adaptive equipment and posture/positioning to relieve shortness of breath.   Triggers and Exacerbations: - Group verbal and written instruction to review types of environmental triggers and ways to prevent exacerbations. Discuss weather changes, air quality and the benefits of nasal washing. Review warning signs and symptoms to help prevent infections. Discuss techniques for effective airway clearance, coughing, and vibrations.   AED/CPR: - Group verbal and written instruction with the use of models to demonstrate the basic use of the AED with the basic ABC's of resuscitation.   Anatomy and Physiology of the Lungs: - Group verbal and written instruction with the use of models to provide basic lung anatomy and physiology related to function, structure and complications of lung disease.   Anatomy & Physiology of the Heart: - Group verbal and written instruction and models provide basic cardiac anatomy and physiology, with the coronary electrical and arterial systems. Review of Valvular disease and Heart Failure   Cardiac Medications: - Group verbal and written instruction to review commonly prescribed medications for heart disease. Reviews the medication, class of the drug, and side effects.   Other: -Provides group and verbal instruction on various topics (see comments)   Knowledge Questionnaire Score:  Knowledge Questionnaire Score - 11/20/19 1006      Knowledge Questionnaire Score   Pre Score 17/18: Oxygen            Core Components/Risk Factors/Patient Goals at Admission:  Personal Goals and Risk Factors at Admission - 11/20/19 1022  Core  Components/Risk Factors/Patient Goals on Admission    Weight Management Yes;Weight Maintenance    Intervention Weight Management: Develop a combined nutrition and exercise program designed to reach desired caloric intake, while maintaining appropriate intake of nutrient and fiber, sodium and fats, and appropriate energy expenditure required for the weight goal.;Weight Management: Provide education and appropriate resources to help participant work on and attain dietary goals.;Weight Management/Obesity: Establish reasonable short term and long term weight goals.    Admit Weight 189 lb 1.6 oz (85.8 kg)    Goal Weight: Short Term 189 lb 1.6 oz (85.8 kg)    Goal Weight: Long Term 189 lb 1.6 oz (85.8 kg)    Expected Outcomes Short Term: Continue to assess and modify interventions until short term weight is achieved;Long Term: Adherence to nutrition and physical activity/exercise program aimed toward attainment of established weight goal;Weight Maintenance: Understanding of the daily nutrition guidelines, which includes 25-35% calories from fat, 7% or less cal from saturated fats, less than $RemoveB'200mg'cPDVIoKm$  cholesterol, less than 1.5gm of sodium, & 5 or more servings of fruits and vegetables daily;Understanding recommendations for meals to include 15-35% energy as protein, 25-35% energy from fat, 35-60% energy from carbohydrates, less than $RemoveB'200mg'htdHmJDH$  of dietary cholesterol, 20-35 gm of total fiber daily;Understanding of distribution of calorie intake throughout the day with the consumption of 4-5 meals/snacks    Improve shortness of breath with ADL's Yes    Intervention Provide education, individualized exercise plan and daily activity instruction to help decrease symptoms of SOB with activities of daily living.    Expected Outcomes Short Term: Improve cardiorespiratory fitness to achieve a reduction of symptoms when performing ADLs;Long Term: Be able to perform more ADLs without symptoms or delay the onset of symptoms     Intervention Provide a combined exercise and nutrition program that is supplemented with education, support and counseling about heart failure. Directed toward relieving symptoms such as shortness of breath, decreased exercise tolerance, and extremity edema.    Expected Outcomes Improve functional capacity of life;Short term: Attendance in program 2-3 days a week with increased exercise capacity. Reported lower sodium intake. Reported increased fruit and vegetable intake. Reports medication compliance.;Short term: Daily weights obtained and reported for increase. Utilizing diuretic protocols set by physician.;Long term: Adoption of self-care skills and reduction of barriers for early signs and symptoms recognition and intervention leading to self-care maintenance.    Hypertension Yes    Intervention Provide education on lifestyle modifcations including regular physical activity/exercise, weight management, moderate sodium restriction and increased consumption of fresh fruit, vegetables, and low fat dairy, alcohol moderation, and smoking cessation.;Monitor prescription use compliance.    Expected Outcomes Short Term: Continued assessment and intervention until BP is < 140/19mm HG in hypertensive participants. < 130/57mm HG in hypertensive participants with diabetes, heart failure or chronic kidney disease.;Long Term: Maintenance of blood pressure at goal levels.           Education:Diabetes - Individual verbal and written instruction to review signs/symptoms of diabetes, desired ranges of glucose level fasting, after meals and with exercise. Acknowledge that pre and post exercise glucose checks will be done for 3 sessions at entry of program.   Education: Know Your Numbers and Risk Factors: -Group verbal and written instruction about important numbers in your health.  Discussion of what are risk factors and how they play a role in the disease process.  Review of Cholesterol, Blood Pressure, Diabetes,  and BMI and the role they play in your overall health.  Core Components/Risk Factors/Patient Goals Review:    Core Components/Risk Factors/Patient Goals at Discharge (Final Review):    ITP Comments:  ITP Comments    Row Name 11/18/19 0850 11/20/19 1003         ITP Comments Virtual Visit completed. Patient informed on EP and RD appointment and 6 Minute walk test. Patient also informed of patient health questionnaires on My Chart. Patient Verbalizes understanding. Visit diagnosis can be found in [facility] 11/03/2019. Completed 6MWT and gym orientation. Initial ITP created and sent for review to Dr. Emily Filbert, Medical Director.             Comments: Initial ITP

## 2019-11-20 NOTE — Progress Notes (Signed)
   11/20/19 1009  6 Minute Walk  Phase Initial  Distance 1045 feet  Walk Time 5.2 minutes (Stopped at 5:15 due to O2 desaturation)  # of Rest Breaks 0  MPH 2.28  METS 3.43  RPE 11  Perceived Dyspnea  0  VO2 Peak 12.03  Symptoms No  Resting HR 84 bpm  Resting BP 118/74  Resting Oxygen Saturation  98 %  Exercise Oxygen Saturation  during 6 min walk 79 % (Stopped 6MWT at 5:15 minutes)  Max Ex. HR 93 bpm  Max Ex. BP 140/76  2 Minute Post BP 120/74  Interval HR  Interval Heart Rate? Yes  1 Minute HR 88  2 Minute HR 91  3 Minute HR 91  4 Minute HR 89  5 Minute HR 91  6 Minute HR 93  2 Minute Post HR 87  Interval Oxygen  Interval Oxygen? Yes  Baseline Oxygen Saturation % 98 %  1 Minute Oxygen Saturation % 94 %  1 Minute Liters of Oxygen 0 L  2 Minute Oxygen Saturation % 88 %  2 Minute Liters of Oxygen 0 L  3 Minute Oxygen Saturation % 81 %  3 Minute Liters of Oxygen 0 L  4 Minute Oxygen Saturation % 88 %  4 Minute Liters of Oxygen 0 L  5 Minute Oxygen Saturation % 83 %  5 Minute Liters of Oxygen 0 L  6 Minute Oxygen Saturation % 79 %  6 Minute Liters of Oxygen 0 L  2 Minute Post Oxygen Saturation % 94 %  2 Minute Post Liters of Oxygen 0 L   Hi Dr. Marijean Bravo- I am Coralie Keens, Exercise Physiologist. I completed Tony's 6 minute walk today for Pulmonary Rehab and had to stop him due to his O2 desaturation dropped below 80%. I wanted to let you know that we will monitor him during exercise and if his O2 levels continue to drop below 88%, he made need an prescription for oxygen during exercise. We will keep you updated as soon as Nicole Kindred starts the program. Thank you for the time and referral.

## 2019-11-21 DIAGNOSIS — Z992 Dependence on renal dialysis: Secondary | ICD-10-CM | POA: Diagnosis not present

## 2019-11-21 DIAGNOSIS — Z23 Encounter for immunization: Secondary | ICD-10-CM | POA: Diagnosis not present

## 2019-11-21 DIAGNOSIS — K2961 Other gastritis with bleeding: Secondary | ICD-10-CM | POA: Diagnosis not present

## 2019-11-21 DIAGNOSIS — N186 End stage renal disease: Secondary | ICD-10-CM | POA: Diagnosis not present

## 2019-11-24 DIAGNOSIS — K2961 Other gastritis with bleeding: Secondary | ICD-10-CM | POA: Diagnosis not present

## 2019-11-24 DIAGNOSIS — N186 End stage renal disease: Secondary | ICD-10-CM | POA: Diagnosis not present

## 2019-11-24 DIAGNOSIS — Z992 Dependence on renal dialysis: Secondary | ICD-10-CM | POA: Diagnosis not present

## 2019-11-24 DIAGNOSIS — Z23 Encounter for immunization: Secondary | ICD-10-CM | POA: Diagnosis not present

## 2019-11-25 ENCOUNTER — Ambulatory Visit: Payer: Medicare Other

## 2019-11-25 DIAGNOSIS — Z23 Encounter for immunization: Secondary | ICD-10-CM | POA: Diagnosis not present

## 2019-11-25 DIAGNOSIS — Z992 Dependence on renal dialysis: Secondary | ICD-10-CM | POA: Diagnosis not present

## 2019-11-25 DIAGNOSIS — N186 End stage renal disease: Secondary | ICD-10-CM | POA: Diagnosis not present

## 2019-11-25 DIAGNOSIS — K2961 Other gastritis with bleeding: Secondary | ICD-10-CM | POA: Diagnosis not present

## 2019-11-26 DIAGNOSIS — K2961 Other gastritis with bleeding: Secondary | ICD-10-CM | POA: Diagnosis not present

## 2019-11-26 DIAGNOSIS — Z992 Dependence on renal dialysis: Secondary | ICD-10-CM | POA: Diagnosis not present

## 2019-11-26 DIAGNOSIS — Z23 Encounter for immunization: Secondary | ICD-10-CM | POA: Diagnosis not present

## 2019-11-26 DIAGNOSIS — N186 End stage renal disease: Secondary | ICD-10-CM | POA: Diagnosis not present

## 2019-11-27 ENCOUNTER — Ambulatory Visit: Payer: Medicare Other

## 2019-11-28 DIAGNOSIS — Z992 Dependence on renal dialysis: Secondary | ICD-10-CM | POA: Diagnosis not present

## 2019-11-28 DIAGNOSIS — N186 End stage renal disease: Secondary | ICD-10-CM | POA: Diagnosis not present

## 2019-11-28 DIAGNOSIS — Z23 Encounter for immunization: Secondary | ICD-10-CM | POA: Diagnosis not present

## 2019-11-28 DIAGNOSIS — K2961 Other gastritis with bleeding: Secondary | ICD-10-CM | POA: Diagnosis not present

## 2019-12-01 DIAGNOSIS — Z23 Encounter for immunization: Secondary | ICD-10-CM | POA: Diagnosis not present

## 2019-12-01 DIAGNOSIS — K2961 Other gastritis with bleeding: Secondary | ICD-10-CM | POA: Diagnosis not present

## 2019-12-01 DIAGNOSIS — N186 End stage renal disease: Secondary | ICD-10-CM | POA: Diagnosis not present

## 2019-12-01 DIAGNOSIS — Z992 Dependence on renal dialysis: Secondary | ICD-10-CM | POA: Diagnosis not present

## 2019-12-02 ENCOUNTER — Telehealth: Payer: Self-pay | Admitting: *Deleted

## 2019-12-02 ENCOUNTER — Encounter: Payer: Self-pay | Admitting: *Deleted

## 2019-12-02 ENCOUNTER — Ambulatory Visit: Payer: Medicare Other

## 2019-12-02 DIAGNOSIS — I2721 Secondary pulmonary arterial hypertension: Secondary | ICD-10-CM

## 2019-12-02 DIAGNOSIS — N186 End stage renal disease: Secondary | ICD-10-CM | POA: Diagnosis not present

## 2019-12-02 DIAGNOSIS — Z992 Dependence on renal dialysis: Secondary | ICD-10-CM | POA: Diagnosis not present

## 2019-12-02 DIAGNOSIS — Z23 Encounter for immunization: Secondary | ICD-10-CM | POA: Diagnosis not present

## 2019-12-02 DIAGNOSIS — K2961 Other gastritis with bleeding: Secondary | ICD-10-CM | POA: Diagnosis not present

## 2019-12-02 NOTE — Telephone Encounter (Signed)
Called to check on patient.  He has adjusted his medication and is now feeling better. He hopes to return on Thursday.

## 2019-12-03 DIAGNOSIS — Z23 Encounter for immunization: Secondary | ICD-10-CM | POA: Diagnosis not present

## 2019-12-03 DIAGNOSIS — N186 End stage renal disease: Secondary | ICD-10-CM | POA: Diagnosis not present

## 2019-12-03 DIAGNOSIS — K2961 Other gastritis with bleeding: Secondary | ICD-10-CM | POA: Diagnosis not present

## 2019-12-03 DIAGNOSIS — Z992 Dependence on renal dialysis: Secondary | ICD-10-CM | POA: Diagnosis not present

## 2019-12-04 ENCOUNTER — Ambulatory Visit: Payer: Medicare Other

## 2019-12-04 ENCOUNTER — Other Ambulatory Visit: Payer: Self-pay | Admitting: Family Medicine

## 2019-12-04 DIAGNOSIS — M109 Gout, unspecified: Secondary | ICD-10-CM

## 2019-12-04 MED ORDER — PREDNISONE 10 MG PO TABS: 10.0000 mg | ORAL_TABLET | Freq: Two times a day (BID) | ORAL | 0 refills | Status: DC | PRN

## 2019-12-04 NOTE — Telephone Encounter (Signed)
Requested medication (s) are due for refill today - no  Requested medication (s) are on the active medication list -yes  Future visit scheduled -no  Last refill: 07/28/19  Notes to clinic: Request for non delegated Rx  Requested Prescriptions  Pending Prescriptions Disp Refills   predniSONE (DELTASONE) 10 MG tablet 10 tablet 0    Sig: Take 1 tablet (10 mg total) by mouth 2 (two) times daily as needed. Prn gout attack for 2 days      Not Delegated - Endocrinology:  Oral Corticosteroids Failed - 12/04/2019  1:58 PM      Failed - This refill cannot be delegated      Failed - Last BP in normal range    BP Readings from Last 1 Encounters:  08/08/19 (!) 160/80          Passed - Valid encounter within last 6 months    Recent Outpatient Visits           3 months ago ESRD on dialysis [facility])   [facility] Steele Sizer, MD   7 months ago ESRD on dialysis [facility])   [facility] Steele Sizer, MD   10 months ago Chronic systolic heart failure [facility])   [facility] Steele Sizer, MD   1 year ago Exposure to [facility], Drue Stager, MD   1 year ago Uncontrolled hypertension   [facility] Steele Sizer, MD                  Requested Prescriptions  Pending Prescriptions Disp Refills   predniSONE (DELTASONE) 10 MG tablet 10 tablet 0    Sig: Take 1 tablet (10 mg total) by mouth 2 (two) times daily as needed. Prn gout attack for 2 days      Not Delegated - Endocrinology:  Oral Corticosteroids Failed - 12/04/2019  1:58 PM      Failed - This refill cannot be delegated      Failed - Last BP in normal range    BP Readings from Last 1 Encounters:  08/08/19 (!) 160/80          Passed - Valid encounter within last 6 months    Recent Outpatient Visits           3 months ago ESRD on dialysis [facility])   [facility] Steele Sizer, MD   7  months ago ESRD on dialysis [facility])   [facility] Steele Sizer, MD   10 months ago Chronic systolic heart failure [facility])   [facility] Steele Sizer, MD   1 year ago Exposure to [facility] Steele Sizer, MD   1 year ago Uncontrolled hypertension   [facility] Steele Sizer, MD

## 2019-12-04 NOTE — Telephone Encounter (Signed)
Medication Refill - Medication: predniSONE (DELTASONE) 10 MG tablet   Has the patient contacted their pharmacy? Yes.   (Agent: If no, request that the patient contact the pharmacy for the refill.) (Agent: If yes, when and what did the pharmacy advise?)  Preferred Pharmacy (with phone number or street name):  [id number] - [place], [address]  [address] [address]  Phone: [phone] Fax: [phone]     Agent: Please be advised that RX refills may take up to 3 business days. We ask that you follow-up with your pharmacy.

## 2019-12-05 DIAGNOSIS — N186 End stage renal disease: Secondary | ICD-10-CM | POA: Diagnosis not present

## 2019-12-05 DIAGNOSIS — Z992 Dependence on renal dialysis: Secondary | ICD-10-CM | POA: Diagnosis not present

## 2019-12-05 DIAGNOSIS — K2961 Other gastritis with bleeding: Secondary | ICD-10-CM | POA: Diagnosis not present

## 2019-12-05 DIAGNOSIS — Z23 Encounter for immunization: Secondary | ICD-10-CM | POA: Diagnosis not present

## 2019-12-08 DIAGNOSIS — Z992 Dependence on renal dialysis: Secondary | ICD-10-CM | POA: Diagnosis not present

## 2019-12-08 DIAGNOSIS — Z23 Encounter for immunization: Secondary | ICD-10-CM | POA: Diagnosis not present

## 2019-12-08 DIAGNOSIS — N186 End stage renal disease: Secondary | ICD-10-CM | POA: Diagnosis not present

## 2019-12-08 DIAGNOSIS — K2961 Other gastritis with bleeding: Secondary | ICD-10-CM | POA: Diagnosis not present

## 2019-12-09 ENCOUNTER — Ambulatory Visit: Payer: Medicare Other

## 2019-12-09 ENCOUNTER — Telehealth: Payer: Self-pay

## 2019-12-09 DIAGNOSIS — N186 End stage renal disease: Secondary | ICD-10-CM | POA: Diagnosis not present

## 2019-12-09 DIAGNOSIS — K2961 Other gastritis with bleeding: Secondary | ICD-10-CM | POA: Diagnosis not present

## 2019-12-09 DIAGNOSIS — Z992 Dependence on renal dialysis: Secondary | ICD-10-CM | POA: Diagnosis not present

## 2019-12-09 DIAGNOSIS — Z23 Encounter for immunization: Secondary | ICD-10-CM | POA: Diagnosis not present

## 2019-12-09 NOTE — Telephone Encounter (Signed)
Spoke with patient, he is having side effects from his medication and is waiting to change over so he feels better to come back to pulmonary rehab. Asked patient to call us as soon as he is ready to start. Patient understands.

## 2019-12-10 DIAGNOSIS — K2961 Other gastritis with bleeding: Secondary | ICD-10-CM | POA: Diagnosis not present

## 2019-12-10 DIAGNOSIS — Z23 Encounter for immunization: Secondary | ICD-10-CM | POA: Diagnosis not present

## 2019-12-10 DIAGNOSIS — Z992 Dependence on renal dialysis: Secondary | ICD-10-CM | POA: Diagnosis not present

## 2019-12-10 DIAGNOSIS — N186 End stage renal disease: Secondary | ICD-10-CM | POA: Diagnosis not present

## 2019-12-11 ENCOUNTER — Ambulatory Visit: Payer: Medicare Other

## 2019-12-12 ENCOUNTER — Other Ambulatory Visit: Payer: Self-pay

## 2019-12-12 DIAGNOSIS — K2961 Other gastritis with bleeding: Secondary | ICD-10-CM | POA: Diagnosis not present

## 2019-12-12 DIAGNOSIS — Z992 Dependence on renal dialysis: Secondary | ICD-10-CM | POA: Diagnosis not present

## 2019-12-12 DIAGNOSIS — N186 End stage renal disease: Secondary | ICD-10-CM | POA: Diagnosis not present

## 2019-12-12 DIAGNOSIS — Z23 Encounter for immunization: Secondary | ICD-10-CM | POA: Diagnosis not present

## 2019-12-12 DIAGNOSIS — I10 Essential (primary) hypertension: Secondary | ICD-10-CM

## 2019-12-12 MED ORDER — CLONIDINE 0.3 MG/24HR TD PTWK: 0.3000 mg | MEDICATED_PATCH | TRANSDERMAL | 0 refills | Status: DC

## 2019-12-14 ENCOUNTER — Encounter: Admit: 2019-12-14 | Discharge: 2019-12-15 | Payer: MEDICARE

## 2019-12-14 DIAGNOSIS — Z992 Dependence on renal dialysis: Secondary | ICD-10-CM | POA: Diagnosis not present

## 2019-12-14 DIAGNOSIS — N186 End stage renal disease: Secondary | ICD-10-CM | POA: Diagnosis not present

## 2019-12-15 ENCOUNTER — Institutional Professional Consult (permissible substitution): Admit: 2019-12-15 | Discharge: 2019-12-16 | Payer: MEDICARE

## 2019-12-15 ENCOUNTER — Telehealth: Payer: Self-pay

## 2019-12-15 DIAGNOSIS — N186 End stage renal disease: Secondary | ICD-10-CM | POA: Diagnosis not present

## 2019-12-15 DIAGNOSIS — K2961 Other gastritis with bleeding: Secondary | ICD-10-CM | POA: Diagnosis not present

## 2019-12-15 DIAGNOSIS — Z992 Dependence on renal dialysis: Secondary | ICD-10-CM | POA: Diagnosis not present

## 2019-12-15 NOTE — Telephone Encounter (Signed)
Pt was called 10/26 - did not call again

## 2019-12-16 ENCOUNTER — Ambulatory Visit: Payer: Medicare Other

## 2019-12-17 ENCOUNTER — Encounter: Payer: Self-pay | Admitting: *Deleted

## 2019-12-17 DIAGNOSIS — I2721 Secondary pulmonary arterial hypertension: Secondary | ICD-10-CM

## 2019-12-17 DIAGNOSIS — N186 End stage renal disease: Secondary | ICD-10-CM | POA: Diagnosis not present

## 2019-12-17 DIAGNOSIS — K2961 Other gastritis with bleeding: Secondary | ICD-10-CM | POA: Diagnosis not present

## 2019-12-17 DIAGNOSIS — Z992 Dependence on renal dialysis: Secondary | ICD-10-CM | POA: Diagnosis not present

## 2019-12-17 NOTE — Progress Notes (Signed)
Pulmonary Individual Treatment Plan  Patient Details  Name: Louis [name] MRN: [id number] Date of Birth: [birth year] Referring Provider:     Pulmonary Rehab from 11/20/2019 in [facility] Cardiac and Pulmonary Rehab  Referring Provider Romona Curls MD      Initial Encounter Date:    Pulmonary Rehab from 11/20/2019 in [facility] Cardiac and Pulmonary Rehab  Date 11/20/19      Visit Diagnosis: PAH (pulmonary artery hypertension) ([place])  Patient's Home Medications on Admission:  Current Outpatient Medications:  .  amLODipine (NORVASC) 10 MG tablet, Take 1 tablet (10 mg total) by mouth daily. (Patient not taking: Reported on 11/18/2019), Disp: 30 tablet, Rfl: 0 .  AURYXIA 1 GM 210 MG(Fe) tablet, Take 1 tablet by mouth 4 (four) times daily -  before meals and at bedtime., Disp: , Rfl:  .  B Complex-C-Folic Acid (RENA-VITE RX) 1 MG TABS, Take 1 tablet by mouth daily., Disp: , Rfl:  .  budesonide-formoterol (SYMBICORT) 80-4.5 MCG/ACT inhaler, Inhale 2 puffs into the lungs 2 (two) times daily. (Patient not taking: Reported on 11/18/2019), Disp: 1 Inhaler, Rfl: 12 .  carvedilol (COREG) 25 MG tablet, Take 1 tablet (25 mg total) by mouth 2 (two) times daily., Disp: 180 tablet, Rfl: 3 .  carvedilol (COREG) 6.25 MG tablet, Take 6.25 mg by mouth. (Patient not taking: Reported on 11/18/2019), Disp: , Rfl:  .  cetirizine (ZYRTEC) 10 MG tablet, Take 1 tablet (10 mg total) by mouth daily. (Patient not taking: Reported on 10/02/2019), Disp: 30 tablet, Rfl: 11 .  cloNIDine (CATAPRES - DOSED IN MG/24 HR) 0.3 mg/24hr patch, Place 1 patch (0.3 mg total) onto the skin once a week. Please schedule office visit for further refills. Thank you!, Disp: 2 patch, Rfl: 0 .  DESCOVY 200-25 MG tablet, Take 1 tablet by mouth daily., Disp: , Rfl:  .  diclofenac sodium (VOLTAREN) 1 % GEL, Apply 2 g topically 4 (four) times daily. (Patient not taking: Reported on 10/02/2019), Disp: 100 g, Rfl: 0 .  Febuxostat 80 MG TABS, Take 1 tablet by  mouth daily. (Patient not taking: Reported on 10/02/2019), Disp: , Rfl:  .  ferrous sulfate 325 (65 FE) MG tablet, Take 1 tablet by mouth daily. (Patient not taking: Reported on 11/18/2019), Disp: , Rfl:  .  fluticasone (FLONASE) 50 MCG/ACT nasal spray, Place into the nose. (Patient not taking: Reported on 10/02/2019), Disp: , Rfl:  .  furosemide (LASIX) 40 MG tablet, Take 80 mg by mouth daily.  (Patient not taking: Reported on 10/02/2019), Disp: , Rfl:  .  hydrALAZINE (APRESOLINE) 100 MG tablet, Take 150 mg by mouth 3 (three) times daily. (Patient not taking: Reported on 11/18/2019), Disp: , Rfl:  .  methocarbamol (ROBAXIN) 500 MG tablet, TAKE 1 TABLET (500 MG TOTAL) BY MOUTH 2 (TWO) TIMES DAILY AS NEEDED. FOR PAIN (Patient not taking: Reported on 11/18/2019), Disp: 60 tablet, Rfl: 0 .  ondansetron (ZOFRAN-ODT) 4 MG disintegrating tablet, Take 4 mg by mouth every 8 (eight) hours as needed. (Patient not taking: Reported on 10/02/2019), Disp: , Rfl:  .  predniSONE (DELTASONE) 10 MG tablet, Take 1 tablet (10 mg total) by mouth 2 (two) times daily as needed. Prn gout attack for 2 days, Disp: 10 tablet, Rfl: 0 .  sevelamer carbonate (RENVELA) 800 MG tablet, Take 800 mg by mouth 3 (three) times daily. (Patient not taking: Reported on 10/02/2019), Disp: , Rfl:  .  Tiotropium Bromide Monohydrate (SPIRIVA RESPIMAT) 1.25 MCG/ACT AERS, INHALE 2 PUFFS BY  MOUTH INTO THE LUNGS DAILY, Disp: 4 g, Rfl: 3 .  TIVICAY 50 MG tablet, Take 1 tablet by mouth daily., Disp: , Rfl:  .  valACYclovir (VALTREX) 500 MG tablet, Take 500 mg by mouth daily as needed (for flare ups). , Disp: , Rfl:   Past Medical History: Past Medical History:  Diagnosis Date  . Chronic kidney disease    esrd  . Dermatophytosis of foot 06/23/2004  . Environmental allergies   . ESRD (end stage renal disease) on dialysis ([place])   . Gout   . HIV infection ([place]) 12/1995  . Hypertension   . Seborrhea 06/23/2004    Tobacco Use: Social History   Tobacco  Use  Smoking Status Never Smoker  Smokeless Tobacco Never Used    Labs: Recent Review Scientist, physiological    Labs for ITP Cardiac and Pulmonary Rehab Latest Ref Rng & Units 06/09/2010 08/09/2011 08/23/2016   Cholestrol 0 - 200 mg/dL 186 179 -   LDLCALC 0 - 99 mg/dL 117(H) 107(H) -   HDL >39 mg/dL 55 54 -   Trlycerides <150 mg/dL 69 89 -   Hemoglobin A1c 4.8 - 5.6 % - - 5.1       Pulmonary Assessment Scores:  Pulmonary Assessment Scores    Row Name 11/20/19 1008         ADL UCSD   ADL Phase Entry     SOB Score total 5     Rest 0     Walk 0     Stairs 0     Bath 0     Dress 0     Shop 0       CAT Score   CAT Score 8       mMRC Score   mMRC Score 1            UCSD: Self-administered rating of dyspnea associated with activities of daily living (ADLs) 6-point scale (0 = "not at all" to 5 = "maximal or unable to do because of breathlessness")  Scoring Scores range from 0 to 120.  Minimally important difference is 5 units  CAT: CAT can identify the health impairment of COPD patients and is better correlated with disease progression.  CAT has a scoring range of zero to 40. The CAT score is classified into four groups of low (less than 10), medium (10 - 20), high (21-30) and very high (31-40) based on the impact level of disease on health status. A CAT score over 10 suggests significant symptoms.  A worsening CAT score could be explained by an exacerbation, poor medication adherence, poor inhaler technique, or progression of COPD or comorbid conditions.  CAT MCID is 2 points  mMRC: mMRC (Modified Medical Research Council) Dyspnea Scale is used to assess the degree of baseline functional disability in patients of respiratory disease due to dyspnea. No minimal important difference is established. A decrease in score of 1 point or greater is considered a positive change.   Pulmonary Function Assessment:  Pulmonary Function Assessment - 11/18/19 0838      Breath   Shortness of  Breath Limiting activity;Yes           Exercise Target Goals: Exercise Program Goal: Individual exercise prescription set using results from initial 6 min walk test and THRR while considering  patient's activity barriers and safety.   Exercise Prescription Goal: Initial exercise prescription builds to 30-45 minutes a day of aerobic activity, 2-3 days per week.  Home exercise guidelines will  be given to patient during program as part of exercise prescription that the participant will acknowledge.  Education: Aerobic Exercise & Resistance Training: - Gives group verbal and written instruction on the various components of exercise. Focuses on aerobic and resistive training programs and the benefits of this training and how to safely progress through these programs..   Education: Exercise & Equipment Safety: - Individual verbal instruction and demonstration of equipment use and safety with use of the equipment.   Pulmonary Rehab from 11/18/2019 in [facility] Cardiac and Pulmonary Rehab  Date 11/18/19  Educator [facility]  Instruction Review Code 1- Verbalizes Understanding      Education: Exercise Physiology & General Exercise Guidelines: - Group verbal and written instruction with models to review the exercise physiology of the cardiovascular system and associated critical values. Provides general exercise guidelines with specific guidelines to those with heart or lung disease.    Education: Flexibility, Balance, Mind/Body Relaxation: Provides group verbal/written instruction on the benefits of flexibility and balance training, including mind/body exercise modes such as yoga, pilates and tai chi.  Demonstration and skill practice provided.   Activity Barriers & Risk Stratification:  Activity Barriers & Cardiac Risk Stratification - 11/20/19 1005      Activity Barriers & Cardiac Risk Stratification   Activity Barriers Shortness of Breath           6 Minute Walk:  6 Minute Walk    Row Name  11/20/19 1009         6 Minute Walk   Phase Initial     Distance 1045 feet     Walk Time 5.2 minutes  Stopped at 5:15 due to O2 desaturation     # of Rest Breaks 0     MPH 2.28     METS 3.43     RPE 11     Perceived Dyspnea  0     VO2 Peak 12.03     Symptoms No     Resting HR 84 bpm     Resting BP 118/74     Resting Oxygen Saturation  98 %     Exercise Oxygen Saturation  during 6 min walk 79 %  Stopped 6MWT at 5:15 minutes     Max Ex. HR 93 bpm     Max Ex. BP 140/76     2 Minute Post BP 120/74       Interval HR   1 Minute HR 88     2 Minute HR 91     3 Minute HR 91     4 Minute HR 89     5 Minute HR 91     6 Minute HR 93     2 Minute Post HR 87     Interval Heart Rate? Yes       Interval Oxygen   Interval Oxygen? Yes     Baseline Oxygen Saturation % 98 %     1 Minute Oxygen Saturation % 94 %     1 Minute Liters of Oxygen 0 L     2 Minute Oxygen Saturation % 88 %     2 Minute Liters of Oxygen 0 L     3 Minute Oxygen Saturation % 81 %     3 Minute Liters of Oxygen 0 L     4 Minute Oxygen Saturation % 88 %     4 Minute Liters of Oxygen 0 L     5 Minute Oxygen Saturation % 83 %  5 Minute Liters of Oxygen 0 L     6 Minute Oxygen Saturation % 79 %     6 Minute Liters of Oxygen 0 L     2 Minute Post Oxygen Saturation % 94 %     2 Minute Post Liters of Oxygen 0 L           Oxygen Initial Assessment:  Oxygen Initial Assessment - 11/20/19 1007      Home Oxygen   Home Oxygen Device None    Sleep Oxygen Prescription None    Home Exercise Oxygen Prescription None    Home Resting Oxygen Prescription None      Initial 6 min Walk   Oxygen Used None      Program Oxygen Prescription   Program Oxygen Prescription None      Intervention   Short Term Goals To learn and understand importance of monitoring SPO2 with pulse oximeter and demonstrate accurate use of the pulse oximeter.;To learn and understand importance of maintaining oxygen saturations>88%;To learn and  demonstrate proper pursed lip breathing techniques or other breathing techniques.;To learn and demonstrate proper use of respiratory medications    Long  Term Goals Verbalizes importance of monitoring SPO2 with pulse oximeter and return demonstration;Maintenance of O2 saturations>88%;Exhibits proper breathing techniques, such as pursed lip breathing or other method taught during program session;Compliance with respiratory medication;Demonstrates proper use of MDI's           Oxygen Re-Evaluation:   Oxygen Discharge (Final Oxygen Re-Evaluation):   Initial Exercise Prescription:  Initial Exercise Prescription - 11/20/19 1000      Date of Initial Exercise RX and Referring Provider   Date 11/20/19    Referring Provider Romona Curls MD      Treadmill   MPH 1.8    Grade 0    Minutes 15    METs 2.3      NuStep   Level 1    SPM 80    Minutes 15    METs 3.4      REL-XR   Level 1    Speed 50    Minutes 15    METs 3.4      Biostep-RELP   Level 1    SPM 50    Minutes 15    METs 3.4      Prescription Details   Frequency (times per week) 2    Duration Progress to 30 minutes of continuous aerobic without signs/symptoms of physical distress      Intensity   THRR 40-80% of Max Heartrate 115-147    Ratings of Perceived Exertion 11-13    Perceived Dyspnea 0-4      Progression   Progression Continue to progress workloads to maintain intensity without signs/symptoms of physical distress.      Resistance Training   Training Prescription Yes    Weight 4 lb    Reps 10-15           Perform Capillary Blood Glucose checks as needed.  Exercise Prescription Changes:  Exercise Prescription Changes    Row Name 11/20/19 1000             Response to Exercise   Blood Pressure (Admit) 118/74       Blood Pressure (Exercise) 140/76       Blood Pressure (Exit) 124/82       Heart Rate (Admit) 84 bpm       Heart Rate (Exercise) 93 bpm       Heart Rate (  Exit) 80 bpm        Oxygen Saturation (Admit) 98 %       Oxygen Saturation (Exercise) 79 %  Stopped 6MWT due to O2 desaturation       Oxygen Saturation (Exit) 98 %       Rating of Perceived Exertion (Exercise) 11       Perceived Dyspnea (Exercise) 0       Symptoms none       Comments walk test results       Duration Progress to 30 minutes of  aerobic without signs/symptoms of physical distress              Exercise Comments:   Exercise Goals and Review:  Exercise Goals    Row Name 11/20/19 1021             Exercise Goals   Increase Physical Activity Yes       Intervention Provide advice, education, support and counseling about physical activity/exercise needs.;Develop an individualized exercise prescription for aerobic and resistive training based on initial evaluation findings, risk stratification, comorbidities and participant's personal goals.       Expected Outcomes Short Term: Attend rehab on a regular basis to increase amount of physical activity.;Long Term: Add in home exercise to make exercise part of routine and to increase amount of physical activity.;Long Term: Exercising regularly at least 3-5 days a week.       Increase Strength and Stamina Yes       Intervention Provide advice, education, support and counseling about physical activity/exercise needs.;Develop an individualized exercise prescription for aerobic and resistive training based on initial evaluation findings, risk stratification, comorbidities and participant's personal goals.       Expected Outcomes Short Term: Increase workloads from initial exercise prescription for resistance, speed, and METs.;Short Term: Perform resistance training exercises routinely during rehab and add in resistance training at home;Long Term: Improve cardiorespiratory fitness, muscular endurance and strength as measured by increased METs and functional capacity (6MWT)       Able to understand and use rate of perceived exertion (RPE) scale Yes        Intervention Provide education and explanation on how to use RPE scale       Expected Outcomes Short Term: Able to use RPE daily in rehab to express subjective intensity level;Long Term:  Able to use RPE to guide intensity level when exercising independently       Able to understand and use Dyspnea scale Yes       Intervention Provide education and explanation on how to use Dyspnea scale       Expected Outcomes Short Term: Able to use Dyspnea scale daily in rehab to express subjective sense of shortness of breath during exertion;Long Term: Able to use Dyspnea scale to guide intensity level when exercising independently       Knowledge and understanding of Target Heart Rate Range (THRR) Yes       Intervention Provide education and explanation of THRR including how the numbers were predicted and where they are located for reference       Expected Outcomes Short Term: Able to state/look up THRR;Short Term: Able to use daily as guideline for intensity in rehab;Long Term: Able to use THRR to govern intensity when exercising independently       Able to check pulse independently Yes       Intervention Provide education and demonstration on how to check pulse in carotid and radial arteries.;Review the importance  of being able to check your own pulse for safety during independent exercise       Expected Outcomes Short Term: Able to explain why pulse checking is important during independent exercise;Long Term: Able to check pulse independently and accurately       Understanding of Exercise Prescription Yes       Intervention Provide education, explanation, and written materials on patient's individual exercise prescription       Expected Outcomes Short Term: Able to explain program exercise prescription;Long Term: Able to explain home exercise prescription to exercise independently              Exercise Goals Re-Evaluation :  Exercise Goals Re-Evaluation    Row Name 12/02/19 1458              Exercise Goal Re-Evaluation   Comments Has not started classes yet.              Discharge Exercise Prescription (Final Exercise Prescription Changes):  Exercise Prescription Changes - 11/20/19 1000      Response to Exercise   Blood Pressure (Admit) 118/74    Blood Pressure (Exercise) 140/76    Blood Pressure (Exit) 124/82    Heart Rate (Admit) 84 bpm    Heart Rate (Exercise) 93 bpm    Heart Rate (Exit) 80 bpm    Oxygen Saturation (Admit) 98 %    Oxygen Saturation (Exercise) 79 %   Stopped 6MWT due to O2 desaturation   Oxygen Saturation (Exit) 98 %    Rating of Perceived Exertion (Exercise) 11    Perceived Dyspnea (Exercise) 0    Symptoms none    Comments walk test results    Duration Progress to 30 minutes of  aerobic without signs/symptoms of physical distress           Nutrition:  Target Goals: Understanding of nutrition guidelines, daily intake of sodium '1500mg'$ , cholesterol '200mg'$ , calories 30% from fat and 7% or less from saturated fats, daily to have 5 or more servings of fruits and vegetables.  Education: Controlling Sodium/Reading Food Labels -Group verbal and written material supporting the discussion of sodium use in heart healthy nutrition. Review and explanation with models, verbal and written materials for utilization of the food label.   Education: General Nutrition Guidelines/Fats and Fiber: -Group instruction provided by verbal, written material, models and posters to present the general guidelines for heart healthy nutrition. Gives an explanation and review of dietary fats and fiber.   Biometrics:  Pre Biometrics - 11/20/19 1003      Pre Biometrics   Height 6' 1.2" (1.859 m)    Weight 189 lb 1.6 oz (85.8 kg)    BMI (Calculated) 24.82    Single Leg Stand 26 seconds            Nutrition Therapy Plan and Nutrition Goals:   Nutrition Assessments:  Nutrition Assessments - 11/20/19 1007      MEDFICTS Scores   Pre Score 42            MEDIFICTS Score Key:          ?70 Need to make dietary changes          40-70 Heart Healthy Diet         ? 40 Therapeutic Level Cholesterol Diet  Nutrition Goals Re-Evaluation:   Nutrition Goals Discharge (Final Nutrition Goals Re-Evaluation):   Psychosocial: Target Goals: Acknowledge presence or absence of significant depression and/or stress, maximize coping skills, provide positive support system. Participant  is able to verbalize types and ability to use techniques and skills needed for reducing stress and depression.   Education: Depression - Provides group verbal and written instruction on the correlation between heart/lung disease and depressed mood, treatment options, and the stigmas associated with seeking treatment.   Education: Sleep Hygiene -Provides group verbal and written instruction about how sleep can affect your health.  Define sleep hygiene, discuss sleep cycles and impact of sleep habits. Review good sleep hygiene tips.    Education: Stress and Anxiety: - Provides group verbal and written instruction about the health risks of elevated stress and causes of high stress.  Discuss the correlation between heart/lung disease and anxiety and treatment options. Review healthy ways to manage with stress and anxiety.   Initial Review & Psychosocial Screening:  Initial Psych Review & Screening - 11/18/19 0838      Initial Review   Current issues with None Identified      Family Dynamics   Good Support System? Yes    Comments Patient reports no issues with their current mental states, sleep, stress, depression or anxiety. Will follow up with patient in a few weeks for any changes.      Barriers   Psychosocial barriers to participate in program There are no identifiable barriers or psychosocial needs.      Screening Interventions   Interventions Encouraged to exercise;To provide support and resources with identified psychosocial needs;Provide feedback about the  scores to participant    Expected Outcomes Short Term goal: Utilizing psychosocial counselor, staff and physician to assist with identification of specific Stressors or current issues interfering with healing process. Setting desired goal for each stressor or current issue identified.;Long Term Goal: Stressors or current issues are controlled or eliminated.;Short Term goal: Identification and review with participant of any Quality of Life or Depression concerns found by scoring the questionnaire.;Long Term goal: The participant improves quality of Life and PHQ9 Scores as seen by post scores and/or verbalization of changes           Quality of Life Scores:  Scores of 19 and below usually indicate a poorer quality of life in these areas.  A difference of  2-3 points is a clinically meaningful difference.  A difference of 2-3 points in the total score of the Quality of Life Index has been associated with significant improvement in overall quality of life, self-image, physical symptoms, and general health in studies assessing change in quality of life.  PHQ-9: Recent Review Flowsheet Data    Depression screen [facility] 2/9 11/20/2019 10/02/2019 08/08/2019 04/28/2019 04/21/2019   Decreased Interest 3  0 0 0 0   Down, Depressed, Hopeless 0 0 0 0 0   PHQ - 2 Score 3 0 0 0 0   Altered sleeping 0 - 0 1 1   Tired, decreased energy 2 - 0 1 2   Change in appetite 0 - 0 0 0   Feeling bad or failure about yourself  0 - 0 0 0   Trouble concentrating 0 - 0 0 0   Moving slowly or fidgety/restless 0 - 0 0 0   Suicidal thoughts 0 - 0 0 0   PHQ-9 Score 5 - 0 2 3   Difficult doing work/chores Not difficult at all - - Not difficult at all Not difficult at all     Interpretation of Total Score  Total Score Depression Severity:  1-4 = Minimal depression, 5-9 = Mild depression, 10-14 = Moderate depression, 15-19 =  Moderately severe depression, 20-27 = Severe depression   Psychosocial Evaluation and Intervention:   Psychosocial Evaluation - 11/18/19 0849      Psychosocial Evaluation & Interventions   Interventions Encouraged to exercise with the program and follow exercise prescription;Relaxation education;Stress management education    Comments Patient reports no issues with their current mental states, sleep, stress, depression or anxiety. Will follow up with patient in a few weeks for any changes.    Expected Outcomes Short: Exercise regularly to support mental health and notify staff of any changes. Long: maintain mental health and well being through teaching of rehab or prescribed medications independently.    Continue Psychosocial Services  Follow up required by staff           Psychosocial Re-Evaluation:   Psychosocial Discharge (Final Psychosocial Re-Evaluation):   Education: Education Goals: Education classes will be provided on a weekly basis, covering required topics. Participant will state understanding/return demonstration of topics presented.  Learning Barriers/Preferences:  Learning Barriers/Preferences - 11/18/19 0837      Learning Barriers/Preferences   Learning Barriers None    Learning Preferences None           General Pulmonary Education Topics:  Infection Prevention: - Provides verbal and written material to individual with discussion of infection control including proper hand washing and proper equipment cleaning during exercise session.   Pulmonary Rehab from 11/18/2019 in [facility] Cardiac and Pulmonary Rehab  Date 11/18/19  Educator [facility]  Instruction Review Code 1- Verbalizes Understanding      Falls Prevention: - Provides verbal and written material to individual with discussion of falls prevention and safety.   Pulmonary Rehab from 11/18/2019 in [facility] Cardiac and Pulmonary Rehab  Date 11/18/19  Educator [facility]  Instruction Review Code 1- Verbalizes Understanding      Chronic Lung Diseases: - Group verbal and written instruction to review updates, respiratory  medications, advancements in procedures and treatments. Discuss use of supplemental oxygen including available portable oxygen systems, continuous and intermittent flow rates, concentrators, personal use and safety guidelines. Review proper use of inhaler and spacers. Provide informative websites for self-education.    Energy Conservation: - Provide group verbal and written instruction for methods to conserve energy, plan and organize activities. Instruct on pacing techniques, use of adaptive equipment and posture/positioning to relieve shortness of breath.   Triggers and Exacerbations: - Group verbal and written instruction to review types of environmental triggers and ways to prevent exacerbations. Discuss weather changes, air quality and the benefits of nasal washing. Review warning signs and symptoms to help prevent infections. Discuss techniques for effective airway clearance, coughing, and vibrations.   AED/CPR: - Group verbal and written instruction with the use of models to demonstrate the basic use of the AED with the basic ABC's of resuscitation.   Anatomy and Physiology of the Lungs: - Group verbal and written instruction with the use of models to provide basic lung anatomy and physiology related to function, structure and complications of lung disease.   Anatomy & Physiology of the Heart: - Group verbal and written instruction and models provide basic cardiac anatomy and physiology, with the coronary electrical and arterial systems. Review of Valvular disease and Heart Failure   Cardiac Medications: - Group verbal and written instruction to review commonly prescribed medications for heart disease. Reviews the medication, class of the drug, and side effects.   Other: -Provides group and verbal instruction on various topics (see comments)   Knowledge Questionnaire Score:  Knowledge Questionnaire Score - 11/20/19  1006      Knowledge Questionnaire Score   Pre Score 17/18:  Oxygen            Core Components/Risk Factors/Patient Goals at Admission:  Personal Goals and Risk Factors at Admission - 11/20/19 1022      Core Components/Risk Factors/Patient Goals on Admission    Weight Management Yes;Weight Maintenance    Intervention Weight Management: Develop a combined nutrition and exercise program designed to reach desired caloric intake, while maintaining appropriate intake of nutrient and fiber, sodium and fats, and appropriate energy expenditure required for the weight goal.;Weight Management: Provide education and appropriate resources to help participant work on and attain dietary goals.;Weight Management/Obesity: Establish reasonable short term and long term weight goals.    Admit Weight 189 lb 1.6 oz (85.8 kg)    Goal Weight: Short Term 189 lb 1.6 oz (85.8 kg)    Goal Weight: Long Term 189 lb 1.6 oz (85.8 kg)    Expected Outcomes Short Term: Continue to assess and modify interventions until short term weight is achieved;Long Term: Adherence to nutrition and physical activity/exercise program aimed toward attainment of established weight goal;Weight Maintenance: Understanding of the daily nutrition guidelines, which includes 25-35% calories from fat, 7% or less cal from saturated fats, less than $RemoveB'200mg'OsAUOiSz$  cholesterol, less than 1.5gm of sodium, & 5 or more servings of fruits and vegetables daily;Understanding recommendations for meals to include 15-35% energy as protein, 25-35% energy from fat, 35-60% energy from carbohydrates, less than $RemoveB'200mg'dqWzMetS$  of dietary cholesterol, 20-35 gm of total fiber daily;Understanding of distribution of calorie intake throughout the day with the consumption of 4-5 meals/snacks    Improve shortness of breath with ADL's Yes    Intervention Provide education, individualized exercise plan and daily activity instruction to help decrease symptoms of SOB with activities of daily living.    Expected Outcomes Short Term: Improve cardiorespiratory  fitness to achieve a reduction of symptoms when performing ADLs;Long Term: Be able to perform more ADLs without symptoms or delay the onset of symptoms    Intervention Provide a combined exercise and nutrition program that is supplemented with education, support and counseling about heart failure. Directed toward relieving symptoms such as shortness of breath, decreased exercise tolerance, and extremity edema.    Expected Outcomes Improve functional capacity of life;Short term: Attendance in program 2-3 days a week with increased exercise capacity. Reported lower sodium intake. Reported increased fruit and vegetable intake. Reports medication compliance.;Short term: Daily weights obtained and reported for increase. Utilizing diuretic protocols set by physician.;Long term: Adoption of self-care skills and reduction of barriers for early signs and symptoms recognition and intervention leading to self-care maintenance.    Hypertension Yes    Intervention Provide education on lifestyle modifcations including regular physical activity/exercise, weight management, moderate sodium restriction and increased consumption of fresh fruit, vegetables, and low fat dairy, alcohol moderation, and smoking cessation.;Monitor prescription use compliance.    Expected Outcomes Short Term: Continued assessment and intervention until BP is < 140/55mm HG in hypertensive participants. < 130/31mm HG in hypertensive participants with diabetes, heart failure or chronic kidney disease.;Long Term: Maintenance of blood pressure at goal levels.           Education:Diabetes - Individual verbal and written instruction to review signs/symptoms of diabetes, desired ranges of glucose level fasting, after meals and with exercise. Acknowledge that pre and post exercise glucose checks will be done for 3 sessions at entry of program.   Education: Know Your Numbers and Risk Factors: -Group  verbal and written instruction about important  numbers in your health.  Discussion of what are risk factors and how they play a role in the disease process.  Review of Cholesterol, Blood Pressure, Diabetes, and BMI and the role they play in your overall health.   Core Components/Risk Factors/Patient Goals Review:    Core Components/Risk Factors/Patient Goals at Discharge (Final Review):    ITP Comments:  ITP Comments    Row Name 11/18/19 0850 11/20/19 1003 12/02/19 1458 12/09/19 1510 12/17/19 0720   ITP Comments Virtual Visit completed. Patient informed on EP and RD appointment and 6 Minute walk test. Patient also informed of patient health questionnaires on My Chart. Patient Verbalizes understanding. Visit diagnosis can be found in [facility] 11/03/2019. Completed 6MWT and gym orientation. Initial ITP created and sent for review to Dr. Emily Filbert, Medical Director. Called to check on patient.  He has adjusted his medication and is now feeling better. He hopes to return on Thursday. Spoke with patient, he is having side effects from his medication and is waiting to change over so he feels better to come back to pulmonary rehab. Asked patient to call us as soon as he is ready to start. Patient understands. 30 Day review completed. Medical Director ITP review done, changes made as directed, and signed approval by Medical Director.          Comments:

## 2019-12-18 ENCOUNTER — Ambulatory Visit: Payer: Medicare Other

## 2019-12-18 ENCOUNTER — Telehealth: Payer: Self-pay

## 2019-12-18 NOTE — Telephone Encounter (Signed)
Nicole Kindred says they are still working on his medications and waiting for them to call you back.  He hopes to get back in soon.

## 2019-12-19 DIAGNOSIS — Z992 Dependence on renal dialysis: Secondary | ICD-10-CM | POA: Diagnosis not present

## 2019-12-19 DIAGNOSIS — K2961 Other gastritis with bleeding: Secondary | ICD-10-CM | POA: Diagnosis not present

## 2019-12-19 DIAGNOSIS — N186 End stage renal disease: Secondary | ICD-10-CM | POA: Diagnosis not present

## 2019-12-22 DIAGNOSIS — Z992 Dependence on renal dialysis: Secondary | ICD-10-CM | POA: Diagnosis not present

## 2019-12-22 DIAGNOSIS — K2961 Other gastritis with bleeding: Secondary | ICD-10-CM | POA: Diagnosis not present

## 2019-12-22 DIAGNOSIS — N186 End stage renal disease: Secondary | ICD-10-CM | POA: Diagnosis not present

## 2019-12-22 MED ORDER — CARVEDILOL 6.25 MG TABLET
ORAL_TABLET | Freq: Two times a day (BID) | ORAL | 2 refills | 60.00000 days
Start: 2019-12-22 — End: 2020-06-19

## 2019-12-23 ENCOUNTER — Ambulatory Visit: Admit: 2019-12-23 | Discharge: 2019-12-24 | Payer: MEDICARE

## 2019-12-23 ENCOUNTER — Ambulatory Visit: Payer: Medicare Other

## 2019-12-23 DIAGNOSIS — Z9189 Other specified personal risk factors, not elsewhere classified: Secondary | ICD-10-CM | POA: Diagnosis not present

## 2019-12-23 DIAGNOSIS — I10 Essential (primary) hypertension: Secondary | ICD-10-CM | POA: Diagnosis not present

## 2019-12-23 DIAGNOSIS — I5022 Chronic systolic (congestive) heart failure: Secondary | ICD-10-CM | POA: Diagnosis not present

## 2019-12-23 MED ORDER — PRAVASTATIN 20 MG TABLET
ORAL_TABLET | Freq: Every day | ORAL | 6 refills | 30 days | Status: CP
Start: 2019-12-23 — End: 2020-01-22

## 2019-12-24 DIAGNOSIS — Z992 Dependence on renal dialysis: Secondary | ICD-10-CM | POA: Diagnosis not present

## 2019-12-24 DIAGNOSIS — K2961 Other gastritis with bleeding: Secondary | ICD-10-CM | POA: Diagnosis not present

## 2019-12-24 DIAGNOSIS — N186 End stage renal disease: Secondary | ICD-10-CM | POA: Diagnosis not present

## 2019-12-25 ENCOUNTER — Ambulatory Visit: Payer: Medicare Other

## 2019-12-26 DIAGNOSIS — N186 End stage renal disease: Secondary | ICD-10-CM | POA: Diagnosis not present

## 2019-12-26 DIAGNOSIS — Z992 Dependence on renal dialysis: Secondary | ICD-10-CM | POA: Diagnosis not present

## 2019-12-26 DIAGNOSIS — K2961 Other gastritis with bleeding: Secondary | ICD-10-CM | POA: Diagnosis not present

## 2019-12-29 DIAGNOSIS — K2961 Other gastritis with bleeding: Secondary | ICD-10-CM | POA: Diagnosis not present

## 2019-12-29 DIAGNOSIS — Z992 Dependence on renal dialysis: Secondary | ICD-10-CM | POA: Diagnosis not present

## 2019-12-29 DIAGNOSIS — N186 End stage renal disease: Secondary | ICD-10-CM | POA: Diagnosis not present

## 2019-12-30 ENCOUNTER — Ambulatory Visit: Payer: Medicare Other

## 2019-12-30 ENCOUNTER — Encounter: Payer: Self-pay | Admitting: *Deleted

## 2019-12-30 DIAGNOSIS — I2721 Secondary pulmonary arterial hypertension: Secondary | ICD-10-CM

## 2019-12-31 ENCOUNTER — Encounter: Admit: 2019-12-31 | Discharge: 2020-01-01 | Payer: MEDICARE

## 2019-12-31 DIAGNOSIS — I371 Nonrheumatic pulmonary valve insufficiency: Secondary | ICD-10-CM | POA: Diagnosis not present

## 2019-12-31 DIAGNOSIS — I5022 Chronic systolic (congestive) heart failure: Secondary | ICD-10-CM | POA: Diagnosis not present

## 2019-12-31 DIAGNOSIS — N186 End stage renal disease: Secondary | ICD-10-CM | POA: Diagnosis not present

## 2019-12-31 DIAGNOSIS — Z992 Dependence on renal dialysis: Secondary | ICD-10-CM | POA: Diagnosis not present

## 2019-12-31 DIAGNOSIS — K2961 Other gastritis with bleeding: Secondary | ICD-10-CM | POA: Diagnosis not present

## 2019-12-31 DIAGNOSIS — I519 Heart disease, unspecified: Secondary | ICD-10-CM | POA: Diagnosis not present

## 2020-01-01 ENCOUNTER — Ambulatory Visit: Payer: Medicare Other

## 2020-01-02 DIAGNOSIS — Z992 Dependence on renal dialysis: Secondary | ICD-10-CM | POA: Diagnosis not present

## 2020-01-02 DIAGNOSIS — N186 End stage renal disease: Secondary | ICD-10-CM | POA: Diagnosis not present

## 2020-01-02 DIAGNOSIS — K2961 Other gastritis with bleeding: Secondary | ICD-10-CM | POA: Diagnosis not present

## 2020-01-05 ENCOUNTER — Other Ambulatory Visit: Payer: Self-pay | Admitting: *Deleted

## 2020-01-05 DIAGNOSIS — N186 End stage renal disease: Secondary | ICD-10-CM | POA: Diagnosis not present

## 2020-01-05 DIAGNOSIS — Z992 Dependence on renal dialysis: Secondary | ICD-10-CM | POA: Diagnosis not present

## 2020-01-05 DIAGNOSIS — K2961 Other gastritis with bleeding: Secondary | ICD-10-CM | POA: Diagnosis not present

## 2020-01-06 ENCOUNTER — Ambulatory Visit: Payer: Medicare Other

## 2020-01-07 DIAGNOSIS — N186 End stage renal disease: Secondary | ICD-10-CM | POA: Diagnosis not present

## 2020-01-07 DIAGNOSIS — K2961 Other gastritis with bleeding: Secondary | ICD-10-CM | POA: Diagnosis not present

## 2020-01-07 DIAGNOSIS — Z992 Dependence on renal dialysis: Secondary | ICD-10-CM | POA: Diagnosis not present

## 2020-01-09 DIAGNOSIS — Z992 Dependence on renal dialysis: Secondary | ICD-10-CM | POA: Diagnosis not present

## 2020-01-09 DIAGNOSIS — N186 End stage renal disease: Secondary | ICD-10-CM | POA: Diagnosis not present

## 2020-01-09 DIAGNOSIS — K2961 Other gastritis with bleeding: Secondary | ICD-10-CM | POA: Diagnosis not present

## 2020-01-12 DIAGNOSIS — K2961 Other gastritis with bleeding: Secondary | ICD-10-CM | POA: Diagnosis not present

## 2020-01-12 DIAGNOSIS — Z992 Dependence on renal dialysis: Secondary | ICD-10-CM | POA: Diagnosis not present

## 2020-01-12 DIAGNOSIS — N186 End stage renal disease: Secondary | ICD-10-CM | POA: Diagnosis not present

## 2020-01-13 ENCOUNTER — Encounter: Admit: 2020-01-13 | Discharge: 2020-01-14 | Payer: MEDICARE

## 2020-01-13 ENCOUNTER — Ambulatory Visit: Payer: Medicare Other

## 2020-01-13 DIAGNOSIS — N186 End stage renal disease: Secondary | ICD-10-CM | POA: Diagnosis not present

## 2020-01-13 DIAGNOSIS — Z992 Dependence on renal dialysis: Secondary | ICD-10-CM | POA: Diagnosis not present

## 2020-01-14 ENCOUNTER — Encounter: Payer: Self-pay | Admitting: *Deleted

## 2020-01-14 DIAGNOSIS — I2721 Secondary pulmonary arterial hypertension: Secondary | ICD-10-CM

## 2020-01-14 DIAGNOSIS — Z992 Dependence on renal dialysis: Secondary | ICD-10-CM | POA: Diagnosis not present

## 2020-01-14 DIAGNOSIS — N186 End stage renal disease: Secondary | ICD-10-CM | POA: Diagnosis not present

## 2020-01-14 DIAGNOSIS — K2961 Other gastritis with bleeding: Secondary | ICD-10-CM | POA: Diagnosis not present

## 2020-01-14 NOTE — Progress Notes (Signed)
Pulmonary Individual Treatment Plan  Patient Details  Name: KAMAREON SCIANDRA MRN: [id number] Date of Birth: [birth year] Referring Provider:     Pulmonary Rehab from 11/20/2019 in [facility] Cardiac and Pulmonary Rehab  Referring Provider Romona Curls MD      Initial Encounter Date:    Pulmonary Rehab from 11/20/2019 in [facility] Cardiac and Pulmonary Rehab  Date 11/20/19      Visit Diagnosis: PAH (pulmonary artery hypertension) ([place])  Patient's Home Medications on Admission:  Current Outpatient Medications:  .  amLODipine (NORVASC) 10 MG tablet, Take 1 tablet (10 mg total) by mouth daily. (Patient not taking: Reported on 11/18/2019), Disp: 30 tablet, Rfl: 0 .  AURYXIA 1 GM 210 MG(Fe) tablet, Take 1 tablet by mouth 4 (four) times daily -  before meals and at bedtime., Disp: , Rfl:  .  B Complex-C-Folic Acid (RENA-VITE RX) 1 MG TABS, Take 1 tablet by mouth daily., Disp: , Rfl:  .  budesonide-formoterol (SYMBICORT) 80-4.5 MCG/ACT inhaler, Inhale 2 puffs into the lungs 2 (two) times daily. (Patient not taking: Reported on 11/18/2019), Disp: 1 Inhaler, Rfl: 12 .  carvedilol (COREG) 25 MG tablet, Take 1 tablet (25 mg total) by mouth 2 (two) times daily., Disp: 180 tablet, Rfl: 3 .  carvedilol (COREG) 6.25 MG tablet, Take 6.25 mg by mouth. (Patient not taking: Reported on 11/18/2019), Disp: , Rfl:  .  cetirizine (ZYRTEC) 10 MG tablet, Take 1 tablet (10 mg total) by mouth daily. (Patient not taking: Reported on 10/02/2019), Disp: 30 tablet, Rfl: 11 .  cloNIDine (CATAPRES - DOSED IN MG/24 HR) 0.3 mg/24hr patch, Place 1 patch (0.3 mg total) onto the skin once a week. Please schedule office visit for further refills. Thank you!, Disp: 2 patch, Rfl: 0 .  DESCOVY 200-25 MG tablet, Take 1 tablet by mouth daily., Disp: , Rfl:  .  diclofenac sodium (VOLTAREN) 1 % GEL, Apply 2 g topically 4 (four) times daily. (Patient not taking: Reported on 10/02/2019), Disp: 100 g, Rfl: 0 .  Febuxostat 80 MG TABS, Take 1 tablet by  mouth daily. (Patient not taking: Reported on 10/02/2019), Disp: , Rfl:  .  ferrous sulfate 325 (65 FE) MG tablet, Take 1 tablet by mouth daily. (Patient not taking: Reported on 11/18/2019), Disp: , Rfl:  .  fluticasone (FLONASE) 50 MCG/ACT nasal spray, Place into the nose. (Patient not taking: Reported on 10/02/2019), Disp: , Rfl:  .  furosemide (LASIX) 40 MG tablet, Take 80 mg by mouth daily.  (Patient not taking: Reported on 10/02/2019), Disp: , Rfl:  .  hydrALAZINE (APRESOLINE) 100 MG tablet, Take 150 mg by mouth 3 (three) times daily. (Patient not taking: Reported on 11/18/2019), Disp: , Rfl:  .  methocarbamol (ROBAXIN) 500 MG tablet, TAKE 1 TABLET (500 MG TOTAL) BY MOUTH 2 (TWO) TIMES DAILY AS NEEDED. FOR PAIN (Patient not taking: Reported on 11/18/2019), Disp: 60 tablet, Rfl: 0 .  ondansetron (ZOFRAN-ODT) 4 MG disintegrating tablet, Take 4 mg by mouth every 8 (eight) hours as needed. (Patient not taking: Reported on 10/02/2019), Disp: , Rfl:  .  predniSONE (DELTASONE) 10 MG tablet, Take 1 tablet (10 mg total) by mouth 2 (two) times daily as needed. Prn gout attack for 2 days, Disp: 10 tablet, Rfl: 0 .  sevelamer carbonate (RENVELA) 800 MG tablet, Take 800 mg by mouth 3 (three) times daily. (Patient not taking: Reported on 10/02/2019), Disp: , Rfl:  .  Tiotropium Bromide Monohydrate (SPIRIVA RESPIMAT) 1.25 MCG/ACT AERS, INHALE 2 PUFFS BY  MOUTH INTO THE LUNGS DAILY, Disp: 4 g, Rfl: 3 .  TIVICAY 50 MG tablet, Take 1 tablet by mouth daily., Disp: , Rfl:  .  valACYclovir (VALTREX) 500 MG tablet, Take 500 mg by mouth daily as needed (for flare ups). , Disp: , Rfl:   Past Medical History: Past Medical History:  Diagnosis Date  . Chronic kidney disease    esrd  . Dermatophytosis of foot 06/23/2004  . Environmental allergies   . ESRD (end stage renal disease) on dialysis ([place])   . Gout   . HIV infection ([place]) 12/1995  . Hypertension   . Seborrhea 06/23/2004    Tobacco Use: Social History   Tobacco  Use  Smoking Status Never Smoker  Smokeless Tobacco Never Used    Labs: Recent Review Scientist, physiological    Labs for ITP Cardiac and Pulmonary Rehab Latest Ref Rng & Units 06/09/2010 08/09/2011 08/23/2016   Cholestrol 0 - 200 mg/dL 186 179 -   LDLCALC 0 - 99 mg/dL 117(H) 107(H) -   HDL >39 mg/dL 55 54 -   Trlycerides <150 mg/dL 69 89 -   Hemoglobin A1c 4.8 - 5.6 % - - 5.1       Pulmonary Assessment Scores:  Pulmonary Assessment Scores    Row Name 11/20/19 1008         ADL UCSD   ADL Phase Entry     SOB Score total 5     Rest 0     Walk 0     Stairs 0     Bath 0     Dress 0     Shop 0       CAT Score   CAT Score 8       mMRC Score   mMRC Score 1            UCSD: Self-administered rating of dyspnea associated with activities of daily living (ADLs) 6-point scale (0 = "not at all" to 5 = "maximal or unable to do because of breathlessness")  Scoring Scores range from 0 to 120.  Minimally important difference is 5 units  CAT: CAT can identify the health impairment of COPD patients and is better correlated with disease progression.  CAT has a scoring range of zero to 40. The CAT score is classified into four groups of low (less than 10), medium (10 - 20), high (21-30) and very high (31-40) based on the impact level of disease on health status. A CAT score over 10 suggests significant symptoms.  A worsening CAT score could be explained by an exacerbation, poor medication adherence, poor inhaler technique, or progression of COPD or comorbid conditions.  CAT MCID is 2 points  mMRC: mMRC (Modified Medical Research Council) Dyspnea Scale is used to assess the degree of baseline functional disability in patients of respiratory disease due to dyspnea. No minimal important difference is established. A decrease in score of 1 point or greater is considered a positive change.   Pulmonary Function Assessment:  Pulmonary Function Assessment - 11/18/19 0838      Breath   Shortness of  Breath Limiting activity;Yes           Exercise Target Goals: Exercise Program Goal: Individual exercise prescription set using results from initial 6 min walk test and THRR while considering  patient's activity barriers and safety.   Exercise Prescription Goal: Initial exercise prescription builds to 30-45 minutes a day of aerobic activity, 2-3 days per week.  Home exercise guidelines will  be given to patient during program as part of exercise prescription that the participant will acknowledge.  Education: Aerobic Exercise: - Group verbal and visual presentation on the components of exercise prescription. Introduces F.I.T.T principle from ACSM for exercise prescriptions.  Reviews F.I.T.T. principles of aerobic exercise including progression. Written material given at graduation.   Education: Resistance Exercise: - Group verbal and visual presentation on the components of exercise prescription. Introduces F.I.T.T principle from ACSM for exercise prescriptions  Reviews F.I.T.T. principles of resistance exercise including progression. Written material given at graduation.    Education: Exercise & Equipment Safety: - Individual verbal instruction and demonstration of equipment use and safety with use of the equipment.   Pulmonary Rehab from 11/18/2019 in [facility] Cardiac and Pulmonary Rehab  Date 11/18/19  Educator [facility]  Instruction Review Code 1- Verbalizes Understanding      Education: Exercise Physiology & General Exercise Guidelines: - Group verbal and written instruction with models to review the exercise physiology of the cardiovascular system and associated critical values. Provides general exercise guidelines with specific guidelines to those with heart or lung disease.    Education: Flexibility, Balance, Mind/Body Relaxation: - Group verbal and visual presentation with interactive activity on the components of exercise prescription. Introduces F.I.T.T principle from ACSM for  exercise prescriptions. Reviews F.I.T.T. principles of flexibility and balance exercise training including progression. Also discusses the mind body connection.  Reviews various relaxation techniques to help reduce and manage stress (i.e. Deep breathing, progressive muscle relaxation, and visualization). Balance handout provided to take home. Written material given at graduation.   Activity Barriers & Risk Stratification:  Activity Barriers & Cardiac Risk Stratification - 11/20/19 1005      Activity Barriers & Cardiac Risk Stratification   Activity Barriers Shortness of Breath           6 Minute Walk:  6 Minute Walk    Row Name 11/20/19 1009         6 Minute Walk   Phase Initial     Distance 1045 feet     Walk Time 5.2 minutes  Stopped at 5:15 due to O2 desaturation     # of Rest Breaks 0     MPH 2.28     METS 3.43     RPE 11     Perceived Dyspnea  0     VO2 Peak 12.03     Symptoms No     Resting HR 84 bpm     Resting BP 118/74     Resting Oxygen Saturation  98 %     Exercise Oxygen Saturation  during 6 min walk 79 %  Stopped 6MWT at 5:15 minutes     Max Ex. HR 93 bpm     Max Ex. BP 140/76     2 Minute Post BP 120/74       Interval HR   1 Minute HR 88     2 Minute HR 91     3 Minute HR 91     4 Minute HR 89     5 Minute HR 91     6 Minute HR 93     2 Minute Post HR 87     Interval Heart Rate? Yes       Interval Oxygen   Interval Oxygen? Yes     Baseline Oxygen Saturation % 98 %     1 Minute Oxygen Saturation % 94 %     1 Minute Liters of Oxygen 0 L  2 Minute Oxygen Saturation % 88 %     2 Minute Liters of Oxygen 0 L     3 Minute Oxygen Saturation % 81 %     3 Minute Liters of Oxygen 0 L     4 Minute Oxygen Saturation % 88 %     4 Minute Liters of Oxygen 0 L     5 Minute Oxygen Saturation % 83 %     5 Minute Liters of Oxygen 0 L     6 Minute Oxygen Saturation % 79 %     6 Minute Liters of Oxygen 0 L     2 Minute Post Oxygen Saturation % 94 %     2  Minute Post Liters of Oxygen 0 L           Oxygen Initial Assessment:  Oxygen Initial Assessment - 11/20/19 1007      Home Oxygen   Home Oxygen Device None    Sleep Oxygen Prescription None    Home Exercise Oxygen Prescription None    Home Resting Oxygen Prescription None      Initial 6 min Walk   Oxygen Used None      Program Oxygen Prescription   Program Oxygen Prescription None      Intervention   Short Term Goals To learn and understand importance of monitoring SPO2 with pulse oximeter and demonstrate accurate use of the pulse oximeter.;To learn and understand importance of maintaining oxygen saturations>88%;To learn and demonstrate proper pursed lip breathing techniques or other breathing techniques.;To learn and demonstrate proper use of respiratory medications    Long  Term Goals Verbalizes importance of monitoring SPO2 with pulse oximeter and return demonstration;Maintenance of O2 saturations>88%;Exhibits proper breathing techniques, such as pursed lip breathing or other method taught during program session;Compliance with respiratory medication;Demonstrates proper use of MDI's           Oxygen Re-Evaluation:   Oxygen Discharge (Final Oxygen Re-Evaluation):   Initial Exercise Prescription:  Initial Exercise Prescription - 11/20/19 1000      Date of Initial Exercise RX and Referring Provider   Date 11/20/19    Referring Provider Romona Curls MD      Treadmill   MPH 1.8    Grade 0    Minutes 15    METs 2.3      NuStep   Level 1    SPM 80    Minutes 15    METs 3.4      REL-XR   Level 1    Speed 50    Minutes 15    METs 3.4      Biostep-RELP   Level 1    SPM 50    Minutes 15    METs 3.4      Prescription Details   Frequency (times per week) 2    Duration Progress to 30 minutes of continuous aerobic without signs/symptoms of physical distress      Intensity   THRR 40-80% of Max Heartrate 115-147    Ratings of Perceived Exertion 11-13     Perceived Dyspnea 0-4      Progression   Progression Continue to progress workloads to maintain intensity without signs/symptoms of physical distress.      Resistance Training   Training Prescription Yes    Weight 4 lb    Reps 10-15           Perform Capillary Blood Glucose checks as needed.  Exercise Prescription Changes:  Exercise Prescription Changes  [place] Name 11/20/19 1000             Response to Exercise   Blood Pressure (Admit) 118/74       Blood Pressure (Exercise) 140/76       Blood Pressure (Exit) 124/82       Heart Rate (Admit) 84 bpm       Heart Rate (Exercise) 93 bpm       Heart Rate (Exit) 80 bpm       Oxygen Saturation (Admit) 98 %       Oxygen Saturation (Exercise) 79 %  Stopped 6MWT due to O2 desaturation       Oxygen Saturation (Exit) 98 %       Rating of Perceived Exertion (Exercise) 11       Perceived Dyspnea (Exercise) 0       Symptoms none       Comments walk test results       Duration Progress to 30 minutes of  aerobic without signs/symptoms of physical distress              Exercise Comments:   Exercise Goals and Review:  Exercise Goals    Row Name 11/20/19 1021             Exercise Goals   Increase Physical Activity Yes       Intervention Provide advice, education, support and counseling about physical activity/exercise needs.;Develop an individualized exercise prescription for aerobic and resistive training based on initial evaluation findings, risk stratification, comorbidities and participant's personal goals.       Expected Outcomes Short Term: Attend rehab on a regular basis to increase amount of physical activity.;Long Term: Add in home exercise to make exercise part of routine and to increase amount of physical activity.;Long Term: Exercising regularly at least 3-5 days a week.       Increase Strength and Stamina Yes       Intervention Provide advice, education, support and counseling about physical activity/exercise  needs.;Develop an individualized exercise prescription for aerobic and resistive training based on initial evaluation findings, risk stratification, comorbidities and participant's personal goals.       Expected Outcomes Short Term: Increase workloads from initial exercise prescription for resistance, speed, and METs.;Short Term: Perform resistance training exercises routinely during rehab and add in resistance training at home;Long Term: Improve cardiorespiratory fitness, muscular endurance and strength as measured by increased METs and functional capacity (6MWT)       Able to understand and use rate of perceived exertion (RPE) scale Yes       Intervention Provide education and explanation on how to use RPE scale       Expected Outcomes Short Term: Able to use RPE daily in rehab to express subjective intensity level;Long Term:  Able to use RPE to guide intensity level when exercising independently       Able to understand and use Dyspnea scale Yes       Intervention Provide education and explanation on how to use Dyspnea scale       Expected Outcomes Short Term: Able to use Dyspnea scale daily in rehab to express subjective sense of shortness of breath during exertion;Long Term: Able to use Dyspnea scale to guide intensity level when exercising independently       Knowledge and understanding of Target Heart Rate Range (THRR) Yes       Intervention Provide education and explanation of THRR including how the numbers were predicted and where they are located  for reference       Expected Outcomes Short Term: Able to state/look up THRR;Short Term: Able to use daily as guideline for intensity in rehab;Long Term: Able to use THRR to govern intensity when exercising independently       Able to check pulse independently Yes       Intervention Provide education and demonstration on how to check pulse in carotid and radial arteries.;Review the importance of being able to check your own pulse for safety during  independent exercise       Expected Outcomes Short Term: Able to explain why pulse checking is important during independent exercise;Long Term: Able to check pulse independently and accurately       Understanding of Exercise Prescription Yes       Intervention Provide education, explanation, and written materials on patient's individual exercise prescription       Expected Outcomes Short Term: Able to explain program exercise prescription;Long Term: Able to explain home exercise prescription to exercise independently              Exercise Goals Re-Evaluation :  Exercise Goals Re-Evaluation    Row Name 12/02/19 1458 12/30/19 0746           Exercise Goal Re-Evaluation   Comments Has not started classes yet. Nicole Kindred still has yet to start his full day of exercise. Oriented on 11/20/19.  Has another cardiology appt scheduled for tomorrow.             Discharge Exercise Prescription (Final Exercise Prescription Changes):  Exercise Prescription Changes - 11/20/19 1000      Response to Exercise   Blood Pressure (Admit) 118/74    Blood Pressure (Exercise) 140/76    Blood Pressure (Exit) 124/82    Heart Rate (Admit) 84 bpm    Heart Rate (Exercise) 93 bpm    Heart Rate (Exit) 80 bpm    Oxygen Saturation (Admit) 98 %    Oxygen Saturation (Exercise) 79 %   Stopped 6MWT due to O2 desaturation   Oxygen Saturation (Exit) 98 %    Rating of Perceived Exertion (Exercise) 11    Perceived Dyspnea (Exercise) 0    Symptoms none    Comments walk test results    Duration Progress to 30 minutes of  aerobic without signs/symptoms of physical distress           Nutrition:  Target Goals: Understanding of nutrition guidelines, daily intake of sodium 1500mg , cholesterol 200mg , calories 30% from fat and 7% or less from saturated fats, daily to have 5 or more servings of fruits and vegetables.  Education: All About Nutrition: -Group instruction provided by verbal, written material, interactive  activities, discussions, models, and posters to present general guidelines for heart healthy nutrition including fat, fiber, MyPlate, the role of sodium in heart healthy nutrition, utilization of the nutrition label, and utilization of this knowledge for meal planning. Follow up email sent as well. Written material given at graduation.   Biometrics:  Pre Biometrics - 11/20/19 1003      Pre Biometrics   Height 6' 1.2" (1.859 m)    Weight 189 lb 1.6 oz (85.8 kg)    BMI (Calculated) 24.82    Single Leg Stand 26 seconds            Nutrition Therapy Plan and Nutrition Goals:   Nutrition Assessments:  Nutrition Assessments - 11/20/19 1007      MEDFICTS Scores   Pre Score 42  MEDIFICTS Score Key:  ?70 Need to make dietary changes   40-70 Heart Healthy Diet  ? 40 Therapeutic Level Cholesterol Diet   Picture Your Plate Scores:  <42 Unhealthy dietary pattern with much room for improvement.  41-50 Dietary pattern unlikely to meet recommendations for good health and room for improvement.  51-60 More healthful dietary pattern, with some room for improvement.   >60 Healthy dietary pattern, although there may be some specific behaviors that could be improved.   Nutrition Goals Re-Evaluation:   Nutrition Goals Discharge (Final Nutrition Goals Re-Evaluation):   Psychosocial: Target Goals: Acknowledge presence or absence of significant depression and/or stress, maximize coping skills, provide positive support system. Participant is able to verbalize types and ability to use techniques and skills needed for reducing stress and depression.   Education: Stress, Anxiety, and Depression - Group verbal and visual presentation to define topics covered.  Reviews how body is impacted by stress, anxiety, and depression.  Also discusses healthy ways to reduce stress and to treat/manage anxiety and depression.  Written material given at graduation.   Education: Sleep  Hygiene -Provides group verbal and written instruction about how sleep can affect your health.  Define sleep hygiene, discuss sleep cycles and impact of sleep habits. Review good sleep hygiene tips.    Initial Review & Psychosocial Screening:  Initial Psych Review & Screening - 11/18/19 0838      Initial Review   Current issues with None Identified      Family Dynamics   Good Support System? Yes    Comments Patient reports no issues with their current mental states, sleep, stress, depression or anxiety. Will follow up with patient in a few weeks for any changes.      Barriers   Psychosocial barriers to participate in program There are no identifiable barriers or psychosocial needs.      Screening Interventions   Interventions Encouraged to exercise;To provide support and resources with identified psychosocial needs;Provide feedback about the scores to participant    Expected Outcomes Short Term goal: Utilizing psychosocial counselor, staff and physician to assist with identification of specific Stressors or current issues interfering with healing process. Setting desired goal for each stressor or current issue identified.;Long Term Goal: Stressors or current issues are controlled or eliminated.;Short Term goal: Identification and review with participant of any Quality of Life or Depression concerns found by scoring the questionnaire.;Long Term goal: The participant improves quality of Life and PHQ9 Scores as seen by post scores and/or verbalization of changes           Quality of Life Scores:  Scores of 19 and below usually indicate a poorer quality of life in these areas.  A difference of  2-3 points is a clinically meaningful difference.  A difference of 2-3 points in the total score of the Quality of Life Index has been associated with significant improvement in overall quality of life, self-image, physical symptoms, and general health in studies assessing change in quality of  life.  PHQ-9: Recent Review Flowsheet Data    Depression screen [facility] 2/9 11/20/2019 10/02/2019 08/08/2019 04/28/2019 04/21/2019   Decreased Interest 3  0 0 0 0   Down, Depressed, Hopeless 0 0 0 0 0   PHQ - 2 Score 3 0 0 0 0   Altered sleeping 0 - 0 1 1   Tired, decreased energy 2 - 0 1 2   Change in appetite 0 - 0 0 0   Feeling bad or failure about  yourself  0 - 0 0 0   Trouble concentrating 0 - 0 0 0   Moving slowly or fidgety/restless 0 - 0 0 0   Suicidal thoughts 0 - 0 0 0   PHQ-9 Score 5 - 0 2 3   Difficult doing work/chores Not difficult at all - - Not difficult at all Not difficult at all     Interpretation of Total Score  Total Score Depression Severity:  1-4 = Minimal depression, 5-9 = Mild depression, 10-14 = Moderate depression, 15-19 = Moderately severe depression, 20-27 = Severe depression   Psychosocial Evaluation and Intervention:  Psychosocial Evaluation - 11/18/19 0849      Psychosocial Evaluation & Interventions   Interventions Encouraged to exercise with the program and follow exercise prescription;Relaxation education;Stress management education    Comments Patient reports no issues with their current mental states, sleep, stress, depression or anxiety. Will follow up with patient in a few weeks for any changes.    Expected Outcomes Short: Exercise regularly to support mental health and notify staff of any changes. Long: maintain mental health and well being through teaching of rehab or prescribed medications independently.    Continue Psychosocial Services  Follow up required by staff           Psychosocial Re-Evaluation:   Psychosocial Discharge (Final Psychosocial Re-Evaluation):   Education: Education Goals: Education classes will be provided on a weekly basis, covering required topics. Participant will state understanding/return demonstration of topics presented.  Learning Barriers/Preferences:  Learning Barriers/Preferences - 11/18/19 0837       Learning Barriers/Preferences   Learning Barriers None    Learning Preferences None           General Pulmonary Education Topics:  Infection Prevention: - Provides verbal and written material to individual with discussion of infection control including proper hand washing and proper equipment cleaning during exercise session.   Pulmonary Rehab from 11/18/2019 in [facility] Cardiac and Pulmonary Rehab  Date 11/18/19  Educator [facility]  Instruction Review Code 1- Verbalizes Understanding      Falls Prevention: - Provides verbal and written material to individual with discussion of falls prevention and safety.   Pulmonary Rehab from 11/18/2019 in [facility] Cardiac and Pulmonary Rehab  Date 11/18/19  Educator [facility]  Instruction Review Code 1- Verbalizes Understanding      Chronic Lung Disease Review: - Group verbal instruction with posters, models, PowerPoint presentations and videos,  to review new updates, new respiratory medications, new advancements in procedures and treatments. Providing information on websites and "800" numbers for continued self-education. Includes information about supplement oxygen, available portable oxygen systems, continuous and intermittent flow rates, oxygen safety, concentrators, and Medicare reimbursement for oxygen. Explanation of Pulmonary Drugs, including class, frequency, complications, importance of spacers, rinsing mouth after steroid MDI's, and proper cleaning methods for nebulizers. Review of basic lung anatomy and physiology related to function, structure, and complications of lung disease. Review of risk factors. Discussion about methods for diagnosing sleep apnea and types of masks and machines for OSA. Includes a review of the use of types of environmental controls: home humidity, furnaces, filters, dust mite/pet prevention, HEPA vacuums. Discussion about weather changes, air quality and the benefits of nasal washing. Instruction on Warning signs, infection symptoms,  calling MD promptly, preventive modes, and value of vaccinations. Review of effective airway clearance, coughing and/or vibration techniques. Emphasizing that all should Create an Action Plan. Written material given at graduation.   AED/CPR: - Group verbal and written instruction with the  use of models to demonstrate the basic use of the AED with the basic ABC's of resuscitation.    Anatomy and Cardiac Procedures: - Group verbal and visual presentation and models provide information about basic cardiac anatomy and function. Reviews the testing methods done to diagnose heart disease and the outcomes of the test results. Describes the treatment choices: Medical Management, Angioplasty, or Coronary Bypass Surgery for treating various heart conditions including Myocardial Infarction, Angina, Valve Disease, and Cardiac Arrhythmias.  Written material given at graduation.   Medication Safety: - Group verbal and visual instruction to review commonly prescribed medications for heart and lung disease. Reviews the medication, class of the drug, and side effects. Includes the steps to properly store meds and maintain the prescription regimen.  Written material given at graduation.   Other: -Provides group and verbal instruction on various topics (see comments)   Knowledge Questionnaire Score:  Knowledge Questionnaire Score - 11/20/19 1006      Knowledge Questionnaire Score   Pre Score 17/18: Oxygen            Core Components/Risk Factors/Patient Goals at Admission:  Personal Goals and Risk Factors at Admission - 11/20/19 1022      Core Components/Risk Factors/Patient Goals on Admission    Weight Management Yes;Weight Maintenance    Intervention Weight Management: Develop a combined nutrition and exercise program designed to reach desired caloric intake, while maintaining appropriate intake of nutrient and fiber, sodium and fats, and appropriate energy expenditure required for the weight  goal.;Weight Management: Provide education and appropriate resources to help participant work on and attain dietary goals.;Weight Management/Obesity: Establish reasonable short term and long term weight goals.    Admit Weight 189 lb 1.6 oz (85.8 kg)    Goal Weight: Short Term 189 lb 1.6 oz (85.8 kg)    Goal Weight: Long Term 189 lb 1.6 oz (85.8 kg)    Expected Outcomes Short Term: Continue to assess and modify interventions until short term weight is achieved;Long Term: Adherence to nutrition and physical activity/exercise program aimed toward attainment of established weight goal;Weight Maintenance: Understanding of the daily nutrition guidelines, which includes 25-35% calories from fat, 7% or less cal from saturated fats, less than 200mg  cholesterol, less than 1.5gm of sodium, & 5 or more servings of fruits and vegetables daily;Understanding recommendations for meals to include 15-35% energy as protein, 25-35% energy from fat, 35-60% energy from carbohydrates, less than 200mg  of dietary cholesterol, 20-35 gm of total fiber daily;Understanding of distribution of calorie intake throughout the day with the consumption of 4-5 meals/snacks    Improve shortness of breath with ADL's Yes    Intervention Provide education, individualized exercise plan and daily activity instruction to help decrease symptoms of SOB with activities of daily living.    Expected Outcomes Short Term: Improve cardiorespiratory fitness to achieve a reduction of symptoms when performing ADLs;Long Term: Be able to perform more ADLs without symptoms or delay the onset of symptoms    Intervention Provide a combined exercise and nutrition program that is supplemented with education, support and counseling about heart failure. Directed toward relieving symptoms such as shortness of breath, decreased exercise tolerance, and extremity edema.    Expected Outcomes Improve functional capacity of life;Short term: Attendance in program 2-3 days a  week with increased exercise capacity. Reported lower sodium intake. Reported increased fruit and vegetable intake. Reports medication compliance.;Short term: Daily weights obtained and reported for increase. Utilizing diuretic protocols set by physician.;Long term: Adoption of self-care skills and reduction  of barriers for early signs and symptoms recognition and intervention leading to self-care maintenance.    Hypertension Yes    Intervention Provide education on lifestyle modifcations including regular physical activity/exercise, weight management, moderate sodium restriction and increased consumption of fresh fruit, vegetables, and low fat dairy, alcohol moderation, and smoking cessation.;Monitor prescription use compliance.    Expected Outcomes Short Term: Continued assessment and intervention until BP is < 140/42mm HG in hypertensive participants. < 130/50mm HG in hypertensive participants with diabetes, heart failure or chronic kidney disease.;Long Term: Maintenance of blood pressure at goal levels.           Education:Diabetes - Individual verbal and written instruction to review signs/symptoms of diabetes, desired ranges of glucose level fasting, after meals and with exercise. Acknowledge that pre and post exercise glucose checks will be done for 3 sessions at entry of program.   Know Your Numbers and Heart Failure: - Group verbal and visual instruction to discuss disease risk factors for cardiac and pulmonary disease and treatment options.  Reviews associated critical values for Overweight/Obesity, Hypertension, Cholesterol, and Diabetes.  Discusses basics of heart failure: signs/symptoms and treatments.  Introduces Heart Failure Zone chart for action plan for heart failure.  Written material given at graduation.   Core Components/Risk Factors/Patient Goals Review:    Core Components/Risk Factors/Patient Goals at Discharge (Final Review):    ITP Comments:  ITP Comments    Row Name  11/18/19 0850 11/20/19 1003 12/02/19 1458 12/09/19 1510 12/17/19 0720   ITP Comments Virtual Visit completed. Patient informed on EP and RD appointment and 6 Minute walk test. Patient also informed of patient health questionnaires on My Chart. Patient Verbalizes understanding. Visit diagnosis can be found in [facility] 11/03/2019. Completed 6MWT and gym orientation. Initial ITP created and sent for review to Dr. Emily Filbert, Medical Director. Called to check on patient.  He has adjusted his medication and is now feeling better. He hopes to return on Thursday. Spoke with patient, he is having side effects from his medication and is waiting to change over so he feels better to come back to pulmonary rehab. Asked patient to call us as soon as he is ready to start. Patient understands. 30 Day review completed. Medical Director ITP review done, changes made as directed, and signed approval by Medical Director.   Brewer Name 12/30/19 0745 01/01/20 0850 01/14/20 1046       ITP Comments Nicole Kindred still has yet to start his full day of exercise. Oriented on 11/20/19.  Has another cardiology appt scheduled for tomorrow. Sent letter.  Will d/c at end of month is no return contact or attendance. 30 Day review completed. Medical Director ITP review done, changes made as directed, and signed approval by Medical Director.            Comments:

## 2020-01-14 NOTE — Progress Notes (Signed)
Pulmonary Individual Treatment Plan  Patient Details  Name: Louis [name] MRN: [id number] Date of Birth: [birth year] Referring Provider:     Pulmonary Rehab from 11/20/2019 in [facility] Cardiac and Pulmonary Rehab  Referring Provider Romona Curls MD      Initial Encounter Date:    Pulmonary Rehab from 11/20/2019 in [facility] Cardiac and Pulmonary Rehab  Date 11/20/19      Visit Diagnosis: No diagnosis found.  Patient's Home Medications on Admission:  Current Outpatient Medications:  .  amLODipine (NORVASC) 10 MG tablet, Take 1 tablet (10 mg total) by mouth daily. (Patient not taking: Reported on 11/18/2019), Disp: 30 tablet, Rfl: 0 .  AURYXIA 1 GM 210 MG(Fe) tablet, Take 1 tablet by mouth 4 (four) times daily -  before meals and at bedtime., Disp: , Rfl:  .  B Complex-C-Folic Acid (RENA-VITE RX) 1 MG TABS, Take 1 tablet by mouth daily., Disp: , Rfl:  .  budesonide-formoterol (SYMBICORT) 80-4.5 MCG/ACT inhaler, Inhale 2 puffs into the lungs 2 (two) times daily. (Patient not taking: Reported on 11/18/2019), Disp: 1 Inhaler, Rfl: 12 .  carvedilol (COREG) 25 MG tablet, Take 1 tablet (25 mg total) by mouth 2 (two) times daily., Disp: 180 tablet, Rfl: 3 .  carvedilol (COREG) 6.25 MG tablet, Take 6.25 mg by mouth. (Patient not taking: Reported on 11/18/2019), Disp: , Rfl:  .  cetirizine (ZYRTEC) 10 MG tablet, Take 1 tablet (10 mg total) by mouth daily. (Patient not taking: Reported on 10/02/2019), Disp: 30 tablet, Rfl: 11 .  cloNIDine (CATAPRES - DOSED IN MG/24 HR) 0.3 mg/24hr patch, Place 1 patch (0.3 mg total) onto the skin once a week. Please schedule office visit for further refills. Thank you!, Disp: 2 patch, Rfl: 0 .  DESCOVY 200-25 MG tablet, Take 1 tablet by mouth daily., Disp: , Rfl:  .  diclofenac sodium (VOLTAREN) 1 % GEL, Apply 2 g topically 4 (four) times daily. (Patient not taking: Reported on 10/02/2019), Disp: 100 g, Rfl: 0 .  Febuxostat 80 MG TABS, Take 1 tablet by mouth daily. (Patient  not taking: Reported on 10/02/2019), Disp: , Rfl:  .  ferrous sulfate 325 (65 FE) MG tablet, Take 1 tablet by mouth daily. (Patient not taking: Reported on 11/18/2019), Disp: , Rfl:  .  fluticasone (FLONASE) 50 MCG/ACT nasal spray, Place into the nose. (Patient not taking: Reported on 10/02/2019), Disp: , Rfl:  .  furosemide (LASIX) 40 MG tablet, Take 80 mg by mouth daily.  (Patient not taking: Reported on 10/02/2019), Disp: , Rfl:  .  hydrALAZINE (APRESOLINE) 100 MG tablet, Take 150 mg by mouth 3 (three) times daily. (Patient not taking: Reported on 11/18/2019), Disp: , Rfl:  .  methocarbamol (ROBAXIN) 500 MG tablet, TAKE 1 TABLET (500 MG TOTAL) BY MOUTH 2 (TWO) TIMES DAILY AS NEEDED. FOR PAIN (Patient not taking: Reported on 11/18/2019), Disp: 60 tablet, Rfl: 0 .  ondansetron (ZOFRAN-ODT) 4 MG disintegrating tablet, Take 4 mg by mouth every 8 (eight) hours as needed. (Patient not taking: Reported on 10/02/2019), Disp: , Rfl:  .  predniSONE (DELTASONE) 10 MG tablet, Take 1 tablet (10 mg total) by mouth 2 (two) times daily as needed. Prn gout attack for 2 days, Disp: 10 tablet, Rfl: 0 .  sevelamer carbonate (RENVELA) 800 MG tablet, Take 800 mg by mouth 3 (three) times daily. (Patient not taking: Reported on 10/02/2019), Disp: , Rfl:  .  Tiotropium Bromide Monohydrate (SPIRIVA RESPIMAT) 1.25 MCG/ACT AERS, INHALE 2 PUFFS BY MOUTH INTO  THE LUNGS DAILY, Disp: 4 g, Rfl: 3 .  TIVICAY 50 MG tablet, Take 1 tablet by mouth daily., Disp: , Rfl:  .  valACYclovir (VALTREX) 500 MG tablet, Take 500 mg by mouth daily as needed (for flare ups). , Disp: , Rfl:   Past Medical History: Past Medical History:  Diagnosis Date  . Chronic kidney disease    esrd  . Dermatophytosis of foot 06/23/2004  . Environmental allergies   . ESRD (end stage renal disease) on dialysis ([place])   . Gout   . HIV infection ([place]) 12/1995  . Hypertension   . Seborrhea 06/23/2004    Tobacco Use: Social History   Tobacco Use  Smoking Status  Never Smoker  Smokeless Tobacco Never Used    Labs: Recent Review Scientist, physiological    Labs for ITP Cardiac and Pulmonary Rehab Latest Ref Rng & Units 06/09/2010 08/09/2011 08/23/2016   Cholestrol 0 - 200 mg/dL 186 179 -   LDLCALC 0 - 99 mg/dL 117(H) 107(H) -   HDL >39 mg/dL 55 54 -   Trlycerides <150 mg/dL 69 89 -   Hemoglobin A1c 4.8 - 5.6 % - - 5.1       Pulmonary Assessment Scores:  Pulmonary Assessment Scores    Row Name 11/20/19 1008         ADL UCSD   ADL Phase Entry     SOB Score total 5     Rest 0     Walk 0     Stairs 0     Bath 0     Dress 0     Shop 0       CAT Score   CAT Score 8       mMRC Score   mMRC Score 1            UCSD: Self-administered rating of dyspnea associated with activities of daily living (ADLs) 6-point scale (0 = "not at all" to 5 = "maximal or unable to do because of breathlessness")  Scoring Scores range from 0 to 120.  Minimally important difference is 5 units  CAT: CAT can identify the health impairment of COPD patients and is better correlated with disease progression.  CAT has a scoring range of zero to 40. The CAT score is classified into four groups of low (less than 10), medium (10 - 20), high (21-30) and very high (31-40) based on the impact level of disease on health status. A CAT score over 10 suggests significant symptoms.  A worsening CAT score could be explained by an exacerbation, poor medication adherence, poor inhaler technique, or progression of COPD or comorbid conditions.  CAT MCID is 2 points  mMRC: mMRC (Modified Medical Research Council) Dyspnea Scale is used to assess the degree of baseline functional disability in patients of respiratory disease due to dyspnea. No minimal important difference is established. A decrease in score of 1 point or greater is considered a positive change.   Pulmonary Function Assessment:  Pulmonary Function Assessment - 11/18/19 0838      Breath   Shortness of Breath Limiting  activity;Yes           Exercise Target Goals: Exercise Program Goal: Individual exercise prescription set using results from initial 6 min walk test and THRR while considering  patient's activity barriers and safety.   Exercise Prescription Goal: Initial exercise prescription builds to 30-45 minutes a day of aerobic activity, 2-3 days per week.  Home exercise guidelines will be given  to patient during program as part of exercise prescription that the participant will acknowledge.  Education: Aerobic Exercise: - Group verbal and visual presentation on the components of exercise prescription. Introduces F.I.T.T principle from ACSM for exercise prescriptions.  Reviews F.I.T.T. principles of aerobic exercise including progression. Written material given at graduation.   Education: Resistance Exercise: - Group verbal and visual presentation on the components of exercise prescription. Introduces F.I.T.T principle from ACSM for exercise prescriptions  Reviews F.I.T.T. principles of resistance exercise including progression. Written material given at graduation.    Education: Exercise & Equipment Safety: - Individual verbal instruction and demonstration of equipment use and safety with use of the equipment.   Pulmonary Rehab from 11/18/2019 in [facility] Cardiac and Pulmonary Rehab  Date 11/18/19  Educator [facility]  Instruction Review Code 1- Verbalizes Understanding      Education: Exercise Physiology & General Exercise Guidelines: - Group verbal and written instruction with models to review the exercise physiology of the cardiovascular system and associated critical values. Provides general exercise guidelines with specific guidelines to those with heart or lung disease.    Education: Flexibility, Balance, Mind/Body Relaxation: - Group verbal and visual presentation with interactive activity on the components of exercise prescription. Introduces F.I.T.T principle from ACSM for exercise prescriptions.  Reviews F.I.T.T. principles of flexibility and balance exercise training including progression. Also discusses the mind body connection.  Reviews various relaxation techniques to help reduce and manage stress (i.e. Deep breathing, progressive muscle relaxation, and visualization). Balance handout provided to take home. Written material given at graduation.   Activity Barriers & Risk Stratification:  Activity Barriers & Cardiac Risk Stratification - 11/20/19 1005      Activity Barriers & Cardiac Risk Stratification   Activity Barriers Shortness of Breath           6 Minute Walk:  6 Minute Walk    Row Name 11/20/19 1009         6 Minute Walk   Phase Initial     Distance 1045 feet     Walk Time 5.2 minutes  Stopped at 5:15 due to O2 desaturation     # of Rest Breaks 0     MPH 2.28     METS 3.43     RPE 11     Perceived Dyspnea  0     VO2 Peak 12.03     Symptoms No     Resting HR 84 bpm     Resting BP 118/74     Resting Oxygen Saturation  98 %     Exercise Oxygen Saturation  during 6 min walk 79 %  Stopped 6MWT at 5:15 minutes     Max Ex. HR 93 bpm     Max Ex. BP 140/76     2 Minute Post BP 120/74       Interval HR   1 Minute HR 88     2 Minute HR 91     3 Minute HR 91     4 Minute HR 89     5 Minute HR 91     6 Minute HR 93     2 Minute Post HR 87     Interval Heart Rate? Yes       Interval Oxygen   Interval Oxygen? Yes     Baseline Oxygen Saturation % 98 %     1 Minute Oxygen Saturation % 94 %     1 Minute Liters of Oxygen 0 L  2 Minute Oxygen Saturation % 88 %     2 Minute Liters of Oxygen 0 L     3 Minute Oxygen Saturation % 81 %     3 Minute Liters of Oxygen 0 L     4 Minute Oxygen Saturation % 88 %     4 Minute Liters of Oxygen 0 L     5 Minute Oxygen Saturation % 83 %     5 Minute Liters of Oxygen 0 L     6 Minute Oxygen Saturation % 79 %     6 Minute Liters of Oxygen 0 L     2 Minute Post Oxygen Saturation % 94 %     2 Minute Post Liters of  Oxygen 0 L           Oxygen Initial Assessment:  Oxygen Initial Assessment - 11/20/19 1007      Home Oxygen   Home Oxygen Device None    Sleep Oxygen Prescription None    Home Exercise Oxygen Prescription None    Home Resting Oxygen Prescription None      Initial 6 min Walk   Oxygen Used None      Program Oxygen Prescription   Program Oxygen Prescription None      Intervention   Short Term Goals To learn and understand importance of monitoring SPO2 with pulse oximeter and demonstrate accurate use of the pulse oximeter.;To learn and understand importance of maintaining oxygen saturations>88%;To learn and demonstrate proper pursed lip breathing techniques or other breathing techniques.;To learn and demonstrate proper use of respiratory medications    Long  Term Goals Verbalizes importance of monitoring SPO2 with pulse oximeter and return demonstration;Maintenance of O2 saturations>88%;Exhibits proper breathing techniques, such as pursed lip breathing or other method taught during program session;Compliance with respiratory medication;Demonstrates proper use of MDI's           Oxygen Re-Evaluation:   Oxygen Discharge (Final Oxygen Re-Evaluation):   Initial Exercise Prescription:  Initial Exercise Prescription - 11/20/19 1000      Date of Initial Exercise RX and Referring Provider   Date 11/20/19    Referring Provider Romona Curls MD      Treadmill   MPH 1.8    Grade 0    Minutes 15    METs 2.3      NuStep   Level 1    SPM 80    Minutes 15    METs 3.4      REL-XR   Level 1    Speed 50    Minutes 15    METs 3.4      Biostep-RELP   Level 1    SPM 50    Minutes 15    METs 3.4      Prescription Details   Frequency (times per week) 2    Duration Progress to 30 minutes of continuous aerobic without signs/symptoms of physical distress      Intensity   THRR 40-80% of Max Heartrate 115-147    Ratings of Perceived Exertion 11-13    Perceived Dyspnea 0-4        Progression   Progression Continue to progress workloads to maintain intensity without signs/symptoms of physical distress.      Resistance Training   Training Prescription Yes    Weight 4 lb    Reps 10-15           Perform Capillary Blood Glucose checks as needed.  Exercise Prescription Changes:  Exercise Prescription  Changes    Row Name 11/20/19 1000             Response to Exercise   Blood Pressure (Admit) 118/74       Blood Pressure (Exercise) 140/76       Blood Pressure (Exit) 124/82       Heart Rate (Admit) 84 bpm       Heart Rate (Exercise) 93 bpm       Heart Rate (Exit) 80 bpm       Oxygen Saturation (Admit) 98 %       Oxygen Saturation (Exercise) 79 %  Stopped 6MWT due to O2 desaturation       Oxygen Saturation (Exit) 98 %       Rating of Perceived Exertion (Exercise) 11       Perceived Dyspnea (Exercise) 0       Symptoms none       Comments walk test results       Duration Progress to 30 minutes of  aerobic without signs/symptoms of physical distress              Exercise Comments:   Exercise Goals and Review:  Exercise Goals    Row Name 11/20/19 1021             Exercise Goals   Increase Physical Activity Yes       Intervention Provide advice, education, support and counseling about physical activity/exercise needs.;Develop an individualized exercise prescription for aerobic and resistive training based on initial evaluation findings, risk stratification, comorbidities and participant's personal goals.       Expected Outcomes Short Term: Attend rehab on a regular basis to increase amount of physical activity.;Long Term: Add in home exercise to make exercise part of routine and to increase amount of physical activity.;Long Term: Exercising regularly at least 3-5 days a week.       Increase Strength and Stamina Yes       Intervention Provide advice, education, support and counseling about physical activity/exercise needs.;Develop an individualized  exercise prescription for aerobic and resistive training based on initial evaluation findings, risk stratification, comorbidities and participant's personal goals.       Expected Outcomes Short Term: Increase workloads from initial exercise prescription for resistance, speed, and METs.;Short Term: Perform resistance training exercises routinely during rehab and add in resistance training at home;Long Term: Improve cardiorespiratory fitness, muscular endurance and strength as measured by increased METs and functional capacity (6MWT)       Able to understand and use rate of perceived exertion (RPE) scale Yes       Intervention Provide education and explanation on how to use RPE scale       Expected Outcomes Short Term: Able to use RPE daily in rehab to express subjective intensity level;Long Term:  Able to use RPE to guide intensity level when exercising independently       Able to understand and use Dyspnea scale Yes       Intervention Provide education and explanation on how to use Dyspnea scale       Expected Outcomes Short Term: Able to use Dyspnea scale daily in rehab to express subjective sense of shortness of breath during exertion;Long Term: Able to use Dyspnea scale to guide intensity level when exercising independently       Knowledge and understanding of Target Heart Rate Range (THRR) Yes       Intervention Provide education and explanation of THRR including how the numbers were predicted and  where they are located for reference       Expected Outcomes Short Term: Able to state/look up THRR;Short Term: Able to use daily as guideline for intensity in rehab;Long Term: Able to use THRR to govern intensity when exercising independently       Able to check pulse independently Yes       Intervention Provide education and demonstration on how to check pulse in carotid and radial arteries.;Review the importance of being able to check your own pulse for safety during independent exercise       Expected  Outcomes Short Term: Able to explain why pulse checking is important during independent exercise;Long Term: Able to check pulse independently and accurately       Understanding of Exercise Prescription Yes       Intervention Provide education, explanation, and written materials on patient's individual exercise prescription       Expected Outcomes Short Term: Able to explain program exercise prescription;Long Term: Able to explain home exercise prescription to exercise independently              Exercise Goals Re-Evaluation :  Exercise Goals Re-Evaluation    Row Name 12/02/19 1458 12/30/19 0746           Exercise Goal Re-Evaluation   Comments Has not started classes yet. Nicole Kindred still has yet to start his full day of exercise. Oriented on 11/20/19.  Has another cardiology appt scheduled for tomorrow.             Discharge Exercise Prescription (Final Exercise Prescription Changes):  Exercise Prescription Changes - 11/20/19 1000      Response to Exercise   Blood Pressure (Admit) 118/74    Blood Pressure (Exercise) 140/76    Blood Pressure (Exit) 124/82    Heart Rate (Admit) 84 bpm    Heart Rate (Exercise) 93 bpm    Heart Rate (Exit) 80 bpm    Oxygen Saturation (Admit) 98 %    Oxygen Saturation (Exercise) 79 %   Stopped 6MWT due to O2 desaturation   Oxygen Saturation (Exit) 98 %    Rating of Perceived Exertion (Exercise) 11    Perceived Dyspnea (Exercise) 0    Symptoms none    Comments walk test results    Duration Progress to 30 minutes of  aerobic without signs/symptoms of physical distress           Nutrition:  Target Goals: Understanding of nutrition guidelines, daily intake of sodium 1500mg , cholesterol 200mg , calories 30% from fat and 7% or less from saturated fats, daily to have 5 or more servings of fruits and vegetables.  Education: All About Nutrition: -Group instruction provided by verbal, written material, interactive activities, discussions, models, and  posters to present general guidelines for heart healthy nutrition including fat, fiber, MyPlate, the role of sodium in heart healthy nutrition, utilization of the nutrition label, and utilization of this knowledge for meal planning. Follow up email sent as well. Written material given at graduation.   Biometrics:  Pre Biometrics - 11/20/19 1003      Pre Biometrics   Height 6' 1.2" (1.859 m)    Weight 189 lb 1.6 oz (85.8 kg)    BMI (Calculated) 24.82    Single Leg Stand 26 seconds            Nutrition Therapy Plan and Nutrition Goals:   Nutrition Assessments:  Nutrition Assessments - 11/20/19 1007      MEDFICTS Scores   Pre Score 42  MEDIFICTS Score Key:  ?70 Need to make dietary changes   40-70 Heart Healthy Diet  ? 40 Therapeutic Level Cholesterol Diet   Picture Your Plate Scores:  <84 Unhealthy dietary pattern with much room for improvement.  41-50 Dietary pattern unlikely to meet recommendations for good health and room for improvement.  51-60 More healthful dietary pattern, with some room for improvement.   >60 Healthy dietary pattern, although there may be some specific behaviors that could be improved.   Nutrition Goals Re-Evaluation:   Nutrition Goals Discharge (Final Nutrition Goals Re-Evaluation):   Psychosocial: Target Goals: Acknowledge presence or absence of significant depression and/or stress, maximize coping skills, provide positive support system. Participant is able to verbalize types and ability to use techniques and skills needed for reducing stress and depression.   Education: Stress, Anxiety, and Depression - Group verbal and visual presentation to define topics covered.  Reviews how body is impacted by stress, anxiety, and depression.  Also discusses healthy ways to reduce stress and to treat/manage anxiety and depression.  Written material given at graduation.   Education: Sleep Hygiene -Provides group verbal and written  instruction about how sleep can affect your health.  Define sleep hygiene, discuss sleep cycles and impact of sleep habits. Review good sleep hygiene tips.    Initial Review & Psychosocial Screening:  Initial Psych Review & Screening - 11/18/19 0838      Initial Review   Current issues with None Identified      Family Dynamics   Good Support System? Yes    Comments Patient reports no issues with their current mental states, sleep, stress, depression or anxiety. Will follow up with patient in a few weeks for any changes.      Barriers   Psychosocial barriers to participate in program There are no identifiable barriers or psychosocial needs.      Screening Interventions   Interventions Encouraged to exercise;To provide support and resources with identified psychosocial needs;Provide feedback about the scores to participant    Expected Outcomes Short Term goal: Utilizing psychosocial counselor, staff and physician to assist with identification of specific Stressors or current issues interfering with healing process. Setting desired goal for each stressor or current issue identified.;Long Term Goal: Stressors or current issues are controlled or eliminated.;Short Term goal: Identification and review with participant of any Quality of Life or Depression concerns found by scoring the questionnaire.;Long Term goal: The participant improves quality of Life and PHQ9 Scores as seen by post scores and/or verbalization of changes           Quality of Life Scores:  Scores of 19 and below usually indicate a poorer quality of life in these areas.  A difference of  2-3 points is a clinically meaningful difference.  A difference of 2-3 points in the total score of the Quality of Life Index has been associated with significant improvement in overall quality of life, self-image, physical symptoms, and general health in studies assessing change in quality of life.  PHQ-9: Recent Review Flowsheet Data     Depression screen [facility] 2/9 11/20/2019 10/02/2019 08/08/2019 04/28/2019 04/21/2019   Decreased Interest 3  0 0 0 0   Down, Depressed, Hopeless 0 0 0 0 0   PHQ - 2 Score 3 0 0 0 0   Altered sleeping 0 - 0 1 1   Tired, decreased energy 2 - 0 1 2   Change in appetite 0 - 0 0 0   Feeling bad or failure about  yourself  0 - 0 0 0   Trouble concentrating 0 - 0 0 0   Moving slowly or fidgety/restless 0 - 0 0 0   Suicidal thoughts 0 - 0 0 0   PHQ-9 Score 5 - 0 2 3   Difficult doing work/chores Not difficult at all - - Not difficult at all Not difficult at all     Interpretation of Total Score  Total Score Depression Severity:  1-4 = Minimal depression, 5-9 = Mild depression, 10-14 = Moderate depression, 15-19 = Moderately severe depression, 20-27 = Severe depression   Psychosocial Evaluation and Intervention:  Psychosocial Evaluation - 11/18/19 0849      Psychosocial Evaluation & Interventions   Interventions Encouraged to exercise with the program and follow exercise prescription;Relaxation education;Stress management education    Comments Patient reports no issues with their current mental states, sleep, stress, depression or anxiety. Will follow up with patient in a few weeks for any changes.    Expected Outcomes Short: Exercise regularly to support mental health and notify staff of any changes. Long: maintain mental health and well being through teaching of rehab or prescribed medications independently.    Continue Psychosocial Services  Follow up required by staff           Psychosocial Re-Evaluation:   Psychosocial Discharge (Final Psychosocial Re-Evaluation):   Education: Education Goals: Education classes will be provided on a weekly basis, covering required topics. Participant will state understanding/return demonstration of topics presented.  Learning Barriers/Preferences:  Learning Barriers/Preferences - 11/18/19 0837      Learning Barriers/Preferences   Learning Barriers None     Learning Preferences None           General Pulmonary Education Topics:  Infection Prevention: - Provides verbal and written material to individual with discussion of infection control including proper hand washing and proper equipment cleaning during exercise session.   Pulmonary Rehab from 11/18/2019 in [facility] Cardiac and Pulmonary Rehab  Date 11/18/19  Educator [facility]  Instruction Review Code 1- Verbalizes Understanding      Falls Prevention: - Provides verbal and written material to individual with discussion of falls prevention and safety.   Pulmonary Rehab from 11/18/2019 in [facility] Cardiac and Pulmonary Rehab  Date 11/18/19  Educator [facility]  Instruction Review Code 1- Verbalizes Understanding      Chronic Lung Disease Review: - Group verbal instruction with posters, models, PowerPoint presentations and videos,  to review new updates, new respiratory medications, new advancements in procedures and treatments. Providing information on websites and "800" numbers for continued self-education. Includes information about supplement oxygen, available portable oxygen systems, continuous and intermittent flow rates, oxygen safety, concentrators, and Medicare reimbursement for oxygen. Explanation of Pulmonary Drugs, including class, frequency, complications, importance of spacers, rinsing mouth after steroid MDI's, and proper cleaning methods for nebulizers. Review of basic lung anatomy and physiology related to function, structure, and complications of lung disease. Review of risk factors. Discussion about methods for diagnosing sleep apnea and types of masks and machines for OSA. Includes a review of the use of types of environmental controls: home humidity, furnaces, filters, dust mite/pet prevention, HEPA vacuums. Discussion about weather changes, air quality and the benefits of nasal washing. Instruction on Warning signs, infection symptoms, calling MD promptly, preventive modes, and value of  vaccinations. Review of effective airway clearance, coughing and/or vibration techniques. Emphasizing that all should Create an Action Plan. Written material given at graduation.   AED/CPR: - Group verbal and written instruction with the  use of models to demonstrate the basic use of the AED with the basic ABC's of resuscitation.    Anatomy and Cardiac Procedures: - Group verbal and visual presentation and models provide information about basic cardiac anatomy and function. Reviews the testing methods done to diagnose heart disease and the outcomes of the test results. Describes the treatment choices: Medical Management, Angioplasty, or Coronary Bypass Surgery for treating various heart conditions including Myocardial Infarction, Angina, Valve Disease, and Cardiac Arrhythmias.  Written material given at graduation.   Medication Safety: - Group verbal and visual instruction to review commonly prescribed medications for heart and lung disease. Reviews the medication, class of the drug, and side effects. Includes the steps to properly store meds and maintain the prescription regimen.  Written material given at graduation.   Other: -Provides group and verbal instruction on various topics (see comments)   Knowledge Questionnaire Score:  Knowledge Questionnaire Score - 11/20/19 1006      Knowledge Questionnaire Score   Pre Score 17/18: Oxygen            Core Components/Risk Factors/Patient Goals at Admission:  Personal Goals and Risk Factors at Admission - 11/20/19 1022      Core Components/Risk Factors/Patient Goals on Admission    Weight Management Yes;Weight Maintenance    Intervention Weight Management: Develop a combined nutrition and exercise program designed to reach desired caloric intake, while maintaining appropriate intake of nutrient and fiber, sodium and fats, and appropriate energy expenditure required for the weight goal.;Weight Management: Provide education and appropriate  resources to help participant work on and attain dietary goals.;Weight Management/Obesity: Establish reasonable short term and long term weight goals.    Admit Weight 189 lb 1.6 oz (85.8 kg)    Goal Weight: Short Term 189 lb 1.6 oz (85.8 kg)    Goal Weight: Long Term 189 lb 1.6 oz (85.8 kg)    Expected Outcomes Short Term: Continue to assess and modify interventions until short term weight is achieved;Long Term: Adherence to nutrition and physical activity/exercise program aimed toward attainment of established weight goal;Weight Maintenance: Understanding of the daily nutrition guidelines, which includes 25-35% calories from fat, 7% or less cal from saturated fats, less than 200mg  cholesterol, less than 1.5gm of sodium, & 5 or more servings of fruits and vegetables daily;Understanding recommendations for meals to include 15-35% energy as protein, 25-35% energy from fat, 35-60% energy from carbohydrates, less than 200mg  of dietary cholesterol, 20-35 gm of total fiber daily;Understanding of distribution of calorie intake throughout the day with the consumption of 4-5 meals/snacks    Improve shortness of breath with ADL's Yes    Intervention Provide education, individualized exercise plan and daily activity instruction to help decrease symptoms of SOB with activities of daily living.    Expected Outcomes Short Term: Improve cardiorespiratory fitness to achieve a reduction of symptoms when performing ADLs;Long Term: Be able to perform more ADLs without symptoms or delay the onset of symptoms    Intervention Provide a combined exercise and nutrition program that is supplemented with education, support and counseling about heart failure. Directed toward relieving symptoms such as shortness of breath, decreased exercise tolerance, and extremity edema.    Expected Outcomes Improve functional capacity of life;Short term: Attendance in program 2-3 days a week with increased exercise capacity. Reported lower sodium  intake. Reported increased fruit and vegetable intake. Reports medication compliance.;Short term: Daily weights obtained and reported for increase. Utilizing diuretic protocols set by physician.;Long term: Adoption of self-care skills and reduction  of barriers for early signs and symptoms recognition and intervention leading to self-care maintenance.    Hypertension Yes    Intervention Provide education on lifestyle modifcations including regular physical activity/exercise, weight management, moderate sodium restriction and increased consumption of fresh fruit, vegetables, and low fat dairy, alcohol moderation, and smoking cessation.;Monitor prescription use compliance.    Expected Outcomes Short Term: Continued assessment and intervention until BP is < 140/68mm HG in hypertensive participants. < 130/60mm HG in hypertensive participants with diabetes, heart failure or chronic kidney disease.;Long Term: Maintenance of blood pressure at goal levels.           Education:Diabetes - Individual verbal and written instruction to review signs/symptoms of diabetes, desired ranges of glucose level fasting, after meals and with exercise. Acknowledge that pre and post exercise glucose checks will be done for 3 sessions at entry of program.   Know Your Numbers and Heart Failure: - Group verbal and visual instruction to discuss disease risk factors for cardiac and pulmonary disease and treatment options.  Reviews associated critical values for Overweight/Obesity, Hypertension, Cholesterol, and Diabetes.  Discusses basics of heart failure: signs/symptoms and treatments.  Introduces Heart Failure Zone chart for action plan for heart failure.  Written material given at graduation.   Core Components/Risk Factors/Patient Goals Review:    Core Components/Risk Factors/Patient Goals at Discharge (Final Review):    ITP Comments:  ITP Comments    Row Name 11/18/19 0850 11/20/19 1003 12/02/19 1458 12/09/19 1510  12/17/19 0720   ITP Comments Virtual Visit completed. Patient informed on EP and RD appointment and 6 Minute walk test. Patient also informed of patient health questionnaires on My Chart. Patient Verbalizes understanding. Visit diagnosis can be found in [facility] 11/03/2019. Completed 6MWT and gym orientation. Initial ITP created and sent for review to Dr. Emily Filbert, Medical Director. Called to check on patient.  He has adjusted his medication and is now feeling better. He hopes to return on Thursday. Spoke with patient, he is having side effects from his medication and is waiting to change over so he feels better to come back to pulmonary rehab. Asked patient to call us as soon as he is ready to start. Patient understands. 30 Day review completed. Medical Director ITP review done, changes made as directed, and signed approval by Medical Director.   [place] Name 12/30/19 0745 01/01/20 0850 01/14/20 1046 01/14/20 1331     ITP Comments Nicole Kindred still has yet to start his full day of exercise. Oriented on 11/20/19.  Has another cardiology appt scheduled for tomorrow. Sent letter.  Will d/c at end of month is no return contact or attendance. 30 Day review completed. Medical Director ITP review done, changes made as directed, and signed approval by Medical Director. Nicole Kindred has not attended since his orientation 10/7. Sent discharge letter 11/18 with no further contact from him.           Comments: Discharge ITP.

## 2020-01-14 NOTE — Progress Notes (Signed)
Discharge Progress Report  Patient Details  Name: Louis [name] MRN: [id number] Date of Birth: Apr 17, 1960 Referring Provider:     Pulmonary Rehab from 11/20/2019 in [facility]  Referring Provider Romona Curls MD       Number of Visits: 1  Reason for Discharge:  Early Exit:  Lack of attendance  Smoking History:  Social History   Tobacco Use  Smoking Status Never Smoker  Smokeless Tobacco Never Used    Diagnosis:  No diagnosis found.  ADL UCSD:  Pulmonary Assessment Scores    Row Name 11/20/19 1008         ADL UCSD   ADL Phase Entry     SOB Score total 5     Rest 0     Walk 0     Stairs 0     Bath 0     Dress 0     Shop 0       CAT Score   CAT Score 8       mMRC Score   mMRC Score 1            Initial Exercise Prescription:  Initial Exercise Prescription - 11/20/19 1000      Date of Initial Exercise RX and Referring Provider   Date 11/20/19    Referring Provider Romona Curls MD      Treadmill   MPH 1.8    Grade 0    Minutes 15    METs 2.3      NuStep   Level 1    SPM 80    Minutes 15    METs 3.4      REL-XR   Level 1    Speed 50    Minutes 15    METs 3.4      Biostep-RELP   Level 1    SPM 50    Minutes 15    METs 3.4      Prescription Details   Frequency (times per week) 2    Duration Progress to 30 minutes of continuous aerobic without signs/symptoms of physical distress      Intensity   THRR 40-80% of Max Heartrate 115-147    Ratings of Perceived Exertion 11-13    Perceived Dyspnea 0-4      Progression   Progression Continue to progress workloads to maintain intensity without signs/symptoms of physical distress.      Resistance Training   Training Prescription Yes    Weight 4 lb    Reps 10-15           Discharge Exercise Prescription (Final Exercise Prescription Changes):  Exercise Prescription Changes - 11/20/19 1000      Response to Exercise   Blood Pressure (Admit) 118/74     Blood Pressure (Exercise) 140/76    Blood Pressure (Exit) 124/82    Heart Rate (Admit) 84 bpm    Heart Rate (Exercise) 93 bpm    Heart Rate (Exit) 80 bpm    Oxygen Saturation (Admit) 98 %    Oxygen Saturation (Exercise) 79 %   Stopped 6MWT due to O2 desaturation   Oxygen Saturation (Exit) 98 %    Rating of Perceived Exertion (Exercise) 11    Perceived Dyspnea (Exercise) 0    Symptoms none    Comments walk test results    Duration Progress to 30 minutes of  aerobic without signs/symptoms of physical distress           Functional Capacity:  6 Minute Walk    Row Name 11/20/19 1009         6 Minute Walk   Phase Initial     Distance 1045 feet     Walk Time 5.2 minutes  Stopped at 5:15 due to O2 desaturation     # of Rest Breaks 0     MPH 2.28     METS 3.43     RPE 11     Perceived Dyspnea  0     VO2 Peak 12.03     Symptoms No     Resting HR 84 bpm     Resting BP 118/74     Resting Oxygen Saturation  98 %     Exercise Oxygen Saturation  during 6 min walk 79 %  Stopped 6MWT at 5:15 minutes     Max Ex. HR 93 bpm     Max Ex. BP 140/76     2 Minute Post BP 120/74       Interval HR   1 Minute HR 88     2 Minute HR 91     3 Minute HR 91     4 Minute HR 89     5 Minute HR 91     6 Minute HR 93     2 Minute Post HR 87     Interval Heart Rate? Yes       Interval Oxygen   Interval Oxygen? Yes     Baseline Oxygen Saturation % 98 %     1 Minute Oxygen Saturation % 94 %     1 Minute Liters of Oxygen 0 L     2 Minute Oxygen Saturation % 88 %     2 Minute Liters of Oxygen 0 L     3 Minute Oxygen Saturation % 81 %     3 Minute Liters of Oxygen 0 L     4 Minute Oxygen Saturation % 88 %     4 Minute Liters of Oxygen 0 L     5 Minute Oxygen Saturation % 83 %     5 Minute Liters of Oxygen 0 L     6 Minute Oxygen Saturation % 79 %     6 Minute Liters of Oxygen 0 L     2 Minute Post Oxygen Saturation % 94 %     2 Minute Post Liters of Oxygen 0 L             Psychological, QOL, Others - Outcomes: PHQ 2/9: Depression screen [facility] 2/9 11/20/2019 10/02/2019 08/08/2019 04/28/2019 04/21/2019  Decreased Interest 3 0 0 0 0  Down, Depressed, Hopeless 0 0 0 0 0  PHQ - 2 Score 3 0 0 0 0  Altered sleeping 0 - 0 1 1  Tired, decreased energy 2 - 0 1 2  Change in appetite 0 - 0 0 0  Feeling bad or failure about yourself  0 - 0 0 0  Trouble concentrating 0 - 0 0 0  Moving slowly or fidgety/restless 0 - 0 0 0  Suicidal thoughts 0 - 0 0 0  PHQ-9 Score 5 - 0 2 3  Difficult doing work/chores Not difficult at all - - Not difficult at all Not difficult at all  Some recent data might be hidden    Quality of Life:  Nutrition & Weight - Outcomes:  Pre Biometrics - 11/20/19 1003      Pre Biometrics   Height 6' 1.2" (  1.859 m)    Weight 189 lb 1.6 oz (85.8 kg)    BMI (Calculated) 24.82    Single Leg Stand 26 seconds            Nutrition:   Nutrition Discharge:  Nutrition Assessments - 11/20/19 1007      MEDFICTS Scores   Pre Score 42           Education Questionnaire Score:  Knowledge Questionnaire Score - 11/20/19 1006      Knowledge Questionnaire Score   Pre Score 17/18: Oxygen           Goals reviewed with patient; copy given to patient.

## 2020-01-15 ENCOUNTER — Ambulatory Visit: Payer: Medicare Other

## 2020-01-16 DIAGNOSIS — K2961 Other gastritis with bleeding: Secondary | ICD-10-CM | POA: Diagnosis not present

## 2020-01-16 DIAGNOSIS — N186 End stage renal disease: Secondary | ICD-10-CM | POA: Diagnosis not present

## 2020-01-16 DIAGNOSIS — Z992 Dependence on renal dialysis: Secondary | ICD-10-CM | POA: Diagnosis not present

## 2020-01-19 DIAGNOSIS — K2961 Other gastritis with bleeding: Secondary | ICD-10-CM | POA: Diagnosis not present

## 2020-01-19 DIAGNOSIS — Z992 Dependence on renal dialysis: Secondary | ICD-10-CM | POA: Diagnosis not present

## 2020-01-19 DIAGNOSIS — N186 End stage renal disease: Secondary | ICD-10-CM | POA: Diagnosis not present

## 2020-01-20 ENCOUNTER — Ambulatory Visit: Payer: Medicare Other

## 2020-01-20 DIAGNOSIS — N186 End stage renal disease: Secondary | ICD-10-CM | POA: Diagnosis not present

## 2020-01-20 DIAGNOSIS — Z992 Dependence on renal dialysis: Secondary | ICD-10-CM | POA: Diagnosis not present

## 2020-01-20 DIAGNOSIS — K2961 Other gastritis with bleeding: Secondary | ICD-10-CM | POA: Diagnosis not present

## 2020-01-21 ENCOUNTER — Ambulatory Visit: Payer: Medicare Other | Admitting: Internal Medicine

## 2020-01-21 DIAGNOSIS — K2961 Other gastritis with bleeding: Secondary | ICD-10-CM | POA: Diagnosis not present

## 2020-01-21 DIAGNOSIS — Z992 Dependence on renal dialysis: Secondary | ICD-10-CM | POA: Diagnosis not present

## 2020-01-21 DIAGNOSIS — N186 End stage renal disease: Secondary | ICD-10-CM | POA: Diagnosis not present

## 2020-01-22 ENCOUNTER — Ambulatory Visit: Payer: Medicare Other

## 2020-01-23 ENCOUNTER — Ambulatory Visit: Payer: Medicare Other | Admitting: Internal Medicine

## 2020-01-23 DIAGNOSIS — N186 End stage renal disease: Secondary | ICD-10-CM | POA: Diagnosis not present

## 2020-01-23 DIAGNOSIS — K2961 Other gastritis with bleeding: Secondary | ICD-10-CM | POA: Diagnosis not present

## 2020-01-23 DIAGNOSIS — Z992 Dependence on renal dialysis: Secondary | ICD-10-CM | POA: Diagnosis not present

## 2020-01-26 DIAGNOSIS — Z992 Dependence on renal dialysis: Secondary | ICD-10-CM | POA: Diagnosis not present

## 2020-01-26 DIAGNOSIS — K2961 Other gastritis with bleeding: Secondary | ICD-10-CM | POA: Diagnosis not present

## 2020-01-26 DIAGNOSIS — N186 End stage renal disease: Secondary | ICD-10-CM | POA: Diagnosis not present

## 2020-01-27 ENCOUNTER — Ambulatory Visit: Payer: Medicare Other

## 2020-01-27 DIAGNOSIS — K2961 Other gastritis with bleeding: Secondary | ICD-10-CM | POA: Diagnosis not present

## 2020-01-27 DIAGNOSIS — N186 End stage renal disease: Secondary | ICD-10-CM | POA: Diagnosis not present

## 2020-01-27 DIAGNOSIS — Z992 Dependence on renal dialysis: Secondary | ICD-10-CM | POA: Diagnosis not present

## 2020-01-28 DIAGNOSIS — K2961 Other gastritis with bleeding: Secondary | ICD-10-CM | POA: Diagnosis not present

## 2020-01-28 DIAGNOSIS — Z992 Dependence on renal dialysis: Secondary | ICD-10-CM | POA: Diagnosis not present

## 2020-01-28 DIAGNOSIS — N186 End stage renal disease: Secondary | ICD-10-CM | POA: Diagnosis not present

## 2020-01-28 MED ORDER — CALCITRIOL 0.25 MCG CAPSULE
ORAL_CAPSULE | Freq: Every day | ORAL | 0 refills | 90 days | Status: CP
Start: 2020-01-28 — End: 2021-01-27

## 2020-01-29 ENCOUNTER — Ambulatory Visit: Payer: Medicare Other

## 2020-01-29 DIAGNOSIS — K2961 Other gastritis with bleeding: Secondary | ICD-10-CM | POA: Diagnosis not present

## 2020-01-29 DIAGNOSIS — Z992 Dependence on renal dialysis: Secondary | ICD-10-CM | POA: Diagnosis not present

## 2020-01-29 DIAGNOSIS — N186 End stage renal disease: Secondary | ICD-10-CM | POA: Diagnosis not present

## 2020-01-30 DIAGNOSIS — Z992 Dependence on renal dialysis: Secondary | ICD-10-CM | POA: Diagnosis not present

## 2020-01-30 DIAGNOSIS — K2961 Other gastritis with bleeding: Secondary | ICD-10-CM | POA: Diagnosis not present

## 2020-01-30 DIAGNOSIS — N186 End stage renal disease: Secondary | ICD-10-CM | POA: Diagnosis not present

## 2020-02-02 DIAGNOSIS — N186 End stage renal disease: Secondary | ICD-10-CM | POA: Diagnosis not present

## 2020-02-02 DIAGNOSIS — Z992 Dependence on renal dialysis: Secondary | ICD-10-CM | POA: Diagnosis not present

## 2020-02-02 DIAGNOSIS — K2961 Other gastritis with bleeding: Secondary | ICD-10-CM | POA: Diagnosis not present

## 2020-02-03 ENCOUNTER — Ambulatory Visit: Payer: Medicare Other

## 2020-02-03 DIAGNOSIS — Z992 Dependence on renal dialysis: Secondary | ICD-10-CM | POA: Diagnosis not present

## 2020-02-03 DIAGNOSIS — K2961 Other gastritis with bleeding: Secondary | ICD-10-CM | POA: Diagnosis not present

## 2020-02-03 DIAGNOSIS — N186 End stage renal disease: Secondary | ICD-10-CM | POA: Diagnosis not present

## 2020-02-04 DIAGNOSIS — K2961 Other gastritis with bleeding: Secondary | ICD-10-CM | POA: Diagnosis not present

## 2020-02-04 DIAGNOSIS — Z992 Dependence on renal dialysis: Secondary | ICD-10-CM | POA: Diagnosis not present

## 2020-02-04 DIAGNOSIS — N186 End stage renal disease: Secondary | ICD-10-CM | POA: Diagnosis not present

## 2020-02-05 ENCOUNTER — Ambulatory Visit: Payer: Medicare Other

## 2020-02-06 DIAGNOSIS — N186 End stage renal disease: Secondary | ICD-10-CM | POA: Diagnosis not present

## 2020-02-06 DIAGNOSIS — Z992 Dependence on renal dialysis: Secondary | ICD-10-CM | POA: Diagnosis not present

## 2020-02-06 DIAGNOSIS — K2961 Other gastritis with bleeding: Secondary | ICD-10-CM | POA: Diagnosis not present

## 2020-02-09 DIAGNOSIS — Z992 Dependence on renal dialysis: Secondary | ICD-10-CM | POA: Diagnosis not present

## 2020-02-09 DIAGNOSIS — K2961 Other gastritis with bleeding: Secondary | ICD-10-CM | POA: Diagnosis not present

## 2020-02-09 DIAGNOSIS — N186 End stage renal disease: Secondary | ICD-10-CM | POA: Diagnosis not present

## 2020-02-10 ENCOUNTER — Ambulatory Visit: Payer: Medicare Other

## 2020-02-10 DIAGNOSIS — K2961 Other gastritis with bleeding: Secondary | ICD-10-CM | POA: Diagnosis not present

## 2020-02-10 DIAGNOSIS — Z992 Dependence on renal dialysis: Secondary | ICD-10-CM | POA: Diagnosis not present

## 2020-02-10 DIAGNOSIS — N186 End stage renal disease: Secondary | ICD-10-CM | POA: Diagnosis not present

## 2020-02-11 DIAGNOSIS — K2961 Other gastritis with bleeding: Secondary | ICD-10-CM | POA: Diagnosis not present

## 2020-02-11 DIAGNOSIS — Z992 Dependence on renal dialysis: Secondary | ICD-10-CM | POA: Diagnosis not present

## 2020-02-11 DIAGNOSIS — N186 End stage renal disease: Secondary | ICD-10-CM | POA: Diagnosis not present

## 2020-02-12 ENCOUNTER — Encounter: Admit: 2020-02-12 | Discharge: 2020-02-13 | Payer: MEDICARE

## 2020-02-12 ENCOUNTER — Ambulatory Visit: Payer: Medicare Other

## 2020-02-13 DIAGNOSIS — N186 End stage renal disease: Secondary | ICD-10-CM | POA: Diagnosis not present

## 2020-02-13 DIAGNOSIS — Z992 Dependence on renal dialysis: Secondary | ICD-10-CM | POA: Diagnosis not present

## 2020-02-13 DIAGNOSIS — K2961 Other gastritis with bleeding: Secondary | ICD-10-CM | POA: Diagnosis not present

## 2020-02-17 ENCOUNTER — Ambulatory Visit: Payer: Medicare Other

## 2020-02-19 ENCOUNTER — Ambulatory Visit: Payer: Medicare Other

## 2020-02-20 DIAGNOSIS — R0602 Shortness of breath: Principal | ICD-10-CM

## 2020-02-24 ENCOUNTER — Ambulatory Visit: Payer: Medicare Other

## 2020-02-26 ENCOUNTER — Ambulatory Visit: Payer: Medicare Other

## 2020-03-02 ENCOUNTER — Ambulatory Visit: Payer: Medicare Other

## 2020-03-04 ENCOUNTER — Ambulatory Visit: Payer: Medicare Other

## 2020-03-09 ENCOUNTER — Ambulatory Visit: Payer: Medicare Other

## 2020-03-11 ENCOUNTER — Ambulatory Visit: Payer: Medicare Other

## 2020-03-12 NOTE — Progress Notes (Signed)
Re-scheduled for in person visit

## 2020-03-15 ENCOUNTER — Encounter: Admit: 2020-03-15 | Discharge: 2020-03-16 | Payer: MEDICARE

## 2020-03-15 ENCOUNTER — Ambulatory Visit (INDEPENDENT_AMBULATORY_CARE_PROVIDER_SITE_OTHER): Payer: Medicare (Managed Care) | Admitting: Family Medicine

## 2020-03-15 ENCOUNTER — Encounter: Payer: Self-pay | Admitting: Family Medicine

## 2020-03-15 DIAGNOSIS — Z008 Encounter for other general examination: Secondary | ICD-10-CM

## 2020-03-15 DIAGNOSIS — N289 Disorder of kidney and ureter, unspecified: Secondary | ICD-10-CM | POA: Insufficient documentation

## 2020-03-16 ENCOUNTER — Ambulatory Visit: Payer: Medicare Other

## 2020-03-16 ENCOUNTER — Ambulatory Visit (INDEPENDENT_AMBULATORY_CARE_PROVIDER_SITE_OTHER): Payer: Medicare Other | Admitting: Family Medicine

## 2020-03-16 ENCOUNTER — Encounter: Payer: Self-pay | Admitting: Family Medicine

## 2020-03-16 ENCOUNTER — Other Ambulatory Visit: Payer: Self-pay

## 2020-03-16 VITALS — BP 140/90 | HR 96 | Temp 98.1°F | Resp 16 | Ht 74.0 in | Wt 197.4 lb

## 2020-03-16 DIAGNOSIS — I5022 Chronic systolic (congestive) heart failure: Secondary | ICD-10-CM

## 2020-03-16 DIAGNOSIS — B20 Human immunodeficiency virus [HIV] disease: Secondary | ICD-10-CM

## 2020-03-16 DIAGNOSIS — I10 Essential (primary) hypertension: Secondary | ICD-10-CM | POA: Diagnosis not present

## 2020-03-16 DIAGNOSIS — Z992 Dependence on renal dialysis: Secondary | ICD-10-CM | POA: Diagnosis not present

## 2020-03-16 DIAGNOSIS — J432 Centrilobular emphysema: Secondary | ICD-10-CM | POA: Insufficient documentation

## 2020-03-16 DIAGNOSIS — E8779 Other fluid overload: Secondary | ICD-10-CM | POA: Diagnosis not present

## 2020-03-16 DIAGNOSIS — N186 End stage renal disease: Secondary | ICD-10-CM

## 2020-03-16 DIAGNOSIS — I700 Atherosclerosis of aorta: Secondary | ICD-10-CM

## 2020-03-16 DIAGNOSIS — J84112 Idiopathic pulmonary fibrosis: Secondary | ICD-10-CM | POA: Diagnosis not present

## 2020-03-16 DIAGNOSIS — M109 Gout, unspecified: Secondary | ICD-10-CM | POA: Diagnosis not present

## 2020-03-16 DIAGNOSIS — K2961 Other gastritis with bleeding: Secondary | ICD-10-CM | POA: Diagnosis not present

## 2020-03-16 DIAGNOSIS — I120 Hypertensive chronic kidney disease with stage 5 chronic kidney disease or end stage renal disease: Secondary | ICD-10-CM

## 2020-03-16 MED ORDER — PREDNISONE 10 MG PO TABS: 10.0000 mg | ORAL_TABLET | Freq: Two times a day (BID) | ORAL | 0 refills | Status: DC | PRN

## 2020-03-16 MED ORDER — DICLOFENAC SODIUM 1 % EX GEL: 4.0000 g | Freq: Four times a day (QID) | CUTANEOUS | 1 refills | Status: AC

## 2020-03-16 MED ORDER — PRAVASTATIN SODIUM 20 MG PO TABS: 20.0000 mg | ORAL_TABLET | Freq: Every day | ORAL | 1 refills | Status: DC

## 2020-03-16 MED ORDER — DICLOFENAC 1 % TOPICAL GEL
Freq: Four times a day (QID) | TOPICAL | 0 refills | 13 days | Status: CP
Start: 2020-03-16 — End: 2021-03-16

## 2020-03-16 NOTE — Progress Notes (Signed)
Name: Louis [name]   MRN: [id number]    DOB: 04/14/1960   Date:03/16/2020       Progress Note  Subjective  Chief Complaint  Arthritis   HPI  SOB/Centrilobular emphysema, Insterstitial pneumonitis: he is now seeing Dr. Chase Caller he was referred to see Dr. Cena Benton rehab at [facility] after cardiac cath was negative for blockage. He continues to have SOB with minimal activity and is very frustrated. He also has occasional cough. He has not been using his Symbicort or Spiriva . Explained importance of regular follow ups  ESRD: he is currently going to [facility] , he was on transplant list but states no longer a candidate due to CHF. He has HD three days a week.   HIV: goes to [facility]  clinic on [address], he states keeps his follows up, takes medication daily as prescribed, unable to see notes but he states his titers are good. No weight loss, no thrush, no new rashes. Stable appetite. Reviewed medications   HTN in a patient on ESRD: he is now off all bp medications, he states doing well, bp was elevated when he arrived but improved with rest.   Gout: he takes prednisone prn for out, it can be on knees, wrist of ankles. He needs refill of medication, currently not problems but out of medication  CHF and atherosclerosis of aorta: seeing cardiologist, patient was on beta-blocker and spironolactone but currently off al medications. He also stopped statin therapy but willing to resume that today. He denies lower extremity edema or orthopnea at this time  Patient Active Problem List   Diagnosis Date Noted  . Controlled gout 03/16/2020  . Centrilobular emphysema ([place]) 03/16/2020  . Atherosclerosis of aorta ([place]) 03/16/2020  . UIP (usual interstitial pneumonitis) ([place]) 03/16/2020  . Renal disorder 03/15/2020  . Hyperkalemia 09/04/2019  . Other gastritis with bleeding 08/20/2019  . Chronic systolic heart failure ([place]) 08/19/2019  . Other disorders of phosphorus metabolism 05/05/2019  .  Anemia in chronic kidney disease 04/30/2019  . Coagulation defect, unspecified ([place]) 04/22/2019  . Iron deficiency anemia, unspecified 04/21/2019  . Prolonged Q-T interval on ECG 06/15/2018  . Mild protein-calorie malnutrition ([place]) 05/01/2018  . Eczema 01/04/2018  . History of anemia due to CKD 05/24/2017  . ESRD on dialysis ([place]) 12/19/2016  . 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 Baker's cyst of knee, right 05/17/2016  . Neck muscle spasm 03/16/2016  . Benign hypertension with ESRD (end-stage renal disease) ([place]) 06/23/2010  . Rosacea 06/23/2010  . Human immunodeficiency virus (HIV) disease ([place]) 01/02/1996    Past Surgical History:  Procedure Laterality Date  . CAPD INSERTION N/A 12/28/2016   Procedure: LAPAROSCOPIC INSERTION CONTINUOUS AMBULATORY PERITONEAL DIALYSIS  (CAPD) CATHETER;  Surgeon: Algernon Huxley, MD;  Location: [facility] ORS;  Service: Vascular;  Laterality: N/A;  . DIALYSIS/PERMA CATHETER INSERTION N/A 10/09/2016   Procedure: DIALYSIS/PERMA CATHETER INSERTION;  Surgeon: Algernon Huxley, MD;  Location: [place] [facility];  Service: Cardiovascular;  Laterality: N/A;  . DIALYSIS/PERMA CATHETER REMOVAL N/A 02/19/2017   Procedure: DIALYSIS/PERMA CATHETER REMOVAL;  Surgeon: Algernon Huxley, MD;  Location: [place] [facility];  Service: Cardiovascular;  Laterality: N/A;  . HERNIA REPAIR Bilateral    inguinal    Family History  Problem Relation Age of Onset  . Bradycardia Maternal Grandmother        with pacemaker    Social History   Tobacco Use  . Smoking status: Never Smoker  . Smokeless tobacco: Never  Used  Substance Use Topics  . Alcohol use: Not Currently    Alcohol/week: 2.0 standard drinks    Types: 2 Standard drinks or equivalent Louis week    Comment: has not had any alcohol in a while     Current Outpatient Medications:  .  AURYXIA 1 GM 210 MG(Fe) tablet, Take 1 tablet by mouth 4 (four) times daily -  before meals and at bedtime., Disp: , Rfl:  .   B Complex-C-Folic Acid (RENA-VITE RX) 1 MG TABS, Take 1 tablet by mouth daily., Disp: , Rfl:  .  DESCOVY 200-25 MG tablet, Take 1 tablet by mouth daily., Disp: , Rfl:  .  diclofenac Sodium (VOLTAREN) 1 % GEL, Apply 4 g topically 4 (four) times daily., Disp: 100 g, Rfl: 1 .  methocarbamol (ROBAXIN) 500 MG tablet, TAKE 1 TABLET (500 MG TOTAL) BY MOUTH 2 (TWO) TIMES DAILY AS NEEDED. FOR PAIN, Disp: 60 tablet, Rfl: 0 .  TIVICAY 50 MG tablet, Take 1 tablet by mouth daily., Disp: , Rfl:  .  valACYclovir (VALTREX) 500 MG tablet, Take 500 mg by mouth daily as needed (for flare ups). , Disp: , Rfl:  .  budesonide-formoterol (SYMBICORT) 80-4.5 MCG/ACT inhaler, Inhale 2 puffs into the lungs 2 (two) times daily. (Patient not taking: No sig reported), Disp: 1 Inhaler, Rfl: 12 .  calcitRIOL (ROCALTROL) 0.25 MCG capsule, Take by mouth. (Patient not taking: No sig reported), Disp: , Rfl:  .  ondansetron (ZOFRAN-ODT) 4 MG disintegrating tablet, Take 4 mg by mouth every 8 (eight) hours as needed. (Patient not taking: Reported on 03/16/2020), Disp: , Rfl:  .  pravastatin (PRAVACHOL) 20 MG tablet, Take 1 tablet (20 mg total) by mouth daily., Disp: 90 tablet, Rfl: 1 .  predniSONE (DELTASONE) 10 MG tablet, Take 1 tablet (10 mg total) by mouth 2 (two) times daily as needed. Prn gout attack for 2 days, Disp: 10 tablet, Rfl: 0 .  Tiotropium Bromide Monohydrate (SPIRIVA RESPIMAT) 1.25 MCG/ACT AERS, INHALE 2 PUFFS BY MOUTH INTO THE LUNGS DAILY (Patient not taking: No sig reported), Disp: 4 g, Rfl: 3  Allergies  Allergen Reactions  . Losartan Swelling    Lip swelling  . Quinapril Hcl Swelling    Face swells  . Spironolactone Other (See Comments)    Other reaction(s): hallucinations    I personally reviewed active problem list, medication list, allergies, family history, social history, health maintenance with the patient/caregiver today.   ROS  Constitutional: Negative for fever or weight change.  Respiratory:  positive for intermittent  cough and shortness of breath.   Cardiovascular: Negative for chest pain or palpitations.  Gastrointestinal: Negative for abdominal pain, no bowel changes.  Musculoskeletal: Negative for gait problem , positive for intermittent pain and  joint swelling.  Skin: Negative for rash.  Neurological: Negative for dizziness or headache.  No other specific complaints in a complete review of systems (except as listed in HPI above).  Objective  Vitals:   03/16/20 1131  BP: 140/90  Pulse: 96  Resp: 16  Temp: 98.1 F (36.7 C)  TempSrc: Oral  SpO2: 98%  Weight: 197 lb 6.4 oz (89.5 kg)  Height: '6\' 2"'$  (1.88 m)    Body mass index is 25.34 kg/m.  Physical Exam  Constitutional: Patient appears well-developed and well-nourished. No distress.  HEENT: head atraumatic, normocephalic, pupils equal and reactive to light, neck supple Cardiovascular: Normal rate, regular rhythm and normal heart sounds.  No murmur heard. No BLE edema. Pulmonary/Chest: Effort  normal and breath sounds scattered rhonchi No respiratory distress. Abdominal: Soft.  There is no tenderness. Psychiatric: Patient has a normal mood and affect. behavior is normal. Judgment and thought content normal.  PHQ2/9: Depression screen [facility] 2/9 03/16/2020 03/15/2020 11/20/2019 10/02/2019 08/08/2019  Decreased Interest 0 0 3 0 0  Down, Depressed, Hopeless 0 0 0 0 0  PHQ - 2 Score 0 0 3 0 0  Altered sleeping 0 0 0 - 0  Tired, decreased energy '3 3 2 '$ - 0  Change in appetite 2 2 0 - 0  Feeling bad or failure about yourself  0 0 0 - 0  Trouble concentrating 0 0 0 - 0  Moving slowly or fidgety/restless 1 1 0 - 0  Suicidal thoughts 0 0 0 - 0  PHQ-9 Score '6 6 5 '$ - 0  Difficult doing work/chores - Not difficult at all Not difficult at all - -  Some recent data might be hidden    phq 9 is negative   Fall Risk: Fall Risk  03/16/2020 03/15/2020 11/18/2019 10/02/2019 08/08/2019  Falls in the past year? 0 0 0 0 0  Number falls  in past yr: 0 0 0 0 0  Injury with Fall? 0 0 0 0 0  Comment - - - - -  Risk for fall due to : - - No Fall Risks No Fall Risks -  Follow up - - Falls evaluation completed;Falls prevention discussed;Education provided Falls prevention discussed -      Assessment & Plan  1. Atherosclerosis of aorta ([place])  Advised to resume statin therapy, and return for labs and follow up in 6 months. He does not want to have it checked today   2. Centrilobular emphysema ([place])  He is not using inhalers given by pulmonologist and did not have the appointment with pulmonary rehab, reviewed studies and notes with him, advised to follow medical advise to see if his sob will improve  3. UIP (usual interstitial pneumonitis) ([place])  On CT scan, needs to follow up with pulmonologist   4. Benign hypertension with ESRD (end-stage renal disease) ([place])  Still having HD three times a week and doing well   5. Controlled gout  He has intermittent symptoms, stopped Uloric, discussed rechecking uric acid but he would like to hold off  - predniSONE (DELTASONE) 10 MG tablet; Take 1 tablet (10 mg total) by mouth 2 (two) times daily as needed. Prn gout attack for 2 days  Dispense: 10 tablet; Refill: 0  6. Chronic systolic heart failure ([place])  Seen by cardiologist in [place] and also went to [facility], last Echo showed EF 40-45 %, explained that his SOB is likely multifactorial   7. Essential hypertension  Off bp medication and bp is at goal today   8. Human immunodeficiency virus (HIV) disease ([place])  He goes to [facility], unable to see notes. He states compliant with medications and had a visit this week   9. ESRD on dialysis [facility])

## 2020-03-17 DIAGNOSIS — N186 End stage renal disease: Secondary | ICD-10-CM | POA: Diagnosis not present

## 2020-03-17 DIAGNOSIS — Z992 Dependence on renal dialysis: Secondary | ICD-10-CM | POA: Diagnosis not present

## 2020-03-17 DIAGNOSIS — K2961 Other gastritis with bleeding: Secondary | ICD-10-CM | POA: Diagnosis not present

## 2020-03-17 DIAGNOSIS — E8779 Other fluid overload: Secondary | ICD-10-CM | POA: Diagnosis not present

## 2020-03-18 ENCOUNTER — Ambulatory Visit: Payer: Medicare Other

## 2020-03-19 DIAGNOSIS — E8779 Other fluid overload: Secondary | ICD-10-CM | POA: Diagnosis not present

## 2020-03-19 DIAGNOSIS — K2961 Other gastritis with bleeding: Secondary | ICD-10-CM | POA: Diagnosis not present

## 2020-03-19 DIAGNOSIS — Z992 Dependence on renal dialysis: Secondary | ICD-10-CM | POA: Diagnosis not present

## 2020-03-19 DIAGNOSIS — N186 End stage renal disease: Secondary | ICD-10-CM | POA: Diagnosis not present

## 2020-03-22 DIAGNOSIS — N186 End stage renal disease: Secondary | ICD-10-CM | POA: Diagnosis not present

## 2020-03-22 DIAGNOSIS — K2961 Other gastritis with bleeding: Secondary | ICD-10-CM | POA: Diagnosis not present

## 2020-03-22 DIAGNOSIS — E8779 Other fluid overload: Secondary | ICD-10-CM | POA: Diagnosis not present

## 2020-03-22 DIAGNOSIS — Z992 Dependence on renal dialysis: Secondary | ICD-10-CM | POA: Diagnosis not present

## 2020-03-23 ENCOUNTER — Ambulatory Visit: Payer: Medicare Other

## 2020-03-23 DIAGNOSIS — K2961 Other gastritis with bleeding: Secondary | ICD-10-CM | POA: Diagnosis not present

## 2020-03-23 DIAGNOSIS — Z992 Dependence on renal dialysis: Secondary | ICD-10-CM | POA: Diagnosis not present

## 2020-03-23 DIAGNOSIS — N186 End stage renal disease: Secondary | ICD-10-CM | POA: Diagnosis not present

## 2020-03-23 DIAGNOSIS — E8779 Other fluid overload: Secondary | ICD-10-CM | POA: Diagnosis not present

## 2020-03-24 DIAGNOSIS — N186 End stage renal disease: Secondary | ICD-10-CM | POA: Diagnosis not present

## 2020-03-24 DIAGNOSIS — Z992 Dependence on renal dialysis: Secondary | ICD-10-CM | POA: Diagnosis not present

## 2020-03-24 DIAGNOSIS — E8779 Other fluid overload: Secondary | ICD-10-CM | POA: Diagnosis not present

## 2020-03-24 DIAGNOSIS — K2961 Other gastritis with bleeding: Secondary | ICD-10-CM | POA: Diagnosis not present

## 2020-03-26 DIAGNOSIS — N186 End stage renal disease: Secondary | ICD-10-CM | POA: Diagnosis not present

## 2020-03-26 DIAGNOSIS — Z992 Dependence on renal dialysis: Secondary | ICD-10-CM | POA: Diagnosis not present

## 2020-03-26 DIAGNOSIS — E8779 Other fluid overload: Secondary | ICD-10-CM | POA: Diagnosis not present

## 2020-03-26 DIAGNOSIS — K2961 Other gastritis with bleeding: Secondary | ICD-10-CM | POA: Diagnosis not present

## 2020-03-29 DIAGNOSIS — E8779 Other fluid overload: Secondary | ICD-10-CM | POA: Diagnosis not present

## 2020-03-29 DIAGNOSIS — N186 End stage renal disease: Secondary | ICD-10-CM | POA: Diagnosis not present

## 2020-03-29 DIAGNOSIS — K2961 Other gastritis with bleeding: Secondary | ICD-10-CM | POA: Diagnosis not present

## 2020-03-29 DIAGNOSIS — Z992 Dependence on renal dialysis: Secondary | ICD-10-CM | POA: Diagnosis not present

## 2020-03-30 DIAGNOSIS — Z992 Dependence on renal dialysis: Secondary | ICD-10-CM | POA: Diagnosis not present

## 2020-03-30 DIAGNOSIS — K2961 Other gastritis with bleeding: Secondary | ICD-10-CM | POA: Diagnosis not present

## 2020-03-30 DIAGNOSIS — E8779 Other fluid overload: Secondary | ICD-10-CM | POA: Diagnosis not present

## 2020-03-30 DIAGNOSIS — N186 End stage renal disease: Secondary | ICD-10-CM | POA: Diagnosis not present

## 2020-03-31 DIAGNOSIS — N186 End stage renal disease: Secondary | ICD-10-CM | POA: Diagnosis not present

## 2020-03-31 DIAGNOSIS — E8779 Other fluid overload: Secondary | ICD-10-CM | POA: Diagnosis not present

## 2020-03-31 DIAGNOSIS — Z992 Dependence on renal dialysis: Secondary | ICD-10-CM | POA: Diagnosis not present

## 2020-03-31 DIAGNOSIS — K2961 Other gastritis with bleeding: Secondary | ICD-10-CM | POA: Diagnosis not present

## 2020-04-02 ENCOUNTER — Encounter: Admit: 2020-04-02 | Discharge: 2020-04-03 | Payer: MEDICARE

## 2020-04-02 DIAGNOSIS — D631 Anemia in chronic kidney disease: Secondary | ICD-10-CM | POA: Diagnosis not present

## 2020-04-02 DIAGNOSIS — M109 Gout, unspecified: Secondary | ICD-10-CM | POA: Diagnosis not present

## 2020-04-02 DIAGNOSIS — G4734 Idiopathic sleep related nonobstructive alveolar hypoventilation: Secondary | ICD-10-CM | POA: Diagnosis not present

## 2020-04-02 DIAGNOSIS — I132 Hypertensive heart and chronic kidney disease with heart failure and with stage 5 chronic kidney disease, or end stage renal disease: Secondary | ICD-10-CM | POA: Diagnosis not present

## 2020-04-02 DIAGNOSIS — Z7952 Long term (current) use of systemic steroids: Secondary | ICD-10-CM | POA: Diagnosis not present

## 2020-04-02 DIAGNOSIS — I10 Essential (primary) hypertension: Secondary | ICD-10-CM | POA: Diagnosis not present

## 2020-04-02 DIAGNOSIS — J449 Chronic obstructive pulmonary disease, unspecified: Secondary | ICD-10-CM | POA: Diagnosis not present

## 2020-04-02 DIAGNOSIS — K2961 Other gastritis with bleeding: Secondary | ICD-10-CM | POA: Diagnosis not present

## 2020-04-02 DIAGNOSIS — Z79899 Other long term (current) drug therapy: Secondary | ICD-10-CM | POA: Diagnosis not present

## 2020-04-02 DIAGNOSIS — N186 End stage renal disease: Secondary | ICD-10-CM | POA: Diagnosis not present

## 2020-04-02 DIAGNOSIS — I5020 Unspecified systolic (congestive) heart failure: Secondary | ICD-10-CM | POA: Diagnosis not present

## 2020-04-02 DIAGNOSIS — R0600 Dyspnea, unspecified: Secondary | ICD-10-CM | POA: Diagnosis not present

## 2020-04-02 DIAGNOSIS — E8779 Other fluid overload: Secondary | ICD-10-CM | POA: Diagnosis not present

## 2020-04-02 DIAGNOSIS — R0602 Shortness of breath: Secondary | ICD-10-CM | POA: Diagnosis not present

## 2020-04-02 DIAGNOSIS — Z992 Dependence on renal dialysis: Secondary | ICD-10-CM | POA: Diagnosis not present

## 2020-04-02 MED ORDER — METOPROLOL TARTRATE 25 MG TABLET
ORAL_TABLET | Freq: Two times a day (BID) | ORAL | 11 refills | 30.00000 days | Status: CP
Start: 2020-04-02 — End: 2021-04-02

## 2020-04-05 DIAGNOSIS — N186 End stage renal disease: Secondary | ICD-10-CM | POA: Diagnosis not present

## 2020-04-05 DIAGNOSIS — Z992 Dependence on renal dialysis: Secondary | ICD-10-CM | POA: Diagnosis not present

## 2020-04-05 DIAGNOSIS — E8779 Other fluid overload: Secondary | ICD-10-CM | POA: Diagnosis not present

## 2020-04-05 DIAGNOSIS — K2961 Other gastritis with bleeding: Secondary | ICD-10-CM | POA: Diagnosis not present

## 2020-04-06 DIAGNOSIS — N186 End stage renal disease: Secondary | ICD-10-CM | POA: Diagnosis not present

## 2020-04-06 DIAGNOSIS — Z992 Dependence on renal dialysis: Secondary | ICD-10-CM | POA: Diagnosis not present

## 2020-04-06 DIAGNOSIS — E8779 Other fluid overload: Secondary | ICD-10-CM | POA: Diagnosis not present

## 2020-04-06 DIAGNOSIS — K2961 Other gastritis with bleeding: Secondary | ICD-10-CM | POA: Diagnosis not present

## 2020-04-07 DIAGNOSIS — E8779 Other fluid overload: Secondary | ICD-10-CM | POA: Diagnosis not present

## 2020-04-07 DIAGNOSIS — N186 End stage renal disease: Secondary | ICD-10-CM | POA: Diagnosis not present

## 2020-04-07 DIAGNOSIS — K2961 Other gastritis with bleeding: Secondary | ICD-10-CM | POA: Diagnosis not present

## 2020-04-07 DIAGNOSIS — Z992 Dependence on renal dialysis: Secondary | ICD-10-CM | POA: Diagnosis not present

## 2020-04-09 DIAGNOSIS — E8779 Other fluid overload: Secondary | ICD-10-CM | POA: Diagnosis not present

## 2020-04-09 DIAGNOSIS — N186 End stage renal disease: Secondary | ICD-10-CM | POA: Diagnosis not present

## 2020-04-09 DIAGNOSIS — K2961 Other gastritis with bleeding: Secondary | ICD-10-CM | POA: Diagnosis not present

## 2020-04-09 DIAGNOSIS — Z992 Dependence on renal dialysis: Secondary | ICD-10-CM | POA: Diagnosis not present

## 2020-04-12 DIAGNOSIS — K2961 Other gastritis with bleeding: Secondary | ICD-10-CM | POA: Diagnosis not present

## 2020-04-12 DIAGNOSIS — E8779 Other fluid overload: Secondary | ICD-10-CM | POA: Diagnosis not present

## 2020-04-12 DIAGNOSIS — Z992 Dependence on renal dialysis: Secondary | ICD-10-CM | POA: Diagnosis not present

## 2020-04-12 DIAGNOSIS — N186 End stage renal disease: Secondary | ICD-10-CM | POA: Diagnosis not present

## 2020-04-13 DIAGNOSIS — E8779 Other fluid overload: Secondary | ICD-10-CM | POA: Diagnosis not present

## 2020-04-13 DIAGNOSIS — N186 End stage renal disease: Secondary | ICD-10-CM | POA: Diagnosis not present

## 2020-04-13 DIAGNOSIS — Z992 Dependence on renal dialysis: Secondary | ICD-10-CM | POA: Diagnosis not present

## 2020-04-13 DIAGNOSIS — D509 Iron deficiency anemia, unspecified: Secondary | ICD-10-CM | POA: Diagnosis not present

## 2020-04-13 DIAGNOSIS — D631 Anemia in chronic kidney disease: Secondary | ICD-10-CM | POA: Diagnosis not present

## 2020-04-14 DIAGNOSIS — D509 Iron deficiency anemia, unspecified: Secondary | ICD-10-CM | POA: Diagnosis not present

## 2020-04-14 DIAGNOSIS — N186 End stage renal disease: Secondary | ICD-10-CM | POA: Diagnosis not present

## 2020-04-14 DIAGNOSIS — Z992 Dependence on renal dialysis: Secondary | ICD-10-CM | POA: Diagnosis not present

## 2020-04-14 DIAGNOSIS — E8779 Other fluid overload: Secondary | ICD-10-CM | POA: Diagnosis not present

## 2020-04-14 DIAGNOSIS — D631 Anemia in chronic kidney disease: Secondary | ICD-10-CM | POA: Diagnosis not present

## 2020-04-16 DIAGNOSIS — N186 End stage renal disease: Secondary | ICD-10-CM | POA: Diagnosis not present

## 2020-04-16 DIAGNOSIS — D631 Anemia in chronic kidney disease: Secondary | ICD-10-CM | POA: Diagnosis not present

## 2020-04-16 DIAGNOSIS — Z992 Dependence on renal dialysis: Secondary | ICD-10-CM | POA: Diagnosis not present

## 2020-04-16 DIAGNOSIS — E8779 Other fluid overload: Secondary | ICD-10-CM | POA: Diagnosis not present

## 2020-04-16 DIAGNOSIS — D509 Iron deficiency anemia, unspecified: Secondary | ICD-10-CM | POA: Diagnosis not present

## 2020-04-19 DIAGNOSIS — D631 Anemia in chronic kidney disease: Secondary | ICD-10-CM | POA: Diagnosis not present

## 2020-04-19 DIAGNOSIS — D509 Iron deficiency anemia, unspecified: Secondary | ICD-10-CM | POA: Diagnosis not present

## 2020-04-19 DIAGNOSIS — E8779 Other fluid overload: Secondary | ICD-10-CM | POA: Diagnosis not present

## 2020-04-19 DIAGNOSIS — Z992 Dependence on renal dialysis: Secondary | ICD-10-CM | POA: Diagnosis not present

## 2020-04-19 DIAGNOSIS — N186 End stage renal disease: Secondary | ICD-10-CM | POA: Diagnosis not present

## 2020-04-20 DIAGNOSIS — E8779 Other fluid overload: Secondary | ICD-10-CM | POA: Diagnosis not present

## 2020-04-20 DIAGNOSIS — D631 Anemia in chronic kidney disease: Secondary | ICD-10-CM | POA: Diagnosis not present

## 2020-04-20 DIAGNOSIS — Z992 Dependence on renal dialysis: Secondary | ICD-10-CM | POA: Diagnosis not present

## 2020-04-20 DIAGNOSIS — D509 Iron deficiency anemia, unspecified: Secondary | ICD-10-CM | POA: Diagnosis not present

## 2020-04-20 DIAGNOSIS — N186 End stage renal disease: Secondary | ICD-10-CM | POA: Diagnosis not present

## 2020-04-21 DIAGNOSIS — N186 End stage renal disease: Secondary | ICD-10-CM | POA: Diagnosis not present

## 2020-04-21 DIAGNOSIS — Z992 Dependence on renal dialysis: Secondary | ICD-10-CM | POA: Diagnosis not present

## 2020-04-21 DIAGNOSIS — D631 Anemia in chronic kidney disease: Secondary | ICD-10-CM | POA: Diagnosis not present

## 2020-04-21 DIAGNOSIS — D509 Iron deficiency anemia, unspecified: Secondary | ICD-10-CM | POA: Diagnosis not present

## 2020-04-21 DIAGNOSIS — E8779 Other fluid overload: Secondary | ICD-10-CM | POA: Diagnosis not present

## 2020-04-23 DIAGNOSIS — E8779 Other fluid overload: Secondary | ICD-10-CM | POA: Diagnosis not present

## 2020-04-23 DIAGNOSIS — D631 Anemia in chronic kidney disease: Secondary | ICD-10-CM | POA: Diagnosis not present

## 2020-04-23 DIAGNOSIS — N186 End stage renal disease: Secondary | ICD-10-CM | POA: Diagnosis not present

## 2020-04-23 DIAGNOSIS — D509 Iron deficiency anemia, unspecified: Secondary | ICD-10-CM | POA: Diagnosis not present

## 2020-04-23 DIAGNOSIS — Z992 Dependence on renal dialysis: Secondary | ICD-10-CM | POA: Diagnosis not present

## 2020-04-26 DIAGNOSIS — N186 End stage renal disease: Secondary | ICD-10-CM | POA: Diagnosis not present

## 2020-04-26 DIAGNOSIS — D631 Anemia in chronic kidney disease: Secondary | ICD-10-CM | POA: Diagnosis not present

## 2020-04-26 DIAGNOSIS — E8779 Other fluid overload: Secondary | ICD-10-CM | POA: Diagnosis not present

## 2020-04-26 DIAGNOSIS — Z992 Dependence on renal dialysis: Secondary | ICD-10-CM | POA: Diagnosis not present

## 2020-04-26 DIAGNOSIS — D509 Iron deficiency anemia, unspecified: Secondary | ICD-10-CM | POA: Diagnosis not present

## 2020-04-27 DIAGNOSIS — Z992 Dependence on renal dialysis: Secondary | ICD-10-CM | POA: Diagnosis not present

## 2020-04-27 DIAGNOSIS — D509 Iron deficiency anemia, unspecified: Secondary | ICD-10-CM | POA: Diagnosis not present

## 2020-04-27 DIAGNOSIS — N186 End stage renal disease: Secondary | ICD-10-CM | POA: Diagnosis not present

## 2020-04-27 DIAGNOSIS — E8779 Other fluid overload: Secondary | ICD-10-CM | POA: Diagnosis not present

## 2020-04-27 DIAGNOSIS — D631 Anemia in chronic kidney disease: Secondary | ICD-10-CM | POA: Diagnosis not present

## 2020-04-28 DIAGNOSIS — D631 Anemia in chronic kidney disease: Secondary | ICD-10-CM | POA: Diagnosis not present

## 2020-04-28 DIAGNOSIS — N186 End stage renal disease: Secondary | ICD-10-CM | POA: Diagnosis not present

## 2020-04-28 DIAGNOSIS — D509 Iron deficiency anemia, unspecified: Secondary | ICD-10-CM | POA: Diagnosis not present

## 2020-04-28 DIAGNOSIS — Z992 Dependence on renal dialysis: Secondary | ICD-10-CM | POA: Diagnosis not present

## 2020-04-28 DIAGNOSIS — E8779 Other fluid overload: Secondary | ICD-10-CM | POA: Diagnosis not present

## 2020-04-28 DIAGNOSIS — R059 Cough: Principal | ICD-10-CM

## 2020-04-28 DIAGNOSIS — R0602 Shortness of breath: Principal | ICD-10-CM

## 2020-04-29 ENCOUNTER — Encounter: Admit: 2020-04-29 | Discharge: 2020-04-30 | Payer: MEDICARE

## 2020-04-29 DIAGNOSIS — R0602 Shortness of breath: Secondary | ICD-10-CM | POA: Diagnosis not present

## 2020-04-29 DIAGNOSIS — R059 Cough, unspecified: Secondary | ICD-10-CM | POA: Diagnosis not present

## 2020-04-30 DIAGNOSIS — D509 Iron deficiency anemia, unspecified: Secondary | ICD-10-CM | POA: Diagnosis not present

## 2020-04-30 DIAGNOSIS — Z992 Dependence on renal dialysis: Secondary | ICD-10-CM | POA: Diagnosis not present

## 2020-04-30 DIAGNOSIS — N186 End stage renal disease: Secondary | ICD-10-CM | POA: Diagnosis not present

## 2020-04-30 DIAGNOSIS — D631 Anemia in chronic kidney disease: Secondary | ICD-10-CM | POA: Diagnosis not present

## 2020-04-30 DIAGNOSIS — E8779 Other fluid overload: Secondary | ICD-10-CM | POA: Diagnosis not present

## 2020-04-30 DIAGNOSIS — R059 Cough: Principal | ICD-10-CM

## 2020-04-30 DIAGNOSIS — R058 Productive cough: Principal | ICD-10-CM

## 2020-05-03 DIAGNOSIS — N186 End stage renal disease: Secondary | ICD-10-CM | POA: Diagnosis not present

## 2020-05-03 DIAGNOSIS — D631 Anemia in chronic kidney disease: Secondary | ICD-10-CM | POA: Diagnosis not present

## 2020-05-03 DIAGNOSIS — D509 Iron deficiency anemia, unspecified: Secondary | ICD-10-CM | POA: Diagnosis not present

## 2020-05-03 DIAGNOSIS — Z992 Dependence on renal dialysis: Secondary | ICD-10-CM | POA: Diagnosis not present

## 2020-05-03 DIAGNOSIS — E8779 Other fluid overload: Secondary | ICD-10-CM | POA: Diagnosis not present

## 2020-05-04 ENCOUNTER — Encounter: Admit: 2020-05-04 | Discharge: 2020-05-05 | Payer: MEDICARE

## 2020-05-04 DIAGNOSIS — E8779 Other fluid overload: Secondary | ICD-10-CM | POA: Diagnosis not present

## 2020-05-04 DIAGNOSIS — D509 Iron deficiency anemia, unspecified: Secondary | ICD-10-CM | POA: Diagnosis not present

## 2020-05-04 DIAGNOSIS — D631 Anemia in chronic kidney disease: Secondary | ICD-10-CM | POA: Diagnosis not present

## 2020-05-04 DIAGNOSIS — Z992 Dependence on renal dialysis: Secondary | ICD-10-CM | POA: Diagnosis not present

## 2020-05-04 DIAGNOSIS — R058 Other specified cough: Secondary | ICD-10-CM | POA: Diagnosis not present

## 2020-05-04 DIAGNOSIS — N186 End stage renal disease: Secondary | ICD-10-CM | POA: Diagnosis not present

## 2020-05-05 ENCOUNTER — Ambulatory Visit: Admit: 2020-05-05 | Discharge: 2020-05-07 | Payer: MEDICARE

## 2020-05-05 DIAGNOSIS — D509 Iron deficiency anemia, unspecified: Secondary | ICD-10-CM | POA: Diagnosis not present

## 2020-05-05 DIAGNOSIS — D631 Anemia in chronic kidney disease: Secondary | ICD-10-CM | POA: Diagnosis not present

## 2020-05-05 DIAGNOSIS — G4731 Primary central sleep apnea: Secondary | ICD-10-CM | POA: Diagnosis not present

## 2020-05-05 DIAGNOSIS — G4733 Obstructive sleep apnea (adult) (pediatric): Secondary | ICD-10-CM | POA: Diagnosis not present

## 2020-05-05 DIAGNOSIS — R0600 Dyspnea, unspecified: Secondary | ICD-10-CM | POA: Diagnosis not present

## 2020-05-05 DIAGNOSIS — N186 End stage renal disease: Secondary | ICD-10-CM | POA: Diagnosis not present

## 2020-05-05 DIAGNOSIS — I272 Pulmonary hypertension, unspecified: Secondary | ICD-10-CM | POA: Diagnosis not present

## 2020-05-05 DIAGNOSIS — I5022 Chronic systolic (congestive) heart failure: Secondary | ICD-10-CM | POA: Diagnosis not present

## 2020-05-05 DIAGNOSIS — I132 Hypertensive heart and chronic kidney disease with heart failure and with stage 5 chronic kidney disease, or end stage renal disease: Secondary | ICD-10-CM | POA: Diagnosis not present

## 2020-05-05 DIAGNOSIS — Z992 Dependence on renal dialysis: Secondary | ICD-10-CM | POA: Diagnosis not present

## 2020-05-05 DIAGNOSIS — E8779 Other fluid overload: Secondary | ICD-10-CM | POA: Diagnosis not present

## 2020-05-05 DIAGNOSIS — J449 Chronic obstructive pulmonary disease, unspecified: Secondary | ICD-10-CM | POA: Diagnosis not present

## 2020-05-06 DIAGNOSIS — R069 Unspecified abnormalities of breathing: Principal | ICD-10-CM

## 2020-05-07 DIAGNOSIS — D631 Anemia in chronic kidney disease: Secondary | ICD-10-CM | POA: Diagnosis not present

## 2020-05-07 DIAGNOSIS — Z992 Dependence on renal dialysis: Secondary | ICD-10-CM | POA: Diagnosis not present

## 2020-05-07 DIAGNOSIS — E8779 Other fluid overload: Secondary | ICD-10-CM | POA: Diagnosis not present

## 2020-05-07 DIAGNOSIS — N186 End stage renal disease: Secondary | ICD-10-CM | POA: Diagnosis not present

## 2020-05-07 DIAGNOSIS — D509 Iron deficiency anemia, unspecified: Secondary | ICD-10-CM | POA: Diagnosis not present

## 2020-05-10 DIAGNOSIS — E8779 Other fluid overload: Secondary | ICD-10-CM | POA: Diagnosis not present

## 2020-05-10 DIAGNOSIS — D509 Iron deficiency anemia, unspecified: Secondary | ICD-10-CM | POA: Diagnosis not present

## 2020-05-10 DIAGNOSIS — N186 End stage renal disease: Secondary | ICD-10-CM | POA: Diagnosis not present

## 2020-05-10 DIAGNOSIS — Z992 Dependence on renal dialysis: Secondary | ICD-10-CM | POA: Diagnosis not present

## 2020-05-10 DIAGNOSIS — D631 Anemia in chronic kidney disease: Secondary | ICD-10-CM | POA: Diagnosis not present

## 2020-05-11 DIAGNOSIS — Z992 Dependence on renal dialysis: Secondary | ICD-10-CM | POA: Diagnosis not present

## 2020-05-11 DIAGNOSIS — E8779 Other fluid overload: Secondary | ICD-10-CM | POA: Diagnosis not present

## 2020-05-11 DIAGNOSIS — D509 Iron deficiency anemia, unspecified: Secondary | ICD-10-CM | POA: Diagnosis not present

## 2020-05-11 DIAGNOSIS — D631 Anemia in chronic kidney disease: Secondary | ICD-10-CM | POA: Diagnosis not present

## 2020-05-11 DIAGNOSIS — N186 End stage renal disease: Secondary | ICD-10-CM | POA: Diagnosis not present

## 2020-05-12 DIAGNOSIS — D631 Anemia in chronic kidney disease: Secondary | ICD-10-CM | POA: Diagnosis not present

## 2020-05-12 DIAGNOSIS — Z992 Dependence on renal dialysis: Secondary | ICD-10-CM | POA: Diagnosis not present

## 2020-05-12 DIAGNOSIS — E8779 Other fluid overload: Secondary | ICD-10-CM | POA: Diagnosis not present

## 2020-05-12 DIAGNOSIS — D509 Iron deficiency anemia, unspecified: Secondary | ICD-10-CM | POA: Diagnosis not present

## 2020-05-12 DIAGNOSIS — N186 End stage renal disease: Secondary | ICD-10-CM | POA: Diagnosis not present

## 2020-05-13 DIAGNOSIS — Z992 Dependence on renal dialysis: Secondary | ICD-10-CM | POA: Diagnosis not present

## 2020-05-13 DIAGNOSIS — N186 End stage renal disease: Secondary | ICD-10-CM | POA: Diagnosis not present

## 2020-05-14 DIAGNOSIS — N186 End stage renal disease: Secondary | ICD-10-CM | POA: Diagnosis not present

## 2020-05-14 DIAGNOSIS — E8779 Other fluid overload: Secondary | ICD-10-CM | POA: Diagnosis not present

## 2020-05-14 DIAGNOSIS — Z992 Dependence on renal dialysis: Secondary | ICD-10-CM | POA: Diagnosis not present

## 2020-05-14 DIAGNOSIS — D509 Iron deficiency anemia, unspecified: Secondary | ICD-10-CM | POA: Diagnosis not present

## 2020-05-14 IMAGING — CT CT CHEST HIGH RESOLUTION W/O CM
2 of 8 series · 14 of 36 positions shown, 17 images · non-contrast
Comparison: No priors.

CLINICAL DATA: 58-year-old male with shortness of breath. Evaluate
for interstitial lung disease.

EXAM:
CT CHEST WITHOUT CONTRAST
TECHNIQUE: Multidetector CT imaging of the chest was performed following the
standard protocol without intravenous contrast. High resolution
imaging of the lungs, as well as inspiratory and expiratory imaging,
was performed.

[Series 5: thorax 2.00 cor · coronal · 0.63mm/px · 3 of 167 slices shown]
[im 34/167  lung]
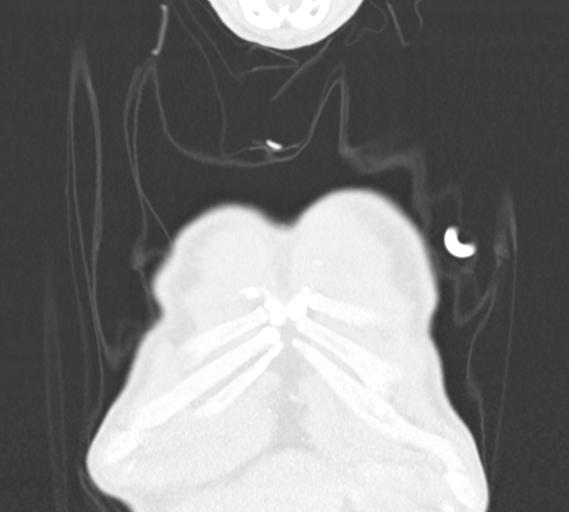
[im 67/167  lung]
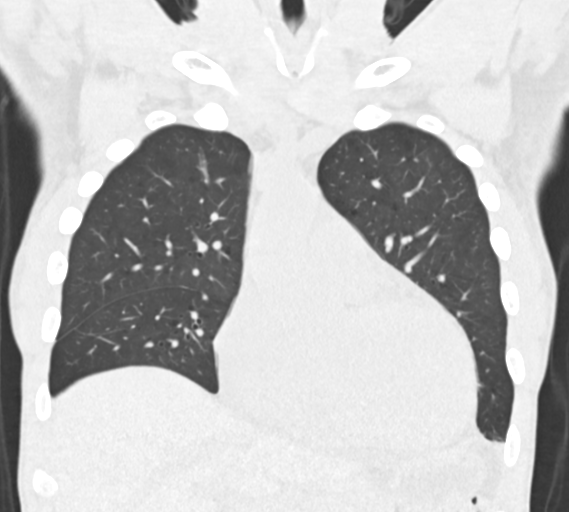
[im 100/167  lung]
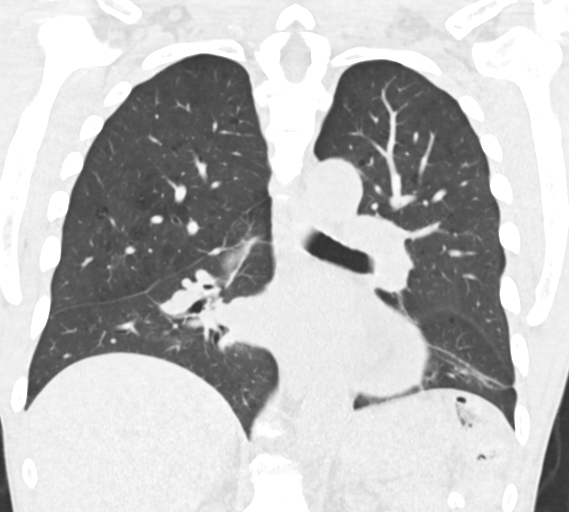

[Series 11: high res (id) thorax 1.00 ax · axial · 0.67mm/px · z∈[-1159,-893]mm · 11 of 320 slices shown, 14 images]
[im 27/320  mediastinal]
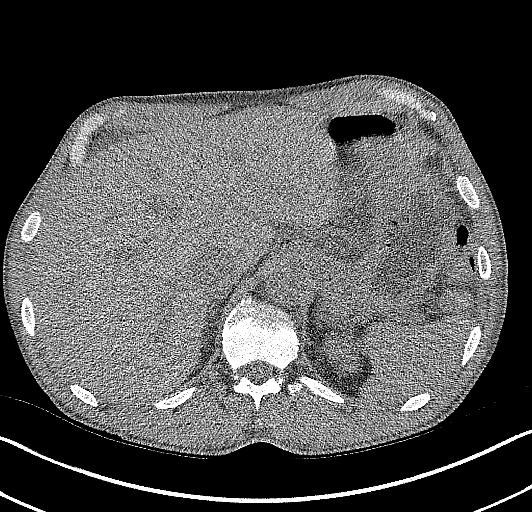
[im 27/320  lung]
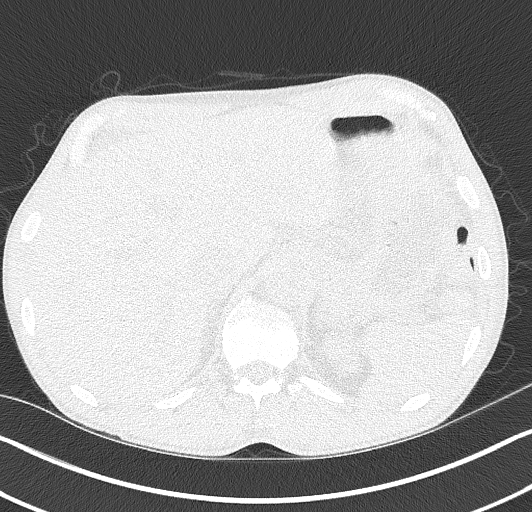
[im 54/320  lung]
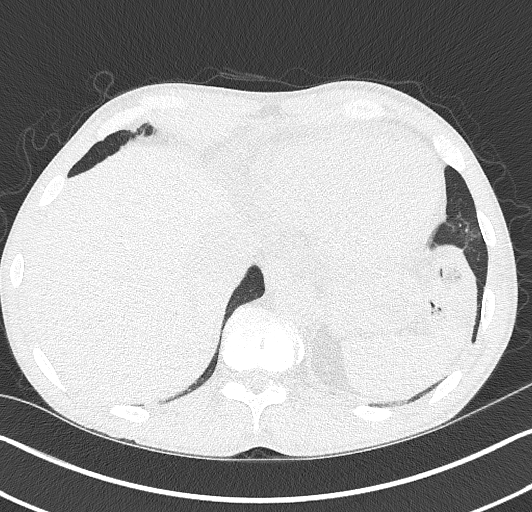
[im 80/320  lung]
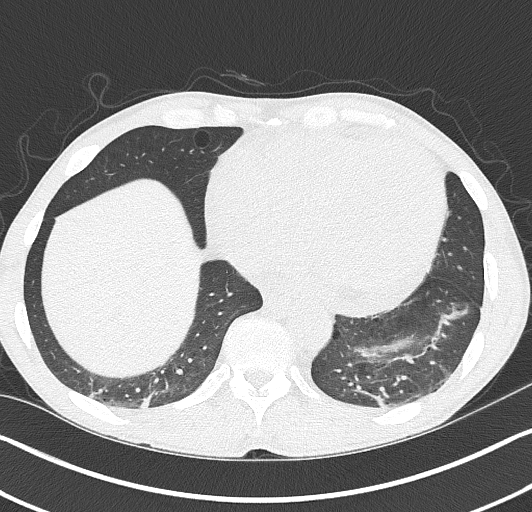
[im 107/320  lung]
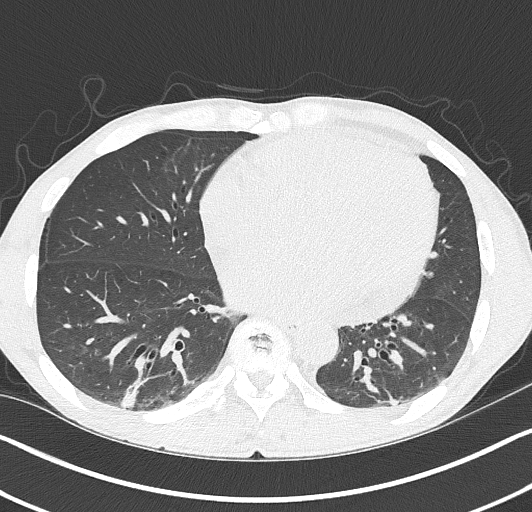
[im 133/320  mediastinal]
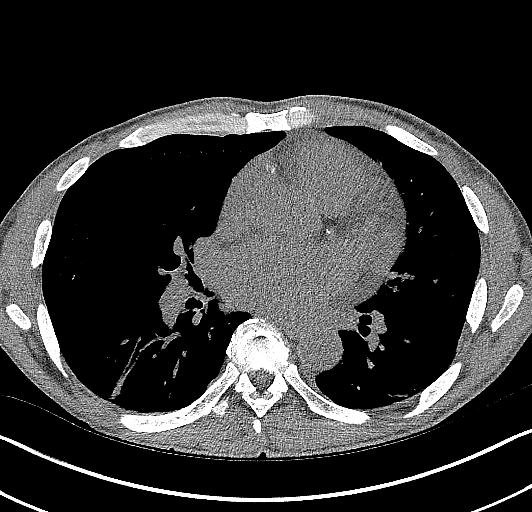
[im 133/320  lung]
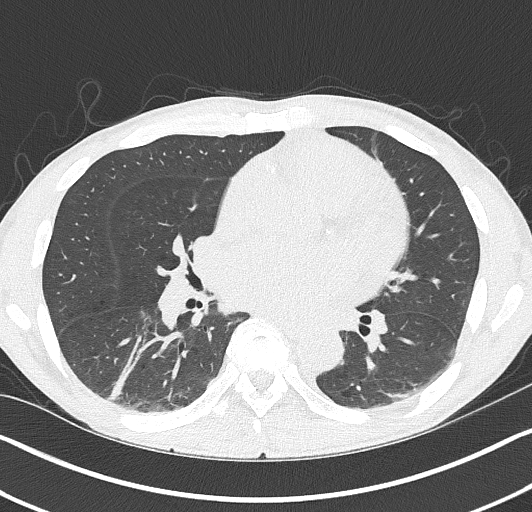
[im 160/320  lung]
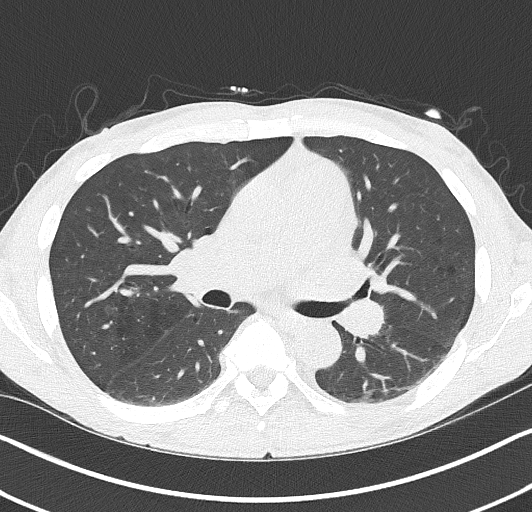
[im 187/320  lung]
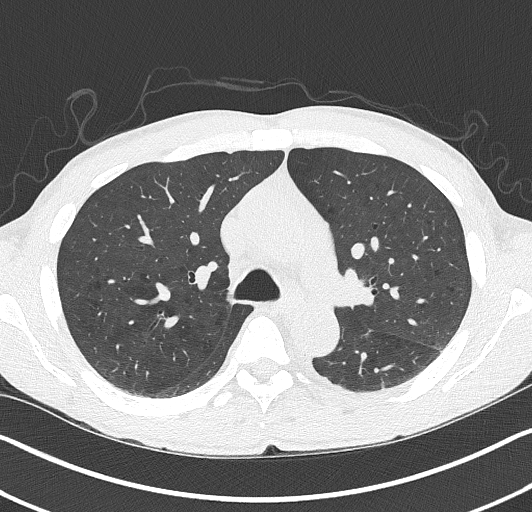
[im 213/320  lung]
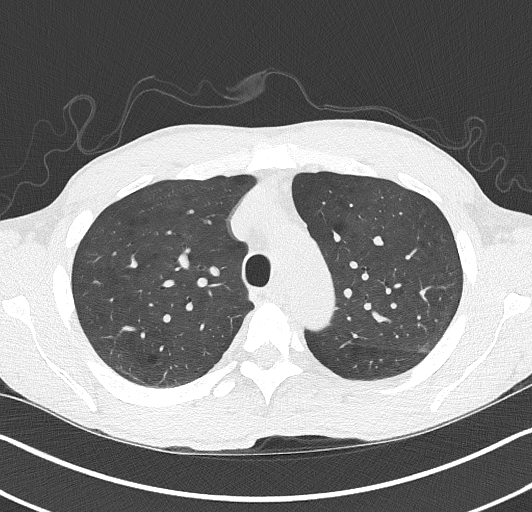
[im 240/320  mediastinal]
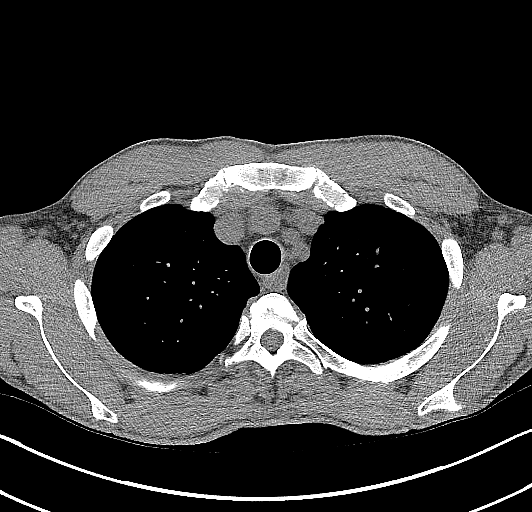
[im 240/320  lung]
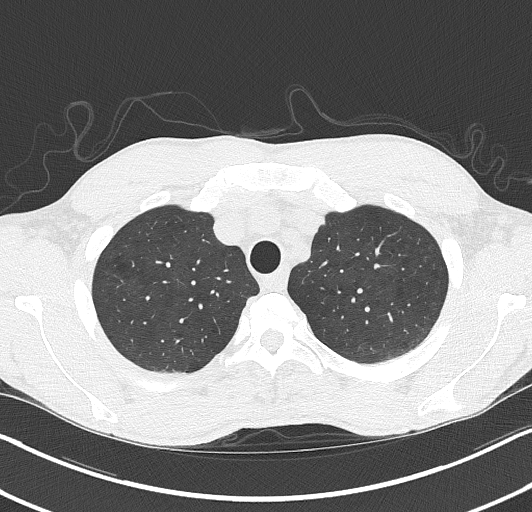
[im 266/320  lung]
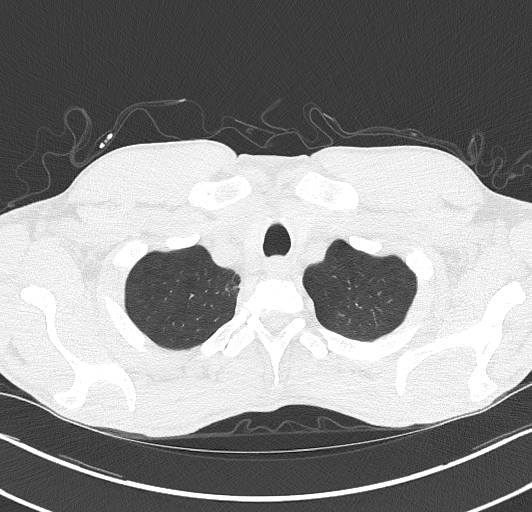
[im 293/320  lung]
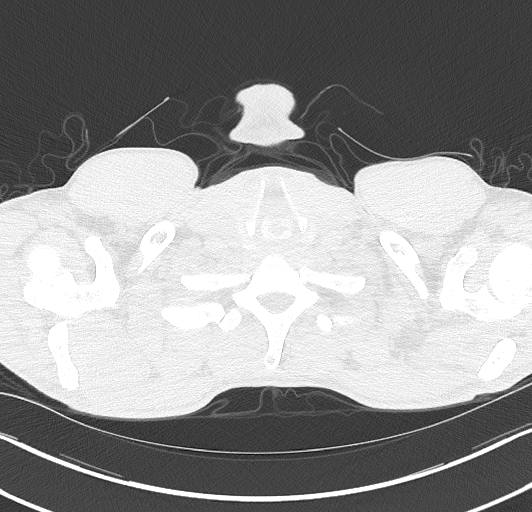

[14 of 36 positions shown; findings below may reference images not displayed]

FINDINGS: Cardiovascular: Heart size is mildly enlarged. There is no
significant pericardial fluid, thickening or pericardial
calcification. There is aortic atherosclerosis, as well as
atherosclerosis of the great vessels of the mediastinum and the
coronary arteries, including calcified atherosclerotic plaque in the
left anterior descending, left circumflex and right coronary
arteries.

Mediastinum/Nodes: No pathologically enlarged mediastinal or hilar
lymph nodes. Please note that accurate exclusion of hilar adenopathy
is limited on noncontrast CT scans. Esophagus is unremarkable in
appearance. No axillary lymphadenopathy.

Lungs/Pleura: High-resolution images demonstrate mild areas of
peripheral predominant ground-glass attenuation and mild septal
thickening in the lungs bilaterally where there is also parenchymal
banding. These findings are most evident in the lower lobes and are
largely but not completely resolved with prone imaging. No traction
bronchiectasis or honeycombing. Inspiratory and expiratory imaging
is unremarkable. Mild diffuse bronchial wall thickening with mild
centrilobular emphysema. No acute consolidative airspace disease. No
pleural effusions. 9 x 5 mm right lower lobe nodule (axial image 129
of series 9). No other larger more suspicious appearing pulmonary
nodules or masses are noted.

Upper Abdomen: Unremarkable.

Musculoskeletal: There are no aggressive appearing lytic or blastic
lesions noted in the visualized portions of the skeleton.
IMPRESSION: 1. There is a spectrum of findings which could be consistent with
early or mild interstitial lung disease, at this time considered
indeterminate for usual interstitial pneumonia (UIP) per current ATS
guidelines. If there is persistent clinical concern for interstitial
lung disease, repeat high-resolution chest CT would be recommended
in 12 months to assess for temporal changes in the appearance of the
lung parenchyma.
2. Mild diffuse bronchial wall thickening with mild centrilobular
emphysema.
3. Right lower lobe pulmonary nodule measuring 9 x 5 mm (mean
diameter of 7 mm), favored to represent a subpleural lymph node, but
nonspecific. Attention at time of repeat high-resolution chest CT is
recommended to ensure stability.
4. Mild cardiomegaly.
5. Aortic atherosclerosis, in addition to 3 vessel coronary artery
disease. Please note that although the presence of coronary artery
calcium documents the presence of coronary artery disease, the
severity of this disease and any potential stenosis cannot be
assessed on this non-gated CT examination. Assessment for potential
risk factor modification, dietary therapy or pharmacologic therapy
may be warranted, if clinically indicated.

Aortic Atherosclerosis (WMPWL-NA7.7) and Emphysema (WMPWL-4AL.O).

## 2020-05-17 DIAGNOSIS — Z992 Dependence on renal dialysis: Secondary | ICD-10-CM | POA: Diagnosis not present

## 2020-05-17 DIAGNOSIS — D509 Iron deficiency anemia, unspecified: Secondary | ICD-10-CM | POA: Diagnosis not present

## 2020-05-17 DIAGNOSIS — N186 End stage renal disease: Secondary | ICD-10-CM | POA: Diagnosis not present

## 2020-05-17 DIAGNOSIS — E8779 Other fluid overload: Secondary | ICD-10-CM | POA: Diagnosis not present

## 2020-05-18 DIAGNOSIS — E8779 Other fluid overload: Secondary | ICD-10-CM | POA: Diagnosis not present

## 2020-05-18 DIAGNOSIS — Z992 Dependence on renal dialysis: Secondary | ICD-10-CM | POA: Diagnosis not present

## 2020-05-18 DIAGNOSIS — N186 End stage renal disease: Secondary | ICD-10-CM | POA: Diagnosis not present

## 2020-05-18 DIAGNOSIS — D509 Iron deficiency anemia, unspecified: Secondary | ICD-10-CM | POA: Diagnosis not present

## 2020-05-19 DIAGNOSIS — E8779 Other fluid overload: Secondary | ICD-10-CM | POA: Diagnosis not present

## 2020-05-19 DIAGNOSIS — D509 Iron deficiency anemia, unspecified: Secondary | ICD-10-CM | POA: Diagnosis not present

## 2020-05-19 DIAGNOSIS — N186 End stage renal disease: Secondary | ICD-10-CM | POA: Diagnosis not present

## 2020-05-19 DIAGNOSIS — Z992 Dependence on renal dialysis: Secondary | ICD-10-CM | POA: Diagnosis not present

## 2020-05-20 ENCOUNTER — Encounter: Admit: 2020-05-20 | Discharge: 2020-05-21 | Payer: MEDICARE

## 2020-05-20 DIAGNOSIS — R069 Unspecified abnormalities of breathing: Secondary | ICD-10-CM | POA: Diagnosis not present

## 2020-05-21 DIAGNOSIS — E8779 Other fluid overload: Secondary | ICD-10-CM | POA: Diagnosis not present

## 2020-05-21 DIAGNOSIS — Z992 Dependence on renal dialysis: Secondary | ICD-10-CM | POA: Diagnosis not present

## 2020-05-21 DIAGNOSIS — N186 End stage renal disease: Secondary | ICD-10-CM | POA: Diagnosis not present

## 2020-05-21 DIAGNOSIS — D509 Iron deficiency anemia, unspecified: Secondary | ICD-10-CM | POA: Diagnosis not present

## 2020-05-21 DIAGNOSIS — G4730 Sleep apnea, unspecified: Principal | ICD-10-CM

## 2020-05-21 DIAGNOSIS — G4733 Obstructive sleep apnea (adult) (pediatric): Principal | ICD-10-CM

## 2020-05-24 DIAGNOSIS — Z992 Dependence on renal dialysis: Secondary | ICD-10-CM | POA: Diagnosis not present

## 2020-05-24 DIAGNOSIS — E8779 Other fluid overload: Secondary | ICD-10-CM | POA: Diagnosis not present

## 2020-05-24 DIAGNOSIS — N186 End stage renal disease: Secondary | ICD-10-CM | POA: Diagnosis not present

## 2020-05-24 DIAGNOSIS — D509 Iron deficiency anemia, unspecified: Secondary | ICD-10-CM | POA: Diagnosis not present

## 2020-05-24 DIAGNOSIS — L409 Psoriasis, unspecified: Principal | ICD-10-CM

## 2020-05-24 MED ORDER — CALCIPOTRIENE 0.005 % TOPICAL OINTMENT
Freq: Two times a day (BID) | TOPICAL | 5 refills | 0.00000 days | Status: CP
Start: 2020-05-24 — End: 2021-05-24

## 2020-05-25 DIAGNOSIS — D509 Iron deficiency anemia, unspecified: Secondary | ICD-10-CM | POA: Diagnosis not present

## 2020-05-25 DIAGNOSIS — T82858A Stenosis of vascular prosthetic devices, implants and grafts, initial encounter: Secondary | ICD-10-CM | POA: Diagnosis not present

## 2020-05-25 DIAGNOSIS — E8779 Other fluid overload: Secondary | ICD-10-CM | POA: Diagnosis not present

## 2020-05-25 DIAGNOSIS — N186 End stage renal disease: Secondary | ICD-10-CM | POA: Diagnosis not present

## 2020-05-25 DIAGNOSIS — Z992 Dependence on renal dialysis: Secondary | ICD-10-CM | POA: Diagnosis not present

## 2020-05-26 DIAGNOSIS — Z992 Dependence on renal dialysis: Secondary | ICD-10-CM | POA: Diagnosis not present

## 2020-05-26 DIAGNOSIS — N186 End stage renal disease: Secondary | ICD-10-CM | POA: Diagnosis not present

## 2020-05-26 DIAGNOSIS — E8779 Other fluid overload: Secondary | ICD-10-CM | POA: Diagnosis not present

## 2020-05-26 DIAGNOSIS — D509 Iron deficiency anemia, unspecified: Secondary | ICD-10-CM | POA: Diagnosis not present

## 2020-05-28 DIAGNOSIS — E8779 Other fluid overload: Secondary | ICD-10-CM | POA: Diagnosis not present

## 2020-05-28 DIAGNOSIS — N186 End stage renal disease: Secondary | ICD-10-CM | POA: Diagnosis not present

## 2020-05-28 DIAGNOSIS — D509 Iron deficiency anemia, unspecified: Secondary | ICD-10-CM | POA: Diagnosis not present

## 2020-05-28 DIAGNOSIS — Z992 Dependence on renal dialysis: Secondary | ICD-10-CM | POA: Diagnosis not present

## 2020-05-29 IMAGING — CR DG CHEST 2V
1 series · 2 of 2 positions shown · non-contrast
Comparison: 12/03/2018

CLINICAL DATA: Hypertension

EXAM:
CHEST - 2 VIEW

[Series 1: dg chest 2 view · 0.14mm/px · 2 of 2 slices shown]
[im 1/2]
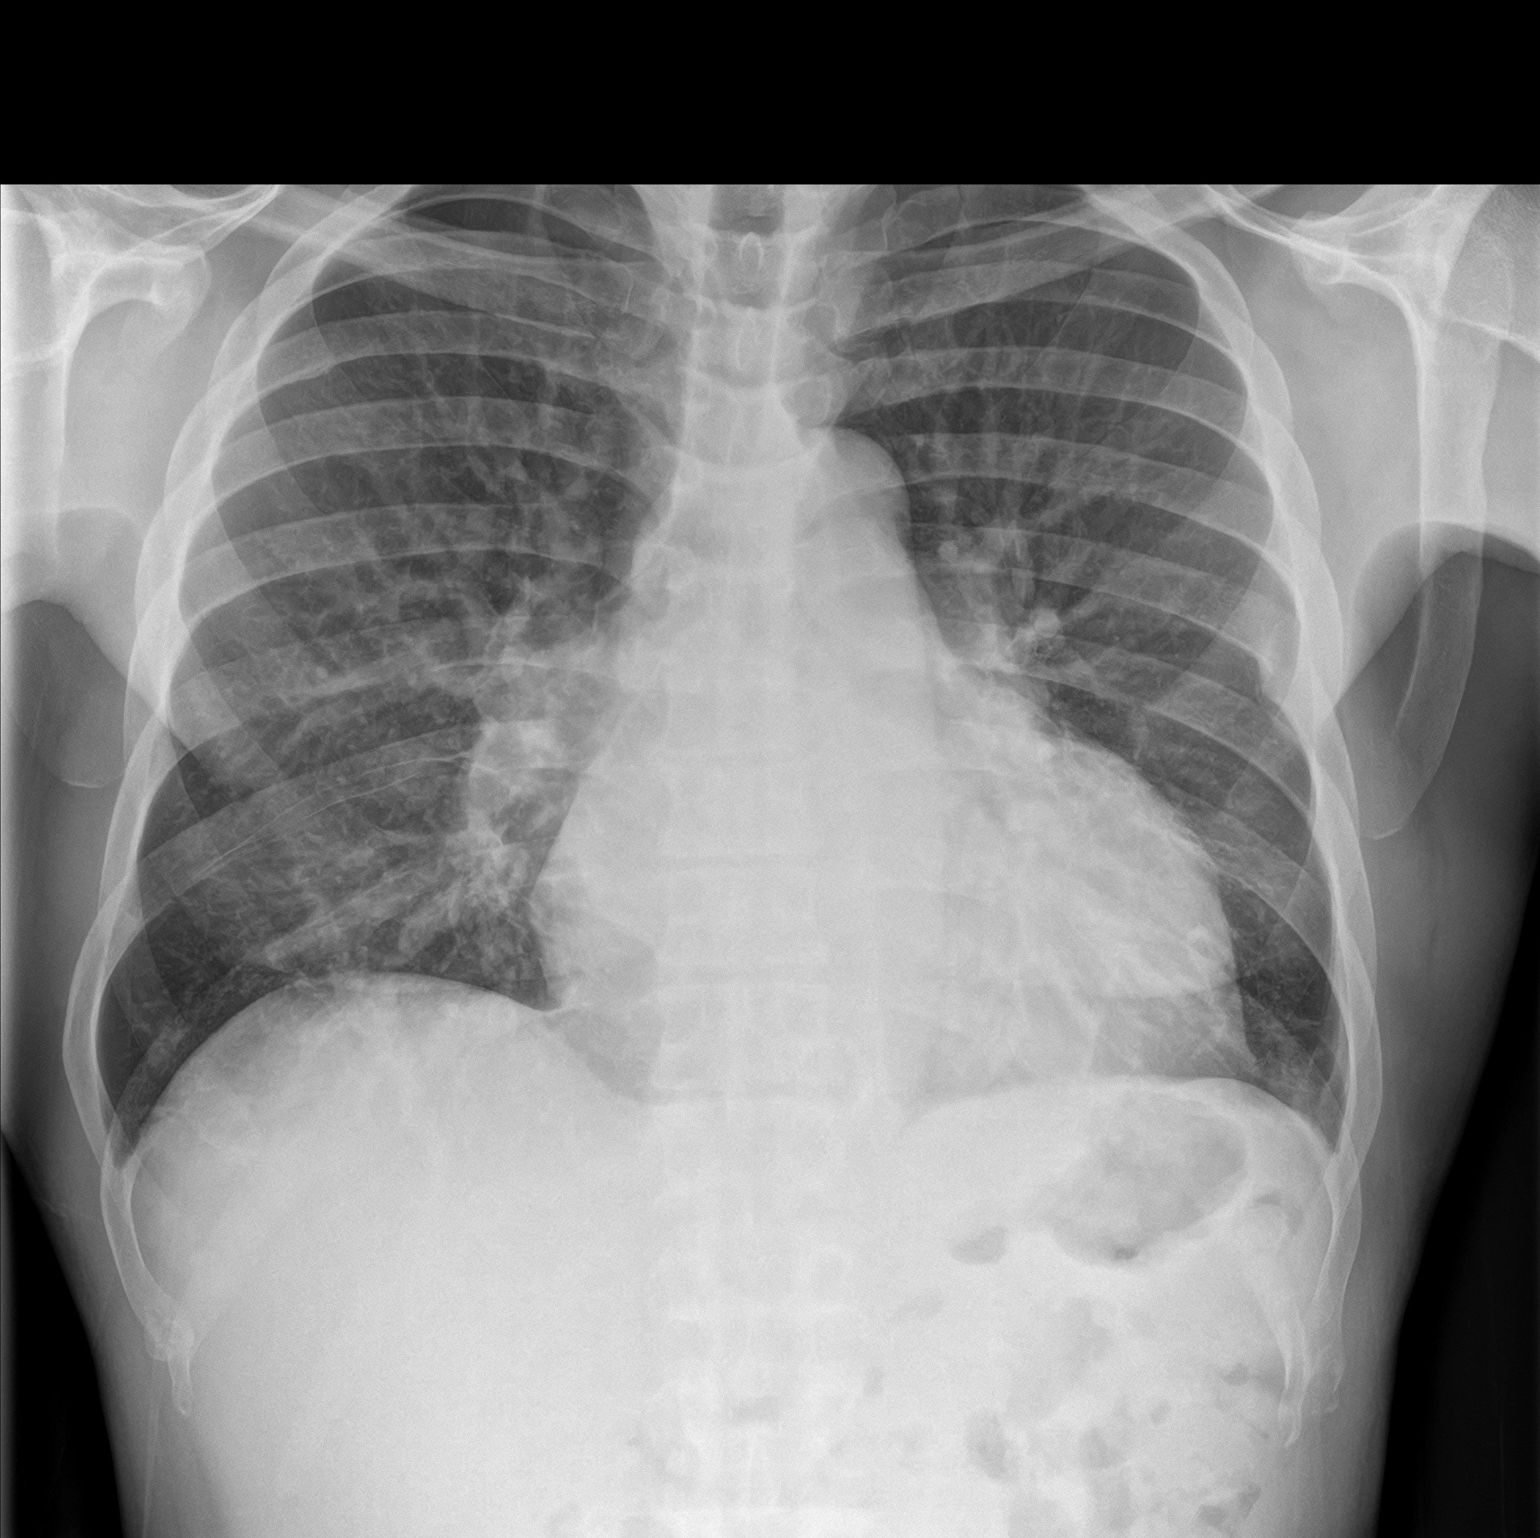
[im 2/2]
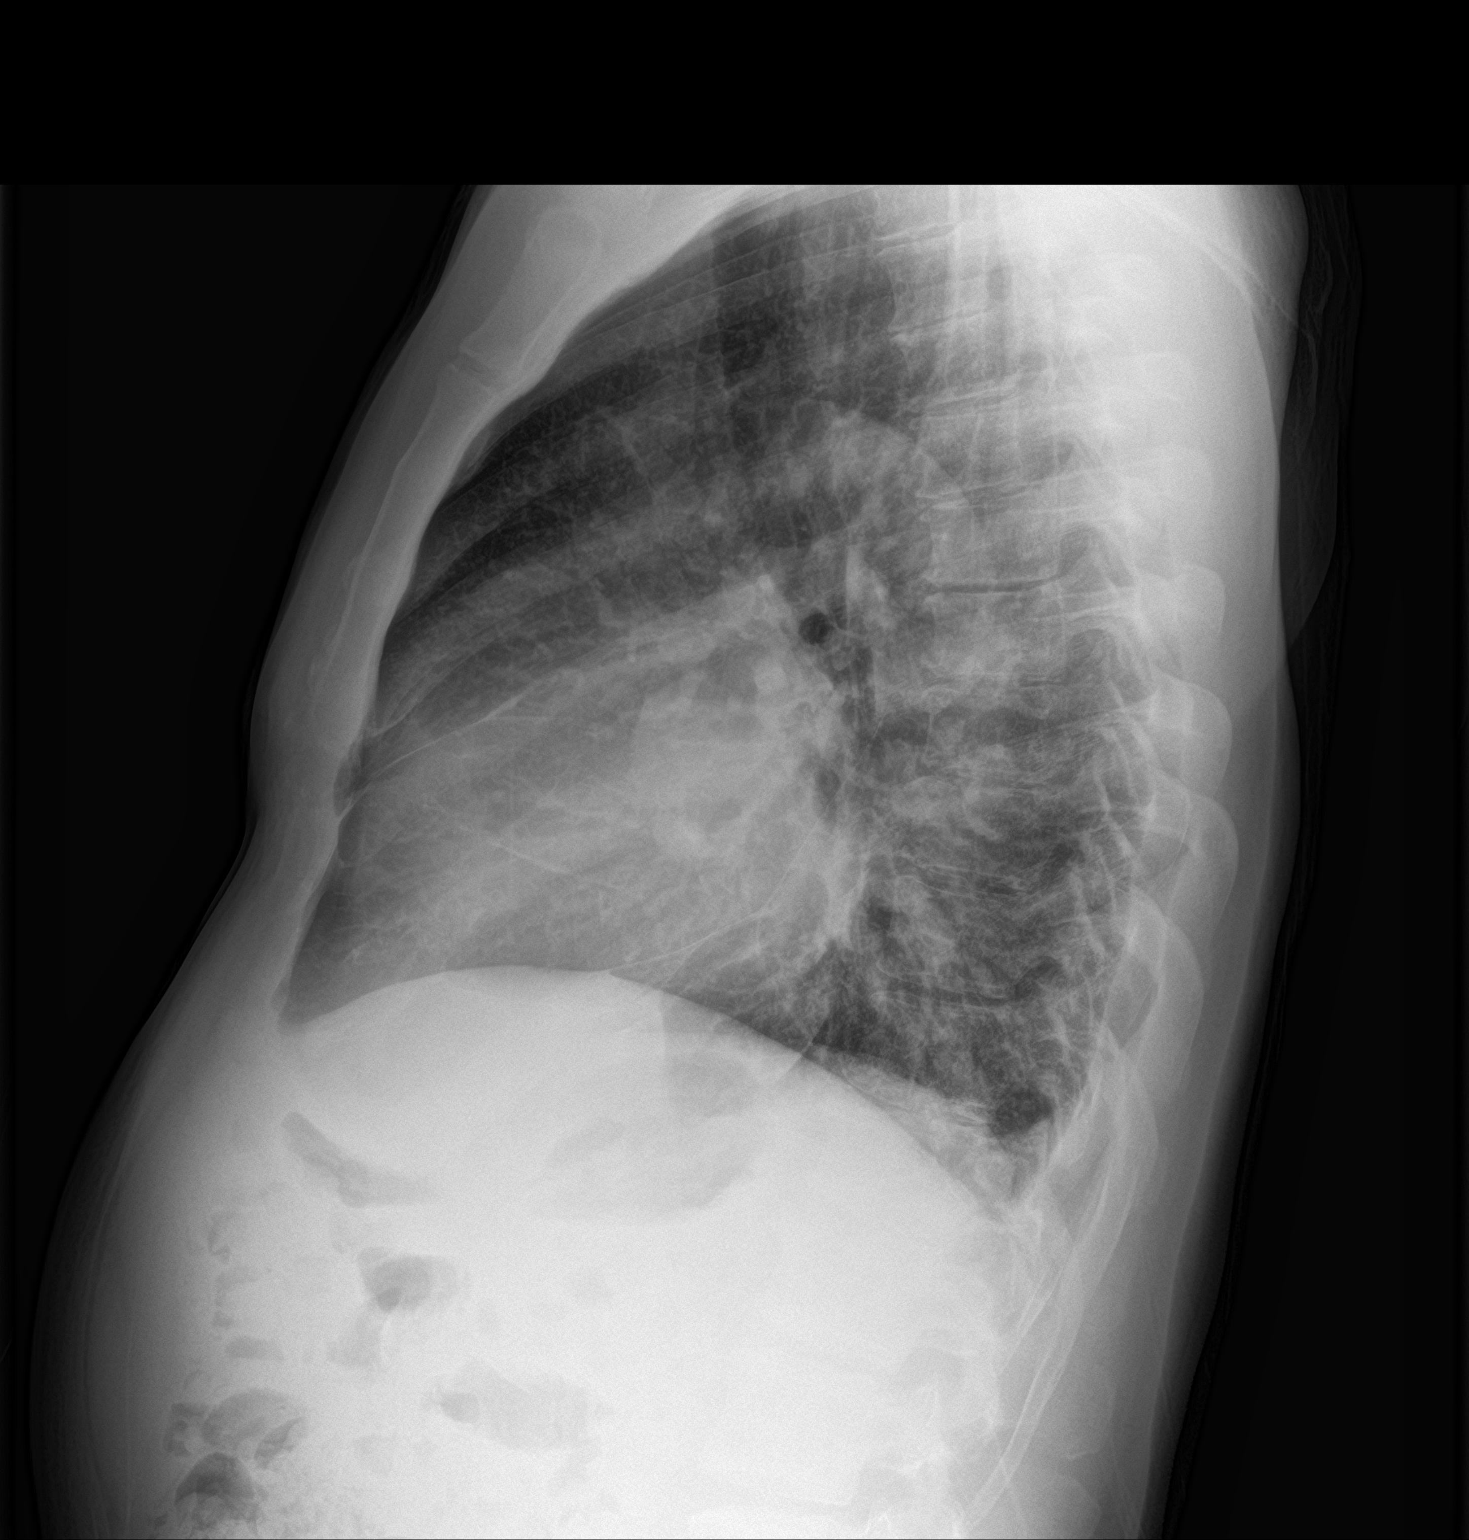

[2 of 2 positions shown; findings below may reference images not displayed]

FINDINGS: The heart size is mildly enlarged, stable. No focal airspace
consolidation, pleural effusion, or pneumothorax. Prior left seventh
rib fracture.
IMPRESSION: No active cardiopulmonary disease.

## 2020-05-31 DIAGNOSIS — E8779 Other fluid overload: Secondary | ICD-10-CM | POA: Diagnosis not present

## 2020-05-31 DIAGNOSIS — N186 End stage renal disease: Secondary | ICD-10-CM | POA: Diagnosis not present

## 2020-05-31 DIAGNOSIS — Z992 Dependence on renal dialysis: Secondary | ICD-10-CM | POA: Diagnosis not present

## 2020-05-31 DIAGNOSIS — D509 Iron deficiency anemia, unspecified: Secondary | ICD-10-CM | POA: Diagnosis not present

## 2020-05-31 DIAGNOSIS — U099 Long COVID: Principal | ICD-10-CM

## 2020-05-31 MED ORDER — MINOXIDIL 2.5 MG TABLET
ORAL_TABLET | Freq: Every day | ORAL | 0 refills | 10.00000 days | Status: CP
Start: 2020-05-31 — End: 2021-05-31

## 2020-06-01 DIAGNOSIS — N186 End stage renal disease: Secondary | ICD-10-CM | POA: Diagnosis not present

## 2020-06-01 DIAGNOSIS — D509 Iron deficiency anemia, unspecified: Secondary | ICD-10-CM | POA: Diagnosis not present

## 2020-06-01 DIAGNOSIS — E8779 Other fluid overload: Secondary | ICD-10-CM | POA: Diagnosis not present

## 2020-06-01 DIAGNOSIS — Z992 Dependence on renal dialysis: Secondary | ICD-10-CM | POA: Diagnosis not present

## 2020-06-02 DIAGNOSIS — N186 End stage renal disease: Secondary | ICD-10-CM | POA: Diagnosis not present

## 2020-06-02 DIAGNOSIS — D509 Iron deficiency anemia, unspecified: Secondary | ICD-10-CM | POA: Diagnosis not present

## 2020-06-02 DIAGNOSIS — Z992 Dependence on renal dialysis: Secondary | ICD-10-CM | POA: Diagnosis not present

## 2020-06-02 DIAGNOSIS — E8779 Other fluid overload: Secondary | ICD-10-CM | POA: Diagnosis not present

## 2020-06-03 DIAGNOSIS — L409 Psoriasis, unspecified: Principal | ICD-10-CM

## 2020-06-03 MED ORDER — CLOBETASOL 0.05 % TOPICAL OINTMENT
Freq: Two times a day (BID) | TOPICAL | 5 refills | 0.00000 days | Status: CP
Start: 2020-06-03 — End: 2021-06-03

## 2020-06-04 DIAGNOSIS — N186 End stage renal disease: Secondary | ICD-10-CM | POA: Diagnosis not present

## 2020-06-04 DIAGNOSIS — E8779 Other fluid overload: Secondary | ICD-10-CM | POA: Diagnosis not present

## 2020-06-04 DIAGNOSIS — D509 Iron deficiency anemia, unspecified: Secondary | ICD-10-CM | POA: Diagnosis not present

## 2020-06-04 DIAGNOSIS — Z992 Dependence on renal dialysis: Secondary | ICD-10-CM | POA: Diagnosis not present

## 2020-06-07 DIAGNOSIS — Z992 Dependence on renal dialysis: Secondary | ICD-10-CM | POA: Diagnosis not present

## 2020-06-07 DIAGNOSIS — D509 Iron deficiency anemia, unspecified: Secondary | ICD-10-CM | POA: Diagnosis not present

## 2020-06-07 DIAGNOSIS — E8779 Other fluid overload: Secondary | ICD-10-CM | POA: Diagnosis not present

## 2020-06-07 DIAGNOSIS — N186 End stage renal disease: Secondary | ICD-10-CM | POA: Diagnosis not present

## 2020-06-08 DIAGNOSIS — Z992 Dependence on renal dialysis: Secondary | ICD-10-CM | POA: Diagnosis not present

## 2020-06-08 DIAGNOSIS — N186 End stage renal disease: Secondary | ICD-10-CM | POA: Diagnosis not present

## 2020-06-08 DIAGNOSIS — E8779 Other fluid overload: Secondary | ICD-10-CM | POA: Diagnosis not present

## 2020-06-08 DIAGNOSIS — D509 Iron deficiency anemia, unspecified: Secondary | ICD-10-CM | POA: Diagnosis not present

## 2020-06-09 DIAGNOSIS — N186 End stage renal disease: Secondary | ICD-10-CM | POA: Diagnosis not present

## 2020-06-09 DIAGNOSIS — E8779 Other fluid overload: Secondary | ICD-10-CM | POA: Diagnosis not present

## 2020-06-09 DIAGNOSIS — D509 Iron deficiency anemia, unspecified: Secondary | ICD-10-CM | POA: Diagnosis not present

## 2020-06-09 DIAGNOSIS — Z992 Dependence on renal dialysis: Secondary | ICD-10-CM | POA: Diagnosis not present

## 2020-06-11 DIAGNOSIS — D509 Iron deficiency anemia, unspecified: Secondary | ICD-10-CM | POA: Diagnosis not present

## 2020-06-11 DIAGNOSIS — Z992 Dependence on renal dialysis: Secondary | ICD-10-CM | POA: Diagnosis not present

## 2020-06-11 DIAGNOSIS — E8779 Other fluid overload: Secondary | ICD-10-CM | POA: Diagnosis not present

## 2020-06-11 DIAGNOSIS — N186 End stage renal disease: Secondary | ICD-10-CM | POA: Diagnosis not present

## 2020-06-12 DIAGNOSIS — N186 End stage renal disease: Secondary | ICD-10-CM | POA: Diagnosis not present

## 2020-06-12 DIAGNOSIS — Z992 Dependence on renal dialysis: Secondary | ICD-10-CM | POA: Diagnosis not present

## 2020-06-14 DIAGNOSIS — N186 End stage renal disease: Secondary | ICD-10-CM | POA: Diagnosis not present

## 2020-06-14 DIAGNOSIS — E8779 Other fluid overload: Secondary | ICD-10-CM | POA: Diagnosis not present

## 2020-06-14 DIAGNOSIS — D509 Iron deficiency anemia, unspecified: Secondary | ICD-10-CM | POA: Diagnosis not present

## 2020-06-14 DIAGNOSIS — Z992 Dependence on renal dialysis: Secondary | ICD-10-CM | POA: Diagnosis not present

## 2020-06-14 DIAGNOSIS — N2581 Secondary hyperparathyroidism of renal origin: Secondary | ICD-10-CM | POA: Diagnosis not present

## 2020-06-15 DIAGNOSIS — N186 End stage renal disease: Secondary | ICD-10-CM | POA: Diagnosis not present

## 2020-06-15 DIAGNOSIS — Z992 Dependence on renal dialysis: Secondary | ICD-10-CM | POA: Diagnosis not present

## 2020-06-15 DIAGNOSIS — N2581 Secondary hyperparathyroidism of renal origin: Secondary | ICD-10-CM | POA: Diagnosis not present

## 2020-06-15 DIAGNOSIS — D509 Iron deficiency anemia, unspecified: Secondary | ICD-10-CM | POA: Diagnosis not present

## 2020-06-15 DIAGNOSIS — E8779 Other fluid overload: Secondary | ICD-10-CM | POA: Diagnosis not present

## 2020-06-15 MED ORDER — CINACALCET 30 MG TABLET
ORAL_TABLET | Freq: Every day | ORAL | 3 refills | 90 days | Status: CP
Start: 2020-06-15 — End: 2020-09-13

## 2020-06-16 DIAGNOSIS — D509 Iron deficiency anemia, unspecified: Secondary | ICD-10-CM | POA: Diagnosis not present

## 2020-06-16 DIAGNOSIS — E8779 Other fluid overload: Secondary | ICD-10-CM | POA: Diagnosis not present

## 2020-06-16 DIAGNOSIS — N2581 Secondary hyperparathyroidism of renal origin: Secondary | ICD-10-CM | POA: Diagnosis not present

## 2020-06-16 DIAGNOSIS — N186 End stage renal disease: Secondary | ICD-10-CM | POA: Diagnosis not present

## 2020-06-16 DIAGNOSIS — Z992 Dependence on renal dialysis: Secondary | ICD-10-CM | POA: Diagnosis not present

## 2020-06-18 DIAGNOSIS — D509 Iron deficiency anemia, unspecified: Secondary | ICD-10-CM | POA: Diagnosis not present

## 2020-06-18 DIAGNOSIS — N2581 Secondary hyperparathyroidism of renal origin: Secondary | ICD-10-CM | POA: Diagnosis not present

## 2020-06-18 DIAGNOSIS — N186 End stage renal disease: Secondary | ICD-10-CM | POA: Diagnosis not present

## 2020-06-18 DIAGNOSIS — E8779 Other fluid overload: Secondary | ICD-10-CM | POA: Diagnosis not present

## 2020-06-18 DIAGNOSIS — Z992 Dependence on renal dialysis: Secondary | ICD-10-CM | POA: Diagnosis not present

## 2020-06-21 DIAGNOSIS — N186 End stage renal disease: Secondary | ICD-10-CM | POA: Diagnosis not present

## 2020-06-21 DIAGNOSIS — N2581 Secondary hyperparathyroidism of renal origin: Secondary | ICD-10-CM | POA: Diagnosis not present

## 2020-06-21 DIAGNOSIS — E8779 Other fluid overload: Secondary | ICD-10-CM | POA: Diagnosis not present

## 2020-06-21 DIAGNOSIS — Z992 Dependence on renal dialysis: Secondary | ICD-10-CM | POA: Diagnosis not present

## 2020-06-21 DIAGNOSIS — D509 Iron deficiency anemia, unspecified: Secondary | ICD-10-CM | POA: Diagnosis not present

## 2020-06-22 DIAGNOSIS — E8779 Other fluid overload: Secondary | ICD-10-CM | POA: Diagnosis not present

## 2020-06-22 DIAGNOSIS — Z992 Dependence on renal dialysis: Secondary | ICD-10-CM | POA: Diagnosis not present

## 2020-06-22 DIAGNOSIS — N186 End stage renal disease: Secondary | ICD-10-CM | POA: Diagnosis not present

## 2020-06-22 DIAGNOSIS — D509 Iron deficiency anemia, unspecified: Secondary | ICD-10-CM | POA: Diagnosis not present

## 2020-06-22 DIAGNOSIS — N2581 Secondary hyperparathyroidism of renal origin: Secondary | ICD-10-CM | POA: Diagnosis not present

## 2020-06-23 DIAGNOSIS — D509 Iron deficiency anemia, unspecified: Secondary | ICD-10-CM | POA: Diagnosis not present

## 2020-06-23 DIAGNOSIS — Z992 Dependence on renal dialysis: Secondary | ICD-10-CM | POA: Diagnosis not present

## 2020-06-23 DIAGNOSIS — E8779 Other fluid overload: Secondary | ICD-10-CM | POA: Diagnosis not present

## 2020-06-23 DIAGNOSIS — N186 End stage renal disease: Secondary | ICD-10-CM | POA: Diagnosis not present

## 2020-06-23 DIAGNOSIS — N2581 Secondary hyperparathyroidism of renal origin: Secondary | ICD-10-CM | POA: Diagnosis not present

## 2020-06-23 MED ORDER — PROMETHAZINE 6.25 MG-CODEINE 10 MG/5 ML SYRUP
ORAL | 0 refills | 2 days | Status: CP | PRN
Start: 2020-06-23 — End: ?

## 2020-06-25 DIAGNOSIS — N186 End stage renal disease: Secondary | ICD-10-CM | POA: Diagnosis not present

## 2020-06-25 DIAGNOSIS — Z992 Dependence on renal dialysis: Secondary | ICD-10-CM | POA: Diagnosis not present

## 2020-06-25 DIAGNOSIS — E8779 Other fluid overload: Secondary | ICD-10-CM | POA: Diagnosis not present

## 2020-06-25 DIAGNOSIS — D509 Iron deficiency anemia, unspecified: Secondary | ICD-10-CM | POA: Diagnosis not present

## 2020-06-25 DIAGNOSIS — N2581 Secondary hyperparathyroidism of renal origin: Secondary | ICD-10-CM | POA: Diagnosis not present

## 2020-06-28 ENCOUNTER — Encounter
Admit: 2020-06-28 | Discharge: 2020-06-29 | Disposition: A | Discharge status: Home / Self Care | Payer: MEDICARE | Attending: Emergency Medicine

## 2020-06-28 DIAGNOSIS — I132 Hypertensive heart and chronic kidney disease with heart failure and with stage 5 chronic kidney disease, or end stage renal disease: Secondary | ICD-10-CM | POA: Diagnosis not present

## 2020-06-28 DIAGNOSIS — I5022 Chronic systolic (congestive) heart failure: Secondary | ICD-10-CM | POA: Diagnosis not present

## 2020-06-28 DIAGNOSIS — R0602 Shortness of breath: Secondary | ICD-10-CM | POA: Diagnosis not present

## 2020-06-28 DIAGNOSIS — R1013 Epigastric pain: Secondary | ICD-10-CM | POA: Diagnosis not present

## 2020-06-28 DIAGNOSIS — R0789 Other chest pain: Secondary | ICD-10-CM | POA: Diagnosis not present

## 2020-06-28 DIAGNOSIS — I491 Atrial premature depolarization: Secondary | ICD-10-CM | POA: Diagnosis not present

## 2020-06-28 DIAGNOSIS — J811 Chronic pulmonary edema: Secondary | ICD-10-CM | POA: Diagnosis not present

## 2020-06-28 DIAGNOSIS — M542 Cervicalgia: Secondary | ICD-10-CM | POA: Diagnosis not present

## 2020-06-28 DIAGNOSIS — Z79899 Other long term (current) drug therapy: Secondary | ICD-10-CM | POA: Diagnosis not present

## 2020-06-28 DIAGNOSIS — D649 Anemia, unspecified: Secondary | ICD-10-CM | POA: Diagnosis not present

## 2020-06-28 DIAGNOSIS — I10 Essential (primary) hypertension: Secondary | ICD-10-CM | POA: Diagnosis not present

## 2020-06-28 DIAGNOSIS — R000 Tachycardia, unspecified: Secondary | ICD-10-CM | POA: Diagnosis not present

## 2020-06-28 DIAGNOSIS — Z8616 Personal history of COVID-19: Secondary | ICD-10-CM | POA: Diagnosis not present

## 2020-06-28 DIAGNOSIS — Z791 Long term (current) use of non-steroidal anti-inflammatories (NSAID): Secondary | ICD-10-CM | POA: Diagnosis not present

## 2020-06-28 DIAGNOSIS — N186 End stage renal disease: Secondary | ICD-10-CM | POA: Diagnosis not present

## 2020-06-28 DIAGNOSIS — R778 Other specified abnormalities of plasma proteins: Secondary | ICD-10-CM | POA: Diagnosis not present

## 2020-06-28 DIAGNOSIS — R079 Chest pain, unspecified: Secondary | ICD-10-CM | POA: Diagnosis not present

## 2020-06-28 DIAGNOSIS — E8779 Other fluid overload: Secondary | ICD-10-CM | POA: Diagnosis not present

## 2020-06-28 DIAGNOSIS — R9431 Abnormal electrocardiogram [ECG] [EKG]: Secondary | ICD-10-CM | POA: Diagnosis not present

## 2020-06-28 DIAGNOSIS — Z992 Dependence on renal dialysis: Secondary | ICD-10-CM | POA: Diagnosis not present

## 2020-06-28 DIAGNOSIS — R109 Unspecified abdominal pain: Secondary | ICD-10-CM | POA: Diagnosis not present

## 2020-06-28 DIAGNOSIS — D509 Iron deficiency anemia, unspecified: Secondary | ICD-10-CM | POA: Diagnosis not present

## 2020-06-28 DIAGNOSIS — Z7952 Long term (current) use of systemic steroids: Secondary | ICD-10-CM | POA: Diagnosis not present

## 2020-06-28 DIAGNOSIS — N2581 Secondary hyperparathyroidism of renal origin: Secondary | ICD-10-CM | POA: Diagnosis not present

## 2020-06-29 DIAGNOSIS — N186 End stage renal disease: Secondary | ICD-10-CM | POA: Diagnosis not present

## 2020-06-29 DIAGNOSIS — E8779 Other fluid overload: Secondary | ICD-10-CM | POA: Diagnosis not present

## 2020-06-29 DIAGNOSIS — D509 Iron deficiency anemia, unspecified: Secondary | ICD-10-CM | POA: Diagnosis not present

## 2020-06-29 DIAGNOSIS — N2581 Secondary hyperparathyroidism of renal origin: Secondary | ICD-10-CM | POA: Diagnosis not present

## 2020-06-29 DIAGNOSIS — Z992 Dependence on renal dialysis: Secondary | ICD-10-CM | POA: Diagnosis not present

## 2020-06-30 DIAGNOSIS — Z992 Dependence on renal dialysis: Secondary | ICD-10-CM | POA: Diagnosis not present

## 2020-06-30 DIAGNOSIS — E8779 Other fluid overload: Secondary | ICD-10-CM | POA: Diagnosis not present

## 2020-06-30 DIAGNOSIS — D509 Iron deficiency anemia, unspecified: Secondary | ICD-10-CM | POA: Diagnosis not present

## 2020-06-30 DIAGNOSIS — N186 End stage renal disease: Secondary | ICD-10-CM | POA: Diagnosis not present

## 2020-06-30 DIAGNOSIS — N2581 Secondary hyperparathyroidism of renal origin: Secondary | ICD-10-CM | POA: Diagnosis not present

## 2020-07-02 DIAGNOSIS — N2581 Secondary hyperparathyroidism of renal origin: Secondary | ICD-10-CM | POA: Diagnosis not present

## 2020-07-02 DIAGNOSIS — Z992 Dependence on renal dialysis: Secondary | ICD-10-CM | POA: Diagnosis not present

## 2020-07-02 DIAGNOSIS — E8779 Other fluid overload: Secondary | ICD-10-CM | POA: Diagnosis not present

## 2020-07-02 DIAGNOSIS — D509 Iron deficiency anemia, unspecified: Secondary | ICD-10-CM | POA: Diagnosis not present

## 2020-07-02 DIAGNOSIS — N186 End stage renal disease: Secondary | ICD-10-CM | POA: Diagnosis not present

## 2020-07-05 DIAGNOSIS — N186 End stage renal disease: Secondary | ICD-10-CM | POA: Diagnosis not present

## 2020-07-05 DIAGNOSIS — D509 Iron deficiency anemia, unspecified: Secondary | ICD-10-CM | POA: Diagnosis not present

## 2020-07-05 DIAGNOSIS — N2581 Secondary hyperparathyroidism of renal origin: Secondary | ICD-10-CM | POA: Diagnosis not present

## 2020-07-05 DIAGNOSIS — Z992 Dependence on renal dialysis: Secondary | ICD-10-CM | POA: Diagnosis not present

## 2020-07-05 DIAGNOSIS — E8779 Other fluid overload: Secondary | ICD-10-CM | POA: Diagnosis not present

## 2020-07-06 DIAGNOSIS — N2581 Secondary hyperparathyroidism of renal origin: Secondary | ICD-10-CM | POA: Diagnosis not present

## 2020-07-06 DIAGNOSIS — N186 End stage renal disease: Secondary | ICD-10-CM | POA: Diagnosis not present

## 2020-07-06 DIAGNOSIS — E8779 Other fluid overload: Secondary | ICD-10-CM | POA: Diagnosis not present

## 2020-07-06 DIAGNOSIS — Z992 Dependence on renal dialysis: Secondary | ICD-10-CM | POA: Diagnosis not present

## 2020-07-06 DIAGNOSIS — D509 Iron deficiency anemia, unspecified: Secondary | ICD-10-CM | POA: Diagnosis not present

## 2020-07-07 DIAGNOSIS — D509 Iron deficiency anemia, unspecified: Secondary | ICD-10-CM | POA: Diagnosis not present

## 2020-07-07 DIAGNOSIS — Z992 Dependence on renal dialysis: Secondary | ICD-10-CM | POA: Diagnosis not present

## 2020-07-07 DIAGNOSIS — N186 End stage renal disease: Secondary | ICD-10-CM | POA: Diagnosis not present

## 2020-07-07 DIAGNOSIS — N2581 Secondary hyperparathyroidism of renal origin: Secondary | ICD-10-CM | POA: Diagnosis not present

## 2020-07-07 DIAGNOSIS — E8779 Other fluid overload: Secondary | ICD-10-CM | POA: Diagnosis not present

## 2020-07-09 DIAGNOSIS — N2581 Secondary hyperparathyroidism of renal origin: Secondary | ICD-10-CM | POA: Diagnosis not present

## 2020-07-09 DIAGNOSIS — Z992 Dependence on renal dialysis: Secondary | ICD-10-CM | POA: Diagnosis not present

## 2020-07-09 DIAGNOSIS — E8779 Other fluid overload: Secondary | ICD-10-CM | POA: Diagnosis not present

## 2020-07-09 DIAGNOSIS — D509 Iron deficiency anemia, unspecified: Secondary | ICD-10-CM | POA: Diagnosis not present

## 2020-07-09 DIAGNOSIS — N186 End stage renal disease: Secondary | ICD-10-CM | POA: Diagnosis not present

## 2020-07-12 DIAGNOSIS — N186 End stage renal disease: Secondary | ICD-10-CM | POA: Diagnosis not present

## 2020-07-12 DIAGNOSIS — D509 Iron deficiency anemia, unspecified: Secondary | ICD-10-CM | POA: Diagnosis not present

## 2020-07-12 DIAGNOSIS — E8779 Other fluid overload: Secondary | ICD-10-CM | POA: Diagnosis not present

## 2020-07-12 DIAGNOSIS — N2581 Secondary hyperparathyroidism of renal origin: Secondary | ICD-10-CM | POA: Diagnosis not present

## 2020-07-12 DIAGNOSIS — Z992 Dependence on renal dialysis: Secondary | ICD-10-CM | POA: Diagnosis not present

## 2020-07-13 ENCOUNTER — Encounter: Admit: 2020-07-13 | Discharge: 2020-07-14 | Payer: MEDICARE

## 2020-07-13 DIAGNOSIS — D509 Iron deficiency anemia, unspecified: Secondary | ICD-10-CM | POA: Diagnosis not present

## 2020-07-13 DIAGNOSIS — Z992 Dependence on renal dialysis: Secondary | ICD-10-CM | POA: Diagnosis not present

## 2020-07-13 DIAGNOSIS — E8779 Other fluid overload: Secondary | ICD-10-CM | POA: Diagnosis not present

## 2020-07-13 DIAGNOSIS — N186 End stage renal disease: Secondary | ICD-10-CM | POA: Diagnosis not present

## 2020-07-13 DIAGNOSIS — N2581 Secondary hyperparathyroidism of renal origin: Secondary | ICD-10-CM | POA: Diagnosis not present

## 2020-07-14 DIAGNOSIS — N2581 Secondary hyperparathyroidism of renal origin: Secondary | ICD-10-CM | POA: Diagnosis not present

## 2020-07-14 DIAGNOSIS — N186 End stage renal disease: Secondary | ICD-10-CM | POA: Diagnosis not present

## 2020-07-14 DIAGNOSIS — D509 Iron deficiency anemia, unspecified: Secondary | ICD-10-CM | POA: Diagnosis not present

## 2020-07-14 DIAGNOSIS — E8779 Other fluid overload: Secondary | ICD-10-CM | POA: Diagnosis not present

## 2020-07-14 DIAGNOSIS — Z992 Dependence on renal dialysis: Secondary | ICD-10-CM | POA: Diagnosis not present

## 2020-07-14 DIAGNOSIS — D631 Anemia in chronic kidney disease: Secondary | ICD-10-CM | POA: Diagnosis not present

## 2020-07-16 DIAGNOSIS — N2581 Secondary hyperparathyroidism of renal origin: Secondary | ICD-10-CM | POA: Diagnosis not present

## 2020-07-16 DIAGNOSIS — Z992 Dependence on renal dialysis: Secondary | ICD-10-CM | POA: Diagnosis not present

## 2020-07-16 DIAGNOSIS — D509 Iron deficiency anemia, unspecified: Secondary | ICD-10-CM | POA: Diagnosis not present

## 2020-07-16 DIAGNOSIS — D631 Anemia in chronic kidney disease: Secondary | ICD-10-CM | POA: Diagnosis not present

## 2020-07-16 DIAGNOSIS — N186 End stage renal disease: Secondary | ICD-10-CM | POA: Diagnosis not present

## 2020-07-16 DIAGNOSIS — E8779 Other fluid overload: Secondary | ICD-10-CM | POA: Diagnosis not present

## 2020-07-19 DIAGNOSIS — Z992 Dependence on renal dialysis: Secondary | ICD-10-CM | POA: Diagnosis not present

## 2020-07-19 DIAGNOSIS — D631 Anemia in chronic kidney disease: Secondary | ICD-10-CM | POA: Diagnosis not present

## 2020-07-19 DIAGNOSIS — D509 Iron deficiency anemia, unspecified: Secondary | ICD-10-CM | POA: Diagnosis not present

## 2020-07-19 DIAGNOSIS — N186 End stage renal disease: Secondary | ICD-10-CM | POA: Diagnosis not present

## 2020-07-19 DIAGNOSIS — E8779 Other fluid overload: Secondary | ICD-10-CM | POA: Diagnosis not present

## 2020-07-19 DIAGNOSIS — N2581 Secondary hyperparathyroidism of renal origin: Secondary | ICD-10-CM | POA: Diagnosis not present

## 2020-07-20 DIAGNOSIS — N2581 Secondary hyperparathyroidism of renal origin: Secondary | ICD-10-CM | POA: Diagnosis not present

## 2020-07-20 DIAGNOSIS — D509 Iron deficiency anemia, unspecified: Secondary | ICD-10-CM | POA: Diagnosis not present

## 2020-07-20 DIAGNOSIS — E8779 Other fluid overload: Secondary | ICD-10-CM | POA: Diagnosis not present

## 2020-07-20 DIAGNOSIS — Z992 Dependence on renal dialysis: Secondary | ICD-10-CM | POA: Diagnosis not present

## 2020-07-20 DIAGNOSIS — N186 End stage renal disease: Secondary | ICD-10-CM | POA: Diagnosis not present

## 2020-07-20 DIAGNOSIS — D631 Anemia in chronic kidney disease: Secondary | ICD-10-CM | POA: Diagnosis not present

## 2020-07-20 DIAGNOSIS — R0600 Dyspnea, unspecified: Principal | ICD-10-CM

## 2020-07-20 DIAGNOSIS — I5022 Chronic systolic (congestive) heart failure: Principal | ICD-10-CM

## 2020-07-20 MED ORDER — ERGOCALCIFEROL (VITAMIN D2) 1,250 MCG (50,000 UNIT) CAPSULE
ORAL_CAPSULE | ORAL | 0 refills | 56.00000 days | Status: CP
Start: 2020-07-20 — End: 2020-09-08

## 2020-07-26 DIAGNOSIS — N2581 Secondary hyperparathyroidism of renal origin: Secondary | ICD-10-CM | POA: Diagnosis not present

## 2020-07-26 DIAGNOSIS — Z992 Dependence on renal dialysis: Secondary | ICD-10-CM | POA: Diagnosis not present

## 2020-07-26 DIAGNOSIS — E8779 Other fluid overload: Secondary | ICD-10-CM | POA: Diagnosis not present

## 2020-07-26 DIAGNOSIS — N186 End stage renal disease: Secondary | ICD-10-CM | POA: Diagnosis not present

## 2020-07-26 DIAGNOSIS — D509 Iron deficiency anemia, unspecified: Secondary | ICD-10-CM | POA: Diagnosis not present

## 2020-07-26 DIAGNOSIS — D631 Anemia in chronic kidney disease: Secondary | ICD-10-CM | POA: Diagnosis not present

## 2020-07-27 ENCOUNTER — Ambulatory Visit: Admit: 2020-07-27 | Payer: MEDICARE | Attending: Nurse Practitioner | Primary: Nurse Practitioner

## 2020-07-27 DIAGNOSIS — R0600 Dyspnea, unspecified: Secondary | ICD-10-CM | POA: Diagnosis not present

## 2020-07-27 DIAGNOSIS — N186 End stage renal disease: Secondary | ICD-10-CM | POA: Diagnosis not present

## 2020-07-27 DIAGNOSIS — R058 Other specified cough: Secondary | ICD-10-CM | POA: Diagnosis not present

## 2020-07-27 DIAGNOSIS — N189 Chronic kidney disease, unspecified: Secondary | ICD-10-CM | POA: Diagnosis not present

## 2020-07-27 DIAGNOSIS — I5022 Chronic systolic (congestive) heart failure: Secondary | ICD-10-CM | POA: Diagnosis not present

## 2020-07-27 DIAGNOSIS — U099 Post covid-19 condition, unspecified: Secondary | ICD-10-CM | POA: Diagnosis not present

## 2020-07-27 DIAGNOSIS — Z862 Personal history of diseases of the blood and blood-forming organs and certain disorders involving the immune mechanism: Secondary | ICD-10-CM | POA: Diagnosis not present

## 2020-07-28 DIAGNOSIS — Z992 Dependence on renal dialysis: Secondary | ICD-10-CM | POA: Diagnosis not present

## 2020-07-28 DIAGNOSIS — D631 Anemia in chronic kidney disease: Secondary | ICD-10-CM | POA: Diagnosis not present

## 2020-07-28 DIAGNOSIS — N186 End stage renal disease: Secondary | ICD-10-CM | POA: Diagnosis not present

## 2020-07-28 DIAGNOSIS — E8779 Other fluid overload: Secondary | ICD-10-CM | POA: Diagnosis not present

## 2020-07-28 DIAGNOSIS — N2581 Secondary hyperparathyroidism of renal origin: Secondary | ICD-10-CM | POA: Diagnosis not present

## 2020-07-28 DIAGNOSIS — D509 Iron deficiency anemia, unspecified: Secondary | ICD-10-CM | POA: Diagnosis not present

## 2020-08-02 DIAGNOSIS — D509 Iron deficiency anemia, unspecified: Secondary | ICD-10-CM | POA: Diagnosis not present

## 2020-08-02 DIAGNOSIS — Z992 Dependence on renal dialysis: Secondary | ICD-10-CM | POA: Diagnosis not present

## 2020-08-02 DIAGNOSIS — E8779 Other fluid overload: Secondary | ICD-10-CM | POA: Diagnosis not present

## 2020-08-02 DIAGNOSIS — D631 Anemia in chronic kidney disease: Secondary | ICD-10-CM | POA: Diagnosis not present

## 2020-08-02 DIAGNOSIS — N2581 Secondary hyperparathyroidism of renal origin: Secondary | ICD-10-CM | POA: Diagnosis not present

## 2020-08-02 DIAGNOSIS — N186 End stage renal disease: Secondary | ICD-10-CM | POA: Diagnosis not present

## 2020-08-04 DIAGNOSIS — D631 Anemia in chronic kidney disease: Secondary | ICD-10-CM | POA: Diagnosis not present

## 2020-08-04 DIAGNOSIS — N2581 Secondary hyperparathyroidism of renal origin: Secondary | ICD-10-CM | POA: Diagnosis not present

## 2020-08-04 DIAGNOSIS — Z992 Dependence on renal dialysis: Secondary | ICD-10-CM | POA: Diagnosis not present

## 2020-08-04 DIAGNOSIS — D509 Iron deficiency anemia, unspecified: Secondary | ICD-10-CM | POA: Diagnosis not present

## 2020-08-04 DIAGNOSIS — E8779 Other fluid overload: Secondary | ICD-10-CM | POA: Diagnosis not present

## 2020-08-04 DIAGNOSIS — N186 End stage renal disease: Secondary | ICD-10-CM | POA: Diagnosis not present

## 2020-08-06 DIAGNOSIS — Z992 Dependence on renal dialysis: Secondary | ICD-10-CM | POA: Diagnosis not present

## 2020-08-06 DIAGNOSIS — D631 Anemia in chronic kidney disease: Secondary | ICD-10-CM | POA: Diagnosis not present

## 2020-08-06 DIAGNOSIS — E8779 Other fluid overload: Secondary | ICD-10-CM | POA: Diagnosis not present

## 2020-08-06 DIAGNOSIS — D509 Iron deficiency anemia, unspecified: Secondary | ICD-10-CM | POA: Diagnosis not present

## 2020-08-06 DIAGNOSIS — N186 End stage renal disease: Secondary | ICD-10-CM | POA: Diagnosis not present

## 2020-08-06 DIAGNOSIS — N2581 Secondary hyperparathyroidism of renal origin: Secondary | ICD-10-CM | POA: Diagnosis not present

## 2020-08-07 DIAGNOSIS — N186 End stage renal disease: Secondary | ICD-10-CM | POA: Diagnosis not present

## 2020-08-07 DIAGNOSIS — D631 Anemia in chronic kidney disease: Secondary | ICD-10-CM | POA: Diagnosis not present

## 2020-08-07 DIAGNOSIS — N2581 Secondary hyperparathyroidism of renal origin: Secondary | ICD-10-CM | POA: Diagnosis not present

## 2020-08-07 DIAGNOSIS — E8779 Other fluid overload: Secondary | ICD-10-CM | POA: Diagnosis not present

## 2020-08-07 DIAGNOSIS — Z992 Dependence on renal dialysis: Secondary | ICD-10-CM | POA: Diagnosis not present

## 2020-08-07 DIAGNOSIS — D509 Iron deficiency anemia, unspecified: Secondary | ICD-10-CM | POA: Diagnosis not present

## 2020-08-09 DIAGNOSIS — D631 Anemia in chronic kidney disease: Secondary | ICD-10-CM | POA: Diagnosis not present

## 2020-08-09 DIAGNOSIS — Z992 Dependence on renal dialysis: Secondary | ICD-10-CM | POA: Diagnosis not present

## 2020-08-09 DIAGNOSIS — N186 End stage renal disease: Secondary | ICD-10-CM | POA: Diagnosis not present

## 2020-08-09 DIAGNOSIS — D509 Iron deficiency anemia, unspecified: Secondary | ICD-10-CM | POA: Diagnosis not present

## 2020-08-09 DIAGNOSIS — E8779 Other fluid overload: Secondary | ICD-10-CM | POA: Diagnosis not present

## 2020-08-09 DIAGNOSIS — N2581 Secondary hyperparathyroidism of renal origin: Secondary | ICD-10-CM | POA: Diagnosis not present

## 2020-08-10 ENCOUNTER — Ambulatory Visit (INDEPENDENT_AMBULATORY_CARE_PROVIDER_SITE_OTHER): Payer: Medicare Other | Admitting: Unknown Physician Specialty

## 2020-08-10 ENCOUNTER — Other Ambulatory Visit: Payer: Self-pay

## 2020-08-10 DIAGNOSIS — R053 Chronic cough: Secondary | ICD-10-CM | POA: Insufficient documentation

## 2020-08-10 NOTE — Assessment & Plan Note (Signed)
Pt has not restarted Anoro as recommended by pulmonary.  Sample of Breztri given to see if that helps more.  Sputum culture should be done by pulmonary and encouraged to f/u with them and infectious disease.  Weight loss is concerning.  Get another chest x-ray

## 2020-08-10 NOTE — Progress Notes (Signed)
BP (!) 156/102   Pulse 75   Temp 98.1 F (36.7 C)   Resp 16   Ht '6\' 3"'$  (1.905 m)   Wt 182 lb (82.6 kg)   SpO2 99%   BMI 22.75 kg/m    Subjective:    Patient ID: Louis [name], male    DOB: 1960/04/20, 60 y.o.   MRN: [id number]  HPI: JAQUARIOUS GAYTON is a 60 y.o. male  Chief Complaint  Patient presents with   mucus    Coughing up mucus x1 year   Cough Chronicity: 2 years has had an illness with a daily cough.  Wonders if he needs a sputum culture. The problem has been unchanged. The problem occurs constantly. The cough is Productive of sputum (clear sputum). Associated symptoms include weight loss. Pertinent negatives include no fever. Associated symptoms comments: Lungs are burning in the morning and hurts to cough.  If he does not get dialysis it builds up. Nothing aggravates the symptoms. Treatments tried: Uses a tea but it effects his appetite.  Review of notes show that he was given Juventino Slovak and this was also recommended in March.    Relevant past medical, surgical, family and social history reviewed and updated as indicated. Interim medical history since our last visit reviewed. Allergies and medications reviewed and updated.  Review of Systems  Constitutional:  Positive for weight loss. Negative for fever.  Respiratory:  Positive for cough.    Per HPI unless specifically indicated above     Objective:    BP (!) 156/102   Pulse 75   Temp 98.1 F (36.7 C)   Resp 16   Ht '6\' 3"'$  (1.905 m)   Wt 182 lb (82.6 kg)   SpO2 99%   BMI 22.75 kg/m   Wt Readings from Last 3 Encounters:  08/10/20 182 lb (82.6 kg)  03/16/20 197 lb 6.4 oz (89.5 kg)  11/20/19 189 lb 1.6 oz (85.8 kg)    Physical Exam Constitutional:      General: He is not in acute distress.    Appearance: Normal appearance. He is well-developed.  HENT:     Head: Normocephalic and atraumatic.  Eyes:     General: Lids are normal. No scleral icterus.       Right eye: No discharge.        Left  eye: No discharge.     Conjunctiva/sclera: Conjunctivae normal.  Neck:     Vascular: No carotid bruit or JVD.  Cardiovascular:     Rate and Rhythm: Normal rate and regular rhythm.     Heart sounds: Normal heart sounds.  Pulmonary:     Effort: Pulmonary effort is normal. No respiratory distress.     Breath sounds: Normal breath sounds.  Abdominal:     Palpations: There is no hepatomegaly or splenomegaly.  Musculoskeletal:        General: Normal range of motion.     Cervical back: Normal range of motion and neck supple.  Skin:    General: Skin is warm and dry.     Coloration: Skin is not pale.     Findings: No rash.  Neurological:     Mental Status: He is alert and oriented to person, place, and time.  Psychiatric:        Behavior: Behavior normal.        Thought Content: Thought content normal.        Judgment: Judgment normal.    Results for orders placed or performed  during the hospital encounter of 10/01/19  Potassium  Result Value Ref Range   Potassium 4.7 3.5 - 5.1 mmol/L  Phosphorus  Result Value Ref Range   Phosphorus 4.8 (H) 2.5 - 4.6 mg/dL      Assessment & Plan:   Problem List Items Addressed This Visit       Unprioritized   Chronic cough    Pt has not restarted Anoro as recommended by pulmonary.  Sample of Breztri given to see if that helps more.  Sputum culture should be done by pulmonary and encouraged to f/u with them and infectious disease.  Weight loss is concerning.  Get another chest x-ray       Relevant Orders   DG Chest 2 View     Follow up plan: Return for with pulmonary.

## 2020-08-11 DIAGNOSIS — N2581 Secondary hyperparathyroidism of renal origin: Secondary | ICD-10-CM | POA: Diagnosis not present

## 2020-08-11 DIAGNOSIS — N186 End stage renal disease: Secondary | ICD-10-CM | POA: Diagnosis not present

## 2020-08-11 DIAGNOSIS — D509 Iron deficiency anemia, unspecified: Secondary | ICD-10-CM | POA: Diagnosis not present

## 2020-08-11 DIAGNOSIS — Z992 Dependence on renal dialysis: Secondary | ICD-10-CM | POA: Diagnosis not present

## 2020-08-11 DIAGNOSIS — D631 Anemia in chronic kidney disease: Secondary | ICD-10-CM | POA: Diagnosis not present

## 2020-08-11 DIAGNOSIS — E8779 Other fluid overload: Secondary | ICD-10-CM | POA: Diagnosis not present

## 2020-08-12 ENCOUNTER — Encounter: Admit: 2020-08-12 | Discharge: 2020-08-13 | Payer: MEDICARE

## 2020-08-12 DIAGNOSIS — Z992 Dependence on renal dialysis: Secondary | ICD-10-CM | POA: Diagnosis not present

## 2020-08-12 DIAGNOSIS — N186 End stage renal disease: Secondary | ICD-10-CM | POA: Diagnosis not present

## 2020-08-13 DIAGNOSIS — N186 End stage renal disease: Secondary | ICD-10-CM | POA: Diagnosis not present

## 2020-08-13 DIAGNOSIS — Z992 Dependence on renal dialysis: Secondary | ICD-10-CM | POA: Diagnosis not present

## 2020-08-13 DIAGNOSIS — N2581 Secondary hyperparathyroidism of renal origin: Secondary | ICD-10-CM | POA: Diagnosis not present

## 2020-08-13 DIAGNOSIS — E8779 Other fluid overload: Secondary | ICD-10-CM | POA: Diagnosis not present

## 2020-08-16 DIAGNOSIS — E8779 Other fluid overload: Secondary | ICD-10-CM | POA: Diagnosis not present

## 2020-08-16 DIAGNOSIS — N2581 Secondary hyperparathyroidism of renal origin: Secondary | ICD-10-CM | POA: Diagnosis not present

## 2020-08-16 DIAGNOSIS — Z992 Dependence on renal dialysis: Secondary | ICD-10-CM | POA: Diagnosis not present

## 2020-08-16 DIAGNOSIS — N186 End stage renal disease: Secondary | ICD-10-CM | POA: Diagnosis not present

## 2020-08-17 ENCOUNTER — Encounter: Admit: 2020-08-17 | Discharge: 2020-08-18 | Payer: MEDICARE

## 2020-08-17 DIAGNOSIS — I5022 Chronic systolic (congestive) heart failure: Secondary | ICD-10-CM | POA: Diagnosis not present

## 2020-08-17 DIAGNOSIS — J811 Chronic pulmonary edema: Secondary | ICD-10-CM | POA: Diagnosis not present

## 2020-08-17 DIAGNOSIS — R0600 Dyspnea, unspecified: Secondary | ICD-10-CM | POA: Diagnosis not present

## 2020-08-17 DIAGNOSIS — I5189 Other ill-defined heart diseases: Secondary | ICD-10-CM | POA: Diagnosis not present

## 2020-08-17 DIAGNOSIS — I517 Cardiomegaly: Secondary | ICD-10-CM | POA: Diagnosis not present

## 2020-08-18 DIAGNOSIS — E8779 Other fluid overload: Secondary | ICD-10-CM | POA: Diagnosis not present

## 2020-08-18 DIAGNOSIS — Z992 Dependence on renal dialysis: Secondary | ICD-10-CM | POA: Diagnosis not present

## 2020-08-18 DIAGNOSIS — N186 End stage renal disease: Secondary | ICD-10-CM | POA: Diagnosis not present

## 2020-08-18 DIAGNOSIS — N2581 Secondary hyperparathyroidism of renal origin: Secondary | ICD-10-CM | POA: Diagnosis not present

## 2020-08-20 DIAGNOSIS — N2581 Secondary hyperparathyroidism of renal origin: Secondary | ICD-10-CM | POA: Diagnosis not present

## 2020-08-20 DIAGNOSIS — N186 End stage renal disease: Secondary | ICD-10-CM | POA: Diagnosis not present

## 2020-08-20 DIAGNOSIS — Z992 Dependence on renal dialysis: Secondary | ICD-10-CM | POA: Diagnosis not present

## 2020-08-20 DIAGNOSIS — E8779 Other fluid overload: Secondary | ICD-10-CM | POA: Diagnosis not present

## 2020-08-23 DIAGNOSIS — N2581 Secondary hyperparathyroidism of renal origin: Secondary | ICD-10-CM | POA: Diagnosis not present

## 2020-08-23 DIAGNOSIS — Z992 Dependence on renal dialysis: Secondary | ICD-10-CM | POA: Diagnosis not present

## 2020-08-23 DIAGNOSIS — E8779 Other fluid overload: Secondary | ICD-10-CM | POA: Diagnosis not present

## 2020-08-23 DIAGNOSIS — N186 End stage renal disease: Secondary | ICD-10-CM | POA: Diagnosis not present

## 2020-08-25 DIAGNOSIS — N2581 Secondary hyperparathyroidism of renal origin: Secondary | ICD-10-CM | POA: Diagnosis not present

## 2020-08-25 DIAGNOSIS — Z992 Dependence on renal dialysis: Secondary | ICD-10-CM | POA: Diagnosis not present

## 2020-08-25 DIAGNOSIS — E8779 Other fluid overload: Secondary | ICD-10-CM | POA: Diagnosis not present

## 2020-08-25 DIAGNOSIS — N186 End stage renal disease: Secondary | ICD-10-CM | POA: Diagnosis not present

## 2020-08-27 DIAGNOSIS — E8779 Other fluid overload: Secondary | ICD-10-CM | POA: Diagnosis not present

## 2020-08-27 DIAGNOSIS — N2581 Secondary hyperparathyroidism of renal origin: Secondary | ICD-10-CM | POA: Diagnosis not present

## 2020-08-27 DIAGNOSIS — N186 End stage renal disease: Secondary | ICD-10-CM | POA: Diagnosis not present

## 2020-08-27 DIAGNOSIS — Z992 Dependence on renal dialysis: Secondary | ICD-10-CM | POA: Diagnosis not present

## 2020-08-30 DIAGNOSIS — N2581 Secondary hyperparathyroidism of renal origin: Secondary | ICD-10-CM | POA: Diagnosis not present

## 2020-08-30 DIAGNOSIS — E8779 Other fluid overload: Secondary | ICD-10-CM | POA: Diagnosis not present

## 2020-08-30 DIAGNOSIS — Z992 Dependence on renal dialysis: Secondary | ICD-10-CM | POA: Diagnosis not present

## 2020-08-30 DIAGNOSIS — N186 End stage renal disease: Secondary | ICD-10-CM | POA: Diagnosis not present

## 2020-09-01 DIAGNOSIS — N2581 Secondary hyperparathyroidism of renal origin: Secondary | ICD-10-CM | POA: Diagnosis not present

## 2020-09-01 DIAGNOSIS — N186 End stage renal disease: Secondary | ICD-10-CM | POA: Diagnosis not present

## 2020-09-01 DIAGNOSIS — E8779 Other fluid overload: Secondary | ICD-10-CM | POA: Diagnosis not present

## 2020-09-01 DIAGNOSIS — Z992 Dependence on renal dialysis: Secondary | ICD-10-CM | POA: Diagnosis not present

## 2020-09-03 DIAGNOSIS — E8779 Other fluid overload: Secondary | ICD-10-CM | POA: Diagnosis not present

## 2020-09-03 DIAGNOSIS — N2581 Secondary hyperparathyroidism of renal origin: Secondary | ICD-10-CM | POA: Diagnosis not present

## 2020-09-03 DIAGNOSIS — N186 End stage renal disease: Secondary | ICD-10-CM | POA: Diagnosis not present

## 2020-09-03 DIAGNOSIS — Z992 Dependence on renal dialysis: Secondary | ICD-10-CM | POA: Diagnosis not present

## 2020-09-06 DIAGNOSIS — N186 End stage renal disease: Secondary | ICD-10-CM | POA: Diagnosis not present

## 2020-09-06 DIAGNOSIS — N2581 Secondary hyperparathyroidism of renal origin: Secondary | ICD-10-CM | POA: Diagnosis not present

## 2020-09-06 DIAGNOSIS — Z992 Dependence on renal dialysis: Secondary | ICD-10-CM | POA: Diagnosis not present

## 2020-09-06 DIAGNOSIS — E8779 Other fluid overload: Secondary | ICD-10-CM | POA: Diagnosis not present

## 2020-09-08 DIAGNOSIS — Z992 Dependence on renal dialysis: Secondary | ICD-10-CM | POA: Diagnosis not present

## 2020-09-08 DIAGNOSIS — N2581 Secondary hyperparathyroidism of renal origin: Secondary | ICD-10-CM | POA: Diagnosis not present

## 2020-09-08 DIAGNOSIS — E8779 Other fluid overload: Secondary | ICD-10-CM | POA: Diagnosis not present

## 2020-09-08 DIAGNOSIS — N186 End stage renal disease: Secondary | ICD-10-CM | POA: Diagnosis not present

## 2020-09-10 DIAGNOSIS — N2581 Secondary hyperparathyroidism of renal origin: Secondary | ICD-10-CM | POA: Diagnosis not present

## 2020-09-10 DIAGNOSIS — E8779 Other fluid overload: Secondary | ICD-10-CM | POA: Diagnosis not present

## 2020-09-10 DIAGNOSIS — Z992 Dependence on renal dialysis: Secondary | ICD-10-CM | POA: Diagnosis not present

## 2020-09-10 DIAGNOSIS — N186 End stage renal disease: Secondary | ICD-10-CM | POA: Diagnosis not present

## 2020-09-12 DIAGNOSIS — N186 End stage renal disease: Secondary | ICD-10-CM | POA: Diagnosis not present

## 2020-09-12 DIAGNOSIS — Z992 Dependence on renal dialysis: Secondary | ICD-10-CM | POA: Diagnosis not present

## 2020-09-13 DIAGNOSIS — E8779 Other fluid overload: Secondary | ICD-10-CM | POA: Diagnosis not present

## 2020-09-13 DIAGNOSIS — N186 End stage renal disease: Secondary | ICD-10-CM | POA: Diagnosis not present

## 2020-09-13 DIAGNOSIS — Z992 Dependence on renal dialysis: Secondary | ICD-10-CM | POA: Diagnosis not present

## 2020-09-13 DIAGNOSIS — N2581 Secondary hyperparathyroidism of renal origin: Secondary | ICD-10-CM | POA: Diagnosis not present

## 2020-09-14 ENCOUNTER — Other Ambulatory Visit: Payer: Self-pay | Admitting: Family Medicine

## 2020-09-14 DIAGNOSIS — M109 Gout, unspecified: Secondary | ICD-10-CM

## 2020-09-14 NOTE — Telephone Encounter (Signed)
Copied from [place] [id number]. Topic: Quick Communication - Rx Refill/Question >> Sep 14, 2020  8:54 AM Tessa Lerner A wrote: Medication: predniSONE (DELTASONE) 10 MG tablet   Has the patient contacted their pharmacy? Yes.   (Agent: If no, request that the patient contact the pharmacy for the refill.) (Agent: If yes, when and what did the pharmacy advise?)  Preferred Pharmacy (with phone number or street name): [id number] [address]  Phone:  [phone]:  [phone] Agent: Please be advised that RX refills may take up to 3 business days. We ask that you follow-up with your pharmacy.

## 2020-09-15 ENCOUNTER — Other Ambulatory Visit: Payer: Self-pay | Admitting: Family Medicine

## 2020-09-15 DIAGNOSIS — Z992 Dependence on renal dialysis: Secondary | ICD-10-CM | POA: Diagnosis not present

## 2020-09-15 DIAGNOSIS — M109 Gout, unspecified: Secondary | ICD-10-CM

## 2020-09-15 DIAGNOSIS — N2581 Secondary hyperparathyroidism of renal origin: Secondary | ICD-10-CM | POA: Diagnosis not present

## 2020-09-15 DIAGNOSIS — N186 End stage renal disease: Secondary | ICD-10-CM | POA: Diagnosis not present

## 2020-09-15 DIAGNOSIS — E8779 Other fluid overload: Secondary | ICD-10-CM | POA: Diagnosis not present

## 2020-09-15 MED ORDER — ERGOCALCIFEROL (VITAMIN D2) 1,250 MCG (50,000 UNIT) CAPSULE
ORAL_CAPSULE | ORAL | 0 refills | 56.00000 days | Status: CP
Start: 2020-09-15 — End: 2020-11-04

## 2020-09-16 ENCOUNTER — Other Ambulatory Visit: Payer: Self-pay

## 2020-09-16 ENCOUNTER — Ambulatory Visit (INDEPENDENT_AMBULATORY_CARE_PROVIDER_SITE_OTHER): Payer: Medicare Other | Admitting: Unknown Physician Specialty

## 2020-09-16 ENCOUNTER — Encounter: Payer: Self-pay | Admitting: Unknown Physician Specialty

## 2020-09-16 DIAGNOSIS — M109 Gout, unspecified: Secondary | ICD-10-CM

## 2020-09-16 DIAGNOSIS — M1A00X0 Idiopathic chronic gout, unspecified site, without tophus (tophi): Secondary | ICD-10-CM | POA: Diagnosis not present

## 2020-09-16 MED ORDER — PREDNISONE 10 MG PO TABS: 10.0000 mg | ORAL_TABLET | Freq: Two times a day (BID) | ORAL | 0 refills | Status: AC | PRN

## 2020-09-16 NOTE — Assessment & Plan Note (Signed)
Rx for Prednisone which works well for him.  Last Uric Acid was WNL but does not want to come in today for another.  Had been on Urate lowering therapy but on dialysis and had to come off.  Will treat episodically for now

## 2020-09-16 NOTE — Progress Notes (Signed)
   There were no vitals taken for this visit.   Subjective:    Patient ID: Louis [name], male    DOB: [birth year], 60 y.o.   MRN: [id number]  HPI: Louis [name] is a 60 y.o. male  Chief Complaint  Patient presents with   Gout    Follow up   This visit was completed via telephone due to the restrictions of the COVID-19 pandemic. All issues as above were discussed and addressed but no physical exam was performed. If it was felt that the patient should be evaluated in the office, they were directed there. The patient verbally consented to this visit. Patient was unable to complete an audio/visual visit due to Technical difficulties. Location of the patient: home Location of the provider: work Time spent on call: 20 minutes on call wuith 10 minutes chart review I verified patient identity using two factors (patient name and date of birth). Patient consents verbally to being seen via telemedicine visit today.   Gout Pt states he gets this often. Episodes range from twice a month to maybe every couple of months.  Typically takes Prednisone.  Sxs excrutiating pain, swellin and redness.  States it is in right knee today but comes in different places.  Has been getting for years and frustrated that he is unable to call and get medications.    Review of labs show that Uric acid is WNL  Relevant past medical, surgical, family and social history reviewed and updated as indicated. Interim medical history since our last visit reviewed. Allergies and medications reviewed and updated.  Review of Systems  Per HPI unless specifically indicated above     Objective:    There were no vitals taken for this visit.  Wt Readings from Last 3 Encounters:  08/10/20 182 lb (82.6 kg)  03/16/20 197 lb 6.4 oz (89.5 kg)  11/20/19 189 lb 1.6 oz (85.8 kg)    Physical Exam Neurological:     Mental Status: He is alert and oriented to person, place, and time.  Psychiatric:        Mood and Affect:  Mood normal.    Results for orders placed or performed during the hospital encounter of 10/01/19  Potassium  Result Value Ref Range   Potassium 4.7 3.5 - 5.1 mmol/L  Phosphorus  Result Value Ref Range   Phosphorus 4.8 (H) 2.5 - 4.6 mg/dL      Assessment & Plan:   Problem List Items Addressed This Visit       Unprioritized   Chronic gouty arthropathy without tophi    Rx for Prednisone which works well for him.  Last Uric Acid was WNL but does not want to come in today for another.  Had been on Urate lowering therapy but on dialysis and had to come off.  Will treat episodically for now       Relevant Medications   predniSONE (DELTASONE) 10 MG tablet   RESOLVED: Controlled gout   Relevant Medications   predniSONE (DELTASONE) 10 MG tablet     Follow up plan: Return if symptoms worsen or fail to improve.

## 2020-09-17 DIAGNOSIS — Z992 Dependence on renal dialysis: Secondary | ICD-10-CM | POA: Diagnosis not present

## 2020-09-17 DIAGNOSIS — E8779 Other fluid overload: Secondary | ICD-10-CM | POA: Diagnosis not present

## 2020-09-17 DIAGNOSIS — N2581 Secondary hyperparathyroidism of renal origin: Secondary | ICD-10-CM | POA: Diagnosis not present

## 2020-09-17 DIAGNOSIS — N186 End stage renal disease: Secondary | ICD-10-CM | POA: Diagnosis not present

## 2020-09-20 DIAGNOSIS — E8779 Other fluid overload: Secondary | ICD-10-CM | POA: Diagnosis not present

## 2020-09-20 DIAGNOSIS — N186 End stage renal disease: Secondary | ICD-10-CM | POA: Diagnosis not present

## 2020-09-20 DIAGNOSIS — N2581 Secondary hyperparathyroidism of renal origin: Secondary | ICD-10-CM | POA: Diagnosis not present

## 2020-09-20 DIAGNOSIS — Z992 Dependence on renal dialysis: Secondary | ICD-10-CM | POA: Diagnosis not present

## 2020-09-22 DIAGNOSIS — E8779 Other fluid overload: Secondary | ICD-10-CM | POA: Diagnosis not present

## 2020-09-22 DIAGNOSIS — N186 End stage renal disease: Secondary | ICD-10-CM | POA: Diagnosis not present

## 2020-09-22 DIAGNOSIS — Z992 Dependence on renal dialysis: Secondary | ICD-10-CM | POA: Diagnosis not present

## 2020-09-22 DIAGNOSIS — N2581 Secondary hyperparathyroidism of renal origin: Secondary | ICD-10-CM | POA: Diagnosis not present

## 2020-09-24 DIAGNOSIS — E8779 Other fluid overload: Secondary | ICD-10-CM | POA: Diagnosis not present

## 2020-09-24 DIAGNOSIS — N2581 Secondary hyperparathyroidism of renal origin: Secondary | ICD-10-CM | POA: Diagnosis not present

## 2020-09-24 DIAGNOSIS — N186 End stage renal disease: Secondary | ICD-10-CM | POA: Diagnosis not present

## 2020-09-24 DIAGNOSIS — Z992 Dependence on renal dialysis: Secondary | ICD-10-CM | POA: Diagnosis not present

## 2020-09-27 DIAGNOSIS — E8779 Other fluid overload: Secondary | ICD-10-CM | POA: Diagnosis not present

## 2020-09-27 DIAGNOSIS — Z992 Dependence on renal dialysis: Secondary | ICD-10-CM | POA: Diagnosis not present

## 2020-09-27 DIAGNOSIS — N186 End stage renal disease: Secondary | ICD-10-CM | POA: Diagnosis not present

## 2020-09-27 DIAGNOSIS — N2581 Secondary hyperparathyroidism of renal origin: Secondary | ICD-10-CM | POA: Diagnosis not present

## 2020-09-29 DIAGNOSIS — N186 End stage renal disease: Secondary | ICD-10-CM | POA: Diagnosis not present

## 2020-09-29 DIAGNOSIS — Z992 Dependence on renal dialysis: Secondary | ICD-10-CM | POA: Diagnosis not present

## 2020-09-29 DIAGNOSIS — N2581 Secondary hyperparathyroidism of renal origin: Secondary | ICD-10-CM | POA: Diagnosis not present

## 2020-09-29 DIAGNOSIS — E8779 Other fluid overload: Secondary | ICD-10-CM | POA: Diagnosis not present

## 2020-10-01 DIAGNOSIS — N186 End stage renal disease: Secondary | ICD-10-CM | POA: Diagnosis not present

## 2020-10-01 DIAGNOSIS — E8779 Other fluid overload: Secondary | ICD-10-CM | POA: Diagnosis not present

## 2020-10-01 DIAGNOSIS — Z992 Dependence on renal dialysis: Secondary | ICD-10-CM | POA: Diagnosis not present

## 2020-10-01 DIAGNOSIS — N2581 Secondary hyperparathyroidism of renal origin: Secondary | ICD-10-CM | POA: Diagnosis not present

## 2020-10-04 DIAGNOSIS — N2581 Secondary hyperparathyroidism of renal origin: Secondary | ICD-10-CM | POA: Diagnosis not present

## 2020-10-04 DIAGNOSIS — N186 End stage renal disease: Secondary | ICD-10-CM | POA: Diagnosis not present

## 2020-10-04 DIAGNOSIS — Z992 Dependence on renal dialysis: Secondary | ICD-10-CM | POA: Diagnosis not present

## 2020-10-04 DIAGNOSIS — E8779 Other fluid overload: Secondary | ICD-10-CM | POA: Diagnosis not present

## 2020-10-05 ENCOUNTER — Ambulatory Visit (INDEPENDENT_AMBULATORY_CARE_PROVIDER_SITE_OTHER): Payer: Medicare Other

## 2020-10-05 ENCOUNTER — Other Ambulatory Visit: Payer: Self-pay

## 2020-10-05 VITALS — BP 120/80 | HR 88 | Temp 98.1°F | Resp 15 | Ht 75.0 in | Wt 178.1 lb

## 2020-10-05 DIAGNOSIS — Z0000 Encounter for general adult medical examination without abnormal findings: Secondary | ICD-10-CM | POA: Diagnosis not present

## 2020-10-05 NOTE — Progress Notes (Signed)
Subjective:   Louis [name] is a 60 y.o. male who presents for Medicare Annual/Subsequent preventive examination.  Review of Systems     Cardiac Risk Factors include: advanced age (>41mn, >>56women);male gender;hypertension     Objective:    Today's Vitals   10/05/20 1117 10/05/20 1122  BP: 120/80   Pulse: 88   Resp: 15   Temp: 98.1 F (36.7 C)   TempSrc: Oral   SpO2: 97%   Weight: 178 lb 1.6 oz (80.8 kg)   Height: '6\' 3"'$  (1.905 m)   PainSc:  3    Body mass index is 22.26 kg/m.  Advanced Directives 10/05/2020 11/18/2019 10/02/2019 04/22/2019 03/05/2019 07/05/2017 06/01/2017  Does Patient Have a Medical Advance Directive? No Yes No Yes No No No  Type of Advance Directive - HClymerLiving will - Living will - - -  Does patient want to make changes to medical advance directive? - No - Patient declined - - - - -  Would patient like information on creating a medical advance directive? No - Patient declined - No - Patient declined - No - Patient declined No - Patient declined -    Current Medications (verified) Outpatient Encounter Medications as of 10/05/2020  Medication Sig   AURYXIA 1 GM 210 MG(Fe) tablet Take 1 tablet by mouth 4 (four) times daily -  before meals and at bedtime.   B Complex-C-Folic Acid (RENA-VITE RX) 1 MG TABS Take 1 tablet by mouth daily.   calcitRIOL (ROCALTROL) 0.25 MCG capsule Take by mouth.   clobetasol ointment (TEMOVATE) 0.05 % Apply topically 2 (two) times daily as needed.   DESCOVY 200-25 MG tablet Take 1 tablet by mouth daily.   diclofenac Sodium (VOLTAREN) 1 % GEL Apply 4 g topically 4 (four) times daily.   methocarbamol (ROBAXIN) 500 MG tablet TAKE 1 TABLET (500 MG TOTAL) BY MOUTH 2 (TWO) TIMES DAILY AS NEEDED. FOR PAIN   minoxidil (LONITEN) 2.5 MG tablet Take 2.5 mg by mouth daily.   ondansetron (ZOFRAN-ODT) 4 MG disintegrating tablet Take 4 mg by mouth every 8 (eight) hours as needed.   TIVICAY 50 MG tablet Take 1 tablet  by mouth daily.   valACYclovir (VALTREX) 500 MG tablet Take 500 mg by mouth daily as needed (for flare ups).    predniSONE (DELTASONE) 10 MG tablet Take 1 tablet (10 mg total) by mouth 2 (two) times daily as needed. Prn gout attack for 2 days (Patient not taking: Reported on 10/05/2020)   [DISCONTINUED] budesonide-formoterol (SYMBICORT) 80-4.5 MCG/ACT inhaler Inhale 2 puffs into the lungs 2 (two) times daily.   [DISCONTINUED] pravastatin (PRAVACHOL) 20 MG tablet Take 1 tablet (20 mg total) by mouth daily.   [DISCONTINUED] Tiotropium Bromide Monohydrate (SPIRIVA RESPIMAT) 1.25 MCG/ACT AERS INHALE 2 PUFFS BY MOUTH INTO THE LUNGS DAILY   No facility-administered encounter medications on file as of 10/05/2020.    Allergies (verified) Losartan, Quinapril hcl, and Spironolactone   History: Past Medical History:  Diagnosis Date   Chronic kidney disease    esrd   Dermatophytosis of foot 06/23/2004   Environmental allergies    ESRD (end stage renal disease) on dialysis ([facility]    Gout    [place]   Hypertension    Seborrhea 06/23/2004   Past Surgical History:  Procedure Laterality Date   CAPD INSERTION N/A 12/28/2016   Procedure: LAPAROSCOPIC INSERTION CONTINUOUS AMBULATORY PERITONEAL DIALYSIS  (CAPD) CATHETER;  Surgeon: DAlgernon Huxley MD;  [facility];  Service: Vascular;  Laterality: N/A;   DIALYSIS/PERMA CATHETER INSERTION N/A 10/09/2016   Procedure: DIALYSIS/PERMA CATHETER INSERTION;  Surgeon: Algernon Huxley, MD;  Location: [place] [facility];  Service: Cardiovascular;  Laterality: N/A;   DIALYSIS/PERMA CATHETER REMOVAL N/A 02/19/2017   Procedure: DIALYSIS/PERMA CATHETER REMOVAL;  Surgeon: Algernon Huxley, MD;  Location: [place] [facility];  Service: Cardiovascular;  Laterality: N/A;   HERNIA REPAIR Bilateral    inguinal   Family History  Problem Relation Age of Onset   Bradycardia Maternal Grandmother        with pacemaker   Social History   Socioeconomic  History   Marital status: Single    Spouse name: Not on file   Number of children: 0   Years of education: Not on file   Highest education level: Associate degree: occupational, Hotel manager, or vocational program  Occupational History   Not on file  Tobacco Use   Smoking status: Never   Smokeless tobacco: Never  Vaping Use   Vaping Use: Never used  Substance and Sexual Activity   Alcohol use: Not Currently   Drug use: No   Sexual activity: Yes    Partners: Female    Birth control/protection: Condom  Other Topics Concern   Not on file  Social History Narrative   Pt lives with room mate.    Social Determinants of Health   Financial Resource Strain: Low Risk    Difficulty of Paying Living Expenses: Not very hard  Food Insecurity: No Food Insecurity   Worried About Charity fundraiser in the Last Year: Never true   Ran Out of Food in the Last Year: Never true  Transportation Needs: No Transportation Needs   Lack of Transportation (Medical): No   Lack of Transportation (Non-Medical): No  Physical Activity: Insufficiently Active   Days of Exercise per Week: 2 days   Minutes of Exercise per Session: 30 min  Stress: No Stress Concern Present   Feeling of Stress : Not at all  Social Connections: Moderately Isolated   Frequency of Communication with Friends and Family: More than three times a week   Frequency of Social Gatherings with Friends and Family: More than three times a week   Attends Religious Services: More than 4 times per year   Active Member of [employer] or Organizations: No   Attends Music therapist: Never   Marital Status: Never married    Tobacco Counseling Counseling given: Not Answered   Clinical Intake:  Pre-visit preparation completed: Yes  Pain : 0-10 Pain Score: 3  Pain Type: Acute pain Pain Location: Wrist (arthritis) Pain Orientation: Right Pain Descriptors / Indicators: Aching, Sore Pain Onset: In the past 7 days Pain Frequency:  Intermittent     BMI - recorded: 22.26 Nutritional Status: BMI of 19-24  Normal Nutritional Risks: Nausea/ vomitting/ diarrhea (nausea; medication related) Diabetes: No  How often do you need to have someone help you when you read instructions, pamphlets, or other written materials from your doctor or pharmacy?: 1 - Never    Interpreter Needed?: No  Information entered by :: Clemetine Marker LPN   Activities of Daily Living In your present state of health, do you have any difficulty performing the following activities: 10/05/2020 09/16/2020  Hearing? N N  Vision? N N  Difficulty concentrating or making decisions? N N  Walking or climbing stairs? N N  Dressing or bathing? N N  Doing errands, shopping? N N  Conservation officer, nature and  eating ? N -  Using the Toilet? N -  In the past six months, have you accidently leaked urine? N -  Do you have problems with loss of bowel control? N -  Managing your Medications? N -  Managing your Finances? N -  Housekeeping or managing your Housekeeping? N -  Some recent data might be hidden    Patient Care Team: Steele Sizer, MD as PCP - General (Family Medicine) Baxter Kail, MD (Dermatology) Clarice Pole, MD as Referring Physician (Nephrology) Alwyn Pea, NP as Nurse Practitioner (Infectious Diseases)  Indicate any recent Medical Services you may have received from other than [facility] providers in the past year (date may be approximate).     Assessment:   This is a routine wellness examination for Brelin.  Hearing/Vision screen Hearing Screening - Comments:: Pt denies hearing difficulty Vision Screening - Comments:: Annual vision screenings with [facility]  Dietary issues and exercise activities discussed: Current Exercise Habits: Home exercise routine, Type of exercise: strength training/weights, Time (Minutes): 30, Frequency (Times/Week): 2, Weekly Exercise (Minutes/Week): 60, Intensity: Moderate, Exercise limited by: Other - see  comments (ESRD on dialysis)   Goals Addressed             This Visit's Progress    Patient Stated       Patient states he would like to increase physical activity and get more in shape to overall feel better       Depression Screen PHQ 2/9 Scores 10/05/2020 09/16/2020 08/10/2020 03/16/2020 03/15/2020 11/20/2019 10/02/2019  PHQ - 2 Score 0 0 0 0 0 3 0  PHQ- 9 Score - - - '6 6 5 '$ -    Fall Risk Fall Risk  10/05/2020 09/16/2020 08/10/2020 03/16/2020 03/15/2020  Falls in the past year? 0 0 0 0 0  Number falls in past yr: 0 0 0 0 0  Injury with Fall? 0 0 0 0 0  Comment - - - - -  Risk for fall due to : No Fall Risks - - - -  Follow up Falls prevention discussed Falls evaluation completed - - -    FALL RISK PREVENTION PERTAINING TO THE HOME:  Any stairs in or around the home? Yes  If so, are there any without handrails? No  Home free of loose throw rugs in walkways, pet beds, electrical cords, etc? Yes  Adequate lighting in your home to reduce risk of falls? Yes   ASSISTIVE DEVICES UTILIZED TO PREVENT FALLS:  Life alert? No  Use of a cane, walker or w/c? No  Grab bars in the bathroom? No  Shower chair or bench in shower? No  Elevated toilet seat or a handicapped toilet? Yes   TIMED UP AND GO:  Was the test performed? Yes .  Length of time to ambulate 10 feet: 5 sec.   Gait steady and fast without use of assistive device  Cognitive Function: Normal cognitive status assessed by direct observation by this Nurse Health Advisor. No abnormalities found.          Immunizations Immunization History  Administered Date(s) Administered   Hepatitis A 07/11/2012   Hepatitis A, Adult 07/11/2012, 03/13/2013   Hepatitis B 06/22/2004, 07/11/2012   Hepatitis B, adult 11/07/2012, 03/13/2013, 01/17/2017, 02/18/2018, 06/04/2019, 08/08/2019, 09/03/2019   Influenza Split 11/03/2011   Influenza Whole 01/02/1996   Influenza,inj,Quad PF,6+ Mos 10/05/2017, 12/03/2019   Influenza-Unspecified  10/31/2012, 02/15/2016, 11/28/2016, 03/04/2018, 10/29/2018, 11/13/2018   PFIZER(Purple Top)SARS-COV-2 Vaccination 04/29/2019, 05/20/2019, 12/03/2019   PPD Test 10/08/2016,  02/11/2018, 02/18/2018, 01/20/2019   Pneumococcal Conjugate-13 12/20/2017, 05/10/2018   Pneumococcal Polysaccharide-23 06/22/2004, 02/28/2012, 08/08/2019   Tdap 10/05/2017    TDAP status: Up to date  Flu Vaccine status: Up to date  Pneumococcal vaccine status: Up to date  Covid-19 vaccine status: Completed vaccines  Qualifies for Shingles Vaccine? Yes   Zostavax completed No   Shingrix Completed?: No.    Education has been provided regarding the importance of this vaccine. Patient has been advised to call insurance company to determine out of pocket expense if they have not yet received this vaccine. Advised may also receive vaccine at local pharmacy or Health Dept. Verbalized acceptance and understanding.  Screening Tests Health Maintenance  Topic Date Due   Zoster Vaccines- Shingrix (1 of 2) Never done   COVID-19 Vaccine (4 - Booster for Pfizer series) 03/04/2020   INFLUENZA VACCINE  09/13/2020   COLONOSCOPY (Pts 45-34yr Insurance coverage will need to be confirmed)  06/30/2024   Pneumococcal Vaccine [id number] old (4 - PPSV23 or PCV20) 03/01/2027   TETANUS/TDAP  10/06/2027   Hepatitis C Screening  Completed   HIV Screening  Completed   HPV VACCINES  Aged Out    Health Maintenance  Health Maintenance Due  Topic Date Due   Zoster Vaccines- Shingrix (1 of 2) Never done   COVID-19 Vaccine (4 - Booster for Pfizer series) 03/04/2020   INFLUENZA VACCINE  09/13/2020    Colorectal cancer screening: Type of screening: Colonoscopy. Completed 07/01/19. Repeat every 5 years  Lung Cancer Screening: (Low Dose CT Chest recommended if Age 58-80years, 30 pack-year currently smoking OR have quit w/in 15years.) does not qualify.   Additional Screening:  Hepatitis C Screening: does qualify; Completed  08/23/16  Vision Screening: Recommended annual ophthalmology exams for early detection of glaucoma and other disorders of the eye. Is the patient up to date with their annual eye exam?  Yes  Who is the provider or what is the name of the office in which the patient attends annual eye exams? [facility].   Dental Screening: Recommended annual dental exams for proper oral hygiene  Community Resource Referral / Chronic Care Management: CRR required this visit?  No   CCM required this visit?  No  patient advised he would be a good candidate for CCM services due to medication management and care coordination for continuity of care and multiple specialist providers. Pt declined services at this time.      Plan:     I have personally reviewed and noted the following in the patient's chart:   Medical and social history Use of alcohol, tobacco or illicit drugs  Current medications and supplements including opioid prescriptions. Patient is not currently taking opioid prescriptions. Functional ability and status Nutritional status Physical activity Advanced directives List of other physicians Hospitalizations, surgeries, and ER visits in previous 12 months Vitals Screenings to include cognitive, depression, and falls Referrals and appointments  In addition, I have reviewed and discussed with patient certain preventive protocols, quality metrics, and best practice recommendations. A written personalized care plan for preventive services as well as general preventive health recommendations were provided to patient.     KClemetine Marker LPN   [id number]  Nurse Notes: patient states [facility] infectious disease [facility] and [facility] would [facility] be [facility]. He is interested in replacing the oral medications with an injection he was  told about. Patient was previously managed by  Dr. Ola Spurr prior to his [place] out [place] but now that he is back he would like to see him again. Please advise patient regarding referral.   Patient also requested refill for prednisone to have on hand in case of gout flare. Please send rx to [place].

## 2020-10-05 NOTE — Patient Instructions (Signed)
Mr. Louis [name] , Thank you for taking time to come for your Medicare Wellness Visit. I appreciate your ongoing commitment to your health goals. Please review the following plan we discussed and let me know if I can assist you in the future.   Screening recommendations/referrals: Colonoscopy: done 07/01/19. Repeat 06/2024 Recommended yearly ophthalmology/optometry visit for glaucoma screening and checkup Recommended yearly dental visit for hygiene and checkup  Vaccinations: Influenza vaccine: done 12/03/19 Pneumococcal vaccine: done 08/08/19 Tdap vaccine: done 10/05/17 Shingles vaccine: Shingrix discussed. Please contact your pharmacy for coverage information.  Covid-19:  done 04/29/19, 05/20/19 & 12/03/19  Advanced directives: Advance directive discussed with you today. Even though you declined this today please call our office should you change your mind and we can give you the proper paperwork for you to fill out.   Conditions/risks identified: Recommend increasing activity as tolerated  Next appointment: Follow up in one year for your annual wellness visit.   Preventive Care 58 Years and Older, Male Preventive care refers to lifestyle choices and visits with your health care provider that can promote health and wellness. What does preventive care include? A yearly physical exam. This is also called an annual well check. Dental exams once or twice a year. Routine eye exams. Ask your health care provider how often you should have your eyes checked. Personal lifestyle choices, including: Daily care of your teeth and gums. Regular physical activity. Eating a healthy diet. Avoiding tobacco and drug use. Limiting alcohol use. Practicing safe sex. Taking low doses of aspirin every day. Taking vitamin and mineral supplements as recommended by your health care provider. What happens during an annual well check? The services and screenings done by your health care provider during your annual  well check will depend on your age, overall health, lifestyle risk factors, and family history of disease. Counseling  Your health care provider may ask you questions about your: Alcohol use. Tobacco use. Drug use. Emotional well-being. Home and relationship well-being. Sexual activity. Eating habits. History of falls. Memory and ability to understand (cognition). Work and work Statistician. Screening  You may have the following tests or measurements: Height, weight, and BMI. Blood pressure. Lipid and cholesterol levels. These may be checked every 5 years, or more frequently if you are over 58 years old. Skin check. Lung cancer screening. You may have this screening every year starting at age 58 if you have a 30-pack-year history of smoking and currently smoke or have quit within the past 15 years. Fecal occult blood test (FOBT) of the stool. You may have this test every year starting at age 77. Flexible sigmoidoscopy or colonoscopy. You may have a sigmoidoscopy every 5 years or a colonoscopy every 10 years starting at age 58. Prostate cancer screening. Recommendations will vary depending on your family history and other risks. Hepatitis C blood test. Hepatitis B blood test. Sexually transmitted disease (STD) testing. Diabetes screening. This is done by checking your blood sugar (glucose) after you have not eaten for a while (fasting). You may have this done every 1-3 years. Abdominal aortic aneurysm (AAA) screening. You may need this if you are a current or former smoker. Osteoporosis. You may be screened starting at age 72 if you are at high risk. Talk with your health care provider about your test results, treatment options, and if necessary, the need for more tests. Vaccines  Your health care provider may recommend certain vaccines, such as: Influenza vaccine. This is recommended every year. Tetanus, diphtheria, and acellular pertussis (  Tdap, Td) vaccine. You may need a Td booster  every 10 years. Zoster vaccine. You may need this after age 67. Pneumococcal 13-valent conjugate (PCV13) vaccine. One dose is recommended after age 21. Pneumococcal polysaccharide (PPSV23) vaccine. One dose is recommended after age 75. Talk to your health care provider about which screenings and vaccines you need and how often you need them. This information is not intended to replace advice given to you by your health care provider. Make sure you discuss any questions you have with your health care provider. Document Released: 02/26/2015 Document Revised: 10/20/2015 Document Reviewed: 12/01/2014 Elsevier Interactive Patient Education  2017 [place] Prevention in the Home Falls can cause injuries. They can happen to people of all ages. There are many things you can do to make your home safe and to help prevent falls. What can I do on the outside of my home? Regularly fix the edges of walkways and driveways and fix any cracks. Remove anything that might make you trip as you walk through a door, such as a raised step or threshold. Trim any bushes or trees on the path to your home. Use bright outdoor lighting. Clear any walking paths of anything that might make someone trip, such as rocks or tools. Regularly check to see if handrails are loose or broken. Make sure that both sides of any steps have handrails. Any raised decks and porches should have guardrails on the edges. Have any leaves, snow, or ice cleared regularly. Use sand or salt on walking paths during winter. Clean up any spills in your garage right away. This includes oil or grease spills. What can I do in the bathroom? Use night lights. Install grab bars by the toilet and in the tub and shower. Do not use towel bars as grab bars. Use non-skid mats or decals in the tub or shower. If you need to sit down in the shower, use a plastic, non-slip stool. Keep the floor dry. Clean up any water that spills on the floor as soon  as it happens. Remove soap buildup in the tub or shower regularly. Attach bath mats securely with double-sided non-slip rug tape. Do not have throw rugs and other things on the floor that can make you trip. What can I do in the bedroom? Use night lights. Make sure that you have a light by your bed that is easy to reach. Do not use any sheets or blankets that are too big for your bed. They should not hang down onto the floor. Have a firm chair that has side arms. You can use this for support while you get dressed. Do not have throw rugs and other things on the floor that can make you trip. What can I do in the kitchen? Clean up any spills right away. Avoid walking on wet floors. Keep items that you use a lot in easy-to-reach places. If you need to reach something above you, use a strong step stool that has a grab bar. Keep electrical cords out of the way. Do not use floor polish or wax that makes floors slippery. If you must use wax, use non-skid floor wax. Do not have throw rugs and other things on the floor that can make you trip. What can I do with my stairs? Do not leave any items on the stairs. Make sure that there are handrails on both sides of the stairs and use them. Fix handrails that are broken or loose. Make sure that handrails are  as long as the stairways. Check any carpeting to make sure that it is firmly attached to the stairs. Fix any carpet that is loose or worn. Avoid having throw rugs at the top or bottom of the stairs. If you do have throw rugs, attach them to the floor with carpet tape. Make sure that you have a light switch at the top of the stairs and the bottom of the stairs. If you do not have them, ask someone to add them for you. What else can I do to help prevent falls? Wear shoes that: Do not have high heels. Have rubber bottoms. Are comfortable and fit you well. Are closed at the toe. Do not wear sandals. If you use a stepladder: Make sure that it is fully  opened. Do not climb a closed stepladder. Make sure that both sides of the stepladder are locked into place. Ask someone to hold it for you, if possible. Clearly mark and make sure that you can see: Any grab bars or handrails. First and last steps. Where the edge of each step is. Use tools that help you move around (mobility aids) if they are needed. These include: Canes. Walkers. Scooters. Crutches. Turn on the lights when you go into a dark area. Replace any light bulbs as soon as they burn out. Set up your furniture so you have a clear path. Avoid moving your furniture around. If any of your floors are uneven, fix them. If there are any pets around you, be aware of where they are. Review your medicines with your doctor. Some medicines can make you feel dizzy. This can increase your chance of falling. Ask your doctor what other things that you can do to help prevent falls. This information is not intended to replace advice given to you by your health care provider. Make sure you discuss any questions you have with your health care provider. Document Released: 11/26/2008 Document Revised: 07/08/2015 Document Reviewed: 03/06/2014 Elsevier Interactive Patient Education  2017 [employer].

## 2020-10-06 DIAGNOSIS — N186 End stage renal disease: Secondary | ICD-10-CM | POA: Diagnosis not present

## 2020-10-06 DIAGNOSIS — N2581 Secondary hyperparathyroidism of renal origin: Secondary | ICD-10-CM | POA: Diagnosis not present

## 2020-10-06 DIAGNOSIS — Z992 Dependence on renal dialysis: Secondary | ICD-10-CM | POA: Diagnosis not present

## 2020-10-06 DIAGNOSIS — E8779 Other fluid overload: Secondary | ICD-10-CM | POA: Diagnosis not present

## 2020-10-08 DIAGNOSIS — N186 End stage renal disease: Secondary | ICD-10-CM | POA: Diagnosis not present

## 2020-10-08 DIAGNOSIS — Z992 Dependence on renal dialysis: Secondary | ICD-10-CM | POA: Diagnosis not present

## 2020-10-08 DIAGNOSIS — N2581 Secondary hyperparathyroidism of renal origin: Secondary | ICD-10-CM | POA: Diagnosis not present

## 2020-10-08 DIAGNOSIS — E8779 Other fluid overload: Secondary | ICD-10-CM | POA: Diagnosis not present

## 2020-10-11 DIAGNOSIS — E8779 Other fluid overload: Secondary | ICD-10-CM | POA: Diagnosis not present

## 2020-10-11 DIAGNOSIS — N186 End stage renal disease: Secondary | ICD-10-CM | POA: Diagnosis not present

## 2020-10-11 DIAGNOSIS — N2581 Secondary hyperparathyroidism of renal origin: Secondary | ICD-10-CM | POA: Diagnosis not present

## 2020-10-11 DIAGNOSIS — Z992 Dependence on renal dialysis: Secondary | ICD-10-CM | POA: Diagnosis not present

## 2020-10-13 DIAGNOSIS — Z992 Dependence on renal dialysis: Secondary | ICD-10-CM | POA: Diagnosis not present

## 2020-10-13 DIAGNOSIS — N186 End stage renal disease: Secondary | ICD-10-CM | POA: Diagnosis not present

## 2020-10-13 DIAGNOSIS — E8779 Other fluid overload: Secondary | ICD-10-CM | POA: Diagnosis not present

## 2020-10-13 DIAGNOSIS — N2581 Secondary hyperparathyroidism of renal origin: Secondary | ICD-10-CM | POA: Diagnosis not present

## 2020-10-14 ENCOUNTER — Other Ambulatory Visit: Payer: Self-pay | Admitting: Family Medicine

## 2020-10-14 ENCOUNTER — Telehealth: Payer: Self-pay

## 2020-10-14 DIAGNOSIS — B20 Human immunodeficiency virus [HIV] disease: Secondary | ICD-10-CM

## 2020-10-14 NOTE — Telephone Encounter (Signed)
Copied from [place] [id number]. Topic: Referral - Status >> Oct 14, 2020  1:05 PM Lennox Solders wrote: Reason for CRM: Pt is calling checking on the referral to dr Shanon Brow fitzgerald infection disease phone number [phone]. Dx HIV Pt spoke with kasey at [facility]  10-05-2020

## 2020-10-14 NOTE — Telephone Encounter (Signed)
Dr. Ancil Boozer, please advise. See nurse notes from [place] on 10/05/20.   Thank you!

## 2020-10-15 DIAGNOSIS — D509 Iron deficiency anemia, unspecified: Secondary | ICD-10-CM | POA: Diagnosis not present

## 2020-10-15 DIAGNOSIS — Z992 Dependence on renal dialysis: Secondary | ICD-10-CM | POA: Diagnosis not present

## 2020-10-15 DIAGNOSIS — E8779 Other fluid overload: Secondary | ICD-10-CM | POA: Diagnosis not present

## 2020-10-15 DIAGNOSIS — N2581 Secondary hyperparathyroidism of renal origin: Secondary | ICD-10-CM | POA: Diagnosis not present

## 2020-10-15 DIAGNOSIS — N186 End stage renal disease: Secondary | ICD-10-CM | POA: Diagnosis not present

## 2020-10-18 DIAGNOSIS — E8779 Other fluid overload: Secondary | ICD-10-CM | POA: Diagnosis not present

## 2020-10-18 DIAGNOSIS — D509 Iron deficiency anemia, unspecified: Secondary | ICD-10-CM | POA: Diagnosis not present

## 2020-10-18 DIAGNOSIS — N186 End stage renal disease: Secondary | ICD-10-CM | POA: Diagnosis not present

## 2020-10-18 DIAGNOSIS — N2581 Secondary hyperparathyroidism of renal origin: Secondary | ICD-10-CM | POA: Diagnosis not present

## 2020-10-18 DIAGNOSIS — Z992 Dependence on renal dialysis: Secondary | ICD-10-CM | POA: Diagnosis not present

## 2020-10-20 DIAGNOSIS — E8779 Other fluid overload: Secondary | ICD-10-CM | POA: Diagnosis not present

## 2020-10-20 DIAGNOSIS — D509 Iron deficiency anemia, unspecified: Secondary | ICD-10-CM | POA: Diagnosis not present

## 2020-10-20 DIAGNOSIS — N2581 Secondary hyperparathyroidism of renal origin: Secondary | ICD-10-CM | POA: Diagnosis not present

## 2020-10-20 DIAGNOSIS — Z992 Dependence on renal dialysis: Secondary | ICD-10-CM | POA: Diagnosis not present

## 2020-10-20 DIAGNOSIS — N186 End stage renal disease: Secondary | ICD-10-CM | POA: Diagnosis not present

## 2020-10-22 DIAGNOSIS — N186 End stage renal disease: Secondary | ICD-10-CM | POA: Diagnosis not present

## 2020-10-22 DIAGNOSIS — D509 Iron deficiency anemia, unspecified: Secondary | ICD-10-CM | POA: Diagnosis not present

## 2020-10-22 DIAGNOSIS — E8779 Other fluid overload: Secondary | ICD-10-CM | POA: Diagnosis not present

## 2020-10-22 DIAGNOSIS — N2581 Secondary hyperparathyroidism of renal origin: Secondary | ICD-10-CM | POA: Diagnosis not present

## 2020-10-22 DIAGNOSIS — Z992 Dependence on renal dialysis: Secondary | ICD-10-CM | POA: Diagnosis not present

## 2020-10-25 ENCOUNTER — Ambulatory Visit
Admit: 2020-10-25 | Discharge: 2020-10-27 | Disposition: A | Discharge status: Home / Self Care | Payer: MEDICARE | Admitting: Internal Medicine

## 2020-10-25 ENCOUNTER — Encounter
Admit: 2020-10-25 | Discharge: 2020-10-27 | Disposition: A | Discharge status: Home / Self Care | Payer: MEDICARE | Admitting: Internal Medicine

## 2020-10-25 DIAGNOSIS — D509 Iron deficiency anemia, unspecified: Secondary | ICD-10-CM | POA: Diagnosis not present

## 2020-10-25 DIAGNOSIS — R0602 Shortness of breath: Secondary | ICD-10-CM | POA: Diagnosis not present

## 2020-10-25 DIAGNOSIS — A419 Sepsis, unspecified organism: Secondary | ICD-10-CM | POA: Diagnosis not present

## 2020-10-25 DIAGNOSIS — I120 Hypertensive chronic kidney disease with stage 5 chronic kidney disease or end stage renal disease: Secondary | ICD-10-CM | POA: Diagnosis not present

## 2020-10-25 DIAGNOSIS — N186 End stage renal disease: Secondary | ICD-10-CM | POA: Diagnosis not present

## 2020-10-25 DIAGNOSIS — M109 Gout, unspecified: Secondary | ICD-10-CM | POA: Diagnosis not present

## 2020-10-25 DIAGNOSIS — J984 Other disorders of lung: Secondary | ICD-10-CM | POA: Diagnosis not present

## 2020-10-25 DIAGNOSIS — R918 Other nonspecific abnormal finding of lung field: Secondary | ICD-10-CM | POA: Diagnosis not present

## 2020-10-25 DIAGNOSIS — I5032 Chronic diastolic (congestive) heart failure: Secondary | ICD-10-CM | POA: Diagnosis not present

## 2020-10-25 DIAGNOSIS — R059 Cough, unspecified: Secondary | ICD-10-CM | POA: Diagnosis not present

## 2020-10-25 DIAGNOSIS — I132 Hypertensive heart and chronic kidney disease with heart failure and with stage 5 chronic kidney disease, or end stage renal disease: Secondary | ICD-10-CM | POA: Diagnosis not present

## 2020-10-25 DIAGNOSIS — J9601 Acute respiratory failure with hypoxia: Secondary | ICD-10-CM | POA: Diagnosis not present

## 2020-10-25 DIAGNOSIS — N08 Glomerular disorders in diseases classified elsewhere: Secondary | ICD-10-CM | POA: Diagnosis not present

## 2020-10-25 DIAGNOSIS — J449 Chronic obstructive pulmonary disease, unspecified: Secondary | ICD-10-CM | POA: Diagnosis not present

## 2020-10-25 DIAGNOSIS — I4949 Other premature depolarization: Secondary | ICD-10-CM | POA: Diagnosis not present

## 2020-10-25 DIAGNOSIS — D631 Anemia in chronic kidney disease: Secondary | ICD-10-CM | POA: Diagnosis not present

## 2020-10-25 DIAGNOSIS — U099 Post covid-19 condition, unspecified: Secondary | ICD-10-CM | POA: Diagnosis not present

## 2020-10-25 DIAGNOSIS — G4733 Obstructive sleep apnea (adult) (pediatric): Secondary | ICD-10-CM | POA: Diagnosis not present

## 2020-10-25 DIAGNOSIS — R6520 Severe sepsis without septic shock: Secondary | ICD-10-CM | POA: Diagnosis not present

## 2020-10-25 DIAGNOSIS — Z992 Dependence on renal dialysis: Secondary | ICD-10-CM | POA: Diagnosis not present

## 2020-10-25 DIAGNOSIS — G4736 Sleep related hypoventilation in conditions classified elsewhere: Secondary | ICD-10-CM | POA: Diagnosis not present

## 2020-10-25 DIAGNOSIS — R000 Tachycardia, unspecified: Secondary | ICD-10-CM | POA: Diagnosis not present

## 2020-10-25 DIAGNOSIS — N2581 Secondary hyperparathyroidism of renal origin: Secondary | ICD-10-CM | POA: Diagnosis not present

## 2020-10-25 DIAGNOSIS — E875 Hyperkalemia: Secondary | ICD-10-CM | POA: Diagnosis not present

## 2020-10-25 DIAGNOSIS — Z20822 Contact with and (suspected) exposure to covid-19: Secondary | ICD-10-CM | POA: Diagnosis not present

## 2020-10-25 DIAGNOSIS — E8779 Other fluid overload: Secondary | ICD-10-CM | POA: Diagnosis not present

## 2020-10-27 MED ORDER — SULFAMETHOXAZOLE 400 MG-TRIMETHOPRIM 80 MG TABLET
ORAL_TABLET | ORAL | 2 refills | 28 days | Status: CP
Start: 2020-10-27 — End: 2020-11-26

## 2020-10-28 DIAGNOSIS — N2581 Secondary hyperparathyroidism of renal origin: Secondary | ICD-10-CM | POA: Diagnosis not present

## 2020-10-28 DIAGNOSIS — Z992 Dependence on renal dialysis: Secondary | ICD-10-CM | POA: Diagnosis not present

## 2020-10-28 DIAGNOSIS — D509 Iron deficiency anemia, unspecified: Secondary | ICD-10-CM | POA: Diagnosis not present

## 2020-10-28 DIAGNOSIS — E8779 Other fluid overload: Secondary | ICD-10-CM | POA: Diagnosis not present

## 2020-10-28 DIAGNOSIS — N186 End stage renal disease: Secondary | ICD-10-CM | POA: Diagnosis not present

## 2020-10-28 MED ORDER — AMOXICILLIN 500 MG-POTASSIUM CLAVULANATE 125 MG TABLET
ORAL_TABLET | Freq: Two times a day (BID) | ORAL | 0 refills | 2 days | Status: CP
Start: 2020-10-28 — End: 2020-10-30

## 2020-10-28 MED ORDER — SEVELAMER CARBONATE 800 MG TABLET
ORAL_TABLET | Freq: Three times a day (TID) | ORAL | 11 refills | 30 days | Status: CN
Start: 2020-10-28 — End: 2021-10-28

## 2020-10-29 DIAGNOSIS — Z992 Dependence on renal dialysis: Secondary | ICD-10-CM | POA: Diagnosis not present

## 2020-10-29 DIAGNOSIS — N186 End stage renal disease: Secondary | ICD-10-CM | POA: Diagnosis not present

## 2020-10-29 DIAGNOSIS — D509 Iron deficiency anemia, unspecified: Secondary | ICD-10-CM | POA: Diagnosis not present

## 2020-10-29 DIAGNOSIS — N2581 Secondary hyperparathyroidism of renal origin: Secondary | ICD-10-CM | POA: Diagnosis not present

## 2020-10-29 DIAGNOSIS — E8779 Other fluid overload: Secondary | ICD-10-CM | POA: Diagnosis not present

## 2020-10-29 MED ORDER — PROMETHAZINE 6.25 MG-CODEINE 10 MG/5 ML SYRUP
ORAL | 0 refills | 2.00000 days | Status: CP | PRN
Start: 2020-10-29 — End: 2020-10-29

## 2020-11-01 DIAGNOSIS — N186 End stage renal disease: Secondary | ICD-10-CM | POA: Diagnosis not present

## 2020-11-01 DIAGNOSIS — Z992 Dependence on renal dialysis: Secondary | ICD-10-CM | POA: Diagnosis not present

## 2020-11-01 DIAGNOSIS — D509 Iron deficiency anemia, unspecified: Secondary | ICD-10-CM | POA: Diagnosis not present

## 2020-11-01 DIAGNOSIS — N2581 Secondary hyperparathyroidism of renal origin: Secondary | ICD-10-CM | POA: Diagnosis not present

## 2020-11-01 DIAGNOSIS — E8779 Other fluid overload: Secondary | ICD-10-CM | POA: Diagnosis not present

## 2020-11-01 MED ORDER — PREDNISONE 10 MG TABLET
ORAL_TABLET | ORAL | 0 refills | 6 days | Status: CP
Start: 2020-11-01 — End: 2020-11-07

## 2020-11-02 ENCOUNTER — Telehealth: Payer: Self-pay

## 2020-11-02 NOTE — Telephone Encounter (Signed)
Copied from [place] [phone]. Topic: Referral - Status >> Nov 02, 2020  9:12 AM Lennox Solders wrote: Reason for CRM: Joycelyn Schmid at dr  Delaine Lame ID is calling to let dr Ancil Boozer know she is unable to get in touch with the patient and the patient want to see dr Shanon Brow fitzgerald and dr fitzgerald is not at that location not sure if he is still in town.  Joycelyn Schmid said dr Delaine Lame would be glad to see patient however she has not been able to get in touch with patient. Please advise

## 2020-11-02 NOTE — Telephone Encounter (Signed)
Patient called to say his nephrologist does not want him taking prednisone long-term but he needs something for arthritis and would like to know what you suggest. He stated the voltaren gel does not help.

## 2020-11-02 NOTE — Telephone Encounter (Signed)
Relayed recommendations to patient per Dr. Ancil Boozer.

## 2020-11-03 DIAGNOSIS — D509 Iron deficiency anemia, unspecified: Secondary | ICD-10-CM | POA: Diagnosis not present

## 2020-11-03 DIAGNOSIS — E8779 Other fluid overload: Secondary | ICD-10-CM | POA: Diagnosis not present

## 2020-11-03 DIAGNOSIS — N186 End stage renal disease: Secondary | ICD-10-CM | POA: Diagnosis not present

## 2020-11-03 DIAGNOSIS — N2581 Secondary hyperparathyroidism of renal origin: Secondary | ICD-10-CM | POA: Diagnosis not present

## 2020-11-03 DIAGNOSIS — Z992 Dependence on renal dialysis: Secondary | ICD-10-CM | POA: Diagnosis not present

## 2020-11-05 DIAGNOSIS — E8779 Other fluid overload: Secondary | ICD-10-CM | POA: Diagnosis not present

## 2020-11-05 DIAGNOSIS — Z992 Dependence on renal dialysis: Secondary | ICD-10-CM | POA: Diagnosis not present

## 2020-11-05 DIAGNOSIS — D509 Iron deficiency anemia, unspecified: Secondary | ICD-10-CM | POA: Diagnosis not present

## 2020-11-05 DIAGNOSIS — N186 End stage renal disease: Secondary | ICD-10-CM | POA: Diagnosis not present

## 2020-11-05 DIAGNOSIS — N2581 Secondary hyperparathyroidism of renal origin: Secondary | ICD-10-CM | POA: Diagnosis not present

## 2020-11-08 DIAGNOSIS — D509 Iron deficiency anemia, unspecified: Secondary | ICD-10-CM | POA: Diagnosis not present

## 2020-11-08 DIAGNOSIS — N186 End stage renal disease: Secondary | ICD-10-CM | POA: Diagnosis not present

## 2020-11-08 DIAGNOSIS — E8779 Other fluid overload: Secondary | ICD-10-CM | POA: Diagnosis not present

## 2020-11-08 DIAGNOSIS — N2581 Secondary hyperparathyroidism of renal origin: Secondary | ICD-10-CM | POA: Diagnosis not present

## 2020-11-08 DIAGNOSIS — Z992 Dependence on renal dialysis: Secondary | ICD-10-CM | POA: Diagnosis not present

## 2020-11-10 DIAGNOSIS — E8779 Other fluid overload: Secondary | ICD-10-CM | POA: Diagnosis not present

## 2020-11-10 DIAGNOSIS — N2581 Secondary hyperparathyroidism of renal origin: Secondary | ICD-10-CM | POA: Diagnosis not present

## 2020-11-10 DIAGNOSIS — Z992 Dependence on renal dialysis: Secondary | ICD-10-CM | POA: Diagnosis not present

## 2020-11-10 DIAGNOSIS — N186 End stage renal disease: Secondary | ICD-10-CM | POA: Diagnosis not present

## 2020-11-10 DIAGNOSIS — D509 Iron deficiency anemia, unspecified: Secondary | ICD-10-CM | POA: Diagnosis not present

## 2020-11-12 DIAGNOSIS — Z992 Dependence on renal dialysis: Secondary | ICD-10-CM | POA: Diagnosis not present

## 2020-11-12 DIAGNOSIS — E8779 Other fluid overload: Secondary | ICD-10-CM | POA: Diagnosis not present

## 2020-11-12 DIAGNOSIS — D509 Iron deficiency anemia, unspecified: Secondary | ICD-10-CM | POA: Diagnosis not present

## 2020-11-12 DIAGNOSIS — N186 End stage renal disease: Secondary | ICD-10-CM | POA: Diagnosis not present

## 2020-11-12 DIAGNOSIS — N2581 Secondary hyperparathyroidism of renal origin: Secondary | ICD-10-CM | POA: Diagnosis not present

## 2020-11-15 DIAGNOSIS — D509 Iron deficiency anemia, unspecified: Secondary | ICD-10-CM | POA: Diagnosis not present

## 2020-11-15 DIAGNOSIS — Z992 Dependence on renal dialysis: Secondary | ICD-10-CM | POA: Diagnosis not present

## 2020-11-15 DIAGNOSIS — N2581 Secondary hyperparathyroidism of renal origin: Secondary | ICD-10-CM | POA: Diagnosis not present

## 2020-11-15 DIAGNOSIS — N186 End stage renal disease: Secondary | ICD-10-CM | POA: Diagnosis not present

## 2020-11-16 MED ORDER — PROMETHAZINE 6.25 MG-CODEINE 10 MG/5 ML SYRUP
ORAL | 0 refills | 2 days | Status: CP | PRN
Start: 2020-11-16 — End: ?

## 2020-11-17 DIAGNOSIS — N186 End stage renal disease: Secondary | ICD-10-CM | POA: Diagnosis not present

## 2020-11-17 DIAGNOSIS — Z992 Dependence on renal dialysis: Secondary | ICD-10-CM | POA: Diagnosis not present

## 2020-11-17 DIAGNOSIS — N2581 Secondary hyperparathyroidism of renal origin: Secondary | ICD-10-CM | POA: Diagnosis not present

## 2020-11-17 DIAGNOSIS — D509 Iron deficiency anemia, unspecified: Secondary | ICD-10-CM | POA: Diagnosis not present

## 2020-11-19 DIAGNOSIS — N186 End stage renal disease: Secondary | ICD-10-CM | POA: Diagnosis not present

## 2020-11-19 DIAGNOSIS — N2581 Secondary hyperparathyroidism of renal origin: Secondary | ICD-10-CM | POA: Diagnosis not present

## 2020-11-19 DIAGNOSIS — D509 Iron deficiency anemia, unspecified: Secondary | ICD-10-CM | POA: Diagnosis not present

## 2020-11-19 DIAGNOSIS — Z992 Dependence on renal dialysis: Secondary | ICD-10-CM | POA: Diagnosis not present

## 2020-11-22 DIAGNOSIS — D509 Iron deficiency anemia, unspecified: Secondary | ICD-10-CM | POA: Diagnosis not present

## 2020-11-22 DIAGNOSIS — Z992 Dependence on renal dialysis: Secondary | ICD-10-CM | POA: Diagnosis not present

## 2020-11-22 DIAGNOSIS — N186 End stage renal disease: Secondary | ICD-10-CM | POA: Diagnosis not present

## 2020-11-22 DIAGNOSIS — N2581 Secondary hyperparathyroidism of renal origin: Secondary | ICD-10-CM | POA: Diagnosis not present

## 2020-11-23 ENCOUNTER — Ambulatory Visit: Admit: 2020-11-23 | Discharge: 2020-11-24 | Payer: MEDICARE | Attending: Adult Health | Primary: Adult Health

## 2020-11-23 ENCOUNTER — Telehealth: Payer: Self-pay | Admitting: *Deleted

## 2020-11-23 DIAGNOSIS — G4730 Sleep apnea, unspecified: Secondary | ICD-10-CM | POA: Insufficient documentation

## 2020-11-23 DIAGNOSIS — I5022 Chronic systolic (congestive) heart failure: Secondary | ICD-10-CM | POA: Diagnosis not present

## 2020-11-23 DIAGNOSIS — N186 End stage renal disease: Secondary | ICD-10-CM | POA: Diagnosis not present

## 2020-11-23 MED ORDER — PRAVASTATIN 10 MG TABLET
ORAL_TABLET | Freq: Every day | ORAL | 3 refills | 90 days | Status: CP
Start: 2020-11-23 — End: 2021-11-23

## 2020-11-23 MED ORDER — METOPROLOL TARTRATE 25 MG TABLET
ORAL_TABLET | Freq: Two times a day (BID) | ORAL | 1 refills | 46 days | Status: CP
Start: 2020-11-23 — End: 2021-02-21

## 2020-11-23 NOTE — Chronic Care Management (AMB) (Signed)
  Chronic Care Management   Note  11/23/2020 Name: Louis [name] MRN: [id number] DOB: December 17, 1960  Louis [name] is a 60 y.o. year old male who is a primary care patient of Steele Sizer, MD. I reached out to [name] by phone today in response to a referral sent by Louis [name]'s PCP.  Louis [name] was given information about Chronic Care Management services today including:  CCM service includes personalized support from designated clinical staff supervised by his physician, including individualized plan of care and coordination with other care providers 24/7 contact phone numbers for assistance for urgent and routine care needs. Service will only be billed when office clinical staff spend 20 minutes or more in a month to coordinate care. Only one practitioner may furnish and bill the service in a calendar month. The patient may stop CCM services at any time (effective at the end of the month) by phone call to the office staff. The patient is responsible for co-pay (up to 20% after annual deductible is met) if co-pay is required by the individual health plan.   Patient agreed to services and verbal consent obtained.   Follow up plan: Telephone appointment with care management team member scheduled for: 11/25/2020  Julian Hy, [place] Management  Direct Dial: [phone]

## 2020-11-24 DIAGNOSIS — N186 End stage renal disease: Secondary | ICD-10-CM | POA: Diagnosis not present

## 2020-11-24 DIAGNOSIS — N2581 Secondary hyperparathyroidism of renal origin: Secondary | ICD-10-CM | POA: Diagnosis not present

## 2020-11-24 DIAGNOSIS — D509 Iron deficiency anemia, unspecified: Secondary | ICD-10-CM | POA: Diagnosis not present

## 2020-11-24 DIAGNOSIS — Z992 Dependence on renal dialysis: Secondary | ICD-10-CM | POA: Diagnosis not present

## 2020-11-24 MED ORDER — BENZONATATE 200 MG CAPSULE
ORAL_CAPSULE | Freq: Three times a day (TID) | ORAL | 0 refills | 7 days | Status: CP | PRN
Start: 2020-11-24 — End: 2020-12-01

## 2020-11-25 ENCOUNTER — Ambulatory Visit: Admit: 2020-11-25 | Discharge: 2020-11-25 | Payer: MEDICARE | Attending: Clinical | Primary: Clinical

## 2020-11-25 ENCOUNTER — Ambulatory Visit: Admit: 2020-11-25 | Discharge: 2020-11-25 | Payer: MEDICARE | Attending: Student Health | Primary: Student Health

## 2020-11-25 ENCOUNTER — Telehealth: Payer: Medicare Other

## 2020-11-25 ENCOUNTER — Telehealth: Payer: Self-pay

## 2020-11-25 DIAGNOSIS — Z0000 Encounter for general adult medical examination without abnormal findings: Secondary | ICD-10-CM | POA: Diagnosis not present

## 2020-11-25 DIAGNOSIS — N186 End stage renal disease: Secondary | ICD-10-CM | POA: Diagnosis not present

## 2020-11-25 DIAGNOSIS — I10 Essential (primary) hypertension: Secondary | ICD-10-CM | POA: Diagnosis not present

## 2020-11-25 DIAGNOSIS — B20 Human immunodeficiency virus [HIV] disease: Principal | ICD-10-CM

## 2020-11-25 NOTE — Telephone Encounter (Signed)
  Care Management   Follow Up Note   11/25/2020 Name: [name] MRN: [id number] DOB: [birth year]   Primary Care Provider: Steele Sizer, MD Reason for referral : Chronic Care Management   An unsuccessful telephone outreach was attempted today. The patient was referred to the case management team for assistance with care management and care coordination.   Phone rang multiple times and automatically disconnects. No option to leave a voice message.  Follow Up Plan:  A member of the care management team will attempt to reach [name] again within the next two weeks.   C[facility] Care Management [facility] [phone]

## 2020-11-26 DIAGNOSIS — N186 End stage renal disease: Secondary | ICD-10-CM | POA: Diagnosis not present

## 2020-11-26 DIAGNOSIS — Z992 Dependence on renal dialysis: Secondary | ICD-10-CM | POA: Diagnosis not present

## 2020-11-26 DIAGNOSIS — N2581 Secondary hyperparathyroidism of renal origin: Secondary | ICD-10-CM | POA: Diagnosis not present

## 2020-11-26 DIAGNOSIS — D509 Iron deficiency anemia, unspecified: Secondary | ICD-10-CM | POA: Diagnosis not present

## 2020-11-29 DIAGNOSIS — D509 Iron deficiency anemia, unspecified: Secondary | ICD-10-CM | POA: Diagnosis not present

## 2020-11-29 DIAGNOSIS — N186 End stage renal disease: Secondary | ICD-10-CM | POA: Diagnosis not present

## 2020-11-29 DIAGNOSIS — N2581 Secondary hyperparathyroidism of renal origin: Secondary | ICD-10-CM | POA: Diagnosis not present

## 2020-11-29 DIAGNOSIS — Z992 Dependence on renal dialysis: Secondary | ICD-10-CM | POA: Diagnosis not present

## 2020-12-01 DIAGNOSIS — N2581 Secondary hyperparathyroidism of renal origin: Secondary | ICD-10-CM | POA: Diagnosis not present

## 2020-12-01 DIAGNOSIS — N186 End stage renal disease: Secondary | ICD-10-CM | POA: Diagnosis not present

## 2020-12-01 DIAGNOSIS — Z992 Dependence on renal dialysis: Secondary | ICD-10-CM | POA: Diagnosis not present

## 2020-12-01 DIAGNOSIS — D509 Iron deficiency anemia, unspecified: Secondary | ICD-10-CM | POA: Diagnosis not present

## 2020-12-03 ENCOUNTER — Telehealth: Payer: Self-pay

## 2020-12-03 DIAGNOSIS — Z992 Dependence on renal dialysis: Secondary | ICD-10-CM | POA: Diagnosis not present

## 2020-12-03 DIAGNOSIS — N2581 Secondary hyperparathyroidism of renal origin: Secondary | ICD-10-CM | POA: Diagnosis not present

## 2020-12-03 DIAGNOSIS — N186 End stage renal disease: Secondary | ICD-10-CM | POA: Diagnosis not present

## 2020-12-03 DIAGNOSIS — D509 Iron deficiency anemia, unspecified: Secondary | ICD-10-CM | POA: Diagnosis not present

## 2020-12-03 NOTE — Telephone Encounter (Signed)
  Care Management   Follow Up Note   12/03/2020 Name: Louis [name] MRN: [id number] DOB: Sep 28, 1960   Primary Primary Care: Steele Sizer, MD Reason for referral : Chronic Care Management   An unsuccessful telephone outreach was attempted today. The patient was referred to the case management team for assistance with care management and care coordination.    Follow Up Plan:  Unable to leave a voice message due to patient's voice mailbox being full. A member of the care management team will follow up within the next two weeks.   Cristy [facility] Care Management [facility] [phone]

## 2020-12-06 DIAGNOSIS — D509 Iron deficiency anemia, unspecified: Secondary | ICD-10-CM | POA: Diagnosis not present

## 2020-12-06 DIAGNOSIS — Z992 Dependence on renal dialysis: Secondary | ICD-10-CM | POA: Diagnosis not present

## 2020-12-06 DIAGNOSIS — N2581 Secondary hyperparathyroidism of renal origin: Secondary | ICD-10-CM | POA: Diagnosis not present

## 2020-12-06 DIAGNOSIS — N186 End stage renal disease: Secondary | ICD-10-CM | POA: Diagnosis not present

## 2020-12-08 ENCOUNTER — Ambulatory Visit: Payer: Self-pay

## 2020-12-08 DIAGNOSIS — I5022 Chronic systolic (congestive) heart failure: Secondary | ICD-10-CM

## 2020-12-08 DIAGNOSIS — Z992 Dependence on renal dialysis: Secondary | ICD-10-CM | POA: Diagnosis not present

## 2020-12-08 DIAGNOSIS — N186 End stage renal disease: Secondary | ICD-10-CM | POA: Diagnosis not present

## 2020-12-08 DIAGNOSIS — D509 Iron deficiency anemia, unspecified: Secondary | ICD-10-CM | POA: Diagnosis not present

## 2020-12-08 DIAGNOSIS — N2581 Secondary hyperparathyroidism of renal origin: Secondary | ICD-10-CM | POA: Diagnosis not present

## 2020-12-08 NOTE — Chronic Care Management (AMB) (Signed)
  Chronic Care Management   CCM RN Visit Note  12/08/2020 Name: Louis [name] MRN: [id number] DOB: 1960/10/02  Subjective: Louis [name] is a 60 y.o. year old male who is a primary care patient of Steele Sizer, MD. The care management team was consulted for assistance with disease management and care coordination needs.    [name] was referred to the care management team for assistance with chronic care management and care coordination. His primary care provider will be notified of our unsuccessful attempts to establish and maintain contact. The care management team will gladly outreach at any time in the future if he is interested in receiving assistance.   PLAN The care management team will gladly follow up with [name] after the primary care provider has a conversation with him regarding recommendation for care management engagement and subsequent re-referral for care management services.   [facility]rist[facility] Care Management [facility] [phone]

## 2020-12-10 DIAGNOSIS — D509 Iron deficiency anemia, unspecified: Secondary | ICD-10-CM | POA: Diagnosis not present

## 2020-12-10 DIAGNOSIS — Z992 Dependence on renal dialysis: Secondary | ICD-10-CM | POA: Diagnosis not present

## 2020-12-10 DIAGNOSIS — N2581 Secondary hyperparathyroidism of renal origin: Secondary | ICD-10-CM | POA: Diagnosis not present

## 2020-12-10 DIAGNOSIS — N186 End stage renal disease: Secondary | ICD-10-CM | POA: Diagnosis not present

## 2020-12-13 DIAGNOSIS — Z992 Dependence on renal dialysis: Secondary | ICD-10-CM | POA: Diagnosis not present

## 2020-12-13 DIAGNOSIS — N2581 Secondary hyperparathyroidism of renal origin: Secondary | ICD-10-CM | POA: Diagnosis not present

## 2020-12-13 DIAGNOSIS — D509 Iron deficiency anemia, unspecified: Secondary | ICD-10-CM | POA: Diagnosis not present

## 2020-12-13 DIAGNOSIS — N186 End stage renal disease: Secondary | ICD-10-CM | POA: Diagnosis not present

## 2020-12-15 DIAGNOSIS — D509 Iron deficiency anemia, unspecified: Secondary | ICD-10-CM | POA: Diagnosis not present

## 2020-12-15 DIAGNOSIS — N186 End stage renal disease: Secondary | ICD-10-CM | POA: Diagnosis not present

## 2020-12-15 DIAGNOSIS — Z992 Dependence on renal dialysis: Secondary | ICD-10-CM | POA: Diagnosis not present

## 2020-12-15 DIAGNOSIS — N2581 Secondary hyperparathyroidism of renal origin: Secondary | ICD-10-CM | POA: Diagnosis not present

## 2020-12-17 DIAGNOSIS — D509 Iron deficiency anemia, unspecified: Secondary | ICD-10-CM | POA: Diagnosis not present

## 2020-12-17 DIAGNOSIS — N186 End stage renal disease: Secondary | ICD-10-CM | POA: Diagnosis not present

## 2020-12-17 DIAGNOSIS — Z992 Dependence on renal dialysis: Secondary | ICD-10-CM | POA: Diagnosis not present

## 2020-12-17 DIAGNOSIS — N2581 Secondary hyperparathyroidism of renal origin: Secondary | ICD-10-CM | POA: Diagnosis not present

## 2020-12-20 DIAGNOSIS — D509 Iron deficiency anemia, unspecified: Secondary | ICD-10-CM | POA: Diagnosis not present

## 2020-12-20 DIAGNOSIS — Z992 Dependence on renal dialysis: Secondary | ICD-10-CM | POA: Diagnosis not present

## 2020-12-20 DIAGNOSIS — N186 End stage renal disease: Secondary | ICD-10-CM | POA: Diagnosis not present

## 2020-12-20 DIAGNOSIS — N2581 Secondary hyperparathyroidism of renal origin: Secondary | ICD-10-CM | POA: Diagnosis not present

## 2020-12-22 DIAGNOSIS — Z992 Dependence on renal dialysis: Secondary | ICD-10-CM | POA: Diagnosis not present

## 2020-12-22 DIAGNOSIS — N2581 Secondary hyperparathyroidism of renal origin: Secondary | ICD-10-CM | POA: Diagnosis not present

## 2020-12-22 DIAGNOSIS — N186 End stage renal disease: Secondary | ICD-10-CM | POA: Diagnosis not present

## 2020-12-22 DIAGNOSIS — D509 Iron deficiency anemia, unspecified: Secondary | ICD-10-CM | POA: Diagnosis not present

## 2020-12-24 DIAGNOSIS — N2581 Secondary hyperparathyroidism of renal origin: Secondary | ICD-10-CM | POA: Diagnosis not present

## 2020-12-24 DIAGNOSIS — Z992 Dependence on renal dialysis: Secondary | ICD-10-CM | POA: Diagnosis not present

## 2020-12-24 DIAGNOSIS — N186 End stage renal disease: Secondary | ICD-10-CM | POA: Diagnosis not present

## 2020-12-24 DIAGNOSIS — D509 Iron deficiency anemia, unspecified: Secondary | ICD-10-CM | POA: Diagnosis not present

## 2020-12-27 DIAGNOSIS — N2581 Secondary hyperparathyroidism of renal origin: Secondary | ICD-10-CM | POA: Diagnosis not present

## 2020-12-27 DIAGNOSIS — D509 Iron deficiency anemia, unspecified: Secondary | ICD-10-CM | POA: Diagnosis not present

## 2020-12-27 DIAGNOSIS — N186 End stage renal disease: Secondary | ICD-10-CM | POA: Diagnosis not present

## 2020-12-27 DIAGNOSIS — Z992 Dependence on renal dialysis: Secondary | ICD-10-CM | POA: Diagnosis not present

## 2020-12-29 DIAGNOSIS — N2581 Secondary hyperparathyroidism of renal origin: Secondary | ICD-10-CM | POA: Diagnosis not present

## 2020-12-29 DIAGNOSIS — N186 End stage renal disease: Secondary | ICD-10-CM | POA: Diagnosis not present

## 2020-12-29 DIAGNOSIS — Z992 Dependence on renal dialysis: Secondary | ICD-10-CM | POA: Diagnosis not present

## 2020-12-29 DIAGNOSIS — D509 Iron deficiency anemia, unspecified: Secondary | ICD-10-CM | POA: Diagnosis not present

## 2020-12-31 DIAGNOSIS — N2581 Secondary hyperparathyroidism of renal origin: Secondary | ICD-10-CM | POA: Diagnosis not present

## 2020-12-31 DIAGNOSIS — Z992 Dependence on renal dialysis: Secondary | ICD-10-CM | POA: Diagnosis not present

## 2020-12-31 DIAGNOSIS — D509 Iron deficiency anemia, unspecified: Secondary | ICD-10-CM | POA: Diagnosis not present

## 2020-12-31 DIAGNOSIS — N186 End stage renal disease: Secondary | ICD-10-CM | POA: Diagnosis not present

## 2021-01-03 DIAGNOSIS — D509 Iron deficiency anemia, unspecified: Secondary | ICD-10-CM | POA: Diagnosis not present

## 2021-01-03 DIAGNOSIS — Z992 Dependence on renal dialysis: Secondary | ICD-10-CM | POA: Diagnosis not present

## 2021-01-03 DIAGNOSIS — N2581 Secondary hyperparathyroidism of renal origin: Secondary | ICD-10-CM | POA: Diagnosis not present

## 2021-01-03 DIAGNOSIS — N186 End stage renal disease: Secondary | ICD-10-CM | POA: Diagnosis not present

## 2021-01-05 DIAGNOSIS — Z992 Dependence on renal dialysis: Secondary | ICD-10-CM | POA: Diagnosis not present

## 2021-01-05 DIAGNOSIS — D509 Iron deficiency anemia, unspecified: Secondary | ICD-10-CM | POA: Diagnosis not present

## 2021-01-05 DIAGNOSIS — N186 End stage renal disease: Secondary | ICD-10-CM | POA: Diagnosis not present

## 2021-01-05 DIAGNOSIS — N2581 Secondary hyperparathyroidism of renal origin: Secondary | ICD-10-CM | POA: Diagnosis not present

## 2021-01-07 DIAGNOSIS — Z992 Dependence on renal dialysis: Secondary | ICD-10-CM | POA: Diagnosis not present

## 2021-01-07 DIAGNOSIS — N2581 Secondary hyperparathyroidism of renal origin: Secondary | ICD-10-CM | POA: Diagnosis not present

## 2021-01-07 DIAGNOSIS — D509 Iron deficiency anemia, unspecified: Secondary | ICD-10-CM | POA: Diagnosis not present

## 2021-01-07 DIAGNOSIS — N186 End stage renal disease: Secondary | ICD-10-CM | POA: Diagnosis not present

## 2021-01-10 DIAGNOSIS — N186 End stage renal disease: Secondary | ICD-10-CM | POA: Diagnosis not present

## 2021-01-10 DIAGNOSIS — N2581 Secondary hyperparathyroidism of renal origin: Secondary | ICD-10-CM | POA: Diagnosis not present

## 2021-01-10 DIAGNOSIS — Z992 Dependence on renal dialysis: Secondary | ICD-10-CM | POA: Diagnosis not present

## 2021-01-10 DIAGNOSIS — D509 Iron deficiency anemia, unspecified: Secondary | ICD-10-CM | POA: Diagnosis not present

## 2021-01-12 DIAGNOSIS — N186 End stage renal disease: Secondary | ICD-10-CM | POA: Diagnosis not present

## 2021-01-12 DIAGNOSIS — N2581 Secondary hyperparathyroidism of renal origin: Secondary | ICD-10-CM | POA: Diagnosis not present

## 2021-01-12 DIAGNOSIS — Z992 Dependence on renal dialysis: Secondary | ICD-10-CM | POA: Diagnosis not present

## 2021-01-12 DIAGNOSIS — D509 Iron deficiency anemia, unspecified: Secondary | ICD-10-CM | POA: Diagnosis not present

## 2021-01-14 DIAGNOSIS — Z992 Dependence on renal dialysis: Secondary | ICD-10-CM | POA: Diagnosis not present

## 2021-01-14 DIAGNOSIS — Z23 Encounter for immunization: Secondary | ICD-10-CM | POA: Diagnosis not present

## 2021-01-14 DIAGNOSIS — N2581 Secondary hyperparathyroidism of renal origin: Secondary | ICD-10-CM | POA: Diagnosis not present

## 2021-01-14 DIAGNOSIS — N186 End stage renal disease: Secondary | ICD-10-CM | POA: Diagnosis not present

## 2021-01-14 DIAGNOSIS — D509 Iron deficiency anemia, unspecified: Secondary | ICD-10-CM | POA: Diagnosis not present

## 2021-01-17 DIAGNOSIS — N2581 Secondary hyperparathyroidism of renal origin: Secondary | ICD-10-CM | POA: Diagnosis not present

## 2021-01-17 DIAGNOSIS — Z23 Encounter for immunization: Secondary | ICD-10-CM | POA: Diagnosis not present

## 2021-01-17 DIAGNOSIS — D509 Iron deficiency anemia, unspecified: Secondary | ICD-10-CM | POA: Diagnosis not present

## 2021-01-17 DIAGNOSIS — Z992 Dependence on renal dialysis: Secondary | ICD-10-CM | POA: Diagnosis not present

## 2021-01-17 DIAGNOSIS — N186 End stage renal disease: Secondary | ICD-10-CM | POA: Diagnosis not present

## 2021-01-18 DIAGNOSIS — Z992 Dependence on renal dialysis: Secondary | ICD-10-CM | POA: Diagnosis not present

## 2021-01-18 DIAGNOSIS — Z23 Encounter for immunization: Secondary | ICD-10-CM | POA: Diagnosis not present

## 2021-01-18 DIAGNOSIS — N186 End stage renal disease: Secondary | ICD-10-CM | POA: Diagnosis not present

## 2021-01-18 DIAGNOSIS — D509 Iron deficiency anemia, unspecified: Secondary | ICD-10-CM | POA: Diagnosis not present

## 2021-01-18 DIAGNOSIS — N2581 Secondary hyperparathyroidism of renal origin: Secondary | ICD-10-CM | POA: Diagnosis not present

## 2021-01-19 DIAGNOSIS — N186 End stage renal disease: Secondary | ICD-10-CM | POA: Diagnosis not present

## 2021-01-19 DIAGNOSIS — Z992 Dependence on renal dialysis: Secondary | ICD-10-CM | POA: Diagnosis not present

## 2021-01-19 DIAGNOSIS — N2581 Secondary hyperparathyroidism of renal origin: Secondary | ICD-10-CM | POA: Diagnosis not present

## 2021-01-19 DIAGNOSIS — Z23 Encounter for immunization: Secondary | ICD-10-CM | POA: Diagnosis not present

## 2021-01-19 DIAGNOSIS — D509 Iron deficiency anemia, unspecified: Secondary | ICD-10-CM | POA: Diagnosis not present

## 2021-01-20 ENCOUNTER — Ambulatory Visit: Admit: 2021-01-20 | Discharge: 2021-01-21 | Payer: MEDICARE

## 2021-01-20 DIAGNOSIS — L408 Other psoriasis: Secondary | ICD-10-CM | POA: Diagnosis not present

## 2021-01-20 DIAGNOSIS — L739 Follicular disorder, unspecified: Secondary | ICD-10-CM | POA: Diagnosis not present

## 2021-01-20 MED ORDER — MINOCYCLINE 50 MG CAPSULE
ORAL_CAPSULE | Freq: Two times a day (BID) | ORAL | 2 refills | 30.00000 days | Status: CN
Start: 2021-01-20 — End: 2021-04-20

## 2021-01-20 MED ORDER — TRIAMCINOLONE ACETONIDE 0.1 % TOPICAL OINTMENT
INTRAMUSCULAR | 3 refills | 0.00000 days | Status: CP
Start: 2021-01-20 — End: ?

## 2021-01-20 MED ORDER — TRIAMCINOLONE ACETONIDE 0.1 % TOPICAL CREAM
Freq: Two times a day (BID) | TOPICAL | 0 refills | 0.00000 days | Status: CN
Start: 2021-01-20 — End: 2022-01-20

## 2021-01-20 MED ORDER — DOXYCYCLINE HYCLATE 100 MG TABLET
ORAL_TABLET | 3 refills | 0.00000 days | Status: CP
Start: 2021-01-20 — End: ?

## 2021-01-21 DIAGNOSIS — N186 End stage renal disease: Secondary | ICD-10-CM | POA: Diagnosis not present

## 2021-01-21 DIAGNOSIS — Z23 Encounter for immunization: Secondary | ICD-10-CM | POA: Diagnosis not present

## 2021-01-21 DIAGNOSIS — Z992 Dependence on renal dialysis: Secondary | ICD-10-CM | POA: Diagnosis not present

## 2021-01-21 DIAGNOSIS — D509 Iron deficiency anemia, unspecified: Secondary | ICD-10-CM | POA: Diagnosis not present

## 2021-01-21 DIAGNOSIS — N2581 Secondary hyperparathyroidism of renal origin: Secondary | ICD-10-CM | POA: Diagnosis not present

## 2021-01-24 DIAGNOSIS — D509 Iron deficiency anemia, unspecified: Secondary | ICD-10-CM | POA: Diagnosis not present

## 2021-01-24 DIAGNOSIS — Z992 Dependence on renal dialysis: Secondary | ICD-10-CM | POA: Diagnosis not present

## 2021-01-24 DIAGNOSIS — N186 End stage renal disease: Secondary | ICD-10-CM | POA: Diagnosis not present

## 2021-01-24 DIAGNOSIS — Z23 Encounter for immunization: Secondary | ICD-10-CM | POA: Diagnosis not present

## 2021-01-24 DIAGNOSIS — N2581 Secondary hyperparathyroidism of renal origin: Secondary | ICD-10-CM | POA: Diagnosis not present

## 2021-01-25 DIAGNOSIS — L739 Follicular disorder, unspecified: Principal | ICD-10-CM

## 2021-01-25 MED ORDER — DOXYCYCLINE MONOHYDRATE 100 MG CAPSULE
ORAL_CAPSULE | 2 refills | 0.00000 days | Status: CP
Start: 2021-01-25 — End: ?

## 2021-01-26 DIAGNOSIS — D509 Iron deficiency anemia, unspecified: Secondary | ICD-10-CM | POA: Diagnosis not present

## 2021-01-26 DIAGNOSIS — Z992 Dependence on renal dialysis: Secondary | ICD-10-CM | POA: Diagnosis not present

## 2021-01-26 DIAGNOSIS — Z23 Encounter for immunization: Secondary | ICD-10-CM | POA: Diagnosis not present

## 2021-01-26 DIAGNOSIS — N2581 Secondary hyperparathyroidism of renal origin: Secondary | ICD-10-CM | POA: Diagnosis not present

## 2021-01-26 DIAGNOSIS — N186 End stage renal disease: Secondary | ICD-10-CM | POA: Diagnosis not present

## 2021-01-28 DIAGNOSIS — D509 Iron deficiency anemia, unspecified: Secondary | ICD-10-CM | POA: Diagnosis not present

## 2021-01-28 DIAGNOSIS — N2581 Secondary hyperparathyroidism of renal origin: Secondary | ICD-10-CM | POA: Diagnosis not present

## 2021-01-28 DIAGNOSIS — N186 End stage renal disease: Secondary | ICD-10-CM | POA: Diagnosis not present

## 2021-01-28 DIAGNOSIS — Z23 Encounter for immunization: Secondary | ICD-10-CM | POA: Diagnosis not present

## 2021-01-28 DIAGNOSIS — Z992 Dependence on renal dialysis: Secondary | ICD-10-CM | POA: Diagnosis not present

## 2021-01-31 DIAGNOSIS — Z23 Encounter for immunization: Secondary | ICD-10-CM | POA: Diagnosis not present

## 2021-01-31 DIAGNOSIS — D509 Iron deficiency anemia, unspecified: Secondary | ICD-10-CM | POA: Diagnosis not present

## 2021-01-31 DIAGNOSIS — N186 End stage renal disease: Secondary | ICD-10-CM | POA: Diagnosis not present

## 2021-01-31 DIAGNOSIS — Z992 Dependence on renal dialysis: Secondary | ICD-10-CM | POA: Diagnosis not present

## 2021-01-31 DIAGNOSIS — N2581 Secondary hyperparathyroidism of renal origin: Secondary | ICD-10-CM | POA: Diagnosis not present

## 2021-02-02 DIAGNOSIS — D509 Iron deficiency anemia, unspecified: Secondary | ICD-10-CM | POA: Diagnosis not present

## 2021-02-02 DIAGNOSIS — Z992 Dependence on renal dialysis: Secondary | ICD-10-CM | POA: Diagnosis not present

## 2021-02-02 DIAGNOSIS — Z23 Encounter for immunization: Secondary | ICD-10-CM | POA: Diagnosis not present

## 2021-02-02 DIAGNOSIS — N2581 Secondary hyperparathyroidism of renal origin: Secondary | ICD-10-CM | POA: Diagnosis not present

## 2021-02-02 DIAGNOSIS — N186 End stage renal disease: Secondary | ICD-10-CM | POA: Diagnosis not present

## 2021-02-04 DIAGNOSIS — Z992 Dependence on renal dialysis: Secondary | ICD-10-CM | POA: Diagnosis not present

## 2021-02-04 DIAGNOSIS — Z23 Encounter for immunization: Secondary | ICD-10-CM | POA: Diagnosis not present

## 2021-02-04 DIAGNOSIS — D509 Iron deficiency anemia, unspecified: Secondary | ICD-10-CM | POA: Diagnosis not present

## 2021-02-04 DIAGNOSIS — N2581 Secondary hyperparathyroidism of renal origin: Secondary | ICD-10-CM | POA: Diagnosis not present

## 2021-02-04 DIAGNOSIS — N186 End stage renal disease: Secondary | ICD-10-CM | POA: Diagnosis not present

## 2021-02-07 DIAGNOSIS — Z992 Dependence on renal dialysis: Secondary | ICD-10-CM | POA: Diagnosis not present

## 2021-02-07 DIAGNOSIS — Z23 Encounter for immunization: Secondary | ICD-10-CM | POA: Diagnosis not present

## 2021-02-07 DIAGNOSIS — N186 End stage renal disease: Secondary | ICD-10-CM | POA: Diagnosis not present

## 2021-02-07 DIAGNOSIS — D509 Iron deficiency anemia, unspecified: Secondary | ICD-10-CM | POA: Diagnosis not present

## 2021-02-07 DIAGNOSIS — N2581 Secondary hyperparathyroidism of renal origin: Secondary | ICD-10-CM | POA: Diagnosis not present

## 2021-02-09 DIAGNOSIS — Z23 Encounter for immunization: Secondary | ICD-10-CM | POA: Diagnosis not present

## 2021-02-09 DIAGNOSIS — N186 End stage renal disease: Secondary | ICD-10-CM | POA: Diagnosis not present

## 2021-02-09 DIAGNOSIS — N2581 Secondary hyperparathyroidism of renal origin: Secondary | ICD-10-CM | POA: Diagnosis not present

## 2021-02-09 DIAGNOSIS — D509 Iron deficiency anemia, unspecified: Secondary | ICD-10-CM | POA: Diagnosis not present

## 2021-02-09 DIAGNOSIS — Z992 Dependence on renal dialysis: Secondary | ICD-10-CM | POA: Diagnosis not present

## 2021-02-11 DIAGNOSIS — N2581 Secondary hyperparathyroidism of renal origin: Secondary | ICD-10-CM | POA: Diagnosis not present

## 2021-02-11 DIAGNOSIS — N186 End stage renal disease: Secondary | ICD-10-CM | POA: Diagnosis not present

## 2021-02-11 DIAGNOSIS — D509 Iron deficiency anemia, unspecified: Secondary | ICD-10-CM | POA: Diagnosis not present

## 2021-02-11 DIAGNOSIS — Z23 Encounter for immunization: Secondary | ICD-10-CM | POA: Diagnosis not present

## 2021-02-11 DIAGNOSIS — Z992 Dependence on renal dialysis: Secondary | ICD-10-CM | POA: Diagnosis not present

## 2021-02-12 DIAGNOSIS — N186 End stage renal disease: Secondary | ICD-10-CM | POA: Diagnosis not present

## 2021-02-12 DIAGNOSIS — Z992 Dependence on renal dialysis: Secondary | ICD-10-CM | POA: Diagnosis not present

## 2021-04-19 ENCOUNTER — Ambulatory Visit: Admit: 2021-04-19 | Payer: MEDICARE

## 2021-05-05 DIAGNOSIS — L409 Psoriasis, unspecified: Principal | ICD-10-CM

## 2021-05-05 DIAGNOSIS — L408 Other psoriasis: Principal | ICD-10-CM

## 2021-05-05 DIAGNOSIS — L739 Follicular disorder, unspecified: Principal | ICD-10-CM

## 2021-05-05 MED ORDER — CLOBETASOL 0.05 % TOPICAL OINTMENT
Freq: Two times a day (BID) | TOPICAL | 5 refills | 0 days | Status: CP
Start: 2021-05-05 — End: 2022-05-05

## 2021-05-05 MED ORDER — CLINDAMYCIN PHOSPHATE 1 % TOPICAL SWAB
12 refills | 0 days | Status: CP
Start: 2021-05-05 — End: ?

## 2021-05-05 MED ORDER — TRIAMCINOLONE ACETONIDE 0.1 % TOPICAL OINTMENT
3 refills | 0 days | Status: CP
Start: 2021-05-05 — End: ?

## 2021-05-05 MED ORDER — CALCIPOTRIENE 0.005 % TOPICAL OINTMENT
Freq: Two times a day (BID) | TOPICAL | 5 refills | 0 days | Status: CP
Start: 2021-05-05 — End: 2022-05-05

## 2021-07-14 ENCOUNTER — Other Ambulatory Visit: Payer: Self-pay | Admitting: Family Medicine

## 2021-07-14 DIAGNOSIS — M109 Gout, unspecified: Secondary | ICD-10-CM

## 2021-07-14 NOTE — Telephone Encounter (Signed)
Copied from [place] [id number]. Topic: General - Other >> Jul 14, 2021 10:30 AM Tessa Lerner A wrote: Reason for CRM: Medication Refill - Medication: predniSONE (DELTASONE) 10 MG tablet [[id number]]   Has the patient contacted their pharmacy? No.. This will be the patient's first time using this pharmacy  (Agent: If no, request that the patient contact the pharmacy for the refill. If patient does not wish to contact the pharmacy document the reason why and proceed with request.) (Agent: If yes, when and what did the pharmacy advise?)  Preferred Pharmacy (with phone number or street name): [facility] (N), [place] - [address] [place] ([place]) [place] [address] Phone: [phone] Fax: [phone] Hours: Not open 24 hours  Has the patient been seen for an appointment in the last year OR does the patient have an upcoming appointment? Yes.    Agent: Please be advised that RX refills may take up to 3 business days. We ask that you follow-up with your pharmacy.

## 2021-07-15 NOTE — Telephone Encounter (Signed)
Patient called, left VM to return the call to the office concerning a refill request. If he returns the call, will need to know why he needs the prednisone refilled.

## 2021-07-15 NOTE — Telephone Encounter (Signed)
Called and left a VM to return call regarding medication refill of prednisone.

## 2021-07-18 NOTE — Telephone Encounter (Signed)
FYI Dr Ancil Boozer is not here this week and normally the drs require an appt for antbiotics

## 2021-07-19 DIAGNOSIS — E212 Other hyperparathyroidism: Principal | ICD-10-CM

## 2021-08-04 ENCOUNTER — Ambulatory Visit: Admit: 2021-08-04 | Discharge: 2021-08-05 | Payer: MEDICARE

## 2021-08-04 DIAGNOSIS — L409 Psoriasis, unspecified: Principal | ICD-10-CM

## 2021-08-04 DIAGNOSIS — L739 Follicular disorder, unspecified: Principal | ICD-10-CM

## 2021-08-04 MED ORDER — CLINDAMYCIN PHOSPHATE 1 % TOPICAL SWAB
12 refills | 0.00000 days | Status: CP
Start: 2021-08-04 — End: ?

## 2021-08-04 MED ORDER — DOXYCYCLINE MONOHYDRATE 100 MG CAPSULE
ORAL_CAPSULE | 1 refills | 0.00000 days | Status: CP
Start: 2021-08-04 — End: ?

## 2021-09-20 ENCOUNTER — Ambulatory Visit: Admit: 2021-09-20 | Discharge: 2021-09-21 | Payer: PRIVATE HEALTH INSURANCE

## 2021-09-20 DIAGNOSIS — Z1159 Encounter for screening for other viral diseases: Principal | ICD-10-CM

## 2021-09-20 DIAGNOSIS — N2581 Secondary hyperparathyroidism of renal origin: Principal | ICD-10-CM

## 2021-09-20 DIAGNOSIS — Z114 Encounter for screening for human immunodeficiency virus [HIV]: Principal | ICD-10-CM

## 2021-09-20 DIAGNOSIS — E212 Other hyperparathyroidism: Principal | ICD-10-CM

## 2021-09-20 DIAGNOSIS — N186 End stage renal disease: Principal | ICD-10-CM

## 2021-09-28 DIAGNOSIS — E213 Hyperparathyroidism, unspecified: Principal | ICD-10-CM

## 2021-09-28 MED ORDER — DOXERCALCIFEROL 1 MCG CAPSULE
ORAL_CAPSULE | Freq: Every day | ORAL | 1 refills | 0 days | Status: CP
Start: 2021-09-28 — End: 2022-09-28

## 2021-09-28 MED ORDER — AURYXIA 210 MG IRON TABLET
ORAL_TABLET | Freq: Three times a day (TID) | ORAL | 3 refills | 90.00000 days | Status: CP
Start: 2021-09-28 — End: 2021-09-28

## 2021-10-11 ENCOUNTER — Ambulatory Visit: Payer: Medicare Other

## 2021-12-15 ENCOUNTER — Ambulatory Visit
Admit: 2021-12-15 | Discharge: 2021-12-16 | Payer: PRIVATE HEALTH INSURANCE | Attending: Adult Health | Primary: Adult Health

## 2021-12-15 MED ORDER — AMLODIPINE 10 MG TABLET
ORAL_TABLET | Freq: Every day | ORAL | 3 refills | 90 days | Status: CP
Start: 2021-12-15 — End: 2022-12-15

## 2021-12-15 MED ORDER — PRAVASTATIN 10 MG TABLET
ORAL_TABLET | Freq: Every day | ORAL | 3 refills | 90 days | Status: CP
Start: 2021-12-15 — End: 2022-12-15

## 2021-12-23 DIAGNOSIS — N2581 Secondary hyperparathyroidism of renal origin: Principal | ICD-10-CM

## 2021-12-27 ENCOUNTER — Ambulatory Visit: Admit: 2021-12-27 | Discharge: 2021-12-28 | Payer: PRIVATE HEALTH INSURANCE

## 2021-12-27 DIAGNOSIS — I10 Essential (primary) hypertension: Principal | ICD-10-CM

## 2021-12-27 DIAGNOSIS — N2581 Secondary hyperparathyroidism of renal origin: Principal | ICD-10-CM

## 2021-12-27 DIAGNOSIS — B20 Human immunodeficiency virus [HIV] disease: Principal | ICD-10-CM

## 2021-12-27 DIAGNOSIS — N189 Chronic kidney disease, unspecified: Principal | ICD-10-CM

## 2021-12-27 DIAGNOSIS — N186 End stage renal disease: Principal | ICD-10-CM

## 2021-12-27 DIAGNOSIS — Z862 Personal history of diseases of the blood and blood-forming organs and certain disorders involving the immune mechanism: Principal | ICD-10-CM

## 2021-12-27 DIAGNOSIS — D509 Iron deficiency anemia, unspecified: Principal | ICD-10-CM

## 2021-12-27 DIAGNOSIS — G4730 Sleep apnea, unspecified: Principal | ICD-10-CM

## 2021-12-27 DIAGNOSIS — J432 Centrilobular emphysema: Principal | ICD-10-CM

## 2021-12-27 DIAGNOSIS — I5022 Chronic systolic (congestive) heart failure: Principal | ICD-10-CM

## 2022-01-10 ENCOUNTER — Ambulatory Visit: Admit: 2022-01-10 | Discharge: 2022-01-11 | Payer: PRIVATE HEALTH INSURANCE

## 2022-02-01 DIAGNOSIS — T82858A Stenosis of vascular prosthetic devices, implants and grafts, initial encounter: Principal | ICD-10-CM

## 2022-02-07 DIAGNOSIS — L739 Follicular disorder, unspecified: Principal | ICD-10-CM

## 2022-02-07 MED ORDER — DOXYCYCLINE MONOHYDRATE 100 MG CAPSULE
ORAL_CAPSULE | 0 refills | 0.00000 days | Status: CP
Start: 2022-02-07 — End: ?

## 2022-02-13 DIAGNOSIS — Z23 Encounter for immunization: Secondary | ICD-10-CM | POA: Diagnosis not present

## 2022-02-13 DIAGNOSIS — Z992 Dependence on renal dialysis: Secondary | ICD-10-CM | POA: Diagnosis not present

## 2022-02-13 DIAGNOSIS — N2581 Secondary hyperparathyroidism of renal origin: Secondary | ICD-10-CM | POA: Diagnosis not present

## 2022-02-13 DIAGNOSIS — N186 End stage renal disease: Secondary | ICD-10-CM | POA: Diagnosis not present

## 2022-02-15 DIAGNOSIS — Z23 Encounter for immunization: Secondary | ICD-10-CM | POA: Diagnosis not present

## 2022-02-15 DIAGNOSIS — Z992 Dependence on renal dialysis: Secondary | ICD-10-CM | POA: Diagnosis not present

## 2022-02-15 DIAGNOSIS — N186 End stage renal disease: Secondary | ICD-10-CM | POA: Diagnosis not present

## 2022-02-15 DIAGNOSIS — N2581 Secondary hyperparathyroidism of renal origin: Secondary | ICD-10-CM | POA: Diagnosis not present

## 2022-02-17 DIAGNOSIS — N186 End stage renal disease: Secondary | ICD-10-CM | POA: Diagnosis not present

## 2022-02-17 DIAGNOSIS — N2581 Secondary hyperparathyroidism of renal origin: Secondary | ICD-10-CM | POA: Diagnosis not present

## 2022-02-17 DIAGNOSIS — Z992 Dependence on renal dialysis: Secondary | ICD-10-CM | POA: Diagnosis not present

## 2022-02-17 DIAGNOSIS — Z23 Encounter for immunization: Secondary | ICD-10-CM | POA: Diagnosis not present

## 2022-02-20 DIAGNOSIS — Z23 Encounter for immunization: Secondary | ICD-10-CM | POA: Diagnosis not present

## 2022-02-20 DIAGNOSIS — N2581 Secondary hyperparathyroidism of renal origin: Secondary | ICD-10-CM | POA: Diagnosis not present

## 2022-02-20 DIAGNOSIS — N186 End stage renal disease: Secondary | ICD-10-CM | POA: Diagnosis not present

## 2022-02-20 DIAGNOSIS — Z992 Dependence on renal dialysis: Secondary | ICD-10-CM | POA: Diagnosis not present

## 2022-02-22 DIAGNOSIS — N186 End stage renal disease: Secondary | ICD-10-CM | POA: Diagnosis not present

## 2022-02-22 DIAGNOSIS — Z23 Encounter for immunization: Secondary | ICD-10-CM | POA: Diagnosis not present

## 2022-02-22 DIAGNOSIS — Z992 Dependence on renal dialysis: Secondary | ICD-10-CM | POA: Diagnosis not present

## 2022-02-22 DIAGNOSIS — N2581 Secondary hyperparathyroidism of renal origin: Secondary | ICD-10-CM | POA: Diagnosis not present

## 2022-02-24 DIAGNOSIS — Z23 Encounter for immunization: Secondary | ICD-10-CM | POA: Diagnosis not present

## 2022-02-24 DIAGNOSIS — N186 End stage renal disease: Secondary | ICD-10-CM | POA: Diagnosis not present

## 2022-02-24 DIAGNOSIS — N2581 Secondary hyperparathyroidism of renal origin: Secondary | ICD-10-CM | POA: Diagnosis not present

## 2022-02-24 DIAGNOSIS — Z992 Dependence on renal dialysis: Secondary | ICD-10-CM | POA: Diagnosis not present

## 2022-02-27 DIAGNOSIS — Z23 Encounter for immunization: Secondary | ICD-10-CM | POA: Diagnosis not present

## 2022-02-27 DIAGNOSIS — Z992 Dependence on renal dialysis: Secondary | ICD-10-CM | POA: Diagnosis not present

## 2022-02-27 DIAGNOSIS — N2581 Secondary hyperparathyroidism of renal origin: Secondary | ICD-10-CM | POA: Diagnosis not present

## 2022-02-27 DIAGNOSIS — N186 End stage renal disease: Secondary | ICD-10-CM | POA: Diagnosis not present

## 2022-03-01 DIAGNOSIS — Z992 Dependence on renal dialysis: Secondary | ICD-10-CM | POA: Diagnosis not present

## 2022-03-01 DIAGNOSIS — N2581 Secondary hyperparathyroidism of renal origin: Secondary | ICD-10-CM | POA: Diagnosis not present

## 2022-03-01 DIAGNOSIS — N186 End stage renal disease: Secondary | ICD-10-CM | POA: Diagnosis not present

## 2022-03-01 DIAGNOSIS — Z23 Encounter for immunization: Secondary | ICD-10-CM | POA: Diagnosis not present

## 2022-03-02 ENCOUNTER — Ambulatory Visit: Admit: 2022-03-02 | Discharge: 2022-03-02 | Payer: PRIVATE HEALTH INSURANCE

## 2022-03-02 DIAGNOSIS — T82858A Stenosis of vascular prosthetic devices, implants and grafts, initial encounter: Principal | ICD-10-CM

## 2022-03-03 ENCOUNTER — Ambulatory Visit: Admit: 2022-03-03 | Discharge: 2022-03-04 | Payer: PRIVATE HEALTH INSURANCE

## 2022-03-03 DIAGNOSIS — I5022 Chronic systolic (congestive) heart failure: Secondary | ICD-10-CM | POA: Diagnosis not present

## 2022-03-03 DIAGNOSIS — Z23 Encounter for immunization: Secondary | ICD-10-CM | POA: Diagnosis not present

## 2022-03-03 DIAGNOSIS — I10 Essential (primary) hypertension: Secondary | ICD-10-CM | POA: Diagnosis not present

## 2022-03-03 DIAGNOSIS — I110 Hypertensive heart disease with heart failure: Secondary | ICD-10-CM | POA: Diagnosis not present

## 2022-03-03 DIAGNOSIS — Z992 Dependence on renal dialysis: Secondary | ICD-10-CM | POA: Diagnosis not present

## 2022-03-03 DIAGNOSIS — N186 End stage renal disease: Secondary | ICD-10-CM | POA: Diagnosis not present

## 2022-03-03 DIAGNOSIS — N2581 Secondary hyperparathyroidism of renal origin: Secondary | ICD-10-CM | POA: Diagnosis not present

## 2022-03-06 DIAGNOSIS — N186 End stage renal disease: Secondary | ICD-10-CM | POA: Diagnosis not present

## 2022-03-06 DIAGNOSIS — Z992 Dependence on renal dialysis: Secondary | ICD-10-CM | POA: Diagnosis not present

## 2022-03-06 DIAGNOSIS — N2581 Secondary hyperparathyroidism of renal origin: Secondary | ICD-10-CM | POA: Diagnosis not present

## 2022-03-06 DIAGNOSIS — Z23 Encounter for immunization: Secondary | ICD-10-CM | POA: Diagnosis not present

## 2022-03-07 DIAGNOSIS — N186 End stage renal disease: Secondary | ICD-10-CM | POA: Diagnosis not present

## 2022-03-07 DIAGNOSIS — Z992 Dependence on renal dialysis: Secondary | ICD-10-CM | POA: Diagnosis not present

## 2022-03-07 DIAGNOSIS — Z23 Encounter for immunization: Secondary | ICD-10-CM | POA: Diagnosis not present

## 2022-03-07 DIAGNOSIS — N2581 Secondary hyperparathyroidism of renal origin: Secondary | ICD-10-CM | POA: Diagnosis not present

## 2022-03-08 DIAGNOSIS — N186 End stage renal disease: Secondary | ICD-10-CM | POA: Diagnosis not present

## 2022-03-08 DIAGNOSIS — N2581 Secondary hyperparathyroidism of renal origin: Secondary | ICD-10-CM | POA: Diagnosis not present

## 2022-03-08 DIAGNOSIS — Z23 Encounter for immunization: Secondary | ICD-10-CM | POA: Diagnosis not present

## 2022-03-08 DIAGNOSIS — Z992 Dependence on renal dialysis: Secondary | ICD-10-CM | POA: Diagnosis not present

## 2022-03-10 DIAGNOSIS — N186 End stage renal disease: Secondary | ICD-10-CM | POA: Diagnosis not present

## 2022-03-10 DIAGNOSIS — Z23 Encounter for immunization: Secondary | ICD-10-CM | POA: Diagnosis not present

## 2022-03-10 DIAGNOSIS — N2581 Secondary hyperparathyroidism of renal origin: Secondary | ICD-10-CM | POA: Diagnosis not present

## 2022-03-10 DIAGNOSIS — Z992 Dependence on renal dialysis: Secondary | ICD-10-CM | POA: Diagnosis not present

## 2022-03-13 DIAGNOSIS — Z23 Encounter for immunization: Secondary | ICD-10-CM | POA: Diagnosis not present

## 2022-03-13 DIAGNOSIS — N186 End stage renal disease: Secondary | ICD-10-CM | POA: Diagnosis not present

## 2022-03-13 DIAGNOSIS — Z992 Dependence on renal dialysis: Secondary | ICD-10-CM | POA: Diagnosis not present

## 2022-03-13 DIAGNOSIS — N2581 Secondary hyperparathyroidism of renal origin: Secondary | ICD-10-CM | POA: Diagnosis not present

## 2022-03-15 DIAGNOSIS — N186 End stage renal disease: Secondary | ICD-10-CM | POA: Diagnosis not present

## 2022-03-15 DIAGNOSIS — N2581 Secondary hyperparathyroidism of renal origin: Secondary | ICD-10-CM | POA: Diagnosis not present

## 2022-03-15 DIAGNOSIS — Z992 Dependence on renal dialysis: Secondary | ICD-10-CM | POA: Diagnosis not present

## 2022-03-15 DIAGNOSIS — Z23 Encounter for immunization: Secondary | ICD-10-CM | POA: Diagnosis not present

## 2022-03-16 ENCOUNTER — Ambulatory Visit: Admit: 2022-03-16 | Discharge: 2022-03-17 | Payer: PRIVATE HEALTH INSURANCE

## 2022-03-16 DIAGNOSIS — L409 Psoriasis, unspecified: Secondary | ICD-10-CM | POA: Diagnosis not present

## 2022-03-16 DIAGNOSIS — L739 Follicular disorder, unspecified: Secondary | ICD-10-CM | POA: Diagnosis not present

## 2022-03-16 DIAGNOSIS — L408 Other psoriasis: Secondary | ICD-10-CM | POA: Diagnosis not present

## 2022-03-16 MED ORDER — CLINDAMYCIN PHOSPHATE 1 % TOPICAL SWAB
11 refills | 0.00000 days | Status: CP
Start: 2022-03-16 — End: ?

## 2022-03-16 MED ORDER — DOXYCYCLINE HYCLATE 50 MG TABLET
Freq: Two times a day (BID) | ORAL | 0 refills | 0.00000 days | Status: CN
Start: 2022-03-16 — End: ?

## 2022-03-16 MED ORDER — DOXYCYCLINE HYCLATE 100 MG TABLET
ORAL_TABLET | 11 refills | 0.00000 days | Status: CP
Start: 2022-03-16 — End: ?

## 2022-03-17 DIAGNOSIS — N2581 Secondary hyperparathyroidism of renal origin: Secondary | ICD-10-CM | POA: Diagnosis not present

## 2022-03-17 DIAGNOSIS — N186 End stage renal disease: Secondary | ICD-10-CM | POA: Diagnosis not present

## 2022-03-17 DIAGNOSIS — Z992 Dependence on renal dialysis: Secondary | ICD-10-CM | POA: Diagnosis not present

## 2022-03-20 DIAGNOSIS — Z992 Dependence on renal dialysis: Secondary | ICD-10-CM | POA: Diagnosis not present

## 2022-03-20 DIAGNOSIS — N2581 Secondary hyperparathyroidism of renal origin: Secondary | ICD-10-CM | POA: Diagnosis not present

## 2022-03-20 DIAGNOSIS — N186 End stage renal disease: Secondary | ICD-10-CM | POA: Diagnosis not present

## 2022-03-21 DIAGNOSIS — Z0131 Encounter for examination of blood pressure with abnormal findings: Secondary | ICD-10-CM | POA: Diagnosis not present

## 2022-03-21 DIAGNOSIS — Z0000 Encounter for general adult medical examination without abnormal findings: Secondary | ICD-10-CM | POA: Diagnosis not present

## 2022-03-21 DIAGNOSIS — Z1389 Encounter for screening for other disorder: Secondary | ICD-10-CM | POA: Diagnosis not present

## 2022-03-21 DIAGNOSIS — G4733 Obstructive sleep apnea (adult) (pediatric): Secondary | ICD-10-CM | POA: Diagnosis not present

## 2022-03-21 DIAGNOSIS — D631 Anemia in chronic kidney disease: Secondary | ICD-10-CM | POA: Diagnosis not present

## 2022-03-21 DIAGNOSIS — U099 Post covid-19 condition, unspecified: Secondary | ICD-10-CM | POA: Diagnosis not present

## 2022-03-21 DIAGNOSIS — I5022 Chronic systolic (congestive) heart failure: Secondary | ICD-10-CM | POA: Diagnosis not present

## 2022-03-21 DIAGNOSIS — N186 End stage renal disease: Secondary | ICD-10-CM | POA: Diagnosis not present

## 2022-03-22 DIAGNOSIS — Z992 Dependence on renal dialysis: Secondary | ICD-10-CM | POA: Diagnosis not present

## 2022-03-22 DIAGNOSIS — N186 End stage renal disease: Secondary | ICD-10-CM | POA: Diagnosis not present

## 2022-03-22 DIAGNOSIS — N2581 Secondary hyperparathyroidism of renal origin: Secondary | ICD-10-CM | POA: Diagnosis not present

## 2022-03-24 DIAGNOSIS — N2581 Secondary hyperparathyroidism of renal origin: Secondary | ICD-10-CM | POA: Diagnosis not present

## 2022-03-24 DIAGNOSIS — Z992 Dependence on renal dialysis: Secondary | ICD-10-CM | POA: Diagnosis not present

## 2022-03-24 DIAGNOSIS — N186 End stage renal disease: Secondary | ICD-10-CM | POA: Diagnosis not present

## 2022-03-27 DIAGNOSIS — N2581 Secondary hyperparathyroidism of renal origin: Secondary | ICD-10-CM | POA: Diagnosis not present

## 2022-03-27 DIAGNOSIS — Z992 Dependence on renal dialysis: Secondary | ICD-10-CM | POA: Diagnosis not present

## 2022-03-27 DIAGNOSIS — N186 End stage renal disease: Secondary | ICD-10-CM | POA: Diagnosis not present

## 2022-03-29 DIAGNOSIS — N2581 Secondary hyperparathyroidism of renal origin: Secondary | ICD-10-CM | POA: Diagnosis not present

## 2022-03-29 DIAGNOSIS — Z992 Dependence on renal dialysis: Secondary | ICD-10-CM | POA: Diagnosis not present

## 2022-03-29 DIAGNOSIS — N186 End stage renal disease: Secondary | ICD-10-CM | POA: Diagnosis not present

## 2022-03-31 DIAGNOSIS — N186 End stage renal disease: Secondary | ICD-10-CM | POA: Diagnosis not present

## 2022-03-31 DIAGNOSIS — Z992 Dependence on renal dialysis: Secondary | ICD-10-CM | POA: Diagnosis not present

## 2022-03-31 DIAGNOSIS — N2581 Secondary hyperparathyroidism of renal origin: Secondary | ICD-10-CM | POA: Diagnosis not present

## 2022-04-03 DIAGNOSIS — N186 End stage renal disease: Secondary | ICD-10-CM | POA: Diagnosis not present

## 2022-04-03 DIAGNOSIS — N2581 Secondary hyperparathyroidism of renal origin: Secondary | ICD-10-CM | POA: Diagnosis not present

## 2022-04-03 DIAGNOSIS — Z992 Dependence on renal dialysis: Secondary | ICD-10-CM | POA: Diagnosis not present

## 2022-04-04 ENCOUNTER — Telehealth: Payer: Self-pay | Admitting: Family Medicine

## 2022-04-04 DIAGNOSIS — L988 Other specified disorders of the skin and subcutaneous tissue: Principal | ICD-10-CM

## 2022-04-05 DIAGNOSIS — N186 End stage renal disease: Secondary | ICD-10-CM | POA: Diagnosis not present

## 2022-04-05 DIAGNOSIS — Z992 Dependence on renal dialysis: Secondary | ICD-10-CM | POA: Diagnosis not present

## 2022-04-05 DIAGNOSIS — N2581 Secondary hyperparathyroidism of renal origin: Secondary | ICD-10-CM | POA: Diagnosis not present

## 2022-04-05 NOTE — Telephone Encounter (Signed)
Error

## 2022-04-07 ENCOUNTER — Ambulatory Visit: Admit: 2022-04-07 | Discharge: 2022-04-08 | Payer: PRIVATE HEALTH INSURANCE

## 2022-04-07 DIAGNOSIS — N2581 Secondary hyperparathyroidism of renal origin: Secondary | ICD-10-CM | POA: Diagnosis not present

## 2022-04-07 DIAGNOSIS — I10 Essential (primary) hypertension: Secondary | ICD-10-CM | POA: Diagnosis not present

## 2022-04-07 DIAGNOSIS — N186 End stage renal disease: Secondary | ICD-10-CM | POA: Diagnosis not present

## 2022-04-07 DIAGNOSIS — J432 Centrilobular emphysema: Secondary | ICD-10-CM | POA: Diagnosis not present

## 2022-04-07 DIAGNOSIS — Z992 Dependence on renal dialysis: Secondary | ICD-10-CM | POA: Diagnosis not present

## 2022-04-07 DIAGNOSIS — I5022 Chronic systolic (congestive) heart failure: Secondary | ICD-10-CM | POA: Diagnosis not present

## 2022-04-10 DIAGNOSIS — N186 End stage renal disease: Secondary | ICD-10-CM | POA: Diagnosis not present

## 2022-04-10 DIAGNOSIS — N2581 Secondary hyperparathyroidism of renal origin: Secondary | ICD-10-CM | POA: Diagnosis not present

## 2022-04-10 DIAGNOSIS — Z992 Dependence on renal dialysis: Secondary | ICD-10-CM | POA: Diagnosis not present

## 2022-04-12 DIAGNOSIS — N2581 Secondary hyperparathyroidism of renal origin: Secondary | ICD-10-CM | POA: Diagnosis not present

## 2022-04-12 DIAGNOSIS — Z992 Dependence on renal dialysis: Secondary | ICD-10-CM | POA: Diagnosis not present

## 2022-04-12 DIAGNOSIS — N186 End stage renal disease: Secondary | ICD-10-CM | POA: Diagnosis not present

## 2022-04-13 ENCOUNTER — Ambulatory Visit: Admit: 2022-04-13 | Discharge: 2022-04-14 | Payer: PRIVATE HEALTH INSURANCE

## 2022-04-13 DIAGNOSIS — Z992 Dependence on renal dialysis: Secondary | ICD-10-CM | POA: Diagnosis not present

## 2022-04-13 DIAGNOSIS — T82858A Stenosis of vascular prosthetic devices, implants and grafts, initial encounter: Secondary | ICD-10-CM | POA: Diagnosis not present

## 2022-04-13 DIAGNOSIS — N186 End stage renal disease: Secondary | ICD-10-CM | POA: Diagnosis not present

## 2022-04-25 ENCOUNTER — Encounter: Payer: Self-pay | Admitting: Nurse Practitioner

## 2022-04-25 ENCOUNTER — Ambulatory Visit (INDEPENDENT_AMBULATORY_CARE_PROVIDER_SITE_OTHER): Payer: Medicare HMO | Admitting: Nurse Practitioner

## 2022-04-25 VITALS — BP 152/86 | HR 81 | Ht 74.0 in | Wt 185.0 lb

## 2022-04-25 DIAGNOSIS — I10 Essential (primary) hypertension: Secondary | ICD-10-CM

## 2022-04-25 NOTE — Progress Notes (Signed)
New Patient Office Visit  Subjective:  Patient ID: Louis [name], male    DOB: 07-26-1960  Age: 62 y.o. MRN: [id number]  CC:  Chief Complaint  Patient presents with   [place] term covid symptoms.    HPI Louis [name] is a 62 y.o. male  has a past medical history of Chronic kidney disease, Dermatophytosis of foot (06/23/2004), Environmental allergies, ESRD (end stage renal disease) on dialysis ([place]), Gout, HIV infection ([place]) (12/1995), Hypertension, and Seborrhea (06/23/2004).   Hypertension.  Patient reports that he had [place] in 2020 and ever since then his blood pressure has been elevated sometimes in the 200s.  He has carvedilol ordered but reports that after taking carvedilol 6.25 mg  his blood pressure drops and its makes him feel bad.  He does hemodialysis at home 3 days a week.  He denies chest pain, shortness of breath, cough, dizziness.  Reports that he is anuric.  Patient stated that he was referred to this office to see if his uncontrolled hypertension is due to [place].  His current PCP is at [facility] in [place], is also followed by cardiology and nephrology.  I discussed with the patient that I will discuss his condition with Tonya NP and that we will get back to him with her recommendations     Past Medical History:  Diagnosis Date   Chronic kidney disease    esrd   Dermatophytosis of foot 06/23/2004   Environmental allergies    ESRD (end stage renal disease) on dialysis ([place])    Gout    HIV infection ([place]) 12/1995   Hypertension    Seborrhea 06/23/2004    Past Surgical History:  Procedure Laterality Date   CAPD INSERTION N/A 12/28/2016   Procedure: LAPAROSCOPIC INSERTION CONTINUOUS AMBULATORY PERITONEAL DIALYSIS  (CAPD) CATHETER;  Surgeon: Algernon Huxley, MD;  Location: [facility] ORS;  Service: Vascular;  Laterality: N/A;   DIALYSIS/PERMA CATHETER INSERTION N/A 10/09/2016   Procedure: DIALYSIS/PERMA CATHETER INSERTION;  Surgeon: Algernon Huxley, MD;  Location: [facility];  Service: Cardiovascular;  Laterality: N/A;   DIALYSIS/PERMA CATHETER REMOVAL N/A 02/19/2017   Procedure: DIALYSIS/PERMA CATHETER REMOVAL;  Surgeon: Algernon Huxley, MD;  Location: [facility];  Service: Cardiovascular;  Laterality: N/A;   HERNIA REPAIR Bilateral    inguinal    Family History  Problem Relation Age of Onset   Bradycardia Maternal Grandmother        with pacemaker    Social History   Socioeconomic History   Marital status: Single    Spouse name: Not on file   Number of children: 0   Years of education: Not on file   Highest education level: Associate degree: occupational, Hotel manager, or vocational program  Occupational History   Not on file  Tobacco Use   Smoking status: Never   Smokeless tobacco: Never  Vaping Use   Vaping Use: Never used  Substance and Sexual Activity   Alcohol use: Not Currently   Drug use: No   Sexual activity: Yes    Partners: Female    Birth control/protection: Condom  Other Topics Concern   Not on file  Social History Narrative   Pt lives with room mate.    Social Determinants of Health   Financial Resource Strain: Low Risk  (10/05/2020)   Overall Financial Resource Strain (CARDIA)    Difficulty of Paying Living Expenses: Not very hard  Food Insecurity: No Food Insecurity (  10/05/2020)   Hunger Vital Sign    Worried About Running Out of Food in the Last Year: Never true    [place] in the Last Year: Never true  Transportation Needs: No Transportation Needs (10/05/2020)   PRAPARE - Hydrologist (Medical): No    Lack of Transportation (Non-Medical): No  Physical Activity: Insufficiently Active (10/05/2020)   Exercise Vital Sign    Days of Exercise per Week: 2 days    Minutes of Exercise per Session: 30 min  Stress: No Stress Concern Present (10/05/2020)   [place]    Feeling of  Stress : Not at all  Social Connections: Moderately Isolated (10/05/2020)   Social Connection and Isolation Panel [NHANES]    Frequency of Communication with Friends and Family: More than three times a week    Frequency of Social Gatherings with Friends and Family: More than three times a week    Attends Religious Services: More than 4 times per year    Active Member of [employer] or Organizations: No    Attends Archivist Meetings: Never    Marital Status: Never married  Intimate Partner Violence: Not At Risk (10/05/2020)   Humiliation, Afraid, Rape, and Kick questionnaire    Fear of Current or Ex-Partner: No    Emotionally Abused: No    Physically Abused: No    Sexually Abused: No    ROS Review of Systems  Constitutional:  Negative for activity change, appetite change, chills, diaphoresis, fatigue and fever.  HENT:  Negative for congestion, sinus pressure, sneezing and sore throat.   Respiratory: Negative.  Negative for apnea, cough, choking, chest tightness, shortness of breath and stridor.   Cardiovascular: Negative.  Negative for chest pain, palpitations and leg swelling.  Endocrine: Negative.   Musculoskeletal: Negative.   Neurological: Negative.  Negative for dizziness, facial asymmetry, light-headedness, numbness and headaches.  Psychiatric/Behavioral: Negative.  Negative for agitation, behavioral problems, confusion, decreased concentration and dysphoric mood.     Objective:   Today's Vitals: BP (!) 152/86   Pulse 81   Ht '6\' 2"'$  (1.88 m)   Wt 185 lb (83.9 kg)   SpO2 100%   BMI 23.75 kg/m   Physical Exam Constitutional:      General: He is not in acute distress.    Appearance: Normal appearance. He is not ill-appearing, toxic-appearing or diaphoretic.  Eyes:     General: No scleral icterus.       Right eye: No discharge.        Left eye: No discharge.     Extraocular Movements: Extraocular movements intact.  Cardiovascular:     Rate and Rhythm: Normal rate.      Pulses: Normal pulses.     Heart sounds: Normal heart sounds. No murmur heard.    No friction rub. No gallop.  Pulmonary:     Effort: Pulmonary effort is normal. No respiratory distress.     Breath sounds: Normal breath sounds. No stridor. No wheezing, rhonchi or rales.  Chest:     Chest wall: No tenderness.  Abdominal:     General: There is no distension.     Tenderness: There is no abdominal tenderness. There is no guarding.  Musculoskeletal:        General: No deformity or signs of injury.     Right lower leg: No edema.     Left lower leg: No edema.  Skin:  General: Skin is warm and dry.     Capillary Refill: Capillary refill takes less than 2 seconds.     Coloration: Skin is not jaundiced or pale.     Findings: No bruising.  Neurological:     Mental Status: He is alert and oriented to person, place, and time.     Motor: No weakness.     Coordination: Coordination normal.     Gait: Gait normal.  Psychiatric:        Mood and Affect: Mood normal.        Thought Content: Thought content normal.        Judgment: Judgment normal.     Assessment & Plan:   Problem List Items Addressed This Visit       Cardiovascular and Mediastinum   High blood pressure - Primary    BP Readings from Last 3 Encounters:  04/25/22 (!) 152/86  10/05/20 120/80  08/10/20 (!) 156/102   Hypertension.  Patient reports that he had [place] in 2020 and ever since then his blood pressure has been elevated sometimes in the 200s.  He has carvedilol ordered but reports that after taking carvedilol 6.25 mg  his blood pressure drops and its makes him feel bad.  He does hemodialysis at home 3 days a week.  He denies chest pain, shortness of breath, cough, dizziness.  Reports that he is anuric.  Patient stated that he was referred to this office to see if his uncontrolled hypertension is due to [place].  His current PCP is at [facility] in [place], is also followed by cardiology and nephrology.  I  discussed with the patient that I will discuss his condition with Tonya NP and that we will get back to him with her recommendations       Outpatient Encounter Medications as of 04/25/2022  Medication Sig   AURYXIA 1 GM 210 MG(Fe) tablet Take 1 tablet by mouth 4 (four) times daily -  before meals and at bedtime.   DESCOVY 200-25 MG tablet Take 1 tablet by mouth daily.   methocarbamol (ROBAXIN) 500 MG tablet TAKE 1 TABLET (500 MG TOTAL) BY MOUTH 2 (TWO) TIMES DAILY AS NEEDED. FOR PAIN   ondansetron (ZOFRAN-ODT) 4 MG disintegrating tablet Take 4 mg by mouth every 8 (eight) hours as needed.   valACYclovir (VALTREX) 500 MG tablet Take 500 mg by mouth daily as needed (for flare ups).    B Complex-C-Folic Acid (RENA-VITE RX) 1 MG TABS Take 1 tablet by mouth daily. (Patient not taking: Reported on 04/25/2022)   clobetasol ointment (TEMOVATE) 0.05 % Apply topically 2 (two) times daily as needed.   diclofenac Sodium (VOLTAREN) 1 % GEL Apply 4 g topically 4 (four) times daily. (Patient not taking: Reported on 04/25/2022)   minoxidil (LONITEN) 2.5 MG tablet Take 2.5 mg by mouth daily. (Patient not taking: Reported on 04/25/2022)   predniSONE (DELTASONE) 10 MG tablet Take 1 tablet (10 mg total) by mouth 2 (two) times daily as needed. Prn gout attack for 2 days (Patient not taking: Reported on 10/05/2020)   TIVICAY 50 MG tablet Take 1 tablet by mouth daily. (Patient not taking: Reported on 04/25/2022)   No facility-administered encounter medications on file as of 04/25/2022.    Follow-up: No follow-ups on file.   Renee Rival, FNP

## 2022-04-25 NOTE — Patient Instructions (Signed)
It is important that you exercise regularly at least 30 minutes 5 times a week as tolerated  Think about what you will eat, plan ahead. Choose " clean, green, fresh or frozen" over canned, processed or packaged foods which are more sugary, salty and fatty. 70 to 75% of food eaten should be vegetables and fruit. Three meals at set times with snacks allowed between meals, but they must be fruit or vegetables. Aim to eat over a 12 hour period , example 7 am to 7 pm, and STOP after  your last meal of the day. Drink water,generally every day, no other drink is as healthy. Fruit juice is best enjoyed in a healthy way, by EATING the fruit.  Thanks for choosing [facility] we consider it a privelige to serve you.

## 2022-04-25 NOTE — Assessment & Plan Note (Signed)
BP Readings from Last 3 Encounters:  04/25/22 (!) 152/86  10/05/20 120/80  08/10/20 (!) 156/102   Hypertension.  Patient reports that he had [place] in 2020 and ever since then his blood pressure has been elevated sometimes in the 200s.  He has carvedilol ordered but reports that after taking carvedilol 6.25 mg  his blood pressure drops and its makes him feel bad.  He does hemodialysis at home 3 days a week.  He denies chest pain, shortness of breath, cough, dizziness.  Reports that he is anuric.  Patient stated that he was referred to this office to see if his uncontrolled hypertension is due to [place].  His current PCP is at [facility] in [place], is also followed by cardiology and nephrology.  I discussed with the patient that I will discuss his condition with Tonya NP and that we will get back to him with her recommendations

## 2022-05-01 ENCOUNTER — Telehealth: Payer: Self-pay

## 2022-05-01 NOTE — Telephone Encounter (Signed)
-----   Message from Renee Rival, FNP sent at 04/28/2022  9:45 AM EDT ----- Please call patient and discuss Tonya's recommendations. Thanks  ----- Message ----- From: Fenton Foy, NP Sent: 04/26/2022   1:31 PM EDT To: Renee Rival, FNP  No intervention. Needs to follow with his PCP and cardiology as he is already doing. Thanks.  ----- Message ----- From: Renee Rival, FNP Sent: 04/26/2022   7:55 AM EDT To: Fenton Foy, NP  Good morning Tonya.  Kindly review this patient's chart.  He presented to our office yesterday.  Had [place] in 2020 thinks that he is elevated blood pressure is due to COVID, please let me know if you have any intervention for him.  I told him that you would review his chart and we will  get back to him.   Thank you  Louis [name]. [facility] Male, 62 y.o., 1960-03-08 Pronouns: he/him/his MRN: [id number]

## 2022-05-10 ENCOUNTER — Telehealth: Payer: Self-pay | Admitting: Nurse Practitioner

## 2022-05-10 NOTE — Telephone Encounter (Signed)
Called patient to schedule Medicare Annual Wellness Visit (AWV). Left message for patient to call back and schedule Medicare Annual Wellness Visit (AWV).  Last date of AWV: 10/05/2020   Please schedule an AWVS appointment at any time with [place].  If any questions, please contact me at [phone].   Thank you,  [place] Direct dial  [phone]

## 2022-06-12 ENCOUNTER — Ambulatory Visit
Admit: 2022-06-12 | Discharge: 2022-06-16 | Disposition: A | Discharge status: Home / Self Care | Payer: PRIVATE HEALTH INSURANCE

## 2022-06-12 ENCOUNTER — Encounter
Admit: 2022-06-12 | Discharge: 2022-06-16 | Disposition: A | Discharge status: Home / Self Care | Payer: PRIVATE HEALTH INSURANCE

## 2022-06-12 ENCOUNTER — Encounter
Admit: 2022-06-12 | Discharge: 2022-06-16 | Payer: PRIVATE HEALTH INSURANCE | Attending: Student in an Organized Health Care Education/Training Program

## 2022-06-16 DIAGNOSIS — Z9089 Acquired absence of other organs: Principal | ICD-10-CM

## 2022-06-16 DIAGNOSIS — Z9889 Other specified postprocedural states: Principal | ICD-10-CM

## 2022-06-16 DIAGNOSIS — N186 End stage renal disease: Principal | ICD-10-CM

## 2022-06-16 MED ORDER — CALCIUM 200 MG (AS CALCIUM CARBONATE 500 MG) CHEWABLE TABLET
ORAL_TABLET | Freq: Four times a day (QID) | ORAL | 0 refills | 30 days | Status: CP
Start: 2022-06-16 — End: 2022-07-16

## 2022-06-16 MED ORDER — CALCITRIOL 1 MCG/ML ORAL SOLUTION
Freq: Two times a day (BID) | ORAL | 0 refills | 30 days | Status: CP
Start: 2022-06-16 — End: 2022-07-16

## 2022-06-16 MED ORDER — CALCITRIOL 0.5 MCG CAPSULE
ORAL_CAPSULE | Freq: Two times a day (BID) | ORAL | 3 refills | 90 days | Status: CP
Start: 2022-06-16 — End: 2023-06-11

## 2022-06-17 ENCOUNTER — Ambulatory Visit: Admit: 2022-06-17 | Discharge: 2022-06-18 | Payer: PRIVATE HEALTH INSURANCE

## 2022-06-18 MED ORDER — PREDNISONE 5 MG TABLET
ORAL_TABLET | ORAL | 0 refills | 3.00 days | Status: CP
Start: 2022-06-18 — End: 2022-06-21
  Filled 2022-06-18: qty 5, 3d supply, fill #0

## 2022-06-20 DIAGNOSIS — Z9089 Acquired absence of other organs: Principal | ICD-10-CM

## 2022-06-20 DIAGNOSIS — Z9889 Other specified postprocedural states: Principal | ICD-10-CM

## 2022-06-20 MED ORDER — CALCITRIOL 0.5 MCG CAPSULE
ORAL_CAPSULE | Freq: Two times a day (BID) | ORAL | 3 refills | 90 days | Status: CP
Start: 2022-06-20 — End: 2023-06-15

## 2022-06-21 DIAGNOSIS — Z9889 Other specified postprocedural states: Principal | ICD-10-CM

## 2022-06-21 DIAGNOSIS — Z9089 Acquired absence of other organs: Principal | ICD-10-CM

## 2022-06-21 DIAGNOSIS — J384 Edema of larynx: Principal | ICD-10-CM

## 2022-06-21 MED ORDER — PREDNISONE 10 MG TABLET
ORAL_TABLET | 0 refills | 0 days | Status: CP
Start: 2022-06-21 — End: ?

## 2022-06-23 ENCOUNTER — Ambulatory Visit: Admit: 2022-06-23 | Discharge: 2022-06-24 | Payer: PRIVATE HEALTH INSURANCE

## 2022-06-28 ENCOUNTER — Telehealth: Payer: Self-pay | Admitting: Nurse Practitioner

## 2022-06-28 NOTE — Telephone Encounter (Signed)
Called patient to schedule Medicare Annual Wellness Visit (AWV). Left message for patient to call back and schedule Medicare Annual Wellness Visit (AWV).  Last date of AWV: 10/05/2020   Please schedule an appointment at any time with Abby, NHA. .  If any questions, please contact me at [phone].  Thank you,  Judeth Cornfield,  AMB Clinical Support [facility] AWV Program Direct Dial ??[phone]

## 2022-07-18 DIAGNOSIS — S1093XD Contusion of unspecified part of neck, subsequent encounter: Principal | ICD-10-CM

## 2022-07-25 ENCOUNTER — Ambulatory Visit: Admit: 2022-07-25 | Discharge: 2022-07-26 | Payer: PRIVATE HEALTH INSURANCE

## 2022-07-26 DIAGNOSIS — Z9089 Acquired absence of other organs: Principal | ICD-10-CM

## 2022-07-26 DIAGNOSIS — Z9889 Other specified postprocedural states: Principal | ICD-10-CM

## 2022-07-26 MED ORDER — CALCITRIOL 0.5 MCG CAPSULE
ORAL_CAPSULE | Freq: Two times a day (BID) | ORAL | 3 refills | 90 days | Status: CP
Start: 2022-07-26 — End: 2023-07-21

## 2022-08-02 MED ORDER — CARVEDILOL 12.5 MG TABLET
ORAL_TABLET | Freq: Two times a day (BID) | ORAL | 3 refills | 90 days | Status: CP
Start: 2022-08-02 — End: 2023-07-28

## 2022-08-07 DIAGNOSIS — E213 Hyperparathyroidism, unspecified: Principal | ICD-10-CM

## 2022-08-07 MED ORDER — DOXERCALCIFEROL 1 MCG CAPSULE
ORAL_CAPSULE | 1 refills | 0 days
Start: 2022-08-07 — End: ?

## 2022-08-09 MED ORDER — DOXERCALCIFEROL 1 MCG CAPSULE
ORAL_CAPSULE | 1 refills | 0 days
Start: 2022-08-09 — End: ?

## 2022-08-30 MED ORDER — AURYXIA 210 MG IRON TABLET
ORAL_TABLET | 0 refills | 0 days
Start: 2022-08-30 — End: ?

## 2022-08-31 MED ORDER — AURYXIA 210 MG IRON TABLET
ORAL_TABLET | 0 refills | 0 days
Start: 2022-08-31 — End: ?

## 2022-09-08 MED ORDER — ONDANSETRON 4 MG DISINTEGRATING TABLET
ORAL_TABLET | 3 refills | 84 days | Status: CP
Start: 2022-09-08 — End: 2023-09-03

## 2022-10-11 DIAGNOSIS — I770 Arteriovenous fistula, acquired: Principal | ICD-10-CM

## 2022-10-12 ENCOUNTER — Encounter: Payer: Medicare HMO | Attending: Pediatric Pulmonology

## 2022-10-12 ENCOUNTER — Other Ambulatory Visit: Payer: Self-pay

## 2022-10-12 DIAGNOSIS — I5022 Chronic systolic (congestive) heart failure: Secondary | ICD-10-CM

## 2022-10-12 NOTE — Progress Notes (Signed)
Virtual Visit completed. Patient informed on EP and RD appointment and 6 Minute walk test. Patient also informed of patient health questionnaires on My Chart. Patient Verbalizes understanding. Visit diagnosis can be found in [facility] 10/31/2022.

## 2022-10-31 ENCOUNTER — Encounter: Payer: Medicare HMO | Attending: Pediatric Pulmonology

## 2022-10-31 VITALS — Ht 73.5 in | Wt 185.1 lb

## 2022-10-31 DIAGNOSIS — Z5189 Encounter for other specified aftercare: Secondary | ICD-10-CM | POA: Insufficient documentation

## 2022-10-31 DIAGNOSIS — I5022 Chronic systolic (congestive) heart failure: Secondary | ICD-10-CM | POA: Insufficient documentation

## 2022-10-31 NOTE — Patient Instructions (Signed)
Patient Instructions  Patient Details  Name: Louis [name] MRN: [id number] Date of Birth: [birth year] Referring Provider:  Delton Prairie, MD  Below are your personal goals for exercise, nutrition, and risk factors. Our goal is to help you stay on track towards obtaining and maintaining these goals. We will be discussing your progress on these goals with you throughout the program.  Initial Exercise Prescription:  Initial Exercise Prescription - 10/31/22 1400       Date of Initial Exercise RX and Referring Provider   Date 10/31/22    Referring Provider Dr. Delton Prairie      Oxygen   Maintain Oxygen Saturation 88% or higher      Treadmill   MPH 2.6    Grade 0    Minutes 15    METs 2.99      Recumbant Bike   Level 3    RPM 50    Watts 50    Minutes 15    METs 4.34      NuStep   Level 3    SPM 80    Minutes 15    METs 4.34      T5 Nustep   Level 3    SPM 80    Minutes 15    METs 4.34      Prescription Details   Frequency (times per week) 2    Duration Progress to 30 minutes of continuous aerobic without signs/symptoms of physical distress      Intensity   THRR 40-80% of Max Heartrate 98-139    Ratings of Perceived Exertion 11-13    Perceived Dyspnea 0-4      Progression   Progression Continue to progress workloads to maintain intensity without signs/symptoms of physical distress.      Resistance Training   Training Prescription Yes    Weight 5 lb    Reps 10-15             Exercise Goals: Frequency: Be able to perform aerobic exercise two to three times per week in program working toward 2-5 days per week of home exercise.  Intensity: Work with a perceived exertion of 11 (fairly light) - 15 (hard) while following your exercise prescription.  We will make changes to your prescription with you as you progress through the program.   Duration: Be able to do 30 to 45 minutes of continuous aerobic exercise in addition to a 5 minute warm-up and a 5 minute  cool-down routine.   Nutrition Goals: Your personal nutrition goals will be established when you do your nutrition analysis with the dietician.  The following are general nutrition guidelines to follow: Cholesterol < 200mg /day Sodium < 1500mg /day Fiber: Men over 50 yrs - 30 grams per day  Personal Goals:  Personal Goals and Risk Factors at Admission - 10/31/22 1429       Core Components/Risk Factors/Patient Goals on Admission    Weight Management Yes;Weight Maintenance    Intervention Weight Management: Develop a combined nutrition and exercise program designed to reach desired caloric intake, while maintaining appropriate intake of nutrient and fiber, sodium and fats, and appropriate energy expenditure required for the weight goal.;Weight Management: Provide education and appropriate resources to help participant work on and attain dietary goals.;Weight Management/Obesity: Establish reasonable short term and long term weight goals.    Admit Weight 185 lb 1.6 oz (84 kg)    Goal Weight: Short Term 185 lb 1.6 oz (84 kg)    Goal Weight: Long Term 185 lb  1.6 oz (84 kg)    Expected Outcomes Short Term: Continue to assess and modify interventions until short term weight is achieved;Long Term: Adherence to nutrition and physical activity/exercise program aimed toward attainment of established weight goal;Weight Maintenance: Understanding of the daily nutrition guidelines, which includes 25-35% calories from fat, 7% or less cal from saturated fats, less than 200mg  cholesterol, less than 1.5gm of sodium, & 5 or more servings of fruits and vegetables daily;Understanding recommendations for meals to include 15-35% energy as protein, 25-35% energy from fat, 35-60% energy from carbohydrates, less than 200mg  of dietary cholesterol, 20-35 gm of total fiber daily;Understanding of distribution of calorie intake throughout the day with the consumption of 4-5 meals/snacks    Improve shortness of breath with ADL's  Yes    Intervention Provide education, individualized exercise plan and daily activity instruction to help decrease symptoms of SOB with activities of daily living.    Expected Outcomes Short Term: Improve cardiorespiratory fitness to achieve a reduction of symptoms when performing ADLs;Long Term: Be able to perform more ADLs without symptoms or delay the onset of symptoms    Heart Failure Yes    Intervention Provide a combined exercise and nutrition program that is supplemented with education, support and counseling about heart failure. Directed toward relieving symptoms such as shortness of breath, decreased exercise tolerance, and extremity edema.    Expected Outcomes Improve functional capacity of life;Short term: Attendance in program 2-3 days a week with increased exercise capacity. Reported lower sodium intake. Reported increased fruit and vegetable intake. Reports medication compliance.;Short term: Daily weights obtained and reported for increase. Utilizing diuretic protocols set by physician.;Long term: Adoption of self-care skills and reduction of barriers for early signs and symptoms recognition and intervention leading to self-care maintenance.    Hypertension Yes    Intervention Provide education on lifestyle modifcations including regular physical activity/exercise, weight management, moderate sodium restriction and increased consumption of fresh fruit, vegetables, and low fat dairy, alcohol moderation, and smoking cessation.;Monitor prescription use compliance.    Expected Outcomes Short Term: Continued assessment and intervention until BP is < 140/3mm HG in hypertensive participants. < 130/75mm HG in hypertensive participants with diabetes, heart failure or chronic kidney disease.;Long Term: Maintenance of blood pressure at goal levels.             Tobacco Use Initial Evaluation: Social History   Tobacco Use  Smoking Status Never  Smokeless Tobacco Never    Exercise Goals and  Review:  Exercise Goals     Row Name 10/31/22 1426             Exercise Goals   Increase Physical Activity Yes       Intervention Provide advice, education, support and counseling about physical activity/exercise needs.;Develop an individualized exercise prescription for aerobic and resistive training based on initial evaluation findings, risk stratification, comorbidities and participant's personal goals.       Expected Outcomes Short Term: Attend rehab on a regular basis to increase amount of physical activity.;Long Term: Add in home exercise to make exercise part of routine and to increase amount of physical activity.;Long Term: Exercising regularly at least 3-5 days a week.       Increase Strength and Stamina Yes       Intervention Provide advice, education, support and counseling about physical activity/exercise needs.;Develop an individualized exercise prescription for aerobic and resistive training based on initial evaluation findings, risk stratification, comorbidities and participant's personal goals.       Expected Outcomes Short  Term: Increase workloads from initial exercise prescription for resistance, speed, and METs.;Short Term: Perform resistance training exercises routinely during rehab and add in resistance training at home;Long Term: Improve cardiorespiratory fitness, muscular endurance and strength as measured by increased METs and functional capacity ( )       Able to understand and use rate of perceived exertion (RPE) scale Yes       Intervention Provide education and explanation on how to use RPE scale       Expected Outcomes Short Term: Able to use RPE daily in rehab to express subjective intensity level;Long Term:  Able to use RPE to guide intensity level when exercising independently       Able to understand and use Dyspnea scale Yes       Intervention Provide education and explanation on how to use Dyspnea scale       Expected Outcomes Short Term: Able to use Dyspnea  scale daily in rehab to express subjective sense of shortness of breath during exertion;Long Term: Able to use Dyspnea scale to guide intensity level when exercising independently       Knowledge and understanding of Target Heart Rate Range (THRR) Yes       Intervention Provide education and explanation of THRR including how the numbers were predicted and where they are located for reference       Expected Outcomes Short Term: Able to state/look up THRR;Short Term: Able to use daily as guideline for intensity in rehab;Long Term: Able to use THRR to govern intensity when exercising independently       Able to check pulse independently Yes       Intervention Provide education and demonstration on how to check pulse in carotid and radial arteries.;Review the importance of being able to check your own pulse for safety during independent exercise       Expected Outcomes Short Term: Able to explain why pulse checking is important during independent exercise;Long Term: Able to check pulse independently and accurately       Understanding of Exercise Prescription Yes       Intervention Provide education, explanation, and written materials on patient's individual exercise prescription       Expected Outcomes Short Term: Able to explain program exercise prescription;Long Term: Able to explain home exercise prescription to exercise independently

## 2022-10-31 NOTE — Progress Notes (Signed)
Pulmonary Individual Treatment Plan  Patient Details  Name: Louis [name] MRN: [id number] Date of Birth: [birth year] Referring Provider:   Flowsheet Row Pulmonary Rehab from 10/31/2022 in [facility] Cardiac and Pulmonary Rehab  Referring Provider Dr. Delton Prairie       Initial Encounter Date:  Flowsheet Row Pulmonary Rehab from 10/31/2022 in [facility] Cardiac and Pulmonary Rehab  Date 10/31/22       Visit Diagnosis: Heart failure, chronic systolic (HCC)  Patient's Home Medications on Admission:  Current Outpatient Medications:    AURYXIA 1 GM 210 MG(Fe) tablet, Take 1 tablet by mouth 4 (four) times daily -  before meals and at bedtime. (Patient not taking: Reported on 10/12/2022), Disp: , Rfl:    B Complex-C-Folic Acid (RENA-VITE RX) 1 MG TABS, Take 1 tablet by mouth daily. (Patient not taking: Reported on 04/25/2022), Disp: , Rfl:    BIKTARVY 50-200-25 MG TABS tablet, Take 1 tablet by mouth daily., Disp: , Rfl:    calcium carbonate (TUMS) 500 MG chewable tablet, Chew by mouth. (Patient not taking: Reported on 10/12/2022), Disp: , Rfl:    carvedilol (COREG) 12.5 MG tablet, Take by mouth., Disp: , Rfl:    clindamycin (CLEOCIN T) 1 % SWAB, Apply topically., Disp: , Rfl:    clobetasol ointment (TEMOVATE) 0.05 %, Apply topically 2 (two) times daily as needed. (Patient not taking: Reported on 10/12/2022), Disp: , Rfl:    DESCOVY 200-25 MG tablet, Take 1 tablet by mouth daily., Disp: , Rfl:    diclofenac Sodium (VOLTAREN) 1 % GEL, Apply 4 g topically 4 (four) times daily. (Patient not taking: Reported on 04/25/2022), Disp: 100 g, Rfl: 1   methocarbamol (ROBAXIN) 500 MG tablet, TAKE 1 TABLET (500 MG TOTAL) BY MOUTH 2 (TWO) TIMES DAILY AS NEEDED. FOR PAIN, Disp: 60 tablet, Rfl: 0   minoxidil (LONITEN) 2.5 MG tablet, Take 2.5 mg by mouth daily. (Patient not taking: Reported on 04/25/2022), Disp: , Rfl:    ondansetron (ZOFRAN-ODT) 4 MG disintegrating tablet, Take 4 mg by mouth every 8 (eight) hours as needed.  (Patient not taking: Reported on 10/12/2022), Disp: , Rfl:    predniSONE (DELTASONE) 10 MG tablet, Take 1 tablet (10 mg total) by mouth 2 (two) times daily as needed. Prn gout attack for 2 days, Disp: 10 tablet, Rfl: 0   Skin Protectants, Misc. (AQUAGARD HYDRATING) 41 % OINT, Apply topically 2 (two) times daily., Disp: , Rfl:    TIVICAY 50 MG tablet, Take 1 tablet by mouth daily. (Patient not taking: Reported on 04/25/2022), Disp: , Rfl:    valACYclovir (VALTREX) 500 MG tablet, Take 500 mg by mouth daily as needed (for flare ups). , Disp: , Rfl:    valACYclovir (VALTREX) 500 MG tablet, Take by mouth. (Patient not taking: Reported on 10/12/2022), Disp: , Rfl:   Past Medical History: Past Medical History:  Diagnosis Date   Chronic kidney disease    esrd   Dermatophytosis of foot 06/23/2004   Environmental allergies    ESRD (end stage renal disease) on dialysis (HCC)    Gout    HIV infection (HCC) 12/1995   Hypertension    Seborrhea 06/23/2004    Tobacco Use: Social History   Tobacco Use  Smoking Status Never  Smokeless Tobacco Never    Labs: Review Flowsheet       Latest Ref Rng & Units 06/09/2010 08/09/2011 08/23/2016  Labs for ITP Cardiac and Pulmonary Rehab  Cholestrol 0 - 200 mg/dL [phone]  [phone]  -  LDL (calc) 0 - 99 mg/dL 371  696  -  HDL-C >78 mg/dL 55  54  -  Trlycerides <150 mg/dL 69  89  -  Hemoglobin L3Y 4.8 - 5.6 % - - 5.1     Details             Pulmonary Assessment Scores:   UCSD: Self-administered rating of dyspnea associated with activities of daily living (ADLs) 6-point scale (0 = "not at all" to 5 = "maximal or unable to do because of breathlessness")  Scoring Scores range from 0 to 120.  Minimally important difference is 5 units  CAT: CAT can identify the health impairment of COPD patients and is better correlated with disease progression.  CAT has a scoring range of zero to 40. The CAT score is classified into four groups of low (less than 10), medium (10  - 20), high (21-30) and very high (31-40) based on the impact level of disease on health status. A CAT score over 10 suggests significant symptoms.  A worsening CAT score could be explained by an exacerbation, poor medication adherence, poor inhaler technique, or progression of COPD or comorbid conditions.  CAT MCID is 2 points  mMRC: mMRC (Modified Medical Research Council) Dyspnea Scale is used to assess the degree of baseline functional disability in patients of respiratory disease due to dyspnea. No minimal important difference is established. A decrease in score of 1 point or greater is considered a positive change.   Pulmonary Function Assessment:  Pulmonary Function Assessment - 10/12/22 0913       Breath   Shortness of Breath No             Exercise Target Goals: Exercise Program Goal: Individual exercise prescription set using results from initial 6 min walk test and THRR while considering  patient's activity barriers and safety.   Exercise Prescription Goal: Initial exercise prescription builds to 30-45 minutes a day of aerobic activity, 2-3 days per week.  Home exercise guidelines will be given to patient during program as part of exercise prescription that the participant will acknowledge.  Education: Aerobic Exercise: - Group verbal and visual presentation on the components of exercise prescription. Introduces F.I.T.T principle from ACSM for exercise prescriptions.  Reviews F.I.T.T. principles of aerobic exercise including progression. Written material given at graduation.   Education: Resistance Exercise: - Group verbal and visual presentation on the components of exercise prescription. Introduces F.I.T.T principle from ACSM for exercise prescriptions  Reviews F.I.T.T. principles of resistance exercise including progression. Written material given at graduation.    Education: Exercise & Equipment Safety: - Individual verbal instruction and demonstration of equipment  use and safety with use of the equipment. Flowsheet Row Pulmonary Rehab from 10/31/2022 in [facility] Cardiac and Pulmonary Rehab  Date 10/31/22  Educator MB  Instruction Review Code 1- Verbalizes Understanding       Education: Exercise Physiology & General Exercise Guidelines: - Group verbal and written instruction with models to review the exercise physiology of the cardiovascular system and associated critical values. Provides general exercise guidelines with specific guidelines to those with heart or lung disease.    Education: Flexibility, Balance, Mind/Body Relaxation: - Group verbal and visual presentation with interactive activity on the components of exercise prescription. Introduces F.I.T.T principle from ACSM for exercise prescriptions. Reviews F.I.T.T. principles of flexibility and balance exercise training including progression. Also discusses the mind body connection.  Reviews various relaxation techniques to help reduce and manage stress (i.e. Deep breathing, progressive muscle relaxation,  and visualization). Balance handout provided to take home. Written material given at graduation.   Activity Barriers & Risk Stratification:  Activity Barriers & Cardiac Risk Stratification - 10/31/22 1416       Activity Barriers & Cardiac Risk Stratification   Activity Barriers None             6 Minute Walk:  6 Minute Walk     Row Name 10/31/22 1413         6 Minute Walk   Phase Initial     Distance 1380 feet     Walk Time 6 minutes     # of Rest Breaks 0     MPH 2.6     METS 4.34     RPE 11     Perceived Dyspnea  0     VO2 Peak 15.19     Symptoms Yes (comment)     Comments Blood Pressure was high today, no symptoms from it, but waited for it to go down prior to starting     Resting HR 57 bpm     Resting BP 160/100     Resting Oxygen Saturation  94 %     Exercise Oxygen Saturation  during 6 min walk 77 %     Max Ex. HR 86 bpm     Max Ex. BP 218/110     2 Minute  Post BP 190/110       Interval HR   1 Minute HR 65     2 Minute HR 76     3 Minute HR 81     4 Minute HR 81     5 Minute HR 82     6 Minute HR 86     Interval Heart Rate? Yes       Interval Oxygen   Interval Oxygen? Yes     Baseline Oxygen Saturation % 94 %     1 Minute Oxygen Saturation % 90 %     1 Minute Liters of Oxygen 0 L     2 Minute Oxygen Saturation % 85 %     2 Minute Liters of Oxygen 0 L     3 Minute Oxygen Saturation % 83 %     3 Minute Liters of Oxygen 0 L     4 Minute Oxygen Saturation % 81 %     4 Minute Liters of Oxygen 0 L     5 Minute Oxygen Saturation % 77 %     5 Minute Liters of Oxygen 0 L     6 Minute Oxygen Saturation % 84 %     6 Minute Liters of Oxygen 0 L     2 Minute Post Oxygen Saturation % 94 %     2 Minute Post Liters of Oxygen 0 L             Oxygen Initial Assessment:  Oxygen Initial Assessment - 10/12/22 0913       Home Oxygen   Home Oxygen Device None    Sleep Oxygen Prescription None    Home Exercise Oxygen Prescription None    Home Resting Oxygen Prescription None      Initial 6 min Walk   Oxygen Used None      Program Oxygen Prescription   Program Oxygen Prescription None      Intervention   Short Term Goals To learn and understand importance of maintaining oxygen saturations>88%;To learn and demonstrate proper pursed lip breathing techniques  or other breathing techniques. ;To learn and understand importance of monitoring SPO2 with pulse oximeter and demonstrate accurate use of the pulse oximeter.;To learn and exhibit compliance with exercise, home and travel O2 prescription    Long  Term Goals Exhibits compliance with exercise, home  and travel O2 prescription;Verbalizes importance of monitoring SPO2 with pulse oximeter and return demonstration;Maintenance of O2 saturations>88%;Exhibits proper breathing techniques, such as pursed lip breathing or other method taught during program session;Compliance with respiratory medication              Oxygen Re-Evaluation:   Oxygen Discharge (Final Oxygen Re-Evaluation):   Initial Exercise Prescription:  Initial Exercise Prescription - 10/31/22 1400       Date of Initial Exercise RX and Referring Provider   Date 10/31/22    Referring Provider Dr. Delton Prairie      Oxygen   Maintain Oxygen Saturation 88% or higher      Treadmill   MPH 2.6    Grade 0    Minutes 15    METs 2.99      Recumbant Bike   Level 3    RPM 50    Watts 50    Minutes 15    METs 4.34      NuStep   Level 3    SPM 80    Minutes 15    METs 4.34      T5 Nustep   Level 3    SPM 80    Minutes 15    METs 4.34      Prescription Details   Frequency (times per week) 2    Duration Progress to 30 minutes of continuous aerobic without signs/symptoms of physical distress      Intensity   THRR 40-80% of Max Heartrate 98-139    Ratings of Perceived Exertion 11-13    Perceived Dyspnea 0-4      Progression   Progression Continue to progress workloads to maintain intensity without signs/symptoms of physical distress.      Resistance Training   Training Prescription Yes    Weight 5 lb    Reps 10-15             Perform Capillary Blood Glucose checks as needed.  Exercise Prescription Changes:   Exercise Prescription Changes     Row Name 10/31/22 1400             Response to Exercise   Blood Pressure (Admit) 160/100       Blood Pressure (Exercise) 218/110       Blood Pressure (Exit) 180/100       Heart Rate (Admit) 57 bpm       Heart Rate (Exercise) 86 bpm       Heart Rate (Exit) 59 bpm       Oxygen Saturation (Admit) 94 %       Oxygen Saturation (Exercise) 77 %       Oxygen Saturation (Exit) 97 %       Rating of Perceived Exertion (Exercise) 11       Perceived Dyspnea (Exercise) 0       Symptoms BP was high today, no symptoms from it, but waited for it to go down prior to       Comments results       Duration Progress to 30 minutes of  aerobic  without signs/symptoms of physical distress       Intensity THRR New  Progression   Progression Continue to progress workloads to maintain intensity without signs/symptoms of physical distress.       Average METs 4.34                Exercise Comments:   Exercise Goals and Review:   Exercise Goals     Row Name 10/31/22 1426             Exercise Goals   Increase Physical Activity Yes       Intervention Provide advice, education, support and counseling about physical activity/exercise needs.;Develop an individualized exercise prescription for aerobic and resistive training based on initial evaluation findings, risk stratification, comorbidities and participant's personal goals.       Expected Outcomes Short Term: Attend rehab on a regular basis to increase amount of physical activity.;Long Term: Add in home exercise to make exercise part of routine and to increase amount of physical activity.;Long Term: Exercising regularly at least 3-5 days a week.       Increase Strength and Stamina Yes       Intervention Provide advice, education, support and counseling about physical activity/exercise needs.;Develop an individualized exercise prescription for aerobic and resistive training based on initial evaluation findings, risk stratification, comorbidities and participant's personal goals.       Expected Outcomes Short Term: Increase workloads from initial exercise prescription for resistance, speed, and METs.;Short Term: Perform resistance training exercises routinely during rehab and add in resistance training at home;Long Term: Improve cardiorespiratory fitness, muscular endurance and strength as measured by increased METs and functional capacity ( )       Able to understand and use rate of perceived exertion (RPE) scale Yes       Intervention Provide education and explanation on how to use RPE scale       Expected Outcomes Short Term: Able to use RPE daily in rehab to express  subjective intensity level;Long Term:  Able to use RPE to guide intensity level when exercising independently       Able to understand and use Dyspnea scale Yes       Intervention Provide education and explanation on how to use Dyspnea scale       Expected Outcomes Short Term: Able to use Dyspnea scale daily in rehab to express subjective sense of shortness of breath during exertion;Long Term: Able to use Dyspnea scale to guide intensity level when exercising independently       Knowledge and understanding of Target Heart Rate Range (THRR) Yes       Intervention Provide education and explanation of THRR including how the numbers were predicted and where they are located for reference       Expected Outcomes Short Term: Able to state/look up THRR;Short Term: Able to use daily as guideline for intensity in rehab;Long Term: Able to use THRR to govern intensity when exercising independently       Able to check pulse independently Yes       Intervention Provide education and demonstration on how to check pulse in carotid and radial arteries.;Review the importance of being able to check your own pulse for safety during independent exercise       Expected Outcomes Short Term: Able to explain why pulse checking is important during independent exercise;Long Term: Able to check pulse independently and accurately       Understanding of Exercise Prescription Yes       Intervention Provide education, explanation, and written materials on patient's individual exercise prescription  Expected Outcomes Short Term: Able to explain program exercise prescription;Long Term: Able to explain home exercise prescription to exercise independently                Exercise Goals Re-Evaluation :   Discharge Exercise Prescription (Final Exercise Prescription Changes):  Exercise Prescription Changes - 10/31/22 1400       Response to Exercise   Blood Pressure (Admit) 160/100    Blood Pressure (Exercise) 218/110     Blood Pressure (Exit) 180/100    Heart Rate (Admit) 57 bpm    Heart Rate (Exercise) 86 bpm    Heart Rate (Exit) 59 bpm    Oxygen Saturation (Admit) 94 %    Oxygen Saturation (Exercise) 77 %    Oxygen Saturation (Exit) 97 %    Rating of Perceived Exertion (Exercise) 11    Perceived Dyspnea (Exercise) 0    Symptoms BP was high today, no symptoms from it, but waited for it to go down prior to    Comments results    Duration Progress to 30 minutes of  aerobic without signs/symptoms of physical distress    Intensity THRR New      Progression   Progression Continue to progress workloads to maintain intensity without signs/symptoms of physical distress.    Average METs 4.34             Nutrition:  Target Goals: Understanding of nutrition guidelines, daily intake of sodium 1500mg , cholesterol 200mg , calories 30% from fat and 7% or less from saturated fats, daily to have 5 or more servings of fruits and vegetables.  Education: All About Nutrition: -Group instruction provided by verbal, written material, interactive activities, discussions, models, and posters to present general guidelines for heart healthy nutrition including fat, fiber, MyPlate, the role of sodium in heart healthy nutrition, utilization of the nutrition label, and utilization of this knowledge for meal planning. Follow up email sent as well. Written material given at graduation.   Biometrics:  Pre Biometrics - 10/31/22 1427       Pre Biometrics   Height 6' 1.5" (1.867 m)    Weight 185 lb 1.6 oz (84 kg)    Waist Circumference 37 inches    Hip Circumference 42 inches    Waist to Hip Ratio 0.88 %    BMI (Calculated) 24.09    Single Leg Stand 30 seconds              Nutrition Therapy Plan and Nutrition Goals:  Nutrition Therapy & Goals - 10/31/22 1428       Nutrition Therapy   RD appointment deferred Yes      Personal Nutrition Goals   Nutrition Goal RD appointment deferred, already met with  one with dialysis      Intervention Plan   Intervention Prescribe, educate and counsel regarding individualized specific dietary modifications aiming towards targeted core components such as weight, hypertension, lipid management, diabetes, heart failure and other comorbidities.;Nutrition handout(s) given to patient.    Expected Outcomes Short Term Goal: Understand basic principles of dietary content, such as calories, fat, sodium, cholesterol and nutrients.;Long Term Goal: Adherence to prescribed nutrition plan.;Short Term Goal: A plan has been developed with personal nutrition goals set during dietitian appointment.             Nutrition Assessments:  MEDIFICTS Score Key: >=70 Need to make dietary changes  40-70 Heart Healthy Diet <= 40 Therapeutic Level Cholesterol Diet   Picture Your Plate Scores: <56 Unhealthy dietary pattern  with much room for improvement. 41-50 Dietary pattern unlikely to meet recommendations for good health and room for improvement. 51-60 More healthful dietary pattern, with some room for improvement.  >60 Healthy dietary pattern, although there may be some specific behaviors that could be improved.   Nutrition Goals Re-Evaluation:   Nutrition Goals Discharge (Final Nutrition Goals Re-Evaluation):   Psychosocial: Target Goals: Acknowledge presence or absence of significant depression and/or stress, maximize coping skills, provide positive support system. Participant is able to verbalize types and ability to use techniques and skills needed for reducing stress and depression.   Education: Stress, Anxiety, and Depression - Group verbal and visual presentation to define topics covered.  Reviews how body is impacted by stress, anxiety, and depression.  Also discusses healthy ways to reduce stress and to treat/manage anxiety and depression.  Written material given at graduation.   Education: Sleep Hygiene -Provides group verbal and written instruction about  how sleep can affect your health.  Define sleep hygiene, discuss sleep cycles and impact of sleep habits. Review good sleep hygiene tips.    Initial Review & Psychosocial Screening:  Initial Psych Review & Screening - 10/12/22 0914       Initial Review   Current issues with None Identified      Family Dynamics   Good Support System? Yes    Comments Patient reports no issues with their current mental states, sleep, stress, depression or anxiety. Will follow up with patient in a few weeks for any changes.      Barriers   Psychosocial barriers to participate in program There are no identifiable barriers or psychosocial needs.      Screening Interventions   Interventions Encouraged to exercise;To provide support and resources with identified psychosocial needs;Provide feedback about the scores to participant    Expected Outcomes Short Term goal: Utilizing psychosocial counselor, staff and physician to assist with identification of specific Stressors or current issues interfering with healing process. Setting desired goal for each stressor or current issue identified.;Long Term Goal: Stressors or current issues are controlled or eliminated.;Short Term goal: Identification and review with participant of any Quality of Life or Depression concerns found by scoring the questionnaire.;Long Term goal: The participant improves quality of Life and PHQ9 Scores as seen by post scores and/or verbalization of changes             Quality of Life Scores:  Scores of 19 and below usually indicate a poorer quality of life in these areas.  A difference of  2-3 points is a clinically meaningful difference.  A difference of 2-3 points in the total score of the Quality of Life Index has been associated with significant improvement in overall quality of life, self-image, physical symptoms, and general health in studies assessing change in quality of life.  PHQ-9: Review Flowsheet  More data exists       10/31/2022 04/25/2022 10/05/2020 09/16/2020 08/10/2020  Depression screen PHQ 2/9  Decreased Interest 2 0 0 0 0  Down, Depressed, Hopeless 0 0 0 0 0  PHQ - 2 Score 2 0 0 0 0  Altered sleeping 1 0 - - -  Tired, decreased energy 2 1 - - -  Change in appetite 2 0 - - -  Feeling bad or failure about yourself  0 0 - - -  Trouble concentrating 0 0 - - -  Moving slowly or fidgety/restless 0 1 - - -  Suicidal thoughts 0 0 - - -  PHQ-9 Score 7  2 - - -    Details           Interpretation of Total Score  Total Score Depression Severity:  1-4 = Minimal depression, 5-9 = Mild depression, 10-14 = Moderate depression, 15-19 = Moderately severe depression, 20-27 = Severe depression   Psychosocial Evaluation and Intervention:  Psychosocial Evaluation - 10/12/22 0915       Psychosocial Evaluation & Interventions   Interventions Encouraged to exercise with the program and follow exercise prescription;Relaxation education;Stress management education    Comments Patient reports no issues with their current mental states, sleep, stress, depression or anxiety. Will follow up with patient in a few weeks for any changes.    Expected Outcomes Short: Exercise regularly to support mental health and notify staff of any changes. Long: maintain mental health and well being through teaching of rehab or prescribed medications independently.    Continue Psychosocial Services  Follow up required by staff             Psychosocial Re-Evaluation:   Psychosocial Discharge (Final Psychosocial Re-Evaluation):   Education: Education Goals: Education classes will be provided on a weekly basis, covering required topics. Participant will state understanding/return demonstration of topics presented.  Learning Barriers/Preferences:  Learning Barriers/Preferences - 10/12/22 0914       Learning Barriers/Preferences   Learning Barriers None    Learning Preferences None             General Pulmonary Education  Topics:  Infection Prevention: - Provides verbal and written material to individual with discussion of infection control including proper hand washing and proper equipment cleaning during exercise session. Flowsheet Row Pulmonary Rehab from 10/31/2022 in [facility] Cardiac and Pulmonary Rehab  Date 10/31/22  Educator MB  Instruction Review Code 1- Verbalizes Understanding       Falls Prevention: - Provides verbal and written material to individual with discussion of falls prevention and safety. Flowsheet Row Pulmonary Rehab from 10/31/2022 in [facility] Cardiac and Pulmonary Rehab  Date 10/31/22  Educator MB  Instruction Review Code 1- Verbalizes Understanding       Chronic Lung Disease Review: - Group verbal instruction with posters, models, PowerPoint presentations and videos,  to review new updates, new respiratory medications, new advancements in procedures and treatments. Providing information on websites and "800" numbers for continued self-education. Includes information about supplement oxygen, available portable oxygen systems, continuous and intermittent flow rates, oxygen safety, concentrators, and Medicare reimbursement for oxygen. Explanation of Pulmonary Drugs, including class, frequency, complications, importance of spacers, rinsing mouth after steroid MDI's, and proper cleaning methods for nebulizers. Review of basic lung anatomy and physiology related to function, structure, and complications of lung disease. Review of risk factors. Discussion about methods for diagnosing sleep apnea and types of masks and machines for OSA. Includes a review of the use of types of environmental controls: home humidity, furnaces, filters, dust mite/pet prevention, HEPA vacuums. Discussion about weather changes, air quality and the benefits of nasal washing. Instruction on Warning signs, infection symptoms, calling MD promptly, preventive modes, and value of vaccinations. Review of effective airway clearance,  coughing and/or vibration techniques. Emphasizing that all should Create an Action Plan. Written material given at graduation.   AED/CPR: - Group verbal and written instruction with the use of models to demonstrate the basic use of the AED with the basic ABC's of resuscitation.    Anatomy and Cardiac Procedures: - Group verbal and visual presentation and models provide information about basic cardiac anatomy and function. Reviews the  testing methods done to diagnose heart disease and the outcomes of the test results. Describes the treatment choices: Medical Management, Angioplasty, or Coronary Bypass Surgery for treating various heart conditions including Myocardial Infarction, Angina, Valve Disease, and Cardiac Arrhythmias.  Written material given at graduation.   Medication Safety: - Group verbal and visual instruction to review commonly prescribed medications for heart and lung disease. Reviews the medication, class of the drug, and side effects. Includes the steps to properly store meds and maintain the prescription regimen.  Written material given at graduation.   Other: -Provides group and verbal instruction on various topics (see comments)   Knowledge Questionnaire Score:    Core Components/Risk Factors/Patient Goals at Admission:  Personal Goals and Risk Factors at Admission - 10/31/22 1429       Core Components/Risk Factors/Patient Goals on Admission    Weight Management Yes;Weight Maintenance    Intervention Weight Management: Develop a combined nutrition and exercise program designed to reach desired caloric intake, while maintaining appropriate intake of nutrient and fiber, sodium and fats, and appropriate energy expenditure required for the weight goal.;Weight Management: Provide education and appropriate resources to help participant work on and attain dietary goals.;Weight Management/Obesity: Establish reasonable short term and long term weight goals.    Admit Weight 185  lb 1.6 oz (84 kg)    Goal Weight: Short Term 185 lb 1.6 oz (84 kg)    Goal Weight: Long Term 185 lb 1.6 oz (84 kg)    Expected Outcomes Short Term: Continue to assess and modify interventions until short term weight is achieved;Long Term: Adherence to nutrition and physical activity/exercise program aimed toward attainment of established weight goal;Weight Maintenance: Understanding of the daily nutrition guidelines, which includes 25-35% calories from fat, 7% or less cal from saturated fats, less than 200mg  cholesterol, less than 1.5gm of sodium, & 5 or more servings of fruits and vegetables daily;Understanding recommendations for meals to include 15-35% energy as protein, 25-35% energy from fat, 35-60% energy from carbohydrates, less than 200mg  of dietary cholesterol, 20-35 gm of total fiber daily;Understanding of distribution of calorie intake throughout the day with the consumption of 4-5 meals/snacks    Improve shortness of breath with ADL's Yes    Intervention Provide education, individualized exercise plan and daily activity instruction to help decrease symptoms of SOB with activities of daily living.    Expected Outcomes Short Term: Improve cardiorespiratory fitness to achieve a reduction of symptoms when performing ADLs;Long Term: Be able to perform more ADLs without symptoms or delay the onset of symptoms    Heart Failure Yes    Intervention Provide a combined exercise and nutrition program that is supplemented with education, support and counseling about heart failure. Directed toward relieving symptoms such as shortness of breath, decreased exercise tolerance, and extremity edema.    Expected Outcomes Improve functional capacity of life;Short term: Attendance in program 2-3 days a week with increased exercise capacity. Reported lower sodium intake. Reported increased fruit and vegetable intake. Reports medication compliance.;Short term: Daily weights obtained and reported for increase. Utilizing  diuretic protocols set by physician.;Long term: Adoption of self-care skills and reduction of barriers for early signs and symptoms recognition and intervention leading to self-care maintenance.    Hypertension Yes    Intervention Provide education on lifestyle modifcations including regular physical activity/exercise, weight management, moderate sodium restriction and increased consumption of fresh fruit, vegetables, and low fat dairy, alcohol moderation, and smoking cessation.;Monitor prescription use compliance.    Expected Outcomes Short Term: Continued  assessment and intervention until BP is < 140/89mm HG in hypertensive participants. < 130/56mm HG in hypertensive participants with diabetes, heart failure or chronic kidney disease.;Long Term: Maintenance of blood pressure at goal levels.             Education:Diabetes - Individual verbal and written instruction to review signs/symptoms of diabetes, desired ranges of glucose level fasting, after meals and with exercise. Acknowledge that pre and post exercise glucose checks will be done for 3 sessions at entry of program.   Know Your Numbers and Heart Failure: - Group verbal and visual instruction to discuss disease risk factors for cardiac and pulmonary disease and treatment options.  Reviews associated critical values for Overweight/Obesity, Hypertension, Cholesterol, and Diabetes.  Discusses basics of heart failure: signs/symptoms and treatments.  Introduces Heart Failure Zone chart for action plan for heart failure.  Written material given at graduation.   Core Components/Risk Factors/Patient Goals Review:    Core Components/Risk Factors/Patient Goals at Discharge (Final Review):    ITP Comments:  ITP Comments     Row Name 10/12/22 0911 10/31/22 1413         ITP Comments Virtual Visit completed. Patient informed on EP and RD appointment and 6 Minute walk test. Patient also informed of patient health questionnaires on My Chart.  Patient Verbalizes understanding. Visit diagnosis can be found in [facility] 10/31/2022. Completed and gym orientation. Initial ITP created and sent for review to Dr. Vida Rigger, Medical Director.               Comments: Initial ITP

## 2022-11-02 ENCOUNTER — Encounter: Payer: Medicare HMO | Admitting: *Deleted

## 2022-11-02 DIAGNOSIS — Z5189 Encounter for other specified aftercare: Secondary | ICD-10-CM | POA: Diagnosis not present

## 2022-11-02 DIAGNOSIS — I5022 Chronic systolic (congestive) heart failure: Secondary | ICD-10-CM

## 2022-11-02 NOTE — Progress Notes (Signed)
Daily Session Note  Patient Details  Name: Louis [name] MRN: [id number] Date of Birth: 1960-07-19 Referring Provider:   Flowsheet Row Pulmonary Rehab from 10/31/2022 in [facility] Cardiac and Pulmonary Rehab  Referring Provider Dr. Delton Prairie       Encounter Date: 11/02/2022  Check In:  Session Check In - 11/02/22 1133       Check-In   Supervising physician immediately available to respond to emergencies See telemetry face sheet for immediately available ER MD    Location [facility]-Cardiac & Pulmonary Rehab    Staff Present Ronette Deter, BS, Exercise Physiologist;Meredith Jewel Baize, RN BSN;Joseph Shelbie Proctor, RN, [place]    Virtual Visit No    Medication changes reported     No    Fall or balance concerns reported    No    Warm-up and Cool-down Performed on first and last piece of equipment    Resistance Training Performed Yes    VAD Patient? No    PAD/SET Patient? No      Pain Assessment   Currently in Pain? No/denies                Social History   Tobacco Use  Smoking Status Never  Smokeless Tobacco Never    Goals Met:  Independence with exercise equipment Exercise tolerated well No report of concerns or symptoms today Strength training completed today  Goals Unmet:  Not Applicable  Comments: First full day of exercise!  Patient was oriented to gym and equipment including functions, settings, policies, and procedures.  Patient's individual exercise prescription and treatment plan were reviewed.  All starting workloads were established based on the results of the 6 minute walk test done at initial orientation visit.  The plan for exercise progression was also introduced and progression will be customized based on patient's performance and goals.    Dr. Bethann Punches is Medical Director for [facility] Cardiac Rehabilitation.  Dr. Vida Rigger is Medical Director for [facility] Pulmonary Rehabilitation.

## 2022-11-07 ENCOUNTER — Encounter: Payer: Medicare HMO | Admitting: *Deleted

## 2022-11-07 DIAGNOSIS — Z5189 Encounter for other specified aftercare: Secondary | ICD-10-CM | POA: Diagnosis not present

## 2022-11-07 DIAGNOSIS — I5022 Chronic systolic (congestive) heart failure: Secondary | ICD-10-CM

## 2022-11-07 NOTE — Progress Notes (Signed)
Daily Session Note  Patient Details  Name: Louis [name] MRN: [id number] Date of Birth: [birth year] Referring Provider:   Flowsheet Row Pulmonary Rehab from 10/31/2022 in [facility] Cardiac and Pulmonary Rehab  Referring Provider Dr. Delton Prairie       Encounter Date: 11/07/2022  Check In:  Session Check In - 11/07/22 1104       Check-In   Supervising physician immediately available to respond to emergencies See telemetry face sheet for immediately available ER MD    Location [facility]-Cardiac & Pulmonary Rehab    Staff Present Ronette Deter, BS, Exercise Physiologist;Meredith Jewel Baize, RN BSN;Maxon Conetta BS, , Exercise Physiologist;Keani Gotcher Katrinka Blazing, RN, ADN    Virtual Visit No    Medication changes reported     No    Fall or balance concerns reported    No    Warm-up and Cool-down Performed on first and last piece of equipment    Resistance Training Performed Yes    VAD Patient? No    PAD/SET Patient? No      Pain Assessment   Currently in Pain? No/denies                Social History   Tobacco Use  Smoking Status Never  Smokeless Tobacco Never    Goals Met:  Independence with exercise equipment Exercise tolerated well No report of concerns or symptoms today Strength training completed today  Goals Unmet:  Not Applicable  Comments: Pt able to follow exercise prescription today without complaint.  Will continue to monitor for progression.    Dr. Bethann Punches is Medical Director for [facility] Cardiac Rehabilitation.  Dr. Vida Rigger is Medical Director for [facility] Pulmonary Rehabilitation.

## 2022-11-09 ENCOUNTER — Encounter: Payer: Medicare HMO | Admitting: *Deleted

## 2022-11-09 DIAGNOSIS — Z5189 Encounter for other specified aftercare: Secondary | ICD-10-CM | POA: Diagnosis not present

## 2022-11-09 DIAGNOSIS — I5022 Chronic systolic (congestive) heart failure: Secondary | ICD-10-CM

## 2022-11-09 NOTE — Progress Notes (Signed)
Daily Session Note  Patient Details  Name: Louis [name] MRN: [id number] Date of Birth: February 26, 1960 Referring Provider:   Flowsheet Row Pulmonary Rehab from 10/31/2022 in [facility] Cardiac and Pulmonary Rehab  Referring Provider Dr. Delton Prairie       Encounter Date: 11/09/2022  Check In:  Session Check In - 11/09/22 1131       Check-In   Supervising physician immediately available to respond to emergencies See telemetry face sheet for immediately available ER MD    Location [facility]-Cardiac & Pulmonary Rehab    Staff Present Susann Givens, RN BSN;Laureen Manson Passey, BS, RRT, CPFT;Maxon Conetta BS, , Exercise Physiologist;Abednego Yeates Katrinka Blazing, RN, ADN    Virtual Visit No    Medication changes reported     No    Fall or balance concerns reported    No    Warm-up and Cool-down Performed on first and last piece of equipment    Resistance Training Performed Yes    VAD Patient? No    PAD/SET Patient? No      Pain Assessment   Currently in Pain? No/denies                Social History   Tobacco Use  Smoking Status Never  Smokeless Tobacco Never    Goals Met:  Independence with exercise equipment Exercise tolerated well No report of concerns or symptoms today Strength training completed today  Goals Unmet:  Not Applicable  Comments: Pt able to follow exercise prescription today without complaint.  Will continue to monitor for progression.    Dr. Bethann Punches is Medical Director for [facility] Cardiac Rehabilitation.  Dr. Vida Rigger is Medical Director for [facility] Pulmonary Rehabilitation.

## 2022-11-14 ENCOUNTER — Ambulatory Visit: Admit: 2022-11-14 | Discharge: 2022-11-15

## 2022-11-15 ENCOUNTER — Encounter: Payer: Self-pay | Admitting: *Deleted

## 2022-11-15 DIAGNOSIS — I5022 Chronic systolic (congestive) heart failure: Secondary | ICD-10-CM

## 2022-11-15 NOTE — Progress Notes (Signed)
Pulmonary Individual Treatment Plan  Patient Details  Name: Louis [name] MRN: [id number] Date of Birth: [birth year] Referring Provider:   Flowsheet Row Pulmonary Rehab from 10/31/2022 in [facility] Cardiac and Pulmonary Rehab  Referring Provider Dr. Delton Prairie       Initial Encounter Date:  Flowsheet Row Pulmonary Rehab from 10/31/2022 in [facility] Cardiac and Pulmonary Rehab  Date 10/31/22       Visit Diagnosis: Heart failure, chronic systolic (HCC)  Patient's Home Medications on Admission:  Current Outpatient Medications:    AURYXIA 1 GM 210 MG(Fe) tablet, Take 1 tablet by mouth 4 (four) times daily -  before meals and at bedtime. (Patient not taking: Reported on 10/12/2022), Disp: , Rfl:    B Complex-C-Folic Acid (RENA-VITE RX) 1 MG TABS, Take 1 tablet by mouth daily. (Patient not taking: Reported on 04/25/2022), Disp: , Rfl:    BIKTARVY 50-200-25 MG TABS tablet, Take 1 tablet by mouth daily., Disp: , Rfl:    calcium carbonate (TUMS) 500 MG chewable tablet, Chew by mouth. (Patient not taking: Reported on 10/12/2022), Disp: , Rfl:    carvedilol (COREG) 12.5 MG tablet, Take by mouth., Disp: , Rfl:    clindamycin (CLEOCIN T) 1 % SWAB, Apply topically., Disp: , Rfl:    clobetasol ointment (TEMOVATE) 0.05 %, Apply topically 2 (two) times daily as needed. (Patient not taking: Reported on 10/12/2022), Disp: , Rfl:    DESCOVY 200-25 MG tablet, Take 1 tablet by mouth daily., Disp: , Rfl:    diclofenac Sodium (VOLTAREN) 1 % GEL, Apply 4 g topically 4 (four) times daily. (Patient not taking: Reported on 04/25/2022), Disp: 100 g, Rfl: 1   methocarbamol (ROBAXIN) 500 MG tablet, TAKE 1 TABLET (500 MG TOTAL) BY MOUTH 2 (TWO) TIMES DAILY AS NEEDED. FOR PAIN, Disp: 60 tablet, Rfl: 0   minoxidil (LONITEN) 2.5 MG tablet, Take 2.5 mg by mouth daily. (Patient not taking: Reported on 04/25/2022), Disp: , Rfl:    ondansetron (ZOFRAN-ODT) 4 MG disintegrating tablet, Take 4 mg by mouth every 8 (eight) hours as needed.  (Patient not taking: Reported on 10/12/2022), Disp: , Rfl:    predniSONE (DELTASONE) 10 MG tablet, Take 1 tablet (10 mg total) by mouth 2 (two) times daily as needed. Prn gout attack for 2 days, Disp: 10 tablet, Rfl: 0   Skin Protectants, Misc. (AQUAGARD HYDRATING) 41 % OINT, Apply topically 2 (two) times daily., Disp: , Rfl:    TIVICAY 50 MG tablet, Take 1 tablet by mouth daily. (Patient not taking: Reported on 04/25/2022), Disp: , Rfl:    valACYclovir (VALTREX) 500 MG tablet, Take 500 mg by mouth daily as needed (for flare ups). , Disp: , Rfl:    valACYclovir (VALTREX) 500 MG tablet, Take by mouth. (Patient not taking: Reported on 10/12/2022), Disp: , Rfl:   Past Medical History: Past Medical History:  Diagnosis Date   Chronic kidney disease    esrd   Dermatophytosis of foot 06/23/2004   Environmental allergies    ESRD (end stage renal disease) on dialysis (HCC)    Gout    HIV infection (HCC) 12/1995   Hypertension    Seborrhea 06/23/2004    Tobacco Use: Social History   Tobacco Use  Smoking Status Never  Smokeless Tobacco Never    Labs: Review Flowsheet       Latest Ref Rng & Units 06/09/2010 08/09/2011 08/23/2016  Labs for ITP Cardiac and Pulmonary Rehab  Cholestrol 0 - 200 mg/dL 409  811  -  LDL (calc) 0 - 99 mg/dL 086  578  -  HDL-C >46 mg/dL 55  54  -  Trlycerides <150 mg/dL 69  89  -  Hemoglobin N6E 4.8 - 5.6 % - - 5.1     Details             Pulmonary Assessment Scores:  Pulmonary Assessment Scores     Row Name 10/31/22 1447         mMRC Score   mMRC Score 0              UCSD: Self-administered rating of dyspnea associated with activities of daily living (ADLs) 6-point scale (0 = "not at all" to 5 = "maximal or unable to do because of breathlessness")  Scoring Scores range from 0 to 120.  Minimally important difference is 5 units  CAT: CAT can identify the health impairment of COPD patients and is better correlated with disease progression.  CAT  has a scoring range of zero to 40. The CAT score is classified into four groups of low (less than 10), medium (10 - 20), high (21-30) and very high (31-40) based on the impact level of disease on health status. A CAT score over 10 suggests significant symptoms.  A worsening CAT score could be explained by an exacerbation, poor medication adherence, poor inhaler technique, or progression of COPD or comorbid conditions.  CAT MCID is 2 points  mMRC: mMRC (Modified Medical Research Council) Dyspnea Scale is used to assess the degree of baseline functional disability in patients of respiratory disease due to dyspnea. No minimal important difference is established. A decrease in score of 1 point or greater is considered a positive change.   Pulmonary Function Assessment:  Pulmonary Function Assessment - 10/12/22 0913       Breath   Shortness of Breath No             Exercise Target Goals: Exercise Program Goal: Individual exercise prescription set using results from initial 6 min walk test and THRR while considering  patient's activity barriers and safety.   Exercise Prescription Goal: Initial exercise prescription builds to 30-45 minutes a day of aerobic activity, 2-3 days per week.  Home exercise guidelines will be given to patient during program as part of exercise prescription that the participant will acknowledge.  Education: Aerobic Exercise: - Group verbal and visual presentation on the components of exercise prescription. Introduces F.I.T.T principle from ACSM for exercise prescriptions.  Reviews F.I.T.T. principles of aerobic exercise including progression. Written material given at graduation.   Education: Resistance Exercise: - Group verbal and visual presentation on the components of exercise prescription. Introduces F.I.T.T principle from ACSM for exercise prescriptions  Reviews F.I.T.T. principles of resistance exercise including progression. Written material given at  graduation.    Education: Exercise & Equipment Safety: - Individual verbal instruction and demonstration of equipment use and safety with use of the equipment. Flowsheet Row Pulmonary Rehab from 10/31/2022 in [facility] Cardiac and Pulmonary Rehab  Date 10/31/22  Educator MB  Instruction Review Code 1- Verbalizes Understanding       Education: Exercise Physiology & General Exercise Guidelines: - Group verbal and written instruction with models to review the exercise physiology of the cardiovascular system and associated critical values. Provides general exercise guidelines with specific guidelines to those with heart or lung disease.    Education: Flexibility, Balance, Mind/Body Relaxation: - Group verbal and visual presentation with interactive activity on the components of exercise prescription. Introduces F.I.T.T principle from  ACSM for exercise prescriptions. Reviews F.I.T.T. principles of flexibility and balance exercise training including progression. Also discusses the mind body connection.  Reviews various relaxation techniques to help reduce and manage stress (i.e. Deep breathing, progressive muscle relaxation, and visualization). Balance handout provided to take home. Written material given at graduation.   Activity Barriers & Risk Stratification:  Activity Barriers & Cardiac Risk Stratification - 10/31/22 1416       Activity Barriers & Cardiac Risk Stratification   Activity Barriers None             6 Minute Walk:  6 Minute Walk     Row Name 10/31/22 1413         6 Minute Walk   Phase Initial     Distance 1380 feet     Walk Time 6 minutes     # of Rest Breaks 0     MPH 2.6     METS 4.34     RPE 11     Perceived Dyspnea  0     VO2 Peak 15.19     Symptoms Yes (comment)     Comments Blood Pressure was high today, no symptoms from it, but waited for it to go down prior to starting     Resting HR 57 bpm     Resting BP 160/100     Resting Oxygen Saturation   94 %     Exercise Oxygen Saturation  during 6 min walk 77 %     Max Ex. HR 86 bpm     Max Ex. BP 218/110     2 Minute Post BP 190/110       Interval HR   1 Minute HR 65     2 Minute HR 76     3 Minute HR 81     4 Minute HR 81     5 Minute HR 82     6 Minute HR 86     Interval Heart Rate? Yes       Interval Oxygen   Interval Oxygen? Yes     Baseline Oxygen Saturation % 94 %     1 Minute Oxygen Saturation % 90 %     1 Minute Liters of Oxygen 0 L     2 Minute Oxygen Saturation % 85 %     2 Minute Liters of Oxygen 0 L     3 Minute Oxygen Saturation % 83 %     3 Minute Liters of Oxygen 0 L     4 Minute Oxygen Saturation % 81 %     4 Minute Liters of Oxygen 0 L     5 Minute Oxygen Saturation % 77 %     5 Minute Liters of Oxygen 0 L     6 Minute Oxygen Saturation % 84 %     6 Minute Liters of Oxygen 0 L     2 Minute Post Oxygen Saturation % 94 %     2 Minute Post Liters of Oxygen 0 L             Oxygen Initial Assessment:  Oxygen Initial Assessment - 10/12/22 0913       Home Oxygen   Home Oxygen Device None    Sleep Oxygen Prescription None    Home Exercise Oxygen Prescription None    Home Resting Oxygen Prescription None      Initial 6 min Walk   Oxygen Used None  Program Oxygen Prescription   Program Oxygen Prescription None      Intervention   Short Term Goals To learn and understand importance of maintaining oxygen saturations>88%;To learn and demonstrate proper pursed lip breathing techniques or other breathing techniques. ;To learn and understand importance of monitoring SPO2 with pulse oximeter and demonstrate accurate use of the pulse oximeter.;To learn and exhibit compliance with exercise, home and travel O2 prescription    Long  Term Goals Exhibits compliance with exercise, home  and travel O2 prescription;Verbalizes importance of monitoring SPO2 with pulse oximeter and return demonstration;Maintenance of O2 saturations>88%;Exhibits proper breathing  techniques, such as pursed lip breathing or other method taught during program session;Compliance with respiratory medication             Oxygen Re-Evaluation:  Oxygen Re-Evaluation     Row Name 11/02/22 1135             Goals/Expected Outcomes   Comments Reviewed PLB technique with pt.  Talked about how it works and it's importance in maintaining their exercise saturations.       Goals/Expected Outcomes Short: Become more profiecient at using PLB. Long: Become independent at using PLB.                Oxygen Discharge (Final Oxygen Re-Evaluation):  Oxygen Re-Evaluation - 11/02/22 1135       Goals/Expected Outcomes   Comments Reviewed PLB technique with pt.  Talked about how it works and it's importance in maintaining their exercise saturations.    Goals/Expected Outcomes Short: Become more profiecient at using PLB. Long: Become independent at using PLB.             Initial Exercise Prescription:  Initial Exercise Prescription - 10/31/22 1400       Date of Initial Exercise RX and Referring Provider   Date 10/31/22    Referring Provider Dr. Delton Prairie      Oxygen   Maintain Oxygen Saturation 88% or higher      Treadmill   MPH 2.6    Grade 0    Minutes 15    METs 2.99      Recumbant Bike   Level 3    RPM 50    Watts 50    Minutes 15    METs 4.34      NuStep   Level 3    SPM 80    Minutes 15    METs 4.34      T5 Nustep   Level 3    SPM 80    Minutes 15    METs 4.34      Prescription Details   Frequency (times per week) 2    Duration Progress to 30 minutes of continuous aerobic without signs/symptoms of physical distress      Intensity   THRR 40-80% of Max Heartrate 98-139    Ratings of Perceived Exertion 11-13    Perceived Dyspnea 0-4      Progression   Progression Continue to progress workloads to maintain intensity without signs/symptoms of physical distress.      Resistance Training   Training Prescription Yes    Weight 5 lb     Reps 10-15             Perform Capillary Blood Glucose checks as needed.  Exercise Prescription Changes:   Exercise Prescription Changes     Row Name 10/31/22 1400             Response to Exercise  Blood Pressure (Admit) 160/100       Blood Pressure (Exercise) 218/110       Blood Pressure (Exit) 180/100       Heart Rate (Admit) 57 bpm       Heart Rate (Exercise) 86 bpm       Heart Rate (Exit) 59 bpm       Oxygen Saturation (Admit) 94 %       Oxygen Saturation (Exercise) 77 %       Oxygen Saturation (Exit) 97 %       Rating of Perceived Exertion (Exercise) 11       Perceived Dyspnea (Exercise) 0       Symptoms BP was high today, no symptoms from it, but waited for it to go down prior to       Comments results       Duration Progress to 30 minutes of  aerobic without signs/symptoms of physical distress       Intensity THRR New         Progression   Progression Continue to progress workloads to maintain intensity without signs/symptoms of physical distress.       Average METs 4.34                Exercise Comments:   Exercise Comments     Row Name 11/02/22 1134           Exercise Comments First full day of exercise!  Patient was oriented to gym and equipment including functions, settings, policies, and procedures.  Patient's individual exercise prescription and treatment plan were reviewed.  All starting workloads were established based on the results of the 6 minute walk test done at initial orientation visit.  The plan for exercise progression was also introduced and progression will be customized based on patient's performance and goals.                Exercise Goals and Review:   Exercise Goals     Row Name 10/31/22 1426             Exercise Goals   Increase Physical Activity Yes       Intervention Provide advice, education, support and counseling about physical activity/exercise needs.;Develop an individualized exercise  prescription for aerobic and resistive training based on initial evaluation findings, risk stratification, comorbidities and participant's personal goals.       Expected Outcomes Short Term: Attend rehab on a regular basis to increase amount of physical activity.;Long Term: Add in home exercise to make exercise part of routine and to increase amount of physical activity.;Long Term: Exercising regularly at least 3-5 days a week.       Increase Strength and Stamina Yes       Intervention Provide advice, education, support and counseling about physical activity/exercise needs.;Develop an individualized exercise prescription for aerobic and resistive training based on initial evaluation findings, risk stratification, comorbidities and participant's personal goals.       Expected Outcomes Short Term: Increase workloads from initial exercise prescription for resistance, speed, and METs.;Short Term: Perform resistance training exercises routinely during rehab and add in resistance training at home;Long Term: Improve cardiorespiratory fitness, muscular endurance and strength as measured by increased METs and functional capacity ( )       Able to understand and use rate of perceived exertion (RPE) scale Yes       Intervention Provide education and explanation on how to use RPE scale  Expected Outcomes Short Term: Able to use RPE daily in rehab to express subjective intensity level;Long Term:  Able to use RPE to guide intensity level when exercising independently       Able to understand and use Dyspnea scale Yes       Intervention Provide education and explanation on how to use Dyspnea scale       Expected Outcomes Short Term: Able to use Dyspnea scale daily in rehab to express subjective sense of shortness of breath during exertion;Long Term: Able to use Dyspnea scale to guide intensity level when exercising independently       Knowledge and understanding of Target Heart Rate Range (THRR) Yes        Intervention Provide education and explanation of THRR including how the numbers were predicted and where they are located for reference       Expected Outcomes Short Term: Able to state/look up THRR;Short Term: Able to use daily as guideline for intensity in rehab;Long Term: Able to use THRR to govern intensity when exercising independently       Able to check pulse independently Yes       Intervention Provide education and demonstration on how to check pulse in carotid and radial arteries.;Review the importance of being able to check your own pulse for safety during independent exercise       Expected Outcomes Short Term: Able to explain why pulse checking is important during independent exercise;Long Term: Able to check pulse independently and accurately       Understanding of Exercise Prescription Yes       Intervention Provide education, explanation, and written materials on patient's individual exercise prescription       Expected Outcomes Short Term: Able to explain program exercise prescription;Long Term: Able to explain home exercise prescription to exercise independently                Exercise Goals Re-Evaluation :  Exercise Goals Re-Evaluation     Row Name 11/02/22 1134             Exercise Goal Re-Evaluation   Exercise Goals Review Able to understand and use rate of perceived exertion (RPE) scale;Able to understand and use Dyspnea scale;Knowledge and understanding of Target Heart Rate Range (THRR);Understanding of Exercise Prescription       Comments Reviewed RPE  and dyspnea scale, THR and program prescription with pt today.  Pt voiced understanding and was given a copy of goals to take home.       Expected Outcomes Short: Use RPE daily to regulate intensity. Long: Follow program prescription in THR.                Discharge Exercise Prescription (Final Exercise Prescription Changes):  Exercise Prescription Changes - 10/31/22 1400       Response to Exercise    Blood Pressure (Admit) 160/100    Blood Pressure (Exercise) 218/110    Blood Pressure (Exit) 180/100    Heart Rate (Admit) 57 bpm    Heart Rate (Exercise) 86 bpm    Heart Rate (Exit) 59 bpm    Oxygen Saturation (Admit) 94 %    Oxygen Saturation (Exercise) 77 %    Oxygen Saturation (Exit) 97 %    Rating of Perceived Exertion (Exercise) 11    Perceived Dyspnea (Exercise) 0    Symptoms BP was high today, no symptoms from it, but waited for it to go down prior to    Comments results  Duration Progress to 30 minutes of  aerobic without signs/symptoms of physical distress    Intensity THRR New      Progression   Progression Continue to progress workloads to maintain intensity without signs/symptoms of physical distress.    Average METs 4.34             Nutrition:  Target Goals: Understanding of nutrition guidelines, daily intake of sodium 1500mg , cholesterol 200mg , calories 30% from fat and 7% or less from saturated fats, daily to have 5 or more servings of fruits and vegetables.  Education: All About Nutrition: -Group instruction provided by verbal, written material, interactive activities, discussions, models, and posters to present general guidelines for heart healthy nutrition including fat, fiber, MyPlate, the role of sodium in heart healthy nutrition, utilization of the nutrition label, and utilization of this knowledge for meal planning. Follow up email sent as well. Written material given at graduation.   Biometrics:  Pre Biometrics - 10/31/22 1427       Pre Biometrics   Height 6' 1.5" (1.867 m)    Weight 185 lb 1.6 oz (84 kg)    Waist Circumference 37 inches    Hip Circumference 42 inches    Waist to Hip Ratio 0.88 %    BMI (Calculated) 24.09    Single Leg Stand 30 seconds              Nutrition Therapy Plan and Nutrition Goals:  Nutrition Therapy & Goals - 10/31/22 1428       Nutrition Therapy   RD appointment deferred Yes      Personal  Nutrition Goals   Nutrition Goal RD appointment deferred, already met with one with dialysis      Intervention Plan   Intervention Prescribe, educate and counsel regarding individualized specific dietary modifications aiming towards targeted core components such as weight, hypertension, lipid management, diabetes, heart failure and other comorbidities.;Nutrition handout(s) given to patient.    Expected Outcomes Short Term Goal: Understand basic principles of dietary content, such as calories, fat, sodium, cholesterol and nutrients.;Long Term Goal: Adherence to prescribed nutrition plan.;Short Term Goal: A plan has been developed with personal nutrition goals set during dietitian appointment.             Nutrition Assessments:  MEDIFICTS Score Key: >=70 Need to make dietary changes  40-70 Heart Healthy Diet <= 40 Therapeutic Level Cholesterol Diet   Picture Your Plate Scores: <40 Unhealthy dietary pattern with much room for improvement. 41-50 Dietary pattern unlikely to meet recommendations for good health and room for improvement. 51-60 More healthful dietary pattern, with some room for improvement.  >60 Healthy dietary pattern, although there may be some specific behaviors that could be improved.   Nutrition Goals Re-Evaluation:   Nutrition Goals Discharge (Final Nutrition Goals Re-Evaluation):   Psychosocial: Target Goals: Acknowledge presence or absence of significant depression and/or stress, maximize coping skills, provide positive support system. Participant is able to verbalize types and ability to use techniques and skills needed for reducing stress and depression.   Education: Stress, Anxiety, and Depression - Group verbal and visual presentation to define topics covered.  Reviews how body is impacted by stress, anxiety, and depression.  Also discusses healthy ways to reduce stress and to treat/manage anxiety and depression.  Written material given at  graduation.   Education: Sleep Hygiene -Provides group verbal and written instruction about how sleep can affect your health.  Define sleep hygiene, discuss sleep cycles and impact of sleep habits. Review good  sleep hygiene tips.    Initial Review & Psychosocial Screening:  Initial Psych Review & Screening - 10/12/22 0914       Initial Review   Current issues with None Identified      Family Dynamics   Good Support System? Yes    Comments Patient reports no issues with their current mental states, sleep, stress, depression or anxiety. Will follow up with patient in a few weeks for any changes.      Barriers   Psychosocial barriers to participate in program There are no identifiable barriers or psychosocial needs.      Screening Interventions   Interventions Encouraged to exercise;To provide support and resources with identified psychosocial needs;Provide feedback about the scores to participant    Expected Outcomes Short Term goal: Utilizing psychosocial counselor, staff and physician to assist with identification of specific Stressors or current issues interfering with healing process. Setting desired goal for each stressor or current issue identified.;Long Term Goal: Stressors or current issues are controlled or eliminated.;Short Term goal: Identification and review with participant of any Quality of Life or Depression concerns found by scoring the questionnaire.;Long Term goal: The participant improves quality of Life and PHQ9 Scores as seen by post scores and/or verbalization of changes             Quality of Life Scores:  Scores of 19 and below usually indicate a poorer quality of life in these areas.  A difference of  2-3 points is a clinically meaningful difference.  A difference of 2-3 points in the total score of the Quality of Life Index has been associated with significant improvement in overall quality of life, self-image, physical symptoms, and general health in studies  assessing change in quality of life.  PHQ-9: Review Flowsheet  More data exists      10/31/2022 04/25/2022 10/05/2020 09/16/2020 08/10/2020  Depression screen PHQ 2/9  Decreased Interest 2 0 0 0 0  Down, Depressed, Hopeless 0 0 0 0 0  PHQ - 2 Score 2 0 0 0 0  Altered sleeping 1 0 - - -  Tired, decreased energy 2 1 - - -  Change in appetite 2 0 - - -  Feeling bad or failure about yourself  0 0 - - -  Trouble concentrating 0 0 - - -  Moving slowly or fidgety/restless 0 1 - - -  Suicidal thoughts 0 0 - - -  PHQ-9 Score 7 2 - - -    Details           Interpretation of Total Score  Total Score Depression Severity:  1-4 = Minimal depression, 5-9 = Mild depression, 10-14 = Moderate depression, 15-19 = Moderately severe depression, 20-27 = Severe depression   Psychosocial Evaluation and Intervention:  Psychosocial Evaluation - 10/12/22 0915       Psychosocial Evaluation & Interventions   Interventions Encouraged to exercise with the program and follow exercise prescription;Relaxation education;Stress management education    Comments Patient reports no issues with their current mental states, sleep, stress, depression or anxiety. Will follow up with patient in a few weeks for any changes.    Expected Outcomes Short: Exercise regularly to support mental health and notify staff of any changes. Long: maintain mental health and well being through teaching of rehab or prescribed medications independently.    Continue Psychosocial Services  Follow up required by staff             Psychosocial Re-Evaluation:   Psychosocial Discharge (  Final Psychosocial Re-Evaluation):   Education: Education Goals: Education classes will be provided on a weekly basis, covering required topics. Participant will state understanding/return demonstration of topics presented.  Learning Barriers/Preferences:  Learning Barriers/Preferences - 10/12/22 0914       Learning Barriers/Preferences   Learning  Barriers None    Learning Preferences None             General Pulmonary Education Topics:  Infection Prevention: - Provides verbal and written material to individual with discussion of infection control including proper hand washing and proper equipment cleaning during exercise session. Flowsheet Row Pulmonary Rehab from 10/31/2022 in [facility] Cardiac and Pulmonary Rehab  Date 10/31/22  Educator MB  Instruction Review Code 1- Verbalizes Understanding       Falls Prevention: - Provides verbal and written material to individual with discussion of falls prevention and safety. Flowsheet Row Pulmonary Rehab from 10/31/2022 in [facility] Cardiac and Pulmonary Rehab  Date 10/31/22  Educator MB  Instruction Review Code 1- Verbalizes Understanding       Chronic Lung Disease Review: - Group verbal instruction with posters, models, PowerPoint presentations and videos,  to review new updates, new respiratory medications, new advancements in procedures and treatments. Providing information on websites and "800" numbers for continued self-education. Includes information about supplement oxygen, available portable oxygen systems, continuous and intermittent flow rates, oxygen safety, concentrators, and Medicare reimbursement for oxygen. Explanation of Pulmonary Drugs, including class, frequency, complications, importance of spacers, rinsing mouth after steroid MDI's, and proper cleaning methods for nebulizers. Review of basic lung anatomy and physiology related to function, structure, and complications of lung disease. Review of risk factors. Discussion about methods for diagnosing sleep apnea and types of masks and machines for OSA. Includes a review of the use of types of environmental controls: home humidity, furnaces, filters, dust mite/pet prevention, HEPA vacuums. Discussion about weather changes, air quality and the benefits of nasal washing. Instruction on Warning signs, infection symptoms, calling MD  promptly, preventive modes, and value of vaccinations. Review of effective airway clearance, coughing and/or vibration techniques. Emphasizing that all should Create an Action Plan. Written material given at graduation.   AED/CPR: - Group verbal and written instruction with the use of models to demonstrate the basic use of the AED with the basic ABC's of resuscitation.    Anatomy and Cardiac Procedures: - Group verbal and visual presentation and models provide information about basic cardiac anatomy and function. Reviews the testing methods done to diagnose heart disease and the outcomes of the test results. Describes the treatment choices: Medical Management, Angioplasty, or Coronary Bypass Surgery for treating various heart conditions including Myocardial Infarction, Angina, Valve Disease, and Cardiac Arrhythmias.  Written material given at graduation.   Medication Safety: - Group verbal and visual instruction to review commonly prescribed medications for heart and lung disease. Reviews the medication, class of the drug, and side effects. Includes the steps to properly store meds and maintain the prescription regimen.  Written material given at graduation.   Other: -Provides group and verbal instruction on various topics (see comments)   Knowledge Questionnaire Score:    Core Components/Risk Factors/Patient Goals at Admission:  Personal Goals and Risk Factors at Admission - 10/31/22 1429       Core Components/Risk Factors/Patient Goals on Admission    Weight Management Yes;Weight Maintenance    Intervention Weight Management: Develop a combined nutrition and exercise program designed to reach desired caloric intake, while maintaining appropriate intake of nutrient and fiber, sodium and fats,  and appropriate energy expenditure required for the weight goal.;Weight Management: Provide education and appropriate resources to help participant work on and attain dietary goals.;Weight  Management/Obesity: Establish reasonable short term and long term weight goals.    Admit Weight 185 lb 1.6 oz (84 kg)    Goal Weight: Short Term 185 lb 1.6 oz (84 kg)    Goal Weight: Long Term 185 lb 1.6 oz (84 kg)    Expected Outcomes Short Term: Continue to assess and modify interventions until short term weight is achieved;Long Term: Adherence to nutrition and physical activity/exercise program aimed toward attainment of established weight goal;Weight Maintenance: Understanding of the daily nutrition guidelines, which includes 25-35% calories from fat, 7% or less cal from saturated fats, less than 200mg  cholesterol, less than 1.5gm of sodium, & 5 or more servings of fruits and vegetables daily;Understanding recommendations for meals to include 15-35% energy as protein, 25-35% energy from fat, 35-60% energy from carbohydrates, less than 200mg  of dietary cholesterol, 20-35 gm of total fiber daily;Understanding of distribution of calorie intake throughout the day with the consumption of 4-5 meals/snacks    Improve shortness of breath with ADL's Yes    Intervention Provide education, individualized exercise plan and daily activity instruction to help decrease symptoms of SOB with activities of daily living.    Expected Outcomes Short Term: Improve cardiorespiratory fitness to achieve a reduction of symptoms when performing ADLs;Long Term: Be able to perform more ADLs without symptoms or delay the onset of symptoms    Heart Failure Yes    Intervention Provide a combined exercise and nutrition program that is supplemented with education, support and counseling about heart failure. Directed toward relieving symptoms such as shortness of breath, decreased exercise tolerance, and extremity edema.    Expected Outcomes Improve functional capacity of life;Short term: Attendance in program 2-3 days a week with increased exercise capacity. Reported lower sodium intake. Reported increased fruit and vegetable intake.  Reports medication compliance.;Short term: Daily weights obtained and reported for increase. Utilizing diuretic protocols set by physician.;Long term: Adoption of self-care skills and reduction of barriers for early signs and symptoms recognition and intervention leading to self-care maintenance.    Hypertension Yes    Intervention Provide education on lifestyle modifcations including regular physical activity/exercise, weight management, moderate sodium restriction and increased consumption of fresh fruit, vegetables, and low fat dairy, alcohol moderation, and smoking cessation.;Monitor prescription use compliance.    Expected Outcomes Short Term: Continued assessment and intervention until BP is < 140/31mm HG in hypertensive participants. < 130/69mm HG in hypertensive participants with diabetes, heart failure or chronic kidney disease.;Long Term: Maintenance of blood pressure at goal levels.             Education:Diabetes - Individual verbal and written instruction to review signs/symptoms of diabetes, desired ranges of glucose level fasting, after meals and with exercise. Acknowledge that pre and post exercise glucose checks will be done for 3 sessions at entry of program.   Know Your Numbers and Heart Failure: - Group verbal and visual instruction to discuss disease risk factors for cardiac and pulmonary disease and treatment options.  Reviews associated critical values for Overweight/Obesity, Hypertension, Cholesterol, and Diabetes.  Discusses basics of heart failure: signs/symptoms and treatments.  Introduces Heart Failure Zone chart for action plan for heart failure.  Written material given at graduation.   Core Components/Risk Factors/Patient Goals Review:    Core Components/Risk Factors/Patient Goals at Discharge (Final Review):    ITP Comments:  ITP Comments  Row Name 10/12/22 0911 10/31/22 1413 11/02/22 1134 11/15/22 1239     ITP Comments Virtual Visit completed. Patient  informed on EP and RD appointment and 6 Minute walk test. Patient also informed of patient health questionnaires on My Chart. Patient Verbalizes understanding. Visit diagnosis can be found in [facility] 10/31/2022. Completed and gym orientation. Initial ITP created and sent for review to Dr. Vida Rigger, Medical Director. First full day of exercise!  Patient was oriented to gym and equipment including functions, settings, policies, and procedures.  Patient's individual exercise prescription and treatment plan were reviewed.  All starting workloads were established based on the results of the 6 minute walk test done at initial orientation visit.  The plan for exercise progression was also introduced and progression will be customized based on patient's performance and goals. 30 Day review completed. Medical Director ITP review done, changes made as directed, and signed approval by Medical Director.    new to program             Comments:

## 2022-11-16 ENCOUNTER — Ambulatory Visit: Admit: 2022-11-16 | Discharge: 2022-11-17 | Attending: Surgery | Primary: Surgery

## 2022-11-16 ENCOUNTER — Encounter: Payer: Medicare HMO | Attending: Pediatric Pulmonology | Admitting: *Deleted

## 2022-11-16 DIAGNOSIS — I5022 Chronic systolic (congestive) heart failure: Secondary | ICD-10-CM | POA: Insufficient documentation

## 2022-11-16 NOTE — Progress Notes (Signed)
Daily Session Note  Patient Details  Name: Louis [name] MRN: [id number] Date of Birth: 19-Sep-1960 Referring Provider:   Flowsheet Row Pulmonary Rehab from 10/31/2022 in [facility] Cardiac and Pulmonary Rehab  Referring Provider Dr. Delton Prairie       Encounter Date: 11/16/2022  Check In:  Session Check In - 11/16/22 0955       Check-In   Supervising physician immediately available to respond to emergencies See telemetry face sheet for immediately available ER MD    Location [facility]-Cardiac & Pulmonary Rehab    Staff Present Maxon Conetta BS, , Exercise Physiologist;Kimmy Parish Katrinka Blazing, RN, Silas Flood, BS, Exercise Physiologist    Virtual Visit No    Medication changes reported     No    Fall or balance concerns reported    No    Warm-up and Cool-down Performed on first and last piece of equipment    Resistance Training Performed Yes    VAD Patient? No    PAD/SET Patient? No      Pain Assessment   Currently in Pain? No/denies                Social History   Tobacco Use  Smoking Status Never  Smokeless Tobacco Never    Goals Met:  Independence with exercise equipment Exercise tolerated well No report of concerns or symptoms today Strength training completed today  Goals Unmet:  Not Applicable  Comments: Pt able to follow exercise prescription today without complaint.  Will continue to monitor for progression.    Dr. Bethann Punches is Medical Director for [facility] Cardiac Rehabilitation.  Dr. Vida Rigger is Medical Director for [facility] Pulmonary Rehabilitation.

## 2022-11-21 ENCOUNTER — Encounter: Payer: Medicare HMO | Admitting: *Deleted

## 2022-11-21 DIAGNOSIS — I5022 Chronic systolic (congestive) heart failure: Secondary | ICD-10-CM

## 2022-11-21 NOTE — Progress Notes (Signed)
Daily Session Note  Patient Details  Name: Louis [name] MRN: [id number] Date of Birth: 11-21-1960 Referring Provider:   Flowsheet Row Pulmonary Rehab from 10/31/2022 in [facility] Cardiac and Pulmonary Rehab  Referring Provider Dr. Delton Prairie       Encounter Date: 11/21/2022  Check In:  Session Check In - 11/21/22 1128       Check-In   Supervising physician immediately available to respond to emergencies See telemetry face sheet for immediately available ER MD    Location [facility]-Cardiac & Pulmonary Rehab    Staff Present Cora Collum, RN, BSN, CCRP;Jason Wallace Cullens, RDN, LDN;Meredith Jewel Baize, RN BSN;Maxon Conetta BS, , Exercise Physiologist    Virtual Visit No    Medication changes reported     No    Fall or balance concerns reported    No    Warm-up and Cool-down Performed on first and last piece of equipment    Resistance Training Performed Yes    VAD Patient? No    PAD/SET Patient? No      Pain Assessment   Currently in Pain? No/denies                Social History   Tobacco Use  Smoking Status Never  Smokeless Tobacco Never    Goals Met:  Proper associated with RPD/PD & O2 Sat Independence with exercise equipment Exercise tolerated well No report of concerns or symptoms today  Goals Unmet:  Not Applicable  Comments: Pt able to follow exercise prescription today without complaint.  Will continue to monitor for progression.    Dr. Bethann Punches is Medical Director for [facility] Cardiac Rehabilitation.  Dr. Vida Rigger is Medical Director for [facility] Pulmonary Rehabilitation.

## 2022-11-23 ENCOUNTER — Encounter: Payer: Medicare HMO | Admitting: *Deleted

## 2022-11-23 DIAGNOSIS — I5022 Chronic systolic (congestive) heart failure: Secondary | ICD-10-CM | POA: Diagnosis not present

## 2022-11-23 NOTE — Progress Notes (Signed)
Daily Session Note  Patient Details  Name: Louis [name] MRN: [id number] Date of Birth: 01-05-1961 Referring Provider:   Flowsheet Row Pulmonary Rehab from 10/31/2022 in [facility] Cardiac and Pulmonary Rehab  Referring Provider Dr. Delton Prairie       Encounter Date: 11/23/2022  Check In:  Session Check In - 11/23/22 1105       Check-In   Supervising physician immediately available to respond to emergencies See telemetry face sheet for immediately available ER MD    Location [facility]-Cardiac & Pulmonary Rehab    Staff Present Ronette Deter, BS, Exercise Physiologist;Meredith Jewel Baize, RN BSN;Joseph Shelbie Proctor, RN, [place]    Virtual Visit No    Medication changes reported     No    Fall or balance concerns reported    No    Warm-up and Cool-down Performed on first and last piece of equipment    Resistance Training Performed Yes    VAD Patient? No    PAD/SET Patient? No      Pain Assessment   Currently in Pain? No/denies                Social History   Tobacco Use  Smoking Status Never  Smokeless Tobacco Never    Goals Met:  Independence with exercise equipment Exercise tolerated well No report of concerns or symptoms today Strength training completed today  Goals Unmet:  Not Applicable  Comments: Pt able to follow exercise prescription today without complaint.  Will continue to monitor for progression.    Dr. Bethann Punches is Medical Director for [facility] Cardiac Rehabilitation.  Dr. Vida Rigger is Medical Director for [facility] Pulmonary Rehabilitation.

## 2022-11-28 ENCOUNTER — Encounter: Payer: Medicare HMO | Admitting: *Deleted

## 2022-11-28 DIAGNOSIS — I5022 Chronic systolic (congestive) heart failure: Secondary | ICD-10-CM | POA: Diagnosis not present

## 2022-11-28 DIAGNOSIS — L739 Follicular disorder, unspecified: Principal | ICD-10-CM

## 2022-11-28 MED ORDER — DOXYCYCLINE HYCLATE 100 MG TABLET
ORAL_TABLET | 6 refills | 0.00000 days | Status: CP
Start: 2022-11-28 — End: 2022-11-28

## 2022-11-28 NOTE — Progress Notes (Signed)
Daily Session Note  Patient Details  Name: Louis [name] MRN: [id number] Date of Birth: 1960/06/05 Referring Provider:   Flowsheet Row Pulmonary Rehab from 10/31/2022 in [facility] Cardiac and Pulmonary Rehab  Referring Provider Dr. Delton Prairie       Encounter Date: 11/28/2022  Check In:  Session Check In - 11/28/22 1127       Check-In   Supervising physician immediately available to respond to emergencies See telemetry face sheet for immediately available ER MD    Location [facility]-Cardiac & Pulmonary Rehab    Staff Present Cora Collum, RN, BSN, CCRP;Jason Wallace Cullens, RDN, LDN;Meredith Jewel Baize, RN BSN;Maxon Conetta BS, , Exercise Physiologist    Virtual Visit No    Medication changes reported     No    Fall or balance concerns reported    No    Warm-up and Cool-down Performed on first and last piece of equipment    Resistance Training Performed Yes    VAD Patient? No    PAD/SET Patient? No      Pain Assessment   Currently in Pain? No/denies                Social History   Tobacco Use  Smoking Status Never  Smokeless Tobacco Never    Goals Met:  Proper associated with RPD/PD & O2 Sat Independence with exercise equipment Exercise tolerated well No report of concerns or symptoms today  Goals Unmet:  Not Applicable  Comments: Pt able to follow exercise prescription today without complaint.  Will continue to monitor for progression.    Dr. Bethann Punches is Medical Director for [facility] Cardiac Rehabilitation.  Dr. Vida Rigger is Medical Director for [facility] Pulmonary Rehabilitation.

## 2022-11-30 ENCOUNTER — Encounter: Payer: Medicare HMO | Admitting: *Deleted

## 2022-11-30 DIAGNOSIS — I5022 Chronic systolic (congestive) heart failure: Secondary | ICD-10-CM

## 2022-11-30 NOTE — Progress Notes (Signed)
Daily Session Note  Patient Details  Name: Louis [name] MRN: [id number] Date of Birth: 1961-02-13 Referring Provider:   Flowsheet Row Pulmonary Rehab from 10/31/2022 in [facility] Cardiac and Pulmonary Rehab  Referring Provider Dr. Delton Prairie       Encounter Date: 11/30/2022  Check In:  Session Check In - 11/30/22 1106       Check-In   Supervising physician immediately available to respond to emergencies See telemetry face sheet for immediately available ER MD    Location [facility]-Cardiac & Pulmonary Rehab    Staff Present Susann Givens, RN BSN;Joseph Hood, RCP,RRT,BSRT;Maxon [place] BS, , Exercise Physiologist;Jackelyn Illingworth Katrinka Blazing, RN, [place]    Virtual Visit No    Medication changes reported     No    Fall or balance concerns reported    No    Warm-up and Cool-down Performed on first and last piece of equipment    Resistance Training Performed Yes    VAD Patient? No    PAD/SET Patient? No      Pain Assessment   Currently in Pain? No/denies                Social History   Tobacco Use  Smoking Status Never  Smokeless Tobacco Never    Goals Met:  Independence with exercise equipment Exercise tolerated well No report of concerns or symptoms today Strength training completed today  Goals Unmet:  Not Applicable  Comments: Pt able to follow exercise prescription today without complaint.  Will continue to monitor for progression.    Dr. Bethann Punches is Medical Director for [facility] Cardiac Rehabilitation.  Dr. Vida Rigger is Medical Director for [facility] Pulmonary Rehabilitation.

## 2022-12-05 ENCOUNTER — Encounter: Payer: Medicare HMO | Admitting: *Deleted

## 2022-12-05 DIAGNOSIS — I5022 Chronic systolic (congestive) heart failure: Secondary | ICD-10-CM

## 2022-12-05 NOTE — Progress Notes (Signed)
Daily Session Note  Patient Details  Name: Louis [name] MRN: [id number] Date of Birth: 1961-01-28 Referring Provider:   Flowsheet Row Pulmonary Rehab from 10/31/2022 in [facility] Cardiac and Pulmonary Rehab  Referring Provider Dr. Delton Prairie       Encounter Date: 12/05/2022  Check In:  Session Check In - 12/05/22 1127       Check-In   Supervising physician immediately available to respond to emergencies See telemetry face sheet for immediately available ER MD    Location [facility]-Cardiac & Pulmonary Rehab    Staff Present Cora Collum, RN, BSN, CCRP;Jason Wallace Cullens, RDN, LDN;Maxon Conetta BS, , Exercise Physiologist;Meredith Jewel Baize, RN BSN    Virtual Visit No    Medication changes reported     No    Fall or balance concerns reported    No    Warm-up and Cool-down Performed on first and last piece of equipment    Resistance Training Performed Yes    VAD Patient? No    PAD/SET Patient? No      Pain Assessment   Currently in Pain? No/denies                Social History   Tobacco Use  Smoking Status Never  Smokeless Tobacco Never    Goals Met:  Proper associated with RPD/PD & O2 Sat Independence with exercise equipment Exercise tolerated well No report of concerns or symptoms today  Goals Unmet:  Not Applicable  Comments: Pt able to follow exercise prescription today without complaint.  Will continue to monitor for progression.    Dr. Bethann Punches is Medical Director for [facility] Cardiac Rehabilitation.  Dr. Vida Rigger is Medical Director for [facility] Pulmonary Rehabilitation.

## 2022-12-07 ENCOUNTER — Encounter: Payer: Medicare HMO | Admitting: *Deleted

## 2022-12-07 DIAGNOSIS — I5022 Chronic systolic (congestive) heart failure: Secondary | ICD-10-CM

## 2022-12-07 NOTE — Progress Notes (Signed)
Daily Session Note  Patient Details  Name: Louis [name] MRN: [id number] Date of Birth: Apr 19, 1960 Referring Provider:   Flowsheet Row Pulmonary Rehab from 10/31/2022 in [facility] Cardiac and Pulmonary Rehab  Referring Provider Dr. Delton Prairie       Encounter Date: 12/07/2022  Check In:  Session Check In - 12/07/22 1100       Check-In   Supervising physician immediately available to respond to emergencies See telemetry face sheet for immediately available ER MD    Location [facility]-Cardiac & Pulmonary Rehab    Staff Present Susann Givens, RN BSN;Joseph Reino Kent, RCP,RRT,BSRT;Chelsy Parrales Katrinka Blazing, RN, [place]    Virtual Visit No    Medication changes reported     No    Fall or balance concerns reported    No    Warm-up and Cool-down Performed on first and last piece of equipment    Resistance Training Performed Yes    VAD Patient? No    PAD/SET Patient? No      Pain Assessment   Currently in Pain? No/denies                Social History   Tobacco Use  Smoking Status Never  Smokeless Tobacco Never    Goals Met:  Independence with exercise equipment Exercise tolerated well No report of concerns or symptoms today Strength training completed today  Goals Unmet:  Not Applicable  Comments: Pt able to follow exercise prescription today without complaint.  Will continue to monitor for progression.    Dr. Bethann Punches is Medical Director for [facility] Cardiac Rehabilitation.  Dr. Vida Rigger is Medical Director for [facility] Pulmonary Rehabilitation.

## 2022-12-12 ENCOUNTER — Encounter: Payer: Medicare HMO | Admitting: *Deleted

## 2022-12-12 DIAGNOSIS — I5022 Chronic systolic (congestive) heart failure: Secondary | ICD-10-CM

## 2022-12-12 NOTE — Progress Notes (Signed)
Daily Session Note  Patient Details  Name: Louis [name] MRN: [id number] Date of Birth: 1960/10/22 Referring Provider:   Flowsheet Row Pulmonary Rehab from 10/31/2022 in [facility] Cardiac and Pulmonary Rehab  Referring Provider Dr. Delton Prairie       Encounter Date: 12/12/2022  Check In:  Session Check In - 12/12/22 1121       Check-In   Supervising physician immediately available to respond to emergencies See telemetry face sheet for immediately available ER MD    Location [facility]-Cardiac & Pulmonary Rehab    Staff Present Cora Collum, RN, BSN, CCRP;Jason Wallace Cullens, RDN, LDN;Meredith Jewel Baize, RN BSN;Maxon Conetta BS, , Exercise Physiologist    Virtual Visit No    Medication changes reported     No    Fall or balance concerns reported    No    Warm-up and Cool-down Performed on first and last piece of equipment    Resistance Training Performed Yes    VAD Patient? No    PAD/SET Patient? No      Pain Assessment   Currently in Pain? No/denies                Social History   Tobacco Use  Smoking Status Never  Smokeless Tobacco Never    Goals Met:  Proper associated with RPD/PD & O2 Sat Independence with exercise equipment Exercise tolerated well No report of concerns or symptoms today  Goals Unmet:  Not Applicable  Comments: Pt able to follow exercise prescription today without complaint.  Will continue to monitor for progression.    Dr. Bethann Punches is Medical Director for [facility] Cardiac Rehabilitation.  Dr. Vida Rigger is Medical Director for [facility] Pulmonary Rehabilitation.

## 2022-12-13 ENCOUNTER — Encounter: Payer: Self-pay | Admitting: *Deleted

## 2022-12-13 DIAGNOSIS — I5022 Chronic systolic (congestive) heart failure: Secondary | ICD-10-CM

## 2022-12-13 NOTE — Progress Notes (Signed)
Pulmonary Individual Treatment Plan  Patient Details  Name: Louis [name] MRN: [id number] Date of Birth: 1960-12-10 Referring Provider:   Flowsheet Row Pulmonary Rehab from 10/31/2022 in [facility] Cardiac and Pulmonary Rehab  Referring Provider Dr. Delton Prairie       Initial Encounter Date:  Flowsheet Row Pulmonary Rehab from 10/31/2022 in [facility] Cardiac and Pulmonary Rehab  Date 10/31/22       Visit Diagnosis: Heart failure, chronic systolic (HCC)  Patient's Home Medications on Admission:  Current Outpatient Medications:    AURYXIA 1 GM 210 MG(Fe) tablet, Take 1 tablet by mouth 4 (four) times daily -  before meals and at bedtime. (Patient not taking: Reported on 10/12/2022), Disp: , Rfl:    B Complex-C-Folic Acid (RENA-VITE RX) 1 MG TABS, Take 1 tablet by mouth daily. (Patient not taking: Reported on 04/25/2022), Disp: , Rfl:    BIKTARVY 50-200-25 MG TABS tablet, Take 1 tablet by mouth daily., Disp: , Rfl:    calcium carbonate (TUMS) 500 MG chewable tablet, Chew by mouth. (Patient not taking: Reported on 10/12/2022), Disp: , Rfl:    carvedilol (COREG) 12.5 MG tablet, Take by mouth., Disp: , Rfl:    clindamycin (CLEOCIN T) 1 % SWAB, Apply topically., Disp: , Rfl:    clobetasol ointment (TEMOVATE) 0.05 %, Apply topically 2 (two) times daily as needed. (Patient not taking: Reported on 10/12/2022), Disp: , Rfl:    DESCOVY 200-25 MG tablet, Take 1 tablet by mouth daily., Disp: , Rfl:    diclofenac Sodium (VOLTAREN) 1 % GEL, Apply 4 g topically 4 (four) times daily. (Patient not taking: Reported on 04/25/2022), Disp: 100 g, Rfl: 1   methocarbamol (ROBAXIN) 500 MG tablet, TAKE 1 TABLET (500 MG TOTAL) BY MOUTH 2 (TWO) TIMES DAILY AS NEEDED. FOR PAIN, Disp: 60 tablet, Rfl: 0   minoxidil (LONITEN) 2.5 MG tablet, Take 2.5 mg by mouth daily. (Patient not taking: Reported on 04/25/2022), Disp: , Rfl:    ondansetron (ZOFRAN-ODT) 4 MG disintegrating tablet, Take 4 mg by mouth every 8 (eight) hours as needed.  (Patient not taking: Reported on 10/12/2022), Disp: , Rfl:    predniSONE (DELTASONE) 10 MG tablet, Take 1 tablet (10 mg total) by mouth 2 (two) times daily as needed. Prn gout attack for 2 days, Disp: 10 tablet, Rfl: 0   Skin Protectants, Misc. (AQUAGARD HYDRATING) 41 % OINT, Apply topically 2 (two) times daily., Disp: , Rfl:    TIVICAY 50 MG tablet, Take 1 tablet by mouth daily. (Patient not taking: Reported on 04/25/2022), Disp: , Rfl:    valACYclovir (VALTREX) 500 MG tablet, Take 500 mg by mouth daily as needed (for flare ups). , Disp: , Rfl:    valACYclovir (VALTREX) 500 MG tablet, Take by mouth. (Patient not taking: Reported on 10/12/2022), Disp: , Rfl:   Past Medical History: Past Medical History:  Diagnosis Date   Chronic kidney disease    esrd   Dermatophytosis of foot 06/23/2004   Environmental allergies    ESRD (end stage renal disease) on dialysis (HCC)    Gout    HIV infection (HCC) 12/1995   Hypertension    Seborrhea 06/23/2004    Tobacco Use: Social History   Tobacco Use  Smoking Status Never  Smokeless Tobacco Never    Labs: Review Flowsheet       Latest Ref Rng & Units 06/09/2010 08/09/2011 08/23/2016  Labs for ITP Cardiac and Pulmonary Rehab  Cholestrol 0 - 200 mg/dL 644  034  -  LDL (calc) 0 - 99 mg/dL 161  096  -  HDL-C >04 mg/dL 55  54  -  Trlycerides <150 mg/dL 69  89  -  Hemoglobin V4U 4.8 - 5.6 % - - 5.1     Details             Pulmonary Assessment Scores:  Pulmonary Assessment Scores     Row Name 10/31/22 1447         mMRC Score   mMRC Score 0              UCSD: Self-administered rating of dyspnea associated with activities of daily living (ADLs) 6-point scale (0 = "not at all" to 5 = "maximal or unable to do because of breathlessness")  Scoring Scores range from 0 to 120.  Minimally important difference is 5 units  CAT: CAT can identify the health impairment of COPD patients and is better correlated with disease progression.  CAT  has a scoring range of zero to 40. The CAT score is classified into four groups of low (less than 10), medium (10 - 20), high (21-30) and very high (31-40) based on the impact level of disease on health status. A CAT score over 10 suggests significant symptoms.  A worsening CAT score could be explained by an exacerbation, poor medication adherence, poor inhaler technique, or progression of COPD or comorbid conditions.  CAT MCID is 2 points  mMRC: mMRC (Modified Medical Research Council) Dyspnea Scale is used to assess the degree of baseline functional disability in patients of respiratory disease due to dyspnea. No minimal important difference is established. A decrease in score of 1 point or greater is considered a positive change.   Pulmonary Function Assessment:  Pulmonary Function Assessment - 10/12/22 0913       Breath   Shortness of Breath No             Exercise Target Goals: Exercise Program Goal: Individual exercise prescription set using results from initial 6 min walk test and THRR while considering  patient's activity barriers and safety.   Exercise Prescription Goal: Initial exercise prescription builds to 30-45 minutes a day of aerobic activity, 2-3 days per week.  Home exercise guidelines will be given to patient during program as part of exercise prescription that the participant will acknowledge.  Education: Aerobic Exercise: - Group verbal and visual presentation on the components of exercise prescription. Introduces F.I.T.T principle from ACSM for exercise prescriptions.  Reviews F.I.T.T. principles of aerobic exercise including progression. Written material given at graduation.   Education: Resistance Exercise: - Group verbal and visual presentation on the components of exercise prescription. Introduces F.I.T.T principle from ACSM for exercise prescriptions  Reviews F.I.T.T. principles of resistance exercise including progression. Written material given at  graduation.    Education: Exercise & Equipment Safety: - Individual verbal instruction and demonstration of equipment use and safety with use of the equipment. Flowsheet Row Pulmonary Rehab from 10/31/2022 in [facility] Cardiac and Pulmonary Rehab  Date 10/31/22  Educator MB  Instruction Review Code 1- Verbalizes Understanding       Education: Exercise Physiology & General Exercise Guidelines: - Group verbal and written instruction with models to review the exercise physiology of the cardiovascular system and associated critical values. Provides general exercise guidelines with specific guidelines to those with heart or lung disease.    Education: Flexibility, Balance, Mind/Body Relaxation: - Group verbal and visual presentation with interactive activity on the components of exercise prescription. Introduces F.I.T.T principle from  ACSM for exercise prescriptions. Reviews F.I.T.T. principles of flexibility and balance exercise training including progression. Also discusses the mind body connection.  Reviews various relaxation techniques to help reduce and manage stress (i.e. Deep breathing, progressive muscle relaxation, and visualization). Balance handout provided to take home. Written material given at graduation.   Activity Barriers & Risk Stratification:  Activity Barriers & Cardiac Risk Stratification - 10/31/22 1416       Activity Barriers & Cardiac Risk Stratification   Activity Barriers None             6 Minute Walk:  6 Minute Walk     Row Name 10/31/22 1413         6 Minute Walk   Phase Initial     Distance 1380 feet     Walk Time 6 minutes     # of Rest Breaks 0     MPH 2.6     METS 4.34     RPE 11     Perceived Dyspnea  0     VO2 Peak 15.19     Symptoms Yes (comment)     Comments Blood Pressure was high today, no symptoms from it, but waited for it to go down prior to starting     Resting HR 57 bpm     Resting BP 160/100     Resting Oxygen Saturation   94 %     Exercise Oxygen Saturation  during 6 min walk 77 %     Max Ex. HR 86 bpm     Max Ex. BP 218/110     2 Minute Post BP 190/110       Interval HR   1 Minute HR 65     2 Minute HR 76     3 Minute HR 81     4 Minute HR 81     5 Minute HR 82     6 Minute HR 86     Interval Heart Rate? Yes       Interval Oxygen   Interval Oxygen? Yes     Baseline Oxygen Saturation % 94 %     1 Minute Oxygen Saturation % 90 %     1 Minute Liters of Oxygen 0 L     2 Minute Oxygen Saturation % 85 %     2 Minute Liters of Oxygen 0 L     3 Minute Oxygen Saturation % 83 %     3 Minute Liters of Oxygen 0 L     4 Minute Oxygen Saturation % 81 %     4 Minute Liters of Oxygen 0 L     5 Minute Oxygen Saturation % 77 %     5 Minute Liters of Oxygen 0 L     6 Minute Oxygen Saturation % 84 %     6 Minute Liters of Oxygen 0 L     2 Minute Post Oxygen Saturation % 94 %     2 Minute Post Liters of Oxygen 0 L             Oxygen Initial Assessment:  Oxygen Initial Assessment - 10/12/22 0913       Home Oxygen   Home Oxygen Device None    Sleep Oxygen Prescription None    Home Exercise Oxygen Prescription None    Home Resting Oxygen Prescription None      Initial 6 min Walk   Oxygen Used None  Program Oxygen Prescription   Program Oxygen Prescription None      Intervention   Short Term Goals To learn and understand importance of maintaining oxygen saturations>88%;To learn and demonstrate proper pursed lip breathing techniques or other breathing techniques. ;To learn and understand importance of monitoring SPO2 with pulse oximeter and demonstrate accurate use of the pulse oximeter.;To learn and exhibit compliance with exercise, home and travel O2 prescription    Long  Term Goals Exhibits compliance with exercise, home  and travel O2 prescription;Verbalizes importance of monitoring SPO2 with pulse oximeter and return demonstration;Maintenance of O2 saturations>88%;Exhibits proper breathing  techniques, such as pursed lip breathing or other method taught during program session;Compliance with respiratory medication             Oxygen Re-Evaluation:  Oxygen Re-Evaluation     Row Name 11/02/22 1135             Goals/Expected Outcomes   Comments Reviewed PLB technique with pt.  Talked about how it works and it's importance in maintaining their exercise saturations.       Goals/Expected Outcomes Short: Become more profiecient at using PLB. Long: Become independent at using PLB.                Oxygen Discharge (Final Oxygen Re-Evaluation):  Oxygen Re-Evaluation - 11/02/22 1135       Goals/Expected Outcomes   Comments Reviewed PLB technique with pt.  Talked about how it works and it's importance in maintaining their exercise saturations.    Goals/Expected Outcomes Short: Become more profiecient at using PLB. Long: Become independent at using PLB.             Initial Exercise Prescription:  Initial Exercise Prescription - 10/31/22 1400       Date of Initial Exercise RX and Referring Provider   Date 10/31/22    Referring Provider Dr. Delton Prairie      Oxygen   Maintain Oxygen Saturation 88% or higher      Treadmill   MPH 2.6    Grade 0    Minutes 15    METs 2.99      Recumbant Bike   Level 3    RPM 50    Watts 50    Minutes 15    METs 4.34      NuStep   Level 3    SPM 80    Minutes 15    METs 4.34      T5 Nustep   Level 3    SPM 80    Minutes 15    METs 4.34      Prescription Details   Frequency (times per week) 2    Duration Progress to 30 minutes of continuous aerobic without signs/symptoms of physical distress      Intensity   THRR 40-80% of Max Heartrate 98-139    Ratings of Perceived Exertion 11-13    Perceived Dyspnea 0-4      Progression   Progression Continue to progress workloads to maintain intensity without signs/symptoms of physical distress.      Resistance Training   Training Prescription Yes    Weight 5 lb     Reps 10-15             Perform Capillary Blood Glucose checks as needed.  Exercise Prescription Changes:   Exercise Prescription Changes     Row Name 10/31/22 1400 11/17/22 1000 12/01/22 1000         Response to Exercise  Blood Pressure (Admit) 160/100 180/90 156/88     Blood Pressure (Exercise) 218/110 190/90 158/86     Blood Pressure (Exit) 180/100 132/62 154/74     Heart Rate (Admit) 57 bpm 66 bpm 69 bpm     Heart Rate (Exercise) 86 bpm 99 bpm 86 bpm     Heart Rate (Exit) 59 bpm 72 bpm 76 bpm     Oxygen Saturation (Admit) 94 % 95 % 96 %     Oxygen Saturation (Exercise) 77 % 90 % 94 %     Oxygen Saturation (Exit) 97 % 93 % 96 %     Rating of Perceived Exertion (Exercise) 11 13 12      Perceived Dyspnea (Exercise) 0 0 2     Symptoms BP was high today, no symptoms from it, but waited for it to go down prior to -- none     Comments results First full week of exercise --     Duration Progress to 30 minutes of  aerobic without signs/symptoms of physical distress Continue with 30 min of aerobic exercise without signs/symptoms of physical distress. Continue with 30 min of aerobic exercise without signs/symptoms of physical distress.     Intensity THRR New THRR New THRR New       Progression   Progression Continue to progress workloads to maintain intensity without signs/symptoms of physical distress. Continue to progress workloads to maintain intensity without signs/symptoms of physical distress. Continue to progress workloads to maintain intensity without signs/symptoms of physical distress.     Average METs 4.34 4.34 2.71       Resistance Training   Training Prescription -- Yes Yes     Weight -- 5 lb 5 lb     Reps -- 10-15 10-15       Interval Training   Interval Training -- No No       Treadmill   MPH -- 2.6 --     Grade -- 0 --     Minutes -- 15 --     METs -- 2.99 --       Recumbant Bike   Level -- 3 3     Watts -- 22 22     Minutes -- 15 15      METs -- 2.82 2.81       NuStep   Level -- 3 --     Minutes -- 15 --     METs -- 2.4 --       REL-XR   Level -- -- 2     Minutes -- -- 30     METs -- -- 3.5       T5 Nustep   Level -- 3 3     Minutes -- 15 15     METs -- 2.2 2.3       Oxygen   Maintain Oxygen Saturation -- 88% or higher 88% or higher              Exercise Comments:   Exercise Comments     Row Name 11/02/22 1134           Exercise Comments First full day of exercise!  Patient was oriented to gym and equipment including functions, settings, policies, and procedures.  Patient's individual exercise prescription and treatment plan were reviewed.  All starting workloads were established based on the results of the 6 minute walk test done at initial orientation visit.  The plan for exercise progression was also introduced  and progression will be customized based on patient's performance and goals.                Exercise Goals and Review:   Exercise Goals     Row Name 10/31/22 1426             Exercise Goals   Increase Physical Activity Yes       Intervention Provide advice, education, support and counseling about physical activity/exercise needs.;Develop an individualized exercise prescription for aerobic and resistive training based on initial evaluation findings, risk stratification, comorbidities and participant's personal goals.       Expected Outcomes Short Term: Attend rehab on a regular basis to increase amount of physical activity.;Long Term: Add in home exercise to make exercise part of routine and to increase amount of physical activity.;Long Term: Exercising regularly at least 3-5 days a week.       Increase Strength and Stamina Yes       Intervention Provide advice, education, support and counseling about physical activity/exercise needs.;Develop an individualized exercise prescription for aerobic and resistive training based on initial evaluation findings, risk stratification,  comorbidities and participant's personal goals.       Expected Outcomes Short Term: Increase workloads from initial exercise prescription for resistance, speed, and METs.;Short Term: Perform resistance training exercises routinely during rehab and add in resistance training at home;Long Term: Improve cardiorespiratory fitness, muscular endurance and strength as measured by increased METs and functional capacity ( )       Able to understand and use rate of perceived exertion (RPE) scale Yes       Intervention Provide education and explanation on how to use RPE scale       Expected Outcomes Short Term: Able to use RPE daily in rehab to express subjective intensity level;Long Term:  Able to use RPE to guide intensity level when exercising independently       Able to understand and use Dyspnea scale Yes       Intervention Provide education and explanation on how to use Dyspnea scale       Expected Outcomes Short Term: Able to use Dyspnea scale daily in rehab to express subjective sense of shortness of breath during exertion;Long Term: Able to use Dyspnea scale to guide intensity level when exercising independently       Knowledge and understanding of Target Heart Rate Range (THRR) Yes       Intervention Provide education and explanation of THRR including how the numbers were predicted and where they are located for reference       Expected Outcomes Short Term: Able to state/look up THRR;Short Term: Able to use daily as guideline for intensity in rehab;Long Term: Able to use THRR to govern intensity when exercising independently       Able to check pulse independently Yes       Intervention Provide education and demonstration on how to check pulse in carotid and radial arteries.;Review the importance of being able to check your own pulse for safety during independent exercise       Expected Outcomes Short Term: Able to explain why pulse checking is important during independent exercise;Long Term: Able to  check pulse independently and accurately       Understanding of Exercise Prescription Yes       Intervention Provide education, explanation, and written materials on patient's individual exercise prescription       Expected Outcomes Short Term: Able to explain program exercise prescription;Long Term: Able to explain  home exercise prescription to exercise independently                Exercise Goals Re-Evaluation :  Exercise Goals Re-Evaluation     Row Name 11/02/22 1134 11/17/22 1036 12/01/22 1037         Exercise Goal Re-Evaluation   Exercise Goals Review Able to understand and use rate of perceived exertion (RPE) scale;Able to understand and use Dyspnea scale;Knowledge and understanding of Target Heart Rate Range (THRR);Understanding of Exercise Prescription Understanding of Exercise Prescription;Increase Physical Activity;Increase Strength and Stamina Understanding of Exercise Prescription;Increase Physical Activity;Increase Strength and Stamina     Comments Reviewed RPE  and dyspnea scale, THR and program prescription with pt today.  Pt voiced understanding and was given a copy of goals to take home. Luisfelipe is off to a good start in the program. He is doing well with the treadmill at a speed of 2.6 mph with no incline. He also has done well with the recumbent bike, T4 nustep, and T5 nustep at level 3. We will continue to monitor his progress in the program. Hameed is doing well in the program. He continues to work at level 3 on the recumbent bike and T5 nustep. He also began using the XR at level 2 and did wellwith it. He has not done any walking in rehab at this time. We will continue to monitor his progress in the program.     Expected Outcomes Short: Use RPE daily to regulate intensity. Long: Follow program prescription in THR. Short: Continue to follow current exercise prescription. Long: Continue exercise to improve strength and stamina. Short: Begin walking treadmill or track in  rehab. Long: Continue exercise to improve strength and stamina.              Discharge Exercise Prescription (Final Exercise Prescription Changes):  Exercise Prescription Changes - 12/01/22 1000       Response to Exercise   Blood Pressure (Admit) 156/88    Blood Pressure (Exercise) 158/86    Blood Pressure (Exit) 154/74    Heart Rate (Admit) 69 bpm    Heart Rate (Exercise) 86 bpm    Heart Rate (Exit) 76 bpm    Oxygen Saturation (Admit) 96 %    Oxygen Saturation (Exercise) 94 %    Oxygen Saturation (Exit) 96 %    Rating of Perceived Exertion (Exercise) 12    Perceived Dyspnea (Exercise) 2    Symptoms none    Duration Continue with 30 min of aerobic exercise without signs/symptoms of physical distress.    Intensity THRR New      Progression   Progression Continue to progress workloads to maintain intensity without signs/symptoms of physical distress.    Average METs 2.71      Resistance Training   Training Prescription Yes    Weight 5 lb    Reps 10-15      Interval Training   Interval Training No      Recumbant Bike   Level 3    Watts 22    Minutes 15    METs 2.81      REL-XR   Level 2    Minutes 30    METs 3.5      T5 Nustep   Level 3    Minutes 15    METs 2.3      Oxygen   Maintain Oxygen Saturation 88% or higher             Nutrition:  Target Goals:  Understanding of nutrition guidelines, daily intake of sodium 1500mg , cholesterol 200mg , calories 30% from fat and 7% or less from saturated fats, daily to have 5 or more servings of fruits and vegetables.  Education: All About Nutrition: -Group instruction provided by verbal, written material, interactive activities, discussions, models, and posters to present general guidelines for heart healthy nutrition including fat, fiber, MyPlate, the role of sodium in heart healthy nutrition, utilization of the nutrition label, and utilization of this knowledge for meal planning. Follow up email sent as well.  Written material given at graduation.   Biometrics:  Pre Biometrics - 10/31/22 1427       Pre Biometrics   Height 6' 1.5" (1.867 m)    Weight 185 lb 1.6 oz (84 kg)    Waist Circumference 37 inches    Hip Circumference 42 inches    Waist to Hip Ratio 0.88 %    BMI (Calculated) 24.09    Single Leg Stand 30 seconds              Nutrition Therapy Plan and Nutrition Goals:  Nutrition Therapy & Goals - 10/31/22 1428       Nutrition Therapy   RD appointment deferred Yes      Personal Nutrition Goals   Nutrition Goal RD appointment deferred, already met with one with dialysis      Intervention Plan   Intervention Prescribe, educate and counsel regarding individualized specific dietary modifications aiming towards targeted core components such as weight, hypertension, lipid management, diabetes, heart failure and other comorbidities.;Nutrition handout(s) given to patient.    Expected Outcomes Short Term Goal: Understand basic principles of dietary content, such as calories, fat, sodium, cholesterol and nutrients.;Long Term Goal: Adherence to prescribed nutrition plan.;Short Term Goal: A plan has been developed with personal nutrition goals set during dietitian appointment.             Nutrition Assessments:  MEDIFICTS Score Key: >=70 Need to make dietary changes  40-70 Heart Healthy Diet <= 40 Therapeutic Level Cholesterol Diet   Picture Your Plate Scores: <81 Unhealthy dietary pattern with much room for improvement. 41-50 Dietary pattern unlikely to meet recommendations for good health and room for improvement. 51-60 More healthful dietary pattern, with some room for improvement.  >60 Healthy dietary pattern, although there may be some specific behaviors that could be improved.   Nutrition Goals Re-Evaluation:   Nutrition Goals Discharge (Final Nutrition Goals Re-Evaluation):   Psychosocial: Target Goals: Acknowledge presence or absence of significant  depression and/or stress, maximize coping skills, provide positive support system. Participant is able to verbalize types and ability to use techniques and skills needed for reducing stress and depression.   Education: Stress, Anxiety, and Depression - Group verbal and visual presentation to define topics covered.  Reviews how body is impacted by stress, anxiety, and depression.  Also discusses healthy ways to reduce stress and to treat/manage anxiety and depression.  Written material given at graduation.   Education: Sleep Hygiene -Provides group verbal and written instruction about how sleep can affect your health.  Define sleep hygiene, discuss sleep cycles and impact of sleep habits. Review good sleep hygiene tips.    Initial Review & Psychosocial Screening:  Initial Psych Review & Screening - 10/12/22 0914       Initial Review   Current issues with None Identified      Family Dynamics   Good Support System? Yes    Comments Patient reports no issues with their current mental states, sleep,  stress, depression or anxiety. Will follow up with patient in a few weeks for any changes.      Barriers   Psychosocial barriers to participate in program There are no identifiable barriers or psychosocial needs.      Screening Interventions   Interventions Encouraged to exercise;To provide support and resources with identified psychosocial needs;Provide feedback about the scores to participant    Expected Outcomes Short Term goal: Utilizing psychosocial counselor, staff and physician to assist with identification of specific Stressors or current issues interfering with healing process. Setting desired goal for each stressor or current issue identified.;Long Term Goal: Stressors or current issues are controlled or eliminated.;Short Term goal: Identification and review with participant of any Quality of Life or Depression concerns found by scoring the questionnaire.;Long Term goal: The participant  improves quality of Life and PHQ9 Scores as seen by post scores and/or verbalization of changes             Quality of Life Scores:  Scores of 19 and below usually indicate a poorer quality of life in these areas.  A difference of  2-3 points is a clinically meaningful difference.  A difference of 2-3 points in the total score of the Quality of Life Index has been associated with significant improvement in overall quality of life, self-image, physical symptoms, and general health in studies assessing change in quality of life.  PHQ-9: Review Flowsheet  More data exists      10/31/2022 04/25/2022 10/05/2020 09/16/2020 08/10/2020  Depression screen PHQ 2/9  Decreased Interest 2 0 0 0 0  Down, Depressed, Hopeless 0 0 0 0 0  PHQ - 2 Score 2 0 0 0 0  Altered sleeping 1 0 - - -  Tired, decreased energy 2 1 - - -  Change in appetite 2 0 - - -  Feeling bad or failure about yourself  0 0 - - -  Trouble concentrating 0 0 - - -  Moving slowly or fidgety/restless 0 1 - - -  Suicidal thoughts 0 0 - - -  PHQ-9 Score 7 2 - - -    Details           Interpretation of Total Score  Total Score Depression Severity:  1-4 = Minimal depression, 5-9 = Mild depression, 10-14 = Moderate depression, 15-19 = Moderately severe depression, 20-27 = Severe depression   Psychosocial Evaluation and Intervention:  Psychosocial Evaluation - 10/12/22 0915       Psychosocial Evaluation & Interventions   Interventions Encouraged to exercise with the program and follow exercise prescription;Relaxation education;Stress management education    Comments Patient reports no issues with their current mental states, sleep, stress, depression or anxiety. Will follow up with patient in a few weeks for any changes.    Expected Outcomes Short: Exercise regularly to support mental health and notify staff of any changes. Long: maintain mental health and well being through teaching of rehab or prescribed medications  independently.    Continue Psychosocial Services  Follow up required by staff             Psychosocial Re-Evaluation:   Psychosocial Discharge (Final Psychosocial Re-Evaluation):   Education: Education Goals: Education classes will be provided on a weekly basis, covering required topics. Participant will state understanding/return demonstration of topics presented.  Learning Barriers/Preferences:  Learning Barriers/Preferences - 10/12/22 0914       Learning Barriers/Preferences   Learning Barriers None    Learning Preferences None  General Pulmonary Education Topics:  Infection Prevention: - Provides verbal and written material to individual with discussion of infection control including proper hand washing and proper equipment cleaning during exercise session. Flowsheet Row Pulmonary Rehab from 10/31/2022 in [facility] Rehab  Date 10/31/22  Educator MB  Instruction Review Code 1- Verbalizes Understanding       Falls Prevention: - Provides verbal and written material to individual with discussion of falls prevention and safety. Flowsheet Row Pulmonary Rehab from 10/31/2022 in [facility] Cardiac and Pulmonary Rehab  Date 10/31/22  Educator MB  Instruction Review Code 1- Verbalizes Understanding       Chronic Lung Disease Review: - Group verbal instruction with posters, models, PowerPoint presentations and videos,  to review new updates, new respiratory medications, new advancements in procedures and treatments. Providing information on websites and "800" numbers for continued self-education. Includes information about supplement oxygen, available portable oxygen systems, continuous and intermittent flow rates, oxygen safety, concentrators, and Medicare reimbursement for oxygen. Explanation of Pulmonary Drugs, including class, frequency, complications, importance of spacers, rinsing mouth after steroid MDI's, and proper cleaning methods for  nebulizers. Review of basic lung anatomy and physiology related to function, structure, and complications of lung disease. Review of risk factors. Discussion about methods for diagnosing sleep apnea and types of masks and machines for OSA. Includes a review of the use of types of environmental controls: home humidity, furnaces, filters, dust mite/pet prevention, HEPA vacuums. Discussion about weather changes, air quality and the benefits of nasal washing. Instruction on Warning signs, infection symptoms, calling MD promptly, preventive modes, and value of vaccinations. Review of effective airway clearance, coughing and/or vibration techniques. Emphasizing that all should Create an Action Plan. Written material given at graduation.   AED/CPR: - Group verbal and written instruction with the use of models to demonstrate the basic use of the AED with the basic ABC's of resuscitation.    Anatomy and Cardiac Procedures: - Group verbal and visual presentation and models provide information about basic cardiac anatomy and function. Reviews the testing methods done to diagnose heart disease and the outcomes of the test results. Describes the treatment choices: Medical Management, Angioplasty, or Coronary Bypass Surgery for treating various heart conditions including Myocardial Infarction, Angina, Valve Disease, and Cardiac Arrhythmias.  Written material given at graduation.   Medication Safety: - Group verbal and visual instruction to review commonly prescribed medications for heart and lung disease. Reviews the medication, class of the drug, and side effects. Includes the steps to properly store meds and maintain the prescription regimen.  Written material given at graduation.   Other: -Provides group and verbal instruction on various topics (see comments)   Knowledge Questionnaire Score:    Core Components/Risk Factors/Patient Goals at Admission:  Personal Goals and Risk Factors at Admission -  10/31/22 1429       Core Components/Risk Factors/Patient Goals on Admission    Weight Management Yes;Weight Maintenance    Intervention Weight Management: Develop a combined nutrition and exercise program designed to reach desired caloric intake, while maintaining appropriate intake of nutrient and fiber, sodium and fats, and appropriate energy expenditure required for the weight goal.;Weight Management: Provide education and appropriate resources to help participant work on and attain dietary goals.;Weight Management/Obesity: Establish reasonable short term and long term weight goals.    Admit Weight 185 lb 1.6 oz (84 kg)    Goal Weight: Short Term 185 lb 1.6 oz (84 kg)    Goal Weight: Long Term 185 lb 1.6  oz (84 kg)    Expected Outcomes Short Term: Continue to assess and modify interventions until short term weight is achieved;Long Term: Adherence to nutrition and physical activity/exercise program aimed toward attainment of established weight goal;Weight Maintenance: Understanding of the daily nutrition guidelines, which includes 25-35% calories from fat, 7% or less cal from saturated fats, less than 200mg  cholesterol, less than 1.5gm of sodium, & 5 or more servings of fruits and vegetables daily;Understanding recommendations for meals to include 15-35% energy as protein, 25-35% energy from fat, 35-60% energy from carbohydrates, less than 200mg  of dietary cholesterol, 20-35 gm of total fiber daily;Understanding of distribution of calorie intake throughout the day with the consumption of 4-5 meals/snacks    Improve shortness of breath with ADL's Yes    Intervention Provide education, individualized exercise plan and daily activity instruction to help decrease symptoms of SOB with activities of daily living.    Expected Outcomes Short Term: Improve cardiorespiratory fitness to achieve a reduction of symptoms when performing ADLs;Long Term: Be able to perform more ADLs without symptoms or delay the onset  of symptoms    Heart Failure Yes    Intervention Provide a combined exercise and nutrition program that is supplemented with education, support and counseling about heart failure. Directed toward relieving symptoms such as shortness of breath, decreased exercise tolerance, and extremity edema.    Expected Outcomes Improve functional capacity of life;Short term: Attendance in program 2-3 days a week with increased exercise capacity. Reported lower sodium intake. Reported increased fruit and vegetable intake. Reports medication compliance.;Short term: Daily weights obtained and reported for increase. Utilizing diuretic protocols set by physician.;Long term: Adoption of self-care skills and reduction of barriers for early signs and symptoms recognition and intervention leading to self-care maintenance.    Hypertension Yes    Intervention Provide education on lifestyle modifcations including regular physical activity/exercise, weight management, moderate sodium restriction and increased consumption of fresh fruit, vegetables, and low fat dairy, alcohol moderation, and smoking cessation.;Monitor prescription use compliance.    Expected Outcomes Short Term: Continued assessment and intervention until BP is < 140/43mm HG in hypertensive participants. < 130/11mm HG in hypertensive participants with diabetes, heart failure or chronic kidney disease.;Long Term: Maintenance of blood pressure at goal levels.             Education:Diabetes - Individual verbal and written instruction to review signs/symptoms of diabetes, desired ranges of glucose level fasting, after meals and with exercise. Acknowledge that pre and post exercise glucose checks will be done for 3 sessions at entry of program.   Know Your Numbers and Heart Failure: - Group verbal and visual instruction to discuss disease risk factors for cardiac and pulmonary disease and treatment options.  Reviews associated critical values for  Overweight/Obesity, Hypertension, Cholesterol, and Diabetes.  Discusses basics of heart failure: signs/symptoms and treatments.  Introduces Heart Failure Zone chart for action plan for heart failure.  Written material given at graduation.   Core Components/Risk Factors/Patient Goals Review:    Core Components/Risk Factors/Patient Goals at Discharge (Final Review):    ITP Comments:  ITP Comments     Row Name 10/12/22 0911 10/31/22 1413 11/02/22 1134 11/15/22 1239 12/13/22 0855   ITP Comments Virtual Visit completed. Patient informed on EP and RD appointment and 6 Minute walk test. Patient also informed of patient health questionnaires on My Chart. Patient Verbalizes understanding. Visit diagnosis can be found in [facility] 10/31/[facility]22. Completed and gym orientation. Initial ITP created and sent for review to Dr.  Vida Rigger, Medical Director. First full day of exercise!  Patient was oriented to gym and equipment including functions, settings, policies, and procedures.  Patient's individual exercise prescription and treatment plan were reviewed.  All starting workloads were established based on the results of the 6 minute walk test done at initial orientation visit.  The plan for exercise progression was also introduced and progression will be customized based on patient's performance and goals. 30 Day review completed. Medical Director ITP review done, changes made as directed, and signed approval by Medical Director.    new to program 30 Day review completed. Medical Director ITP review done, changes made as directed, and signed approval by Medical Director.            Comments:

## 2022-12-14 ENCOUNTER — Encounter: Payer: Medicare HMO | Admitting: *Deleted

## 2022-12-14 DIAGNOSIS — I5022 Chronic systolic (congestive) heart failure: Secondary | ICD-10-CM

## 2022-12-14 NOTE — Progress Notes (Signed)
Daily Session Note  Patient Details  Name: Louis [name] MRN: [id number] Date of Birth: [birth year] Referring Provider:   Flowsheet Row Pulmonary Rehab from 10/31/2022 in [facility] Cardiac and Pulmonary Rehab  Referring Provider Dr. Delton Prairie       Encounter Date: 12/14/2022  Check In:  Session Check In - 12/14/22 1101       Check-In   Supervising physician immediately available to respond to emergencies See telemetry face sheet for immediately available ER MD    Location [facility]-Cardiac & Pulmonary Rehab    Staff Present Ronette Deter, BS, Exercise Physiologist;Joseph [place], RCP,RRT,BSRT;Meredith Oneida, RN Atilano Median, RN, ADN    Virtual Visit No    Medication changes reported     No    Fall or balance concerns reported    No    Warm-up and Cool-down Performed on first and last piece of equipment    Resistance Training Performed Yes    VAD Patient? No    PAD/SET Patient? No      Pain Assessment   Currently in Pain? No/denies                Social History   Tobacco Use  Smoking Status Never  Smokeless Tobacco Never    Goals Met:  Independence with exercise equipment Exercise tolerated well No report of concerns or symptoms today Strength training completed today  Goals Unmet:  Not Applicable  Comments: Pt able to follow exercise prescription today without complaint.  Will continue to monitor for progression.    Dr. Bethann Punches is Medical Director for [facility] Cardiac Rehabilitation.  Dr. Vida Rigger is Medical Director for [facility] Pulmonary Rehabilitation.

## 2022-12-21 ENCOUNTER — Encounter: Payer: Medicare HMO | Attending: Pediatric Pulmonology | Admitting: *Deleted

## 2022-12-21 DIAGNOSIS — I5022 Chronic systolic (congestive) heart failure: Secondary | ICD-10-CM | POA: Insufficient documentation

## 2022-12-28 ENCOUNTER — Encounter: Payer: Medicare HMO | Admitting: *Deleted

## 2023-01-03 ENCOUNTER — Encounter: Payer: Self-pay | Admitting: *Deleted

## 2023-01-03 DIAGNOSIS — I5022 Chronic systolic (congestive) heart failure: Secondary | ICD-10-CM

## 2023-01-03 NOTE — Progress Notes (Signed)
Pulmonary Individual Treatment Plan  Patient Details  Name: Louis [name] MRN: [id number] Date of Birth: 01-Oct-1960 Referring Provider:   Flowsheet Row Pulmonary Rehab from 10/31/2022 in [facility] Cardiac and Pulmonary Rehab  Referring Provider Dr. Delton Prairie       Initial Encounter Date:  Flowsheet Row Pulmonary Rehab from 10/31/2022 in [facility] Cardiac and Pulmonary Rehab  Date 10/31/22       Visit Diagnosis: Heart failure, chronic systolic (HCC)  Patient's Home Medications on Admission:  Current Outpatient Medications:    AURYXIA 1 GM 210 MG(Fe) tablet, Take 1 tablet by mouth 4 (four) times daily -  before meals and at bedtime. (Patient not taking: Reported on 10/12/2022), Disp: , Rfl:    B Complex-C-Folic Acid (RENA-VITE RX) 1 MG TABS, Take 1 tablet by mouth daily. (Patient not taking: Reported on 04/25/2022), Disp: , Rfl:    BIKTARVY 50-200-25 MG TABS tablet, Take 1 tablet by mouth daily., Disp: , Rfl:    calcium carbonate (TUMS) 500 MG chewable tablet, Chew by mouth. (Patient not taking: Reported on 10/12/2022), Disp: , Rfl:    carvedilol (COREG) 12.5 MG tablet, Take by mouth., Disp: , Rfl:    clindamycin (CLEOCIN T) 1 % SWAB, Apply topically., Disp: , Rfl:    clobetasol ointment (TEMOVATE) 0.05 %, Apply topically 2 (two) times daily as needed. (Patient not taking: Reported on 10/12/2022), Disp: , Rfl:    DESCOVY 200-25 MG tablet, Take 1 tablet by mouth daily., Disp: , Rfl:    diclofenac Sodium (VOLTAREN) 1 % GEL, Apply 4 g topically 4 (four) times daily. (Patient not taking: Reported on 04/25/2022), Disp: 100 g, Rfl: 1   methocarbamol (ROBAXIN) 500 MG tablet, TAKE 1 TABLET (500 MG TOTAL) BY MOUTH 2 (TWO) TIMES DAILY AS NEEDED. FOR PAIN, Disp: 60 tablet, Rfl: 0   minoxidil (LONITEN) 2.5 MG tablet, Take 2.5 mg by mouth daily. (Patient not taking: Reported on 04/25/2022), Disp: , Rfl:    ondansetron (ZOFRAN-ODT) 4 MG disintegrating tablet, Take 4 mg by mouth every 8 (eight) hours as needed.  (Patient not taking: Reported on 10/12/2022), Disp: , Rfl:    predniSONE (DELTASONE) 10 MG tablet, Take 1 tablet (10 mg total) by mouth 2 (two) times daily as needed. Prn gout attack for 2 days, Disp: 10 tablet, Rfl: 0   Skin Protectants, Misc. (AQUAGARD HYDRATING) 41 % OINT, Apply topically 2 (two) times daily., Disp: , Rfl:    TIVICAY 50 MG tablet, Take 1 tablet by mouth daily. (Patient not taking: Reported on 04/25/2022), Disp: , Rfl:    valACYclovir (VALTREX) 500 MG tablet, Take 500 mg by mouth daily as needed (for flare ups). , Disp: , Rfl:    valACYclovir (VALTREX) 500 MG tablet, Take by mouth. (Patient not taking: Reported on 10/12/2022), Disp: , Rfl:   Past Medical History: Past Medical History:  Diagnosis Date   Chronic kidney disease    esrd   Dermatophytosis of foot 06/23/2004   Environmental allergies    ESRD (end stage renal disease) on dialysis (HCC)    Gout    HIV infection (HCC) 12/1995   Hypertension    Seborrhea 06/23/2004    Tobacco Use: Social History   Tobacco Use  Smoking Status Never  Smokeless Tobacco Never    Labs: Review Flowsheet       Latest Ref Rng & Units 06/09/2010 08/09/2011 08/23/2016  Labs for ITP Cardiac and Pulmonary Rehab  Cholestrol 0 - 200 mg/dL 578  469  -  LDL (calc) 0 - 99 mg/dL 518  841  -  HDL-C >66 mg/dL 55  54  -  Trlycerides <150 mg/dL 69  89  -  Hemoglobin A6T 4.8 - 5.6 % - - 5.1     Details             Pulmonary Assessment Scores:  Pulmonary Assessment Scores     Row Name 10/31/22 1447         mMRC Score   mMRC Score 0              UCSD: Self-administered rating of dyspnea associated with activities of daily living (ADLs) 6-point scale (0 = "not at all" to 5 = "maximal or unable to do because of breathlessness")  Scoring Scores range from 0 to 120.  Minimally important difference is 5 units  CAT: CAT can identify the health impairment of COPD patients and is better correlated with disease progression.  CAT  has a scoring range of zero to 40. The CAT score is classified into four groups of low (less than 10), medium (10 - 20), high (21-30) and very high (31-40) based on the impact level of disease on health status. A CAT score over 10 suggests significant symptoms.  A worsening CAT score could be explained by an exacerbation, poor medication adherence, poor inhaler technique, or progression of COPD or comorbid conditions.  CAT MCID is 2 points  mMRC: mMRC (Modified Medical Research Council) Dyspnea Scale is used to assess the degree of baseline functional disability in patients of respiratory disease due to dyspnea. No minimal important difference is established. A decrease in score of 1 point or greater is considered a positive change.   Pulmonary Function Assessment:  Pulmonary Function Assessment - 10/12/22 0913       Breath   Shortness of Breath No             Exercise Target Goals: Exercise Program Goal: Individual exercise prescription set using results from initial 6 min walk test and THRR while considering  patient's activity barriers and safety.   Exercise Prescription Goal: Initial exercise prescription builds to 30-45 minutes a day of aerobic activity, 2-3 days per week.  Home exercise guidelines will be given to patient during program as part of exercise prescription that the participant will acknowledge.  Education: Aerobic Exercise: - Group verbal and visual presentation on the components of exercise prescription. Introduces F.I.T.T principle from ACSM for exercise prescriptions.  Reviews F.I.T.T. principles of aerobic exercise including progression. Written material given at graduation.   Education: Resistance Exercise: - Group verbal and visual presentation on the components of exercise prescription. Introduces F.I.T.T principle from ACSM for exercise prescriptions  Reviews F.I.T.T. principles of resistance exercise including progression. Written material given at  graduation.    Education: Exercise & Equipment Safety: - Individual verbal instruction and demonstration of equipment use and safety with use of the equipment. Flowsheet Row Pulmonary Rehab from 10/31/2022 in [facility] Cardiac and Pulmonary Rehab  Date 10/31/22  Educator MB  Instruction Review Code 1- Verbalizes Understanding       Education: Exercise Physiology & General Exercise Guidelines: - Group verbal and written instruction with models to review the exercise physiology of the cardiovascular system and associated critical values. Provides general exercise guidelines with specific guidelines to those with heart or lung disease.    Education: Flexibility, Balance, Mind/Body Relaxation: - Group verbal and visual presentation with interactive activity on the components of exercise prescription. Introduces F.I.T.T principle from  ACSM for exercise prescriptions. Reviews F.I.T.T. principles of flexibility and balance exercise training including progression. Also discusses the mind body connection.  Reviews various relaxation techniques to help reduce and manage stress (i.e. Deep breathing, progressive muscle relaxation, and visualization). Balance handout provided to take home. Written material given at graduation.   Activity Barriers & Risk Stratification:  Activity Barriers & Cardiac Risk Stratification - 10/31/22 1416       Activity Barriers & Cardiac Risk Stratification   Activity Barriers None             6 Minute Walk:  6 Minute Walk     Row Name 10/31/22 1413         6 Minute Walk   Phase Initial     Distance 1380 feet     Walk Time 6 minutes     # of Rest Breaks 0     MPH 2.6     METS 4.34     RPE 11     Perceived Dyspnea  0     VO2 Peak 15.19     Symptoms Yes (comment)     Comments Blood Pressure was high today, no symptoms from it, but waited for it to go down prior to starting     Resting HR 57 bpm     Resting BP 160/100     Resting Oxygen Saturation   94 %     Exercise Oxygen Saturation  during 6 min walk 77 %     Max Ex. HR 86 bpm     Max Ex. BP 218/110     2 Minute Post BP 190/110       Interval HR   1 Minute HR 65     2 Minute HR 76     3 Minute HR 81     4 Minute HR 81     5 Minute HR 82     6 Minute HR 86     Interval Heart Rate? Yes       Interval Oxygen   Interval Oxygen? Yes     Baseline Oxygen Saturation % 94 %     1 Minute Oxygen Saturation % 90 %     1 Minute Liters of Oxygen 0 L     2 Minute Oxygen Saturation % 85 %     2 Minute Liters of Oxygen 0 L     3 Minute Oxygen Saturation % 83 %     3 Minute Liters of Oxygen 0 L     4 Minute Oxygen Saturation % 81 %     4 Minute Liters of Oxygen 0 L     5 Minute Oxygen Saturation % 77 %     5 Minute Liters of Oxygen 0 L     6 Minute Oxygen Saturation % 84 %     6 Minute Liters of Oxygen 0 L     2 Minute Post Oxygen Saturation % 94 %     2 Minute Post Liters of Oxygen 0 L             Oxygen Initial Assessment:  Oxygen Initial Assessment - 10/12/22 0913       Home Oxygen   Home Oxygen Device None    Sleep Oxygen Prescription None    Home Exercise Oxygen Prescription None    Home Resting Oxygen Prescription None      Initial 6 min Walk   Oxygen Used None  Program Oxygen Prescription   Program Oxygen Prescription None      Intervention   Short Term Goals To learn and understand importance of maintaining oxygen saturations>88%;To learn and demonstrate proper pursed lip breathing techniques or other breathing techniques. ;To learn and understand importance of monitoring SPO2 with pulse oximeter and demonstrate accurate use of the pulse oximeter.;To learn and exhibit compliance with exercise, home and travel O2 prescription    Long  Term Goals Exhibits compliance with exercise, home  and travel O2 prescription;Verbalizes importance of monitoring SPO2 with pulse oximeter and return demonstration;Maintenance of O2 saturations>88%;Exhibits proper breathing  techniques, such as pursed lip breathing or other method taught during program session;Compliance with respiratory medication             Oxygen Re-Evaluation:  Oxygen Re-Evaluation     Row Name 11/02/22 1135 12/14/22 1118           Program Oxygen Prescription   Program Oxygen Prescription -- None        Home Oxygen   Home Oxygen Device -- None      Sleep Oxygen Prescription -- None      Home Exercise Oxygen Prescription -- None      Home Resting Oxygen Prescription -- None        Goals/Expected Outcomes   Short Term Goals -- To learn and demonstrate proper pursed lip breathing techniques or other breathing techniques.       Long  Term Goals -- Exhibits proper breathing techniques, such as pursed lip breathing or other method taught during program session      Comments Reviewed PLB technique with pt.  Talked about how it works and it's importance in maintaining their exercise saturations. Informed patient how to perform the Pursed Lipped breathing technique. Told patient to Inhale through the nose and out the mouth with pursed lips to keep their airways open, help oxygenate them better, practice when at rest or doing strenuous activity. Patient Verbalizes understanding of technique and will work on and be reiterated during [facility].      Goals/Expected Outcomes Short: Become more profiecient at using PLB. Long: Become independent at using PLB. Short: use PLB with exertion. Long: use PLB on exertion proficiently and independently.               Oxygen Discharge (Final Oxygen Re-Evaluation):  Oxygen Re-Evaluation - 12/14/22 1118       Program Oxygen Prescription   Program Oxygen Prescription None      Home Oxygen   Home Oxygen Device None    Sleep Oxygen Prescription None    Home Exercise Oxygen Prescription None    Home Resting Oxygen Prescription None      Goals/Expected Outcomes   Short Term Goals To learn and demonstrate proper pursed lip breathing techniques or  other breathing techniques.     Long  Term Goals Exhibits proper breathing techniques, such as pursed lip breathing or other method taught during program session    Comments Informed patient how to perform the Pursed Lipped breathing technique. Told patient to Inhale through the nose and out the mouth with pursed lips to keep their airways open, help oxygenate them better, practice when at rest or doing strenuous activity. Patient Verbalizes understanding of technique and will work on and be reiterated during [facility].    Goals/Expected Outcomes Short: use PLB with exertion. Long: use PLB on exertion proficiently and independently.             Initial Exercise  Prescription:  Initial Exercise Prescription - 10/31/22 1400       Date of Initial Exercise RX and Referring Provider   Date 10/31/22    Referring Provider Dr. Delton Prairie      Oxygen   Maintain Oxygen Saturation 88% or higher      Treadmill   MPH 2.6    Grade 0    Minutes 15    METs 2.99      Recumbant Bike   Level 3    RPM 50    Watts 50    Minutes 15    METs 4.34      NuStep   Level 3    SPM 80    Minutes 15    METs 4.34      T5 Nustep   Level 3    SPM 80    Minutes 15    METs 4.34      Prescription Details   Frequency (times per week) 2    Duration Progress to 30 minutes of continuous aerobic without signs/symptoms of physical distress      Intensity   THRR 40-80% of Max Heartrate 98-139    Ratings of Perceived Exertion 11-13    Perceived Dyspnea 0-4      Progression   Progression Continue to progress workloads to maintain intensity without signs/symptoms of physical distress.      Resistance Training   Training Prescription Yes    Weight 5 lb    Reps 10-15             Perform Capillary Blood Glucose checks as needed.  Exercise Prescription Changes:   Exercise Prescription Changes     Row Name 10/31/22 1400 11/17/22 1000 12/01/22 1000 12/14/22 1500       Response to Exercise    Blood Pressure (Admit) 160/100 180/90 156/88 142/62    Blood Pressure (Exercise) 218/110 190/90 158/86 160/80    Blood Pressure (Exit) 180/100 132/62 154/74 128/64    Heart Rate (Admit) 57 bpm 66 bpm 69 bpm 82 bpm    Heart Rate (Exercise) 86 bpm 99 bpm 86 bpm 100 bpm    Heart Rate (Exit) 59 bpm 72 bpm 76 bpm 85 bpm    Oxygen Saturation (Admit) 94 % 95 % 96 % 92 %    Oxygen Saturation (Exercise) 77 % 90 % 94 % 88 %    Oxygen Saturation (Exit) 97 % 93 % 96 % 92 %    Rating of Perceived Exertion (Exercise) 11 13 12 14     Perceived Dyspnea (Exercise) 0 0 2 0    Symptoms BP was high today, no symptoms from it, but waited for it to go down prior to -- none none    Comments results First full week of exercise -- --    Duration Progress to 30 minutes of  aerobic without signs/symptoms of physical distress Continue with 30 min of aerobic exercise without signs/symptoms of physical distress. Continue with 30 min of aerobic exercise without signs/symptoms of physical distress. Continue with 30 min of aerobic exercise without signs/symptoms of physical distress.    Intensity THRR New THRR New THRR New THRR New      Progression   Progression Continue to progress workloads to maintain intensity without signs/symptoms of physical distress. Continue to progress workloads to maintain intensity without signs/symptoms of physical distress. Continue to progress workloads to maintain intensity without signs/symptoms of physical distress. Continue to progress workloads to maintain intensity without signs/symptoms  of physical distress.    Average METs 4.34 4.34 2.71 2.93      Resistance Training   Training Prescription -- Yes Yes Yes    Weight -- 5 lb 5 lb 5 lb    Reps -- 10-15 10-15 10-15      Interval Training   Interval Training -- No No No      Treadmill   MPH -- 2.6 -- 2    Grade -- 0 -- 0    Minutes -- 15 -- 15    METs -- 2.99 -- 2.53      Recumbant Bike   Level -- 3 3 1     Watts -- 22  22 20     Minutes -- 15 15 15     METs -- 2.82 2.81 2.74      NuStep   Level -- 3 -- 4    Minutes -- 15 -- 15    METs -- 2.4 -- 3.9      REL-XR   Level -- -- 2 3    Minutes -- -- 30 15    METs -- -- 3.5 --      T5 Nustep   Level -- 3 3 --    Minutes -- 15 15 --    METs -- 2.2 2.3 --      Oxygen   Maintain Oxygen Saturation -- 88% or higher 88% or higher 88% or higher             Exercise Comments:   Exercise Comments     Row Name 11/02/22 1134           Exercise Comments First full day of exercise!  Patient was oriented to gym and equipment including functions, settings, policies, and procedures.  Patient's individual exercise prescription and treatment plan were reviewed.  All starting workloads were established based on the results of the 6 minute walk test done at initial orientation visit.  The plan for exercise progression was also introduced and progression will be customized based on patient's performance and goals.                Exercise Goals and Review:   Exercise Goals     Row Name 10/31/22 1426             Exercise Goals   Increase Physical Activity Yes       Intervention Provide advice, education, support and counseling about physical activity/exercise needs.;Develop an individualized exercise prescription for aerobic and resistive training based on initial evaluation findings, risk stratification, comorbidities and participant's personal goals.       Expected Outcomes Short Term: Attend rehab on a regular basis to increase amount of physical activity.;Long Term: Add in home exercise to make exercise part of routine and to increase amount of physical activity.;Long Term: Exercising regularly at least 3-5 days a week.       Increase Strength and Stamina Yes       Intervention Provide advice, education, support and counseling about physical activity/exercise needs.;Develop an individualized exercise prescription for aerobic and resistive training  based on initial evaluation findings, risk stratification, comorbidities and participant's personal goals.       Expected Outcomes Short Term: Increase workloads from initial exercise prescription for resistance, speed, and METs.;Short Term: Perform resistance training exercises routinely during rehab and add in resistance training at home;Long Term: Improve cardiorespiratory fitness, muscular endurance and strength as measured by increased METs and functional capacity ( )  Able to understand and use rate of perceived exertion (RPE) scale Yes       Intervention Provide education and explanation on how to use RPE scale       Expected Outcomes Short Term: Able to use RPE daily in rehab to express subjective intensity level;Long Term:  Able to use RPE to guide intensity level when exercising independently       Able to understand and use Dyspnea scale Yes       Intervention Provide education and explanation on how to use Dyspnea scale       Expected Outcomes Short Term: Able to use Dyspnea scale daily in rehab to express subjective sense of shortness of breath during exertion;Long Term: Able to use Dyspnea scale to guide intensity level when exercising independently       Knowledge and understanding of Target Heart Rate Range (THRR) Yes       Intervention Provide education and explanation of THRR including how the numbers were predicted and where they are located for reference       Expected Outcomes Short Term: Able to state/look up THRR;Short Term: Able to use daily as guideline for intensity in rehab;Long Term: Able to use THRR to govern intensity when exercising independently       Able to check pulse independently Yes       Intervention Provide education and demonstration on how to check pulse in carotid and radial arteries.;Review the importance of being able to check your own pulse for safety during independent exercise       Expected Outcomes Short Term: Able to explain why pulse checking  is important during independent exercise;Long Term: Able to check pulse independently and accurately       Understanding of Exercise Prescription Yes       Intervention Provide education, explanation, and written materials on patient's individual exercise prescription       Expected Outcomes Short Term: Able to explain program exercise prescription;Long Term: Able to explain home exercise prescription to exercise independently                Exercise Goals Re-Evaluation :  Exercise Goals Re-Evaluation     Row Name 11/02/22 1134 11/17/22 1036 12/01/22 1037 12/14/22 1559       Exercise Goal Re-Evaluation   Exercise Goals Review Able to understand and use rate of perceived exertion (RPE) scale;Able to understand and use Dyspnea scale;Knowledge and understanding of Target Heart Rate Range (THRR);Understanding of Exercise Prescription Understanding of Exercise Prescription;Increase Physical Activity;Increase Strength and Stamina Understanding of Exercise Prescription;Increase Physical Activity;Increase Strength and Stamina Understanding of Exercise Prescription;Increase Physical Activity;Increase Strength and Stamina    Comments Reviewed RPE  and dyspnea scale, THR and program prescription with pt today.  Pt voiced understanding and was given a copy of goals to take home. Keimani is off to a good start in the program. He is doing well with the treadmill at a speed of 2.6 mph with no incline. He also has done well with the recumbent bike, T4 nustep, and T5 nustep at level 3. We will continue to monitor his progress in the program. Deakyn is doing well in the program. He continues to work at level 3 on the recumbent bike and T5 nustep. He also began using the XR at level 2 and did wellwith it. He has not done any walking in rehab at this time. We will continue to monitor his progress in the program. Jaedin continues to do well in rehab.  He increased to level 4 on the T4 nustep and level 3 on the XR. He  also decreased his speed on the treadmill from 2.6 mph down to 2 mph with no incline. We will continue to monitor his progress in the program.    Expected Outcomes Short: Use RPE daily to regulate intensity. Long: Follow program prescription in THR. Short: Continue to follow current exercise prescription. Long: Continue exercise to improve strength and stamina. Short: Begin walking treadmill or track in rehab. Long: Continue exercise to improve strength and stamina. Short: Increase treadmill workload back up to previous speed. Long: Continue exercise to improve strength and stamina.             Discharge Exercise Prescription (Final Exercise Prescription Changes):  Exercise Prescription Changes - 12/14/22 1500       Response to Exercise   Blood Pressure (Admit) 142/62    Blood Pressure (Exercise) 160/80    Blood Pressure (Exit) 128/64    Heart Rate (Admit) 82 bpm    Heart Rate (Exercise) 100 bpm    Heart Rate (Exit) 85 bpm    Oxygen Saturation (Admit) 92 %    Oxygen Saturation (Exercise) 88 %    Oxygen Saturation (Exit) 92 %    Rating of Perceived Exertion (Exercise) 14    Perceived Dyspnea (Exercise) 0    Symptoms none    Duration Continue with 30 min of aerobic exercise without signs/symptoms of physical distress.    Intensity THRR New      Progression   Progression Continue to progress workloads to maintain intensity without signs/symptoms of physical distress.    Average METs 2.93      Resistance Training   Training Prescription Yes    Weight 5 lb    Reps 10-15      Interval Training   Interval Training No      Treadmill   MPH 2    Grade 0    Minutes 15    METs 2.53      Recumbant Bike   Level 1    Watts 20    Minutes 15    METs 2.74      NuStep   Level 4    Minutes 15    METs 3.9      REL-XR   Level 3    Minutes 15      Oxygen   Maintain Oxygen Saturation 88% or higher             Nutrition:  Target Goals: Understanding of nutrition  guidelines, daily intake of sodium 1500mg , cholesterol 200mg , calories 30% from fat and 7% or less from saturated fats, daily to have 5 or more servings of fruits and vegetables.  Education: All About Nutrition: -Group instruction provided by verbal, written material, interactive activities, discussions, models, and posters to present general guidelines for heart healthy nutrition including fat, fiber, MyPlate, the role of sodium in heart healthy nutrition, utilization of the nutrition label, and utilization of this knowledge for meal planning. Follow up email sent as well. Written material given at graduation.   Biometrics:  Pre Biometrics - 10/31/22 1427       Pre Biometrics   Height 6' 1.5" (1.867 m)    Weight 185 lb 1.6 oz (84 kg)    Waist Circumference 37 inches    Hip Circumference 42 inches    Waist to Hip Ratio 0.88 %    BMI (Calculated) 24.09    Single Leg Stand 30  seconds              Nutrition Therapy Plan and Nutrition Goals:  Nutrition Therapy & Goals - 10/31/22 1428       Nutrition Therapy   RD appointment deferred Yes      Personal Nutrition Goals   Nutrition Goal RD appointment deferred, already met with one with dialysis      Intervention Plan   Intervention Prescribe, educate and counsel regarding individualized specific dietary modifications aiming towards targeted core components such as weight, hypertension, lipid management, diabetes, heart failure and other comorbidities.;Nutrition handout(s) given to patient.    Expected Outcomes Short Term Goal: Understand basic principles of dietary content, such as calories, fat, sodium, cholesterol and nutrients.;Long Term Goal: Adherence to prescribed nutrition plan.;Short Term Goal: A plan has been developed with personal nutrition goals set during dietitian appointment.             Nutrition Assessments:  MEDIFICTS Score Key: >=70 Need to make dietary changes  40-70 Heart Healthy Diet <= 40  Therapeutic Level Cholesterol Diet   Picture Your Plate Scores: <95 Unhealthy dietary pattern with much room for improvement. 41-50 Dietary pattern unlikely to meet recommendations for good health and room for improvement. 51-60 More healthful dietary pattern, with some room for improvement.  >60 Healthy dietary pattern, although there may be some specific behaviors that could be improved.   Nutrition Goals Re-Evaluation:  Nutrition Goals Re-Evaluation     Row Name 12/14/22 1117             Goals   Comment Patient was informed on why it is important to maintain a balanced diet when dealing with Respiratory issues. Explained that it takes a lot of energy to breath and when they are short of breath often they will need to have a good diet to help keep up with the calories they are expending for breathing.       Expected Outcome Short: Choose and plan snacks accordingly to patients caloric intake to improve breathing. Long: Maintain a diet independently that meets their caloric intake to aid in daily shortness of breath.                Nutrition Goals Discharge (Final Nutrition Goals Re-Evaluation):  Nutrition Goals Re-Evaluation - 12/14/22 1117       Goals   Comment Patient was informed on why it is important to maintain a balanced diet when dealing with Respiratory issues. Explained that it takes a lot of energy to breath and when they are short of breath often they will need to have a good diet to help keep up with the calories they are expending for breathing.    Expected Outcome Short: Choose and plan snacks accordingly to patients caloric intake to improve breathing. Long: Maintain a diet independently that meets their caloric intake to aid in daily shortness of breath.             Psychosocial: Target Goals: Acknowledge presence or absence of significant depression and/or stress, maximize coping skills, provide positive support system. Participant is able to verbalize  types and ability to use techniques and skills needed for reducing stress and depression.   Education: Stress, Anxiety, and Depression - Group verbal and visual presentation to define topics covered.  Reviews how body is impacted by stress, anxiety, and depression.  Also discusses healthy ways to reduce stress and to treat/manage anxiety and depression.  Written material given at graduation.   Education: Sleep Hygiene -Provides  group verbal and written instruction about how sleep can affect your health.  Define sleep hygiene, discuss sleep cycles and impact of sleep habits. Review good sleep hygiene tips.    Initial Review & Psychosocial Screening:  Initial Psych Review & Screening - 10/12/22 0914       Initial Review   Current issues with None Identified      Family Dynamics   Good Support System? Yes    Comments Patient reports no issues with their current mental states, sleep, stress, depression or anxiety. Will follow up with patient in a few weeks for any changes.      Barriers   Psychosocial barriers to participate in program There are no identifiable barriers or psychosocial needs.      Screening Interventions   Interventions Encouraged to exercise;To provide support and resources with identified psychosocial needs;Provide feedback about the scores to participant    Expected Outcomes Short Term goal: Utilizing psychosocial counselor, staff and physician to assist with identification of specific Stressors or current issues interfering with healing process. Setting desired goal for each stressor or current issue identified.;Long Term Goal: Stressors or current issues are controlled or eliminated.;Short Term goal: Identification and review with participant of any Quality of Life or Depression concerns found by scoring the questionnaire.;Long Term goal: The participant improves quality of Life and PHQ9 Scores as seen by post scores and/or verbalization of changes              Quality of Life Scores:  Scores of 19 and below usually indicate a poorer quality of life in these areas.  A difference of  2-3 points is a clinically meaningful difference.  A difference of 2-3 points in the total score of the Quality of Life Index has been associated with significant improvement in overall quality of life, self-image, physical symptoms, and general health in studies assessing change in quality of life.  PHQ-9: Review Flowsheet  More data exists      12/14/2022 10/31/2022 04/25/2022 10/05/2020 09/16/2020  Depression screen PHQ 2/9  Decreased Interest 0 2 0 0 0  Down, Depressed, Hopeless 0 0 0 0 0  PHQ - 2 Score 0 2 0 0 0  Altered sleeping 0 1 0 - -  Tired, decreased energy 2 2 1  - -  Change in appetite 0 2 0 - -  Feeling bad or failure about yourself  0 0 0 - -  Trouble concentrating 0 0 0 - -  Moving slowly or fidgety/restless 0 0 1 - -  Suicidal thoughts 0 0 0 - -  PHQ-9 Score 2 7 2  - -  Difficult doing work/chores Not difficult at all - - - -    Details           Interpretation of Total Score  Total Score Depression Severity:  1-4 = Minimal depression, 5-9 = Mild depression, 10-14 = Moderate depression, 15-19 = Moderately severe depression, 20-27 = Severe depression   Psychosocial Evaluation and Intervention:  Psychosocial Evaluation - 10/12/22 0915       Psychosocial Evaluation & Interventions   Interventions Encouraged to exercise with the program and follow exercise prescription;Relaxation education;Stress management education    Comments Patient reports no issues with their current mental states, sleep, stress, depression or anxiety. Will follow up with patient in a few weeks for any changes.    Expected Outcomes Short: Exercise regularly to support mental health and notify staff of any changes. Long: maintain mental health and well being  through teaching of rehab or prescribed medications independently.    Continue Psychosocial Services  Follow up  required by staff             Psychosocial Re-Evaluation:  Psychosocial Re-Evaluation     Row Name 12/14/22 1122             Psychosocial Re-Evaluation   Current issues with None Identified       Comments Reviewed patient health questionnaire (PHQ-9) with patient for follow up. Previously, patients score indicated signs/symptoms of depression.  Reviewed to see if patient is improving symptom wise while in program.  Score improved and patient states that it is because he has been exercising more.       Expected Outcomes Short: Continue to attend [facility] regularly for regular exercise and social engagement. Long: Continue to improve symptoms and manage a positive mental state.       Interventions Encouraged to attend Pulmonary Rehabilitation for the exercise       Continue Psychosocial Services  Follow up required by staff                Psychosocial Discharge (Final Psychosocial Re-Evaluation):  Psychosocial Re-Evaluation - 12/14/22 1122       Psychosocial Re-Evaluation   Current issues with None Identified    Comments Reviewed patient health questionnaire (PHQ-9) with patient for follow up. Previously, patients score indicated signs/symptoms of depression.  Reviewed to see if patient is improving symptom wise while in program.  Score improved and patient states that it is because he has been exercising more.    Expected Outcomes Short: Continue to attend [facility] regularly for regular exercise and social engagement. Long: Continue to improve symptoms and manage a positive mental state.    Interventions Encouraged to attend Pulmonary Rehabilitation for the exercise    Continue Psychosocial Services  Follow up required by staff             Education: Education Goals: Education classes will be provided on a weekly basis, covering required topics. Participant will state understanding/return demonstration of topics presented.  Learning Barriers/Preferences:  Learning  Barriers/Preferences - 10/12/22 0914       Learning Barriers/Preferences   Learning Barriers None    Learning Preferences None             General Pulmonary Education Topics:  Infection Prevention: - Provides verbal and written material to individual with discussion of infection control including proper hand washing and proper equipment cleaning during exercise session. Flowsheet Row Pulmonary Rehab from 10/31/2022 in [facility] Cardiac and Pulmonary Rehab  Date 10/31/22  Educator MB  Instruction Review Code 1- Verbalizes Understanding       Falls Prevention: - Provides verbal and written material to individual with discussion of falls prevention and safety. Flowsheet Row Pulmonary Rehab from 10/31/2022 in [facility] Cardiac and Pulmonary Rehab  Date 10/31/22  Educator MB  Instruction Review Code 1- Verbalizes Understanding       Chronic Lung Disease Review: - Group verbal instruction with posters, models, PowerPoint presentations and videos,  to review new updates, new respiratory medications, new advancements in procedures and treatments. Providing information on websites and "800" numbers for continued self-education. Includes information about supplement oxygen, available portable oxygen systems, continuous and intermittent flow rates, oxygen safety, concentrators, and Medicare reimbursement for oxygen. Explanation of Pulmonary Drugs, including class, frequency, complications, importance of spacers, rinsing mouth after steroid MDI's, and proper cleaning methods for nebulizers. Review of basic lung anatomy and physiology related to  function, structure, and complications of lung disease. Review of risk factors. Discussion about methods for diagnosing sleep apnea and types of masks and machines for OSA. Includes a review of the use of types of environmental controls: home humidity, furnaces, filters, dust mite/pet prevention, HEPA vacuums. Discussion about weather changes, air quality and the  benefits of nasal washing. Instruction on Warning signs, infection symptoms, calling MD promptly, preventive modes, and value of vaccinations. Review of effective airway clearance, coughing and/or vibration techniques. Emphasizing that all should Create an Action Plan. Written material given at graduation.   AED/CPR: - Group verbal and written instruction with the use of models to demonstrate the basic use of the AED with the basic ABC's of resuscitation.    Anatomy and Cardiac Procedures: - Group verbal and visual presentation and models provide information about basic cardiac anatomy and function. Reviews the testing methods done to diagnose heart disease and the outcomes of the test results. Describes the treatment choices: Medical Management, Angioplasty, or Coronary Bypass Surgery for treating various heart conditions including Myocardial Infarction, Angina, Valve Disease, and Cardiac Arrhythmias.  Written material given at graduation.   Medication Safety: - Group verbal and visual instruction to review commonly prescribed medications for heart and lung disease. Reviews the medication, class of the drug, and side effects. Includes the steps to properly store meds and maintain the prescription regimen.  Written material given at graduation.   Other: -Provides group and verbal instruction on various topics (see comments)   Knowledge Questionnaire Score:    Core Components/Risk Factors/Patient Goals at Admission:  Personal Goals and Risk Factors at Admission - 10/31/22 1429       Core Components/Risk Factors/Patient Goals on Admission    Weight Management Yes;Weight Maintenance    Intervention Weight Management: Develop a combined nutrition and exercise program designed to reach desired caloric intake, while maintaining appropriate intake of nutrient and fiber, sodium and fats, and appropriate energy expenditure required for the weight goal.;Weight Management: Provide education and  appropriate resources to help participant work on and attain dietary goals.;Weight Management/Obesity: Establish reasonable short term and long term weight goals.    Admit Weight 185 lb 1.6 oz (84 kg)    Goal Weight: Short Term 185 lb 1.6 oz (84 kg)    Goal Weight: Long Term 185 lb 1.6 oz (84 kg)    Expected Outcomes Short Term: Continue to assess and modify interventions until short term weight is achieved;Long Term: Adherence to nutrition and physical activity/exercise program aimed toward attainment of established weight goal;Weight Maintenance: Understanding of the daily nutrition guidelines, which includes 25-35% calories from fat, 7% or less cal from saturated fats, less than 200mg  cholesterol, less than 1.5gm of sodium, & 5 or more servings of fruits and vegetables daily;Understanding recommendations for meals to include 15-35% energy as protein, 25-35% energy from fat, 35-60% energy from carbohydrates, less than 200mg  of dietary cholesterol, 20-35 gm of total fiber daily;Understanding of distribution of calorie intake throughout the day with the consumption of 4-5 meals/snacks    Improve shortness of breath with ADL's Yes    Intervention Provide education, individualized exercise plan and daily activity instruction to help decrease symptoms of SOB with activities of daily living.    Expected Outcomes Short Term: Improve cardiorespiratory fitness to achieve a reduction of symptoms when performing ADLs;Long Term: Be able to perform more ADLs without symptoms or delay the onset of symptoms    Heart Failure Yes    Intervention Provide a combined exercise and  nutrition program that is supplemented with education, support and counseling about heart failure. Directed toward relieving symptoms such as shortness of breath, decreased exercise tolerance, and extremity edema.    Expected Outcomes Improve functional capacity of life;Short term: Attendance in program 2-3 days a week with increased exercise  capacity. Reported lower sodium intake. Reported increased fruit and vegetable intake. Reports medication compliance.;Short term: Daily weights obtained and reported for increase. Utilizing diuretic protocols set by physician.;Long term: Adoption of self-care skills and reduction of barriers for early signs and symptoms recognition and intervention leading to self-care maintenance.    Hypertension Yes    Intervention Provide education on lifestyle modifcations including regular physical activity/exercise, weight management, moderate sodium restriction and increased consumption of fresh fruit, vegetables, and low fat dairy, alcohol moderation, and smoking cessation.;Monitor prescription use compliance.    Expected Outcomes Short Term: Continued assessment and intervention until BP is < 140/84mm HG in hypertensive participants. < 130/21mm HG in hypertensive participants with diabetes, heart failure or chronic kidney disease.;Long Term: Maintenance of blood pressure at goal levels.             Education:Diabetes - Individual verbal and written instruction to review signs/symptoms of diabetes, desired ranges of glucose level fasting, after meals and with exercise. Acknowledge that pre and post exercise glucose checks will be done for 3 sessions at entry of program.   Know Your Numbers and Heart Failure: - Group verbal and visual instruction to discuss disease risk factors for cardiac and pulmonary disease and treatment options.  Reviews associated critical values for Overweight/Obesity, Hypertension, Cholesterol, and Diabetes.  Discusses basics of heart failure: signs/symptoms and treatments.  Introduces Heart Failure Zone chart for action plan for heart failure.  Written material given at graduation.   Core Components/Risk Factors/Patient Goals Review:   Goals and Risk Factor Review     Row Name 12/14/22 1116             Core Components/Risk Factors/Patient Goals Review   Personal Goals  Review Improve shortness of breath with ADL's       Review Spoke to patient about their shortness of breath and what they can do to improve. Patient has been informed of breathing techniques when starting the program. Patient is informed to tell staff if they have had any med changes and that certain meds they are taking or not taking can be causing shortness of breath.       Expected Outcomes Short: Attend [facility] regularly to improve shortness of breath with ADL's. Long: maintain independence with ADL's                Core Components/Risk Factors/Patient Goals at Discharge (Final Review):   Goals and Risk Factor Review - 12/14/22 1116       Core Components/Risk Factors/Patient Goals Review   Personal Goals Review Improve shortness of breath with ADL's    Review Spoke to patient about their shortness of breath and what they can do to improve. Patient has been informed of breathing techniques when starting the program. Patient is informed to tell staff if they have had any med changes and that certain meds they are taking or not taking can be causing shortness of breath.    Expected Outcomes Short: Attend [facility] regularly to improve shortness of breath with ADL's. Long: maintain independence with ADL's             ITP Comments:  ITP Comments     Row Name 10/12/22 [phone] 10/31/22 1413  11/02/22 1134 11/15/22 1239 12/13/22 0855   ITP Comments Virtual Visit completed. Patient informed on EP and RD appointment and 6 Minute walk test. Patient also informed of patient health questionnaires on My Chart. Patient Verbalizes understanding. Visit diagnosis can be found in [facility] 10/31/2022. Completed and gym orientation. Initial ITP created and sent for review to Dr. Vida Rigger, Medical Director. First full day of exercise!  Patient was oriented to gym and equipment including functions, settings, policies, and procedures.  Patient's individual exercise prescription and treatment plan were  reviewed.  All starting workloads were established based on the results of the 6 minute walk test done at initial orientation visit.  The plan for exercise progression was also introduced and progression will be customized based on patient's performance and goals. 30 Day review completed. Medical Director ITP review done, changes made as directed, and signed approval by Medical Director.    new to program 30 Day review completed. Medical Director ITP review done, changes made as directed, and signed approval by Medical Director.    Row Name 01/03/23 0933           ITP Comments 30 Day review completed. Medical Director ITP review done, changes made as directed, and signed approval by Medical Director.                Comments:

## 2023-01-04 ENCOUNTER — Encounter: Payer: Medicare HMO | Admitting: *Deleted

## 2023-01-12 DIAGNOSIS — I959 Hypotension, unspecified: Principal | ICD-10-CM

## 2023-01-12 MED ORDER — MIDODRINE 5 MG TABLET
ORAL_TABLET | Freq: Every day | ORAL | 3 refills | 0 days | Status: CP | PRN
Start: 2023-01-12 — End: 2024-01-12

## 2023-01-16 ENCOUNTER — Encounter: Payer: Medicare HMO | Attending: Pediatric Pulmonology | Admitting: *Deleted

## 2023-01-16 DIAGNOSIS — I5022 Chronic systolic (congestive) heart failure: Secondary | ICD-10-CM | POA: Insufficient documentation

## 2023-01-16 NOTE — Progress Notes (Signed)
Daily Session Note  Patient Details  Name: Louis [name] MRN: [id number] Date of Birth: 05/22/1960 Referring Provider:   Flowsheet Row Pulmonary Rehab from 10/31/2022 in [facility] Cardiac and Pulmonary Rehab  Referring Provider Dr. Delton Prairie       Encounter Date: 01/16/2023  Check In:  Session Check In - 01/16/23 1149       Check-In   Supervising physician immediately available to respond to emergencies See telemetry face sheet for immediately available ER MD    Location [facility]-Cardiac & Pulmonary Rehab    Staff Present Cora Collum, RN, BSN, CCRP;Jason Wallace Cullens, RDN, LDN;Meredith Jewel Baize, RN BSN;Maxon Conetta BS, , Exercise Physiologist    Virtual Visit No    Medication changes reported     No    Fall or balance concerns reported    No    Warm-up and Cool-down Performed on first and last piece of equipment    Resistance Training Performed Yes    VAD Patient? No    PAD/SET Patient? No      Pain Assessment   Currently in Pain? No/denies                Social History   Tobacco Use  Smoking Status Never  Smokeless Tobacco Never    Goals Met:  Proper associated with RPD/PD & O2 Sat Independence with exercise equipment Exercise tolerated well No report of concerns or symptoms today  Goals Unmet:  Not Applicable  Comments: Pt able to follow exercise prescription today without complaint.  Will continue to monitor for progression. Returning after being out for medical reason   Dr. Bethann Punches is Medical Director for [facility] Cardiac Rehabilitation.  Dr. Vida Rigger is Medical Director for [facility] Pulmonary Rehabilitation.

## 2023-01-22 DIAGNOSIS — L739 Follicular disorder, unspecified: Principal | ICD-10-CM

## 2023-01-22 MED ORDER — DOXYCYCLINE HYCLATE 100 MG CAPSULE
ORAL_CAPSULE | Freq: Two times a day (BID) | ORAL | 6 refills | 30.00 days
Start: 2023-01-22 — End: ?

## 2023-01-23 DIAGNOSIS — L739 Follicular disorder, unspecified: Principal | ICD-10-CM

## 2023-01-23 MED ORDER — DOXYCYCLINE HYCLATE 100 MG CAPSULE
ORAL_CAPSULE | Freq: Two times a day (BID) | ORAL | 5 refills | 30.00 days
Start: 2023-01-23 — End: ?

## 2023-01-24 MED ORDER — DOXYCYCLINE HYCLATE 100 MG CAPSULE
ORAL_CAPSULE | Freq: Two times a day (BID) | ORAL | 5 refills | 30.00 days | Status: CP
Start: 2023-01-24 — End: ?

## 2023-01-25 ENCOUNTER — Encounter: Payer: Medicare HMO | Admitting: *Deleted

## 2023-01-26 MED ORDER — DOXYCYCLINE HYCLATE 100 MG TABLET
ORAL_TABLET | Freq: Two times a day (BID) | ORAL | 5 refills | 30.00 days
Start: 2023-01-26 — End: ?

## 2023-01-27 ENCOUNTER — Emergency Department: Payer: Medicare HMO

## 2023-01-27 ENCOUNTER — Other Ambulatory Visit: Payer: Self-pay

## 2023-01-27 ENCOUNTER — Emergency Department
Admission: EM | Admit: 2023-01-27 | Discharge: 2023-01-27 | Disposition: A | Discharge status: Home / Self Care | Payer: Medicare HMO | Attending: Emergency Medicine | Admitting: Emergency Medicine

## 2023-01-27 DIAGNOSIS — R0609 Other forms of dyspnea: Secondary | ICD-10-CM | POA: Diagnosis present

## 2023-01-27 DIAGNOSIS — N186 End stage renal disease: Secondary | ICD-10-CM | POA: Diagnosis not present

## 2023-01-27 DIAGNOSIS — Z992 Dependence on renal dialysis: Secondary | ICD-10-CM | POA: Insufficient documentation

## 2023-01-27 DIAGNOSIS — R0602 Shortness of breath: Secondary | ICD-10-CM | POA: Diagnosis not present

## 2023-01-27 DIAGNOSIS — M7989 Other specified soft tissue disorders: Secondary | ICD-10-CM

## 2023-01-27 DIAGNOSIS — I509 Heart failure, unspecified: Secondary | ICD-10-CM | POA: Diagnosis not present

## 2023-01-27 DIAGNOSIS — Z21 Asymptomatic human immunodeficiency virus [HIV] infection status: Secondary | ICD-10-CM | POA: Diagnosis not present

## 2023-01-27 LAB — CBC
HCT: 29.4 % — ABNORMAL LOW (ref 39.0–52.0)
Hemoglobin: 10.2 g/dL — ABNORMAL LOW (ref 13.0–17.0)
MCH: 35.4 pg — ABNORMAL HIGH (ref 26.0–34.0)
MCHC: 34.7 g/dL (ref 30.0–36.0)
MCV: 102.1 fL — ABNORMAL HIGH (ref 80.0–100.0)
Platelets: 178 10*3/uL (ref 150–400)
RBC: 2.88 MIL/uL — ABNORMAL LOW (ref 4.22–5.81)
RDW: 13.5 % (ref 11.5–15.5)
WBC: 12.2 10*3/uL — ABNORMAL HIGH (ref 4.0–10.5)
nRBC: 0 % (ref 0.0–0.2)

## 2023-01-27 LAB — COMPREHENSIVE METABOLIC PANEL
ALT: 11 U/L (ref 0–44)
AST: 19 U/L (ref 15–41)
Albumin: 3.8 g/dL (ref 3.5–5.0)
Alkaline Phosphatase: 61 U/L (ref 38–126)
Anion gap: 16 — ABNORMAL HIGH (ref 5–15)
BUN: 41 mg/dL — ABNORMAL HIGH (ref 6–20)
CO2: 28 mmol/L (ref 22–32)
Calcium: 7.6 mg/dL — ABNORMAL LOW (ref 8.9–10.3)
Chloride: 87 mmol/L — ABNORMAL LOW (ref 98–111)
Creatinine, Ser: 13.47 mg/dL — ABNORMAL HIGH (ref 0.61–1.24)
GFR, Estimated: 4 mL/min — ABNORMAL LOW (ref >60)
Glucose, Bld: 106 mg/dL — ABNORMAL HIGH (ref 70–99)
Potassium: 4.2 mmol/L (ref 3.5–5.1)
Sodium: 131 mmol/L — ABNORMAL LOW (ref 135–145)
Total Bilirubin: 1.2 mg/dL — ABNORMAL HIGH (ref <1.2)
Total Protein: 8 g/dL (ref 6.5–8.1)

## 2023-01-27 LAB — TROPONIN I (HIGH SENSITIVITY)
Troponin I (High Sensitivity): 23 ng/L — ABNORMAL HIGH (ref <18)
Troponin I (High Sensitivity): 22 ng/L — ABNORMAL HIGH (ref <18)

## 2023-01-27 MED ORDER — ALUM & MAG HYDROXIDE-SIMETH 200-200-20 MG/5ML PO SUSP
30.0000 mL | Freq: Once | ORAL | Status: AC
  Administered 2023-01-27: 30 mL via ORAL
  Filled 2023-01-27: qty 30

## 2023-01-27 MED ORDER — LIDOCAINE VISCOUS HCL 2 % MT SOLN
15.0000 mL | Freq: Once | OROMUCOSAL | Status: AC
  Administered 2023-01-27: 15 mL via ORAL
  Filled 2023-01-27: qty 15

## 2023-01-27 MED ORDER — IOHEXOL 350 MG/ML SOLN
75.0000 mL | Freq: Once | INTRAVENOUS | Status: AC | PRN
  Administered 2023-01-27: 75 mL via INTRAVENOUS

## 2023-01-27 NOTE — ED Notes (Signed)
Patient declined discharge vital signs. 

## 2023-01-27 NOTE — ED Triage Notes (Signed)
Pt to ED from [facility] for pain to back with inspiration x1 month. Also reports pain to back of bilateral thighs x 1 week. Reports swelling to BLE +shob with exertion

## 2023-01-27 NOTE — ED Provider Notes (Signed)
[facility] Provider Note    Event Date/Time   First MD Initiated Contact with Patient 01/27/23 1208     (approximate)   History   Chief Complaint Leg Pain and Shortness of Breath   HPI  Louis [name] is a 62 y.o. male with past medical history of HIV, ESRD on HD (MWF), anemia, and CHF who presents to the ED complaining of leg pain and shortness of breath.  Patient reports that he has had some swelling as well as discomfort in the posterior portion of both thighs for about the past 2 weeks.  He describes feeling some "knots" to the back of each thigh and is concerned that he could have a clot.  He also reports being more fatigued than usual, describes shortness of breath with mild exertion but denies any pain in his chest.  He has not had any fevers or cough.  He denies any redness or rash to his legs.  He performs home hemodialysis, had a full treatment yesterday with no issue.     Physical Exam   Triage Vital Signs: ED Triage Vitals  Encounter Vitals Group     BP 01/27/23 1027 (!) 92/58     Systolic BP Percentile --      Diastolic BP Percentile --      Pulse Rate 01/27/23 1028 87     Resp 01/27/23 1025 20     Temp 01/27/23 1022 98.3 F (36.8 C)     Temp src --      SpO2 01/27/23 1028 94 %     Weight 01/27/23 1026 187 lb (84.8 kg)     Height 01/27/23 1026 6\' 2"  (1.88 m)     Head Circumference --      Peak Flow --      Pain Score 01/27/23 1026 10     Pain Loc --      Pain Education --      Exclude from Growth Chart --     Most recent vital signs: Vitals:   01/27/23 1238 01/27/23 1450  BP: 104/67 110/65  Pulse: 73 74  Resp: 18 18  Temp:    SpO2: 100% 100%    Constitutional: Alert and oriented. Eyes: Conjunctivae are normal. Head: Atraumatic. Nose: No congestion/rhinnorhea. Mouth/Throat: Mucous membranes are moist.  Cardiovascular: Normal rate, regular rhythm. Grossly normal heart sounds.  2+ radial pulses bilaterally.  Left  forearm AV fistula with palpable thrill. Respiratory: Normal respiratory effort.  No retractions. Lungs CTAB. Gastrointestinal: Soft and nontender. No distention. Musculoskeletal: Trace pitting edema to bilateral lower extremities, tenderness to posterior thighs bilaterally with some palpable nodules, no overlying erythema or warmth. Neurologic:  Normal speech and language. No gross focal neurologic deficits are appreciated.    ED Results / Procedures / Treatments   Labs (all labs ordered are listed, but only abnormal results are displayed) Labs Reviewed  CBC - Abnormal; Notable for the following components:      Result Value   WBC 12.2 (*)    RBC 2.88 (*)    Hemoglobin 10.2 (*)    HCT 29.4 (*)    MCV 102.1 (*)    MCH 35.4 (*)    All other components within normal limits  COMPREHENSIVE METABOLIC PANEL - Abnormal; Notable for the following components:   Sodium 131 (*)    Chloride 87 (*)    Glucose, Bld 106 (*)    BUN 41 (*)    Creatinine, Ser 13.47 (*)  Calcium 7.6 (*)    Total Bilirubin 1.2 (*)    GFR, Estimated 4 (*)    Anion gap 16 (*)    All other components within normal limits  TROPONIN I (HIGH SENSITIVITY) - Abnormal; Notable for the following components:   Troponin I (High Sensitivity) 23 (*)    All other components within normal limits  TROPONIN I (HIGH SENSITIVITY) - Abnormal; Notable for the following components:   Troponin I (High Sensitivity) 22 (*)    All other components within normal limits     EKG  ED ECG REPORT I, Chesley Noon, the attending physician, personally viewed and interpreted this ECG.   Date: 01/27/2023  EKG Time: 10:36  Rate: 80  Rhythm: normal sinus rhythm  Axis: Normal  Intervals:none  ST&T Change: None  RADIOLOGY Chest x-ray reviewed and interpreted by me with no infiltrate edema, or effusion.  PROCEDURES:  Critical Care performed: No  Procedures   MEDICATIONS ORDERED IN ED: Medications  iohexol (OMNIPAQUE) 350  MG/ML injection 75 mL (75 mLs Intravenous Contrast Given 01/27/23 1320)  alum & mag hydroxide-simeth (MAALOX/MYLANTA) 200-200-20 MG/5ML suspension 30 mL (30 mLs Oral Given 01/27/23 1516)    And  lidocaine (XYLOCAINE) 2 % viscous mouth solution 15 mL (15 mLs Oral Given 01/27/23 1516)     IMPRESSION / MDM / ASSESSMENT AND PLAN / ED COURSE  I reviewed the triage vital signs and the nursing notes.                              62 y.o. male with past medical history of HIV, ESRD on HD (MWF), anemia, and CHF who presents to the ED complaining of 2 weeks of pain and swelling in his legs with dyspnea on exertion.  Patient's presentation is most consistent with acute presentation with potential threat to life or bodily function.  Differential diagnosis includes, but is not limited to, ACS, PE, CHF, COPD, pneumonia, pneumothorax.  Patient nontoxic-appearing and in no acute distress, vital signs remarkable for borderline hypotension but otherwise reassuring.  Patient reports that he deals with chronically low blood pressures and takes midodrine with home dialysis, repeat blood pressure once patient in a room is improved.  EKG shows no evidence arrhythmia or ischemia, troponin mildly elevated but lower compared to previous baseline and I have low suspicion for ACS.  We will recheck troponin, further assess with CTA chest to rule out PE as well as ultrasound of his lower extremities.  No findings concerning for skin or soft tissue infection to his lower extremities.  Additional labs are reassuring, consistent with known ESRD with no acute electrolyte abnormality, LFTs are unremarkable.  Mild leukocytosis noted, anemia improved compared to previous.  CTA chest is negative for PE or other acute process, lower extremity ultrasound also negative for DVT.  Repeat troponin is stable and I doubt ACS, suspect lower extremity swelling and nodularity due to venous insufficiency.  Patient appropriate for outpatient  management, we will have him follow-up with cardiology as well as vascular surgery.  He was counseled to return to the ED for new or worsening symptoms.  Patient agrees with plan.      FINAL CLINICAL IMPRESSION(S) / ED DIAGNOSES   Final diagnoses:  Dyspnea on exertion  Leg swelling     Rx / DC Orders   ED Discharge Orders          Ordered    Ambulatory referral to Cardiology  01/27/23 1540             Note:  This document was prepared using Dragon voice recognition software and may include unintentional dictation errors.   Chesley Noon, MD 01/27/23 [phone]

## 2023-01-29 ENCOUNTER — Emergency Department: Payer: Medicare HMO

## 2023-01-29 ENCOUNTER — Other Ambulatory Visit: Payer: Self-pay

## 2023-01-29 ENCOUNTER — Emergency Department
Admission: EM | Admit: 2023-01-29 | Discharge: 2023-01-29 | Disposition: A | Discharge status: Home / Self Care | Payer: Medicare HMO | Attending: Emergency Medicine | Admitting: Emergency Medicine

## 2023-01-29 DIAGNOSIS — B20 Human immunodeficiency virus [HIV] disease: Secondary | ICD-10-CM | POA: Diagnosis not present

## 2023-01-29 DIAGNOSIS — R066 Hiccough: Secondary | ICD-10-CM | POA: Insufficient documentation

## 2023-01-29 DIAGNOSIS — R110 Nausea: Secondary | ICD-10-CM | POA: Insufficient documentation

## 2023-01-29 DIAGNOSIS — D631 Anemia in chronic kidney disease: Secondary | ICD-10-CM | POA: Insufficient documentation

## 2023-01-29 DIAGNOSIS — N186 End stage renal disease: Secondary | ICD-10-CM | POA: Insufficient documentation

## 2023-01-29 DIAGNOSIS — Z992 Dependence on renal dialysis: Secondary | ICD-10-CM | POA: Insufficient documentation

## 2023-01-29 DIAGNOSIS — I509 Heart failure, unspecified: Secondary | ICD-10-CM | POA: Diagnosis not present

## 2023-01-29 DIAGNOSIS — R1084 Generalized abdominal pain: Secondary | ICD-10-CM | POA: Insufficient documentation

## 2023-01-29 DIAGNOSIS — R0602 Shortness of breath: Secondary | ICD-10-CM | POA: Diagnosis not present

## 2023-01-29 LAB — COMPREHENSIVE METABOLIC PANEL
ALT: 11 U/L (ref 0–44)
AST: 22 U/L (ref 15–41)
Albumin: 3.8 g/dL (ref 3.5–5.0)
Alkaline Phosphatase: 66 U/L (ref 38–126)
Anion gap: 15 (ref 5–15)
BUN: 48 mg/dL — ABNORMAL HIGH (ref 6–20)
CO2: 27 mmol/L (ref 22–32)
Calcium: 8.2 mg/dL — ABNORMAL LOW (ref 8.9–10.3)
Chloride: 88 mmol/L — ABNORMAL LOW (ref 98–111)
Creatinine, Ser: 14.6 mg/dL — ABNORMAL HIGH (ref 0.61–1.24)
GFR, Estimated: 3 mL/min — ABNORMAL LOW (ref >60)
Glucose, Bld: 101 mg/dL — ABNORMAL HIGH (ref 70–99)
Potassium: 4.6 mmol/L (ref 3.5–5.1)
Sodium: 130 mmol/L — ABNORMAL LOW (ref 135–145)
Total Bilirubin: 0.7 mg/dL (ref <1.2)
Total Protein: 8.1 g/dL (ref 6.5–8.1)

## 2023-01-29 LAB — CBC
HCT: 27.5 % — ABNORMAL LOW (ref 39.0–52.0)
Hemoglobin: 9.5 g/dL — ABNORMAL LOW (ref 13.0–17.0)
MCH: 35.6 pg — ABNORMAL HIGH (ref 26.0–34.0)
MCHC: 34.5 g/dL (ref 30.0–36.0)
MCV: 103 fL — ABNORMAL HIGH (ref 80.0–100.0)
Platelets: 187 10*3/uL (ref 150–400)
RBC: 2.67 MIL/uL — ABNORMAL LOW (ref 4.22–5.81)
RDW: 13.5 % (ref 11.5–15.5)
WBC: 10.4 10*3/uL (ref 4.0–10.5)
nRBC: 0 % (ref 0.0–0.2)

## 2023-01-29 LAB — LIPASE, BLOOD: Lipase: 66 U/L — ABNORMAL HIGH (ref 11–51)

## 2023-01-29 LAB — TROPONIN I (HIGH SENSITIVITY): Troponin I (High Sensitivity): 29 ng/L — ABNORMAL HIGH (ref <18)

## 2023-01-29 MED ORDER — LIDOCAINE VISCOUS HCL 2 % MT SOLN
15.0000 mL | Freq: Once | OROMUCOSAL | Status: AC
  Administered 2023-01-29: 15 mL via OROMUCOSAL
  Filled 2023-01-29: qty 15

## 2023-01-29 MED ORDER — PROCHLORPERAZINE EDISYLATE 10 MG/2ML IJ SOLN
5.0000 mg | Freq: Once | INTRAMUSCULAR | Status: AC
  Administered 2023-01-29: 5 mg via INTRAVENOUS
  Filled 2023-01-29: qty 2

## 2023-01-29 MED ORDER — PROCHLORPERAZINE MALEATE 5 MG PO TABS: 5.0000 mg | ORAL_TABLET | Freq: Four times a day (QID) | ORAL | 0 refills | Status: AC | PRN

## 2023-01-29 MED ORDER — PROCHLORPERAZINE MALEATE 5 MG PO TABS
5.0000 mg | ORAL_TABLET | Freq: Once | ORAL | Status: AC
  Administered 2023-01-29: 5 mg via ORAL
  Filled 2023-01-29: qty 1

## 2023-01-29 MED ORDER — IOHEXOL 350 MG/ML SOLN
100.0000 mL | Freq: Once | INTRAVENOUS | Status: AC | PRN
  Administered 2023-01-29: 100 mL via INTRAVENOUS

## 2023-01-29 MED ORDER — DOXYCYCLINE HYCLATE 100 MG TABLET
ORAL_TABLET | Freq: Two times a day (BID) | ORAL | 5 refills | 30.00 days | Status: CP
Start: 2023-01-29 — End: ?

## 2023-01-29 NOTE — ED Notes (Signed)
 Blue top sent down.

## 2023-01-29 NOTE — ED Notes (Signed)
Pt placed on 4 liters NC.

## 2023-01-29 NOTE — ED Provider Notes (Signed)
[facility] Provider Note    Event Date/Time   First MD Initiated Contact with Patient 01/29/23 0102     (approximate)   History   Hiccups and Emesis   HPI IRVING POTEAT is a 62 y.o. male with history of HIV, ESRD on HD (MWF), anemia, and CHF presenting today for hiccups and shortness of breath.  Patient states symptoms have been going on for the past 3 days.  Shortness of breath worsened today.  Also having nausea and generalized abdominal pain.  Was seen in the ED 2 days ago for similar symptoms.  States they are getting worse at this time.  Otherwise denies fever, cough, chest pain, diarrhea, constipation.  Reviewed chart notes from 01/27/2023 for ED visit.  At that time had CTA chest that was negative for PE.  Duplex ultrasound of lower extremities negative for DVT.  Laboratory workup otherwise reassuring at that time.     Physical Exam   Triage Vital Signs: ED Triage Vitals [01/29/23 0043]  Encounter Vitals Group     BP      Systolic BP Percentile      Diastolic BP Percentile      Pulse      Resp      Temp      Temp src      SpO2      Weight      Height      Head Circumference      Peak Flow      Pain Score 0     Pain Loc      Pain Education      Exclude from Growth Chart     Most recent vital signs: Vitals:   01/29/23 0122 01/29/23 0123  BP: 106/68   Pulse: 83 82  Resp:  (!) 23  Temp: 98 F (36.7 C)   SpO2: 99% 99%    Physical Exam: I have reviewed the vital signs and nursing notes. General: Awake, alert, no acute distress.  Nontoxic appearing.  Hiccuping on exam. Head:  Atraumatic, normocephalic.   ENT:  EOM intact, PERRL. Oral mucosa is pink and moist with no lesions. Neck: Neck is supple with full range of motion, No meningeal signs. Cardiovascular:  RRR, No murmurs. Peripheral pulses palpable and equal bilaterally. Respiratory:  Symmetrical chest wall expansion.  No rhonchi, rales, or wheezes.  Good air movement  throughout.  No use of accessory muscles.   Musculoskeletal:  No cyanosis or edema. Moving extremities with full ROM Abdomen:  Soft, nontender, nondistended. Neuro:  GCS 15, moving all four extremities, interacting appropriately. Speech clear. Psych:  Calm, appropriate.   Skin:  Warm, dry, no rash.    ED Results / Procedures / Treatments   Labs (all labs ordered are listed, but only abnormal results are displayed) Labs Reviewed  LIPASE, BLOOD - Abnormal; Notable for the following components:      Result Value   Lipase 66 (*)    All other components within normal limits  COMPREHENSIVE METABOLIC PANEL - Abnormal; Notable for the following components:   Sodium 130 (*)    Chloride 88 (*)    Glucose, Bld 101 (*)    BUN 48 (*)    Creatinine, Ser 14.60 (*)    Calcium 8.2 (*)    GFR, Estimated 3 (*)    All other components within normal limits  CBC - Abnormal; Notable for the following components:   RBC 2.67 (*)  Hemoglobin 9.5 (*)    HCT 27.5 (*)    MCV 103.0 (*)    MCH 35.6 (*)    All other components within normal limits  TROPONIN I (HIGH SENSITIVITY) - Abnormal; Notable for the following components:   Troponin I (High Sensitivity) 29 (*)    All other components within normal limits  URINALYSIS, ROUTINE W REFLEX MICROSCOPIC     EKG My EKG interpretation: Rate of 83, normal sinus rhythm, normal axis.  QTc slightly prolonged at 502.  No other acute ST elevation or depression   RADIOLOGY Independently interpreted CTA chest/abdomen/pelvis with no acute pathology   PROCEDURES:  Critical Care performed: No  Procedures   MEDICATIONS ORDERED IN ED: Medications  prochlorperazine (COMPAZINE) tablet 5 mg (has no administration in time range)  iohexol (OMNIPAQUE) 350 MG/ML injection 100 mL (100 mLs Intravenous Contrast Given 01/29/23 0145)  prochlorperazine (COMPAZINE) injection 5 mg (5 mg Intravenous Given 01/29/23 0311)  lidocaine (XYLOCAINE) 2 % viscous mouth solution  15 mL (15 mLs Mouth/Throat Given 01/29/23 0312)     IMPRESSION / MDM / ASSESSMENT AND PLAN / ED COURSE  I reviewed the triage vital signs and the nursing notes.                              Differential diagnosis includes, but is not limited to, pneumonia, dissection, aneurysm, diaphragm irritation, vagus nerve injury, intractable hiccups  Patient's presentation is most consistent with acute complicated illness / injury requiring diagnostic workup.  Patient is a 62 year old male presenting today for hiccups and emesis.  They have been going on for several days and was seen in the ED 2 days ago for similar symptoms.  Initial vital signs in triage noted patient to be hypotensive and hypoxic at 70% on room air was immediately placed on nonrebreather and brought back into room.  He did not appear in any respiratory distress and lung auscultation was unremarkable.  Repeat blood pressure at the bedside was back within normal limits.  Also, when getting repeat oxygen saturation with good waveform he was able to be completely titrated off the nonrebreather which suggest more likely erroneous finding.  Given his initial findings with hiccuping, was concern for possible diaphragmatic irritation possibly from blood which could be from a dissection or aneurysm.  Patient was immediately taken for CTA chest/abdomen/pelvis.  This showed no acute pathology to explain patient's hiccups and no other concerning findings at this time.  Laboratory workup otherwise consistent with patient's baseline.  Vital signs otherwise stable and not hypoxic on room air with no tachypnea.  Symptomatically treated patient's hiccuping with Compazine and viscous lidocaine.  This resulted in complete resolution of his symptoms.  He denied any other acute findings at this time.  I do think patient is safe for discharge with improvement in his intractable hiccups and no other acute pathology found.  Did recommend he call his nephrology team  tomorrow to discuss when he should get his next dialysis appointment given contrast loading today.  Otherwise given strict return precautions and told to follow-up with PCP.  The patient is on the cardiac monitor to evaluate for evidence of arrhythmia and/or significant heart rate changes. Clinical Course as of 01/29/23 0402  Mon Jan 29, 2023  0230 Patient titrated completely off all oxygen.  Suspect initial readings in triage were erroneous likely due to cold fingers. [DW]  0236 CT Angio Chest/Abd/Pel for Dissection W and/or Wo Contrast  No acute findings to explain hiccups today. [DW]  0356 Reassessed and feeling significantly better at this time.  No ongoing hiccups.  Patient does wish to be discharged at this time which I feel is safe given reassuring laboratory workup and imaging [DW]    Clinical Course User Index [DW] Janith Lima, MD     FINAL CLINICAL IMPRESSION(S) / ED DIAGNOSES   Final diagnoses:  Intractable hiccups     Rx / DC Orders   ED Discharge Orders          Ordered    prochlorperazine (COMPAZINE) 5 MG tablet  Every 6 hours PRN        01/29/23 0356             Note:  This document was prepared using Dragon voice recognition software and may include unintentional dictation errors.   Janith Lima, MD 01/29/23 [phone]

## 2023-01-29 NOTE — ED Triage Notes (Addendum)
Pt yo ED via [facility] c/o hiccups and SOB since last Wednesday. Pt also reports some vomiting today. SHOB has been getting progressively worse. Was seen here 2 days ago for [facility]  128CBG 99/70 102HR 93% RA

## 2023-01-29 NOTE — Discharge Instructions (Signed)
You can pick up the medication I have prescribed at your pharmacy and take every 6 hours as needed.  Please follow-up with your primary care provider for ongoing assessment and management outpatient.  The rest of your labs and imaging were reassuring today.  Please also call your kidney team tomorrow to discuss whether you need dialysis sooner than Wednesday given that you got a CT with contrast today.

## 2023-01-30 ENCOUNTER — Ambulatory Visit
Admit: 2023-01-30 | Discharge: 2023-02-02 | Disposition: A | Discharge status: Home / Self Care | Payer: PRIVATE HEALTH INSURANCE

## 2023-01-30 ENCOUNTER — Ambulatory Visit: Admit: 2023-01-30 | Discharge: 2023-02-02 | Payer: PRIVATE HEALTH INSURANCE

## 2023-01-30 DIAGNOSIS — R066 Hiccough: Principal | ICD-10-CM

## 2023-01-30 MED ORDER — GABAPENTIN 100 MG CAPSULE
ORAL_CAPSULE | Freq: Two times a day (BID) | ORAL | 3 refills | 90.00 days | Status: SS
Start: 2023-01-30 — End: 2024-01-30

## 2023-01-31 ENCOUNTER — Encounter: Payer: Self-pay | Admitting: *Deleted

## 2023-01-31 DIAGNOSIS — I5022 Chronic systolic (congestive) heart failure: Secondary | ICD-10-CM

## 2023-01-31 NOTE — Progress Notes (Signed)
Cardiac Individual Treatment Plan  Patient Details  Name: Louis [name] MRN: [id number] Date of Birth: 1960/09/09 Referring Provider:   Flowsheet Row Pulmonary Rehab from 10/31/2022 in [facility] Cardiac and Pulmonary Rehab  Referring Provider Dr. Delton Prairie       Initial Encounter Date:  Flowsheet Row Pulmonary Rehab from 10/31/2022 in [facility] Cardiac and Pulmonary Rehab  Date 10/31/22       Visit Diagnosis: Heart failure, chronic systolic (HCC)  Patient's Home Medications on Admission:  Current Outpatient Medications:    AURYXIA 1 GM 210 MG(Fe) tablet, Take 1 tablet by mouth 4 (four) times daily -  before meals and at bedtime. (Patient not taking: Reported on 10/12/2022), Disp: , Rfl:    B Complex-C-Folic Acid (RENA-VITE RX) 1 MG TABS, Take 1 tablet by mouth daily. (Patient not taking: Reported on 04/25/2022), Disp: , Rfl:    BIKTARVY 50-200-25 MG TABS tablet, Take 1 tablet by mouth daily., Disp: , Rfl:    calcium carbonate (TUMS) 500 MG chewable tablet, Chew by mouth. (Patient not taking: Reported on 10/12/2022), Disp: , Rfl:    carvedilol (COREG) 12.5 MG tablet, Take by mouth., Disp: , Rfl:    clindamycin (CLEOCIN T) 1 % SWAB, Apply topically., Disp: , Rfl:    clobetasol ointment (TEMOVATE) 0.05 %, Apply topically 2 (two) times daily as needed. (Patient not taking: Reported on 10/12/2022), Disp: , Rfl:    DESCOVY 200-25 MG tablet, Take 1 tablet by mouth daily., Disp: , Rfl:    diclofenac Sodium (VOLTAREN) 1 % GEL, Apply 4 g topically 4 (four) times daily. (Patient not taking: Reported on 04/25/2022), Disp: 100 g, Rfl: 1   methocarbamol (ROBAXIN) 500 MG tablet, TAKE 1 TABLET (500 MG TOTAL) BY MOUTH 2 (TWO) TIMES DAILY AS NEEDED. FOR PAIN, Disp: 60 tablet, Rfl: 0   minoxidil (LONITEN) 2.5 MG tablet, Take 2.5 mg by mouth daily. (Patient not taking: Reported on 04/25/2022), Disp: , Rfl:    ondansetron (ZOFRAN-ODT) 4 MG disintegrating tablet, Take 4 mg by mouth every 8 (eight) hours as needed.  (Patient not taking: Reported on 10/12/2022), Disp: , Rfl:    predniSONE (DELTASONE) 10 MG tablet, Take 1 tablet (10 mg total) by mouth 2 (two) times daily as needed. Prn gout attack for 2 days, Disp: 10 tablet, Rfl: 0   prochlorperazine (COMPAZINE) 5 MG tablet, Take 1 tablet (5 mg total) by mouth every 6 (six) hours as needed for nausea or vomiting (hiccups)., Disp: 15 tablet, Rfl: 0   Skin Protectants, Misc. (AQUAGARD HYDRATING) 41 % OINT, Apply topically 2 (two) times daily., Disp: , Rfl:    TIVICAY 50 MG tablet, Take 1 tablet by mouth daily. (Patient not taking: Reported on 04/25/2022), Disp: , Rfl:    valACYclovir (VALTREX) 500 MG tablet, Take 500 mg by mouth daily as needed (for flare ups). , Disp: , Rfl:    valACYclovir (VALTREX) 500 MG tablet, Take by mouth. (Patient not taking: Reported on 10/12/2022), Disp: , Rfl:   Past Medical History: Past Medical History:  Diagnosis Date   Chronic kidney disease    esrd   Dermatophytosis of foot 06/23/2004   Environmental allergies    ESRD (end stage renal disease) on dialysis (HCC)    Gout    HIV infection (HCC) 12/1995   Hypertension    Seborrhea 06/23/2004    Tobacco Use: Social History   Tobacco Use  Smoking Status Never  Smokeless Tobacco Never    Labs: Review Flowsheet  Latest Ref Rng & Units 06/09/2010 08/09/2011 08/23/2016  Labs for ITP Cardiac and Pulmonary Rehab  Cholestrol 0 - 200 mg/dL 409  811  -  LDL (calc) 0 - 99 mg/dL 914  782  -  HDL-C >95 mg/dL 55  54  -  Trlycerides <150 mg/dL 69  89  -  Hemoglobin A2Z 4.8 - 5.6 % - - 5.1      Exercise Target Goals: Exercise Program Goal: Individual exercise prescription set using results from initial 6 min walk test and THRR while considering  patient's activity barriers and safety.   Exercise Prescription Goal: Initial exercise prescription builds to 30-45 minutes a day of aerobic activity, 2-3 days per week.  Home exercise guidelines will be given to patient during  program as part of exercise prescription that the participant will acknowledge.   Education: Aerobic Exercise: - Group verbal and visual presentation on the components of exercise prescription. Introduces F.I.T.T principle from ACSM for exercise prescriptions.  Reviews F.I.T.T. principles of aerobic exercise including progression. Written material given at graduation.   Education: Resistance Exercise: - Group verbal and visual presentation on the components of exercise prescription. Introduces F.I.T.T principle from ACSM for exercise prescriptions  Reviews F.I.T.T. principles of resistance exercise including progression. Written material given at graduation.    Education: Exercise & Equipment Safety: - Individual verbal instruction and demonstration of equipment use and safety with use of the equipment. Flowsheet Row Pulmonary Rehab from 10/31/2022 in [facility] Cardiac and Pulmonary Rehab  Date 10/31/22  Educator MB  Instruction Review Code 1- Verbalizes Understanding       Education: Exercise Physiology & General Exercise Guidelines: - Group verbal and written instruction with models to review the exercise physiology of the cardiovascular system and associated critical values. Provides general exercise guidelines with specific guidelines to those with heart or lung disease.    Education: Flexibility, Balance, Mind/Body Relaxation: - Group verbal and visual presentation with interactive activity on the components of exercise prescription. Introduces F.I.T.T principle from ACSM for exercise prescriptions. Reviews F.I.T.T. principles of flexibility and balance exercise training including progression. Also discusses the mind body connection.  Reviews various relaxation techniques to help reduce and manage stress (i.e. Deep breathing, progressive muscle relaxation, and visualization). Balance handout provided to take home. Written material given at graduation.   Activity Barriers & Risk  Stratification:  Activity Barriers & Cardiac Risk Stratification - 10/31/22 1416       Activity Barriers & Cardiac Risk Stratification   Activity Barriers None             6 Minute Walk:  6 Minute Walk     Row Name 10/31/22 1413         6 Minute Walk   Phase Initial     Distance 1380 feet     Walk Time 6 minutes     # of Rest Breaks 0     MPH 2.6     METS 4.34     RPE 11     Perceived Dyspnea  0     VO2 Peak 15.19     Symptoms Yes (comment)     Comments Blood Pressure was high today, no symptoms from it, but waited for it to go down prior to starting     Resting HR 57 bpm     Resting BP 160/100     Resting Oxygen Saturation  94 %     Exercise Oxygen Saturation  during 6 min walk 77 %  Max Ex. HR 86 bpm     Max Ex. BP 218/110     2 Minute Post BP 190/110       Interval HR   1 Minute HR 65     2 Minute HR 76     3 Minute HR 81     4 Minute HR 81     5 Minute HR 82     6 Minute HR 86     Interval Heart Rate? Yes       Interval Oxygen   Interval Oxygen? Yes     Baseline Oxygen Saturation % 94 %     1 Minute Oxygen Saturation % 90 %     1 Minute Liters of Oxygen 0 L     2 Minute Oxygen Saturation % 85 %     2 Minute Liters of Oxygen 0 L     3 Minute Oxygen Saturation % 83 %     3 Minute Liters of Oxygen 0 L     4 Minute Oxygen Saturation % 81 %     4 Minute Liters of Oxygen 0 L     5 Minute Oxygen Saturation % 77 %     5 Minute Liters of Oxygen 0 L     6 Minute Oxygen Saturation % 84 %     6 Minute Liters of Oxygen 0 L     2 Minute Post Oxygen Saturation % 94 %     2 Minute Post Liters of Oxygen 0 L              Oxygen Initial Assessment:  Oxygen Initial Assessment - 10/12/22 0913       Home Oxygen   Home Oxygen Device None    Sleep Oxygen Prescription None    Home Exercise Oxygen Prescription None    Home Resting Oxygen Prescription None      Initial 6 min Walk   Oxygen Used None      Program Oxygen Prescription   Program  Oxygen Prescription None      Intervention   Short Term Goals To learn and understand importance of maintaining oxygen saturations>88%;To learn and demonstrate proper pursed lip breathing techniques or other breathing techniques. ;To learn and understand importance of monitoring SPO2 with pulse oximeter and demonstrate accurate use of the pulse oximeter.;To learn and exhibit compliance with exercise, home and travel O2 prescription    Long  Term Goals Exhibits compliance with exercise, home  and travel O2 prescription;Verbalizes importance of monitoring SPO2 with pulse oximeter and return demonstration;Maintenance of O2 saturations>88%;Exhibits proper breathing techniques, such as pursed lip breathing or other method taught during program session;Compliance with respiratory medication             Oxygen Re-Evaluation:  Oxygen Re-Evaluation     Row Name 11/02/22 1135 12/14/22 1118           Program Oxygen Prescription   Program Oxygen Prescription -- None        Home Oxygen   Home Oxygen Device -- None      Sleep Oxygen Prescription -- None      Home Exercise Oxygen Prescription -- None      Home Resting Oxygen Prescription -- None        Goals/Expected Outcomes   Short Term Goals -- To learn and demonstrate proper pursed lip breathing techniques or other breathing techniques.       Long  Term Goals -- Exhibits proper breathing techniques,  such as pursed lip breathing or other method taught during program session      Comments Reviewed PLB technique with pt.  Talked about how it works and it's importance in maintaining their exercise saturations. Informed patient how to perform the Pursed Lipped breathing technique. Told patient to Inhale through the nose and out the mouth with pursed lips to keep their airways open, help oxygenate them better, practice when at rest or doing strenuous activity. Patient Verbalizes understanding of technique and will work on and be reiterated during  [facility].      Goals/Expected Outcomes Short: Become more profiecient at using PLB. Long: Become independent at using PLB. Short: use PLB with exertion. Long: use PLB on exertion proficiently and independently.               Oxygen Discharge (Final Oxygen Re-Evaluation):  Oxygen Re-Evaluation - 12/14/22 1118       Program Oxygen Prescription   Program Oxygen Prescription None      Home Oxygen   Home Oxygen Device None    Sleep Oxygen Prescription None    Home Exercise Oxygen Prescription None    Home Resting Oxygen Prescription None      Goals/Expected Outcomes   Short Term Goals To learn and demonstrate proper pursed lip breathing techniques or other breathing techniques.     Long  Term Goals Exhibits proper breathing techniques, such as pursed lip breathing or other method taught during program session    Comments Informed patient how to perform the Pursed Lipped breathing technique. Told patient to Inhale through the nose and out the mouth with pursed lips to keep their airways open, help oxygenate them better, practice when at rest or doing strenuous activity. Patient Verbalizes understanding of technique and will work on and be reiterated during [facility].    Goals/Expected Outcomes Short: use PLB with exertion. Long: use PLB on exertion proficiently and independently.             Initial Exercise Prescription:  Initial Exercise Prescription - 10/31/22 1400       Date of Initial Exercise RX and Referring Provider   Date 10/31/22    Referring Provider Dr. Delton Prairie      Oxygen   Maintain Oxygen Saturation 88% or higher      Treadmill   MPH 2.6    Grade 0    Minutes 15    METs 2.99      Recumbant Bike   Level 3    RPM 50    Watts 50    Minutes 15    METs 4.34      NuStep   Level 3    SPM 80    Minutes 15    METs 4.34      T5 Nustep   Level 3    SPM 80    Minutes 15    METs 4.34      Prescription Details   Frequency (times per week) 2     Duration Progress to 30 minutes of continuous aerobic without signs/symptoms of physical distress      Intensity   THRR 40-80% of Max Heartrate 98-139    Ratings of Perceived Exertion 11-13    Perceived Dyspnea 0-4      Progression   Progression Continue to progress workloads to maintain intensity without signs/symptoms of physical distress.      Resistance Training   Training Prescription Yes    Weight 5 lb    Reps 10-15  Perform Capillary Blood Glucose checks as needed.  Exercise Prescription Changes:   Exercise Prescription Changes     Row Name 10/31/22 1400 11/17/22 1000 12/01/22 1000 12/14/22 1500 01/25/23 1600     Response to Exercise   Blood Pressure (Admit) 160/100 180/90 156/88 142/62 132/72   Blood Pressure (Exercise) 218/110 190/90 158/86 160/80 --   Blood Pressure (Exit) 180/100 132/62 154/74 128/64 170/70   Heart Rate (Admit) 57 bpm 66 bpm 69 bpm 82 bpm 88 bpm   Heart Rate (Exercise) 86 bpm 99 bpm 86 bpm 100 bpm 104 bpm   Heart Rate (Exit) 59 bpm 72 bpm 76 bpm 85 bpm 92 bpm   Oxygen Saturation (Admit) 94 % 95 % 96 % 92 % 97 %   Oxygen Saturation (Exercise) 77 % 90 % 94 % 88 % 95 %   Oxygen Saturation (Exit) 97 % 93 % 96 % 92 % 98 %   Rating of Perceived Exertion (Exercise) 11 13 12 14 12    Perceived Dyspnea (Exercise) 0 0 2 0 0   Symptoms BP was high today, no symptoms from it, but waited for it to go down prior to -- none none none   Comments results First full week of exercise -- -- --   Duration Progress to 30 minutes of  aerobic without signs/symptoms of physical distress Continue with 30 min of aerobic exercise without signs/symptoms of physical distress. Continue with 30 min of aerobic exercise without signs/symptoms of physical distress. Continue with 30 min of aerobic exercise without signs/symptoms of physical distress. Continue with 30 min of aerobic exercise without signs/symptoms of physical distress.   Intensity THRR New THRR  New THRR New THRR New THRR unchanged     Progression   Progression Continue to progress workloads to maintain intensity without signs/symptoms of physical distress. Continue to progress workloads to maintain intensity without signs/symptoms of physical distress. Continue to progress workloads to maintain intensity without signs/symptoms of physical distress. Continue to progress workloads to maintain intensity without signs/symptoms of physical distress. Continue to progress workloads to maintain intensity without signs/symptoms of physical distress.   Average METs 4.34 4.34 2.71 2.93 2.9     Resistance Training   Training Prescription -- Yes Yes Yes Yes   Weight -- 5 lb 5 lb 5 lb 5 lb   Reps -- 10-15 10-15 10-15 10-15     Interval Training   Interval Training -- No No No No     Treadmill   MPH -- 2.6 -- 2 --   Grade -- 0 -- 0 --   Minutes -- 15 -- 15 --   METs -- 2.99 -- 2.53 --     Recumbant Bike   Level -- 3 3 1  --   Watts -- 22 22 20  --   Minutes -- 15 15 15  --   METs -- 2.82 2.81 2.74 --     NuStep   Level -- 3 -- 4 1   Minutes -- 15 -- 15 15   METs -- 2.4 -- 3.9 2.9     REL-XR   Level -- -- 2 3 --   Minutes -- -- 30 15 --   METs -- -- 3.5 -- --     T5 Nustep   Level -- 3 3 -- --   Minutes -- 15 15 -- --   METs -- 2.2 2.3 -- --     Oxygen   Maintain Oxygen Saturation -- 88% or higher  88% or higher 88% or higher 88% or higher            Exercise Comments:   Exercise Comments     Row Name 11/02/22 1134           Exercise Comments First full day of exercise!  Patient was oriented to gym and equipment including functions, settings, policies, and procedures.  Patient's individual exercise prescription and treatment plan were reviewed.  All starting workloads were established based on the results of the 6 minute walk test done at initial orientation visit.  The plan for exercise progression was also introduced and progression will be customized based on  patient's performance and goals.                Exercise Goals and Review:   Exercise Goals     Row Name 10/31/22 1426             Exercise Goals   Increase Physical Activity Yes       Intervention Provide advice, education, support and counseling about physical activity/exercise needs.;Develop an individualized exercise prescription for aerobic and resistive training based on initial evaluation findings, risk stratification, comorbidities and participant's personal goals.       Expected Outcomes Short Term: Attend rehab on a regular basis to increase amount of physical activity.;Long Term: Add in home exercise to make exercise part of routine and to increase amount of physical activity.;Long Term: Exercising regularly at least 3-5 days a week.       Increase Strength and Stamina Yes       Intervention Provide advice, education, support and counseling about physical activity/exercise needs.;Develop an individualized exercise prescription for aerobic and resistive training based on initial evaluation findings, risk stratification, comorbidities and participant's personal goals.       Expected Outcomes Short Term: Increase workloads from initial exercise prescription for resistance, speed, and METs.;Short Term: Perform resistance training exercises routinely during rehab and add in resistance training at home;Long Term: Improve cardiorespiratory fitness, muscular endurance and strength as measured by increased METs and functional capacity ( )       Able to understand and use rate of perceived exertion (RPE) scale Yes       Intervention Provide education and explanation on how to use RPE scale       Expected Outcomes Short Term: Able to use RPE daily in rehab to express subjective intensity level;Long Term:  Able to use RPE to guide intensity level when exercising independently       Able to understand and use Dyspnea scale Yes       Intervention Provide education and explanation on how  to use Dyspnea scale       Expected Outcomes Short Term: Able to use Dyspnea scale daily in rehab to express subjective sense of shortness of breath during exertion;Long Term: Able to use Dyspnea scale to guide intensity level when exercising independently       Knowledge and understanding of Target Heart Rate Range (THRR) Yes       Intervention Provide education and explanation of THRR including how the numbers were predicted and where they are located for reference       Expected Outcomes Short Term: Able to state/look up THRR;Short Term: Able to use daily as guideline for intensity in rehab;Long Term: Able to use THRR to govern intensity when exercising independently       Able to check pulse independently Yes       Intervention Provide education  and demonstration on how to check pulse in carotid and radial arteries.;Review the importance of being able to check your own pulse for safety during independent exercise       Expected Outcomes Short Term: Able to explain why pulse checking is important during independent exercise;Long Term: Able to check pulse independently and accurately       Understanding of Exercise Prescription Yes       Intervention Provide education, explanation, and written materials on patient's individual exercise prescription       Expected Outcomes Short Term: Able to explain program exercise prescription;Long Term: Able to explain home exercise prescription to exercise independently                Exercise Goals Re-Evaluation :  Exercise Goals Re-Evaluation     Row Name 11/02/22 1134 11/17/22 1036 12/01/22 1037 12/14/22 1559 01/10/23 1103     Exercise Goal Re-Evaluation   Exercise Goals Review Able to understand and use rate of perceived exertion (RPE) scale;Able to understand and use Dyspnea scale;Knowledge and understanding of Target Heart Rate Range (THRR);Understanding of Exercise Prescription Understanding of Exercise Prescription;Increase Physical  Activity;Increase Strength and Stamina Understanding of Exercise Prescription;Increase Physical Activity;Increase Strength and Stamina Understanding of Exercise Prescription;Increase Physical Activity;Increase Strength and Stamina Understanding of Exercise Prescription;Increase Physical Activity;Increase Strength and Stamina   Comments Reviewed RPE  and dyspnea scale, THR and program prescription with pt today.  Pt voiced understanding and was given a copy of goals to take home. Tari is off to a good start in the program. He is doing well with the treadmill at a speed of 2.6 mph with no incline. He also has done well with the recumbent bike, T4 nustep, and T5 nustep at level 3. We will continue to monitor his progress in the program. Amaurion is doing well in the program. He continues to work at level 3 on the recumbent bike and T5 nustep. He also began using the XR at level 2 and did wellwith it. He has not done any walking in rehab at this time. We will continue to monitor his progress in the program. Nakeem continues to do well in rehab. He increased to level 4 on the T4 nustep and level 3 on the XR. He also decreased his speed on the treadmill from 2.6 mph down to 2 mph with no incline. We will continue to monitor his progress in the program. Khyon has not attended rehab since 10/31. We will continue to reach out to determine his status. We will continue to monitor his progress in the program upon return.   Expected Outcomes Short: Use RPE daily to regulate intensity. Long: Follow program prescription in THR. Short: Continue to follow current exercise prescription. Long: Continue exercise to improve strength and stamina. Short: Begin walking treadmill or track in rehab. Long: Continue exercise to improve strength and stamina. Short: Increase treadmill workload back up to previous speed. Long: Continue exercise to improve strength and stamina. Short: attend the program. Long: Continue exercise to improve  strength and stamina.    Row Name 01/25/23 1615             Exercise Goal Re-Evaluation   Exercise Goals Review Understanding of Exercise Prescription;Increase Physical Activity;Increase Strength and Stamina       Comments Thoren has only attended rehab once since the last review. During this one session he was able to work at level 1 on the T4 nustep for 30 minutes, which was a decreased workload for  Ethelene Browns. We will remind him of the importance of good attendance in order to see improvements with his aerobic exercise. We will continue to monitor his progress in the program.       Expected Outcomes Short: Attend rehab more consistently. Long: Continue exercise to improve strength and stamina.                Discharge Exercise Prescription (Final Exercise Prescription Changes):  Exercise Prescription Changes - 01/25/23 1600       Response to Exercise   Blood Pressure (Admit) 132/72    Blood Pressure (Exit) 170/70    Heart Rate (Admit) 88 bpm    Heart Rate (Exercise) 104 bpm    Heart Rate (Exit) 92 bpm    Oxygen Saturation (Admit) 97 %    Oxygen Saturation (Exercise) 95 %    Oxygen Saturation (Exit) 98 %    Rating of Perceived Exertion (Exercise) 12    Perceived Dyspnea (Exercise) 0    Symptoms none    Duration Continue with 30 min of aerobic exercise without signs/symptoms of physical distress.    Intensity THRR unchanged      Progression   Progression Continue to progress workloads to maintain intensity without signs/symptoms of physical distress.    Average METs 2.9      Resistance Training   Training Prescription Yes    Weight 5 lb    Reps 10-15      Interval Training   Interval Training No      NuStep   Level 1    Minutes 15    METs 2.9      Oxygen   Maintain Oxygen Saturation 88% or higher             Nutrition:  Target Goals: Understanding of nutrition guidelines, daily intake of sodium 1500mg , cholesterol 200mg , calories 30% from fat and 7%  or less from saturated fats, daily to have 5 or more servings of fruits and vegetables.  Education: All About Nutrition: -Group instruction provided by verbal, written material, interactive activities, discussions, models, and posters to present general guidelines for heart healthy nutrition including fat, fiber, MyPlate, the role of sodium in heart healthy nutrition, utilization of the nutrition label, and utilization of this knowledge for meal planning. Follow up email sent as well. Written material given at graduation.   Biometrics:  Pre Biometrics - 10/31/22 1427       Pre Biometrics   Height 6' 1.5" (1.867 m)    Weight 185 lb 1.6 oz (84 kg)    Waist Circumference 37 inches    Hip Circumference 42 inches    Waist to Hip Ratio 0.88 %    BMI (Calculated) 24.09    Single Leg Stand 30 seconds              Nutrition Therapy Plan and Nutrition Goals:  Nutrition Therapy & Goals - 10/31/22 1428       Nutrition Therapy   RD appointment deferred Yes      Personal Nutrition Goals   Nutrition Goal RD appointment deferred, already met with one with dialysis      Intervention Plan   Intervention Prescribe, educate and counsel regarding individualized specific dietary modifications aiming towards targeted core components such as weight, hypertension, lipid management, diabetes, heart failure and other comorbidities.;Nutrition handout(s) given to patient.    Expected Outcomes Short Term Goal: Understand basic principles of dietary content, such as calories, fat, sodium, cholesterol and nutrients.;Long Term Goal: Adherence to  prescribed nutrition plan.;Short Term Goal: A plan has been developed with personal nutrition goals set during dietitian appointment.             Nutrition Assessments:  MEDIFICTS Score Key: >=70 Need to make dietary changes  40-70 Heart Healthy Diet <= 40 Therapeutic Level Cholesterol Diet   Picture Your Plate Scores: <21 Unhealthy dietary pattern with  much room for improvement. 41-50 Dietary pattern unlikely to meet recommendations for good health and room for improvement. 51-60 More healthful dietary pattern, with some room for improvement.  >60 Healthy dietary pattern, although there may be some specific behaviors that could be improved.    Nutrition Goals Re-Evaluation:  Nutrition Goals Re-Evaluation     Row Name 12/14/22 1117             Goals   Comment Patient was informed on why it is important to maintain a balanced diet when dealing with Respiratory issues. Explained that it takes a lot of energy to breath and when they are short of breath often they will need to have a good diet to help keep up with the calories they are expending for breathing.       Expected Outcome Short: Choose and plan snacks accordingly to patients caloric intake to improve breathing. Long: Maintain a diet independently that meets their caloric intake to aid in daily shortness of breath.                Nutrition Goals Discharge (Final Nutrition Goals Re-Evaluation):  Nutrition Goals Re-Evaluation - 12/14/22 1117       Goals   Comment Patient was informed on why it is important to maintain a balanced diet when dealing with Respiratory issues. Explained that it takes a lot of energy to breath and when they are short of breath often they will need to have a good diet to help keep up with the calories they are expending for breathing.    Expected Outcome Short: Choose and plan snacks accordingly to patients caloric intake to improve breathing. Long: Maintain a diet independently that meets their caloric intake to aid in daily shortness of breath.             Psychosocial: Target Goals: Acknowledge presence or absence of significant depression and/or stress, maximize coping skills, provide positive support system. Participant is able to verbalize types and ability to use techniques and skills needed for reducing stress and depression.    Education: Stress, Anxiety, and Depression - Group verbal and visual presentation to define topics covered.  Reviews how body is impacted by stress, anxiety, and depression.  Also discusses healthy ways to reduce stress and to treat/manage anxiety and depression.  Written material given at graduation.   Education: Sleep Hygiene -Provides group verbal and written instruction about how sleep can affect your health.  Define sleep hygiene, discuss sleep cycles and impact of sleep habits. Review good sleep hygiene tips.    Initial Review & Psychosocial Screening:  Initial Psych Review & Screening - 10/12/22 0914       Initial Review   Current issues with None Identified      Family Dynamics   Good Support System? Yes    Comments Patient reports no issues with their current mental states, sleep, stress, depression or anxiety. Will follow up with patient in a few weeks for any changes.      Barriers   Psychosocial barriers to participate in program There are no identifiable barriers or psychosocial needs.  Screening Interventions   Interventions Encouraged to exercise;To provide support and resources with identified psychosocial needs;Provide feedback about the scores to participant    Expected Outcomes Short Term goal: Utilizing psychosocial counselor, staff and physician to assist with identification of specific Stressors or current issues interfering with healing process. Setting desired goal for each stressor or current issue identified.;Long Term Goal: Stressors or current issues are controlled or eliminated.;Short Term goal: Identification and review with participant of any Quality of Life or Depression concerns found by scoring the questionnaire.;Long Term goal: The participant improves quality of Life and PHQ9 Scores as seen by post scores and/or verbalization of changes             Quality of Life Scores:   Scores of 19 and below usually indicate a poorer quality of life  in these areas.  A difference of  2-3 points is a clinically meaningful difference.  A difference of 2-3 points in the total score of the Quality of Life Index has been associated with significant improvement in overall quality of life, self-image, physical symptoms, and general health in studies assessing change in quality of life.  PHQ-9: Review Flowsheet  More data exists      12/14/2022 10/31/2022 04/25/2022 10/05/2020 09/16/2020  Depression screen PHQ 2/9  Decreased Interest 0 2 0 0 0  Down, Depressed, Hopeless 0 0 0 0 0  PHQ - 2 Score 0 2 0 0 0  Altered sleeping 0 1 0 - -  Tired, decreased energy 2 2 1  - -  Change in appetite 0 2 0 - -  Feeling bad or failure about yourself  0 0 0 - -  Trouble concentrating 0 0 0 - -  Moving slowly or fidgety/restless 0 0 1 - -  Suicidal thoughts 0 0 0 - -  PHQ-9 Score 2 7 2  - -  Difficult doing work/chores Not difficult at all - - - -   Interpretation of Total Score  Total Score Depression Severity:  1-4 = Minimal depression, 5-9 = Mild depression, 10-14 = Moderate depression, 15-19 = Moderately severe depression, 20-27 = Severe depression   Psychosocial Evaluation and Intervention:  Psychosocial Evaluation - 10/12/22 0915       Psychosocial Evaluation & Interventions   Interventions Encouraged to exercise with the program and follow exercise prescription;Relaxation education;Stress management education    Comments Patient reports no issues with their current mental states, sleep, stress, depression or anxiety. Will follow up with patient in a few weeks for any changes.    Expected Outcomes Short: Exercise regularly to support mental health and notify staff of any changes. Long: maintain mental health and well being through teaching of rehab or prescribed medications independently.    Continue Psychosocial Services  Follow up required by staff             Psychosocial Re-Evaluation:  Psychosocial Re-Evaluation     Row Name 12/14/22 1122              Psychosocial Re-Evaluation   Current issues with None Identified       Comments Reviewed patient health questionnaire (PHQ-9) with patient for follow up. Previously, patients score indicated signs/symptoms of depression.  Reviewed to see if patient is improving symptom wise while in program.  Score improved and patient states that it is because he has been exercising more.       Expected Outcomes Short: Continue to attend [facility] regularly for regular exercise and social engagement. Long: Continue to  improve symptoms and manage a positive mental state.       Interventions Encouraged to attend Pulmonary Rehabilitation for the exercise       Continue Psychosocial Services  Follow up required by staff                Psychosocial Discharge (Final Psychosocial Re-Evaluation):  Psychosocial Re-Evaluation - 12/14/22 1122       Psychosocial Re-Evaluation   Current issues with None Identified    Comments Reviewed patient health questionnaire (PHQ-9) with patient for follow up. Previously, patients score indicated signs/symptoms of depression.  Reviewed to see if patient is improving symptom wise while in program.  Score improved and patient states that it is because he has been exercising more.    Expected Outcomes Short: Continue to attend [facility] regularly for regular exercise and social engagement. Long: Continue to improve symptoms and manage a positive mental state.    Interventions Encouraged to attend Pulmonary Rehabilitation for the exercise    Continue Psychosocial Services  Follow up required by staff             Vocational Rehabilitation: Provide vocational rehab assistance to qualifying candidates.   Vocational Rehab Evaluation & Intervention:   Education: Education Goals: Education classes will be provided on a variety of topics geared toward better understanding of heart health and risk factor modification. Participant will state understanding/return  demonstration of topics presented as noted by education test scores.  Learning Barriers/Preferences:  Learning Barriers/Preferences - 10/12/22 0914       Learning Barriers/Preferences   Learning Barriers None    Learning Preferences None             General Cardiac Education Topics:  AED/CPR: - Group verbal and written instruction with the use of models to demonstrate the basic use of the AED with the basic ABC's of resuscitation.   Anatomy and Cardiac Procedures: - Group verbal and visual presentation and models provide information about basic cardiac anatomy and function. Reviews the testing methods done to diagnose heart disease and the outcomes of the test results. Describes the treatment choices: Medical Management, Angioplasty, or Coronary Bypass Surgery for treating various heart conditions including Myocardial Infarction, Angina, Valve Disease, and Cardiac Arrhythmias.  Written material given at graduation.   Medication Safety: - Group verbal and visual instruction to review commonly prescribed medications for heart and lung disease. Reviews the medication, class of the drug, and side effects. Includes the steps to properly store meds and maintain the prescription regimen.  Written material given at graduation.   Intimacy: - Group verbal instruction through game format to discuss how heart and lung disease can affect sexual intimacy. Written material given at graduation..   Know Your Numbers and Heart Failure: - Group verbal and visual instruction to discuss disease risk factors for cardiac and pulmonary disease and treatment options.  Reviews associated critical values for Overweight/Obesity, Hypertension, Cholesterol, and Diabetes.  Discusses basics of heart failure: signs/symptoms and treatments.  Introduces Heart Failure Zone chart for action plan for heart failure.  Written material given at graduation.   Infection Prevention: - Provides verbal and written material  to individual with discussion of infection control including proper hand washing and proper equipment cleaning during exercise session. Flowsheet Row Pulmonary Rehab from 10/31/2022 in [facility] Cardiac and Pulmonary Rehab  Date 10/31/22  Educator MB  Instruction Review Code 1- Verbalizes Understanding       Falls Prevention: - Provides verbal and written material to individual with discussion  of falls prevention and safety. Flowsheet Row Pulmonary Rehab from 10/31/2022 in [facility] Cardiac and Pulmonary Rehab  Date 10/31/22  Educator MB  Instruction Review Code 1- Verbalizes Understanding       Other: -Provides group and verbal instruction on various topics (see comments)   Knowledge Questionnaire Score:   Core Components/Risk Factors/Patient Goals at Admission:  Personal Goals and Risk Factors at Admission - 10/31/22 1429       Core Components/Risk Factors/Patient Goals on Admission    Weight Management Yes;Weight Maintenance    Intervention Weight Management: Develop a combined nutrition and exercise program designed to reach desired caloric intake, while maintaining appropriate intake of nutrient and fiber, sodium and fats, and appropriate energy expenditure required for the weight goal.;Weight Management: Provide education and appropriate resources to help participant work on and attain dietary goals.;Weight Management/Obesity: Establish reasonable short term and long term weight goals.    Admit Weight 185 lb 1.6 oz (84 kg)    Goal Weight: Short Term 185 lb 1.6 oz (84 kg)    Goal Weight: Long Term 185 lb 1.6 oz (84 kg)    Expected Outcomes Short Term: Continue to assess and modify interventions until short term weight is achieved;Long Term: Adherence to nutrition and physical activity/exercise program aimed toward attainment of established weight goal;Weight Maintenance: Understanding of the daily nutrition guidelines, which includes 25-35% calories from fat, 7% or less cal from  saturated fats, less than 200mg  cholesterol, less than 1.5gm of sodium, & 5 or more servings of fruits and vegetables daily;Understanding recommendations for meals to include 15-35% energy as protein, 25-35% energy from fat, 35-60% energy from carbohydrates, less than 200mg  of dietary cholesterol, 20-35 gm of total fiber daily;Understanding of distribution of calorie intake throughout the day with the consumption of 4-5 meals/snacks    Improve shortness of breath with ADL's Yes    Intervention Provide education, individualized exercise plan and daily activity instruction to help decrease symptoms of SOB with activities of daily living.    Expected Outcomes Short Term: Improve cardiorespiratory fitness to achieve a reduction of symptoms when performing ADLs;Long Term: Be able to perform more ADLs without symptoms or delay the onset of symptoms    Heart Failure Yes    Intervention Provide a combined exercise and nutrition program that is supplemented with education, support and counseling about heart failure. Directed toward relieving symptoms such as shortness of breath, decreased exercise tolerance, and extremity edema.    Expected Outcomes Improve functional capacity of life;Short term: Attendance in program 2-3 days a week with increased exercise capacity. Reported lower sodium intake. Reported increased fruit and vegetable intake. Reports medication compliance.;Short term: Daily weights obtained and reported for increase. Utilizing diuretic protocols set by physician.;Long term: Adoption of self-care skills and reduction of barriers for early signs and symptoms recognition and intervention leading to self-care maintenance.    Hypertension Yes    Intervention Provide education on lifestyle modifcations including regular physical activity/exercise, weight management, moderate sodium restriction and increased consumption of fresh fruit, vegetables, and low fat dairy, alcohol moderation, and smoking  cessation.;Monitor prescription use compliance.    Expected Outcomes Short Term: Continued assessment and intervention until BP is < 140/17mm HG in hypertensive participants. < 130/34mm HG in hypertensive participants with diabetes, heart failure or chronic kidney disease.;Long Term: Maintenance of blood pressure at goal levels.             Education:Diabetes - Individual verbal and written instruction to review signs/symptoms of diabetes, desired ranges  of glucose level fasting, after meals and with exercise. Acknowledge that pre and post exercise glucose checks will be done for 3 sessions at entry of program.   Core Components/Risk Factors/Patient Goals Review:   Goals and Risk Factor Review     Row Name 12/14/22 1116             Core Components/Risk Factors/Patient Goals Review   Personal Goals Review Improve shortness of breath with ADL's       Review Spoke to patient about their shortness of breath and what they can do to improve. Patient has been informed of breathing techniques when starting the program. Patient is informed to tell staff if they have had any med changes and that certain meds they are taking or not taking can be causing shortness of breath.       Expected Outcomes Short: Attend [facility] regularly to improve shortness of breath with ADL's. Long: maintain independence with ADL's                Core Components/Risk Factors/Patient Goals at Discharge (Final Review):   Goals and Risk Factor Review - 12/14/22 1116       Core Components/Risk Factors/Patient Goals Review   Personal Goals Review Improve shortness of breath with ADL's    Review Spoke to patient about their shortness of breath and what they can do to improve. Patient has been informed of breathing techniques when starting the program. Patient is informed to tell staff if they have had any med changes and that certain meds they are taking or not taking can be causing shortness of breath.     Expected Outcomes Short: Attend [facility] regularly to improve shortness of breath with ADL's. Long: maintain independence with ADL's             ITP Comments:  ITP Comments     Row Name 10/12/22 0911 10/31/22 1413 11/02/22 1134 11/15/22 1239 12/13/22 0855   ITP Comments Virtual Visit completed. Patient informed on EP and RD appointment and 6 Minute walk test. Patient also informed of patient health questionnaires on My Chart. Patient Verbalizes understanding. Visit diagnosis can be found in [facility] 10/31/2022. Completed and gym orientation. Initial ITP created and sent for review to Dr. Vida Rigger, Medical Director. First full day of exercise!  Patient was oriented to gym and equipment including functions, settings, policies, and procedures.  Patient's individual exercise prescription and treatment plan were reviewed.  All starting workloads were established based on the results of the 6 minute walk test done at initial orientation visit.  The plan for exercise progression was also introduced and progression will be customized based on patient's performance and goals. 30 Day review completed. Medical Director ITP review done, changes made as directed, and signed approval by Medical Director.    new to program 30 Day review completed. Medical Director ITP review done, changes made as directed, and signed approval by Medical Director.    Row Name 01/03/23 0933 01/16/23 1149 01/31/23 1148       ITP Comments 30 Day review completed. Medical Director ITP review done, changes made as directed, and signed approval by Medical Director. Returning after being out for medical reason 30 Day review completed. Medical Director ITP review done, changes made as directed, and signed approval by Medical Director.   remains out for medical reason              Comments:

## 2023-02-01 ENCOUNTER — Telehealth: Payer: Self-pay | Admitting: *Deleted

## 2023-02-01 ENCOUNTER — Encounter: Payer: Medicare HMO | Admitting: *Deleted

## 2023-02-01 NOTE — Telephone Encounter (Signed)
Louis [name] has not attended rehab since 12/3. He is currently admitted at [facility]. Will follow up after discharge.

## 2023-02-08 ENCOUNTER — Encounter: Payer: Medicare HMO | Admitting: *Deleted

## 2023-02-08 ENCOUNTER — Encounter: Payer: Self-pay | Admitting: *Deleted

## 2023-02-08 NOTE — Telephone Encounter (Signed)
Attempted to call pt to check on him. Per chart he was discharged from the hospital 12/20 for "weakness". No answer at this time. Lmtcb x 1.

## 2023-02-11 DIAGNOSIS — I770 Arteriovenous fistula, acquired: Principal | ICD-10-CM

## 2023-02-12 ENCOUNTER — Encounter: Payer: Self-pay | Admitting: *Deleted

## 2023-02-12 NOTE — Telephone Encounter (Signed)
Spoke to Alicia. 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 He is going today to have it checked. This has caused him to not feel well and unable to attend rehab. He said he will plan to resume this week as scheduled 1/2. Advised that he call to let us know if unable to make it. Pt voiced understanding.

## 2023-02-13 ENCOUNTER — Ambulatory Visit: Admit: 2023-02-13 | Discharge: 2023-02-14 | Payer: PRIVATE HEALTH INSURANCE

## 2023-02-13 MED ORDER — DOXYCYCLINE HYCLATE 100 MG CAPSULE
ORAL_CAPSULE | Freq: Two times a day (BID) | ORAL | 1 refills | 30.00 days
Start: 2023-02-13 — End: ?

## 2023-02-15 ENCOUNTER — Encounter: Payer: Self-pay | Admitting: *Deleted

## 2023-02-15 DIAGNOSIS — I5022 Chronic systolic (congestive) heart failure: Secondary | ICD-10-CM

## 2023-02-15 NOTE — Telephone Encounter (Signed)
 Spoke to Louis [name]. States his fistula was repaired, however, he is still not feeling well with no energy and they do not know what is causing his symptoms. He is under current workup to determine cause of weakness and is unable to return to rehab at this time. Louis [name] is aware that if he is able to return in the future then his doctor may send a new referral.

## 2023-02-15 NOTE — Progress Notes (Signed)
 Pulmonary Individual Treatment Plan  Patient Details  Name: Louis [name] MRN: [id number] Date of Birth: 12/10/1960 Referring Provider:   Flowsheet Row Pulmonary Rehab from 10/31/2022 in [facility] Re[facility]  Referring Provider Dr. Deward Grumet       Initial Encounter Date:  Flowsheet Row Pulmonary Rehab from 10/31/2022 in [facility] Cardiac and Pulmonary Rehab  Date 10/31/22       Visit Diagnosis: Heart failure, chronic systolic (HCC)  Patient's Home Medications on Admission:  Current Outpatient Medications:    AURYXIA  1 GM 210 MG(Fe) tablet, Take 1 tablet by mouth 4 (four) times daily -  before meals and at bedtime. (Patient not taking: Reported on 10/12/2022), Disp: , Rfl:    B Complex-C-Folic Acid  (RENA-VITE RX) 1 MG TABS, Take 1 tablet by mouth daily. (Patient not taking: Reported on 04/25/2022), Disp: , Rfl:    BIKTARVY 50-200-25 MG TABS tablet, Take 1 tablet by mouth daily., Disp: , Rfl:    calcium  carbonate (TUMS) 500 MG chewable tablet, Chew by mouth. (Patient not taking: Reported on 10/12/2022), Disp: , Rfl:    carvedilol  (COREG ) 12.5 MG tablet, Take by mouth., Disp: , Rfl:    clindamycin (CLEOCIN T) 1 % SWAB, Apply topically., Disp: , Rfl:    clobetasol ointment (TEMOVATE) 0.05 %, Apply topically 2 (two) times daily as needed. (Patient not taking: Reported on 10/12/2022), Disp: , Rfl:    DESCOVY  200-25 MG tablet, Take 1 tablet by mouth daily., Disp: , Rfl:    diclofenac  Sodium (VOLTAREN ) 1 % GEL, Apply 4 g topically 4 (four) times daily. (Patient not taking: Reported on 04/25/2022), Disp: 100 g, Rfl: 1   methocarbamol  (ROBAXIN ) 500 MG tablet, TAKE 1 TABLET (500 MG TOTAL) BY MOUTH 2 (TWO) TIMES DAILY AS NEEDED. FOR PAIN, Disp: 60 tablet, Rfl: 0   minoxidil (LONITEN) 2.5 MG tablet, Take 2.5 mg by mouth daily. (Patient not taking: Reported on 04/25/2022), Disp: , Rfl:    ondansetron  (ZOFRAN -ODT) 4 MG disintegrating tablet, Take 4 mg by mouth every 8 (eight) hours as needed.  (Patient not taking: Reported on 10/12/2022), Disp: , Rfl:    predniSONE  (DELTASONE ) 10 MG tablet, Take 1 tablet (10 mg total) by mouth 2 (two) times daily as needed. Prn gout attack for 2 days, Disp: 10 tablet, Rfl: 0   prochlorperazine  (COMPAZINE ) 5 MG tablet, Take 1 tablet (5 mg total) by mouth every 6 (six) hours as needed for nausea or vomiting (hiccups)., Disp: 15 tablet, Rfl: 0   Skin Protectants, Misc. (AQUAGARD HYDRATING) 41 % OINT, Apply topically 2 (two) times daily., Disp: , Rfl:    TIVICAY  50 MG tablet, Take 1 tablet by mouth daily. (Patient not taking: Reported on 04/25/2022), Disp: , Rfl:    valACYclovir  (VALTREX ) 500 MG tablet, Take 500 mg by mouth daily as needed (for flare ups). , Disp: , Rfl:    valACYclovir  (VALTREX ) 500 MG tablet, Take by mouth. (Patient not taking: Reported on 10/12/2022), Disp: , Rfl:   Past Medical History: Past Medical History:  Diagnosis Date   Chronic kidney disease    esrd   Dermatophytosis of foot 06/23/2004   Environmental allergies    ESRD (end stage renal disease) on dialysis (HCC)    Gout    HIV infection (HCC) 12/1995   Hypertension    Seborrhea 06/23/2004    Tobacco Use: Social History   Tobacco Use  Smoking Status Never  Smokeless Tobacco Never    Labs: Review Flowsheet  Latest Ref Rng & Units 06/09/2010 08/09/2011 08/23/2016  Labs for ITP Cardiac and Pulmonary Rehab  Cholestrol 0 - 200 mg/dL 813  820  -  LDL (calc) 0 - 99 mg/dL 882  892  -  HDL-C >60 mg/dL 55  54  -  Trlycerides <150 mg/dL 69  89  -  Hemoglobin J8r 4.8 - 5.6 % - - 5.1      Pulmonary Assessment Scores:  Pulmonary Assessment Scores     Row Name 10/31/22 1447         mMRC Score   mMRC Score 0              UCSD: Self-administered rating of dyspnea associated with activities of daily living (ADLs) 6-point scale (0 = not at all to 5 = maximal or unable to do because of breathlessness)  Scoring Scores range from 0 to 120.  Minimally  important difference is 5 units  CAT: CAT can identify the health impairment of COPD patients and is better correlated with disease progression.  CAT has a scoring range of zero to 40. The CAT score is classified into four groups of low (less than 10), medium (10 - 20), high (21-30) and very high (31-40) based on the impact level of disease on health status. A CAT score over 10 suggests significant symptoms.  A worsening CAT score could be explained by an exacerbation, poor medication adherence, poor inhaler technique, or progression of COPD or comorbid conditions.  CAT MCID is 2 points  mMRC: mMRC (Modified Medical Research Council) Dyspnea Scale is used to assess the degree of baseline functional disability in patients of respiratory disease due to dyspnea. No minimal important difference is established. A decrease in score of 1 point or greater is considered a positive change.   Pulmonary Function Assessment:  Pulmonary Function Assessment - 10/12/22 0913       Breath   Shortness of Breath No             Exercise Target Goals: Exercise Program Goal: Individual exercise prescription set using results from initial 6 min walk test and THRR while considering  patient's activity barriers and safety.   Exercise Prescription Goal: Initial exercise prescription builds to 30-45 minutes a day of aerobic activity, 2-3 days per week.  Home exercise guidelines will be given to patient during program as part of exercise prescription that the participant will acknowledge.  Education: Aerobic Exercise: - Group verbal and visual presentation on the components of exercise prescription. Introduces F.I.T.T principle from ACSM for exercise prescriptions.  Reviews F.I.T.T. principles of aerobic exercise including progression. Written material given at graduation.   Education: Resistance Exercise: - Group verbal and visual presentation on the components of exercise prescription. Introduces F.I.T.T  principle from ACSM for exercise prescriptions  Reviews F.I.T.T. principles of resistance exercise including progression. Written material given at graduation.    Education: Exercise & Equipment Safety: - Individual verbal instruction and demonstration of equipment use and safety with use of the equipment. Flowsheet Row Pulmonary Rehab from 10/31/2022 in [facility] Cardiac and Pulmonary Rehab  Date 10/31/22  Educator MB  Instruction Review Code 1- Verbalizes Understanding       Education: Exercise Physiology & General Exercise Guidelines: - Group verbal and written instruction with models to review the exercise physiology of the cardiovascular system and associated critical values. Provides general exercise guidelines with specific guidelines to those with heart or lung disease.    Education: Flexibility, Balance, Mind/Body Relaxation: - Group verbal  and visual presentation with interactive activity on the components of exercise prescription. Introduces F.I.T.T principle from ACSM for exercise prescriptions. Reviews F.I.T.T. principles of flexibility and balance exercise training including progression. Also discusses the mind body connection.  Reviews various relaxation techniques to help reduce and manage stress (i.e. Deep breathing, progressive muscle relaxation, and visualization). Balance handout provided to take home. Written material given at graduation.   Activity Barriers & Risk Stratification:  Activity Barriers & Cardiac Risk Stratification - 10/31/22 1416       Activity Barriers & Cardiac Risk Stratification   Activity Barriers None             6 Minute Walk:  6 Minute Walk     Row Name 10/31/22 1413         6 Minute Walk   Phase Initial     Distance 1380 feet     Walk Time 6 minutes     # of Rest Breaks 0     MPH 2.6     METS 4.34     RPE 11     Perceived Dyspnea  0     VO2 Peak 15.19     Symptoms Yes (comment)     Comments Blood Pressure was high today, no  symptoms from it, but waited for it to go down prior to starting     Resting HR 57 bpm     Resting BP 160/100     Resting Oxygen  Saturation  94 %     Exercise Oxygen  Saturation  during 6 min walk 77 %     Max Ex. HR 86 bpm     Max Ex. BP 218/110     2 Minute Post BP 190/110       Interval HR   1 Minute HR 65     2 Minute HR 76     3 Minute HR 81     4 Minute HR 81     5 Minute HR 82     6 Minute HR 86     Interval Heart Rate? Yes       Interval Oxygen    Interval Oxygen ? Yes     Baseline Oxygen  Saturation % 94 %     1 Minute Oxygen  Saturation % 90 %     1 Minute Liters of Oxygen  0 L     2 Minute Oxygen  Saturation % 85 %     2 Minute Liters of Oxygen  0 L     3 Minute Oxygen  Saturation % 83 %     3 Minute Liters of Oxygen  0 L     4 Minute Oxygen  Saturation % 81 %     4 Minute Liters of Oxygen  0 L     5 Minute Oxygen  Saturation % 77 %     5 Minute Liters of Oxygen  0 L     6 Minute Oxygen  Saturation % 84 %     6 Minute Liters of Oxygen  0 L     2 Minute Post Oxygen  Saturation % 94 %     2 Minute Post Liters of Oxygen  0 L             Oxygen  Initial Assessment:  Oxygen  Initial Assessment - 10/12/22 0913       Home Oxygen    Home Oxygen  Device None    Sleep Oxygen  Prescription None    Home Exercise Oxygen  Prescription None    Home Resting Oxygen  Prescription None  Initial 6 min Walk   Oxygen  Used None      Program Oxygen  Prescription   Program Oxygen  Prescription None      Intervention   Short Term Goals To learn and understand importance of maintaining oxygen  saturations>88%;To learn and demonstrate proper pursed lip breathing techniques or other breathing techniques. ;To learn and understand importance of monitoring SPO2 with pulse oximeter and demonstrate accurate use of the pulse oximeter.;To learn and exhibit compliance with exercise, home and travel O2 prescription    Long  Term Goals Exhibits compliance with exercise, home  and travel O2  prescription;Verbalizes importance of monitoring SPO2 with pulse oximeter and return demonstration;Maintenance of O2 saturations>88%;Exhibits proper breathing techniques, such as pursed lip breathing or other method taught during program session;Compliance with respiratory medication             Oxygen  Re-Evaluation:  Oxygen  Re-Evaluation     Row Name 11/02/22 1135 12/14/22 1118           Program Oxygen  Prescription   Program Oxygen  Prescription -- None        Home Oxygen    Home Oxygen  Device -- None      Sleep Oxygen  Prescription -- None      Home Exercise Oxygen  Prescription -- None      Home Resting Oxygen  Prescription -- None        Goals/Expected Outcomes   Short Term Goals -- To learn and demonstrate proper pursed lip breathing techniques or other breathing techniques.       Long  Term Goals -- Exhibits proper breathing techniques, such as pursed lip breathing or other method taught during program session      Comments Reviewed PLB technique with pt.  Talked about how it works and it's importance in maintaining their exercise saturations. Informed patient how to perform the Pursed Lipped breathing technique. Told patient to Inhale through the nose and out the mouth with pursed lips to keep their airways open, help oxygenate them better, practice when at rest or doing strenuous activity. Patient Verbalizes understanding of technique and will work on and be reiterated during [facility].      Goals/Expected Outcomes Short: Become more profiecient at using PLB. Long: Become independent at using PLB. Short: use PLB with exertion. Long: use PLB on exertion proficiently and independently.               Oxygen  Discharge (Final Oxygen  Re-Evaluation):  Oxygen  Re-Evaluation - 12/14/22 1118       Program Oxygen  Prescription   Program Oxygen  Prescription None      Home Oxygen    Home Oxygen  Device None    Sleep Oxygen  Prescription None    Home Exercise Oxygen  Prescription None     Home Resting Oxygen  Prescription None      Goals/Expected Outcomes   Short Term Goals To learn and demonstrate proper pursed lip breathing techniques or other breathing techniques.     Long  Term Goals Exhibits proper breathing techniques, such as pursed lip breathing or other method taught during program session    Comments Informed patient how to perform the Pursed Lipped breathing technique. Told patient to Inhale through the nose and out the mouth with pursed lips to keep their airways open, help oxygenate them better, practice when at rest or doing strenuous activity. Patient Verbalizes understanding of technique and will work on and be reiterated during [facility].    Goals/Expected Outcomes Short: use PLB with exertion. Long: use PLB on exertion proficiently and independently.  Initial Exercise Prescription:  Initial Exercise Prescription - 10/31/22 1400       Date of Initial Exercise RX and Referring Provider   Date 10/31/22    Referring Provider Dr. Deward Grumet      Oxygen    Maintain Oxygen  Saturation 88% or higher      Treadmill   MPH 2.6    Grade 0    Minutes 15    METs 2.99      Recumbant Bike   Level 3    RPM 50    Watts 50    Minutes 15    METs 4.34      NuStep   Level 3    SPM 80    Minutes 15    METs 4.34      T5 Nustep   Level 3    SPM 80    Minutes 15    METs 4.34      Prescription Details   Frequency (times per week) 2    Duration Progress to 30 minutes of continuous aerobic without signs/symptoms of physical distress      Intensity   THRR 40-80% of Max Heartrate 98-139    Ratings of Perceived Exertion 11-13    Perceived Dyspnea 0-4      Progression   Progression Continue to progress workloads to maintain intensity without signs/symptoms of physical distress.      Resistance Training   Training Prescription Yes    Weight 5 lb    Reps 10-15             Perform Capillary Blood Glucose checks as needed.  Exercise  Prescription Changes:   Exercise Prescription Changes     Row Name 10/31/22 1400 11/17/22 1000 12/01/22 1000 12/14/22 1500 01/25/23 1600     Response to Exercise   Blood Pressure (Admit) 160/100 180/90 156/88 142/62 132/72   Blood Pressure (Exercise) 218/110 190/90 158/86 160/80 --   Blood Pressure (Exit) 180/100 132/62 154/74 128/64 170/70   Heart Rate (Admit) 57 bpm 66 bpm 69 bpm 82 bpm 88 bpm   Heart Rate (Exercise) 86 bpm 99 bpm 86 bpm 100 bpm 104 bpm   Heart Rate (Exit) 59 bpm 72 bpm 76 bpm 85 bpm 92 bpm   Oxygen  Saturation (Admit) 94 % 95 % 96 % 92 % 97 %   Oxygen  Saturation (Exercise) 77 % 90 % 94 % 88 % 95 %   Oxygen  Saturation (Exit) 97 % 93 % 96 % 92 % 98 %   Rating of Perceived Exertion (Exercise) 11 13 12 14 12    Perceived Dyspnea (Exercise) 0 0 2 0 0   Symptoms BP was high today, no symptoms from it, but waited for it to go down prior to -- none none none   Comments results First full week of exercise -- -- --   Duration Progress to 30 minutes of  aerobic without signs/symptoms of physical distress Continue with 30 min of aerobic exercise without signs/symptoms of physical distress. Continue with 30 min of aerobic exercise without signs/symptoms of physical distress. Continue with 30 min of aerobic exercise without signs/symptoms of physical distress. Continue with 30 min of aerobic exercise without signs/symptoms of physical distress.   Intensity THRR New THRR New THRR New THRR New THRR unchanged     Progression   Progression Continue to progress workloads to maintain intensity without signs/symptoms of physical distress. Continue to progress workloads to maintain intensity without signs/symptoms of physical distress. Continue  to progress workloads to maintain intensity without signs/symptoms of physical distress. Continue to progress workloads to maintain intensity without signs/symptoms of physical distress. Continue to progress workloads to maintain intensity  without signs/symptoms of physical distress.   Average METs 4.34 4.34 2.71 2.93 2.9     Resistance Training   Training Prescription -- Yes Yes Yes Yes   Weight -- 5 lb 5 lb 5 lb 5 lb   Reps -- 10-15 10-15 10-15 10-15     Interval Training   Interval Training -- No No No No     Treadmill   MPH -- 2.6 -- 2 --   Grade -- 0 -- 0 --   Minutes -- 15 -- 15 --   METs -- 2.99 -- 2.53 --     Recumbant Bike   Level -- 3 3 1  --   Watts -- 22 22 20  --   Minutes -- 15 15 15  --   METs -- 2.82 2.81 2.74 --     NuStep   Level -- 3 -- 4 1   Minutes -- 15 -- 15 15   METs -- 2.4 -- 3.9 2.9     REL-XR   Level -- -- 2 3 --   Minutes -- -- 30 15 --   METs -- -- 3.5 -- --     T5 Nustep   Level -- 3 3 -- --   Minutes -- 15 15 -- --   METs -- 2.2 2.3 -- --     Oxygen    Maintain Oxygen  Saturation -- 88% or higher 88% or higher 88% or higher 88% or higher            Exercise Comments:   Exercise Comments     Row Name 11/02/22 1134           Exercise Comments First full day of exercise!  Patient was oriented to gym and equipment including functions, settings, policies, and procedures.  Patient's individual exercise prescription and treatment plan were reviewed.  All starting workloads were established based on the results of the 6 minute walk test done at initial orientation visit.  The plan for exercise progression was also introduced and progression will be customized based on patient's performance and goals.                Exercise Goals and Review:   Exercise Goals     Row Name 10/31/22 1426             Exercise Goals   Increase Physical Activity Yes       Intervention Provide advice, education, support and counseling about physical activity/exercise needs.;Develop an individualized exercise prescription for aerobic and resistive training based on initial evaluation findings, risk stratification, comorbidities and participant's personal goals.       Expected  Outcomes Short Term: Attend rehab on a regular basis to increase amount of physical activity.;Long Term: Add in home exercise to make exercise part of routine and to increase amount of physical activity.;Long Term: Exercising regularly at least 3-5 days a week.       Increase Strength and Stamina Yes       Intervention Provide advice, education, support and counseling about physical activity/exercise needs.;Develop an individualized exercise prescription for aerobic and resistive training based on initial evaluation findings, risk stratification, comorbidities and participant's personal goals.       Expected Outcomes Short Term: Increase workloads from initial exercise prescription for resistance, speed, and METs.;Short Term: Perform resistance  training exercises routinely during rehab and add in resistance training at home;Long Term: Improve cardiorespiratory fitness, muscular endurance and strength as measured by increased METs and functional capacity ( )       Able to understand and use rate of perceived exertion (RPE) scale Yes       Intervention Provide education and explanation on how to use RPE scale       Expected Outcomes Short Term: Able to use RPE daily in rehab to express subjective intensity level;Long Term:  Able to use RPE to guide intensity level when exercising independently       Able to understand and use Dyspnea scale Yes       Intervention Provide education and explanation on how to use Dyspnea scale       Expected Outcomes Short Term: Able to use Dyspnea scale daily in rehab to express subjective sense of shortness of breath during exertion;Long Term: Able to use Dyspnea scale to guide intensity level when exercising independently       Knowledge and understanding of Target Heart Rate Range (THRR) Yes       Intervention Provide education and explanation of THRR including how the numbers were predicted and where they are located for reference       Expected Outcomes Short Term:  Able to state/look up THRR;Short Term: Able to use daily as guideline for intensity in rehab;Long Term: Able to use THRR to govern intensity when exercising independently       Able to check pulse independently Yes       Intervention Provide education and demonstration on how to check pulse in carotid and radial arteries.;Review the importance of being able to check your own pulse for safety during independent exercise       Expected Outcomes Short Term: Able to explain why pulse checking is important during independent exercise;Long Term: Able to check pulse independently and accurately       Understanding of Exercise Prescription Yes       Intervention Provide education, explanation, and written materials on patient's individual exercise prescription       Expected Outcomes Short Term: Able to explain program exercise prescription;Long Term: Able to explain home exercise prescription to exercise independently                Exercise Goals Re-Evaluation :  Exercise Goals Re-Evaluation     Row Name 11/02/22 1134 11/17/22 1036 12/01/22 1037 12/14/22 1559 01/10/23 1103     Exercise Goal Re-Evaluation   Exercise Goals Review Able to understand and use rate of perceived exertion (RPE) scale;Able to understand and use Dyspnea scale;Knowledge and understanding of Target Heart Rate Range (THRR);Understanding of Exercise Prescription Understanding of Exercise Prescription;Increase Physical Activity;Increase Strength and Stamina Understanding of Exercise Prescription;Increase Physical Activity;Increase Strength and Stamina Understanding of Exercise Prescription;Increase Physical Activity;Increase Strength and Stamina Understanding of Exercise Prescription;Increase Physical Activity;Increase Strength and Stamina   Comments Reviewed RPE  and dyspnea scale, THR and program prescription with pt today.  Pt voiced understanding and was given a copy of goals to take home. Lerry is off to a good start in the  program. He is doing well with the treadmill at a speed of 2.6 mph with no incline. He also has done well with the recumbent bike, T4 nustep, and T5 nustep at level 3. We will continue to monitor his progress in the program. Fisher is doing well in the program. He continues to work at level 3 on the recumbent bike  and T5 nustep. He also began using the XR at level 2 and did wellwith it. He has not done any walking in rehab at this time. We will continue to monitor his progress in the program. Kadan continues to do well in rehab. He increased to level 4 on the T4 nustep and level 3 on the XR. He also decreased his speed on the treadmill from 2.6 mph down to 2 mph with no incline. We will continue to monitor his progress in the program. Daniell has not attended rehab since 10/31. We will continue to reach out to determine his status. We will continue to monitor his progress in the program upon return.   Expected Outcomes Short: Use RPE daily to regulate intensity. Long: Follow program prescription in THR. Short: Continue to follow current exercise prescription. Long: Continue exercise to improve strength and stamina. Short: Begin walking treadmill or track in rehab. Long: Continue exercise to improve strength and stamina. Short: Increase treadmill workload back up to previous speed. Long: Continue exercise to improve strength and stamina. Short: attend the program. Long: Continue exercise to improve strength and stamina.    Row Name 01/25/23 1615 02/08/23 1511           Exercise Goal Re-Evaluation   Exercise Goals Review Understanding of Exercise Prescription;Increase Physical Activity;Increase Strength and Stamina Understanding of Exercise Prescription;Increase Physical Activity;Increase Strength and Stamina      Comments Emilio has only attended rehab once since the last review. During this one session he was able to work at level 1 on the T4 nustep for 30 minutes, which was a decreased workload for  [employer]. We will remind him of the importance of good attendance in order to see improvements with his aerobic exercise. We will continue to monitor his progress in the program. Copelan has not attended rehab during this review. The last time he attended rehab was 12/03. We will reach out in order to determine his status in the program.      Expected Outcomes Short: Attend rehab more consistently. Long: Continue exercise to improve strength and stamina. Short: Attend rehab. Long: Continue exercise to improve strength and stamina.               Discharge Exercise Prescription (Final Exercise Prescription Changes):  Exercise Prescription Changes - 01/25/23 1600       Response to Exercise   Blood Pressure (Admit) 132/72    Blood Pressure (Exit) 170/70    Heart Rate (Admit) 88 bpm    Heart Rate (Exercise) 104 bpm    Heart Rate (Exit) 92 bpm    Oxygen  Saturation (Admit) 97 %    Oxygen  Saturation (Exercise) 95 %    Oxygen  Saturation (Exit) 98 %    Rating of Perceived Exertion (Exercise) 12    Perceived Dyspnea (Exercise) 0    Symptoms none    Duration Continue with 30 min of aerobic exercise without signs/symptoms of physical distress.    Intensity THRR unchanged      Progression   Progression Continue to progress workloads to maintain intensity without signs/symptoms of physical distress.    Average METs 2.9      Resistance Training   Training Prescription Yes    Weight 5 lb    Reps 10-15      Interval Training   Interval Training No      NuStep   Level 1    Minutes 15    METs 2.9      Oxygen   Maintain Oxygen  Saturation 88% or higher             Nutrition:  Target Goals: Understanding of nutrition guidelines, daily intake of sodium 1500mg , cholesterol 200mg , calories 30% from fat and 7% or less from saturated fats, daily to have 5 or more servings of fruits and vegetables.  Education: All About Nutrition: -Group instruction provided by verbal, written material,  interactive activities, discussions, models, and posters to present general guidelines for heart healthy nutrition including fat, fiber, MyPlate, the role of sodium in heart healthy nutrition, utilization of the nutrition label, and utilization of this knowledge for meal planning. Follow up email sent as well. Written material given at graduation.   Biometrics:  Pre Biometrics - 10/31/22 1427       Pre Biometrics   Height 6' 1.5 (1.867 m)    Weight 185 lb 1.6 oz (84 kg)    Waist Circumference 37 inches    Hip Circumference 42 inches    Waist to Hip Ratio 0.88 %    BMI (Calculated) 24.09    Single Leg Stand 30 seconds              Nutrition Therapy Plan and Nutrition Goals:  Nutrition Therapy & Goals - 10/31/22 1428       Nutrition Therapy   RD appointment deferred Yes      Personal Nutrition Goals   Nutrition Goal RD appointment deferred, already met with one with dialysis      Intervention Plan   Intervention Prescribe, educate and counsel regarding individualized specific dietary modifications aiming towards targeted core components such as weight, hypertension, lipid management, diabetes, heart failure and other comorbidities.;Nutrition handout(s) given to patient.    Expected Outcomes Short Term Goal: Understand basic principles of dietary content, such as calories, fat, sodium, cholesterol and nutrients.;Long Term Goal: Adherence to prescribed nutrition plan.;Short Term Goal: A plan has been developed with personal nutrition goals set during dietitian appointment.             Nutrition Assessments:  MEDIFICTS Score Key: >=70 Need to make dietary changes  40-70 Heart Healthy Diet <= 40 Therapeutic Level Cholesterol Diet   Picture Your Plate Scores: <59 Unhealthy dietary pattern with much room for improvement. 41-50 Dietary pattern unlikely to meet recommendations for good health and room for improvement. 51-60 More healthful dietary pattern, with some room  for improvement.  >60 Healthy dietary pattern, although there may be some specific behaviors that could be improved.   Nutrition Goals Re-Evaluation:  Nutrition Goals Re-Evaluation     Row Name 12/14/22 1117             Goals   Comment Patient was informed on why it is important to maintain a balanced diet when dealing with Respiratory issues. Explained that it takes a lot of energy to breath and when they are short of breath often they will need to have a good diet to help keep up with the calories they are expending for breathing.       Expected Outcome Short: Choose and plan snacks accordingly to patients caloric intake to improve breathing. Long: Maintain a diet independently that meets their caloric intake to aid in daily shortness of breath.                Nutrition Goals Discharge (Final Nutrition Goals Re-Evaluation):  Nutrition Goals Re-Evaluation - 12/14/22 1117       Goals   Comment Patient was informed on why it is important  to maintain a balanced diet when dealing with Respiratory issues. Explained that it takes a lot of energy to breath and when they are short of breath often they will need to have a good diet to help keep up with the calories they are expending for breathing.    Expected Outcome Short: Choose and plan snacks accordingly to patients caloric intake to improve breathing. Long: Maintain a diet independently that meets their caloric intake to aid in daily shortness of breath.             Psychosocial: Target Goals: Acknowledge presence or absence of significant depression and/or stress, maximize coping skills, provide positive support system. Participant is able to verbalize types and ability to use techniques and skills needed for reducing stress and depression.   Education: Stress, Anxiety, and Depression - Group verbal and visual presentation to define topics covered.  Reviews how body is impacted by stress, anxiety, and depression.  Also  discusses healthy ways to reduce stress and to treat/manage anxiety and depression.  Written material given at graduation.   Education: Sleep Hygiene -Provides group verbal and written instruction about how sleep can affect your health.  Define sleep hygiene, discuss sleep cycles and impact of sleep habits. Review good sleep hygiene tips.    Initial Review & Psychosocial Screening:  Initial Psych Review & Screening - 10/12/22 0914       Initial Review   Current issues with None Identified      Family Dynamics   Good Support System? Yes    Comments Patient reports no issues with their current mental states, sleep, stress, depression or anxiety. Will follow up with patient in a few weeks for any changes.      Barriers   Psychosocial barriers to participate in program There are no identifiable barriers or psychosocial needs.      Screening Interventions   Interventions Encouraged to exercise;To provide support and resources with identified psychosocial needs;Provide feedback about the scores to participant    Expected Outcomes Short Term goal: Utilizing psychosocial counselor, staff and physician to assist with identification of specific Stressors or current issues interfering with healing process. Setting desired goal for each stressor or current issue identified.;Long Term Goal: Stressors or current issues are controlled or eliminated.;Short Term goal: Identification and review with participant of any Quality of Life or Depression concerns found by scoring the questionnaire.;Long Term goal: The participant improves quality of Life and PHQ9 Scores as seen by post scores and/or verbalization of changes             Quality of Life Scores:  Scores of 19 and below usually indicate a poorer quality of life in these areas.  A difference of  2-3 points is a clinically meaningful difference.  A difference of 2-3 points in the total score of the Quality of Life Index has been associated with  significant improvement in overall quality of life, self-image, physical symptoms, and general health in studies assessing change in quality of life.  PHQ-9: Review Flowsheet  More data exists      12/14/2022 10/31/2022 04/25/2022 10/05/2020 09/16/2020  Depression screen PHQ 2/9  Decreased Interest 0 2 0 0 0  Down, Depressed, Hopeless 0 0 0 0 0  PHQ - 2 Score 0 2 0 0 0  Altered sleeping 0 1 0 - -  Tired, decreased energy 2 2 1  - -  Change in appetite 0 2 0 - -  Feeling bad or failure about yourself  0  0 0 - -  Trouble concentrating 0 0 0 - -  Moving slowly or fidgety/restless 0 0 1 - -  Suicidal thoughts 0 0 0 - -  PHQ-9 Score 2 7 2  - -  Difficult doing work/chores Not difficult at all - - - -   Interpretation of Total Score  Total Score Depression Severity:  1-4 = Minimal depression, 5-9 = Mild depression, 10-14 = Moderate depression, 15-19 = Moderately severe depression, 20-27 = Severe depression   Psychosocial Evaluation and Intervention:  Psychosocial Evaluation - 10/12/22 0915       Psychosocial Evaluation & Interventions   Interventions Encouraged to exercise with the program and follow exercise prescription;Relaxation education;Stress management education    Comments Patient reports no issues with their current mental states, sleep, stress, depression or anxiety. Will follow up with patient in a few weeks for any changes.    Expected Outcomes Short: Exercise regularly to support mental health and notify staff of any changes. Long: maintain mental health and well being through teaching of rehab or prescribed medications independently.    Continue Psychosocial Services  Follow up required by staff             Psychosocial Re-Evaluation:  Psychosocial Re-Evaluation     Row Name 12/14/22 1122             Psychosocial Re-Evaluation   Current issues with None Identified       Comments Reviewed patient health questionnaire (PHQ-9) with patient for follow up. Previously,  patients score indicated signs/symptoms of depression.  Reviewed to see if patient is improving symptom wise while in program.  Score improved and patient states that it is because he has been exercising more.       Expected Outcomes Short: Continue to attend [facility] regularly for regular exercise and social engagement. Long: Continue to improve symptoms and manage a positive mental state.       Interventions Encouraged to attend Pulmonary Rehabilitation for the exercise       Continue Psychosocial Services  Follow up required by staff                Psychosocial Discharge (Final Psychosocial Re-Evaluation):  Psychosocial Re-Evaluation - 12/14/22 1122       Psychosocial Re-Evaluation   Current issues with None Identified    Comments Reviewed patient health questionnaire (PHQ-9) with patient for follow up. Previously, patients score indicated signs/symptoms of depression.  Reviewed to see if patient is improving symptom wise while in program.  Score improved and patient states that it is because he has been exercising more.    Expected Outcomes Short: Continue to attend [facility] regularly for regular exercise and social engagement. Long: Continue to improve symptoms and manage a positive mental state.    Interventions Encouraged to attend Pulmonary Rehabilitation for the exercise    Continue Psychosocial Services  Follow up required by staff             Education: Education Goals: Education classes will be provided on a weekly basis, covering required topics. Participant will state understanding/return demonstration of topics presented.  Learning Barriers/Preferences:  Learning Barriers/Preferences - 10/12/22 0914       Learning Barriers/Preferences   Learning Barriers None    Learning Preferences None             General Pulmonary Education Topics:  Infection Prevention: - Provides verbal and written material to individual with discussion of infection control  including proper hand washing and proper  equipment cleaning during exercise session. Flowsheet Row Pulmonary Rehab from 10/31/2022 in [facility]  Date 10/31/22  Educator MB  Instruction Review Code 1- Verbalizes Understanding       Falls Prevention: - Provides verbal and written material to individual with discussion of falls prevention and safety. Flowsheet Row Pulmonary Rehab from 10/31/2022 in [facility]monary [facility]  Date 10/31/22  Educator MB  Instruction Review Code 1- Verbalizes Understanding       Chronic Lung Disease Review: - Group verbal instruction with posters, models, PowerPoint presentations and videos,  to review new updates, new respiratory medications, new advancements in procedures and treatments. Providing information on websites and 800 numbers for continued self-education. Includes information about supplement oxygen , available portable oxygen  systems, continuous and intermittent flow rates, oxygen  safety, concentrators, and Medicare reimbursement for oxygen . Explanation of Pulmonary Drugs, including class, frequency, complications, importance of spacers, rinsing mouth after steroid MDI's, and proper cleaning methods for nebulizers. Review of basic lung anatomy and physiology related to function, structure, and complications of lung disease. Review of risk factors. Discussion about methods for diagnosing sleep apnea and types of masks and machines for OSA. Includes a review of the use of types of environmental controls: home humidity, furnaces, filters, dust mite/pet prevention, HEPA vacuums. Discussion about weather changes, air quality and the benefits of nasal washing. Instruction on Warning signs, infection symptoms, calling MD promptly, preventive modes, and value of vaccinations. Review of effective airway clearance, coughing and/or vibration techniques. Emphasizing that all should Create an Action Plan. Written material given at  graduation.   AED/CPR: - Group verbal and written instruction with the use of models to demonstrate the basic use of the AED with the basic ABC's of resuscitation.    Anatomy and Cardiac Procedures: - Group verbal and visual presentation and models provide information about basic cardiac anatomy and function. Reviews the testing methods done to diagnose heart disease and the outcomes of the test results. Describes the treatment choices: Medical Management, Angioplasty, or Coronary Bypass Surgery for treating various heart conditions including Myocardial Infarction, Angina, Valve Disease, and Cardiac Arrhythmias.  Written material given at graduation.   Medication Safety: - Group verbal and visual instruction to review commonly prescribed medications for heart and lung disease. Reviews the medication, class of the drug, and side effects. Includes the steps to properly store meds and maintain the prescription regimen.  Written material given at graduation.   Other: -Provides group and verbal instruction on various topics (see comments)   Knowledge Questionnaire Score:    Core Components/Risk Factors/Patient Goals at Admission:  Personal Goals and Risk Factors at Admission - 10/31/22 1429       Core Components/Risk Factors/Patient Goals on Admission    Weight Management Yes;Weight Maintenance    Intervention Weight Management: Develop a combined nutrition and exercise program designed to reach desired caloric intake, while maintaining appropriate intake of nutrient and fiber, sodium and fats, and appropriate energy expenditure required for the weight goal.;Weight Management: Provide education and appropriate resources to help participant work on and attain dietary goals.;Weight Management/Obesity: Establish reasonable short term and long term weight goals.    Admit Weight 185 lb 1.6 oz (84 kg)    Goal Weight: Short Term 185 lb 1.6 oz (84 kg)    Goal Weight: Long Term 185 lb 1.6 oz (84  kg)    Expected Outcomes Short Term: Continue to assess and modify interventions until short term weight is achieved;Long Term: Adherence to nutrition  and physical activity/exercise program aimed toward attainment of established weight goal;Weight Maintenance: Understanding of the daily nutrition guidelines, which includes 25-35% calories from fat, 7% or less cal from saturated fats, less than 200mg  cholesterol, less than 1.5gm of sodium, & 5 or more servings of fruits and vegetables daily;Understanding recommendations for meals to include 15-35% energy as protein, 25-35% energy from fat, 35-60% energy from carbohydrates, less than 200mg  of dietary cholesterol, 20-35 gm of total fiber daily;Understanding of distribution of calorie intake throughout the day with the consumption of 4-5 meals/snacks    Improve shortness of breath with ADL's Yes    Intervention Provide education, individualized exercise plan and daily activity instruction to help decrease symptoms of SOB with activities of daily living.    Expected Outcomes Short Term: Improve cardiorespiratory fitness to achieve a reduction of symptoms when performing ADLs;Long Term: Be able to perform more ADLs without symptoms or delay the onset of symptoms    Heart Failure Yes    Intervention Provide a combined exercise and nutrition program that is supplemented with education, support and counseling about heart failure. Directed toward relieving symptoms such as shortness of breath, decreased exercise tolerance, and extremity edema.    Expected Outcomes Improve functional capacity of life;Short term: Attendance in program 2-3 days a week with increased exercise capacity. Reported lower sodium intake. Reported increased fruit and vegetable intake. Reports medication compliance.;Short term: Daily weights obtained and reported for increase. Utilizing diuretic protocols set by physician.;Long term: Adoption of self-care skills and reduction of barriers for early  signs and symptoms recognition and intervention leading to self-care maintenance.    Hypertension Yes    Intervention Provide education on lifestyle modifcations including regular physical activity/exercise, weight management, moderate sodium restriction and increased consumption of fresh fruit, vegetables, and low fat dairy, alcohol moderation, and smoking cessation.;Monitor prescription use compliance.    Expected Outcomes Short Term: Continued assessment and intervention until BP is < 140/82mm HG in hypertensive participants. < 130/82mm HG in hypertensive participants with diabetes, heart failure or chronic kidney disease.;Long Term: Maintenance of blood pressure at goal levels.             Education:Diabetes - Individual verbal and written instruction to review signs/symptoms of diabetes, desired ranges of glucose level fasting, after meals and with exercise. Acknowledge that pre and post exercise glucose checks will be done for 3 sessions at entry of program.   Know Your Numbers and Heart Failure: - Group verbal and visual instruction to discuss disease risk factors for cardiac and pulmonary disease and treatment options.  Reviews associated critical values for Overweight/Obesity, Hypertension, Cholesterol, and Diabetes.  Discusses basics of heart failure: signs/symptoms and treatments.  Introduces Heart Failure Zone chart for action plan for heart failure.  Written material given at graduation.   Core Components/Risk Factors/Patient Goals Review:   Goals and Risk Factor Review     Row Name 12/14/22 1116             Core Components/Risk Factors/Patient Goals Review   Personal Goals Review Improve shortness of breath with ADL's       Review Spoke to patient about their shortness of breath and what they can do to improve. Patient has been informed of breathing techniques when starting the program. Patient is informed to tell staff if they have had any med changes and that certain  meds they are taking or not taking can be causing shortness of breath.       Expected Outcomes Short: Attend  [facility] regularly to improve shortness of breath with ADL's. Long: maintain independence with ADL's                Core Components/Risk Factors/Patient Goals at Discharge (Final Review):   Goals and Risk Factor Review - 12/14/22 1116       Core Components/Risk Factors/Patient Goals Review   Personal Goals Review Improve shortness of breath with ADL's    Review Spoke to patient about their shortness of breath and what they can do to improve. Patient has been informed of breathing techniques when starting the program. Patient is informed to tell staff if they have had any med changes and that certain meds they are taking or not taking can be causing shortness of breath.    Expected Outcomes Short: Attend [facility] regularly to improve shortness of breath with ADL's. Long: maintain independence with ADL's             ITP Comments:  ITP Comments     Row Name 10/12/22 0911 10/31/22 1413 11/02/22 1134 11/15/22 1239 12/13/22 0855   ITP Comments Virtual Visit completed. Patient informed on EP and RD appointment and 6 Minute walk test. Patient also informed of patient health questionnaires on My Chart. Patient Verbalizes understanding. Visit diagnosis can be found in [facility] 10/31/2022. Completed and gym orientation. Initial ITP created and sent for review to Dr. Fuad Aleskerov, Medical Director. First full day of exercise!  Patient was oriented to gym and equipment including functions, settings, policies, and procedures.  Patient's individual exercise prescription and treatment plan were reviewed.  All starting workloads were established based on the results of the 6 minute walk test done at initial orientation visit.  The plan for exercise progression was also introduced and progression will be customized based on patient's performance and goals. 30 Day review completed. Medical  Director ITP review done, changes made as directed, and signed approval by Medical Director.    new to program 30 Day review completed. Medical Director ITP review done, changes made as directed, and signed approval by Medical Director.    Row Name 01/03/23 0933 01/16/23 1149 01/31/23 1148 02/08/23 1309 02/12/23 1338   ITP Comments 30 Day review completed. Medical Director ITP review done, changes made as directed, and signed approval by Medical Director. Returning after being out for medical reason 30 Day review completed. Medical Director ITP review done, changes made as directed, and signed approval by Medical Director.   remains out for medical reason Attempted to call pt to check on him. Per chart he was discharged from the hospital 12/20 for weakness. No answer at this time. Lmtcb x 1. Spoke to [place]. 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 He is going today to have it checked. This has caused him to not feel well and unable to attend rehab. He said he will plan to resume this week as scheduled 1/2. Advised that he call to let us  know if unable to make it. Pt voiced understanding.    Row Name 02/15/23 1517           ITP Comments Spoke to [place]. States his fistula was repaired, however, he is still not feeling well with no energy and they do not know what is causing his symptoms. He is under current workup to determine cause of weakness and is unable to return to rehab at this time. [place] is aware that if he is able to return in the future then his doctor may  send a new referral. Will discharge from rehab at this time.                Comments: Discharge ITP

## 2023-02-15 NOTE — Progress Notes (Signed)
 Discharge Summary:  Louis [name]  (DOB: [birth year])  [name] discharged early from pulmonary rehab due to unable to make it to class at this time. He completed 14/36 sessions.    6 Minute Walk     Row Name 10/31/22 1413         6 Minute Walk   Phase Initial     Distance 1380 feet     Walk Time 6 minutes     # of Rest Breaks 0     MPH 2.6     METS 4.34     RPE 11     Perceived Dyspnea  0     VO2 Peak 15.19     Symptoms Yes (comment)     Comments Blood Pressure was high today, no symptoms from it, but waited for it to go down prior to starting     Resting HR 57 bpm     Resting BP 160/100     Resting Oxygen  Saturation  94 %     Exercise Oxygen  Saturation  during 6 min walk 77 %     Max Ex. HR 86 bpm     Max Ex. BP 218/110     2 Minute Post BP 190/110       Interval HR   1 Minute HR 65     2 Minute HR 76     3 Minute HR 81     4 Minute HR 81     5 Minute HR 82     6 Minute HR 86     Interval Heart Rate? Yes       Interval Oxygen    Interval Oxygen ? Yes     Baseline Oxygen  Saturation % 94 %     1 Minute Oxygen  Saturation % 90 %     1 Minute Liters of Oxygen  0 L     2 Minute Oxygen  Saturation % 85 %     2 Minute Liters of Oxygen  0 L     3 Minute Oxygen  Saturation % 83 %     3 Minute Liters of Oxygen  0 L     4 Minute Oxygen  Saturation % 81 %     4 Minute Liters of Oxygen  0 L     5 Minute Oxygen  Saturation % 77 %     5 Minute Liters of Oxygen  0 L     6 Minute Oxygen  Saturation % 84 %     6 Minute Liters of Oxygen  0 L     2 Minute Post Oxygen  Saturation % 94 %     2 Minute Post Liters of Oxygen  0 L

## 2023-02-17 MED ORDER — DOXYCYCLINE HYCLATE 100 MG CAPSULE
ORAL_CAPSULE | Freq: Two times a day (BID) | ORAL | 11 refills | 30.00 days | Status: CP
Start: 2023-02-17 — End: ?

## 2023-03-08 ENCOUNTER — Encounter (INDEPENDENT_AMBULATORY_CARE_PROVIDER_SITE_OTHER): Payer: Self-pay

## 2023-05-07 DIAGNOSIS — T82858A Stenosis of vascular prosthetic devices, implants and grafts, initial encounter: Principal | ICD-10-CM

## 2023-05-08 ENCOUNTER — Inpatient Hospital Stay: Admit: 2023-05-08 | Discharge: 2023-05-09 | Payer: PRIVATE HEALTH INSURANCE

## 2023-05-15 ENCOUNTER — Ambulatory Visit: Admit: 2023-05-15 | Discharge: 2023-05-16 | Attending: Medical | Primary: Medical

## 2023-05-15 DIAGNOSIS — L709 Acne, unspecified: Principal | ICD-10-CM

## 2023-05-15 MED ORDER — TRETINOIN 0.025 % TOPICAL CREAM
4 refills | 0 days | Status: CN
Start: 2023-05-15 — End: ?

## 2023-05-15 MED ORDER — TRETINOIN 0.1 % TOPICAL CREAM
11 refills | 0 days | Status: CP
Start: 2023-05-15 — End: ?

## 2023-06-08 ENCOUNTER — Ambulatory Visit (HOSPITAL_BASED_OUTPATIENT_CLINIC_OR_DEPARTMENT_OTHER): Payer: Medicare HMO | Admitting: Cardiovascular Disease

## 2023-06-12 DIAGNOSIS — T82590S Other mechanical complication of surgically created arteriovenous fistula, sequela: Principal | ICD-10-CM

## 2023-06-19 ENCOUNTER — Inpatient Hospital Stay: Admit: 2023-06-19 | Discharge: 2023-06-20 | Payer: PRIVATE HEALTH INSURANCE

## 2023-06-27 DIAGNOSIS — N186 End stage renal disease: Principal | ICD-10-CM

## 2023-07-03 ENCOUNTER — Encounter (INDEPENDENT_AMBULATORY_CARE_PROVIDER_SITE_OTHER): Payer: Self-pay

## 2023-08-10 DIAGNOSIS — J432 Centrilobular emphysema: Principal | ICD-10-CM

## 2023-08-14 DIAGNOSIS — J432 Centrilobular emphysema: Principal | ICD-10-CM

## 2023-08-14 DIAGNOSIS — L409 Psoriasis, unspecified: Principal | ICD-10-CM

## 2023-08-14 DIAGNOSIS — L709 Acne, unspecified: Principal | ICD-10-CM

## 2023-08-14 DIAGNOSIS — L739 Follicular disorder, unspecified: Principal | ICD-10-CM

## 2023-08-14 MED ORDER — TAZAROTENE 0.1 % TOPICAL CREAM
Freq: Every evening | TOPICAL | 0 refills | 0.00000 days | Status: CP
Start: 2023-08-14 — End: 2024-08-13

## 2023-08-14 MED ORDER — CLINDAMYCIN PHOSPHATE 1 % TOPICAL SWAB
Freq: Two times a day (BID) | TOPICAL | 6 refills | 0.00000 days | Status: CP
Start: 2023-08-14 — End: 2023-09-14

## 2023-08-14 MED ORDER — DOXYCYCLINE HYCLATE 100 MG CAPSULE
ORAL_CAPSULE | Freq: Two times a day (BID) | ORAL | 2 refills | 30.00000 days | Status: CP
Start: 2023-08-14 — End: ?

## 2023-08-30 DIAGNOSIS — J432 Centrilobular emphysema: Principal | ICD-10-CM

## 2023-08-30 DIAGNOSIS — Z992 Dependence on renal dialysis: Principal | ICD-10-CM

## 2023-08-30 DIAGNOSIS — N186 End stage renal disease: Principal | ICD-10-CM

## 2023-09-05 DIAGNOSIS — I2721 Secondary pulmonary arterial hypertension: Principal | ICD-10-CM

## 2023-09-07 ENCOUNTER — Inpatient Hospital Stay: Admit: 2023-09-07 | Discharge: 2023-09-07 | Payer: Medicaid (Managed Care)

## 2023-09-07 ENCOUNTER — Ambulatory Visit: Admit: 2023-09-07 | Discharge: 2023-09-07 | Payer: Medicaid (Managed Care)

## 2023-09-07 DIAGNOSIS — J432 Centrilobular emphysema: Principal | ICD-10-CM

## 2023-09-07 DIAGNOSIS — G4739 Other sleep apnea: Principal | ICD-10-CM

## 2023-09-10 ENCOUNTER — Inpatient Hospital Stay: Admit: 2023-09-10 | Discharge: 2023-09-11 | Payer: Medicaid (Managed Care)

## 2023-09-12 DIAGNOSIS — K219 Gastro-esophageal reflux disease without esophagitis: Principal | ICD-10-CM

## 2023-09-12 MED ORDER — OMEPRAZOLE 20 MG TABLET,DELAYED RELEASE
ORAL_TABLET | Freq: Every day | ORAL | 3 refills | 100.00000 days | Status: CP
Start: 2023-09-12 — End: 2024-10-16

## 2023-09-13 ENCOUNTER — Ambulatory Visit: Admit: 2023-09-13 | Discharge: 2023-09-14 | Payer: Medicaid (Managed Care) | Attending: Surgery | Primary: Surgery

## 2023-09-13 ENCOUNTER — Inpatient Hospital Stay: Admit: 2023-09-13 | Discharge: 2023-09-14 | Payer: MEDICAID

## 2023-09-19 DIAGNOSIS — Z992 Dependence on renal dialysis: Principal | ICD-10-CM

## 2023-09-19 DIAGNOSIS — N186 End stage renal disease: Principal | ICD-10-CM

## 2023-09-28 ENCOUNTER — Encounter: Attending: Internal Medicine | Admitting: *Deleted

## 2023-09-28 DIAGNOSIS — J449 Chronic obstructive pulmonary disease, unspecified: Secondary | ICD-10-CM | POA: Insufficient documentation

## 2023-09-28 DIAGNOSIS — J439 Emphysema, unspecified: Secondary | ICD-10-CM

## 2023-09-28 NOTE — Progress Notes (Signed)
 Initial phone call completed. Diagnosis can be found in [facility] 7/25. EP Orientation scheduled for Wednesday 8/20 at 9am.

## 2023-10-03 ENCOUNTER — Ambulatory Visit

## 2023-10-03 VITALS — Ht 73.1 in | Wt 194.8 lb

## 2023-10-03 DIAGNOSIS — J449 Chronic obstructive pulmonary disease, unspecified: Secondary | ICD-10-CM | POA: Diagnosis present

## 2023-10-03 DIAGNOSIS — J439 Emphysema, unspecified: Secondary | ICD-10-CM

## 2023-10-03 NOTE — Progress Notes (Signed)
 Pulmonary Individual Treatment Plan  Patient Details  Name: Louis [name] MRN: [id number] Date of Birth: 1960/12/03 Referring Provider:   Flowsheet Row Pulmonary Rehab from 10/03/2023 in [facility] Cardiac and Pulmonary Rehab  Referring Provider Dr. Elna Else    Initial Encounter Date:  Flowsheet Row Pulmonary Rehab from 10/03/2023 in [facility] Cardiac and Pulmonary Rehab  Date 10/03/23    Visit Diagnosis: Pulmonary emphysema, unspecified emphysema type (HCC)  Patient's Home Medications on Admission:  Current Outpatient Medications:    AURYXIA  1 GM 210 MG(Fe) tablet, Take 1 tablet by mouth 4 (four) times daily -  before meals and at bedtime. (Patient not taking: Reported on 10/12/2022), Disp: , Rfl:    B Complex-C-Folic Acid  (RENA-VITE RX) 1 MG TABS, Take 1 tablet by mouth daily. (Patient not taking: Reported on 04/25/2022), Disp: , Rfl:    BIKTARVY 50-200-25 MG TABS tablet, Take 1 tablet by mouth daily., Disp: , Rfl:    calcium  carbonate (TUMS) 500 MG chewable tablet, Chew by mouth. (Patient not taking: Reported on 09/28/2023), Disp: , Rfl:    carvedilol  (COREG ) 12.5 MG tablet, Take by mouth. (Patient not taking: Reported on 09/28/2023), Disp: , Rfl:    clindamycin (CLEOCIN T) 1 % SWAB, Apply topically., Disp: , Rfl:    clobetasol ointment (TEMOVATE) 0.05 %, Apply topically 2 (two) times daily as needed. (Patient not taking: Reported on 10/12/2022), Disp: , Rfl:    DESCOVY  200-25 MG tablet, Take 1 tablet by mouth daily. (Patient not taking: Reported on 09/28/2023), Disp: , Rfl:    diclofenac  Sodium (VOLTAREN ) 1 % GEL, Apply 4 g topically 4 (four) times daily. (Patient not taking: Reported on 04/25/2022), Disp: 100 g, Rfl: 1   doxycycline  (VIBRAMYCIN ) 100 MG capsule, Take 100 mg by mouth 2 (two) times daily., Disp: , Rfl:    methocarbamol  (ROBAXIN ) 500 MG tablet, TAKE 1 TABLET (500 MG TOTAL) BY MOUTH 2 (TWO) TIMES DAILY AS NEEDED. FOR PAIN (Patient not taking: Reported on 09/28/2023), Disp: 60  tablet, Rfl: 0   midodrine (PROAMATINE) 5 MG tablet, Take 5 mg by mouth., Disp: , Rfl:    minoxidil (LONITEN) 2.5 MG tablet, Take 2.5 mg by mouth daily. (Patient not taking: Reported on 04/25/2022), Disp: , Rfl:    Omeprazole 20 MG TBEC, Take 20 mg by mouth., Disp: , Rfl:    ondansetron  (ZOFRAN -ODT) 4 MG disintegrating tablet, Take 4 mg by mouth every 8 (eight) hours as needed. (Patient not taking: Reported on 10/12/2022), Disp: , Rfl:    predniSONE  (DELTASONE ) 10 MG tablet, Take 1 tablet (10 mg total) by mouth 2 (two) times daily as needed. Prn gout attack for 2 days, Disp: 10 tablet, Rfl: 0   prochlorperazine  (COMPAZINE ) 5 MG tablet, Take 1 tablet (5 mg total) by mouth every 6 (six) hours as needed for nausea or vomiting (hiccups)., Disp: 15 tablet, Rfl: 0   TIVICAY  50 MG tablet, Take 1 tablet by mouth daily. (Patient not taking: Reported on 04/25/2022), Disp: , Rfl:    tretinoin (RETIN-A) 0.1 % cream, Apply thin layer to shoulders for acne , starting 2 nights per week and advancing to nightly  as tolerated.Okay to mix with moisturizer or skip a few nights as needed for dryness., Disp: , Rfl:    valACYclovir  (VALTREX ) 500 MG tablet, Take 500 mg by mouth daily as needed (for flare ups).  (Patient taking differently: Take 500 mg by mouth every other day.), Disp: , Rfl:    valACYclovir  (VALTREX ) 500 MG tablet, Take  by mouth. (Patient not taking: Reported on 10/12/2022), Disp: , Rfl:   Past Medical History: Past Medical History:  Diagnosis Date   Chronic kidney disease    esrd   Dermatophytosis of foot 06/23/2004   Environmental allergies    ESRD (end stage renal disease) on dialysis (HCC)    Gout    HIV infection (HCC) 12/1995   Hypertension    Seborrhea 06/23/2004    Tobacco Use: Social History   Tobacco Use  Smoking Status Never  Smokeless Tobacco Never    Labs: Review Flowsheet       Latest Ref Rng & Units 06/09/2010 08/09/2011 08/23/2016  Labs for ITP Cardiac and Pulmonary Rehab   Cholestrol 0 - 200 mg/dL 813  820  -  LDL (calc) 0 - 99 mg/dL 882  892  -  HDL-C >60 mg/dL 55  54  -  Trlycerides <150 mg/dL 69  89  -  Hemoglobin J8r 4.8 - 5.6 % - - 5.1      Pulmonary Assessment Scores:  Pulmonary Assessment Scores     Row Name 10/03/23 1024         ADL UCSD   ADL Phase Entry       CAT Score   CAT Score 13       mMRC Score   mMRC Score 0        UCSD: Self-administered rating of dyspnea associated with activities of daily living (ADLs) 6-point scale (0 = not at all to 5 = maximal or unable to do because of breathlessness)  Scoring Scores range from 0 to 120.  Minimally important difference is 5 units  CAT: CAT can identify the health impairment of COPD patients and is better correlated with disease progression.  CAT has a scoring range of zero to 40. The CAT score is classified into four groups of low (less than 10), medium (10 - 20), high (21-30) and very high (31-40) based on the impact level of disease on health status. A CAT score over 10 suggests significant symptoms.  A worsening CAT score could be explained by an exacerbation, poor medication adherence, poor inhaler technique, or progression of COPD or comorbid conditions.  CAT MCID is 2 points  mMRC: mMRC (Modified Medical Research Council) Dyspnea Scale is used to assess the degree of baseline functional disability in patients of respiratory disease due to dyspnea. No minimal important difference is established. A decrease in score of 1 point or greater is considered a positive change.   Pulmonary Function Assessment:   Exercise Target Goals: Exercise Program Goal: Individual exercise prescription set using results from initial 6 min walk test and THRR while considering  patient's activity barriers and safety.   Exercise Prescription Goal: Initial exercise prescription builds to 30-45 minutes a day of aerobic activity, 2-3 days per week.  Home exercise guidelines will be given to patient  during program as part of exercise prescription that the participant will acknowledge.  Education: Aerobic Exercise: - Group verbal and visual presentation on the components of exercise prescription. Introduces F.I.T.T principle from ACSM for exercise prescriptions.  Reviews F.I.T.T. principles of aerobic exercise including progression. Written material provided at class time.   Education: Resistance Exercise: - Group verbal and visual presentation on the components of exercise prescription. Introduces F.I.T.T principle from ACSM for exercise prescriptions  Reviews F.I.T.T. principles of resistance exercise including progression. Written material provided at class time.    Education: Exercise & Equipment Safety: - Individual verbal instruction and demonstration  of equipment use and safety with use of the equipment. Flowsheet Row Pulmonary Rehab from 10/03/2023 in [facility] Cardiac and Pulmonary Rehab  Date 10/03/23  Educator [facility]  Instruction Review Code 1- Verbalizes Understanding    Education: Exercise Physiology & General Exercise Guidelines: - Group verbal and written instruction with models to review the exercise physiology of the cardiovascular system and associated critical values. Provides general exercise guidelines with specific guidelines to those with heart or lung disease.    Education: Flexibility, Balance, Mind/Body Relaxation: - Group verbal and visual presentation with interactive activity on the components of exercise prescription. Introduces F.I.T.T principle from ACSM for exercise prescriptions. Reviews F.I.T.T. principles of flexibility and balance exercise training including progression. Also discusses the mind body connection.  Reviews various relaxation techniques to help reduce and manage stress (i.e. Deep breathing, progressive muscle relaxation, and visualization). Balance handout provided to take home. Written material provided at class time.   Activity Barriers & Risk  Stratification:  Activity Barriers & Cardiac Risk Stratification - 10/03/23 1017       Activity Barriers & Cardiac Risk Stratification   Activity Barriers None          6 Minute Walk:  6 Minute Walk     Row Name 10/03/23 1012         6 Minute Walk   Phase Initial     Distance 1340 feet     Walk Time 6 minutes     # of Rest Breaks 0     MPH 2.53     METS 3.46     RPE 7     Perceived Dyspnea  0     VO2 Peak 12.1     Symptoms No     Resting HR 53 bpm     Resting BP 128/70     Resting Oxygen  Saturation  98 %     Exercise Oxygen  Saturation  during 6 min walk 91 %     Max Ex. HR 71 bpm     Max Ex. BP 142/78     2 Minute Post BP 134/74       Interval HR   1 Minute HR 60     2 Minute HR 71     3 Minute HR 69     4 Minute HR 69     Interval Heart Rate? Yes       Interval Oxygen    Interval Oxygen ? Yes     Baseline Oxygen  Saturation % 98 %     1 Minute Oxygen  Saturation % 95 %     1 Minute Liters of Oxygen  0 L     2 Minute Oxygen  Saturation % 94 %     2 Minute Liters of Oxygen  0 L     3 Minute Oxygen  Saturation % 91 %     3 Minute Liters of Oxygen  0 L     4 Minute Oxygen  Saturation % 91 %     4 Minute Liters of Oxygen  0 L     5 Minute Liters of Oxygen  0 L     6 Minute Liters of Oxygen  0 L     2 Minute Post Liters of Oxygen  0 L       Oxygen  Initial Assessment:  Oxygen  Initial Assessment - 10/03/23 1021       Home Oxygen    Home Oxygen  Device None    Sleep Oxygen  Prescription None    Home Exercise Oxygen  Prescription None  Home Resting Oxygen  Prescription None    Compliance with Home Oxygen  Use Yes      Initial 6 min Walk   Oxygen  Used None      Program Oxygen  Prescription   Program Oxygen  Prescription None      Intervention   Short Term Goals To learn and demonstrate proper pursed lip breathing techniques or other breathing techniques. ;To learn and understand importance of monitoring SPO2 with pulse oximeter and demonstrate accurate use of the pulse  oximeter.;To learn and understand importance of maintaining oxygen  saturations>88%    Long  Term Goals Maintenance of O2 saturations>88%;Exhibits proper breathing techniques, such as pursed lip breathing or other method taught during program session;Compliance with respiratory medication;Verbalizes importance of monitoring SPO2 with pulse oximeter and return demonstration          Oxygen  Re-Evaluation:   Oxygen  Discharge (Final Oxygen  Re-Evaluation):   Initial Exercise Prescription:  Initial Exercise Prescription - 10/03/23 1000       Date of Initial Exercise RX and Referring Provider   Date 10/03/23    Referring Provider Dr. Elna Else      Oxygen    Maintain Oxygen  Saturation 88% or higher      Treadmill   MPH 2.6    Grade 0    Minutes 15    METs 3.46      NuStep   Level 3    SPM 80    Minutes 15    METs 3.46      REL-XR   Level 3    Watts 25    Speed 50    Minutes 15    METs 3.46      Biostep-RELP   Level 3    SPM 80    Minutes 15    METs 3.46      Prescription Details   Duration Progress to 30 minutes of continuous aerobic without signs/symptoms of physical distress      Intensity   THRR 40-80% of Max Heartrate 95-137    Ratings of Perceived Exertion 11-13    Perceived Dyspnea 0-4      Progression   Progression Continue to progress workloads to maintain intensity without signs/symptoms of physical distress.      Resistance Training   Training Prescription Yes    Weight 7lb    Reps 10-15          Perform Capillary Blood Glucose checks as needed.  Exercise Prescription Changes:   Exercise Prescription Changes     Row Name 10/03/23 1000             Response to Exercise   Blood Pressure (Admit) 128/70       Blood Pressure (Exercise) 142/78       Blood Pressure (Exit) 134/74       Heart Rate (Admit) 53 bpm       Heart Rate (Exercise) 71 bpm       Heart Rate (Exit) 69 bpm       Oxygen  Saturation (Admit) 98 %       Oxygen   Saturation (Exercise) 91 %       Oxygen  Saturation (Exit) 91 %       Rating of Perceived Exertion (Exercise) 7       Perceived Dyspnea (Exercise) 0       Symptoms none       Comments results          Exercise Comments:   Exercise Goals and Review:  Exercise Goals     Row Name 10/03/23 1019             Exercise Goals   Increase Physical Activity Yes       Intervention Provide advice, education, support and counseling about physical activity/exercise needs.;Develop an individualized exercise prescription for aerobic and resistive training based on initial evaluation findings, risk stratification, comorbidities and participant's personal goals.       Expected Outcomes Short Term: Attend rehab on a regular basis to increase amount of physical activity.;Long Term: Add in home exercise to make exercise part of routine and to increase amount of physical activity.;Long Term: Exercising regularly at least 3-5 days a week.       Increase Strength and Stamina Yes       Intervention Provide advice, education, support and counseling about physical activity/exercise needs.;Develop an individualized exercise prescription for aerobic and resistive training based on initial evaluation findings, risk stratification, comorbidities and participant's personal goals.       Expected Outcomes Short Term: Increase workloads from initial exercise prescription for resistance, speed, and METs.;Short Term: Perform resistance training exercises routinely during rehab and add in resistance training at home;Long Term: Improve cardiorespiratory fitness, muscular endurance and strength as measured by increased METs and functional capacity ( )       Able to understand and use rate of perceived exertion (RPE) scale Yes       Intervention Provide education and explanation on how to use RPE scale       Expected Outcomes Short Term: Able to use RPE daily in rehab to express subjective intensity level;Long Term:   Able to use RPE to guide intensity level when exercising independently       Able to understand and use Dyspnea scale Yes       Intervention Provide education and explanation on how to use Dyspnea scale       Expected Outcomes Short Term: Able to use Dyspnea scale daily in rehab to express subjective sense of shortness of breath during exertion;Long Term: Able to use Dyspnea scale to guide intensity level when exercising independently       Knowledge and understanding of Target Heart Rate Range (THRR) Yes       Intervention Provide education and explanation of THRR including how the numbers were predicted and where they are located for reference       Expected Outcomes Short Term: Able to state/look up THRR;Short Term: Able to use daily as guideline for intensity in rehab;Long Term: Able to use THRR to govern intensity when exercising independently       Able to check pulse independently Yes       Intervention Provide education and demonstration on how to check pulse in carotid and radial arteries.;Review the importance of being able to check your own pulse for safety during independent exercise       Expected Outcomes Short Term: Able to explain why pulse checking is important during independent exercise;Long Term: Able to check pulse independently and accurately       Understanding of Exercise Prescription Yes       Intervention Provide education, explanation, and written materials on patient's individual exercise prescription       Expected Outcomes Short Term: Able to explain program exercise prescription;Long Term: Able to explain home exercise prescription to exercise independently          Exercise Goals Re-Evaluation :   Discharge Exercise Prescription (Final Exercise Prescription Changes):  Exercise Prescription Changes -  10/03/23 1000       Response to Exercise   Blood Pressure (Admit) 128/70    Blood Pressure (Exercise) 142/78    Blood Pressure (Exit) 134/74    Heart Rate  (Admit) 53 bpm    Heart Rate (Exercise) 71 bpm    Heart Rate (Exit) 69 bpm    Oxygen  Saturation (Admit) 98 %    Oxygen  Saturation (Exercise) 91 %    Oxygen  Saturation (Exit) 91 %    Rating of Perceived Exertion (Exercise) 7    Perceived Dyspnea (Exercise) 0    Symptoms none    Comments results          Nutrition:  Target Goals: Understanding of nutrition guidelines, daily intake of sodium 1500mg , cholesterol 200mg , calories 30% from fat and 7% or less from saturated fats, daily to have 5 or more servings of fruits and vegetables.  Education: Nutrition 1 -Group instruction provided by verbal, written material, interactive activities, discussions, models, and posters to present general guidelines for heart healthy nutrition including macronutrients, label reading, and promoting whole foods over processed counterparts. Education serves as Pensions consultant of discussion of heart healthy eating for all. Written material provided at class time.     Education: Nutrition 2 -Group instruction provided by verbal, written material, interactive activities, discussions, models, and posters to present general guidelines for heart healthy nutrition including sodium, cholesterol, and saturated fat. Providing guidance of habit forming to improve blood pressure, cholesterol, and body weight. Written material provided at class time.     Biometrics:  Pre Biometrics - 10/03/23 1020       Pre Biometrics   Height 6' 1.1 (1.857 m)    Weight 194 lb 12.8 oz (88.4 kg)    Waist Circumference 36 inches    Hip Circumference 39 inches    Waist to Hip Ratio 0.92 %    BMI (Calculated) 25.62    Single Leg Stand 17.5 seconds           Nutrition Therapy Plan and Nutrition Goals:   Nutrition Assessments:  MEDIFICTS Score Key: >=70 Need to make dietary changes  40-70 Heart Healthy Diet <= 40 Therapeutic Level Cholesterol Diet  Flowsheet Row Pulmonary Rehab from 10/03/2023 in [facility] Cardiac and  Pulmonary Rehab  Picture Your Plate Total Score on Admission 44   Picture Your Plate Scores: <59 Unhealthy dietary pattern with much room for improvement. 41-50 Dietary pattern unlikely to meet recommendations for good health and room for improvement. 51-60 More healthful dietary pattern, with some room for improvement.  >60 Healthy dietary pattern, although there may be some specific behaviors that could be improved.   Nutrition Goals Re-Evaluation:   Nutrition Goals Discharge (Final Nutrition Goals Re-Evaluation):   Psychosocial: Target Goals: Acknowledge presence or absence of significant depression and/or stress, maximize coping skills, provide positive support system. Participant is able to verbalize types and ability to use techniques and skills needed for reducing stress and depression.   Education: Stress, Anxiety, and Depression - Group verbal and visual presentation to define topics covered.  Reviews how body is impacted by stress, anxiety, and depression.  Also discusses healthy ways to reduce stress and to treat/manage anxiety and depression.  Written material provided at class time.   Education: Sleep Hygiene -Provides group verbal and written instruction about how sleep can affect your health.  Define sleep hygiene, discuss sleep cycles and impact of sleep habits. Review good sleep hygiene tips.    Initial Review & Psychosocial Screening:  Initial  Psych Review & Screening - 09/28/23 1040       Initial Review   Current issues with Current Stress Concerns    Source of Stress Concerns Chronic Illness      Family Dynamics   Good Support System? Yes      Barriers   Psychosocial barriers to participate in program There are no identifiable barriers or psychosocial needs.      Screening Interventions   Interventions Encouraged to exercise;To provide support and resources with identified psychosocial needs;Provide feedback about the scores to participant    Expected  Outcomes Short Term goal: Utilizing psychosocial counselor, staff and physician to assist with identification of specific Stressors or current issues interfering with healing process. Setting desired goal for each stressor or current issue identified.;Long Term Goal: Stressors or current issues are controlled or eliminated.;Short Term goal: Identification and review with participant of any Quality of Life or Depression concerns found by scoring the questionnaire.;Long Term goal: The participant improves quality of Life and PHQ9 Scores as seen by post scores and/or verbalization of changes          Quality of Life Scores:  Scores of 19 and below usually indicate a poorer quality of life in these areas.  A difference of  2-3 points is a clinically meaningful difference.  A difference of 2-3 points in the total score of the Quality of Life Index has been associated with significant improvement in overall quality of life, self-image, physical symptoms, and general health in studies assessing change in quality of life.  PHQ-9: Review Flowsheet  More data exists      12/14/2022 10/31/2022 04/25/2022 10/05/2020 09/16/2020  Depression screen PHQ 2/9  Decreased Interest 0 2 0 0 0  Down, Depressed, Hopeless 0 0 0 0 0  PHQ - 2 Score 0 2 0 0 0  Altered sleeping 0 1 0 - -  Tired, decreased energy 2 2 1  - -  Change in appetite 0 2 0 - -  Feeling bad or failure about yourself  0 0 0 - -  Trouble concentrating 0 0 0 - -  Moving slowly or fidgety/restless 0 0 1 - -  Suicidal thoughts 0 0 0 - -  PHQ-9 Score 2 7 2  - -  Difficult doing work/chores Not difficult at all - - - -   Interpretation of Total Score  Total Score Depression Severity:  1-4 = Minimal depression, 5-9 = Mild depression, 10-14 = Moderate depression, 15-19 = Moderately severe depression, 20-27 = Severe depression   Psychosocial Evaluation and Intervention:  Psychosocial Evaluation - 09/28/23 1056       Psychosocial Evaluation &  Interventions   Interventions Encouraged to exercise with the program and follow exercise prescription;Stress management education;Relaxation education    Comments Mr. Hlavacek is coming to pulmonary rehab. He mentions that his transplant team wants him to complete the program in preparation for a kidney transplant work up. 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 He reports his biggest stress concern is how he is going to do exercising given his long covid symptoms versus renal symptoms. He has done the program before, so he knows what to expect. He does state his stress level has been higher thinking about attending the program, but he is wanting to attend to help prepare him for transplant work up.    Expected Outcomes Short: attend pulmonary rehab for education and exercise Long: develop and maintain positive self care habits.  Continue Psychosocial Services  Follow up required by staff          Psychosocial Re-Evaluation:   Psychosocial Discharge (Final Psychosocial Re-Evaluation):   Education: Education Goals: Education classes will be provided on a weekly basis, covering required topics. Participant will state understanding/return demonstration of topics presented.  Learning Barriers/Preferences:  Learning Barriers/Preferences - 09/28/23 1039       Learning Barriers/Preferences   Learning Barriers Exercise Concerns    Learning Preferences None          General Pulmonary Education Topics:  Infection Prevention: - Provides verbal and written material to individual with discussion of infection control including proper hand washing and proper equipment cleaning during exercise session. Flowsheet Row Pulmonary Rehab from 10/03/2023 in [facility] Cardiac and Pulmonary Rehab  Date 10/03/23  Educator [facility]  Instruction Review Code 1- Verbalizes Understanding    Falls Prevention: - Provides verbal and written material to individual with discussion of falls  prevention and safety. Flowsheet Row Pulmonary Rehab from 10/03/2023 in [facility] Cardiac and Pulmonary Rehab  Date 10/03/23  Educator [facility]  Instruction Review Code 1- Verbalizes Understanding    Chronic Lung Disease Review: - Group verbal instruction with posters, models, PowerPoint presentations and videos,  to review new updates, new respiratory medications, new advancements in procedures and treatments. Providing information on websites and 800 numbers for continued self-education. Includes information about supplement oxygen , available portable oxygen  systems, continuous and intermittent flow rates, oxygen  safety, concentrators, and Medicare reimbursement for oxygen . Explanation of Pulmonary Drugs, including class, frequency, complications, importance of spacers, rinsing mouth after steroid MDI's, and proper cleaning methods for nebulizers. Review of basic lung anatomy and physiology related to function, structure, and complications of lung disease. Review of risk factors. Discussion about methods for diagnosing sleep apnea and types of masks and machines for OSA. Includes a review of the use of types of environmental controls: home humidity, furnaces, filters, dust mite/pet prevention, HEPA vacuums. Discussion about weather changes, air quality and the benefits of nasal washing. Instruction on Warning signs, infection symptoms, calling MD promptly, preventive modes, and value of vaccinations. Review of effective airway clearance, coughing and/or vibration techniques. Emphasizing that all should Create an Action Plan. Written material provided at class time. Flowsheet Row Pulmonary Rehab from 10/03/2023 in [facility] Cardiac and Pulmonary Rehab  Education need identified 10/03/23    AED/CPR: - Group verbal and written instruction with the use of models to demonstrate the basic use of the AED with the basic ABC's of resuscitation.    Tests and Procedures:  - Group verbal and visual presentation and models  provide information about basic cardiac anatomy and function. Reviews the testing methods done to diagnose heart disease and the outcomes of the test results. Describes the treatment choices: Medical Management, Angioplasty, or Coronary Bypass Surgery for treating various heart conditions including Myocardial Infarction, Angina, Valve Disease, and Cardiac Arrhythmias.  Written material provided at class time.   Medication Safety: - Group verbal and visual instruction to review commonly prescribed medications for heart and lung disease. Reviews the medication, class of the drug, and side effects. Includes the steps to properly store meds and maintain the prescription regimen.  Written material given at graduation.   Other: -Provides group and verbal instruction on various topics (see comments)   Knowledge Questionnaire Score:    Core Components/Risk Factors/Patient Goals at Admission:  Personal Goals and Risk Factors at Admission - 09/28/23 1039       Core Components/Risk Factors/Patient Goals on Admission  Improve shortness of breath with ADL's Yes    Intervention Provide education, individualized exercise plan and daily activity instruction to help decrease symptoms of SOB with activities of daily living.    Expected Outcomes Short Term: Improve cardiorespiratory fitness to achieve a reduction of symptoms when performing ADLs;Long Term: Be able to perform more ADLs without symptoms or delay the onset of symptoms    Heart Failure Yes    Intervention Provide a combined exercise and nutrition program that is supplemented with education, support and counseling about heart failure. Directed toward relieving symptoms such as shortness of breath, decreased exercise tolerance, and extremity edema.    Expected Outcomes Improve functional capacity of life;Short term: Attendance in program 2-3 days a week with increased exercise capacity. Reported lower sodium intake. Reported increased fruit and  vegetable intake. Reports medication compliance.;Short term: Daily weights obtained and reported for increase. Utilizing diuretic protocols set by physician.;Long term: Adoption of self-care skills and reduction of barriers for early signs and symptoms recognition and intervention leading to self-care maintenance.    Hypertension Yes    Intervention Provide education on lifestyle modifcations including regular physical activity/exercise, weight management, moderate sodium restriction and increased consumption of fresh fruit, vegetables, and low fat dairy, alcohol moderation, and smoking cessation.;Monitor prescription use compliance.    Expected Outcomes Short Term: Continued assessment and intervention until BP is < 140/64mm HG in hypertensive participants. < 130/54mm HG in hypertensive participants with diabetes, heart failure or chronic kidney disease.;Long Term: Maintenance of blood pressure at goal levels.          Education:Diabetes - Individual verbal and written instruction to review signs/symptoms of diabetes, desired ranges of glucose level fasting, after meals and with exercise. Acknowledge that pre and post exercise glucose checks will be done for 3 sessions at entry of program.   Know Your Numbers and Heart Failure: - Group verbal and visual instruction to discuss disease risk factors for cardiac and pulmonary disease and treatment options.  Reviews associated critical values for Overweight/Obesity, Hypertension, Cholesterol, and Diabetes.  Discusses basics of heart failure: signs/symptoms and treatments.  Introduces Heart Failure Zone chart for action plan for heart failure. Written material provided at class time.   Core Components/Risk Factors/Patient Goals Review:    Core Components/Risk Factors/Patient Goals at Discharge (Final Review):    ITP Comments:  ITP Comments     Row Name 09/28/23 1038 10/03/23 1009         ITP Comments Initial phone call completed. Diagnosis  can be found in [facility] 7/25. EP Orientation scheduled for Wednesday 8/20 at 9am. Completed and gym orientation for pulmonary rehab. Initial ITP created and sent for review to Dr. Faud Aleskerov, Medical Director.         Comments: Initial ITP

## 2023-10-03 NOTE — Patient Instructions (Signed)
 Patient Instructions  Patient Details  Name: Louis [name] MRN: [id number] Date of Birth: [birth year] Referring Provider:  Pricilla Elna LABOR, MD  Below are your personal goals for exercise, nutrition, and risk factors. Our goal is to help you stay on track towards obtaining and maintaining these goals. We will be discussing your progress on these goals with you throughout the program.  Initial Exercise Prescription:  Initial Exercise Prescription - 10/03/23 1000       Date of Initial Exercise RX and Referring Provider   Date 10/03/23    Referring Provider Dr. Elna Pricilla      Oxygen    Maintain Oxygen  Saturation 88% or higher      Treadmill   MPH 2.6    Grade 0    Minutes 15    METs 3.46      NuStep   Level 3    SPM 80    Minutes 15    METs 3.46      REL-XR   Level 3    Watts 25    Speed 50    Minutes 15    METs 3.46      Biostep-RELP   Level 3    SPM 80    Minutes 15    METs 3.46      Prescription Details   Duration Progress to 30 minutes of continuous aerobic without signs/symptoms of physical distress      Intensity   THRR 40-80% of Max Heartrate 95-137    Ratings of Perceived Exertion 11-13    Perceived Dyspnea 0-4      Progression   Progression Continue to progress workloads to maintain intensity without signs/symptoms of physical distress.      Resistance Training   Training Prescription Yes    Weight 7lb    Reps 10-15          Exercise Goals: Frequency: Be able to perform aerobic exercise two to three times per week in program working toward 2-5 days per week of home exercise.  Intensity: Work with a perceived exertion of 11 (fairly light) - 15 (hard) while following your exercise prescription.  We will make changes to your prescription with you as you progress through the program.   Duration: Be able to do 30 to 45 minutes of continuous aerobic exercise in addition to a 5 minute warm-up and a 5 minute cool-down routine.   Nutrition  Goals: Your personal nutrition goals will be established when you do your nutrition analysis with the dietician.  The following are general nutrition guidelines to follow: Cholesterol < 200mg /day Sodium < 1500mg /day Fiber: Men over 50 yrs - 30 grams per day  Personal Goals:  Personal Goals and Risk Factors at Admission - 09/28/23 1039       Core Components/Risk Factors/Patient Goals on Admission   Improve shortness of breath with ADL's Yes    Intervention Provide education, individualized exercise plan and daily activity instruction to help decrease symptoms of SOB with activities of daily living.    Expected Outcomes Short Term: Improve cardiorespiratory fitness to achieve a reduction of symptoms when performing ADLs;Long Term: Be able to perform more ADLs without symptoms or delay the onset of symptoms    Heart Failure Yes    Intervention Provide a combined exercise and nutrition program that is supplemented with education, support and counseling about heart failure. Directed toward relieving symptoms such as shortness of breath, decreased exercise tolerance, and extremity edema.    Expected Outcomes Improve functional  capacity of life;Short term: Attendance in program 2-3 days a week with increased exercise capacity. Reported lower sodium intake. Reported increased fruit and vegetable intake. Reports medication compliance.;Short term: Daily weights obtained and reported for increase. Utilizing diuretic protocols set by physician.;Long term: Adoption of self-care skills and reduction of barriers for early signs and symptoms recognition and intervention leading to self-care maintenance.    Hypertension Yes    Intervention Provide education on lifestyle modifcations including regular physical activity/exercise, weight management, moderate sodium restriction and increased consumption of fresh fruit, vegetables, and low fat dairy, alcohol moderation, and smoking cessation.;Monitor prescription use  compliance.    Expected Outcomes Short Term: Continued assessment and intervention until BP is < 140/64mm HG in hypertensive participants. < 130/71mm HG in hypertensive participants with diabetes, heart failure or chronic kidney disease.;Long Term: Maintenance of blood pressure at goal levels.          Exercise Goals and Review:  Exercise Goals     Row Name 10/03/23 1019             Exercise Goals   Increase Physical Activity Yes       Intervention Provide advice, education, support and counseling about physical activity/exercise needs.;Develop an individualized exercise prescription for aerobic and resistive training based on initial evaluation findings, risk stratification, comorbidities and participant's personal goals.       Expected Outcomes Short Term: Attend rehab on a regular basis to increase amount of physical activity.;Long Term: Add in home exercise to make exercise part of routine and to increase amount of physical activity.;Long Term: Exercising regularly at least 3-5 days a week.       Increase Strength and Stamina Yes       Intervention Provide advice, education, support and counseling about physical activity/exercise needs.;Develop an individualized exercise prescription for aerobic and resistive training based on initial evaluation findings, risk stratification, comorbidities and participant's personal goals.       Expected Outcomes Short Term: Increase workloads from initial exercise prescription for resistance, speed, and METs.;Short Term: Perform resistance training exercises routinely during rehab and add in resistance training at home;Long Term: Improve cardiorespiratory fitness, muscular endurance and strength as measured by increased METs and functional capacity ( )       Able to understand and use rate of perceived exertion (RPE) scale Yes       Intervention Provide education and explanation on how to use RPE scale       Expected Outcomes Short Term: Able to use  RPE daily in rehab to express subjective intensity level;Long Term:  Able to use RPE to guide intensity level when exercising independently       Able to understand and use Dyspnea scale Yes       Intervention Provide education and explanation on how to use Dyspnea scale       Expected Outcomes Short Term: Able to use Dyspnea scale daily in rehab to express subjective sense of shortness of breath during exertion;Long Term: Able to use Dyspnea scale to guide intensity level when exercising independently       Knowledge and understanding of Target Heart Rate Range (THRR) Yes       Intervention Provide education and explanation of THRR including how the numbers were predicted and where they are located for reference       Expected Outcomes Short Term: Able to state/look up THRR;Short Term: Able to use daily as guideline for intensity in rehab;Long Term: Able to use THRR to govern intensity  when exercising independently       Able to check pulse independently Yes       Intervention Provide education and demonstration on how to check pulse in carotid and radial arteries.;Review the importance of being able to check your own pulse for safety during independent exercise       Expected Outcomes Short Term: Able to explain why pulse checking is important during independent exercise;Long Term: Able to check pulse independently and accurately       Understanding of Exercise Prescription Yes       Intervention Provide education, explanation, and written materials on patient's individual exercise prescription       Expected Outcomes Short Term: Able to explain program exercise prescription;Long Term: Able to explain home exercise prescription to exercise independently          Copy of goals given to participant.

## 2023-10-05 ENCOUNTER — Encounter

## 2023-10-05 DIAGNOSIS — J439 Emphysema, unspecified: Secondary | ICD-10-CM

## 2023-10-05 DIAGNOSIS — J449 Chronic obstructive pulmonary disease, unspecified: Secondary | ICD-10-CM | POA: Diagnosis not present

## 2023-10-05 NOTE — Progress Notes (Signed)
 Daily Session Note  Patient Details  Name: Louis [name] MRN: [id number] Date of Birth: April 05, 1960 Referring Provider:   Conrad Ports Pulmonary Rehab from 10/03/2023 in [facility] Cardiac and Pulmonary Rehab  Referring Provider Dr. Elna Else    Encounter Date: 10/05/2023  Check In:  Session Check In - 10/05/23 9147       Check-In   Supervising physician immediately available to respond to emergencies See telemetry face sheet for immediately available ER MD    Location [facility]-Cardiac & Pulmonary Rehab    Staff Present Burnard Davenport RN,BSN,MPA;Joseph Hood RCP,RRT,BSRT;Maxon Conetta BS, Exercise Physiologist;Noah Tickle, BS, Exercise Physiologist    Virtual Visit No    Medication changes reported     No    Fall or balance concerns reported    No    Tobacco Cessation No Change    Warm-up and Cool-down Performed on first and last piece of equipment    Resistance Training Performed Yes    VAD Patient? No    PAD/SET Patient? No      Pain Assessment   Currently in Pain? No/denies             Social History   Tobacco Use  Smoking Status Never  Smokeless Tobacco Never    Goals Met:  Proper associated with RPD/PD & O2 Sat Independence with exercise equipment Using PLB without cueing & demonstrates good technique Exercise tolerated well No report of concerns or symptoms today Strength training completed today  Goals Unmet:  Not Applicable  Comments: First full day of exercise!  Patient was oriented to gym and equipment including functions, settings, policies, and procedures.  Patient's individual exercise prescription and treatment plan were reviewed.  All starting workloads were established based on the results of the 6 minute walk test done at initial orientation visit.  The plan for exercise progression was also introduced and progression will be customized based on patient's performance and goals.    Dr. Oneil Pinal is Medical Director for [facility] Cardiac  Rehabilitation.  Dr. Fuad Aleskerov is Medical Director for [facility] Pulmonary Rehabilitation.

## 2023-10-08 ENCOUNTER — Encounter

## 2023-10-08 DIAGNOSIS — J449 Chronic obstructive pulmonary disease, unspecified: Secondary | ICD-10-CM | POA: Diagnosis not present

## 2023-10-08 DIAGNOSIS — J439 Emphysema, unspecified: Secondary | ICD-10-CM

## 2023-10-08 NOTE — Progress Notes (Signed)
 Daily Session Note  Patient Details  Name: Louis [name] MRN: [id number] Date of Birth: Jun 26, 1960 Referring Provider:   Conrad Ports Pulmonary Rehab from 10/03/2023 in [facility] Cardiac and Pulmonary Rehab  Referring Provider Dr. Elna Else    Encounter Date: 10/08/2023  Check In:  Session Check In - 10/08/23 1050       Check-In   Supervising physician immediately available to respond to emergencies See telemetry face sheet for immediately available ER MD    Location [facility]-Cardiac & Pulmonary Rehab    Staff Present Burnard Davenport RN,BSN,MPA;Joseph Rolinda RCP,RRT,BSRT;Laura Cates RN,BSN;Canaan Prue Dyane BS, ACSM CEP, Exercise Physiologist    Virtual Visit No    Medication changes reported     No    Fall or balance concerns reported    No    Tobacco Cessation No Change    Warm-up and Cool-down Performed on first and last piece of equipment    Resistance Training Performed Yes    VAD Patient? No    PAD/SET Patient? No      Pain Assessment   Currently in Pain? No/denies             Social History   Tobacco Use  Smoking Status Never  Smokeless Tobacco Never    Goals Met:  Proper associated with RPD/PD & O2 Sat Independence with exercise equipment Using PLB without cueing & demonstrates good technique Exercise tolerated well No report of concerns or symptoms today Strength training completed today  Goals Unmet:  Not Applicable  Comments: Pt able to follow exercise prescription today without complaint.  Will continue to monitor for progression.    Dr. Oneil Pinal is Medical Director for [facility] Cardiac Rehabilitation.  Dr. Fuad Aleskerov is Medical Director for [facility] Pulmonary Rehabilitation.

## 2023-10-10 ENCOUNTER — Encounter: Admitting: Emergency Medicine

## 2023-10-10 DIAGNOSIS — J449 Chronic obstructive pulmonary disease, unspecified: Secondary | ICD-10-CM | POA: Diagnosis not present

## 2023-10-10 DIAGNOSIS — I5022 Chronic systolic (congestive) heart failure: Secondary | ICD-10-CM

## 2023-10-10 DIAGNOSIS — J439 Emphysema, unspecified: Secondary | ICD-10-CM

## 2023-10-10 NOTE — Progress Notes (Signed)
 Daily Session Note  Patient Details  Name: MARRIS FRONTERA MRN: [id number] Date of Birth: 22-Jan-1961 Referring Provider:   Conrad Ports Pulmonary Rehab from 10/03/2023 in [facility] Cardiac and Pulmonary Rehab  Referring Provider Dr. Elna Else    Encounter Date: 10/10/2023  Check In:  Session Check In - 10/10/23 0926       Check-In   Supervising physician immediately available to respond to emergencies See telemetry face sheet for immediately available ER MD    Location [facility]-Cardiac & Pulmonary Rehab    Staff Present Rollene Paterson, MS, Exercise Physiologist;Laureen Delores, BS, RRT, CPFT;Maxon Conetta BS, Exercise Physiologist;Ulysse Siemen RN,BSN    Virtual Visit No    Medication changes reported     No    Fall or balance concerns reported    No    Tobacco Cessation No Change    Warm-up and Cool-down Performed on first and last piece of equipment    Resistance Training Performed Yes    VAD Patient? No    PAD/SET Patient? No      Pain Assessment   Currently in Pain? No/denies             Social History   Tobacco Use  Smoking Status Never  Smokeless Tobacco Never    Goals Met:  Proper associated with RPD/PD & O2 Sat Independence with exercise equipment Using PLB without cueing & demonstrates good technique Exercise tolerated well No report of concerns or symptoms today Strength training completed today  Goals Unmet:  Not Applicable  Comments: Pt able to follow exercise prescription today without complaint.  Will continue to monitor for progression.    Dr. Oneil Pinal is Medical Director for [facility] Cardiac Rehabilitation.  Dr. Fuad Aleskerov is Medical Director for [facility] Pulmonary Rehabilitation.

## 2023-10-10 NOTE — Progress Notes (Signed)
 Pulmonary Individual Treatment Plan  Patient Details  Name: SAVA PROBY MRN: [id number] Date of Birth: 25-May-1960 Referring Provider:   Flowsheet Row Pulmonary Rehab from 10/03/2023 in [facility] Cardiac and Pulmonary Rehab  Referring Provider Dr. Elna Else    Initial Encounter Date:  Flowsheet Row Pulmonary Rehab from 10/03/2023 in [facility] Cardiac and Pulmonary Rehab  Date 10/03/23    Visit Diagnosis: Pulmonary emphysema, unspecified emphysema type (HCC)  Heart failure, chronic systolic (HCC)  Patient's Home Medications on Admission:  Current Outpatient Medications:    AURYXIA  1 GM 210 MG(Fe) tablet, Take 1 tablet by mouth 4 (four) times daily -  before meals and at bedtime. (Patient not taking: Reported on 10/12/2022), Disp: , Rfl:    B Complex-C-Folic Acid  (RENA-VITE RX) 1 MG TABS, Take 1 tablet by mouth daily. (Patient not taking: Reported on 04/25/2022), Disp: , Rfl:    BIKTARVY 50-200-25 MG TABS tablet, Take 1 tablet by mouth daily., Disp: , Rfl:    calcium  carbonate (TUMS) 500 MG chewable tablet, Chew by mouth. (Patient not taking: Reported on 09/28/2023), Disp: , Rfl:    carvedilol  (COREG ) 12.5 MG tablet, Take by mouth. (Patient not taking: Reported on 09/28/2023), Disp: , Rfl:    clindamycin (CLEOCIN T) 1 % SWAB, Apply topically., Disp: , Rfl:    clobetasol ointment (TEMOVATE) 0.05 %, Apply topically 2 (two) times daily as needed. (Patient not taking: Reported on 10/12/2022), Disp: , Rfl:    DESCOVY  200-25 MG tablet, Take 1 tablet by mouth daily. (Patient not taking: Reported on 09/28/2023), Disp: , Rfl:    diclofenac  Sodium (VOLTAREN ) 1 % GEL, Apply 4 g topically 4 (four) times daily. (Patient not taking: Reported on 04/25/2022), Disp: 100 g, Rfl: 1   doxycycline  (VIBRAMYCIN ) 100 MG capsule, Take 100 mg by mouth 2 (two) times daily., Disp: , Rfl:    methocarbamol  (ROBAXIN ) 500 MG tablet, TAKE 1 TABLET (500 MG TOTAL) BY MOUTH 2 (TWO) TIMES DAILY AS NEEDED. FOR PAIN (Patient not  taking: Reported on 09/28/2023), Disp: 60 tablet, Rfl: 0   midodrine (PROAMATINE) 5 MG tablet, Take 5 mg by mouth., Disp: , Rfl:    minoxidil (LONITEN) 2.5 MG tablet, Take 2.5 mg by mouth daily. (Patient not taking: Reported on 04/25/2022), Disp: , Rfl:    Omeprazole 20 MG TBEC, Take 20 mg by mouth., Disp: , Rfl:    ondansetron  (ZOFRAN -ODT) 4 MG disintegrating tablet, Take 4 mg by mouth every 8 (eight) hours as needed. (Patient not taking: Reported on 10/12/2022), Disp: , Rfl:    predniSONE  (DELTASONE ) 10 MG tablet, Take 1 tablet (10 mg total) by mouth 2 (two) times daily as needed. Prn gout attack for 2 days, Disp: 10 tablet, Rfl: 0   prochlorperazine  (COMPAZINE ) 5 MG tablet, Take 1 tablet (5 mg total) by mouth every 6 (six) hours as needed for nausea or vomiting (hiccups)., Disp: 15 tablet, Rfl: 0   TIVICAY  50 MG tablet, Take 1 tablet by mouth daily. (Patient not taking: Reported on 04/25/2022), Disp: , Rfl:    tretinoin (RETIN-A) 0.1 % cream, Apply thin layer to shoulders for acne , starting 2 nights per week and advancing to nightly  as tolerated.Okay to mix with moisturizer or skip a few nights as needed for dryness., Disp: , Rfl:    valACYclovir  (VALTREX ) 500 MG tablet, Take 500 mg by mouth daily as needed (for flare ups).  (Patient taking differently: Take 500 mg by mouth every other day.), Disp: , Rfl:  valACYclovir  (VALTREX ) 500 MG tablet, Take by mouth. (Patient not taking: Reported on 10/12/2022), Disp: , Rfl:   Past Medical History: Past Medical History:  Diagnosis Date   Chronic kidney disease    esrd   Dermatophytosis of foot 06/23/2004   Environmental allergies    ESRD (end stage renal disease) on dialysis (HCC)    Gout    HIV infection (HCC) 12/1995   Hypertension    Seborrhea 06/23/2004    Tobacco Use: Social History   Tobacco Use  Smoking Status Never  Smokeless Tobacco Never    Labs: Review Flowsheet       Latest Ref Rng & Units 06/09/2010 08/09/2011 08/23/2016  Labs  for ITP Cardiac and Pulmonary Rehab  Cholestrol 0 - 200 mg/dL 813  820  -  LDL (calc) 0 - 99 mg/dL 882  892  -  HDL-C >60 mg/dL 55  54  -  Trlycerides <150 mg/dL 69  89  -  Hemoglobin J8r 4.8 - 5.6 % - - 5.1      Pulmonary Assessment Scores:  Pulmonary Assessment Scores     Row Name 10/03/23 1024         ADL UCSD   ADL Phase Entry       CAT Score   CAT Score 13       mMRC Score   mMRC Score 0        UCSD: Self-administered rating of dyspnea associated with activities of daily living (ADLs) 6-point scale (0 = not at all to 5 = maximal or unable to do because of breathlessness)  Scoring Scores range from 0 to 120.  Minimally important difference is 5 units  CAT: CAT can identify the health impairment of COPD patients and is better correlated with disease progression.  CAT has a scoring range of zero to 40. The CAT score is classified into four groups of low (less than 10), medium (10 - 20), high (21-30) and very high (31-40) based on the impact level of disease on health status. A CAT score over 10 suggests significant symptoms.  A worsening CAT score could be explained by an exacerbation, poor medication adherence, poor inhaler technique, or progression of COPD or comorbid conditions.  CAT MCID is 2 points  mMRC: mMRC (Modified Medical Research Council) Dyspnea Scale is used to assess the degree of baseline functional disability in patients of respiratory disease due to dyspnea. No minimal important difference is established. A decrease in score of 1 point or greater is considered a positive change.   Pulmonary Function Assessment:   Exercise Target Goals: Exercise Program Goal: Individual exercise prescription set using results from initial 6 min walk test and THRR while considering  patient's activity barriers and safety.   Exercise Prescription Goal: Initial exercise prescription builds to 30-45 minutes a day of aerobic activity, 2-3 days per week.  Home exercise  guidelines will be given to patient during program as part of exercise prescription that the participant will acknowledge.  Education: Aerobic Exercise: - Group verbal and visual presentation on the components of exercise prescription. Introduces F.I.T.T principle from ACSM for exercise prescriptions.  Reviews F.I.T.T. principles of aerobic exercise including progression. Written material provided at class time.   Education: Resistance Exercise: - Group verbal and visual presentation on the components of exercise prescription. Introduces F.I.T.T principle from ACSM for exercise prescriptions  Reviews F.I.T.T. principles of resistance exercise including progression. Written material provided at class time.    Education: Exercise & Equipment Safety: -  Individual verbal instruction and demonstration of equipment use and safety with use of the equipment. Flowsheet Row Pulmonary Rehab from 10/10/2023 in [facility] Cardiac and Pulmonary Rehab  Date 10/03/23  Educator [facility]  Instruction Review Code 1- Verbalizes Understanding    Education: Exercise Physiology & General Exercise Guidelines: - Group verbal and written instruction with models to review the exercise physiology of the cardiovascular system and associated critical values. Provides general exercise guidelines with specific guidelines to those with heart or lung disease.    Education: Flexibility, Balance, Mind/Body Relaxation: - Group verbal and visual presentation with interactive activity on the components of exercise prescription. Introduces F.I.T.T principle from ACSM for exercise prescriptions. Reviews F.I.T.T. principles of flexibility and balance exercise training including progression. Also discusses the mind body connection.  Reviews various relaxation techniques to help reduce and manage stress (i.e. Deep breathing, progressive muscle relaxation, and visualization). Balance handout provided to take home. Written material provided at class  time.   Activity Barriers & Risk Stratification:  Activity Barriers & Cardiac Risk Stratification - 10/03/23 1017       Activity Barriers & Cardiac Risk Stratification   Activity Barriers None          6 Minute Walk:  6 Minute Walk     Row Name 10/03/23 1012         6 Minute Walk   Phase Initial     Distance 1340 feet     Walk Time 6 minutes     # of Rest Breaks 0     MPH 2.53     METS 3.46     RPE 7     Perceived Dyspnea  0     VO2 Peak 12.1     Symptoms No     Resting HR 53 bpm     Resting BP 128/70     Resting Oxygen  Saturation  98 %     Exercise Oxygen  Saturation  during 6 min walk 91 %     Max Ex. HR 71 bpm     Max Ex. BP 142/78     2 Minute Post BP 134/74       Interval HR   1 Minute HR 60     2 Minute HR 71     3 Minute HR 69     4 Minute HR 69     Interval Heart Rate? Yes       Interval Oxygen    Interval Oxygen ? Yes     Baseline Oxygen  Saturation % 98 %     1 Minute Oxygen  Saturation % 95 %     1 Minute Liters of Oxygen  0 L     2 Minute Oxygen  Saturation % 94 %     2 Minute Liters of Oxygen  0 L     3 Minute Oxygen  Saturation % 91 %     3 Minute Liters of Oxygen  0 L     4 Minute Oxygen  Saturation % 91 %     4 Minute Liters of Oxygen  0 L     5 Minute Liters of Oxygen  0 L     6 Minute Liters of Oxygen  0 L     2 Minute Post Liters of Oxygen  0 L       Oxygen  Initial Assessment:  Oxygen  Initial Assessment - 10/03/23 1021       Home Oxygen    Home Oxygen  Device None    Sleep Oxygen  Prescription None    Home Exercise Oxygen   Prescription None    Home Resting Oxygen  Prescription None    Compliance with Home Oxygen  Use Yes      Initial 6 min Walk   Oxygen  Used None      Program Oxygen  Prescription   Program Oxygen  Prescription None      Intervention   Short Term Goals To learn and demonstrate proper pursed lip breathing techniques or other breathing techniques. ;To learn and understand importance of monitoring SPO2 with pulse oximeter and  demonstrate accurate use of the pulse oximeter.;To learn and understand importance of maintaining oxygen  saturations>88%    Long  Term Goals Maintenance of O2 saturations>88%;Exhibits proper breathing techniques, such as pursed lip breathing or other method taught during program session;Compliance with respiratory medication;Verbalizes importance of monitoring SPO2 with pulse oximeter and return demonstration          Oxygen  Re-Evaluation:  Oxygen  Re-Evaluation     Row Name 10/05/23 0853             Program Oxygen  Prescription   Program Oxygen  Prescription None         Home Oxygen    Home Oxygen  Device None       Sleep Oxygen  Prescription None       Home Exercise Oxygen  Prescription None       Home Resting Oxygen  Prescription None       Compliance with Home Oxygen  Use Yes         Goals/Expected Outcomes   Short Term Goals To learn and demonstrate proper pursed lip breathing techniques or other breathing techniques. ;To learn and understand importance of monitoring SPO2 with pulse oximeter and demonstrate accurate use of the pulse oximeter.;To learn and understand importance of maintaining oxygen  saturations>88%       Long  Term Goals Maintenance of O2 saturations>88%;Exhibits proper breathing techniques, such as pursed lip breathing or other method taught during program session;Compliance with respiratory medication;Verbalizes importance of monitoring SPO2 with pulse oximeter and return demonstration       Comments Reviewed PLB technique with pt.  Talked about how it works and it's importance in maintaining their exercise saturations.       Goals/Expected Outcomes Short: Become more profiecient at using PLB. Long: Become independent at using PLB.          Oxygen  Discharge (Final Oxygen  Re-Evaluation):  Oxygen  Re-Evaluation - 10/05/23 0853       Program Oxygen  Prescription   Program Oxygen  Prescription None      Home Oxygen    Home Oxygen  Device None    Sleep Oxygen   Prescription None    Home Exercise Oxygen  Prescription None    Home Resting Oxygen  Prescription None    Compliance with Home Oxygen  Use Yes      Goals/Expected Outcomes   Short Term Goals To learn and demonstrate proper pursed lip breathing techniques or other breathing techniques. ;To learn and understand importance of monitoring SPO2 with pulse oximeter and demonstrate accurate use of the pulse oximeter.;To learn and understand importance of maintaining oxygen  saturations>88%    Long  Term Goals Maintenance of O2 saturations>88%;Exhibits proper breathing techniques, such as pursed lip breathing or other method taught during program session;Compliance with respiratory medication;Verbalizes importance of monitoring SPO2 with pulse oximeter and return demonstration    Comments Reviewed PLB technique with pt.  Talked about how it works and it's importance in maintaining their exercise saturations.    Goals/Expected Outcomes Short: Become more profiecient at using PLB. Long: Become independent at using PLB.  Initial Exercise Prescription:  Initial Exercise Prescription - 10/03/23 1000       Date of Initial Exercise RX and Referring Provider   Date 10/03/23    Referring Provider Dr. Elna Else      Oxygen    Maintain Oxygen  Saturation 88% or higher      Treadmill   MPH 2.6    Grade 0    Minutes 15    METs 3.46      NuStep   Level 3    SPM 80    Minutes 15    METs 3.46      REL-XR   Level 3    Watts 25    Speed 50    Minutes 15    METs 3.46      Biostep-RELP   Level 3    SPM 80    Minutes 15    METs 3.46      Prescription Details   Duration Progress to 30 minutes of continuous aerobic without signs/symptoms of physical distress      Intensity   THRR 40-80% of Max Heartrate 95-137    Ratings of Perceived Exertion 11-13    Perceived Dyspnea 0-4      Progression   Progression Continue to progress workloads to maintain intensity without signs/symptoms of  physical distress.      Resistance Training   Training Prescription Yes    Weight 7lb    Reps 10-15          Perform Capillary Blood Glucose checks as needed.  Exercise Prescription Changes:   Exercise Prescription Changes     Row Name 10/03/23 1000             Response to Exercise   Blood Pressure (Admit) 128/70       Blood Pressure (Exercise) 142/78       Blood Pressure (Exit) 134/74       Heart Rate (Admit) 53 bpm       Heart Rate (Exercise) 71 bpm       Heart Rate (Exit) 69 bpm       Oxygen  Saturation (Admit) 98 %       Oxygen  Saturation (Exercise) 91 %       Oxygen  Saturation (Exit) 91 %       Rating of Perceived Exertion (Exercise) 7       Perceived Dyspnea (Exercise) 0       Symptoms none       Comments results          Exercise Comments:   Exercise Comments     Row Name 10/05/23 [phone]           Exercise Comments First full day of exercise!  Patient was oriented to gym and equipment including functions, settings, policies, and procedures.  Patient's individual exercise prescription and treatment plan were reviewed.  All starting workloads were established based on the results of the 6 minute walk test done at initial orientation visit.  The plan for exercise progression was also introduced and progression will be customized based on patient's performance and goals.          Exercise Goals and Review:   Exercise Goals     Row Name 10/03/23 1019             Exercise Goals   Increase Physical Activity Yes       Intervention Provide advice, education, support and counseling about physical activity/exercise needs.;Develop an individualized exercise prescription for  aerobic and resistive training based on initial evaluation findings, risk stratification, comorbidities and participant's personal goals.       Expected Outcomes Short Term: Attend rehab on a regular basis to increase amount of physical activity.;Long Term: Add in home exercise to  make exercise part of routine and to increase amount of physical activity.;Long Term: Exercising regularly at least 3-5 days a week.       Increase Strength and Stamina Yes       Intervention Provide advice, education, support and counseling about physical activity/exercise needs.;Develop an individualized exercise prescription for aerobic and resistive training based on initial evaluation findings, risk stratification, comorbidities and participant's personal goals.       Expected Outcomes Short Term: Increase workloads from initial exercise prescription for resistance, speed, and METs.;Short Term: Perform resistance training exercises routinely during rehab and add in resistance training at home;Long Term: Improve cardiorespiratory fitness, muscular endurance and strength as measured by increased METs and functional capacity ( )       Able to understand and use rate of perceived exertion (RPE) scale Yes       Intervention Provide education and explanation on how to use RPE scale       Expected Outcomes Short Term: Able to use RPE daily in rehab to express subjective intensity level;Long Term:  Able to use RPE to guide intensity level when exercising independently       Able to understand and use Dyspnea scale Yes       Intervention Provide education and explanation on how to use Dyspnea scale       Expected Outcomes Short Term: Able to use Dyspnea scale daily in rehab to express subjective sense of shortness of breath during exertion;Long Term: Able to use Dyspnea scale to guide intensity level when exercising independently       Knowledge and understanding of Target Heart Rate Range (THRR) Yes       Intervention Provide education and explanation of THRR including how the numbers were predicted and where they are located for reference       Expected Outcomes Short Term: Able to state/look up THRR;Short Term: Able to use daily as guideline for intensity in rehab;Long Term: Able to use THRR to govern  intensity when exercising independently       Able to check pulse independently Yes       Intervention Provide education and demonstration on how to check pulse in carotid and radial arteries.;Review the importance of being able to check your own pulse for safety during independent exercise       Expected Outcomes Short Term: Able to explain why pulse checking is important during independent exercise;Long Term: Able to check pulse independently and accurately       Understanding of Exercise Prescription Yes       Intervention Provide education, explanation, and written materials on patient's individual exercise prescription       Expected Outcomes Short Term: Able to explain program exercise prescription;Long Term: Able to explain home exercise prescription to exercise independently          Exercise Goals Re-Evaluation :  Exercise Goals Re-Evaluation     Row Name 10/05/23 0853             Exercise Goal Re-Evaluation   Exercise Goals Review Increase Physical Activity;Able to understand and use rate of perceived exertion (RPE) scale;Knowledge and understanding of Target Heart Rate Range (THRR);Understanding of Exercise Prescription;Increase Strength and Stamina;Able to understand and use Dyspnea scale;Able  to check pulse independently       Comments Reviewed RPE and dyspnea scale, THR and program prescription with pt today.  Pt voiced understanding and was given a copy of goals to take home.       Expected Outcomes Short: Use RPE daily to regulate intensity. Long: Follow program prescription in THR.          Discharge Exercise Prescription (Final Exercise Prescription Changes):  Exercise Prescription Changes - 10/03/23 1000       Response to Exercise   Blood Pressure (Admit) 128/70    Blood Pressure (Exercise) 142/78    Blood Pressure (Exit) 134/74    Heart Rate (Admit) 53 bpm    Heart Rate (Exercise) 71 bpm    Heart Rate (Exit) 69 bpm    Oxygen  Saturation (Admit) 98 %    Oxygen   Saturation (Exercise) 91 %    Oxygen  Saturation (Exit) 91 %    Rating of Perceived Exertion (Exercise) 7    Perceived Dyspnea (Exercise) 0    Symptoms none    Comments results          Nutrition:  Target Goals: Understanding of nutrition guidelines, daily intake of sodium 1500mg , cholesterol 200mg , calories 30% from fat and 7% or less from saturated fats, daily to have 5 or more servings of fruits and vegetables.  Education: Nutrition 1 -Group instruction provided by verbal, written material, interactive activities, discussions, models, and posters to present general guidelines for heart healthy nutrition including macronutrients, label reading, and promoting whole foods over processed counterparts. Education serves as Pensions consultant of discussion of heart healthy eating for all. Written material provided at class time.     Education: Nutrition 2 -Group instruction provided by verbal, written material, interactive activities, discussions, models, and posters to present general guidelines for heart healthy nutrition including sodium, cholesterol, and saturated fat. Providing guidance of habit forming to improve blood pressure, cholesterol, and body weight. Written material provided at class time.     Biometrics:  Pre Biometrics - 10/03/23 1020       Pre Biometrics   Height 6' 1.1 (1.857 m)    Weight 194 lb 12.8 oz (88.4 kg)    Waist Circumference 36 inches    Hip Circumference 39 inches    Waist to Hip Ratio 0.92 %    BMI (Calculated) 25.62    Single Leg Stand 17.5 seconds           Nutrition Therapy Plan and Nutrition Goals:   Nutrition Assessments:  MEDIFICTS Score Key: >=70 Need to make dietary changes  40-70 Heart Healthy Diet <= 40 Therapeutic Level Cholesterol Diet  Flowsheet Row Pulmonary Rehab from 10/03/2023 in [facility] Cardiac and Pulmonary Rehab  Picture Your Plate Total Score on Admission 44   Picture Your Plate Scores: <59 Unhealthy dietary pattern  with much room for improvement. 41-50 Dietary pattern unlikely to meet recommendations for good health and room for improvement. 51-60 More healthful dietary pattern, with some room for improvement.  >60 Healthy dietary pattern, although there may be some specific behaviors that could be improved.   Nutrition Goals Re-Evaluation:   Nutrition Goals Discharge (Final Nutrition Goals Re-Evaluation):   Psychosocial: Target Goals: Acknowledge presence or absence of significant depression and/or stress, maximize coping skills, provide positive support system. Participant is able to verbalize types and ability to use techniques and skills needed for reducing stress and depression.   Education: Stress, Anxiety, and Depression - Group verbal and visual presentation to define  topics covered.  Reviews how body is impacted by stress, anxiety, and depression.  Also discusses healthy ways to reduce stress and to treat/manage anxiety and depression.  Written material provided at class time.   Education: Sleep Hygiene -Provides group verbal and written instruction about how sleep can affect your health.  Define sleep hygiene, discuss sleep cycles and impact of sleep habits. Review good sleep hygiene tips.    Initial Review & Psychosocial Screening:  Initial Psych Review & Screening - 09/28/23 1040       Initial Review   Current issues with Current Stress Concerns    Source of Stress Concerns Chronic Illness      Family Dynamics   Good Support System? Yes      Barriers   Psychosocial barriers to participate in program There are no identifiable barriers or psychosocial needs.      Screening Interventions   Interventions Encouraged to exercise;To provide support and resources with identified psychosocial needs;Provide feedback about the scores to participant    Expected Outcomes Short Term goal: Utilizing psychosocial counselor, staff and physician to assist with identification of specific  Stressors or current issues interfering with healing process. Setting desired goal for each stressor or current issue identified.;Long Term Goal: Stressors or current issues are controlled or eliminated.;Short Term goal: Identification and review with participant of any Quality of Life or Depression concerns found by scoring the questionnaire.;Long Term goal: The participant improves quality of Life and PHQ9 Scores as seen by post scores and/or verbalization of changes          Quality of Life Scores:  Scores of 19 and below usually indicate a poorer quality of life in these areas.  A difference of  2-3 points is a clinically meaningful difference.  A difference of 2-3 points in the total score of the Quality of Life Index has been associated with significant improvement in overall quality of life, self-image, physical symptoms, and general health in studies assessing change in quality of life.  PHQ-9: Review Flowsheet  More data exists      12/14/2022 10/31/2022 04/25/2022 10/05/2020 09/16/2020  Depression screen PHQ 2/9  Decreased Interest 0 2 0 0 0  Down, Depressed, Hopeless 0 0 0 0 0  PHQ - 2 Score 0 2 0 0 0  Altered sleeping 0 1 0 - -  Tired, decreased energy 2 2 1  - -  Change in appetite 0 2 0 - -  Feeling bad or failure about yourself  0 0 0 - -  Trouble concentrating 0 0 0 - -  Moving slowly or fidgety/restless 0 0 1 - -  Suicidal thoughts 0 0 0 - -  PHQ-9 Score 2 7 2  - -  Difficult doing work/chores Not difficult at all - - - -   Interpretation of Total Score  Total Score Depression Severity:  1-4 = Minimal depression, 5-9 = Mild depression, 10-14 = Moderate depression, 15-19 = Moderately severe depression, 20-27 = Severe depression   Psychosocial Evaluation and Intervention:  Psychosocial Evaluation - 09/28/23 1056       Psychosocial Evaluation & Interventions   Interventions Encouraged to exercise with the program and follow exercise prescription;Stress management  education;Relaxation education    Comments Mr. Seago is coming to pulmonary rehab. He mentions that his transplant team wants him to complete the program in preparation for a kidney transplant work up. 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 He reports his biggest stress concern is  how he is going to do exercising given his long covid symptoms versus renal symptoms. He has done the program before, so he knows what to expect. He does state his stress level has been higher thinking about attending the program, but he is wanting to attend to help prepare him for transplant work up.    Expected Outcomes Short: attend pulmonary rehab for education and exercise Long: develop and maintain positive self care habits.    Continue Psychosocial Services  Follow up required by staff          Psychosocial Re-Evaluation:   Psychosocial Discharge (Final Psychosocial Re-Evaluation):   Education: Education Goals: Education classes will be provided on a weekly basis, covering required topics. Participant will state understanding/return demonstration of topics presented.  Learning Barriers/Preferences:  Learning Barriers/Preferences - 09/28/23 1039       Learning Barriers/Preferences   Learning Barriers Exercise Concerns    Learning Preferences None          General Pulmonary Education Topics:  Infection Prevention: - Provides verbal and written material to individual with discussion of infection control including proper hand washing and proper equipment cleaning during exercise session. Flowsheet Row Pulmonary Rehab from 10/10/2023 in [facility] Cardiac and Pulmonary Rehab  Date 10/03/23  Educator [facility]  Instruction Review Code 1- Verbalizes Understanding    Falls Prevention: - Provides verbal and written material to individual with discussion of falls prevention and safety. Flowsheet Row Pulmonary Rehab from 10/10/2023 in [facility] Cardiac and Pulmonary Rehab  Date 10/03/23   Educator [facility]  Instruction Review Code 1- Verbalizes Understanding    Chronic Lung Disease Review: - Group verbal instruction with posters, models, PowerPoint presentations and videos,  to review new updates, new respiratory medications, new advancements in procedures and treatments. Providing information on websites and 800 numbers for continued self-education. Includes information about supplement oxygen , available portable oxygen  systems, continuous and intermittent flow rates, oxygen  safety, concentrators, and Medicare reimbursement for oxygen . Explanation of Pulmonary Drugs, including class, frequency, complications, importance of spacers, rinsing mouth after steroid MDI's, and proper cleaning methods for nebulizers. Review of basic lung anatomy and physiology related to function, structure, and complications of lung disease. Review of risk factors. Discussion about methods for diagnosing sleep apnea and types of masks and machines for OSA. Includes a review of the use of types of environmental controls: home humidity, furnaces, filters, dust mite/pet prevention, HEPA vacuums. Discussion about weather changes, air quality and the benefits of nasal washing. Instruction on Warning signs, infection symptoms, calling MD promptly, preventive modes, and value of vaccinations. Review of effective airway clearance, coughing and/or vibration techniques. Emphasizing that all should Create an Action Plan. Written material provided at class time. Flowsheet Row Pulmonary Rehab from 10/10/2023 in [facility] Cardiac and Pulmonary Rehab  Education need identified 10/03/23    AED/CPR: - Group verbal and written instruction with the use of models to demonstrate the basic use of the AED with the basic ABC's of resuscitation.    Tests and Procedures:  - Group verbal and visual presentation and models provide information about basic cardiac anatomy and function. Reviews the testing methods done to diagnose heart disease  and the outcomes of the test results. Describes the treatment choices: Medical Management, Angioplasty, or Coronary Bypass Surgery for treating various heart conditions including Myocardial Infarction, Angina, Valve Disease, and Cardiac Arrhythmias.  Written material provided at class time. Flowsheet Row Pulmonary Rehab from 10/10/2023 in [facility] Cardiac and Pulmonary Rehab  Date 10/10/23  Educator [facility]  Instruction  Review Code 1- Verbalizes Understanding    Medication Safety: - Group verbal and visual instruction to review commonly prescribed medications for heart and lung disease. Reviews the medication, class of the drug, and side effects. Includes the steps to properly store meds and maintain the prescription regimen.  Written material given at graduation.   Other: -Provides group and verbal instruction on various topics (see comments)   Knowledge Questionnaire Score:  Knowledge Questionnaire Score - 10/05/23 1026       Knowledge Questionnaire Score   Pre Score 12/18           Core Components/Risk Factors/Patient Goals at Admission:  Personal Goals and Risk Factors at Admission - 09/28/23 1039       Core Components/Risk Factors/Patient Goals on Admission   Improve shortness of breath with ADL's Yes    Intervention Provide education, individualized exercise plan and daily activity instruction to help decrease symptoms of SOB with activities of daily living.    Expected Outcomes Short Term: Improve cardiorespiratory fitness to achieve a reduction of symptoms when performing ADLs;Long Term: Be able to perform more ADLs without symptoms or delay the onset of symptoms    Heart Failure Yes    Intervention Provide a combined exercise and nutrition program that is supplemented with education, support and counseling about heart failure. Directed toward relieving symptoms such as shortness of breath, decreased exercise tolerance, and extremity edema.    Expected Outcomes Improve functional  capacity of life;Short term: Attendance in program 2-3 days a week with increased exercise capacity. Reported lower sodium intake. Reported increased fruit and vegetable intake. Reports medication compliance.;Short term: Daily weights obtained and reported for increase. Utilizing diuretic protocols set by physician.;Long term: Adoption of self-care skills and reduction of barriers for early signs and symptoms recognition and intervention leading to self-care maintenance.    Hypertension Yes    Intervention Provide education on lifestyle modifcations including regular physical activity/exercise, weight management, moderate sodium restriction and increased consumption of fresh fruit, vegetables, and low fat dairy, alcohol moderation, and smoking cessation.;Monitor prescription use compliance.    Expected Outcomes Short Term: Continued assessment and intervention until BP is < 140/93mm HG in hypertensive participants. < 130/56mm HG in hypertensive participants with diabetes, heart failure or chronic kidney disease.;Long Term: Maintenance of blood pressure at goal levels.          Education:Diabetes - Individual verbal and written instruction to review signs/symptoms of diabetes, desired ranges of glucose level fasting, after meals and with exercise. Acknowledge that pre and post exercise glucose checks will be done for 3 sessions at entry of program.   Know Your Numbers and Heart Failure: - Group verbal and visual instruction to discuss disease risk factors for cardiac and pulmonary disease and treatment options.  Reviews associated critical values for Overweight/Obesity, Hypertension, Cholesterol, and Diabetes.  Discusses basics of heart failure: signs/symptoms and treatments.  Introduces Heart Failure Zone chart for action plan for heart failure. Written material provided at class time.   Core Components/Risk Factors/Patient Goals Review:    Core Components/Risk Factors/Patient Goals at Discharge  (Final Review):    ITP Comments:  ITP Comments     Row Name 09/28/23 1038 10/03/23 1009 10/05/23 0853 10/10/23 1003     ITP Comments Initial phone call completed. Diagnosis can be found in [facility] 7/25. EP Orientation scheduled for Wednesday 8/20 at 9am. Completed and gym orientation for pulmonary rehab. Initial ITP created and sent for review to Dr. Faud Aleskerov, Medical Director. First  full day of exercise!  Patient was oriented to gym and equipment including functions, settings, policies, and procedures.  Patient's individual exercise prescription and treatment plan were reviewed.  All starting workloads were established based on the results of the 6 minute walk test done at initial orientation visit.  The plan for exercise progression was also introduced and progression will be customized based on patient's performance and goals. 30 Day review completed. Medical Director ITP review done; changes made as directed and signed approval by Medical Director. New to program.       Comments: 30 day review

## 2023-10-12 ENCOUNTER — Encounter

## 2023-10-12 DIAGNOSIS — J439 Emphysema, unspecified: Secondary | ICD-10-CM

## 2023-10-12 DIAGNOSIS — J449 Chronic obstructive pulmonary disease, unspecified: Secondary | ICD-10-CM | POA: Diagnosis not present

## 2023-10-12 NOTE — Progress Notes (Signed)
 Daily Session Note  Patient Details  Name: Louis [name] MRN: [id number] Date of Birth: 29-Nov-1960 Referring Provider:   Conrad Ports Pulmonary Rehab from 10/03/2023 in [facility] Cardiac and Pulmonary Rehab  Referring Provider Dr. Elna Else    Encounter Date: 10/12/2023  Check In:  Session Check In - 10/12/23 9077       Check-In   Supervising physician immediately available to respond to emergencies See telemetry face sheet for immediately available ER MD    Location [facility]-Cardiac & Pulmonary Rehab    Staff Present Burnard Davenport RN,BSN,MPA;Maxon Conetta BS, Exercise Physiologist;Joseph Hood RCP,RRT,BSRT;Noah Tickle, [place], Exercise Physiologist    Virtual Visit No    Medication changes reported     No    Fall or balance concerns reported    No    Tobacco Cessation No Change    Warm-up and Cool-down Performed on first and last piece of equipment    Resistance Training Performed Yes    VAD Patient? No    PAD/SET Patient? No      Pain Assessment   Currently in Pain? No/denies             Social History   Tobacco Use  Smoking Status Never  Smokeless Tobacco Never    Goals Met:  Proper associated with RPD/PD & O2 Sat Independence with exercise equipment Using PLB without cueing & demonstrates good technique Exercise tolerated well No report of concerns or symptoms today Strength training completed today  Goals Unmet:  Not Applicable  Comments: Pt able to follow exercise prescription today without complaint.  Will continue to monitor for progression.    Dr. Oneil Pinal is Medical Director for [facility] Cardiac Rehabilitation.  Dr. Fuad Aleskerov is Medical Director for [facility] Pulmonary Rehabilitation.

## 2023-10-17 ENCOUNTER — Encounter: Attending: Internal Medicine | Admitting: Emergency Medicine

## 2023-10-17 DIAGNOSIS — J439 Emphysema, unspecified: Secondary | ICD-10-CM | POA: Insufficient documentation

## 2023-10-17 NOTE — Progress Notes (Signed)
 Daily Session Note  Patient Details  Name: Louis [name] MRN: [id number] Date of Birth: Apr 06, 1960 Referring Provider:   Conrad Ports Pulmonary Rehab from 10/03/2023 in [facility] Cardiac and Pulmonary Rehab  Referring Provider Dr. Elna Else    Encounter Date: 10/17/2023  Check In:  Session Check In - 10/17/23 0948       Check-In   Supervising physician immediately available to respond to emergencies See telemetry face sheet for immediately available ER MD    Location [facility]-Cardiac & Pulmonary Rehab    Staff Present Burnard Hint BS, ACSM CEP, Exercise Physiologist;Joseph Rolinda RCP,RRT,BSRT;Giovannina Mun RN,BSN;Meredith Tressa RN,BSN    Virtual Visit No    Medication changes reported     No    Fall or balance concerns reported    No    Tobacco Cessation No Change    Warm-up and Cool-down Performed on first and last piece of equipment    Resistance Training Performed Yes    VAD Patient? No    PAD/SET Patient? No      Pain Assessment   Currently in Pain? No/denies             Social History   Tobacco Use  Smoking Status Never  Smokeless Tobacco Never    Goals Met:  Proper associated with RPD/PD & O2 Sat Independence with exercise equipment Using PLB without cueing & demonstrates good technique Exercise tolerated well No report of concerns or symptoms today Strength training completed today  Goals Unmet:  Not Applicable  Comments: Pt able to follow exercise prescription today without complaint.  Will continue to monitor for progression.    Dr. Oneil Pinal is Medical Director for [facility] Cardiac Rehabilitation.  Dr. Fuad Aleskerov is Medical Director for [facility] Pulmonary Rehabilitation.

## 2023-10-19 ENCOUNTER — Encounter: Admitting: *Deleted

## 2023-10-19 DIAGNOSIS — J439 Emphysema, unspecified: Secondary | ICD-10-CM

## 2023-10-19 NOTE — Progress Notes (Signed)
 Daily Session Note  Patient Details  Name: Louis [name] MRN: [id number] Date of Birth: 06/04/1960 Referring Provider:   Conrad Ports Pulmonary Rehab from 10/03/2023 in [facility] Cardiac and Pulmonary Rehab  Referring Provider Dr. Elna Else    Encounter Date: 10/19/2023  Check In:  Session Check In - 10/19/23 0916       Check-In   Supervising physician immediately available to respond to emergencies See telemetry face sheet for immediately available ER MD    Location [facility]-Cardiac & Pulmonary Rehab    Staff Present Hoy Rodney RN,BSN;Joseph Rolinda RCP,RRT,BSRT;Noah Tickle, [place], Exercise Physiologist;Maxon Conetta BS, Exercise Physiologist    Virtual Visit No    Medication changes reported     No    Fall or balance concerns reported    No    Warm-up and Cool-down Performed on first and last piece of equipment    Resistance Training Performed Yes    VAD Patient? No    PAD/SET Patient? No      Pain Assessment   Currently in Pain? No/denies             Social History   Tobacco Use  Smoking Status Never  Smokeless Tobacco Never    Goals Met:  Independence with exercise equipment Exercise tolerated well No report of concerns or symptoms today Strength training completed today  Goals Unmet:  Not Applicable  Comments: Pt able to follow exercise prescription today without complaint.  Will continue to monitor for progression.    Dr. Oneil Pinal is Medical Director for [facility] Cardiac Rehabilitation.  Dr. Fuad Aleskerov is Medical Director for [facility] Pulmonary Rehabilitation.

## 2023-10-22 ENCOUNTER — Encounter

## 2023-10-22 DIAGNOSIS — J439 Emphysema, unspecified: Secondary | ICD-10-CM

## 2023-10-22 NOTE — Progress Notes (Signed)
 Daily Session Note  Patient Details  Name: KEIGAN TAFOYA MRN: [id number] Date of Birth: 1960/08/01 Referring Provider:   Conrad Ports Pulmonary Rehab from 10/03/2023 in [facility] Cardiac and Pulmonary Rehab  Referring Provider Dr. Elna Else    Encounter Date: 10/22/2023  Check In:  Session Check In - 10/22/23 0928       Check-In   Supervising physician immediately available to respond to emergencies See telemetry face sheet for immediately available ER MD    Location [facility]-Cardiac & Pulmonary Rehab    Staff Present Burnard Davenport RN,BSN,MPA;Maxon Burnell BS, Exercise Physiologist;Joseph [facility] BS, ACSM CEP, Exercise Physiologist    Virtual Visit No    Medication changes reported     No    Fall or balance concerns reported    No    Tobacco Cessation No Change    Warm-up and Cool-down Performed on first and last piece of equipment    Resistance Training Performed Yes    VAD Patient? No    PAD/SET Patient? No      Pain Assessment   Currently in Pain? No/denies             Social History   Tobacco Use  Smoking Status Never  Smokeless Tobacco Never    Goals Met:  Proper associated with RPD/PD & O2 Sat Independence with exercise equipment Using PLB without cueing & demonstrates good technique Exercise tolerated well No report of concerns or symptoms today Strength training completed today  Goals Unmet:  Not Applicable  Comments: Pt able to follow exercise prescription today without complaint.  Will continue to monitor for progression.    Dr. Oneil Pinal is Medical Director for [facility] Cardiac Rehabilitation.  Dr. Fuad Aleskerov is Medical Director for [facility] Pulmonary Rehabilitation.

## 2023-10-24 ENCOUNTER — Encounter: Admitting: Emergency Medicine

## 2023-10-24 DIAGNOSIS — J439 Emphysema, unspecified: Secondary | ICD-10-CM

## 2023-10-24 NOTE — Progress Notes (Signed)
 Daily Session Note  Patient Details  Name: Louis [name] MRN: [id number] Date of Birth: [birth year] Referring Provider:   Conrad Ports Pulmonary Rehab from 10/03/2023 in [facility] Cardiac and Pulmonary Rehab  Referring Provider Dr. Elna Else    Encounter Date: 10/24/2023  Check In:  Session Check In - 10/24/23 0945       Check-In   Supervising physician immediately available to respond to emergencies See telemetry face sheet for immediately available ER MD    Location [facility]-Cardiac & Pulmonary Rehab    Staff Present Rollene Paterson, MS, Exercise Physiologist;Maxon Conetta BS, Exercise Physiologist;Kvion Shapley RN,BSN    Virtual Visit No    Medication changes reported     No    Fall or balance concerns reported    No    Tobacco Cessation No Change    Warm-up and Cool-down Performed on first and last piece of equipment    Resistance Training Performed Yes    VAD Patient? No    PAD/SET Patient? No      Pain Assessment   Currently in Pain? No/denies             Social History   Tobacco Use  Smoking Status Never  Smokeless Tobacco Never    Goals Met:  Proper associated with RPD/PD & O2 Sat Independence with exercise equipment Using PLB without cueing & demonstrates good technique Exercise tolerated well No report of concerns or symptoms today Strength training completed today  Goals Unmet:  Not Applicable  Comments: Pt able to follow exercise prescription today without complaint.  Will continue to monitor for progression.    Dr. Oneil Pinal is Medical Director for [facility] Cardiac Rehabilitation.  Dr. Fuad Aleskerov is Medical Director for [facility] Pulmonary Rehabilitation.

## 2023-10-26 ENCOUNTER — Encounter

## 2023-10-26 DIAGNOSIS — J439 Emphysema, unspecified: Secondary | ICD-10-CM

## 2023-10-26 NOTE — Progress Notes (Signed)
 Daily Session Note  Patient Details  Name: Louis [name] MRN: [id number] Date of Birth: [birth year] Referring Provider:   Conrad Ports Pulmonary Rehab from 10/03/2023 in [facility] Cardiac and Pulmonary Rehab  Referring Provider Dr. Elna Else    Encounter Date: 10/26/2023  Check In:  Session Check In - 10/26/23 0905       Check-In   Supervising physician immediately available to respond to emergencies See telemetry face sheet for immediately available ER MD    Location [facility]-Cardiac & Pulmonary Rehab    Staff Present Burnard Davenport RN,BSN,MPA;Joseph Hood RCP,RRT,BSRT;Maxon Conetta BS, Exercise Physiologist;Noah Tickle, BS, Exercise Physiologist    Virtual Visit No    Medication changes reported     No    Fall or balance concerns reported    No    Tobacco Cessation No Change    Warm-up and Cool-down Performed on first and last piece of equipment    Resistance Training Performed Yes    VAD Patient? No    PAD/SET Patient? No      Pain Assessment   Currently in Pain? No/denies             Social History   Tobacco Use  Smoking Status Never  Smokeless Tobacco Never    Goals Met:  Proper associated with RPD/PD & O2 Sat Independence with exercise equipment Using PLB without cueing & demonstrates good technique Exercise tolerated well No report of concerns or symptoms today Strength training completed today  Goals Unmet:  Not Applicable  Comments: Pt able to follow exercise prescription today without complaint.  Will continue to monitor for progression.    Dr. Oneil Pinal is Medical Director for [facility] Cardiac Rehabilitation.  Dr. Fuad Aleskerov is Medical Director for [facility] Pulmonary Rehabilitation.

## 2023-10-29 ENCOUNTER — Encounter

## 2023-10-29 DIAGNOSIS — J439 Emphysema, unspecified: Secondary | ICD-10-CM

## 2023-10-29 NOTE — Progress Notes (Signed)
 Daily Session Note  Patient Details  Name: BEJAMIN HACKBART MRN: [id number] Date of Birth: [birth year] Referring Provider:   Conrad Ports Pulmonary Rehab from 10/03/2023 in [facility] Cardiac and Pulmonary Rehab  Referring Provider Dr. Elna Else    Encounter Date: 10/29/2023  Check In:  Session Check In - 10/29/23 0909       Check-In   Supervising physician immediately available to respond to emergencies See telemetry face sheet for immediately available ER MD    Location [facility]-Cardiac & Pulmonary Rehab    Staff Present Burnard Davenport RN,BSN,MPA;Joseph Rolinda RCP,RRT,BSRT;Laura Cates RN,BSN;Onnika Siebel Dyane BS, ACSM CEP, Exercise Physiologist    Virtual Visit No    Medication changes reported     No    Fall or balance concerns reported    No    Tobacco Cessation No Change    Warm-up and Cool-down Performed on first and last piece of equipment    Resistance Training Performed Yes    VAD Patient? No    PAD/SET Patient? No      Pain Assessment   Currently in Pain? No/denies             Social History   Tobacco Use  Smoking Status Never  Smokeless Tobacco Never    Goals Met:  Proper associated with RPD/PD & O2 Sat Independence with exercise equipment Using PLB without cueing & demonstrates good technique Exercise tolerated well No report of concerns or symptoms today Strength training completed today  Goals Unmet:  Not Applicable  Comments: Pt able to follow exercise prescription today without complaint.  Will continue to monitor for progression.    Dr. Oneil Pinal is Medical Director for [facility] Cardiac Rehabilitation.  Dr. Fuad Aleskerov is Medical Director for [facility] Pulmonary Rehabilitation.

## 2023-10-31 ENCOUNTER — Encounter

## 2023-10-31 DIAGNOSIS — J439 Emphysema, unspecified: Secondary | ICD-10-CM | POA: Diagnosis not present

## 2023-10-31 NOTE — Progress Notes (Signed)
 Daily Session Note  Patient Details  Name: Louis [name] MRN: [id number] Date of Birth: 11-26-1960 Referring Provider:   Conrad Ports Pulmonary Rehab from 10/03/2023 in [facility] Cardiac and Pulmonary Rehab  Referring Provider Dr. Elna Else    Encounter Date: 10/31/2023  Check In:  Session Check In - 10/31/23 0834       Check-In   Supervising physician immediately available to respond to emergencies See telemetry face sheet for immediately available ER MD    Location [facility]-Cardiac & Pulmonary Rehab    Staff Present Burnard Davenport RN,BSN,MPA;Joseph [facility] RCP,RRT,BSRT;Margaret Best, MS, Exercise Physiologist    Virtual Visit No    Medication changes reported     No    Fall or balance concerns reported    No    Tobacco Cessation No Change    Warm-up and Cool-down Performed on first and last piece of equipment    Resistance Training Performed Yes    VAD Patient? No    PAD/SET Patient? No      Pain Assessment   Currently in Pain? No/denies             Social History   Tobacco Use  Smoking Status Never  Smokeless Tobacco Never    Goals Met:  Proper associated with RPD/PD & O2 Sat Independence with exercise equipment Using PLB without cueing & demonstrates good technique Exercise tolerated well No report of concerns or symptoms today Strength training completed today  Goals Unmet:  Not Applicable  Comments: Pt able to follow exercise prescription today without complaint.  Will continue to monitor for progression.    Dr. Oneil Pinal is Medical Director for [facility] Cardiac Rehabilitation.  Dr. Fuad Aleskerov is Medical Director for [facility] Pulmonary Rehabilitation.

## 2023-11-02 ENCOUNTER — Encounter: Admitting: Emergency Medicine

## 2023-11-02 DIAGNOSIS — J439 Emphysema, unspecified: Secondary | ICD-10-CM

## 2023-11-02 NOTE — Progress Notes (Signed)
 Daily Session Note  Patient Details  Name: Louis [name] MRN: [id number] Date of Birth: 26-Nov-1960 Referring Provider:   [facility] from 10/03/2023 in [facility] Cardiac and Pul[facility] Rehab  Referring Provider Dr. Elna Else    Encounter Date: 11/02/2023  Check In:  Session Check In - 11/02/23 0920       Check-In   Supervising physician immediately available to respond to emergencies See telemetry face sheet for immediately available ER MD    Location [facility]-Cardiac & Pulmonary Rehab    Staff Present Devaughn Jaeger, BS, Exercise Physiologist;Margaret Best, MS, Exercise Physiologist;Joseph Rolinda RCP,RRT,BSRT;Veverly Larimer RN,BSN    Virtual Visit No    Medication changes reported     No    Fall or balance concerns reported    No    Tobacco Cessation No Change    Warm-up and Cool-down Performed on first and last piece of equipment    Resistance Training Performed Yes    VAD Patient? No    PAD/SET Patient? No      Pain Assessment   Currently in Pain? No/denies             Social History   Tobacco Use  Smoking Status Never  Smokeless Tobacco Never    Goals Met:  Proper associated with RPD/PD & O2 Sat Independence with exercise equipment Using PLB without cueing & demonstrates good technique Exercise tolerated well No report of concerns or symptoms today Strength training completed today  Goals Unmet:  Not Applicable  Comments: Pt able to follow exercise prescription today without complaint.  Will continue to monitor for progression.    Dr. Oneil Pinal is Medical Director for [facility] Cardiac Rehabilitation.  Dr. Fuad Aleskerov is Medical Director for [facility] Pulmonary Rehabilitation.

## 2023-11-05 ENCOUNTER — Encounter

## 2023-11-05 DIAGNOSIS — J439 Emphysema, unspecified: Secondary | ICD-10-CM

## 2023-11-05 NOTE — Progress Notes (Signed)
 Daily Session Note  Patient Details  Name: Louis [name] MRN: [id number] Date of Birth: 1960/07/11 Referring Provider:   [facility] from 10/03/2023 in [facility] Cardiac and Pulmonary Rehab  Referring Provider Dr. Elna Else    Encounter Date: 11/05/2023  Check In:  Session Check In - 11/05/23 0908       Check-In   Supervising physician immediately available to respond to emergencies See telemetry face sheet for immediately available ER MD    Location [facility]-Cardiac & Pulmonary Rehab    Staff Present Burnard Davenport RN,BSN,MPA;Joseph [facility] Dyane BS, ACSM CEP, Exercise Physiologist;Jason Elnor RDN,LDN    Virtual Visit No    Medication changes reported     No    Fall or balance concerns reported    No    Tobacco Cessation No Change    Warm-up and Cool-down Performed on first and last piece of equipment    Resistance Training Performed Yes    VAD Patient? No    PAD/SET Patient? No      Pain Assessment   Currently in Pain? No/denies             Social History   Tobacco Use  Smoking Status Never  Smokeless Tobacco Never    Goals Met:  Proper associated with RPD/PD & O2 Sat Independence with exercise equipment Using PLB without cueing & demonstrates good technique Exercise tolerated well No report of concerns or symptoms today Strength training completed today  Goals Unmet:  Not Applicable  Comments: Pt able to follow exercise prescription today without complaint.  Will continue to monitor for progression.   Reviewed home exercise with pt today from 9:15am to 9:25am.  Pt plans to walk for exercise.  Reviewed THR, pulse, RPE, sign and symptoms, pulse oximetery and when to call 911 or MD.  Also discussed weather considerations and indoor options.  Pt voiced understanding.   Dr. Oneil Pinal is Medical Director for [facility] Cardiac Rehabilitation.  Dr. Fuad Aleskerov is Medical Director for [facility] Pulmonary Rehabilitation.

## 2023-11-07 ENCOUNTER — Encounter

## 2023-11-07 DIAGNOSIS — I5022 Chronic systolic (congestive) heart failure: Secondary | ICD-10-CM

## 2023-11-07 DIAGNOSIS — J439 Emphysema, unspecified: Secondary | ICD-10-CM | POA: Diagnosis not present

## 2023-11-07 NOTE — Progress Notes (Signed)
 Daily Session Note  Patient Details  Name: Louis [name] MRN: [id number] Date of Birth: [birth year] Referring Provider:   [facility] from 10/03/2023 in [facility] Cardiac and Pulmonary Rehab  Referring Provider Dr. Elna Else    Encounter Date: 11/07/2023  Check In:  Session Check In - 11/07/23 0830       Check-In   Supervising physician immediately available to respond to emergencies See telemetry face sheet for immediately available ER MD    Location [facility]-Cardiac & Pulmonary Rehab    Staff Present Burnard Davenport [facility] Peggi, RN, DNP, NE-BC;Maxon Conetta BS, Exercise Physiologist;Joseph [employer], MS, Exercise Physiologist    Virtual Visit No    Medication changes reported     No    Fall or balance concerns reported    No    Tobacco Cessation No Change    Warm-up and Cool-down Performed on first and last piece of equipment    Resistance Training Performed Yes    VAD Patient? No    PAD/SET Patient? No      Pain Assessment   Currently in Pain? No/denies             Social History   Tobacco Use  Smoking Status Never  Smokeless Tobacco Never    Goals Met:  Proper associated with RPD/PD & O2 Sat Independence with exercise equipment Using PLB without cueing & demonstrates good technique Exercise tolerated well No report of concerns or symptoms today Strength training completed today  Goals Unmet:  Not Applicable  Comments: Pt able to follow exercise prescription today without complaint.  Will continue to monitor for progression.    Dr. Oneil Pinal is Medical Director for [facility] Cardiac Rehabilitation.  Dr. Fuad Aleskerov is Medical Director for [facility] Pulmonary Rehabilitation.

## 2023-11-07 NOTE — Progress Notes (Signed)
 Pulmonary Individual Treatment Plan  Patient Details  Name: Louis [name] MRN: [id number] Date of Birth: 1960/07/19 Referring Provider:   Flowsheet Row Pulmonary Rehab from 10/03/2023 in [facility] Cardiac and Pulmonary Rehab  Referring Provider Dr. Elna Else    Initial Encounter Date:  Flowsheet Row Pulmonary Rehab from 10/03/2023 in [facility] Cardiac and Pulmonary Rehab  Date 10/03/23    Visit Diagnosis: Pulmonary emphysema, unspecified emphysema type  Heart failure, chronic systolic (HCC)  Patient's Home Medications on Admission:  Current Outpatient Medications:    AURYXIA  1 GM 210 MG(Fe) tablet, Take 1 tablet by mouth 4 (four) times daily -  before meals and at bedtime. (Patient not taking: Reported on 10/12/2022), Disp: , Rfl:    B Complex-C-Folic Acid  (RENA-VITE RX) 1 MG TABS, Take 1 tablet by mouth daily. (Patient not taking: Reported on 04/25/2022), Disp: , Rfl:    BIKTARVY 50-200-25 MG TABS tablet, Take 1 tablet by mouth daily., Disp: , Rfl:    calcium  carbonate (TUMS) 500 MG chewable tablet, Chew by mouth. (Patient not taking: Reported on 09/28/2023), Disp: , Rfl:    carvedilol  (COREG ) 12.5 MG tablet, Take by mouth. (Patient not taking: Reported on 09/28/2023), Disp: , Rfl:    clindamycin (CLEOCIN T) 1 % SWAB, Apply topically., Disp: , Rfl:    clobetasol ointment (TEMOVATE) 0.05 %, Apply topically 2 (two) times daily as needed. (Patient not taking: Reported on 10/12/2022), Disp: , Rfl:    DESCOVY  200-25 MG tablet, Take 1 tablet by mouth daily. (Patient not taking: Reported on 09/28/2023), Disp: , Rfl:    diclofenac  Sodium (VOLTAREN ) 1 % GEL, Apply 4 g topically 4 (four) times daily. (Patient not taking: Reported on 04/25/2022), Disp: 100 g, Rfl: 1   doxycycline  (VIBRAMYCIN ) 100 MG capsule, Take 100 mg by mouth 2 (two) times daily., Disp: , Rfl:    methocarbamol  (ROBAXIN ) 500 MG tablet, TAKE 1 TABLET (500 MG TOTAL) BY MOUTH 2 (TWO) TIMES DAILY AS NEEDED. FOR PAIN (Patient not taking:  Reported on 09/28/2023), Disp: 60 tablet, Rfl: 0   midodrine (PROAMATINE) 5 MG tablet, Take 5 mg by mouth., Disp: , Rfl:    minoxidil (LONITEN) 2.5 MG tablet, Take 2.5 mg by mouth daily. (Patient not taking: Reported on 04/25/2022), Disp: , Rfl:    Omeprazole 20 MG TBEC, Take 20 mg by mouth., Disp: , Rfl:    ondansetron  (ZOFRAN -ODT) 4 MG disintegrating tablet, Take 4 mg by mouth every 8 (eight) hours as needed. (Patient not taking: Reported on 10/12/2022), Disp: , Rfl:    predniSONE  (DELTASONE ) 10 MG tablet, Take 1 tablet (10 mg total) by mouth 2 (two) times daily as needed. Prn gout attack for 2 days, Disp: 10 tablet, Rfl: 0   prochlorperazine  (COMPAZINE ) 5 MG tablet, Take 1 tablet (5 mg total) by mouth every 6 (six) hours as needed for nausea or vomiting (hiccups)., Disp: 15 tablet, Rfl: 0   TIVICAY  50 MG tablet, Take 1 tablet by mouth daily. (Patient not taking: Reported on 04/25/2022), Disp: , Rfl:    tretinoin (RETIN-A) 0.1 % cream, Apply thin layer to shoulders for acne , starting 2 nights per week and advancing to nightly  as tolerated.Okay to mix with moisturizer or skip a few nights as needed for dryness., Disp: , Rfl:    valACYclovir  (VALTREX ) 500 MG tablet, Take 500 mg by mouth daily as needed (for flare ups).  (Patient taking differently: Take 500 mg by mouth every other day.), Disp: , Rfl:    valACYclovir  (  VALTREX ) 500 MG tablet, Take by mouth. (Patient not taking: Reported on 10/12/2022), Disp: , Rfl:   Past Medical History: Past Medical History:  Diagnosis Date   Chronic kidney disease    esrd   Dermatophytosis of foot 06/23/2004   Environmental allergies    ESRD (end stage renal disease) on dialysis (HCC)    Gout    HIV infection (HCC) 12/1995   Hypertension    Seborrhea 06/23/2004    Tobacco Use: Social History   Tobacco Use  Smoking Status Never  Smokeless Tobacco Never    Labs: Review Flowsheet       Latest Ref Rng & Units 06/09/2010 08/09/2011 08/23/2016  Labs for ITP  Cardiac and Pulmonary Rehab  Cholestrol 0 - 200 mg/dL 813  820  -  LDL (calc) 0 - 99 mg/dL 882  892  -  HDL-C >60 mg/dL 55  54  -  Trlycerides <150 mg/dL 69  89  -  Hemoglobin J8r 4.8 - 5.6 % - - 5.1      Pulmonary Assessment Scores:  Pulmonary Assessment Scores     Row Name 10/03/23 1024         ADL UCSD   ADL Phase Entry       CAT Score   CAT Score 13       mMRC Score   mMRC Score 0        UCSD: Self-administered rating of dyspnea associated with activities of daily living (ADLs) 6-point scale (0 = not at all to 5 = maximal or unable to do because of breathlessness)  Scoring Scores range from 0 to 120.  Minimally important difference is 5 units  CAT: CAT can identify the health impairment of COPD patients and is better correlated with disease progression.  CAT has a scoring range of zero to 40. The CAT score is classified into four groups of low (less than 10), medium (10 - 20), high (21-30) and very high (31-40) based on the impact level of disease on health status. A CAT score over 10 suggests significant symptoms.  A worsening CAT score could be explained by an exacerbation, poor medication adherence, poor inhaler technique, or progression of COPD or comorbid conditions.  CAT MCID is 2 points  mMRC: mMRC (Modified Medical Research Council) Dyspnea Scale is used to assess the degree of baseline functional disability in patients of respiratory disease due to dyspnea. No minimal important difference is established. A decrease in score of 1 point or greater is considered a positive change.   Pulmonary Function Assessment:   Exercise Target Goals: Exercise Program Goal: Individual exercise prescription set using results from initial 6 min walk test and THRR while considering  patient's activity barriers and safety.   Exercise Prescription Goal: Initial exercise prescription builds to 30-45 minutes a day of aerobic activity, 2-3 days per week.  Home exercise  guidelines will be given to patient during program as part of exercise prescription that the participant will acknowledge.  Education: Aerobic Exercise: - Group verbal and visual presentation on the components of exercise prescription. Introduces F.I.T.T principle from ACSM for exercise prescriptions.  Reviews F.I.T.T. principles of aerobic exercise including progression. Written material provided at class time.   Education: Resistance Exercise: - Group verbal and visual presentation on the components of exercise prescription. Introduces F.I.T.T principle from ACSM for exercise prescriptions  Reviews F.I.T.T. principles of resistance exercise including progression. Written material provided at class time.    Education: Exercise & Equipment Safety: -  Individual verbal instruction and demonstration of equipment use and safety with use of the equipment. Flowsheet Row Pulmonary Rehab from 11/07/2023 in [facility] Cardiac and Pulmonary Rehab  Date 10/03/23  Educator [facility]  Instruction Review Code 1- Verbalizes Understanding    Education: Exercise Physiology & General Exercise Guidelines: - Group verbal and written instruction with models to review the exercise physiology of the cardiovascular system and associated critical values. Provides general exercise guidelines with specific guidelines to those with heart or lung disease.    Education: Flexibility, Balance, Mind/Body Relaxation: - Group verbal and visual presentation with interactive activity on the components of exercise prescription. Introduces F.I.T.T principle from ACSM for exercise prescriptions. Reviews F.I.T.T. principles of flexibility and balance exercise training including progression. Also discusses the mind body connection.  Reviews various relaxation techniques to help reduce and manage stress (i.e. Deep breathing, progressive muscle relaxation, and visualization). Balance handout provided to take home. Written material provided at class  time.   Activity Barriers & Risk Stratification:  Activity Barriers & Cardiac Risk Stratification - 10/03/23 1017       Activity Barriers & Cardiac Risk Stratification   Activity Barriers None          6 Minute Walk:  6 Minute Walk     Row Name 10/03/23 1012         6 Minute Walk   Phase Initial     Distance 1340 feet     Walk Time 6 minutes     # of Rest Breaks 0     MPH 2.53     METS 3.46     RPE 7     Perceived Dyspnea  0     VO2 Peak 12.1     Symptoms No     Resting HR 53 bpm     Resting BP 128/70     Resting Oxygen  Saturation  98 %     Exercise Oxygen  Saturation  during 6 min walk 91 %     Max Ex. HR 71 bpm     Max Ex. BP 142/78     2 Minute Post BP 134/74       Interval HR   1 Minute HR 60     2 Minute HR 71     3 Minute HR 69     4 Minute HR 69     Interval Heart Rate? Yes       Interval Oxygen    Interval Oxygen ? Yes     Baseline Oxygen  Saturation % 98 %     1 Minute Oxygen  Saturation % 95 %     1 Minute Liters of Oxygen  0 L     2 Minute Oxygen  Saturation % 94 %     2 Minute Liters of Oxygen  0 L     3 Minute Oxygen  Saturation % 91 %     3 Minute Liters of Oxygen  0 L     4 Minute Oxygen  Saturation % 91 %     4 Minute Liters of Oxygen  0 L     5 Minute Liters of Oxygen  0 L     6 Minute Liters of Oxygen  0 L     2 Minute Post Liters of Oxygen  0 L       Oxygen  Initial Assessment:  Oxygen  Initial Assessment - 10/03/23 1021       Home Oxygen    Home Oxygen  Device None    Sleep Oxygen  Prescription None    Home Exercise Oxygen   Prescription None    Home Resting Oxygen  Prescription None    Compliance with Home Oxygen  Use Yes      Initial 6 min Walk   Oxygen  Used None      Program Oxygen  Prescription   Program Oxygen  Prescription None      Intervention   Short Term Goals To learn and demonstrate proper pursed lip breathing techniques or other breathing techniques. ;To learn and understand importance of monitoring SPO2 with pulse oximeter and  demonstrate accurate use of the pulse oximeter.;To learn and understand importance of maintaining oxygen  saturations>88%    Long  Term Goals Maintenance of O2 saturations>88%;Exhibits proper breathing techniques, such as pursed lip breathing or other method taught during program session;Compliance with respiratory medication;Verbalizes importance of monitoring SPO2 with pulse oximeter and return demonstration          Oxygen  Re-Evaluation:  Oxygen  Re-Evaluation     Row Name 10/05/23 0853 10/22/23 0939           Program Oxygen  Prescription   Program Oxygen  Prescription None None        Home Oxygen    Home Oxygen  Device None None      Sleep Oxygen  Prescription None None      Home Exercise Oxygen  Prescription None None      Home Resting Oxygen  Prescription None None      Compliance with Home Oxygen  Use Yes --        Goals/Expected Outcomes   Short Term Goals To learn and demonstrate proper pursed lip breathing techniques or other breathing techniques. ;To learn and understand importance of monitoring SPO2 with pulse oximeter and demonstrate accurate use of the pulse oximeter.;To learn and understand importance of maintaining oxygen  saturations>88% To learn and demonstrate proper pursed lip breathing techniques or other breathing techniques.       Long  Term Goals Maintenance of O2 saturations>88%;Exhibits proper breathing techniques, such as pursed lip breathing or other method taught during program session;Compliance with respiratory medication;Verbalizes importance of monitoring SPO2 with pulse oximeter and return demonstration Exhibits proper breathing techniques, such as pursed lip breathing or other method taught during program session      Comments Reviewed PLB technique with pt.  Talked about how it works and it's importance in maintaining their exercise saturations. Informed patient how to perform the Pursed Lipped breathing technique. Told patient to Inhale through the nose and out  the mouth with pursed lips to keep their airways open, help oxygenate them better, practice when at rest or doing strenuous activity. Patient Verbalizes understanding of technique and will work on and be reiterated during [facility].      Goals/Expected Outcomes Short: Become more profiecient at using PLB. Long: Become independent at using PLB. Short: use PLB with exertion. Long: use PLB on exertion proficiently and independently.         Oxygen  Discharge (Final Oxygen  Re-Evaluation):  Oxygen  Re-Evaluation - 10/22/23 0939       Program Oxygen  Prescription   Program Oxygen  Prescription None      Home Oxygen    Home Oxygen  Device None    Sleep Oxygen  Prescription None    Home Exercise Oxygen  Prescription None    Home Resting Oxygen  Prescription None      Goals/Expected Outcomes   Short Term Goals To learn and demonstrate proper pursed lip breathing techniques or other breathing techniques.     Long  Term Goals Exhibits proper breathing techniques, such as pursed lip breathing or other method taught during program session  Comments Informed patient how to perform the Pursed Lipped breathing technique. Told patient to Inhale through the nose and out the mouth with pursed lips to keep their airways open, help oxygenate them better, practice when at rest or doing strenuous activity. Patient Verbalizes understanding of technique and will work on and be reiterated during [facility].    Goals/Expected Outcomes Short: use PLB with exertion. Long: use PLB on exertion proficiently and independently.          Initial Exercise Prescription:  Initial Exercise Prescription - 10/03/23 1000       Date of Initial Exercise RX and Referring Provider   Date 10/03/23    Referring Provider Dr. Elna Else      Oxygen    Maintain Oxygen  Saturation 88% or higher      Treadmill   MPH 2.6    Grade 0    Minutes 15    METs 3.46      NuStep   Level 3    SPM 80    Minutes 15    METs 3.46       REL-XR   Level 3    Watts 25    Speed 50    Minutes 15    METs 3.46      Biostep-RELP   Level 3    SPM 80    Minutes 15    METs 3.46      Prescription Details   Duration Progress to 30 minutes of continuous aerobic without signs/symptoms of physical distress      Intensity   THRR 40-80% of Max Heartrate 95-137    Ratings of Perceived Exertion 11-13    Perceived Dyspnea 0-4      Progression   Progression Continue to progress workloads to maintain intensity without signs/symptoms of physical distress.      Resistance Training   Training Prescription Yes    Weight 7lb    Reps 10-15          Perform Capillary Blood Glucose checks as needed.  Exercise Prescription Changes:   Exercise Prescription Changes     Row Name 10/03/23 1000 10/17/23 1500 11/01/23 1000 11/05/23 0900       Response to Exercise   Blood Pressure (Admit) 128/70 128/74 118/70 --    Blood Pressure (Exercise) 142/78 148/70 144/76 --    Blood Pressure (Exit) 134/74 140/86 106/54 --    Heart Rate (Admit) 53 bpm 65 bpm 90 bpm --    Heart Rate (Exercise) 71 bpm 117 bpm 107 bpm --    Heart Rate (Exit) 69 bpm 63 bpm 68 bpm --    Oxygen  Saturation (Admit) 98 % 94 % 92 % --    Oxygen  Saturation (Exercise) 91 % 91 % 93 % --    Oxygen  Saturation (Exit) 91 % 96 % 95 % --    Rating of Perceived Exertion (Exercise) 7 12 14  --    Perceived Dyspnea (Exercise) 0 0 0 --    Symptoms none none none --    Comments results First 2 weeks of exercise sessions -- --    Duration -- Progress to 30 minutes of  aerobic without signs/symptoms of physical distress Progress to 30 minutes of  aerobic without signs/symptoms of physical distress Progress to 30 minutes of  aerobic without signs/symptoms of physical distress    Intensity -- THRR unchanged THRR unchanged THRR unchanged      Progression   Progression -- Continue to progress workloads to maintain intensity  without signs/symptoms of physical distress. Continue to  progress workloads to maintain intensity without signs/symptoms of physical distress. Continue to progress workloads to maintain intensity without signs/symptoms of physical distress.    Average METs -- 2.29 2.74 2.74      Resistance Training   Training Prescription -- Yes Yes Yes    Weight -- 7lb 7lb 7lb    Reps -- 10-15 10-15 10-15      Interval Training   Interval Training -- No No No      Treadmill   MPH -- 1.9 2.5 2.5    Grade -- 0 0 0    Minutes -- 15 15 15     METs -- 2.45 2.91 2.91      REL-XR   Level -- 3 5 5     Minutes -- 15 15 15     METs -- 2.9 3 3       Biostep-RELP   Level -- -- 10 10    Minutes -- -- 15 15    METs -- -- 3 3      Home Exercise Plan   Plans to continue exercise at -- -- -- Home (comment)  walking/leg exercises    Frequency -- -- -- Add 2 additional days to program exercise sessions.    Initial Home Exercises Provided -- -- -- 11/05/23      Oxygen    Maintain Oxygen  Saturation -- 88% or higher 88% or higher 88% or higher       Exercise Comments:   Exercise Comments     Row Name 10/05/23 [phone]           Exercise Comments First full day of exercise!  Patient was oriented to gym and equipment including functions, settings, policies, and procedures.  Patient's individual exercise prescription and treatment plan were reviewed.  All starting workloads were established based on the results of the 6 minute walk test done at initial orientation visit.  The plan for exercise progression was also introduced and progression will be customized based on patient's performance and goals.          Exercise Goals and Review:   Exercise Goals     Row Name 10/03/23 1019             Exercise Goals   Increase Physical Activity Yes       Intervention Provide advice, education, support and counseling about physical activity/exercise needs.;Develop an individualized exercise prescription for aerobic and resistive training based on initial evaluation  findings, risk stratification, comorbidities and participant's personal goals.       Expected Outcomes Short Term: Attend rehab on a regular basis to increase amount of physical activity.;Long Term: Add in home exercise to make exercise part of routine and to increase amount of physical activity.;Long Term: Exercising regularly at least 3-5 days a week.       Increase Strength and Stamina Yes       Intervention Provide advice, education, support and counseling about physical activity/exercise needs.;Develop an individualized exercise prescription for aerobic and resistive training based on initial evaluation findings, risk stratification, comorbidities and participant's personal goals.       Expected Outcomes Short Term: Increase workloads from initial exercise prescription for resistance, speed, and METs.;Short Term: Perform resistance training exercises routinely during rehab and add in resistance training at home;Long Term: Improve cardiorespiratory fitness, muscular endurance and strength as measured by increased METs and functional capacity ( )       Able to understand and use rate of  perceived exertion (RPE) scale Yes       Intervention Provide education and explanation on how to use RPE scale       Expected Outcomes Short Term: Able to use RPE daily in rehab to express subjective intensity level;Long Term:  Able to use RPE to guide intensity level when exercising independently       Able to understand and use Dyspnea scale Yes       Intervention Provide education and explanation on how to use Dyspnea scale       Expected Outcomes Short Term: Able to use Dyspnea scale daily in rehab to express subjective sense of shortness of breath during exertion;Long Term: Able to use Dyspnea scale to guide intensity level when exercising independently       Knowledge and understanding of Target Heart Rate Range (THRR) Yes       Intervention Provide education and explanation of THRR including how the numbers  were predicted and where they are located for reference       Expected Outcomes Short Term: Able to state/look up THRR;Short Term: Able to use daily as guideline for intensity in rehab;Long Term: Able to use THRR to govern intensity when exercising independently       Able to check pulse independently Yes       Intervention Provide education and demonstration on how to check pulse in carotid and radial arteries.;Review the importance of being able to check your own pulse for safety during independent exercise       Expected Outcomes Short Term: Able to explain why pulse checking is important during independent exercise;Long Term: Able to check pulse independently and accurately       Understanding of Exercise Prescription Yes       Intervention Provide education, explanation, and written materials on patient's individual exercise prescription       Expected Outcomes Short Term: Able to explain program exercise prescription;Long Term: Able to explain home exercise prescription to exercise independently          Exercise Goals Re-Evaluation :  Exercise Goals Re-Evaluation     Row Name 10/05/23 0853 10/17/23 1600 11/01/23 1035 11/05/23 0929       Exercise Goal Re-Evaluation   Exercise Goals Review Increase Physical Activity;Able to understand and use rate of perceived exertion (RPE) scale;Knowledge and understanding of Target Heart Rate Range (THRR);Understanding of Exercise Prescription;Increase Strength and Stamina;Able to understand and use Dyspnea scale;Able to check pulse independently Increase Physical Activity;Understanding of Exercise Prescription;Increase Strength and Stamina Increase Physical Activity;Understanding of Exercise Prescription;Increase Strength and Stamina Increase Physical Activity;Able to understand and use rate of perceived exertion (RPE) scale;Knowledge and understanding of Target Heart Rate Range (THRR);Understanding of Exercise Prescription;Increase Strength and  Stamina;Able to understand and use Dyspnea scale;Able to check pulse independently    Comments Reviewed RPE and dyspnea scale, THR and program prescription with pt today.  Pt voiced understanding and was given a copy of goals to take home. Franciszek is off to a good start in the program. He completed his first 2 weeks of exercise sessions in this review. He tolerated his exercise prescription well. He worked on the treadmill at a speed of 1.9 mph with no incline. He also worked at level 3 on the XR. We will continue to monitor his progress in the program. Ren is doing well in rehab. He increased to a workload on the treadmill of a speed of 2.5 mph and no incline. He also increased to level 5 on  the XR and level 10 on the biostep. We will continue to monitor his progress in the program. Reviewed home exercise with pt today from 9:15am to 9:25am.  Pt plans to walk for exercise.  Reviewed THR, pulse, RPE, sign and symptoms, pulse oximetery and when to call 911 or MD.  Also discussed weather considerations and indoor options.  Pt voiced understanding.    Expected Outcomes Short: Use RPE daily to regulate intensity. Long: Follow program prescription in THR. Short: Continue to follow current exercise prescription. Long: Continue exercise to improve strength and stamina. Short: Continue to progressively increase treadmill workload. Long: Continue exercise to improve strength and stamina. Short: add 1-2 days a week of exercise at home on off days of rehab. Long: maintain independent exercise routine upon graduation from pulmonary rehab.       Discharge Exercise Prescription (Final Exercise Prescription Changes):  Exercise Prescription Changes - 11/05/23 0900       Response to Exercise   Duration Progress to 30 minutes of  aerobic without signs/symptoms of physical distress    Intensity THRR unchanged      Progression   Progression Continue to progress workloads to maintain intensity without signs/symptoms  of physical distress.    Average METs 2.74      Resistance Training   Training Prescription Yes    Weight 7lb    Reps 10-15      Interval Training   Interval Training No      Treadmill   MPH 2.5    Grade 0    Minutes 15    METs 2.91      REL-XR   Level 5    Minutes 15    METs 3      Biostep-RELP   Level 10    Minutes 15    METs 3      Home Exercise Plan   Plans to continue exercise at Home (comment)   walking/leg exercises   Frequency Add 2 additional days to program exercise sessions.    Initial Home Exercises Provided 11/05/23      Oxygen    Maintain Oxygen  Saturation 88% or higher          Nutrition:  Target Goals: Understanding of nutrition guidelines, daily intake of sodium 1500mg , cholesterol 200mg , calories 30% from fat and 7% or less from saturated fats, daily to have 5 or more servings of fruits and vegetables.  Education: Nutrition 1 -Group instruction provided by verbal, written material, interactive activities, discussions, models, and posters to present general guidelines for heart healthy nutrition including macronutrients, label reading, and promoting whole foods over processed counterparts. Education serves as Pensions consultant of discussion of heart healthy eating for all. Written material provided at class time.     Education: Nutrition 2 -Group instruction provided by verbal, written material, interactive activities, discussions, models, and posters to present general guidelines for heart healthy nutrition including sodium, cholesterol, and saturated fat. Providing guidance of habit forming to improve blood pressure, cholesterol, and body weight. Written material provided at class time.     Biometrics:  Pre Biometrics - 10/03/23 1020       Pre Biometrics   Height 6' 1.1 (1.857 m)    Weight 194 lb 12.8 oz (88.4 kg)    Waist Circumference 36 inches    Hip Circumference 39 inches    Waist to Hip Ratio 0.92 %    BMI (Calculated) 25.62     Single Leg Stand 17.5 seconds  Nutrition Therapy Plan and Nutrition Goals:   Nutrition Assessments:  MEDIFICTS Score Key: >=70 Need to make dietary changes  40-70 Heart Healthy Diet <= 40 Therapeutic Level Cholesterol Diet  Flowsheet Row Pulmonary Rehab from 10/03/2023 in [facility] Cardiac and Pulmonary Rehab  Picture Your Plate Total Score on Admission 44   Picture Your Plate Scores: <59 Unhealthy dietary pattern with much room for improvement. 41-50 Dietary pattern unlikely to meet recommendations for good health and room for improvement. 51-60 More healthful dietary pattern, with some room for improvement.  >60 Healthy dietary pattern, although there may be some specific behaviors that could be improved.   Nutrition Goals Re-Evaluation:  Nutrition Goals Re-Evaluation     Row Name 10/22/23 [phone]             Goals   Comment Patient was informed on why it is important to maintain a balanced diet when dealing with Respiratory issues. Explained that it takes a lot of energy to breath and when they are short of breath often they will need to have a good diet to help keep up with the calories they are expending for breathing.       Expected Outcome Short: Choose and plan snacks accordingly to patients caloric intake to improve breathing. Long: Maintain a diet independently that meets their caloric intake to aid in daily shortness of breath.          Nutrition Goals Discharge (Final Nutrition Goals Re-Evaluation):  Nutrition Goals Re-Evaluation - 10/22/23 9061       Goals   Comment Patient was informed on why it is important to maintain a balanced diet when dealing with Respiratory issues. Explained that it takes a lot of energy to breath and when they are short of breath often they will need to have a good diet to help keep up with the calories they are expending for breathing.    Expected Outcome Short: Choose and plan snacks accordingly to patients caloric intake to  improve breathing. Long: Maintain a diet independently that meets their caloric intake to aid in daily shortness of breath.          Psychosocial: Target Goals: Acknowledge presence or absence of significant depression and/or stress, maximize coping skills, provide positive support system. Participant is able to verbalize types and ability to use techniques and skills needed for reducing stress and depression.   Education: Stress, Anxiety, and Depression - Group verbal and visual presentation to define topics covered.  Reviews how body is impacted by stress, anxiety, and depression.  Also discusses healthy ways to reduce stress and to treat/manage anxiety and depression.  Written material provided at class time. Flowsheet Row Pulmonary Rehab from 11/07/2023 in [facility] Cardiac and Pulmonary Rehab  Date 11/07/23  Educator kb  Instruction Review Code 1- [employer] Understanding    Education: Sleep Hygiene -Provides group verbal and written instruction about how sleep can affect your health.  Define sleep hygiene, discuss sleep cycles and impact of sleep habits. Review good sleep hygiene tips.    Initial Review & Psychosocial Screening:  Initial Psych Review & Screening - 09/28/23 1040       Initial Review   Current issues with Current Stress Concerns    Source of Stress Concerns Chronic Illness      Family Dynamics   Good Support System? Yes      Barriers   Psychosocial barriers to participate in program There are no identifiable barriers or psychosocial needs.      Screening  Interventions   Interventions Encouraged to exercise;To provide support and resources with identified psychosocial needs;Provide feedback about the scores to participant    Expected Outcomes Short Term goal: Utilizing psychosocial counselor, staff and physician to assist with identification of specific Stressors or current issues interfering with healing process. Setting desired goal for each stressor or current  issue identified.;Long Term Goal: Stressors or current issues are controlled or eliminated.;Short Term goal: Identification and review with participant of any Quality of Life or Depression concerns found by scoring the questionnaire.;Long Term goal: The participant improves quality of Life and PHQ9 Scores as seen by post scores and/or verbalization of changes          Quality of Life Scores:  Scores of 19 and below usually indicate a poorer quality of life in these areas.  A difference of  2-3 points is a clinically meaningful difference.  A difference of 2-3 points in the total score of the Quality of Life Index has been associated with significant improvement in overall quality of life, self-image, physical symptoms, and general health in studies assessing change in quality of life.  PHQ-9: Review Flowsheet  More data exists      10/22/2023 12/14/2022 10/31/2022 04/25/2022 10/05/2020  Depression screen PHQ 2/9  Decreased Interest 0 0 2 0 0  Down, Depressed, Hopeless 0 0 0 0 0  PHQ - 2 Score 0 0 2 0 0  Altered sleeping 0 0 1 0 -  Tired, decreased energy 0 2 2 1  -  Change in appetite 0 0 2 0 -  Feeling bad or failure about yourself  0 0 0 0 -  Trouble concentrating 0 0 0 0 -  Moving slowly or fidgety/restless 0 0 0 1 -  Suicidal thoughts 0 0 0 0 -  PHQ-9 Score 0 2 7 2  -  Difficult doing work/chores Not difficult at all Not difficult at all - - -   Interpretation of Total Score  Total Score Depression Severity:  1-4 = Minimal depression, 5-9 = Mild depression, 10-14 = Moderate depression, 15-19 = Moderately severe depression, 20-27 = Severe depression   Psychosocial Evaluation and Intervention:  Psychosocial Evaluation - 09/28/23 1056       Psychosocial Evaluation & Interventions   Interventions Encouraged to exercise with the program and follow exercise prescription;Stress management education;Relaxation education    Comments Mr. Coller is coming to pulmonary rehab. He mentions  that his transplant team wants him to complete the program in preparation for a kidney transplant work up. 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 He reports his biggest stress concern is how he is going to do exercising given his long covid symptoms versus renal symptoms. He has done the program before, so he knows what to expect. He does state his stress level has been higher thinking about attending the program, but he is wanting to attend to help prepare him for transplant work up.    Expected Outcomes Short: attend pulmonary rehab for education and exercise Long: develop and maintain positive self care habits.    Continue Psychosocial Services  Follow up required by staff          Psychosocial Re-Evaluation:  Psychosocial Re-Evaluation     Row Name 10/22/23 [phone]             Psychosocial Re-Evaluation   Current issues with None Identified       Comments Reviewed patient health questionnaire (PHQ-9) with patient for follow up. Previously,  patients score indicated signs/symptoms of depression.  Reviewed to see if patient is improving symptom wise while in program.  Initial PHQ done since the patient has restarteds Rehab since the last score.       Expected Outcomes Short: Continue to attend [facility] regularly for regular exercise and social engagement. Long: Continue to improve symptoms and manage a positive mental state.       Interventions Encouraged to attend Pulmonary Rehabilitation for the exercise       Continue Psychosocial Services  Follow up required by staff          Psychosocial Discharge (Final Psychosocial Re-Evaluation):  Psychosocial Re-Evaluation - 10/22/23 0942       Psychosocial Re-Evaluation   Current issues with None Identified    Comments Reviewed patient health questionnaire (PHQ-9) with patient for follow up. Previously, patients score indicated signs/symptoms of depression.  Reviewed to see if patient is improving symptom wise  while in program.  Initial PHQ done since the patient has restarteds Rehab since the last score.    Expected Outcomes Short: Continue to attend [facility] regularly for regular exercise and social engagement. Long: Continue to improve symptoms and manage a positive mental state.    Interventions Encouraged to attend Pulmonary Rehabilitation for the exercise    Continue Psychosocial Services  Follow up required by staff          Education: Education Goals: Education classes will be provided on a weekly basis, covering required topics. Participant will state understanding/return demonstration of topics presented.  Learning Barriers/Preferences:  Learning Barriers/Preferences - 09/28/23 1039       Learning Barriers/Preferences   Learning Barriers Exercise Concerns    Learning Preferences None          General Pulmonary Education Topics:  Infection Prevention: - Provides verbal and written material to individual with discussion of infection control including proper hand washing and proper equipment cleaning during exercise session. Flowsheet Row Pulmonary Rehab from 11/07/2023 in [facility] Cardiac and Pulmonary Rehab  Date 10/03/23  Educator [facility]  Instruction Review Code 1- Verbalizes Understanding    Falls Prevention: - Provides verbal and written material to individual with discussion of falls prevention and safety. Flowsheet Row Pulmonary Rehab from 11/07/2023 in [facility] Cardiac and Pulmonary Rehab  Date 10/03/23  Educator [facility]  Instruction Review Code 1- Verbalizes Understanding    Chronic Lung Disease Review: - Group verbal instruction with posters, models, PowerPoint presentations and videos,  to review new updates, new respiratory medications, new advancements in procedures and treatments. Providing information on websites and 800 numbers for continued self-education. Includes information about supplement oxygen , available portable oxygen  systems, continuous and intermittent flow  rates, oxygen  safety, concentrators, and Medicare reimbursement for oxygen . Explanation of Pulmonary Drugs, including class, frequency, complications, importance of spacers, rinsing mouth after steroid MDI's, and proper cleaning methods for nebulizers. Review of basic lung anatomy and physiology related to function, structure, and complications of lung disease. Review of risk factors. Discussion about methods for diagnosing sleep apnea and types of masks and machines for OSA. Includes a review of the use of types of environmental controls: home humidity, furnaces, filters, dust mite/pet prevention, HEPA vacuums. Discussion about weather changes, air quality and the benefits of nasal washing. Instruction on Warning signs, infection symptoms, calling MD promptly, preventive modes, and value of vaccinations. Review of effective airway clearance, coughing and/or vibration techniques. Emphasizing that all should Create an Action Plan. Written material provided at class time. Flowsheet Row Pulmonary Rehab from 11/07/2023 in  [facility] Cardiac and Pulmonary Rehab  Education need identified 10/03/23  Date 10/31/23  Educator jh  Instruction Review Code 1- Verbalizes Understanding    AED/CPR: - Group verbal and written instruction with the use of models to demonstrate the basic use of the AED with the basic ABC's of resuscitation.    Tests and Procedures:  - Group verbal and visual presentation and models provide information about basic cardiac anatomy and function. Reviews the testing methods done to diagnose heart disease and the outcomes of the test results. Describes the treatment choices: Medical Management, Angioplasty, or Coronary Bypass Surgery for treating various heart conditions including Myocardial Infarction, Angina, Valve Disease, and Cardiac Arrhythmias.  Written material provided at class time. Flowsheet Row Pulmonary Rehab from 11/07/2023 in [facility] Cardiac and Pulmonary Rehab  Date 10/10/23  Educator  KB  Instruction Review Code 1- Verbalizes Understanding    Medication Safety: - Group verbal and visual instruction to review commonly prescribed medications for heart and lung disease. Reviews the medication, class of the drug, and side effects. Includes the steps to properly store meds and maintain the prescription regimen.  Written material given at graduation.   Other: -Provides group and verbal instruction on various topics (see comments)   Knowledge Questionnaire Score:  Knowledge Questionnaire Score - 10/05/23 1026       Knowledge Questionnaire Score   Pre Score 12/18           Core Components/Risk Factors/Patient Goals at Admission:  Personal Goals and Risk Factors at Admission - 09/28/23 1039       Core Components/Risk Factors/Patient Goals on Admission   Improve shortness of breath with ADL's Yes    Intervention Provide education, individualized exercise plan and daily activity instruction to help decrease symptoms of SOB with activities of daily living.    Expected Outcomes Short Term: Improve cardiorespiratory fitness to achieve a reduction of symptoms when performing ADLs;Long Term: Be able to perform more ADLs without symptoms or delay the onset of symptoms    Heart Failure Yes    Intervention Provide a combined exercise and nutrition program that is supplemented with education, support and counseling about heart failure. Directed toward relieving symptoms such as shortness of breath, decreased exercise tolerance, and extremity edema.    Expected Outcomes Improve functional capacity of life;Short term: Attendance in program 2-3 days a week with increased exercise capacity. Reported lower sodium intake. Reported increased fruit and vegetable intake. Reports medication compliance.;Short term: Daily weights obtained and reported for increase. Utilizing diuretic protocols set by physician.;Long term: Adoption of self-care skills and reduction of barriers for early signs and  symptoms recognition and intervention leading to self-care maintenance.    Hypertension Yes    Intervention Provide education on lifestyle modifcations including regular physical activity/exercise, weight management, moderate sodium restriction and increased consumption of fresh fruit, vegetables, and low fat dairy, alcohol moderation, and smoking cessation.;Monitor prescription use compliance.    Expected Outcomes Short Term: Continued assessment and intervention until BP is < 140/28mm HG in hypertensive participants. < 130/71mm HG in hypertensive participants with diabetes, heart failure or chronic kidney disease.;Long Term: Maintenance of blood pressure at goal levels.          Education:Diabetes - Individual verbal and written instruction to review signs/symptoms of diabetes, desired ranges of glucose level fasting, after meals and with exercise. Acknowledge that pre and post exercise glucose checks will be done for 3 sessions at entry of program.   Know Your Numbers and Heart Failure: -  Group verbal and visual instruction to discuss disease risk factors for cardiac and pulmonary disease and treatment options.  Reviews associated critical values for Overweight/Obesity, Hypertension, Cholesterol, and Diabetes.  Discusses basics of heart failure: signs/symptoms and treatments.  Introduces Heart Failure Zone chart for action plan for heart failure. Written material provided at class time. Flowsheet Row Pulmonary Rehab from 11/07/2023 in [facility] Cardiac and Pulmonary Rehab  Date 10/24/23  Educator KB  Instruction Review Code 1- Verbalizes Understanding    Core Components/Risk Factors/Patient Goals Review:   Goals and Risk Factor Review     Row Name 10/22/23 [phone]             Core Components/Risk Factors/Patient Goals Review   Personal Goals Review Improve shortness of breath with ADL's       Review Spoke to patient about their shortness of breath and what they can do to improve. Patient  has been informed of breathing techniques when starting the program. Patient is informed to tell staff if they have had any med changes and that certain meds they are taking or not taking can be causing shortness of breath.       Expected Outcomes Short: Attend [facility] regularly to improve shortness of breath with ADL's. Long: maintain independence with ADL's          Core Components/Risk Factors/Patient Goals at Discharge (Final Review):   Goals and Risk Factor Review - 10/22/23 0938       Core Components/Risk Factors/Patient Goals Review   Personal Goals Review Improve shortness of breath with ADL's    Review Spoke to patient about their shortness of breath and what they can do to improve. Patient has been informed of breathing techniques when starting the program. Patient is informed to tell staff if they have had any med changes and that certain meds they are taking or not taking can be causing shortness of breath.    Expected Outcomes Short: Attend [facility] regularly to improve shortness of breath with ADL's. Long: maintain independence with ADL's          ITP Comments:  ITP Comments     Row Name 09/28/23 1038 10/03/23 1009 10/05/23 0853 10/10/23 1003 11/07/23 1449   ITP Comments Initial phone call completed. Diagnosis can be found in [facility] 7/25. EP Orientation scheduled for Wednesday 8/20 at 9am. Completed and gym orientation for pulmonary rehab. Initial ITP created and sent for review to Dr. Faud Aleskerov, Medical Director. First full day of exercise!  Patient was oriented to gym and equipment including functions, settings, policies, and procedures.  Patient's individual exercise prescription and treatment plan were reviewed.  All starting workloads were established based on the results of the 6 minute walk test done at initial orientation visit.  The plan for exercise progression was also introduced and progression will be customized based on patient's performance and goals. 30  Day review completed. Medical Director ITP review done; changes made as directed and signed approval by Medical Director. New to program. 30 Day review completed. Medical Director ITP review done; changes made as directed and signed approval by Medical Director.      Comments: 30 day review

## 2023-11-09 ENCOUNTER — Encounter

## 2023-11-09 DIAGNOSIS — J439 Emphysema, unspecified: Secondary | ICD-10-CM | POA: Diagnosis not present

## 2023-11-09 NOTE — Progress Notes (Signed)
 Daily Session Note  Patient Details  Name: Louis [name] MRN: [id number] Date of Birth: 12-May-1960 Referring Provider:   Conrad Ports Pulmonary Rehab from 10/03/2023 in [facility] Cardiac and Pulmonary Rehab  Referring Provider Dr. Elna Else    Encounter Date: 11/09/2023  Check In:  Session Check In - 11/09/23 0904       Check-In   Supervising physician immediately available to respond to emergencies See telemetry face sheet for immediately available ER MD    Location [facility]-Cardiac & Pulmonary Rehab    Staff Present Burnard Davenport RN,BSN,MPA;Joseph Hood RCP,RRT,BSRT;Noah Tickle, [place], Exercise Physiologist;Maxon Conetta BS, Exercise Physiologist    Virtual Visit No    Medication changes reported     No    Fall or balance concerns reported    No    Tobacco Cessation No Change    Warm-up and Cool-down Performed on first and last piece of equipment    Resistance Training Performed Yes    VAD Patient? No    PAD/SET Patient? No      Pain Assessment   Currently in Pain? No/denies             Social History   Tobacco Use  Smoking Status Never  Smokeless Tobacco Never    Goals Met:  Proper associated with RPD/PD & O2 Sat Independence with exercise equipment Using PLB without cueing & demonstrates good technique Exercise tolerated well No report of concerns or symptoms today Strength training completed today  Goals Unmet:  Not Applicable  Comments: Pt able to follow exercise prescription today without complaint.  Will continue to monitor for progression.    Dr. Oneil Pinal is Medical Director for [facility] Cardiac Rehabilitation.  Dr. Fuad Aleskerov is Medical Director for [facility] Pulmonary Rehabilitation.

## 2023-11-12 ENCOUNTER — Encounter

## 2023-11-12 DIAGNOSIS — J439 Emphysema, unspecified: Secondary | ICD-10-CM

## 2023-11-12 NOTE — Progress Notes (Signed)
 Daily Session Note  Patient Details  Name: Louis [name] MRN: [id number] Date of Birth: Sep 21, 1960 Referring Provider:   Conrad Ports Pulmonary Rehab from 10/03/2023 in [facility] Cardiac and Pulmonary Rehab  Referring Provider Dr. Elna Else    Encounter Date: 11/12/2023  Check In:  Session Check In - 11/12/23 0909       Check-In   Supervising physician immediately available to respond to emergencies See telemetry face sheet for immediately available ER MD    Location [facility]-Cardiac & Pulmonary Rehab    Staff Present Burnard Davenport [facility] Peggi, RN, DNP, NE-BC;Joseph [facility] BS, ACSM CEP, Exercise Physiologist;Maxon Conetta BS, Exercise Physiologist    Virtual Visit No    Medication changes reported     No    Fall or balance concerns reported    No    Tobacco Cessation No Change    Warm-up and Cool-down Performed on first and last piece of equipment    Resistance Training Performed Yes    VAD Patient? No    PAD/SET Patient? No      Pain Assessment   Currently in Pain? No/denies             Social History   Tobacco Use  Smoking Status Never  Smokeless Tobacco Never    Goals Met:  Proper associated with RPD/PD & O2 Sat Independence with exercise equipment Using PLB without cueing & demonstrates good technique Exercise tolerated well No report of concerns or symptoms today Strength training completed today  Goals Unmet:  Not Applicable  Comments: Pt able to follow exercise prescription today without complaint.  Will continue to monitor for progression.    Dr. Oneil Pinal is Medical Director for [facility] Cardiac Rehabilitation.  Dr. Fuad Aleskerov is Medical Director for [facility] Pulmonary Rehabilitation.

## 2023-11-14 ENCOUNTER — Encounter: Attending: Internal Medicine

## 2023-11-14 DIAGNOSIS — I5022 Chronic systolic (congestive) heart failure: Secondary | ICD-10-CM | POA: Insufficient documentation

## 2023-11-14 DIAGNOSIS — J439 Emphysema, unspecified: Secondary | ICD-10-CM | POA: Insufficient documentation

## 2023-11-14 NOTE — Progress Notes (Signed)
 Daily Session Note  Patient Details  Name: Louis [name] MRN: [id number] Date of Birth: Nov 13, 1960 Referring Provider:   Conrad Ports Pulmonary Rehab from 10/03/2023 in [facility] Cardiac and Pulmonary Rehab  Referring Provider Dr. Elna Else    Encounter Date: 11/14/2023  Check In:  Session Check In - 11/14/23 0906       Check-In   Supervising physician immediately available to respond to emergencies See telemetry face sheet for immediately available ER MD    Location [facility]-Cardiac & Pulmonary Rehab    Staff Present Burnard Davenport [facility] Peggi, RN, DNP, NE-BC;Joseph [facility] Best, MS, Exercise Physiologist;Laura Cates RN,BSN    Virtual Visit No    Medication changes reported     No    Fall or balance concerns reported    No    Tobacco Cessation No Change    Warm-up and Cool-down Performed on first and last piece of equipment    Resistance Training Performed Yes    VAD Patient? No    PAD/SET Patient? No      Pain Assessment   Currently in Pain? No/denies             Social History   Tobacco Use  Smoking Status Never  Smokeless Tobacco Never    Goals Met:  Proper associated with RPD/PD & O2 Sat Independence with exercise equipment Using PLB without cueing & demonstrates good technique Exercise tolerated well No report of concerns or symptoms today Strength training completed today  Goals Unmet:  Not Applicable  Comments: Pt able to follow exercise prescription today without complaint.  Will continue to monitor for progression.    Dr. Oneil Pinal is Medical Director for [facility] Cardiac Rehabilitation.  Dr. Fuad Aleskerov is Medical Director for [facility] Pulmonary Rehabilitation.

## 2023-11-16 ENCOUNTER — Encounter

## 2023-11-16 DIAGNOSIS — J439 Emphysema, unspecified: Secondary | ICD-10-CM | POA: Diagnosis not present

## 2023-11-16 NOTE — Progress Notes (Signed)
 Daily Session Note  Patient Details  Name: Louis [name] MRN: [id number] Date of Birth: [birth year] Referring Provider:   Conrad Ports Pulmonary Rehab from 10/03/2023 in [facility] Cardiac and Pulmonary Rehab  Referring Provider Dr. Elna Else    Encounter Date: 11/16/2023  Check In:  Session Check In - 11/16/23 0906       Check-In   Supervising physician immediately available to respond to emergencies See telemetry face sheet for immediately available ER MD    Location [facility]-Cardiac & Pulmonary Rehab    Staff Present Burnard Davenport RN,BSN,MPA;Laura Cates RN,BSN;Joseph [facility] BS, Exercise Physiologist    Virtual Visit No    Medication changes reported     No    Fall or balance concerns reported    No    Tobacco Cessation No Change    Warm-up and Cool-down Performed on first and last piece of equipment    Resistance Training Performed Yes    VAD Patient? No    PAD/SET Patient? No      Pain Assessment   Currently in Pain? No/denies             Social History   Tobacco Use  Smoking Status Never  Smokeless Tobacco Never    Goals Met:  Proper associated with RPD/PD & O2 Sat Independence with exercise equipment Using PLB without cueing & demonstrates good technique Exercise tolerated well No report of concerns or symptoms today Strength training completed today  Goals Unmet:  Not Applicable  Comments: Pt able to follow exercise prescription today without complaint.  Will continue to monitor for progression.    Dr. Oneil Pinal is Medical Director for [facility] Cardiac Rehabilitation.  Dr. Fuad Aleskerov is Medical Director for [facility] Pulmonary Rehabilitation.

## 2023-11-19 ENCOUNTER — Encounter

## 2023-11-19 DIAGNOSIS — J439 Emphysema, unspecified: Secondary | ICD-10-CM | POA: Diagnosis not present

## 2023-11-19 NOTE — Progress Notes (Signed)
 Daily Session Note  Patient Details  Name: Louis [name] MRN: [id number] Date of Birth: 1961-01-31 Referring Provider:   Conrad Ports Pulmonary Rehab from 10/03/2023 in [facility] Cardiac and Pulmonary Rehab  Referring Provider Dr. Elna Else    Encounter Date: 11/19/2023  Check In:  Session Check In - 11/19/23 0908       Check-In   Supervising physician immediately available to respond to emergencies See telemetry face sheet for immediately available ER MD    Location [facility]-Cardiac & Pulmonary Rehab    Staff Present Burnard Davenport RN,BSN,MPA;Joseph [facility] RCP,RRT,BSRT;Maxon Burnell BS, Exercise Physiologist;Marymargaret Kirker Dyane BS, ACSM CEP, Exercise Physiologist    Virtual Visit No    Medication changes reported     No    Fall or balance concerns reported    No    Tobacco Cessation No Change    Warm-up and Cool-down Performed on first and last piece of equipment    Resistance Training Performed Yes    VAD Patient? No    PAD/SET Patient? No      Pain Assessment   Currently in Pain? No/denies             Social History   Tobacco Use  Smoking Status Never  Smokeless Tobacco Never    Goals Met:  Proper associated with RPD/PD & O2 Sat Independence with exercise equipment Using PLB without cueing & demonstrates good technique Exercise tolerated well No report of concerns or symptoms today Strength training completed today  Goals Unmet:  Not Applicable  Comments: Pt able to follow exercise prescription today without complaint.  Will continue to monitor for progression.    Dr. Oneil Pinal is Medical Director for [facility] Cardiac Rehabilitation.  Dr. Fuad Aleskerov is Medical Director for [facility] Pulmonary Rehabilitation.

## 2023-11-21 ENCOUNTER — Encounter

## 2023-11-21 DIAGNOSIS — J439 Emphysema, unspecified: Secondary | ICD-10-CM | POA: Diagnosis not present

## 2023-11-21 NOTE — Progress Notes (Signed)
 Daily Session Note  Patient Details  Name: Louis [name] MRN: [id number] Date of Birth: [birth year] Referring Provider:   Conrad Ports [facility]monary Rehab from 10/03/2023 in [facility] Cardiac and Pulmonary Rehab  Referring Provider Dr. Elna Else    Encounter Date: 11/21/2023  Check In:  Session Check In - 11/21/23 0937       Check-In   Supervising physician immediately available to respond to emergencies See telemetry face sheet for immediately available ER MD    Location [facility]-Cardiac & Pulmonary Rehab    Staff Present Burnard Davenport [facility] Peggi, RN, DNP, NE-BC;Joseph [facility] RN,BSN;Margaret Best, MS, Exercise Physiologist;Rhaelyn Giron Dyane BS, ACSM CEP, Exercise Physiologist    Virtual Visit No    Medication changes reported     No    Fall or balance concerns reported    No    Tobacco Cessation No Change    Warm-up and Cool-down Performed on first and last piece of equipment    Resistance Training Performed Yes    VAD Patient? No    PAD/SET Patient? No      Pain Assessment   Currently in Pain? No/denies             Social History   Tobacco Use  Smoking Status Never  Smokeless Tobacco Never    Goals Met:  Proper associated with RPD/PD & O2 Sat Independence with exercise equipment Using PLB without cueing & demonstrates good technique Exercise tolerated well No report of concerns or symptoms today Strength training completed today  Goals Unmet:  Not Applicable  Comments: Pt able to follow exercise prescription today without complaint.  Will continue to monitor for progression.    Dr. Oneil Pinal is Medical Director for [facility] Cardiac Rehabilitation.  Dr. Fuad Aleskerov is Medical Director for [facility] Pulmonary Rehabilitation.

## 2023-11-23 ENCOUNTER — Encounter: Admitting: *Deleted

## 2023-11-23 DIAGNOSIS — J439 Emphysema, unspecified: Secondary | ICD-10-CM

## 2023-11-23 DIAGNOSIS — I5022 Chronic systolic (congestive) heart failure: Secondary | ICD-10-CM

## 2023-11-23 NOTE — Progress Notes (Signed)
 Daily Session Note  Patient Details  Name: Louis [name] MRN: [id number] Date of Birth: 1960/11/20 Referring Provider:   Conrad Ports Pulmonary Rehab from 10/03/2023 in [facility] Cardiac and Pulmonary Rehab  Referring Provider Dr. Elna Else    Encounter Date: 11/23/2023  Check In:  Session Check In - 11/23/23 0924       Check-In   Supervising physician immediately available to respond to emergencies See telemetry face sheet for immediately available ER MD    Location [facility]-Cardiac & Pulmonary Rehab    Staff Present Ronal Picket, RN, DNP, NE-BC;Noah Tickle, BS, Exercise Physiologist;Christan Defranco RN,BSN;Maxon Conetta BS, Exercise Physiologist    Virtual Visit No    Medication changes reported     No    Fall or balance concerns reported    No    Tobacco Cessation No Change    Warm-up and Cool-down Performed on first and last piece of equipment    Resistance Training Performed Yes    VAD Patient? No    PAD/SET Patient? No      Pain Assessment   Currently in Pain? No/denies             Social History   Tobacco Use  Smoking Status Never  Smokeless Tobacco Never    Goals Met:  Proper associated with RPD/PD & O2 Sat Independence with exercise equipment Exercise tolerated well No report of concerns or symptoms today Strength training completed today  Goals Unmet:  Not Applicable  Comments: Pt able to follow exercise prescription today without complaint.  Will continue to monitor for progression.    Dr. Oneil Pinal is Medical Director for [facility] Cardiac Rehabilitation.  Dr. Fuad Aleskerov is Medical Director for [facility] Pulmonary Rehabilitation.

## 2023-11-26 ENCOUNTER — Encounter

## 2023-11-26 DIAGNOSIS — J439 Emphysema, unspecified: Secondary | ICD-10-CM | POA: Diagnosis not present

## 2023-11-26 NOTE — Progress Notes (Signed)
 Daily Session Note  Patient Details  Name: KARSTEN HOWRY MRN: [id number] Date of Birth: 16-Jan-1961 Referring Provider:   Conrad Ports Pulmonary Rehab from 10/03/2023 in [facility] Cardiac and Pulmonary Rehab  Referring Provider Dr. Elna Else    Encounter Date: 11/26/2023  Check In:  Session Check In - 11/26/23 0905       Check-In   Supervising physician immediately available to respond to emergencies See telemetry face sheet for immediately available ER MD    Location [facility]-Cardiac & Pulmonary Rehab    Staff Present Burnard Davenport RN,BSN,MPA;Joseph [facility] RCP,RRT,BSRT;Oiva Dibari Dyane BS, ACSM CEP, Exercise Physiologist;Maxon Conetta BS, Exercise Physiologist    Virtual Visit No    Medication changes reported     No    Fall or balance concerns reported    No    Tobacco Cessation No Change    Warm-up and Cool-down Performed on first and last piece of equipment    Resistance Training Performed Yes    VAD Patient? No    PAD/SET Patient? No      Pain Assessment   Currently in Pain? No/denies             Social History   Tobacco Use  Smoking Status Never  Smokeless Tobacco Never    Goals Met:  Proper associated with RPD/PD & O2 Sat Independence with exercise equipment Using PLB without cueing & demonstrates good technique Exercise tolerated well Personal goals reviewed No report of concerns or symptoms today Strength training completed today  Goals Unmet:  Not Applicable  Comments: Pt able to follow exercise prescription today without complaint.  Will continue to monitor for progression.    Dr. Oneil Pinal is Medical Director for [facility] Cardiac Rehabilitation.  Dr. Fuad Aleskerov is Medical Director for [facility] Pulmonary Rehabilitation.

## 2023-11-27 DIAGNOSIS — Z01810 Encounter for preprocedural cardiovascular examination: Principal | ICD-10-CM

## 2023-11-27 DIAGNOSIS — R93429 Abnormal radiologic findings on diagnostic imaging of unspecified kidney: Principal | ICD-10-CM

## 2023-11-27 DIAGNOSIS — Z21 Asymptomatic human immunodeficiency virus [HIV] infection status: Principal | ICD-10-CM

## 2023-11-27 DIAGNOSIS — Z125 Encounter for screening for malignant neoplasm of prostate: Principal | ICD-10-CM

## 2023-11-27 DIAGNOSIS — N186 End stage renal disease: Principal | ICD-10-CM

## 2023-11-27 DIAGNOSIS — Z7289 Other problems related to lifestyle: Principal | ICD-10-CM

## 2023-11-27 DIAGNOSIS — Z01818 Encounter for other preprocedural examination: Principal | ICD-10-CM

## 2023-11-28 ENCOUNTER — Encounter

## 2023-11-28 DIAGNOSIS — J439 Emphysema, unspecified: Secondary | ICD-10-CM

## 2023-11-28 NOTE — Progress Notes (Signed)
 Daily Session Note  Patient Details  Name: ERVIE MCCARD MRN: [id number] Date of Birth: [birth year] Referring Provider:   Conrad Ports Pulmonary Rehab from 10/03/2023 in [facility] Cardiac and Pulmonary Rehab  Referring Provider Dr. Elna Else    Encounter Date: 11/28/2023  Check In:  Session Check In - 11/28/23 9167       Check-In   Supervising physician immediately available to respond to emergencies See telemetry face sheet for immediately available ER MD    Location [facility]-Cardiac & Pulmonary Rehab    Staff Present Burnard Davenport RN,BSN,MPA;Joseph [facility] RCP,RRT,BSRT;Margaret Best, MS, Exercise Physiologist;Jason Elnor RDN,LDN    Virtual Visit No    Medication changes reported     No    Fall or balance concerns reported    No    Tobacco Cessation No Change    Warm-up and Cool-down Performed on first and last piece of equipment    Resistance Training Performed Yes    VAD Patient? No    PAD/SET Patient? No      Pain Assessment   Currently in Pain? No/denies             Social History   Tobacco Use  Smoking Status Never  Smokeless Tobacco Never    Goals Met:  Proper associated with RPD/PD & O2 Sat Independence with exercise equipment Using PLB without cueing & demonstrates good technique Exercise tolerated well No report of concerns or symptoms today Strength training completed today  Goals Unmet:  Not Applicable  Comments: Pt able to follow exercise prescription today without complaint.  Will continue to monitor for progression.    Dr. Oneil Pinal is Medical Director for [facility] Cardiac Rehabilitation.  Dr. Fuad Aleskerov is Medical Director for [facility] Pulmonary Rehabilitation.

## 2023-11-30 ENCOUNTER — Encounter

## 2023-11-30 DIAGNOSIS — J439 Emphysema, unspecified: Secondary | ICD-10-CM

## 2023-11-30 NOTE — Progress Notes (Signed)
 Daily Session Note  Patient Details  Name: Louis [name] MRN: [id number] Date of Birth: [birth year] Referring Provider:   Conrad Ports Pulmonary Rehab from 10/03/2023 in [facility] Cardiac and Pulmonary Rehab  Referring Provider Dr. Elna Else    Encounter Date: 11/30/2023  Check In:  Session Check In - 11/30/23 0917       Check-In   Supervising physician immediately available to respond to emergencies See telemetry face sheet for immediately available ER MD    Location [facility]-Cardiac & Pulmonary Rehab    Staff Present Burnard Davenport RN,BSN,MPA;Maxon Conetta BS, Exercise Physiologist;Noah Tickle, BS, Exercise Physiologist;Laureen Delores, BS, RRT, CPFT    Virtual Visit No    Medication changes reported     No    Fall or balance concerns reported    No    Tobacco Cessation No Change    Warm-up and Cool-down Performed on first and last piece of equipment    Resistance Training Performed Yes    VAD Patient? No    PAD/SET Patient? No      Pain Assessment   Currently in Pain? No/denies             Social History   Tobacco Use  Smoking Status Never  Smokeless Tobacco Never    Goals Met:  Proper associated with RPD/PD & O2 Sat Independence with exercise equipment Using PLB without cueing & demonstrates good technique Exercise tolerated well No report of concerns or symptoms today Strength training completed today  Goals Unmet:  Not Applicable  Comments: Pt able to follow exercise prescription today without complaint.  Will continue to monitor for progression.    Dr. Oneil Pinal is Medical Director for [facility] Cardiac Rehabilitation.  Dr. Fuad Aleskerov is Medical Director for [facility] Pulmonary Rehabilitation.

## 2023-12-03 ENCOUNTER — Encounter

## 2023-12-03 DIAGNOSIS — J439 Emphysema, unspecified: Secondary | ICD-10-CM | POA: Diagnosis not present

## 2023-12-03 NOTE — Progress Notes (Signed)
 Daily Session Note  Patient Details  Name: Louis [name] MRN: [id number] Date of Birth: Nov 28, 1960 Referring Provider:   Conrad Ports Pulmonary Rehab from 10/03/2023 in [facility] Cardiac and Pulmonary Rehab  Referring Provider Dr. Elna Else    Encounter Date: 12/03/2023  Check In:  Session Check In - 12/03/23 0902       Check-In   Supervising physician immediately available to respond to emergencies See telemetry face sheet for immediately available ER MD    Location [facility]-Cardiac & Pulmonary Rehab    Staff Present Burnard Davenport RN,BSN,MPA;Maxon Conetta BS, Exercise Physiologist;Haizel Gatchell Dyane HECKLE, ACSM CEP, Exercise Physiologist;Jason Elnor RDN,LDN    Virtual Visit No    Medication changes reported     No    Fall or balance concerns reported    No    Tobacco Cessation No Change    Warm-up and Cool-down Performed on first and last piece of equipment    Resistance Training Performed Yes    VAD Patient? No    PAD/SET Patient? No      Pain Assessment   Currently in Pain? No/denies             Social History   Tobacco Use  Smoking Status Never  Smokeless Tobacco Never    Goals Met:  Proper associated with RPD/PD & O2 Sat Independence with exercise equipment Using PLB without cueing & demonstrates good technique Exercise tolerated well No report of concerns or symptoms today Strength training completed today  Goals Unmet:  Not Applicable  Comments: Pt able to follow exercise prescription today without complaint.  Will continue to monitor for progression.    Dr. Oneil Pinal is Medical Director for [facility] Cardiac Rehabilitation.  Dr. Fuad Aleskerov is Medical Director for [facility] Pulmonary Rehabilitation.

## 2023-12-05 ENCOUNTER — Encounter: Admitting: Emergency Medicine

## 2023-12-05 DIAGNOSIS — J439 Emphysema, unspecified: Secondary | ICD-10-CM | POA: Diagnosis not present

## 2023-12-05 NOTE — Progress Notes (Signed)
 Pulmonary Individual Treatment Plan  Patient Details  Name: Louis [name] MRN: [id number] Date of Birth: [birth year] Referring Provider:   Conrad Ports Pulmonary Rehab from 10/03/2023 in [facility] Cardiac and Pulmonary Rehab  Referring Provider Dr. Elna Else    Initial Encounter Date:  Flowsheet Row Pulmonary Rehab from 10/03/2023 in [facility] Cardiac and Pulmonary Rehab  Date 10/03/23    Visit Diagnosis: Emphysema, unspecified (HCC)  Patient's Home Medications on Admission:  Current Outpatient Medications:    AURYXIA  1 GM 210 MG(Fe) tablet, Take 1 tablet by mouth 4 (four) times daily -  before meals and at bedtime. (Patient not taking: Reported on 10/12/2022), Disp: , Rfl:    B Complex-C-Folic Acid  (RENA-VITE RX) 1 MG TABS, Take 1 tablet by mouth daily. (Patient not taking: Reported on 04/25/2022), Disp: , Rfl:    BIKTARVY 50-200-25 MG TABS tablet, Take 1 tablet by mouth daily., Disp: , Rfl:    calcium  carbonate (TUMS) 500 MG chewable tablet, Chew by mouth. (Patient not taking: Reported on 09/28/2023), Disp: , Rfl:    carvedilol  (COREG ) 12.5 MG tablet, Take by mouth. (Patient not taking: Reported on 09/28/2023), Disp: , Rfl:    clindamycin (CLEOCIN T) 1 % SWAB, Apply topically., Disp: , Rfl:    clobetasol ointment (TEMOVATE) 0.05 %, Apply topically 2 (two) times daily as needed. (Patient not taking: Reported on 10/12/2022), Disp: , Rfl:    DESCOVY  200-25 MG tablet, Take 1 tablet by mouth daily. (Patient not taking: Reported on 09/28/2023), Disp: , Rfl:    diclofenac  Sodium (VOLTAREN ) 1 % GEL, Apply 4 g topically 4 (four) times daily. (Patient not taking: Reported on 04/25/2022), Disp: 100 g, Rfl: 1   doxycycline  (VIBRAMYCIN ) 100 MG capsule, Take 100 mg by mouth 2 (two) times daily., Disp: , Rfl:    methocarbamol  (ROBAXIN ) 500 MG tablet, TAKE 1 TABLET (500 MG TOTAL) BY MOUTH 2 (TWO) TIMES DAILY AS NEEDED. FOR PAIN (Patient not taking: Reported on 09/28/2023), Disp: 60 tablet, Rfl: 0   midodrine  (PROAMATINE) 5 MG tablet, Take 5 mg by mouth., Disp: , Rfl:    minoxidil (LONITEN) 2.5 MG tablet, Take 2.5 mg by mouth daily. (Patient not taking: Reported on 04/25/2022), Disp: , Rfl:    Omeprazole 20 MG TBEC, Take 20 mg by mouth., Disp: , Rfl:    ondansetron  (ZOFRAN -ODT) 4 MG disintegrating tablet, Take 4 mg by mouth every 8 (eight) hours as needed. (Patient not taking: Reported on 10/12/2022), Disp: , Rfl:    predniSONE  (DELTASONE ) 10 MG tablet, Take 1 tablet (10 mg total) by mouth 2 (two) times daily as needed. Prn gout attack for 2 days, Disp: 10 tablet, Rfl: 0   prochlorperazine  (COMPAZINE ) 5 MG tablet, Take 1 tablet (5 mg total) by mouth every 6 (six) hours as needed for nausea or vomiting (hiccups)., Disp: 15 tablet, Rfl: 0   TIVICAY  50 MG tablet, Take 1 tablet by mouth daily. (Patient not taking: Reported on 04/25/2022), Disp: , Rfl:    tretinoin (RETIN-A) 0.1 % cream, Apply thin layer to shoulders for acne , starting 2 nights per week and advancing to nightly  as tolerated.Okay to mix with moisturizer or skip a few nights as needed for dryness., Disp: , Rfl:    valACYclovir  (VALTREX ) 500 MG tablet, Take 500 mg by mouth daily as needed (for flare ups).  (Patient taking differently: Take 500 mg by mouth every other day.), Disp: , Rfl:    valACYclovir  (VALTREX ) 500 MG tablet, Take by mouth. (Patient  not taking: Reported on 10/12/2022), Disp: , Rfl:   Past Medical History: Past Medical History:  Diagnosis Date   Chronic kidney disease    esrd   Dermatophytosis of foot 06/23/2004   Environmental allergies    ESRD (end stage renal disease) on dialysis (HCC)    Gout    HIV infection (HCC) 12/1995   Hypertension    Seborrhea 06/23/2004    Tobacco Use: Social History   Tobacco Use  Smoking Status Never  Smokeless Tobacco Never    Labs: Review Flowsheet       Latest Ref Rng & Units 06/09/2010 08/09/2011 08/23/2016  Labs for ITP Cardiac and Pulmonary Rehab  Cholestrol 0 - 200 mg/dL 813   820  -  LDL (calc) 0 - 99 mg/dL 882  892  -  HDL-C >60 mg/dL 55  54  -  Trlycerides <150 mg/dL 69  89  -  Hemoglobin J8r 4.8 - 5.6 % - - 5.1      Pulmonary Assessment Scores:  Pulmonary Assessment Scores     Row Name 10/03/23 1024         ADL UCSD   ADL Phase Entry       CAT Score   CAT Score 13       mMRC Score   mMRC Score 0        UCSD: Self-administered rating of dyspnea associated with activities of daily living (ADLs) 6-point scale (0 = not at all to 5 = maximal or unable to do because of breathlessness)  Scoring Scores range from 0 to 120.  Minimally important difference is 5 units  CAT: CAT can identify the health impairment of COPD patients and is better correlated with disease progression.  CAT has a scoring range of zero to 40. The CAT score is classified into four groups of low (less than 10), medium (10 - 20), high (21-30) and very high (31-40) based on the impact level of disease on health status. A CAT score over 10 suggests significant symptoms.  A worsening CAT score could be explained by an exacerbation, poor medication adherence, poor inhaler technique, or progression of COPD or comorbid conditions.  CAT MCID is 2 points  mMRC: mMRC (Modified Medical Research Council) Dyspnea Scale is used to assess the degree of baseline functional disability in patients of respiratory disease due to dyspnea. No minimal important difference is established. A decrease in score of 1 point or greater is considered a positive change.   Pulmonary Function Assessment:   Exercise Target Goals: Exercise Program Goal: Individual exercise prescription set using results from initial 6 min walk test and THRR while considering  patient's activity barriers and safety.   Exercise Prescription Goal: Initial exercise prescription builds to 30-45 minutes a day of aerobic activity, 2-3 days per week.  Home exercise guidelines will be given to patient during program as part of  exercise prescription that the participant will acknowledge.  Education: Aerobic Exercise: - Group verbal and visual presentation on the components of exercise prescription. Introduces F.I.T.T principle from ACSM for exercise prescriptions.  Reviews F.I.T.T. principles of aerobic exercise including progression. Written material provided at class time. Flowsheet Row Pulmonary Rehab from 12/05/2023 in [facility] Cardiac and Pulmonary Rehab  Date 11/28/23  Educator nt  Instruction Review Code 1- TEFL teacher Understanding    Education: Resistance Exercise: - Group verbal and visual presentation on the components of exercise prescription. Introduces F.I.T.T principle from ACSM for exercise prescriptions  Reviews F.I.T.T. principles of resistance  exercise including progression. Written material provided at class time. Flowsheet Row Pulmonary Rehab from 12/05/2023 in [facility] Cardiac and Pulmonary Rehab  Education need identified 11/21/23  Date 11/21/23  Educator nt  Instruction Review Code 1- [employer] Understanding     Education: Exercise & Equipment Safety: - Individual verbal instruction and demonstration of equipment use and safety with use of the equipment. Flowsheet Row Pulmonary Rehab from 12/05/2023 in [facility] Cardiac and Pulmonary Rehab  Date 10/03/23  Educator [facility]  Instruction Review Code 1- Verbalizes Understanding    Education: Exercise Physiology & General Exercise Guidelines: - Group verbal and written instruction with models to review the exercise physiology of the cardiovascular system and associated critical values. Provides general exercise guidelines with specific guidelines to those with heart or lung disease.  Flowsheet Row Pulmonary Rehab from 12/05/2023 in [facility] Cardiac and Pulmonary Rehab  Date 11/14/23  Educator nt  Instruction Review Code 1- TEFL teacher Understanding    Education: Flexibility, Balance, Mind/Body Relaxation: - Group verbal and visual presentation with  interactive activity on the components of exercise prescription. Introduces F.I.T.T principle from ACSM for exercise prescriptions. Reviews F.I.T.T. principles of flexibility and balance exercise training including progression. Also discusses the mind body connection.  Reviews various relaxation techniques to help reduce and manage stress (i.e. Deep breathing, progressive muscle relaxation, and visualization). Balance handout provided to take home. Written material provided at class time. Flowsheet Row Pulmonary Rehab from 12/05/2023 in [facility] Cardiac and Pulmonary Rehab  Date 11/21/23  Educator nt  Instruction Review Code 1- Verbalizes Understanding    Activity Barriers & Risk Stratification:  Activity Barriers & Cardiac Risk Stratification - 10/03/23 1017       Activity Barriers & Cardiac Risk Stratification   Activity Barriers None          6 Minute Walk:  6 Minute Walk     Row Name 10/03/23 1012         6 Minute Walk   Phase Initial     Distance 1340 feet     Walk Time 6 minutes     # of Rest Breaks 0     MPH 2.53     METS 3.46     RPE 7     Perceived Dyspnea  0     VO2 Peak 12.1     Symptoms No     Resting HR 53 bpm     Resting BP 128/70     Resting Oxygen  Saturation  98 %     Exercise Oxygen  Saturation  during 6 min walk 91 %     Max Ex. HR 71 bpm     Max Ex. BP 142/78     2 Minute Post BP 134/74       Interval HR   1 Minute HR 60     2 Minute HR 71     3 Minute HR 69     4 Minute HR 69     Interval Heart Rate? Yes       Interval Oxygen    Interval Oxygen ? Yes     Baseline Oxygen  Saturation % 98 %     1 Minute Oxygen  Saturation % 95 %     1 Minute Liters of Oxygen  0 L     2 Minute Oxygen  Saturation % 94 %     2 Minute Liters of Oxygen  0 L     3 Minute Oxygen  Saturation % 91 %     3 Minute Liters of Oxygen   0 L     4 Minute Oxygen  Saturation % 91 %     4 Minute Liters of Oxygen  0 L     5 Minute Liters of Oxygen  0 L     6 Minute Liters of Oxygen  0 L      2 Minute Post Liters of Oxygen  0 L       Oxygen  Initial Assessment:  Oxygen  Initial Assessment - 10/03/23 1021       Home Oxygen    Home Oxygen  Device None    Sleep Oxygen  Prescription None    Home Exercise Oxygen  Prescription None    Home Resting Oxygen  Prescription None    Compliance with Home Oxygen  Use Yes      Initial 6 min Walk   Oxygen  Used None      Program Oxygen  Prescription   Program Oxygen  Prescription None      Intervention   Short Term Goals To learn and demonstrate proper pursed lip breathing techniques or other breathing techniques. ;To learn and understand importance of monitoring SPO2 with pulse oximeter and demonstrate accurate use of the pulse oximeter.;To learn and understand importance of maintaining oxygen  saturations>88%    Long  Term Goals Maintenance of O2 saturations>88%;Exhibits proper breathing techniques, such as pursed lip breathing or other method taught during program session;Compliance with respiratory medication;Verbalizes importance of monitoring SPO2 with pulse oximeter and return demonstration          Oxygen  Re-Evaluation:  Oxygen  Re-Evaluation     Row Name 10/05/23 9146 10/22/23 0939 11/26/23 0927         Program Oxygen  Prescription   Program Oxygen  Prescription None None None       Home Oxygen    Home Oxygen  Device None None None     Sleep Oxygen  Prescription None None None     Home Exercise Oxygen  Prescription None None None     Home Resting Oxygen  Prescription None None None     Compliance with Home Oxygen  Use Yes -- Yes       Goals/Expected Outcomes   Short Term Goals To learn and demonstrate proper pursed lip breathing techniques or other breathing techniques. ;To learn and understand importance of monitoring SPO2 with pulse oximeter and demonstrate accurate use of the pulse oximeter.;To learn and understand importance of maintaining oxygen  saturations>88% To learn and demonstrate proper pursed lip breathing techniques or other  breathing techniques.  To learn and demonstrate proper pursed lip breathing techniques or other breathing techniques.      Long  Term Goals Maintenance of O2 saturations>88%;Exhibits proper breathing techniques, such as pursed lip breathing or other method taught during program session;Compliance with respiratory medication;Verbalizes importance of monitoring SPO2 with pulse oximeter and return demonstration Exhibits proper breathing techniques, such as pursed lip breathing or other method taught during program session Exhibits proper breathing techniques, such as pursed lip breathing or other method taught during program session     Comments Reviewed PLB technique with pt.  Talked about how it works and it's importance in maintaining their exercise saturations. Informed patient how to perform the Pursed Lipped breathing technique. Told patient to Inhale through the nose and out the mouth with pursed lips to keep their airways open, help oxygenate them better, practice when at rest or doing strenuous activity. Patient Verbalizes understanding of technique and will work on and be reiterated during [facility]. Olanrewaju states that he is doing well with SOB and does not suffer with it on a daily basis. PLB techniques were reviewed  in case SOB occurs.     Goals/Expected Outcomes Short: Become more profiecient at using PLB. Long: Become independent at using PLB. Short: use PLB with exertion. Long: use PLB on exertion proficiently and independently. Short: continue to monitor for SOB and use PLB if needed. Long: maintain independent PLB if SOB occurs.        Oxygen  Discharge (Final Oxygen  Re-Evaluation):  Oxygen  Re-Evaluation - 11/26/23 0927       Program Oxygen  Prescription   Program Oxygen  Prescription None      Home Oxygen    Home Oxygen  Device None    Sleep Oxygen  Prescription None    Home Exercise Oxygen  Prescription None    Home Resting Oxygen  Prescription None    Compliance with Home Oxygen  Use Yes       Goals/Expected Outcomes   Short Term Goals To learn and demonstrate proper pursed lip breathing techniques or other breathing techniques.     Long  Term Goals Exhibits proper breathing techniques, such as pursed lip breathing or other method taught during program session    Comments Ethel states that he is doing well with SOB and does not suffer with it on a daily basis. PLB techniques were reviewed in case SOB occurs.    Goals/Expected Outcomes Short: continue to monitor for SOB and use PLB if needed. Long: maintain independent PLB if SOB occurs.          Initial Exercise Prescription:  Initial Exercise Prescription - 10/03/23 1000       Date of Initial Exercise RX and Referring Provider   Date 10/03/23    Referring Provider Dr. Elna Else      Oxygen    Maintain Oxygen  Saturation 88% or higher      Treadmill   MPH 2.6    Grade 0    Minutes 15    METs 3.46      NuStep   Level 3    SPM 80    Minutes 15    METs 3.46      REL-XR   Level 3    Watts 25    Speed 50    Minutes 15    METs 3.46      Biostep-RELP   Level 3    SPM 80    Minutes 15    METs 3.46      Prescription Details   Duration Progress to 30 minutes of continuous aerobic without signs/symptoms of physical distress      Intensity   THRR 40-80% of Max Heartrate 95-137    Ratings of Perceived Exertion 11-13    Perceived Dyspnea 0-4      Progression   Progression Continue to progress workloads to maintain intensity without signs/symptoms of physical distress.      Resistance Training   Training Prescription Yes    Weight 7lb    Reps 10-15          Perform Capillary Blood Glucose checks as needed.  Exercise Prescription Changes:   Exercise Prescription Changes     Row Name 10/03/23 1000 10/17/23 1500 11/01/23 1000 11/05/23 0900 11/14/23 0700     Response to Exercise   Blood Pressure (Admit) 128/70 128/74 118/70 -- 122/70   Blood Pressure (Exercise) 142/78 148/70 144/76 --  138/76   Blood Pressure (Exit) 134/74 140/86 106/54 -- 134/78   Heart Rate (Admit) 53 bpm 65 bpm 90 bpm -- 65 bpm   Heart Rate (Exercise) 71 bpm 117 bpm 107 bpm -- 110 bpm  Heart Rate (Exit) 69 bpm 63 bpm 68 bpm -- 76 bpm   Oxygen  Saturation (Admit) 98 % 94 % 92 % -- 96 %   Oxygen  Saturation (Exercise) 91 % 91 % 93 % -- 91 %   Oxygen  Saturation (Exit) 91 % 96 % 95 % -- 96 %   Rating of Perceived Exertion (Exercise) 7 12 14  -- 14   Perceived Dyspnea (Exercise) 0 0 0 -- 0   Symptoms none none none -- none   Comments results First 2 weeks of exercise sessions -- -- --   Duration -- Progress to 30 minutes of  aerobic without signs/symptoms of physical distress Progress to 30 minutes of  aerobic without signs/symptoms of physical distress Progress to 30 minutes of  aerobic without signs/symptoms of physical distress Progress to 30 minutes of  aerobic without signs/symptoms of physical distress   Intensity -- THRR unchanged THRR unchanged THRR unchanged THRR unchanged     Progression   Progression -- Continue to progress workloads to maintain intensity without signs/symptoms of physical distress. Continue to progress workloads to maintain intensity without signs/symptoms of physical distress. Continue to progress workloads to maintain intensity without signs/symptoms of physical distress. Continue to progress workloads to maintain intensity without signs/symptoms of physical distress.   Average METs -- 2.29 2.74 2.74 3.5     Resistance Training   Training Prescription -- Yes Yes Yes Yes   Weight -- 7lb 7lb 7lb 7lb   Reps -- 10-15 10-15 10-15 10-15     Interval Training   Interval Training -- No No No No     Treadmill   MPH -- 1.9 2.5 2.5 2.5   Grade -- 0 0 0 0   Minutes -- 15 15 15 15    METs -- 2.45 2.91 2.91 2.91     REL-XR   Level -- 3 5 5 6    Minutes -- 15 15 15 15    METs -- 2.9 3 3  4.7     Biostep-RELP   Level -- -- 10 10 4    Minutes -- -- 15 15 15    METs -- -- 3 3 4       Home Exercise Plan   Plans to continue exercise at -- -- -- Home (comment)  walking/leg exercises Home (comment)  walking/leg exercises   Frequency -- -- -- Add 2 additional days to program exercise sessions. Add 2 additional days to program exercise sessions.   Initial Home Exercises Provided -- -- -- 11/05/23 11/05/23     Oxygen    Maintain Oxygen  Saturation -- 88% or higher 88% or higher 88% or higher 88% or higher    Row Name 11/28/23 0900             Response to Exercise   Blood Pressure (Admit) 124/72       Blood Pressure (Exit) 124/80       Heart Rate (Admit) 63 bpm       Heart Rate (Exercise) 88 bpm       Heart Rate (Exit) 70 bpm       Oxygen  Saturation (Admit) 97 %       Oxygen  Saturation (Exercise) 90 %       Oxygen  Saturation (Exit) 99 %       Rating of Perceived Exertion (Exercise) 15       Perceived Dyspnea (Exercise) 0       Symptoms none       Duration Progress to 30 minutes of  aerobic without signs/symptoms of physical distress       Intensity THRR unchanged         Progression   Progression Continue to progress workloads to maintain intensity without signs/symptoms of physical distress.       Average METs 3.54         Resistance Training   Training Prescription Yes       Weight 7lb       Reps 10-15         Interval Training   Interval Training No         Treadmill   MPH 2.5       Grade 0       Minutes 15       METs 2.91         NuStep   Level 4       Minutes 15       METs 3.8         REL-XR   Level 9       Minutes 15       METs 4.9         Biostep-RELP   Level 8       METs 4         Rower   Level 10       Watts 32       Minutes 15       METs 4.15         Home Exercise Plan   Plans to continue exercise at Home (comment)  walking/leg exercises       Frequency Add 2 additional days to program exercise sessions.       Initial Home Exercises Provided 11/05/23         Oxygen    Maintain Oxygen  Saturation 88% or higher           Exercise Comments:   Exercise Comments     Row Name 10/05/23 [phone]           Exercise Comments First full day of exercise!  Patient was oriented to gym and equipment including functions, settings, policies, and procedures.  Patient's individual exercise prescription and treatment plan were reviewed.  All starting workloads were established based on the results of the 6 minute walk test done at initial orientation visit.  The plan for exercise progression was also introduced and progression will be customized based on patient's performance and goals.          Exercise Goals and Review:   Exercise Goals     Row Name 10/03/23 1019             Exercise Goals   Increase Physical Activity Yes       Intervention Provide advice, education, support and counseling about physical activity/exercise needs.;Develop an individualized exercise prescription for aerobic and resistive training based on initial evaluation findings, risk stratification, comorbidities and participant's personal goals.       Expected Outcomes Short Term: Attend rehab on a regular basis to increase amount of physical activity.;Long Term: Add in home exercise to make exercise part of routine and to increase amount of physical activity.;Long Term: Exercising regularly at least 3-5 days a week.       Increase Strength and Stamina Yes       Intervention Provide advice, education, support and counseling about physical activity/exercise needs.;Develop an individualized exercise prescription for aerobic and resistive training based on initial evaluation findings, risk stratification, comorbidities and participant's personal goals.  Expected Outcomes Short Term: Increase workloads from initial exercise prescription for resistance, speed, and METs.;Short Term: Perform resistance training exercises routinely during rehab and add in resistance training at home;Long Term: Improve cardiorespiratory fitness, muscular endurance and  strength as measured by increased METs and functional capacity ( )       Able to understand and use rate of perceived exertion (RPE) scale Yes       Intervention Provide education and explanation on how to use RPE scale       Expected Outcomes Short Term: Able to use RPE daily in rehab to express subjective intensity level;Long Term:  Able to use RPE to guide intensity level when exercising independently       Able to understand and use Dyspnea scale Yes       Intervention Provide education and explanation on how to use Dyspnea scale       Expected Outcomes Short Term: Able to use Dyspnea scale daily in rehab to express subjective sense of shortness of breath during exertion;Long Term: Able to use Dyspnea scale to guide intensity level when exercising independently       Knowledge and understanding of Target Heart Rate Range (THRR) Yes       Intervention Provide education and explanation of THRR including how the numbers were predicted and where they are located for reference       Expected Outcomes Short Term: Able to state/look up THRR;Short Term: Able to use daily as guideline for intensity in rehab;Long Term: Able to use THRR to govern intensity when exercising independently       Able to check pulse independently Yes       Intervention Provide education and demonstration on how to check pulse in carotid and radial arteries.;Review the importance of being able to check your own pulse for safety during independent exercise       Expected Outcomes Short Term: Able to explain why pulse checking is important during independent exercise;Long Term: Able to check pulse independently and accurately       Understanding of Exercise Prescription Yes       Intervention Provide education, explanation, and written materials on patient's individual exercise prescription       Expected Outcomes Short Term: Able to explain program exercise prescription;Long Term: Able to explain home exercise prescription to  exercise independently          Exercise Goals Re-Evaluation :  Exercise Goals Re-Evaluation     Row Name 10/05/23 0853 10/17/23 1600 11/01/23 1035 11/05/23 0929 11/12/23 0951     Exercise Goal Re-Evaluation   Exercise Goals Review Increase Physical Activity;Able to understand and use rate of perceived exertion (RPE) scale;Knowledge and understanding of Target Heart Rate Range (THRR);Understanding of Exercise Prescription;Increase Strength and Stamina;Able to understand and use Dyspnea scale;Able to check pulse independently Increase Physical Activity;Understanding of Exercise Prescription;Increase Strength and Stamina Increase Physical Activity;Understanding of Exercise Prescription;Increase Strength and Stamina Increase Physical Activity;Able to understand and use rate of perceived exertion (RPE) scale;Knowledge and understanding of Target Heart Rate Range (THRR);Understanding of Exercise Prescription;Increase Strength and Stamina;Able to understand and use Dyspnea scale;Able to check pulse independently Increase Physical Activity;Increase Strength and Stamina;Understanding of Exercise Prescription   Comments Reviewed RPE and dyspnea scale, THR and program prescription with pt today.  Pt voiced understanding and was given a copy of goals to take home. Chauncy is off to a good start in the program. He completed his first 2 weeks of exercise sessions in this review. He  tolerated his exercise prescription well. He worked on the treadmill at a speed of 1.9 mph with no incline. He also worked at level 3 on the XR. We will continue to monitor his progress in the program. Marlow is doing well in rehab. He increased to a workload on the treadmill of a speed of 2.5 mph and no incline. He also increased to level 5 on the XR and level 10 on the biostep. We will continue to monitor his progress in the program. Reviewed home exercise with pt today from 9:15am to 9:25am.  Pt plans to walk for exercise.  Reviewed  THR, pulse, RPE, sign and symptoms, pulse oximetery and when to call 911 or MD.  Also discussed weather considerations and indoor options.  Pt voiced understanding. Followed up with [name] about his home exercise. He states he is consistently doing leg extentions and dips as resistive work and is trying to walk some.   Expected Outcomes Short: Use RPE daily to regulate intensity. Long: Follow program prescription in THR. Short: Continue to follow current exercise prescription. Long: Continue exercise to improve strength and stamina. Short: Continue to progressively increase treadmill workload. Long: Continue exercise to improve strength and stamina. Short: add 1-2 days a week of exercise at home on off days of rehab. Long: maintain independent exercise routine upon graduation from pulmonary rehab. Short: continue to progress with home exercise. Long: maintain independent exercise routine upon graduation from pulmonary rehab.    Row Name 11/14/23 0759 11/28/23 0942           Exercise Goal Re-Evaluation   Exercise Goals Review Increase Physical Activity;Increase Strength and Stamina;Understanding of Exercise Prescription Increase Physical Activity;Increase Strength and Stamina;Understanding of Exercise Prescription      Comments Marlo is doing well in rehab. He was recently able to increase from level 5 to 6 on the XR. He was also able to maintain his treadmill workload of 2. and no incline. We will continue to monitor his progress in the program. Mordecai continues to do well in rehab. He was able to increase to level 9 on the XR, level 8 on the biostep, and level 4 on the T4 nustep. He also tried the rower and was able to do level 10 and average 32 watts. We will continue to monitor his progress in the program.      Expected Outcomes Short: Continue to increase XR workload. Long: Continue exercise to improve strength and stamina. Short: Continue to progressively increase XR and T4 nustep workload.  Long: Continue exercise to improve strength and stamina.         Discharge Exercise Prescription (Final Exercise Prescription Changes):  Exercise Prescription Changes - 11/28/23 0900       Response to Exercise   Blood Pressure (Admit) 124/72    Blood Pressure (Exit) 124/80    Heart Rate (Admit) 63 bpm    Heart Rate (Exercise) 88 bpm    Heart Rate (Exit) 70 bpm    Oxygen  Saturation (Admit) 97 %    Oxygen  Saturation (Exercise) 90 %    Oxygen  Saturation (Exit) 99 %    Rating of Perceived Exertion (Exercise) 15    Perceived Dyspnea (Exercise) 0    Symptoms none    Duration Progress to 30 minutes of  aerobic without signs/symptoms of physical distress    Intensity THRR unchanged      Progression   Progression Continue to progress workloads to maintain intensity without signs/symptoms of physical distress.    Average  METs 3.54      Resistance Training   Training Prescription Yes    Weight 7lb    Reps 10-15      Interval Training   Interval Training No      Treadmill   MPH 2.5    Grade 0    Minutes 15    METs 2.91      NuStep   Level 4    Minutes 15    METs 3.8      REL-XR   Level 9    Minutes 15    METs 4.9      Biostep-RELP   Level 8    METs 4      Rower   Level 10    Watts 32    Minutes 15    METs 4.15      Home Exercise Plan   Plans to continue exercise at Home (comment)   walking/leg exercises   Frequency Add 2 additional days to program exercise sessions.    Initial Home Exercises Provided 11/05/23      Oxygen    Maintain Oxygen  Saturation 88% or higher          Nutrition:  Target Goals: Understanding of nutrition guidelines, daily intake of sodium 1500mg , cholesterol 200mg , calories 30% from fat and 7% or less from saturated fats, daily to have 5 or more servings of fruits and vegetables.  Education: Nutrition 1 -Group instruction provided by verbal, written material, interactive activities, discussions, models, and posters to present  general guidelines for heart healthy nutrition including macronutrients, label reading, and promoting whole foods over processed counterparts. Education serves as Pensions consultant of discussion of heart healthy eating for all. Written material provided at class time. Flowsheet Row Pulmonary Rehab from 12/05/2023 in [facility] Cardiac and Pulmonary Rehab  Date 12/05/23  Educator jg  Instruction Review Code 1- Verbalizes Understanding      Education: Nutrition 2 -Group instruction provided by verbal, written material, interactive activities, discussions, models, and posters to present general guidelines for heart healthy nutrition including sodium, cholesterol, and saturated fat. Providing guidance of habit forming to improve blood pressure, cholesterol, and body weight. Written material provided at class time.     Biometrics:  Pre Biometrics - 10/03/23 1020       Pre Biometrics   Height 6' 1.1 (1.857 m)    Weight 194 lb 12.8 oz (88.4 kg)    Waist Circumference 36 inches    Hip Circumference 39 inches    Waist to Hip Ratio 0.92 %    BMI (Calculated) 25.62    Single Leg Stand 17.5 seconds           Nutrition Therapy Plan and Nutrition Goals:   Nutrition Assessments:  MEDIFICTS Score Key: >=70 Need to make dietary changes  40-70 Heart Healthy Diet <= 40 Therapeutic Level Cholesterol Diet  Flowsheet Row Pulmonary Rehab from 10/03/2023 in [facility] Cardiac and Pulmonary Rehab  Picture Your Plate Total Score on Admission 44   Picture Your Plate Scores: <59 Unhealthy dietary pattern with much room for improvement. 41-50 Dietary pattern unlikely to meet recommendations for good health and room for improvement. 51-60 More healthful dietary pattern, with some room for improvement.  >60 Healthy dietary pattern, although there may be some specific behaviors that could be improved.   Nutrition Goals Re-Evaluation:  Nutrition Goals Re-Evaluation     Row Name 10/22/23 [phone] 11/26/23 0933            Goals   Comment  Patient was informed on why it is important to maintain a balanced diet when dealing with Respiratory issues. Explained that it takes a lot of energy to breath and when they are short of breath often they will need to have a good diet to help keep up with the calories they are expending for breathing. Continues to defer RD apt.      Expected Outcome Short: Choose and plan snacks accordingly to patients caloric intake to improve breathing. Long: Maintain a diet independently that meets their caloric intake to aid in daily shortness of breath. --         Nutrition Goals Discharge (Final Nutrition Goals Re-Evaluation):  Nutrition Goals Re-Evaluation - 11/26/23 0933       Goals   Comment Continues to defer RD apt.          Psychosocial: Target Goals: Acknowledge presence or absence of significant depression and/or stress, maximize coping skills, provide positive support system. Participant is able to verbalize types and ability to use techniques and skills needed for reducing stress and depression.   Education: Stress, Anxiety, and Depression - Group verbal and visual presentation to define topics covered.  Reviews how body is impacted by stress, anxiety, and depression.  Also discusses healthy ways to reduce stress and to treat/manage anxiety and depression.  Written material provided at class time. Flowsheet Row Pulmonary Rehab from 12/05/2023 in [facility] Cardiac and Pulmonary Rehab  Date 11/07/23  Educator kb  Instruction Review Code 1- [employer] Understanding    Education: Sleep Hygiene -Provides group verbal and written instruction about how sleep can affect your health.  Define sleep hygiene, discuss sleep cycles and impact of sleep habits. Review good sleep hygiene tips.    Initial Review & Psychosocial Screening:  Initial Psych Review & Screening - 09/28/23 1040       Initial Review   Current issues with Current Stress Concerns    Source of Stress Concerns  Chronic Illness      Family Dynamics   Good Support System? Yes      Barriers   Psychosocial barriers to participate in program There are no identifiable barriers or psychosocial needs.      Screening Interventions   Interventions Encouraged to exercise;To provide support and resources with identified psychosocial needs;Provide feedback about the scores to participant    Expected Outcomes Short Term goal: Utilizing psychosocial counselor, staff and physician to assist with identification of specific Stressors or current issues interfering with healing process. Setting desired goal for each stressor or current issue identified.;Long Term Goal: Stressors or current issues are controlled or eliminated.;Short Term goal: Identification and review with participant of any Quality of Life or Depression concerns found by scoring the questionnaire.;Long Term goal: The participant improves quality of Life and PHQ9 Scores as seen by post scores and/or verbalization of changes          Quality of Life Scores:  Scores of 19 and below usually indicate a poorer quality of life in these areas.  A difference of  2-3 points is a clinically meaningful difference.  A difference of 2-3 points in the total score of the Quality of Life Index has been associated with significant improvement in overall quality of life, self-image, physical symptoms, and general health in studies assessing change in quality of life.  PHQ-9: Review Flowsheet  More data exists      10/22/2023 12/14/2022 10/31/2022 04/25/2022 10/05/2020  Depression screen PHQ 2/9  Decreased Interest 0 0 2 0 0  Down, Depressed, Hopeless 0 0 0 0 0  PHQ - 2 Score 0 0 2 0 0  Altered sleeping 0 0 1 0 -  Tired, decreased energy 0 2 2 1  -  Change in appetite 0 0 2 0 -  Feeling bad or failure about yourself  0 0 0 0 -  Trouble concentrating 0 0 0 0 -  Moving slowly or fidgety/restless 0 0 0 1 -  Suicidal thoughts 0 0 0 0 -  PHQ-9 Score 0 2 7 2  -   Difficult doing work/chores Not difficult at all Not difficult at all - - -   Interpretation of Total Score  Total Score Depression Severity:  1-4 = Minimal depression, 5-9 = Mild depression, 10-14 = Moderate depression, 15-19 = Moderately severe depression, 20-27 = Severe depression   Psychosocial Evaluation and Intervention:  Psychosocial Evaluation - 09/28/23 1056       Psychosocial Evaluation & Interventions   Interventions Encouraged to exercise with the program and follow exercise prescription;Stress management education;Relaxation education    Comments Mr. Walston is coming to pulmonary rehab. He mentions that his transplant team wants him to complete the program in preparation for a kidney transplant work up. 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 He reports his biggest stress concern is how he is going to do exercising given his long covid symptoms versus renal symptoms. He has done the program before, so he knows what to expect. He does state his stress level has been higher thinking about attending the program, but he is wanting to attend to help prepare him for transplant work up.    Expected Outcomes Short: attend pulmonary rehab for education and exercise Long: develop and maintain positive self care habits.    Continue Psychosocial Services  Follow up required by staff          Psychosocial Re-Evaluation:  Psychosocial Re-Evaluation     Row Name 10/22/23 [phone] 11/26/23 0935           Psychosocial Re-Evaluation   Current issues with None Identified None Identified      Comments Reviewed patient health questionnaire (PHQ-9) with patient for follow up. Previously, patients score indicated signs/symptoms of depression.  Reviewed to see if patient is improving symptom wise while in program.  Initial PHQ done since the patient has restarteds Rehab since the last score. Ewen reports that he ahs no concerns with stress, sleep, or mental health.  He in fact reports that he has been sleeping better. He sleeps though the night and feels rested in the morning.      Expected Outcomes Short: Continue to attend [facility] regularly for regular exercise and social engagement. Long: Continue to improve symptoms and manage a positive mental state. Short: continue to attend pulmonary rehab for the mental health benefits of exercise. Long: maintain good mental health habits.      Interventions Encouraged to attend Pulmonary Rehabilitation for the exercise Encouraged to attend Pulmonary Rehabilitation for the exercise      Continue Psychosocial Services  Follow up required by staff Follow up required by staff         Psychosocial Discharge (Final Psychosocial Re-Evaluation):  Psychosocial Re-Evaluation - 11/26/23 0935       Psychosocial Re-Evaluation   Current issues with None Identified    Comments Jayshaun reports that he ahs no concerns with stress, sleep, or mental health. He in fact reports that he has been sleeping better. He sleeps though the  night and feels rested in the morning.    Expected Outcomes Short: continue to attend pulmonary rehab for the mental health benefits of exercise. Long: maintain good mental health habits.    Interventions Encouraged to attend Pulmonary Rehabilitation for the exercise    Continue Psychosocial Services  Follow up required by staff          Education: Education Goals: Education classes will be provided on a weekly basis, covering required topics. Participant will state understanding/return demonstration of topics presented.  Learning Barriers/Preferences:  Learning Barriers/Preferences - 09/28/23 1039       Learning Barriers/Preferences   Learning Barriers Exercise Concerns    Learning Preferences None          General Pulmonary Education Topics:  Infection Prevention: - Provides verbal and written material to individual with discussion of infection control including proper hand washing and  proper equipment cleaning during exercise session. Flowsheet Row Pulmonary Rehab from 12/05/2023 in [facility] Cardiac and Pulmonary Rehab  Date 10/03/23  Educator [facility]  Instruction Review Code 1- Verbalizes Understanding    Falls Prevention: - Provides verbal and written material to individual with discussion of falls prevention and safety. Flowsheet Row Pulmonary Rehab from 12/05/2023 in [facility] Cardiac and Pulmonary Rehab  Date 10/03/23  Educator [facility]  Instruction Review Code 1- Verbalizes Understanding    Chronic Lung Disease Review: - Group verbal instruction with posters, models, PowerPoint presentations and videos,  to review new updates, new respiratory medications, new advancements in procedures and treatments. Providing information on websites and 800 numbers for continued self-education. Includes information about supplement oxygen , available portable oxygen  systems, continuous and intermittent flow rates, oxygen  safety, concentrators, and Medicare reimbursement for oxygen . Explanation of Pulmonary Drugs, including class, frequency, complications, importance of spacers, rinsing mouth after steroid MDI's, and proper cleaning methods for nebulizers. Review of basic lung anatomy and physiology related to function, structure, and complications of lung disease. Review of risk factors. Discussion about methods for diagnosing sleep apnea and types of masks and machines for OSA. Includes a review of the use of types of environmental controls: home humidity, furnaces, filters, dust mite/pet prevention, HEPA vacuums. Discussion about weather changes, air quality and the benefits of nasal washing. Instruction on Warning signs, infection symptoms, calling MD promptly, preventive modes, and value of vaccinations. Review of effective airway clearance, coughing and/or vibration techniques. Emphasizing that all should Create an Action Plan. Written material provided at class time. Flowsheet Row Pulmonary Rehab from  12/05/2023 in [facility] Cardiac and Pulmonary Rehab  Education need identified 10/03/23  Date 10/31/23  Educator jh  Instruction Review Code 1- Verbalizes Understanding    AED/CPR: - Group verbal and written instruction with the use of models to demonstrate the basic use of the AED with the basic ABC's of resuscitation.    Tests and Procedures:  - Group verbal and visual presentation and models provide information about basic cardiac anatomy and function. Reviews the testing methods done to diagnose heart disease and the outcomes of the test results. Describes the treatment choices: Medical Management, Angioplasty, or Coronary Bypass Surgery for treating various heart conditions including Myocardial Infarction, Angina, Valve Disease, and Cardiac Arrhythmias.  Written material provided at class time. Flowsheet Row Pulmonary Rehab from 12/05/2023 in [facility] Cardiac and Pulmonary Rehab  Date 10/10/23  Educator KB  Instruction Review Code 1- Verbalizes Understanding    Medication Safety: - Group verbal and visual instruction to review commonly prescribed medications for heart and lung disease. Reviews the medication, class of  the drug, and side effects. Includes the steps to properly store meds and maintain the prescription regimen.  Written material given at graduation.   Other: -Provides group and verbal instruction on various topics (see comments)   Knowledge Questionnaire Score:  Knowledge Questionnaire Score - 10/05/23 1026       Knowledge Questionnaire Score   Pre Score 12/18           Core Components/Risk Factors/Patient Goals at Admission:  Personal Goals and Risk Factors at Admission - 09/28/23 1039       Core Components/Risk Factors/Patient Goals on Admission   Improve shortness of breath with ADL's Yes    Intervention Provide education, individualized exercise plan and daily activity instruction to help decrease symptoms of SOB with activities of daily living.    Expected  Outcomes Short Term: Improve cardiorespiratory fitness to achieve a reduction of symptoms when performing ADLs;Long Term: Be able to perform more ADLs without symptoms or delay the onset of symptoms    Heart Failure Yes    Intervention Provide a combined exercise and nutrition program that is supplemented with education, support and counseling about heart failure. Directed toward relieving symptoms such as shortness of breath, decreased exercise tolerance, and extremity edema.    Expected Outcomes Improve functional capacity of life;Short term: Attendance in program 2-3 days a week with increased exercise capacity. Reported lower sodium intake. Reported increased fruit and vegetable intake. Reports medication compliance.;Short term: Daily weights obtained and reported for increase. Utilizing diuretic protocols set by physician.;Long term: Adoption of self-care skills and reduction of barriers for early signs and symptoms recognition and intervention leading to self-care maintenance.    Hypertension Yes    Intervention Provide education on lifestyle modifcations including regular physical activity/exercise, weight management, moderate sodium restriction and increased consumption of fresh fruit, vegetables, and low fat dairy, alcohol moderation, and smoking cessation.;Monitor prescription use compliance.    Expected Outcomes Short Term: Continued assessment and intervention until BP is < 140/54mm HG in hypertensive participants. < 130/22mm HG in hypertensive participants with diabetes, heart failure or chronic kidney disease.;Long Term: Maintenance of blood pressure at goal levels.          Education:Diabetes - Individual verbal and written instruction to review signs/symptoms of diabetes, desired ranges of glucose level fasting, after meals and with exercise. Acknowledge that pre and post exercise glucose checks will be done for 3 sessions at entry of program.   Know Your Numbers and Heart Failure: -  Group verbal and visual instruction to discuss disease risk factors for cardiac and pulmonary disease and treatment options.  Reviews associated critical values for Overweight/Obesity, Hypertension, Cholesterol, and Diabetes.  Discusses basics of heart failure: signs/symptoms and treatments.  Introduces Heart Failure Zone chart for action plan for heart failure. Written material provided at class time. Flowsheet Row Pulmonary Rehab from 12/05/2023 in [facility] Cardiac and Pulmonary Rehab  Date 10/24/23  Educator KB  Instruction Review Code 1- Verbalizes Understanding    Core Components/Risk Factors/Patient Goals Review:   Goals and Risk Factor Review     Row Name 10/22/23 [phone] 11/26/23 0934           Core Components/Risk Factors/Patient Goals Review   Personal Goals Review Improve shortness of breath with ADL's Weight Management/Obesity      Review Spoke to patient about their shortness of breath and what they can do to improve. Patient has been informed of breathing techniques when starting the program. Patient is informed to tell staff if they  have had any med changes and that certain meds they are taking or not taking can be causing shortness of breath. Anothony consistently and independently checks weight at home to monitor fluid levels and medication.      Expected Outcomes Short: Attend [facility] regularly to improve shortness of breath with ADL's. Long: maintain independence with ADL's Short: continue to weight at home and monitor for signs of HF and fluid retention. Long: control risk factors and maintain steady weight.         Core Components/Risk Factors/Patient Goals at Discharge (Final Review):   Goals and Risk Factor Review - 11/26/23 0934       Core Components/Risk Factors/Patient Goals Review   Personal Goals Review Weight Management/Obesity    Review Anothony consistently and independently checks weight at home to monitor fluid levels and medication.    Expected Outcomes  Short: continue to weight at home and monitor for signs of HF and fluid retention. Long: control risk factors and maintain steady weight.          ITP Comments:  ITP Comments     Row Name 09/28/23 1038 10/03/23 1009 10/05/23 0853 10/10/23 1003 11/07/23 1449   ITP Comments Initial phone call completed. Diagnosis can be found in [facility] 7/25. EP Orientation scheduled for Wednesday 8/20 at 9am. Completed and gym orientation for pulmonary rehab. Initial ITP created and sent for review to Dr. Faud Aleskerov, Medical Director. First full day of exercise!  Patient was oriented to gym and equipment including functions, settings, policies, and procedures.  Patient's individual exercise prescription and treatment plan were reviewed.  All starting workloads were established based on the results of the 6 minute walk test done at initial orientation visit.  The plan for exercise progression was also introduced and progression will be customized based on patient's performance and goals. 30 Day review completed. Medical Director ITP review done; changes made as directed and signed approval by Medical Director. New to program. 30 Day review completed. Medical Director ITP review done; changes made as directed and signed approval by Medical Director.    Row Name 12/05/23 1005           ITP Comments 30 Day review completed. Medical Director ITP review done; changes made as directed and signed approval by Medical Director.          Comments: 30 day review

## 2023-12-05 NOTE — Progress Notes (Signed)
 Daily Session Note  Patient Details  Name: Louis [name] MRN: [id number] Date of Birth: [birth year] Referring Provider:   Conrad Ports Pulmonary Rehab from 10/03/2023 in [facility] Cardiac and Pulmonary Rehab  Referring Provider Dr. Elna Else    Encounter Date: 12/05/2023  Check In:  Session Check In - 12/05/23 0939       Check-In   Supervising physician immediately available to respond to emergencies See telemetry face sheet for immediately available ER MD    Location [facility]-Cardiac & Pulmonary Rehab    Staff Present Burnard Hint BS, ACSM CEP, Exercise Physiologist;Noah Tickle, BS, Exercise Physiologist;Joseph Rolinda RCP,RRT,BSRT;Quentavious Rittenhouse RN,BSN    Virtual Visit No    Medication changes reported     No    Fall or balance concerns reported    No    Tobacco Cessation No Change    Warm-up and Cool-down Performed on first and last piece of equipment    Resistance Training Performed Yes    VAD Patient? No    PAD/SET Patient? No      Pain Assessment   Currently in Pain? No/denies             Social History   Tobacco Use  Smoking Status Never  Smokeless Tobacco Never    Goals Met:  Proper associated with RPD/PD & O2 Sat Independence with exercise equipment Using PLB without cueing & demonstrates good technique Exercise tolerated well No report of concerns or symptoms today Strength training completed today  Goals Unmet:  Not Applicable  Comments: Pt able to follow exercise prescription today without complaint.  Will continue to monitor for progression.    Dr. Oneil Pinal is Medical Director for [facility] Cardiac Rehabilitation.  Dr. Fuad Aleskerov is Medical Director for [facility] Pulmonary Rehabilitation.

## 2023-12-07 ENCOUNTER — Encounter: Admitting: Emergency Medicine

## 2023-12-07 DIAGNOSIS — J439 Emphysema, unspecified: Secondary | ICD-10-CM

## 2023-12-07 NOTE — Progress Notes (Signed)
 Daily Session Note  Patient Details  Name: Louis [name] MRN: [id number] Date of Birth: Jun 11, 1960 Referring Provider:   Conrad Ports Pulmonary Rehab from 10/03/2023 in [facility] Cardiac and Pulmonary Rehab  Referring Provider Dr. Elna Else    Encounter Date: 12/07/2023  Check In:  Session Check In - 12/07/23 0910       Check-In   Supervising physician immediately available to respond to emergencies See telemetry face sheet for immediately available ER MD    Location [facility]-Cardiac & Pulmonary Rehab    Staff Present Leita Franks RN,BSN;Joseph [facility] BS, Exercise Physiologist;Noah Tickle, BS, Exercise Physiologist    Virtual Visit No    Medication changes reported     No    Fall or balance concerns reported    No    Tobacco Cessation No Change    Warm-up and Cool-down Performed on first and last piece of equipment    Resistance Training Performed Yes    VAD Patient? No    PAD/SET Patient? No      Pain Assessment   Currently in Pain? No/denies             Social History   Tobacco Use  Smoking Status Never  Smokeless Tobacco Never    Goals Met:  Proper associated with RPD/PD & O2 Sat Independence with exercise equipment Using PLB without cueing & demonstrates good technique Exercise tolerated well No report of concerns or symptoms today Strength training completed today  Goals Unmet:  Not Applicable  Comments: Pt able to follow exercise prescription today without complaint.  Will continue to monitor for progression.    Dr. Oneil Pinal is Medical Director for [facility] Cardiac Rehabilitation.  Dr. Fuad Aleskerov is Medical Director for [facility] Pulmonary Rehabilitation.

## 2023-12-10 ENCOUNTER — Encounter

## 2023-12-10 DIAGNOSIS — J439 Emphysema, unspecified: Secondary | ICD-10-CM

## 2023-12-10 NOTE — Progress Notes (Signed)
 Daily Session Note  Patient Details  Name: Louis [name] MRN: [id number] Date of Birth: [birth year] Referring Provider:   Conrad Ports Pulmonary Rehab from 10/03/2023 in [facility] Cardiac and Pulmonary Rehab  Referring Provider Dr. Elna Else    Encounter Date: 12/10/2023  Check In:  Session Check In - 12/10/23 0932       Check-In   Supervising physician immediately available to respond to emergencies See telemetry face sheet for immediately available ER MD    Location [facility]-Cardiac & Pulmonary Rehab    Staff Present Burnard Davenport RN,BSN,MPA;Maxon Burnell BS, Exercise Physiologist;Joseph Rolinda RCP,RRT,BSRT;Laura Cates RN,BSN;Franco Duley Stuart BS, ACSM CEP, Exercise Physiologist    Virtual Visit No    Medication changes reported     No    Fall or balance concerns reported    No    Tobacco Cessation No Change    Warm-up and Cool-down Performed on first and last piece of equipment    Resistance Training Performed Yes    VAD Patient? No    PAD/SET Patient? No      Pain Assessment   Currently in Pain? No/denies             Social History   Tobacco Use  Smoking Status Never  Smokeless Tobacco Never    Goals Met:  Proper associated with RPD/PD & O2 Sat Independence with exercise equipment Using PLB without cueing & demonstrates good technique Exercise tolerated well No report of concerns or symptoms today Strength training completed today  Goals Unmet:  Not Applicable  Comments: Pt able to follow exercise prescription today without complaint.  Will continue to monitor for progression.    Dr. Oneil Pinal is Medical Director for [facility] Cardiac Rehabilitation.  Dr. Fuad Aleskerov is Medical Director for [facility] Pulmonary Rehabilitation.

## 2023-12-11 ENCOUNTER — Ambulatory Visit: Admit: 2023-12-11 | Discharge: 2023-12-12 | Payer: Medicaid (Managed Care)

## 2023-12-12 ENCOUNTER — Encounter: Admitting: Emergency Medicine

## 2023-12-12 VITALS — Ht 73.1 in | Wt 195.2 lb

## 2023-12-12 DIAGNOSIS — J439 Emphysema, unspecified: Secondary | ICD-10-CM

## 2023-12-12 NOTE — Patient Instructions (Signed)
 Discharge Patient Instructions  Patient Details  Name: Louis [name] MRN: [id number] Date of Birth: [birth year] Referring Provider:  Osa Geralds, NP   Number of Visits: 36  Reason for Discharge:  Patient reached a stable level of exercise. Patient independent in their exercise. Patient has met program and personal goals.  Diagnosis:  Emphysema, unspecified (HCC)  Initial Exercise Prescription:  Initial Exercise Prescription - 10/03/23 1000       Date of Initial Exercise RX and Referring Provider   Date 10/03/23    Referring Provider Dr. Elna Else      Oxygen    Maintain Oxygen  Saturation 88% or higher      Treadmill   MPH 2.6    Grade 0    Minutes 15    METs 3.46      NuStep   Level 3    SPM 80    Minutes 15    METs 3.46      REL-XR   Level 3    Watts 25    Speed 50    Minutes 15    METs 3.46      Biostep-RELP   Level 3    SPM 80    Minutes 15    METs 3.46      Prescription Details   Duration Progress to 30 minutes of continuous aerobic without signs/symptoms of physical distress      Intensity   THRR 40-80% of Max Heartrate 95-137    Ratings of Perceived Exertion 11-13    Perceived Dyspnea 0-4      Progression   Progression Continue to progress workloads to maintain intensity without signs/symptoms of physical distress.      Resistance Training   Training Prescription Yes    Weight 7lb    Reps 10-15          Discharge Exercise Prescription (Final Exercise Prescription Changes):  Exercise Prescription Changes - 11/28/23 0900       Response to Exercise   Blood Pressure (Admit) 124/72    Blood Pressure (Exit) 124/80    Heart Rate (Admit) 63 bpm    Heart Rate (Exercise) 88 bpm    Heart Rate (Exit) 70 bpm    Oxygen  Saturation (Admit) 97 %    Oxygen  Saturation (Exercise) 90 %    Oxygen  Saturation (Exit) 99 %    Rating of Perceived Exertion (Exercise) 15    Perceived Dyspnea (Exercise) 0    Symptoms none    Duration  Progress to 30 minutes of  aerobic without signs/symptoms of physical distress    Intensity THRR unchanged      Progression   Progression Continue to progress workloads to maintain intensity without signs/symptoms of physical distress.    Average METs 3.54      Resistance Training   Training Prescription Yes    Weight 7lb    Reps 10-15      Interval Training   Interval Training No      Treadmill   MPH 2.5    Grade 0    Minutes 15    METs 2.91      NuStep   Level 4    Minutes 15    METs 3.8      REL-XR   Level 9    Minutes 15    METs 4.9      Biostep-RELP   Level 8    METs 4      Rower   Level 10    Watts  32    Minutes 15    METs 4.15      Home Exercise Plan   Plans to continue exercise at Home (comment)   walking/leg exercises   Frequency Add 2 additional days to program exercise sessions.    Initial Home Exercises Provided 11/05/23      Oxygen    Maintain Oxygen  Saturation 88% or higher          Functional Capacity:  6 Minute Walk     Row Name 10/03/23 1012 12/12/23 0938       6 Minute Walk   Phase Initial Discharge    Distance 1340 feet 1730 feet    Distance % Change -- 29.1 %    Distance Feet Change -- 390 ft    Walk Time 6 minutes 6 minutes    # of Rest Breaks 0 0    MPH 2.53 3.28    METS 3.46 4.06    RPE 7 11    Perceived Dyspnea  0 0    VO2 Peak 12.1 14.21    Symptoms No No    Resting HR 53 bpm 59 bpm    Resting BP 128/70 116/80    Resting Oxygen  Saturation  98 % 95 %    Exercise Oxygen  Saturation  during 6 min walk 91 % 99 %    Max Ex. HR 71 bpm 70 bpm    Max Ex. BP 142/78 130/60    2 Minute Post BP 134/74 120/80      Interval HR   1 Minute HR 60 46  Pulse Ox had issues reading during P6MWT    2 Minute HR 71 46  Pulse Ox had issues reading during P6MWT    3 Minute HR 69 --  Pulse Ox had issues reading during P6MWT    4 Minute HR 69 --  Pulse Ox had issues reading during P6MWT    5 Minute HR -- --  Pulse Ox had issues reading  during P6MWT    6 Minute HR -- 70    2 Minute Post HR -- 60    Interval Heart Rate? Yes Yes      Interval Oxygen    Interval Oxygen ? Yes Yes    Baseline Oxygen  Saturation % 98 % 95 %    1 Minute Oxygen  Saturation % 95 % 99 %  Pulse Ox had issues reading during P6MWT    1 Minute Liters of Oxygen  0 L 0 L    2 Minute Oxygen  Saturation % 94 % 99 %  Pulse Ox had issues reading during P6MWT    2 Minute Liters of Oxygen  0 L 0 L    3 Minute Oxygen  Saturation % 91 % 99 %  Pulse Ox had issues reading during P6MWT    3 Minute Liters of Oxygen  0 L 0 L    4 Minute Oxygen  Saturation % 91 % --  Pulse Ox had issues reading during P6MWT    4 Minute Liters of Oxygen  0 L 0 L    5 Minute Oxygen  Saturation % -- --  Pulse Ox had issues reading during P6MWT    5 Minute Liters of Oxygen  0 L 0 L    6 Minute Oxygen  Saturation % -- --  Pulse Ox had issues reading during P6MWT    6 Minute Liters of Oxygen  0 L 0 L    2 Minute Post Oxygen  Saturation % -- 98 %    2 Minute Post Liters of Oxygen   0 L 0 L      Nutrition & Weight - Outcomes:  Pre Biometrics - 10/03/23 1020       Pre Biometrics   Height 6' 1.1 (1.857 m)    Weight 194 lb 12.8 oz (88.4 kg)    Waist Circumference 36 inches    Hip Circumference 39 inches    Waist to Hip Ratio 0.92 %    BMI (Calculated) 25.62    Single Leg Stand 17.5 seconds          Post Biometrics - 12/12/23 0945        Post  Biometrics   Height 6' 1.1 (1.857 m)    Weight 195 lb 3.2 oz (88.5 kg)    Waist Circumference 39.2 inches    Hip Circumference 42.8 inches    Waist to Hip Ratio 0.92 %    BMI (Calculated) 25.68    Single Leg Stand 30 seconds

## 2023-12-12 NOTE — Progress Notes (Signed)
 Daily Session Note  Patient Details  Name: Louis [name] MRN: [id number] Date of Birth: March 18, 1960 Referring Provider:   Conrad Ports Pulmonary Rehab from 10/03/2023 in [facility] Cardiac and Pulmonary Rehab  Referring Provider Dr. Elna Else    Encounter Date: 12/12/2023  Check In:  Session Check In - 12/12/23 0934       Check-In   Supervising physician immediately available to respond to emergencies See telemetry face sheet for immediately available ER MD    Location [facility]-Cardiac & Pulmonary Rehab    Staff Present Devaughn Jaeger, BS, Exercise Physiologist;Makynlie Rossini RN,BSN;Joseph [employer], MS, Exercise Physiologist    Virtual Visit No    Medication changes reported     No    Fall or balance concerns reported    No    Tobacco Cessation No Change    Warm-up and Cool-down Performed on first and last piece of equipment    Resistance Training Performed Yes    VAD Patient? No    PAD/SET Patient? No      Pain Assessment   Currently in Pain? No/denies             Social History   Tobacco Use  Smoking Status Never  Smokeless Tobacco Never    Goals Met:  Proper associated with RPD/PD & O2 Sat Independence with exercise equipment Using PLB without cueing & demonstrates good technique Exercise tolerated well Personal goals reviewed No report of concerns or symptoms today Strength training completed today  Goals Unmet:  Not Applicable  Comments: Pt able to follow exercise prescription today without complaint.  Will continue to monitor for progression.    Dr. Oneil Pinal is Medical Director for [facility] Cardiac Rehabilitation.  Dr. Fuad Aleskerov is Medical Director for [facility] Pulmonary Rehabilitation.

## 2023-12-14 ENCOUNTER — Encounter: Admitting: Emergency Medicine

## 2023-12-14 DIAGNOSIS — J439 Emphysema, unspecified: Secondary | ICD-10-CM

## 2023-12-14 NOTE — Progress Notes (Signed)
 Daily Session Note  Patient Details  Name: HYDER DEMAN MRN: [id number] Date of Birth: 11-03-1960 Referring Provider:   Conrad Ports Pulmonary Rehab from 10/03/2023 in [facility] Cardiac and Pulmonary Rehab  Referring Provider Dr. Elna Else    Encounter Date: 12/14/2023  Check In:  Session Check In - 12/14/23 9077       Check-In   Supervising physician immediately available to respond to emergencies See telemetry face sheet for immediately available ER MD    Location [facility]-Cardiac & Pulmonary Rehab    Staff Present Devaughn Jaeger, BS, Exercise Physiologist;Georgia Baria RN,BSN;Joseph Hood RCP,RRT,BSRT;Maxon [place] BS, Exercise Physiologist    Virtual Visit No    Medication changes reported     No    Fall or balance concerns reported    No    Tobacco Cessation No Change    Warm-up and Cool-down Performed on first and last piece of equipment    Resistance Training Performed Yes    VAD Patient? No    PAD/SET Patient? No      Pain Assessment   Currently in Pain? No/denies             Social History   Tobacco Use  Smoking Status Never  Smokeless Tobacco Never    Goals Met:  Proper associated with RPD/PD & O2 Sat Independence with exercise equipment Using PLB without cueing & demonstrates good technique Exercise tolerated well No report of concerns or symptoms today Strength training completed today  Goals Unmet:  Not Applicable  Comments: Pt able to follow exercise prescription today without complaint.  Will continue to monitor for progression.    Dr. Oneil Pinal is Medical Director for [facility] Cardiac Rehabilitation.  Dr. Fuad Aleskerov is Medical Director for [facility] Pulmonary Rehabilitation.

## 2023-12-17 ENCOUNTER — Encounter: Attending: Internal Medicine | Admitting: *Deleted

## 2023-12-17 DIAGNOSIS — J439 Emphysema, unspecified: Secondary | ICD-10-CM | POA: Insufficient documentation

## 2023-12-17 NOTE — Progress Notes (Signed)
 Daily Session Note  Patient Details  Name: Louis [name] MRN: [id number] Date of Birth: 04-Jul-1960 Referring Provider:   Conrad Ports Pulmonary Rehab from 10/03/2023 in [facility] Cardiac and Pulmonary Rehab  Referring Provider Dr. Elna Else    Encounter Date: 12/17/2023  Check In:  Session Check In - 12/17/23 0929       Check-In   Supervising physician immediately available to respond to emergencies See telemetry face sheet for immediately available ER MD    Location [facility]-Cardiac & Pulmonary Rehab    Staff Present Othel Durand, RN, BSN, CCRP;Laura Cates RN,BSN;Joseph Hood RCP,RRT,BSRT;Kelly [place]am[place] BS, ACSM CEP, Exercise Physiologist    Virtual Visit No    Medication changes reported     No    Fall or balance concerns reported    No    Warm-up and Cool-down Performed on first and last piece of equipment    Resistance Training Performed Yes    VAD Patient? No    PAD/SET Patient? No      Pain Assessment   Currently in Pain? No/denies             Social History   Tobacco Use  Smoking Status Never  Smokeless Tobacco Never    Goals Met:  Proper associated with RPD/PD & O2 Sat Independence with exercise equipment Exercise tolerated well No report of concerns or symptoms today  Goals Unmet:  Not Applicable  Comments: Pt able to follow exercise prescription today without complaint.  Will continue to monitor for progression.    Dr. Oneil Pinal is Medical Director for [facility] Cardiac Rehabilitation.  Dr. Fuad Aleskerov is Medical Director for [facility] Pulmonary Rehabilitation.

## 2023-12-19 ENCOUNTER — Encounter: Admitting: Emergency Medicine

## 2023-12-19 DIAGNOSIS — J439 Emphysema, unspecified: Secondary | ICD-10-CM

## 2023-12-19 NOTE — Progress Notes (Signed)
 Daily Session Note  Patient Details  Name: Louis [name] MRN: [id number] Date of Birth: [birth year] Referring Provider:   Conrad Ports Pulmonary Rehab from 10/03/2023 in [facility] Cardiac and Pulmonary Rehab  Referring Provider Dr. Elna Else    Encounter Date: 12/19/2023  Check In:  Session Check In - 12/19/23 0924       Check-In   Supervising physician immediately available to respond to emergencies See telemetry face sheet for immediately available ER MD    Location [facility]-Cardiac & Pulmonary Rehab    Staff Present Leita Franks RN,BSN;Joseph [facility] BS, Exercise Physiologist;Margaret Best, MS, Exercise Physiologist    Virtual Visit No    Medication changes reported     No    Fall or balance concerns reported    No    Warm-up and Cool-down Performed on first and last piece of equipment    Resistance Training Performed Yes    VAD Patient? No    PAD/SET Patient? No      Pain Assessment   Currently in Pain? No/denies             Social History   Tobacco Use  Smoking Status Never  Smokeless Tobacco Never    Goals Met:  Proper associated with RPD/PD & O2 Sat Independence with exercise equipment Using PLB without cueing & demonstrates good technique Exercise tolerated well No report of concerns or symptoms today Strength training completed today  Goals Unmet:  Not Applicable  Comments: Pt able to follow exercise prescription today without complaint.  Will continue to monitor for progression.    Dr. Oneil Pinal is Medical Director for [facility] Cardiac Rehabilitation.  Dr. Fuad Aleskerov is Medical Director for [facility] Pulmonary Rehabilitation.

## 2023-12-21 ENCOUNTER — Encounter: Admitting: Emergency Medicine

## 2023-12-21 DIAGNOSIS — J439 Emphysema, unspecified: Secondary | ICD-10-CM

## 2023-12-21 NOTE — Progress Notes (Signed)
 Daily Session Note  Patient Details  Name: Louis [name] MRN: [id number] Date of Birth: Aug 19, 1960 Referring Provider:   Conrad Ports Pulmonary Rehab from 10/03/2023 in [facility] Cardiac and Pulmonary Rehab  Referring Provider Dr. Elna Else    Encounter Date: 12/21/2023  Check In:  Session Check In - 12/21/23 0917       Check-In   Supervising physician immediately available to respond to emergencies See telemetry face sheet for immediately available ER MD    Location [facility]-Cardiac & Pulmonary Rehab    Staff Present Fairy Plater RCP,RRT,BSRT;Yaa Donnellan RN,BSN;Maxon Burnell BS, Exercise Physiologist;Noah Tickle, BS, Exercise Physiologist    Virtual Visit No    Medication changes reported     No    Fall or balance concerns reported    No    Tobacco Cessation No Change    Warm-up and Cool-down Performed on first and last piece of equipment    Resistance Training Performed Yes    VAD Patient? No    PAD/SET Patient? No      Pain Assessment   Currently in Pain? No/denies             Social History   Tobacco Use  Smoking Status Never  Smokeless Tobacco Never    Goals Met:  Proper associated with RPD/PD & O2 Sat Independence with exercise equipment Using PLB without cueing & demonstrates good technique Exercise tolerated well No report of concerns or symptoms today Strength training completed today  Goals Unmet:  Not Applicable  Comments: Pt able to follow exercise prescription today without complaint.  Will continue to monitor for progression.    Dr. Oneil Pinal is Medical Director for [facility] Cardiac Rehabilitation.  Dr. Fuad Aleskerov is Medical Director for [facility] Pulmonary Rehabilitation.

## 2023-12-24 ENCOUNTER — Encounter

## 2023-12-24 DIAGNOSIS — J439 Emphysema, unspecified: Secondary | ICD-10-CM | POA: Diagnosis not present

## 2023-12-24 NOTE — Progress Notes (Signed)
 Daily Session Note  Patient Details  Name: Louis [name] MRN: [id number] Date of Birth: [birth year] Referring Provider:   Conrad Ports Pulmonary Rehab from 10/03/2023 in [facility] Cardiac and Pulmonary Rehab  Referring Provider Dr. Elna Else    Encounter Date: 12/24/2023  Check In:  Session Check In - 12/24/23 0910       Check-In   Supervising physician immediately available to respond to emergencies See telemetry face sheet for immediately available ER MD    Location [facility]-Cardiac & Pulmonary Rehab    Staff Present Burnard Hint BS, ACSM CEP, Exercise Physiologist;Maxon [employer], Exercise Physiologist;Joseph Rolinda NORWOOD HARMAN Tsosie Peggi, RN, DNP, NE-BC;Braylon Lemmons RN,BSN,MPA    Virtual Visit No    Medication changes reported     No    Fall or balance concerns reported    No    Tobacco Cessation No Change    Warm-up and Cool-down Performed on first and last piece of equipment    Resistance Training Performed Yes    VAD Patient? No    PAD/SET Patient? No      Pain Assessment   Currently in Pain? No/denies             Social History   Tobacco Use  Smoking Status Never  Smokeless Tobacco Never    Goals Met:  Independence with exercise equipment Exercise tolerated well No report of concerns or symptoms today Strength training completed today  Goals Unmet:  Not Applicable  Comments: Pt able to follow exercise prescription today without complaint.  Will continue to monitor for progression.    Dr. Oneil Pinal is Medical Director for [facility] Cardiac Rehabilitation.  Dr. Fuad Aleskerov is Medical Director for [facility] Pulmonary Rehabilitation.

## 2023-12-26 ENCOUNTER — Encounter: Admitting: Emergency Medicine

## 2023-12-26 DIAGNOSIS — J439 Emphysema, unspecified: Secondary | ICD-10-CM

## 2023-12-26 NOTE — Progress Notes (Signed)
 Daily Session Note  Patient Details  Name: Louis [name] MRN: [id number] Date of Birth: 1960-02-25 Referring Provider:   Conrad Ports Pulmonary Rehab from 10/03/2023 in [facility] Cardiac and Pulmonary Rehab  Referring Provider Dr. Elna Else    Encounter Date: 12/26/2023  Check In:  Session Check In - 12/26/23 0951       Check-In   Supervising physician immediately available to respond to emergencies See telemetry face sheet for immediately available ER MD    Location [facility]-Cardiac & Pulmonary Rehab    Staff Present Leita Franks RN,BSN;Maxon Burnell BS, Exercise Physiologist;Margaret Best, MS, Exercise Physiologist;Kelly Dyane BS, ACSM CEP, Exercise Physiologist    Virtual Visit No    Medication changes reported     No    Fall or balance concerns reported    No    Tobacco Cessation No Change    Warm-up and Cool-down Performed on first and last piece of equipment    Resistance Training Performed Yes    VAD Patient? No    PAD/SET Patient? No      Pain Assessment   Currently in Pain? No/denies             Social History   Tobacco Use  Smoking Status Never  Smokeless Tobacco Never    Goals Met:  Proper associated with RPD/PD & O2 Sat Independence with exercise equipment Using PLB without cueing & demonstrates good technique Exercise tolerated well No report of concerns or symptoms today Strength training completed today  Goals Unmet:  Not Applicable  Comments:  Louis [name] graduated today from  rehab with 36 sessions completed.  Details of the patient's exercise prescription and what He needs to do in order to continue the prescription and progress were discussed with patient.  Patient was given a copy of prescription and goals.  Patient verbalized understanding. Louis [name] plans to continue to exercise by walking and doing leg exercises.     Dr. Oneil Pinal is Medical Director for [facility] Cardiac Rehabilitation.  Dr. Fuad Aleskerov is Medical Director for  [facility] Pulmonary Rehabilitation.

## 2023-12-26 NOTE — Progress Notes (Signed)
 Pulmonary Individual Treatment Plan  Patient Details  Name: Louis [name] MRN: [id number] Date of Birth: [birth year] Referring Provider:   Conrad Ports Pulmonary Rehab from 10/03/2023 in [facility] Cardiac and Pulmonary Rehab  Referring Provider Dr. Elna Else    Initial Encounter Date:  Flowsheet Row Pulmonary Rehab from 10/03/2023 in [facility] Cardiac and Pulmonary Rehab  Date 10/03/23    Visit Diagnosis: Emphysema, unspecified (HCC)  Patient's Home Medications on Admission:  Current Outpatient Medications:    AURYXIA  1 GM 210 MG(Fe) tablet, Take 1 tablet by mouth 4 (four) times daily -  before meals and at bedtime. (Patient not taking: Reported on 10/12/2022), Disp: , Rfl:    B Complex-C-Folic Acid  (RENA-VITE RX) 1 MG TABS, Take 1 tablet by mouth daily. (Patient not taking: Reported on 04/25/2022), Disp: , Rfl:    BIKTARVY 50-200-25 MG TABS tablet, Take 1 tablet by mouth daily., Disp: , Rfl:    calcium  carbonate (TUMS) 500 MG chewable tablet, Chew by mouth. (Patient not taking: Reported on 09/28/2023), Disp: , Rfl:    carvedilol  (COREG ) 12.5 MG tablet, Take by mouth. (Patient not taking: Reported on 09/28/2023), Disp: , Rfl:    clindamycin (CLEOCIN T) 1 % SWAB, Apply topically., Disp: , Rfl:    clobetasol ointment (TEMOVATE) 0.05 %, Apply topically 2 (two) times daily as needed. (Patient not taking: Reported on 10/12/2022), Disp: , Rfl:    DESCOVY  200-25 MG tablet, Take 1 tablet by mouth daily. (Patient not taking: Reported on 09/28/2023), Disp: , Rfl:    diclofenac  Sodium (VOLTAREN ) 1 % GEL, Apply 4 g topically 4 (four) times daily. (Patient not taking: Reported on 04/25/2022), Disp: 100 g, Rfl: 1   doxycycline  (VIBRAMYCIN ) 100 MG capsule, Take 100 mg by mouth 2 (two) times daily., Disp: , Rfl:    methocarbamol  (ROBAXIN ) 500 MG tablet, TAKE 1 TABLET (500 MG TOTAL) BY MOUTH 2 (TWO) TIMES DAILY AS NEEDED. FOR PAIN (Patient not taking: Reported on 09/28/2023), Disp: 60 tablet, Rfl: 0   midodrine  (PROAMATINE) 5 MG tablet, Take 5 mg by mouth., Disp: , Rfl:    minoxidil (LONITEN) 2.5 MG tablet, Take 2.5 mg by mouth daily. (Patient not taking: Reported on 04/25/2022), Disp: , Rfl:    Omeprazole 20 MG TBEC, Take 20 mg by mouth., Disp: , Rfl:    ondansetron  (ZOFRAN -ODT) 4 MG disintegrating tablet, Take 4 mg by mouth every 8 (eight) hours as needed. (Patient not taking: Reported on 10/12/2022), Disp: , Rfl:    predniSONE  (DELTASONE ) 10 MG tablet, Take 1 tablet (10 mg total) by mouth 2 (two) times daily as needed. Prn gout attack for 2 days, Disp: 10 tablet, Rfl: 0   prochlorperazine  (COMPAZINE ) 5 MG tablet, Take 1 tablet (5 mg total) by mouth every 6 (six) hours as needed for nausea or vomiting (hiccups)., Disp: 15 tablet, Rfl: 0   TIVICAY  50 MG tablet, Take 1 tablet by mouth daily. (Patient not taking: Reported on 04/25/2022), Disp: , Rfl:    tretinoin (RETIN-A) 0.1 % cream, Apply thin layer to shoulders for acne , starting 2 nights per week and advancing to nightly  as tolerated.Okay to mix with moisturizer or skip a few nights as needed for dryness., Disp: , Rfl:    valACYclovir  (VALTREX ) 500 MG tablet, Take 500 mg by mouth daily as needed (for flare ups).  (Patient taking differently: Take 500 mg by mouth every other day.), Disp: , Rfl:    valACYclovir  (VALTREX ) 500 MG tablet, Take by mouth. (Patient  not taking: Reported on 10/12/2022), Disp: , Rfl:   Past Medical History: Past Medical History:  Diagnosis Date   Chronic kidney disease    esrd   Dermatophytosis of foot 06/23/2004   Environmental allergies    ESRD (end stage renal disease) on dialysis (HCC)    Gout    HIV infection (HCC) 12/1995   Hypertension    Seborrhea 06/23/2004    Tobacco Use: Social History   Tobacco Use  Smoking Status Never  Smokeless Tobacco Never    Labs: Review Flowsheet       Latest Ref Rng & Units 06/09/2010 08/09/2011 08/23/2016  Labs for ITP Cardiac and Pulmonary Rehab  Cholestrol 0 - 200 mg/dL 813   820  -  LDL (calc) 0 - 99 mg/dL 882  892  -  HDL-C >60 mg/dL 55  54  -  Trlycerides <150 mg/dL 69  89  -  Hemoglobin J8r 4.8 - 5.6 % - - 5.1      Pulmonary Assessment Scores:  Pulmonary Assessment Scores     Row Name 10/03/23 1024 12/14/23 0925       ADL UCSD   ADL Phase Entry --    SOB Score total -- 7    Rest -- 0    Walk -- 0    Stairs -- 1    Bath -- 0    Dress -- 0    Shop -- 0      CAT Score   CAT Score 13 15      mMRC Score   mMRC Score 0 --       UCSD: Self-administered rating of dyspnea associated with activities of daily living (ADLs) 6-point scale (0 = not at all to 5 = maximal or unable to do because of breathlessness)  Scoring Scores range from 0 to 120.  Minimally important difference is 5 units  CAT: CAT can identify the health impairment of COPD patients and is better correlated with disease progression.  CAT has a scoring range of zero to 40. The CAT score is classified into four groups of low (less than 10), medium (10 - 20), high (21-30) and very high (31-40) based on the impact level of disease on health status. A CAT score over 10 suggests significant symptoms.  A worsening CAT score could be explained by an exacerbation, poor medication adherence, poor inhaler technique, or progression of COPD or comorbid conditions.  CAT MCID is 2 points  mMRC: mMRC (Modified Medical Research Council) Dyspnea Scale is used to assess the degree of baseline functional disability in patients of respiratory disease due to dyspnea. No minimal important difference is established. A decrease in score of 1 point or greater is considered a positive change.   Pulmonary Function Assessment:   Exercise Target Goals: Exercise Program Goal: Individual exercise prescription set using results from initial 6 min walk test and THRR while considering  patient's activity barriers and safety.   Exercise Prescription Goal: Initial exercise prescription builds to 30-45 minutes  a day of aerobic activity, 2-3 days per week.  Home exercise guidelines will be given to patient during program as part of exercise prescription that the participant will acknowledge.  Education: Aerobic Exercise: - Group verbal and visual presentation on the components of exercise prescription. Introduces F.I.T.T principle from ACSM for exercise prescriptions.  Reviews F.I.T.T. principles of aerobic exercise including progression. Written material provided at class time. Flowsheet Row Pulmonary Rehab from 12/12/2023 in [facility] Cardiac and Pulmonary Rehab  Date 11/28/23  Educator nt  Instruction Review Code 1- [employer] Understanding    Education: Resistance Exercise: - Group verbal and visual presentation on the components of exercise prescription. Introduces F.I.T.T principle from ACSM for exercise prescriptions  Reviews F.I.T.T. principles of resistance exercise including progression. Written material provided at class time. Flowsheet Row Pulmonary Rehab from 12/12/2023 in [facility] Cardiac and Pulmonary Rehab  Education need identified 11/21/23  Date 11/21/23  Educator nt  Instruction Review Code 1- [employer] Understanding     Education: Exercise & Equipment Safety: - Individual verbal instruction and demonstration of equipment use and safety with use of the equipment. Flowsheet Row Pulmonary Rehab from 12/12/2023 in [facility] Cardiac and Pulmonary Rehab  Date 10/03/23  Educator [facility]  Instruction Review Code 1- Verbalizes Understanding    Education: Exercise Physiology & General Exercise Guidelines: - Group verbal and written instruction with models to review the exercise physiology of the cardiovascular system and associated critical values. Provides general exercise guidelines with specific guidelines to those with heart or lung disease.  Flowsheet Row Pulmonary Rehab from 12/12/2023 in [facility] Cardiac and Pulmonary Rehab  Date 11/14/23  Educator nt  Instruction Review Code 1- [employer]  Understanding    Education: Flexibility, Balance, Mind/Body Relaxation: - Group verbal and visual presentation with interactive activity on the components of exercise prescription. Introduces F.I.T.T principle from ACSM for exercise prescriptions. Reviews F.I.T.T. principles of flexibility and balance exercise training including progression. Also discusses the mind body connection.  Reviews various relaxation techniques to help reduce and manage stress (i.e. Deep breathing, progressive muscle relaxation, and visualization). Balance handout provided to take home. Written material provided at class time. Flowsheet Row Pulmonary Rehab from 12/12/2023 in [facility] Cardiac and Pulmonary Rehab  Date 11/21/23  Educator nt  Instruction Review Code 1- Verbalizes Understanding    Activity Barriers & Risk Stratification:  Activity Barriers & Cardiac Risk Stratification - 10/03/23 1017       Activity Barriers & Cardiac Risk Stratification   Activity Barriers None          6 Minute Walk:  6 Minute Walk     Row Name 10/03/23 1012 12/12/23 0938       6 Minute Walk   Phase Initial Discharge    Distance 1340 feet 1730 feet    Distance % Change -- 29.1 %    Distance Feet Change -- 390 ft    Walk Time 6 minutes 6 minutes    # of Rest Breaks 0 0    MPH 2.53 3.28    METS 3.46 4.06    RPE 7 11    Perceived Dyspnea  0 0    VO2 Peak 12.1 14.21    Symptoms No No    Resting HR 53 bpm 59 bpm    Resting BP 128/70 116/80    Resting Oxygen  Saturation  98 % 95 %    Exercise Oxygen  Saturation  during 6 min walk 91 % 99 %    Max Ex. HR 71 bpm 70 bpm    Max Ex. BP 142/78 130/60    2 Minute Post BP 134/74 120/80      Interval HR   1 Minute HR 60 46  Pulse Ox had issues reading during P6MWT    2 Minute HR 71 46  Pulse Ox had issues reading during P6MWT    3 Minute HR 69 --  Pulse Ox had issues reading during P6MWT    4 Minute HR 69 --  Pulse  Ox had issues reading during P6MWT    5 Minute HR -- --  Pulse  Ox had issues reading during P6MWT    6 Minute HR -- 70    2 Minute Post HR -- 60    Interval Heart Rate? Yes Yes      Interval Oxygen    Interval Oxygen ? Yes Yes    Baseline Oxygen  Saturation % 98 % 95 %    1 Minute Oxygen  Saturation % 95 % 99 %  Pulse Ox had issues reading during P6MWT    1 Minute Liters of Oxygen  0 L 0 L    2 Minute Oxygen  Saturation % 94 % 99 %  Pulse Ox had issues reading during P6MWT    2 Minute Liters of Oxygen  0 L 0 L    3 Minute Oxygen  Saturation % 91 % 99 %  Pulse Ox had issues reading during P6MWT    3 Minute Liters of Oxygen  0 L 0 L    4 Minute Oxygen  Saturation % 91 % --  Pulse Ox had issues reading during P6MWT    4 Minute Liters of Oxygen  0 L 0 L    5 Minute Oxygen  Saturation % -- --  Pulse Ox had issues reading during P6MWT    5 Minute Liters of Oxygen  0 L 0 L    6 Minute Oxygen  Saturation % -- --  Pulse Ox had issues reading during P6MWT    6 Minute Liters of Oxygen  0 L 0 L    2 Minute Post Oxygen  Saturation % -- 98 %    2 Minute Post Liters of Oxygen  0 L 0 L      Oxygen  Initial Assessment:  Oxygen  Initial Assessment - 10/03/23 1021       Home Oxygen    Home Oxygen  Device None    Sleep Oxygen  Prescription None    Home Exercise Oxygen  Prescription None    Home Resting Oxygen  Prescription None    Compliance with Home Oxygen  Use Yes      Initial 6 min Walk   Oxygen  Used None      Program Oxygen  Prescription   Program Oxygen  Prescription None      Intervention   Short Term Goals To learn and demonstrate proper pursed lip breathing techniques or other breathing techniques. ;To learn and understand importance of monitoring SPO2 with pulse oximeter and demonstrate accurate use of the pulse oximeter.;To learn and understand importance of maintaining oxygen  saturations>88%    Long  Term Goals Maintenance of O2 saturations>88%;Exhibits proper breathing techniques, such as pursed lip breathing or other method taught during program session;Compliance  with respiratory medication;Verbalizes importance of monitoring SPO2 with pulse oximeter and return demonstration          Oxygen  Re-Evaluation:  Oxygen  Re-Evaluation     Row Name 10/05/23 0853 10/22/23 0939 11/26/23 0927 12/14/23 0935       Program Oxygen  Prescription   Program Oxygen  Prescription None None None None      Home Oxygen    Home Oxygen  Device None None None None    Sleep Oxygen  Prescription None None None None    Home Exercise Oxygen  Prescription None None None None    Home Resting Oxygen  Prescription None None None None    Compliance with Home Oxygen  Use Yes -- Yes --      Goals/Expected Outcomes   Short Term Goals To learn and demonstrate proper pursed lip breathing techniques or other breathing techniques. ;To learn and understand importance  of monitoring SPO2 with pulse oximeter and demonstrate accurate use of the pulse oximeter.;To learn and understand importance of maintaining oxygen  saturations>88% To learn and demonstrate proper pursed lip breathing techniques or other breathing techniques.  To learn and demonstrate proper pursed lip breathing techniques or other breathing techniques.  Other    Long  Term Goals Maintenance of O2 saturations>88%;Exhibits proper breathing techniques, such as pursed lip breathing or other method taught during program session;Compliance with respiratory medication;Verbalizes importance of monitoring SPO2 with pulse oximeter and return demonstration Exhibits proper breathing techniques, such as pursed lip breathing or other method taught during program session Exhibits proper breathing techniques, such as pursed lip breathing or other method taught during program session Other    Comments Reviewed PLB technique with pt.  Talked about how it works and it's importance in maintaining their exercise saturations. Informed patient how to perform the Pursed Lipped breathing technique. Told patient to Inhale through the nose and out the mouth with  pursed lips to keep their airways open, help oxygenate them better, practice when at rest or doing strenuous activity. Patient Verbalizes understanding of technique and will work on and be reiterated during [facility]. Rasheen states that he is doing well with SOB and does not suffer with it on a daily basis. PLB techniques were reviewed in case SOB occurs. Zayveon states he has no questions with his oxygen  or breathing techniques. He is going to be graduating the program soon and gets dialysis every other day. He gets his blood pressure and vitals checked when he goes to his appointments.    Goals/Expected Outcomes Short: Become more profiecient at using PLB. Long: Become independent at using PLB. Short: use PLB with exertion. Long: use PLB on exertion proficiently and independently. Short: continue to monitor for SOB and use PLB if needed. Long: maintain independent PLB if SOB occurs. Short: continue to monitor vital signs. Long: maintain vital signs independently.       Oxygen  Discharge (Final Oxygen  Re-Evaluation):  Oxygen  Re-Evaluation - 12/14/23 0935       Program Oxygen  Prescription   Program Oxygen  Prescription None      Home Oxygen    Home Oxygen  Device None    Sleep Oxygen  Prescription None    Home Exercise Oxygen  Prescription None    Home Resting Oxygen  Prescription None      Goals/Expected Outcomes   Short Term Goals Other    Long  Term Goals Other    Comments Isaid states he has no questions with his oxygen  or breathing techniques. He is going to be graduating the program soon and gets dialysis every other day. He gets his blood pressure and vitals checked when he goes to his appointments.    Goals/Expected Outcomes Short: continue to monitor vital signs. Long: maintain vital signs independently.          Initial Exercise Prescription:  Initial Exercise Prescription - 10/03/23 1000       Date of Initial Exercise RX and Referring Provider   Date 10/03/23    Referring  Provider Dr. Elna Else      Oxygen    Maintain Oxygen  Saturation 88% or higher      Treadmill   MPH 2.6    Grade 0    Minutes 15    METs 3.46      NuStep   Level 3    SPM 80    Minutes 15    METs 3.46      REL-XR   Level 3  Watts 25    Speed 50    Minutes 15    METs 3.46      Biostep-RELP   Level 3    SPM 80    Minutes 15    METs 3.46      Prescription Details   Duration Progress to 30 minutes of continuous aerobic without signs/symptoms of physical distress      Intensity   THRR 40-80% of Max Heartrate 95-137    Ratings of Perceived Exertion 11-13    Perceived Dyspnea 0-4      Progression   Progression Continue to progress workloads to maintain intensity without signs/symptoms of physical distress.      Resistance Training   Training Prescription Yes    Weight 7lb    Reps 10-15          Perform Capillary Blood Glucose checks as needed.  Exercise Prescription Changes:   Exercise Prescription Changes     Row Name 10/03/23 1000 10/17/23 1500 11/01/23 1000 11/05/23 0900 11/14/23 0700     Response to Exercise   Blood Pressure (Admit) 128/70 128/74 118/70 -- 122/70   Blood Pressure (Exercise) 142/78 148/70 144/76 -- 138/76   Blood Pressure (Exit) 134/74 140/86 106/54 -- 134/78   Heart Rate (Admit) 53 bpm 65 bpm 90 bpm -- 65 bpm   Heart Rate (Exercise) 71 bpm 117 bpm 107 bpm -- 110 bpm   Heart Rate (Exit) 69 bpm 63 bpm 68 bpm -- 76 bpm   Oxygen  Saturation (Admit) 98 % 94 % 92 % -- 96 %   Oxygen  Saturation (Exercise) 91 % 91 % 93 % -- 91 %   Oxygen  Saturation (Exit) 91 % 96 % 95 % -- 96 %   Rating of Perceived Exertion (Exercise) 7 12 14  -- 14   Perceived Dyspnea (Exercise) 0 0 0 -- 0   Symptoms none none none -- none   Comments results First 2 weeks of exercise sessions -- -- --   Duration -- Progress to 30 minutes of  aerobic without signs/symptoms of physical distress Progress to 30 minutes of  aerobic without signs/symptoms of physical  distress Progress to 30 minutes of  aerobic without signs/symptoms of physical distress Progress to 30 minutes of  aerobic without signs/symptoms of physical distress   Intensity -- THRR unchanged THRR unchanged THRR unchanged THRR unchanged     Progression   Progression -- Continue to progress workloads to maintain intensity without signs/symptoms of physical distress. Continue to progress workloads to maintain intensity without signs/symptoms of physical distress. Continue to progress workloads to maintain intensity without signs/symptoms of physical distress. Continue to progress workloads to maintain intensity without signs/symptoms of physical distress.   Average METs -- 2.29 2.74 2.74 3.5     Resistance Training   Training Prescription -- Yes Yes Yes Yes   Weight -- 7lb 7lb 7lb 7lb   Reps -- 10-15 10-15 10-15 10-15     Interval Training   Interval Training -- No No No No     Treadmill   MPH -- 1.9 2.5 2.5 2.5   Grade -- 0 0 0 0   Minutes -- 15 15 15 15    METs -- 2.45 2.91 2.91 2.91     REL-XR   Level -- 3 5 5 6    Minutes -- 15 15 15 15    METs -- 2.9 3 3  4.7     Biostep-RELP   Level -- -- 10 10 4    Minutes -- --  15 15 15    METs -- -- 3 3 4      Home Exercise Plan   Plans to continue exercise at -- -- -- Home (comment)  walking/leg exercises Home (comment)  walking/leg exercises   Frequency -- -- -- Add 2 additional days to program exercise sessions. Add 2 additional days to program exercise sessions.   Initial Home Exercises Provided -- -- -- 11/05/23 11/05/23     Oxygen    Maintain Oxygen  Saturation -- 88% or higher 88% or higher 88% or higher 88% or higher    Row Name 11/28/23 0900 12/13/23 1600 12/25/23 1400         Response to Exercise   Blood Pressure (Admit) 124/72 112/60 100/58     Blood Pressure (Exit) 124/80 104/62 98/58     Heart Rate (Admit) 63 bpm 80 bpm 59 bpm     Heart Rate (Exercise) 88 bpm 84 bpm 96 bpm     Heart Rate (Exit) 70 bpm 82 bpm 51 bpm      Oxygen  Saturation (Admit) 97 % 99 % 95 %     Oxygen  Saturation (Exercise) 90 % 94 % 90 %     Oxygen  Saturation (Exit) 99 % 99 % 99 %     Rating of Perceived Exertion (Exercise) 15 14 15      Perceived Dyspnea (Exercise) 0 1 1     Symptoms none none none     Duration Progress to 30 minutes of  aerobic without signs/symptoms of physical distress Progress to 30 minutes of  aerobic without signs/symptoms of physical distress Progress to 30 minutes of  aerobic without signs/symptoms of physical distress     Intensity THRR unchanged THRR unchanged THRR unchanged       Progression   Progression Continue to progress workloads to maintain intensity without signs/symptoms of physical distress. Continue to progress workloads to maintain intensity without signs/symptoms of physical distress. Continue to progress workloads to maintain intensity without signs/symptoms of physical distress.     Average METs 3.54 3.54 3.44       Resistance Training   Training Prescription Yes Yes Yes     Weight 7lb 7lb 7lb     Reps 10-15 10-15 10-15       Interval Training   Interval Training No No No       Treadmill   MPH 2.5 2.7 2.7     Grade 0 2.5 0     Minutes 15 15 15      METs 2.91 4 3.07       NuStep   Level 4 -- --     Minutes 15 -- --     METs 3.8 -- --       REL-XR   Level 9 7 5      Minutes 15 15 15      METs 4.9 -- 3.5       Biostep-RELP   Level 8 6 3      Minutes -- 15 15     METs 4 3 3        Rower   Level 10 10 10      Watts 32 27 27     Minutes 15 15 15      METs 4.15 4.15 4.48       Home Exercise Plan   Plans to continue exercise at Home (comment)  walking/leg exercises Home (comment)  walking/leg exercises Home (comment)  walking/leg exercises     Frequency Add 2 additional days to program exercise  sessions. Add 2 additional days to program exercise sessions. Add 2 additional days to program exercise sessions.     Initial Home Exercises Provided 11/05/23 11/05/23 11/05/23       Oxygen     Maintain Oxygen  Saturation 88% or higher 88% or higher 88% or higher        Exercise Comments:   Exercise Comments     Row Name 10/05/23 [phone]           Exercise Comments First full day of exercise!  Patient was oriented to gym and equipment including functions, settings, policies, and procedures.  Patient's individual exercise prescription and treatment plan were reviewed.  All starting workloads were established based on the results of the 6 minute walk test done at initial orientation visit.  The plan for exercise progression was also introduced and progression will be customized based on patient's performance and goals.          Exercise Goals and Review:   Exercise Goals     Row Name 10/03/23 1019             Exercise Goals   Increase Physical Activity Yes       Intervention Provide advice, education, support and counseling about physical activity/exercise needs.;Develop an individualized exercise prescription for aerobic and resistive training based on initial evaluation findings, risk stratification, comorbidities and participant's personal goals.       Expected Outcomes Short Term: Attend rehab on a regular basis to increase amount of physical activity.;Long Term: Add in home exercise to make exercise part of routine and to increase amount of physical activity.;Long Term: Exercising regularly at least 3-5 days a week.       Increase Strength and Stamina Yes       Intervention Provide advice, education, support and counseling about physical activity/exercise needs.;Develop an individualized exercise prescription for aerobic and resistive training based on initial evaluation findings, risk stratification, comorbidities and participant's personal goals.       Expected Outcomes Short Term: Increase workloads from initial exercise prescription for resistance, speed, and METs.;Short Term: Perform resistance training exercises routinely during rehab and add in resistance training  at home;Long Term: Improve cardiorespiratory fitness, muscular endurance and strength as measured by increased METs and functional capacity ( )       Able to understand and use rate of perceived exertion (RPE) scale Yes       Intervention Provide education and explanation on how to use RPE scale       Expected Outcomes Short Term: Able to use RPE daily in rehab to express subjective intensity level;Long Term:  Able to use RPE to guide intensity level when exercising independently       Able to understand and use Dyspnea scale Yes       Intervention Provide education and explanation on how to use Dyspnea scale       Expected Outcomes Short Term: Able to use Dyspnea scale daily in rehab to express subjective sense of shortness of breath during exertion;Long Term: Able to use Dyspnea scale to guide intensity level when exercising independently       Knowledge and understanding of Target Heart Rate Range (THRR) Yes       Intervention Provide education and explanation of THRR including how the numbers were predicted and where they are located for reference       Expected Outcomes Short Term: Able to state/look up THRR;Short Term: Able to use daily as guideline for intensity in rehab;Long Term: Able  to use THRR to govern intensity when exercising independently       Able to check pulse independently Yes       Intervention Provide education and demonstration on how to check pulse in carotid and radial arteries.;Review the importance of being able to check your own pulse for safety during independent exercise       Expected Outcomes Short Term: Able to explain why pulse checking is important during independent exercise;Long Term: Able to check pulse independently and accurately       Understanding of Exercise Prescription Yes       Intervention Provide education, explanation, and written materials on patient's individual exercise prescription       Expected Outcomes Short Term: Able to explain program  exercise prescription;Long Term: Able to explain home exercise prescription to exercise independently          Exercise Goals Re-Evaluation :  Exercise Goals Re-Evaluation     Row Name 10/05/23 0853 10/17/23 1600 11/01/23 1035 11/05/23 0929 11/12/23 0951     Exercise Goal Re-Evaluation   Exercise Goals Review Increase Physical Activity;Able to understand and use rate of perceived exertion (RPE) scale;Knowledge and understanding of Target Heart Rate Range (THRR);Understanding of Exercise Prescription;Increase Strength and Stamina;Able to understand and use Dyspnea scale;Able to check pulse independently Increase Physical Activity;Understanding of Exercise Prescription;Increase Strength and Stamina Increase Physical Activity;Understanding of Exercise Prescription;Increase Strength and Stamina Increase Physical Activity;Able to understand and use rate of perceived exertion (RPE) scale;Knowledge and understanding of Target Heart Rate Range (THRR);Understanding of Exercise Prescription;Increase Strength and Stamina;Able to understand and use Dyspnea scale;Able to check pulse independently Increase Physical Activity;Increase Strength and Stamina;Understanding of Exercise Prescription   Comments Reviewed RPE and dyspnea scale, THR and program prescription with pt today.  Pt voiced understanding and was given a copy of goals to take home. Kaison is off to a good start in the program. He completed his first 2 weeks of exercise sessions in this review. He tolerated his exercise prescription well. He worked on the treadmill at a speed of 1.9 mph with no incline. He also worked at level 3 on the XR. We will continue to monitor his progress in the program. Steele is doing well in rehab. He increased to a workload on the treadmill of a speed of 2.5 mph and no incline. He also increased to level 5 on the XR and level 10 on the biostep. We will continue to monitor his progress in the program. Reviewed home exercise  with pt today from 9:15am to 9:25am.  Pt plans to walk for exercise.  Reviewed THR, pulse, RPE, sign and symptoms, pulse oximetery and when to call 911 or MD.  Also discussed weather considerations and indoor options.  Pt voiced understanding. Followed up with [name] about his home exercise. He states he is consistently doing leg extentions and dips as resistive work and is trying to walk some.   Expected Outcomes Short: Use RPE daily to regulate intensity. Long: Follow program prescription in THR. Short: Continue to follow current exercise prescription. Long: Continue exercise to improve strength and stamina. Short: Continue to progressively increase treadmill workload. Long: Continue exercise to improve strength and stamina. Short: add 1-2 days a week of exercise at home on off days of rehab. Long: maintain independent exercise routine upon graduation from pulmonary rehab. Short: continue to progress with home exercise. Long: maintain independent exercise routine upon graduation from pulmonary rehab.    Row Name 11/14/23 [phone] 11/28/23 [phone] 12/12/23 0957  12/13/23 1628 12/25/23 1441     Exercise Goal Re-Evaluation   Exercise Goals Review Increase Physical Activity;Increase Strength and Stamina;Understanding of Exercise Prescription Increase Physical Activity;Increase Strength and Stamina;Understanding of Exercise Prescription Increase Physical Activity;Increase Strength and Stamina;Understanding of Exercise Prescription Increase Physical Activity;Increase Strength and Stamina;Understanding of Exercise Prescription Increase Physical Activity;Increase Strength and Stamina;Understanding of Exercise Prescription   Comments Rodrick is doing well in rehab. He was recently able to increase from level 5 to 6 on the XR. He was also able to maintain his treadmill workload of 2. and no incline. We will continue to monitor his progress in the program. Philipp continues to do well in rehab. He was able to increase to  level 9 on the XR, level 8 on the biostep, and level 4 on the T4 nustep. He also tried the rower and was able to do level 10 and average 32 watts. We will continue to monitor his progress in the program. [name] improved his walk test by 29.1% and will be graduating soon. He has a home gym he plans use that to exercise upon graduation from rehab. Marce continues to do well in rehab. In the next review he will complete his post . In this review he increased his workload on the treadmill to a speed of 2.7 mph and incline of 2.5%. He also maintained level 10 on the rower with an average of 27 watts. We will continue to monitor his progress in the program. Emmanuelle continues to do well in rehab and will graduate soon. He improved on his post by 29.1% with a total of 1730 feet. He maintained level 10 on the rower with an average watts of 27. He maintained his speed on the treadmill at 2.7 mph, but reduced his incline on the treadmill to none. He worked at level 5 on the XR. We will continue to monitor his progress in the program until graduation.   Expected Outcomes Short: Continue to increase XR workload. Long: Continue exercise to improve strength and stamina. Short: Continue to progressively increase XR and T4 nustep workload. Long: Continue exercise to improve strength and stamina. Short: graduate. Long: maintain independent exercise routine. Short: Improve on post . Long: Continue to increase overall METs and stamina. Short: Graduate. Long: Continue to exercise independently.      Discharge Exercise Prescription (Final Exercise Prescription Changes):  Exercise Prescription Changes - 12/25/23 1400       Response to Exercise   Blood Pressure (Admit) 100/58    Blood Pressure (Exit) 98/58    Heart Rate (Admit) 59 bpm    Heart Rate (Exercise) 96 bpm    Heart Rate (Exit) 51 bpm    Oxygen  Saturation (Admit) 95 %    Oxygen  Saturation (Exercise) 90 %    Oxygen  Saturation (Exit) 99 %    Rating  of Perceived Exertion (Exercise) 15    Perceived Dyspnea (Exercise) 1    Symptoms none    Duration Progress to 30 minutes of  aerobic without signs/symptoms of physical distress    Intensity THRR unchanged      Progression   Progression Continue to progress workloads to maintain intensity without signs/symptoms of physical distress.    Average METs 3.44      Resistance Training   Training Prescription Yes    Weight 7lb    Reps 10-15      Interval Training   Interval Training No      Treadmill   MPH 2.7  Grade 0    Minutes 15    METs 3.07      REL-XR   Level 5    Minutes 15    METs 3.5      Biostep-RELP   Level 3    Minutes 15    METs 3      Rower   Level 10    Watts 27    Minutes 15    METs 4.48      Home Exercise Plan   Plans to continue exercise at Home (comment)   walking/leg exercises   Frequency Add 2 additional days to program exercise sessions.    Initial Home Exercises Provided 11/05/23      Oxygen    Maintain Oxygen  Saturation 88% or higher          Nutrition:  Target Goals: Understanding of nutrition guidelines, daily intake of sodium 1500mg , cholesterol 200mg , calories 30% from fat and 7% or less from saturated fats, daily to have 5 or more servings of fruits and vegetables.  Education: Nutrition 1 -Group instruction provided by verbal, written material, interactive activities, discussions, models, and posters to present general guidelines for heart healthy nutrition including macronutrients, label reading, and promoting whole foods over processed counterparts. Education serves as pensions consultant of discussion of heart healthy eating for all. Written material provided at class time. Flowsheet Row Pulmonary Rehab from 12/12/2023 in [facility] Cardiac and Pulmonary Rehab  Date 12/05/23  Educator jg  Instruction Review Code 1- Verbalizes Understanding      Education: Nutrition 2 -Group instruction provided by verbal, written material, interactive  activities, discussions, models, and posters to present general guidelines for heart healthy nutrition including sodium, cholesterol, and saturated fat. Providing guidance of habit forming to improve blood pressure, cholesterol, and body weight. Written material provided at class time. Flowsheet Row Pulmonary Rehab from 12/12/2023 in [facility] Cardiac and Pulmonary Rehab  Date 12/12/23  Educator jg  Instruction Review Code 1- Verbalizes Understanding      Biometrics:  Pre Biometrics - 10/03/23 1020       Pre Biometrics   Height 6' 1.1 (1.857 m)    Weight 194 lb 12.8 oz (88.4 kg)    Waist Circumference 36 inches    Hip Circumference 39 inches    Waist to Hip Ratio 0.92 %    BMI (Calculated) 25.62    Single Leg Stand 17.5 seconds          Post Biometrics - 12/12/23 0945        Post  Biometrics   Height 6' 1.1 (1.857 m)    Weight 195 lb 3.2 oz (88.5 kg)    Waist Circumference 39.2 inches    Hip Circumference 42.8 inches    Waist to Hip Ratio 0.92 %    BMI (Calculated) 25.68    Single Leg Stand 30 seconds          Nutrition Therapy Plan and Nutrition Goals:   Nutrition Assessments:  MEDIFICTS Score Key: >=70 Need to make dietary changes  40-70 Heart Healthy Diet <= 40 Therapeutic Level Cholesterol Diet  Flowsheet Row Pulmonary Rehab from 12/14/2023 in [facility] Cardiac and Pulmonary Rehab  Picture Your Plate Total Score on Admission 44  Picture Your Plate Total Score on Discharge 43   Picture Your Plate Scores: <59 Unhealthy dietary pattern with much room for improvement. 41-50 Dietary pattern unlikely to meet recommendations for good health and room for improvement. 51-60 More healthful dietary pattern, with some room for improvement.  >60  Healthy dietary pattern, although there may be some specific behaviors that could be improved.   Nutrition Goals Re-Evaluation:  Nutrition Goals Re-Evaluation     Row Name 10/22/23 9061 11/26/23 0933 12/14/23 0940          Goals   Comment Patient was informed on why it is important to maintain a balanced diet when dealing with Respiratory issues. Explained that it takes a lot of energy to breath and when they are short of breath often they will need to have a good diet to help keep up with the calories they are expending for breathing. Continues to defer RD apt. Continues to defer RD apt.     Expected Outcome Short: Choose and plan snacks accordingly to patients caloric intake to improve breathing. Long: Maintain a diet independently that meets their caloric intake to aid in daily shortness of breath. -- --        Nutrition Goals Discharge (Final Nutrition Goals Re-Evaluation):  Nutrition Goals Re-Evaluation - 12/14/23 0940       Goals   Comment Continues to defer RD apt.          Psychosocial: Target Goals: Acknowledge presence or absence of significant depression and/or stress, maximize coping skills, provide positive support system. Participant is able to verbalize types and ability to use techniques and skills needed for reducing stress and depression.   Education: Stress, Anxiety, and Depression - Group verbal and visual presentation to define topics covered.  Reviews how body is impacted by stress, anxiety, and depression.  Also discusses healthy ways to reduce stress and to treat/manage anxiety and depression.  Written material provided at class time. Flowsheet Row Pulmonary Rehab from 12/12/2023 in [facility] Cardiac and Pulmonary Rehab  Date 11/07/23  Educator kb  Instruction Review Code 1- [employer] Understanding    Education: Sleep Hygiene -Provides group verbal and written instruction about how sleep can affect your health.  Define sleep hygiene, discuss sleep cycles and impact of sleep habits. Review good sleep hygiene tips.    Initial Review & Psychosocial Screening:  Initial Psych Review & Screening - 09/28/23 1040       Initial Review   Current issues with Current Stress Concerns     Source of Stress Concerns Chronic Illness      Family Dynamics   Good Support System? Yes      Barriers   Psychosocial barriers to participate in program There are no identifiable barriers or psychosocial needs.      Screening Interventions   Interventions Encouraged to exercise;To provide support and resources with identified psychosocial needs;Provide feedback about the scores to participant    Expected Outcomes Short Term goal: Utilizing psychosocial counselor, staff and physician to assist with identification of specific Stressors or current issues interfering with healing process. Setting desired goal for each stressor or current issue identified.;Long Term Goal: Stressors or current issues are controlled or eliminated.;Short Term goal: Identification and review with participant of any Quality of Life or Depression concerns found by scoring the questionnaire.;Long Term goal: The participant improves quality of Life and PHQ9 Scores as seen by post scores and/or verbalization of changes          Quality of Life Scores:  Scores of 19 and below usually indicate a poorer quality of life in these areas.  A difference of  2-3 points is a clinically meaningful difference.  A difference of 2-3 points in the total score of the Quality of Life Index has been associated with significant  improvement in overall quality of life, self-image, physical symptoms, and general health in studies assessing change in quality of life.  PHQ-9: Review Flowsheet  More data exists      12/14/2023 10/22/2023 12/14/2022 10/31/2022 04/25/2022  Depression screen PHQ 2/9  Decreased Interest 0 0 0 2 0  Down, Depressed, Hopeless 0 0 0 0 0  PHQ - 2 Score 0 0 0 2 0  Altered sleeping 0 0 0 1 0  Tired, decreased energy 0 0 2 2 1   Change in appetite 0 0 0 2 0  Feeling bad or failure about yourself  0 0 0 0 0  Trouble concentrating 0 0 0 0 0  Moving slowly or fidgety/restless 0 0 0 0 1  Suicidal thoughts 0 0 0 0 0  PHQ-9  Score 0  0  2  7  2    Difficult doing work/chores Not difficult at all Not difficult at all Not difficult at all - -    Details       Data saved with a previous flowsheet row definition        Interpretation of Total Score  Total Score Depression Severity:  1-4 = Minimal depression, 5-9 = Mild depression, 10-14 = Moderate depression, 15-19 = Moderately severe depression, 20-27 = Severe depression   Psychosocial Evaluation and Intervention:  Psychosocial Evaluation - 09/28/23 1056       Psychosocial Evaluation & Interventions   Interventions Encouraged to exercise with the program and follow exercise prescription;Stress management education;Relaxation education    Comments Mr. Hohn is coming to pulmonary rehab. He mentions that his transplant team wants him to complete the program in preparation for a kidney transplant work up. 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 He reports his biggest stress concern is how he is going to do exercising given his long covid symptoms versus renal symptoms. He has done the program before, so he knows what to expect. He does state his stress level has been higher thinking about attending the program, but he is wanting to attend to help prepare him for transplant work up.    Expected Outcomes Short: attend pulmonary rehab for education and exercise Long: develop and maintain positive self care habits.    Continue Psychosocial Services  Follow up required by staff          Psychosocial Re-Evaluation:  Psychosocial Re-Evaluation     Row Name 10/22/23 [phone] 11/26/23 0935 12/14/23 9061         Psychosocial Re-Evaluation   Current issues with None Identified None Identified --     Comments Reviewed patient health questionnaire (PHQ-9) with patient for follow up. Previously, patients score indicated signs/symptoms of depression.  Reviewed to see if patient is improving symptom wise while in program.  Initial PHQ done  since the patient has restarteds Rehab since the last score. Swade reports that he ahs no concerns with stress, sleep, or mental health. He in fact reports that he has been sleeping better. He sleeps though the night and feels rested in the morning. Traquan has no changes in his mood and is going to continue to exercise at home. He has a good attitude about his health and is feeling better.     Expected Outcomes Short: Continue to attend [facility] regularly for regular exercise and social engagement. Long: Continue to improve symptoms and manage a positive mental state. Short: continue to attend pulmonary rehab for the mental health benefits of exercise. Long: maintain  good mental health habits. --     Interventions Encouraged to attend Pulmonary Rehabilitation for the exercise Encouraged to attend Pulmonary Rehabilitation for the exercise --     Continue Psychosocial Services  Follow up required by staff Follow up required by staff No Follow up required        Psychosocial Discharge (Final Psychosocial Re-Evaluation):  Psychosocial Re-Evaluation - 12/14/23 0938       Psychosocial Re-Evaluation   Comments Sebastian has no changes in his mood and is going to continue to exercise at home. He has a good attitude about his health and is feeling better.    Continue Psychosocial Services  No Follow up required          Education: Education Goals: Education classes will be provided on a weekly basis, covering required topics. Participant will state understanding/return demonstration of topics presented.  Learning Barriers/Preferences:  Learning Barriers/Preferences - 09/28/23 1039       Learning Barriers/Preferences   Learning Barriers Exercise Concerns    Learning Preferences None          General Pulmonary Education Topics:  Infection Prevention: - Provides verbal and written material to individual with discussion of infection control including proper hand washing and proper equipment  cleaning during exercise session. Flowsheet Row Pulmonary Rehab from 12/12/2023 in [facility] Cardiac and Pulmonary Rehab  Date 10/03/23  Educator [facility]  Instruction Review Code 1- Verbalizes Understanding    Falls Prevention: - Provides verbal and written material to individual with discussion of falls prevention and safety. Flowsheet Row Pulmonary Rehab from 12/12/2023 in [facility] Cardiac and Pulmonary Rehab  Date 10/03/23  Educator [facility]  Instruction Review Code 1- Verbalizes Understanding    Chronic Lung Disease Review: - Group verbal instruction with posters, models, PowerPoint presentations and videos,  to review new updates, new respiratory medications, new advancements in procedures and treatments. Providing information on websites and 800 numbers for continued self-education. Includes information about supplement oxygen , available portable oxygen  systems, continuous and intermittent flow rates, oxygen  safety, concentrators, and Medicare reimbursement for oxygen . Explanation of Pulmonary Drugs, including class, frequency, complications, importance of spacers, rinsing mouth after steroid MDI's, and proper cleaning methods for nebulizers. Review of basic lung anatomy and physiology related to function, structure, and complications of lung disease. Review of risk factors. Discussion about methods for diagnosing sleep apnea and types of masks and machines for OSA. Includes a review of the use of types of environmental controls: home humidity, furnaces, filters, dust mite/pet prevention, HEPA vacuums. Discussion about weather changes, air quality and the benefits of nasal washing. Instruction on Warning signs, infection symptoms, calling MD promptly, preventive modes, and value of vaccinations. Review of effective airway clearance, coughing and/or vibration techniques. Emphasizing that all should Create an Action Plan. Written material provided at class time. Flowsheet Row Pulmonary Rehab from 12/12/2023 in  [facility] Cardiac and Pulmonary Rehab  Education need identified 10/03/23  Date 10/31/23  Educator jh  Instruction Review Code 1- Verbalizes Understanding    AED/CPR: - Group verbal and written instruction with the use of models to demonstrate the basic use of the AED with the basic ABC's of resuscitation.    Tests and Procedures:  - Group verbal and visual presentation and models provide information about basic cardiac anatomy and function. Reviews the testing methods done to diagnose heart disease and the outcomes of the test results. Describes the treatment choices: Medical Management, Angioplasty, or Coronary Bypass Surgery for treating various heart conditions including Myocardial Infarction, Angina, Valve  Disease, and Cardiac Arrhythmias.  Written material provided at class time. Flowsheet Row Pulmonary Rehab from 12/12/2023 in [facility] Cardiac and Pulmonary Rehab  Date 10/10/23  Educator KB  Instruction Review Code 1- Verbalizes Understanding    Medication Safety: - Group verbal and visual instruction to review commonly prescribed medications for heart and lung disease. Reviews the medication, class of the drug, and side effects. Includes the steps to properly store meds and maintain the prescription regimen.  Written material given at graduation.   Other: -Provides group and verbal instruction on various topics (see comments)   Knowledge Questionnaire Score:  Knowledge Questionnaire Score - 10/05/23 1026       Knowledge Questionnaire Score   Pre Score 12/18           Core Components/Risk Factors/Patient Goals at Admission:  Personal Goals and Risk Factors at Admission - 09/28/23 1039       Core Components/Risk Factors/Patient Goals on Admission   Improve shortness of breath with ADL's Yes    Intervention Provide education, individualized exercise plan and daily activity instruction to help decrease symptoms of SOB with activities of daily living.    Expected Outcomes  Short Term: Improve cardiorespiratory fitness to achieve a reduction of symptoms when performing ADLs;Long Term: Be able to perform more ADLs without symptoms or delay the onset of symptoms    Heart Failure Yes    Intervention Provide a combined exercise and nutrition program that is supplemented with education, support and counseling about heart failure. Directed toward relieving symptoms such as shortness of breath, decreased exercise tolerance, and extremity edema.    Expected Outcomes Improve functional capacity of life;Short term: Attendance in program 2-3 days a week with increased exercise capacity. Reported lower sodium intake. Reported increased fruit and vegetable intake. Reports medication compliance.;Short term: Daily weights obtained and reported for increase. Utilizing diuretic protocols set by physician.;Long term: Adoption of self-care skills and reduction of barriers for early signs and symptoms recognition and intervention leading to self-care maintenance.    Hypertension Yes    Intervention Provide education on lifestyle modifcations including regular physical activity/exercise, weight management, moderate sodium restriction and increased consumption of fresh fruit, vegetables, and low fat dairy, alcohol moderation, and smoking cessation.;Monitor prescription use compliance.    Expected Outcomes Short Term: Continued assessment and intervention until BP is < 140/52mm HG in hypertensive participants. < 130/56mm HG in hypertensive participants with diabetes, heart failure or chronic kidney disease.;Long Term: Maintenance of blood pressure at goal levels.          Education:Diabetes - Individual verbal and written instruction to review signs/symptoms of diabetes, desired ranges of glucose level fasting, after meals and with exercise. Acknowledge that pre and post exercise glucose checks will be done for 3 sessions at entry of program.   Know Your Numbers and Heart Failure: - Group  verbal and visual instruction to discuss disease risk factors for cardiac and pulmonary disease and treatment options.  Reviews associated critical values for Overweight/Obesity, Hypertension, Cholesterol, and Diabetes.  Discusses basics of heart failure: signs/symptoms and treatments.  Introduces Heart Failure Zone chart for action plan for heart failure. Written material provided at class time. Flowsheet Row Pulmonary Rehab from 12/12/2023 in [facility]  Date 10/24/23  Educator KB  Instruction Review Code 1- Verbalizes Understanding    Core Components/Risk Factors/Patient Goals Review:   Goals and Risk Factor Review     Row Name 10/22/23 [phone] 11/26/23 [phone] 12/14/23 [phone]  Core Components/Risk Factors/Patient Goals Review   Personal Goals Review Improve shortness of breath with ADL's Weight Management/Obesity Other     Review Spoke to patient about their shortness of breath and what they can do to improve. Patient has been informed of breathing techniques when starting the program. Patient is informed to tell staff if they have had any med changes and that certain meds they are taking or not taking can be causing shortness of breath. Anothony consistently and independently checks weight at home to monitor fluid levels and medication. Halford is graduating [facility] and is going to continue exercise at home. 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 He has no questions about his medications or exercis prescription.     Expected Outcomes Short: Attend [facility] regularly to improve shortness of breath with ADL's. Long: maintain independence with ADL's Short: continue to weight at home and monitor for signs of HF and fluid retention. Long: control risk factors and maintain steady weight. Short: Graduate [facility]. Long: maintain exercise independently.        Core Components/Risk Factors/Patient Goals at Discharge (Final Review):   Goals and Risk Factor Review -  12/14/23 0947       Core Components/Risk Factors/Patient Goals Review   Personal Goals Review Other    Review Tami is graduating [facility] and is going to continue exercise at home. 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 He has no questions about his medications or exercis prescription.    Expected Outcomes Short: Graduate [facility]. Long: maintain exercise independently.          ITP Comments:  ITP Comments     Row Name 09/28/23 1038 10/03/23 1009 10/05/23 0853 10/10/23 1003 11/07/23 1449   ITP Comments Initial phone call completed. Diagnosis can be found in [facility] 7/25. EP Orientation scheduled for Wednesday 8/20 at 9am. Completed and gym orientation for pulmonary rehab. Initial ITP created and sent for review to Dr. Faud Aleskerov, Medical Director. First full day of exercise!  Patient was oriented to gym and equipment including functions, settings, policies, and procedures.  Patient's individual exercise prescription and treatment plan were reviewed.  All starting workloads were established based on the results of the 6 minute walk test done at initial orientation visit.  The plan for exercise progression was also introduced and progression will be customized based on patient's performance and goals. 30 Day review completed. Medical Director ITP review done; changes made as directed and signed approval by Medical Director. New to program. 30 Day review completed. Medical Director ITP review done; changes made as directed and signed approval by Medical Director.    Row Name 12/05/23 1005 12/26/23 1001         ITP Comments 30 Day review completed. Medical Director ITP review done; changes made as directed and signed approval by Medical Director. Cyan graduated today from  rehab with 36 sessions completed.  Details of the patient's exercise prescription and what He needs to do in order to continue the prescription and progress were discussed with patient.  Patient was given a copy  of prescription and goals.  Patient verbalized understanding. Marquan plans to continue to exercise by walking and doing leg exercises.         Comments: Discharge ITP

## 2023-12-26 NOTE — Progress Notes (Signed)
 Discharge Summary   Louis [name]  DOB: December 14, 1960   [name] graduated today from  rehab with 36 sessions completed.  Details of the patient's exercise prescription and what He needs to do in order to continue the prescription and progress were discussed with patient.  Patient was given a copy of prescription and goals.  Patient verbalized understanding. Gentle plans to continue to exercise by walking and doing leg exercises.   6 Minute Walk     Row Name 10/03/23 1012 12/12/23 0938       6 Minute Walk   Phase Initial Discharge    Distance 1340 feet 1730 feet    Distance % Change -- 29.1 %    Distance Feet Change -- 390 ft    Walk Time 6 minutes 6 minutes    # of Rest Breaks 0 0    MPH 2.53 3.28    METS 3.46 4.06    RPE 7 11    Perceived Dyspnea  0 0    VO2 Peak 12.1 14.21    Symptoms No No    Resting HR 53 bpm 59 bpm    Resting BP 128/70 116/80    Resting Oxygen  Saturation  98 % 95 %    Exercise Oxygen  Saturation  during 6 min walk 91 % 99 %    Max Ex. HR 71 bpm 70 bpm    Max Ex. BP 142/78 130/60    2 Minute Post BP 134/74 120/80      Interval HR   1 Minute HR 60 46  Pulse Ox had issues reading during P6MWT    2 Minute HR 71 46  Pulse Ox had issues reading during P6MWT    3 Minute HR 69 --  Pulse Ox had issues reading during P6MWT    4 Minute HR 69 --  Pulse Ox had issues reading during P6MWT    5 Minute HR -- --  Pulse Ox had issues reading during P6MWT    6 Minute HR -- 70    2 Minute Post HR -- 60    Interval Heart Rate? Yes Yes      Interval Oxygen    Interval Oxygen ? Yes Yes    Baseline Oxygen  Saturation % 98 % 95 %    1 Minute Oxygen  Saturation % 95 % 99 %  Pulse Ox had issues reading during P6MWT    1 Minute Liters of Oxygen  0 L 0 L    2 Minute Oxygen  Saturation % 94 % 99 %  Pulse Ox had issues reading during P6MWT    2 Minute Liters of Oxygen  0 L 0 L    3 Minute Oxygen  Saturation % 91 % 99 %  Pulse Ox had issues reading during P6MWT    3 Minute Liters of  Oxygen  0 L 0 L    4 Minute Oxygen  Saturation % 91 % --  Pulse Ox had issues reading during P6MWT    4 Minute Liters of Oxygen  0 L 0 L    5 Minute Oxygen  Saturation % -- --  Pulse Ox had issues reading during P6MWT    5 Minute Liters of Oxygen  0 L 0 L    6 Minute Oxygen  Saturation % -- --  Pulse Ox had issues reading during P6MWT    6 Minute Liters of Oxygen  0 L 0 L    2 Minute Post Oxygen  Saturation % -- 98 %    2 Minute Post Liters of Oxygen  0 L  0 L

## 2023-12-28 ENCOUNTER — Encounter

## 2023-12-31 ENCOUNTER — Encounter

## 2024-01-02 ENCOUNTER — Encounter

## 2024-01-14 ENCOUNTER — Inpatient Hospital Stay: Admit: 2024-01-14 | Discharge: 2024-01-15 | Payer: PRIVATE HEALTH INSURANCE

## 2024-01-14 ENCOUNTER — Inpatient Hospital Stay: Admit: 2024-01-14 | Discharge: 2024-01-14 | Payer: PRIVATE HEALTH INSURANCE

## 2024-01-14 DIAGNOSIS — N186 End stage renal disease: Principal | ICD-10-CM

## 2024-01-14 DIAGNOSIS — Z01818 Encounter for other preprocedural examination: Principal | ICD-10-CM

## 2024-01-14 DIAGNOSIS — Z01810 Encounter for preprocedural cardiovascular examination: Principal | ICD-10-CM

## 2024-01-21 ENCOUNTER — Inpatient Hospital Stay: Admit: 2024-01-21 | Discharge: 2024-01-21 | Payer: Medicaid (Managed Care)

## 2024-01-21 ENCOUNTER — Ambulatory Visit: Admit: 2024-01-21 | Payer: Medicaid (Managed Care)

## 2024-01-21 ENCOUNTER — Encounter: Admit: 2024-01-21 | Discharge: 2024-01-21 | Payer: Medicaid (Managed Care)

## 2024-01-21 ENCOUNTER — Ambulatory Visit: Admit: 2024-01-21 | Discharge: 2024-01-22 | Payer: Medicaid (Managed Care)

## 2024-02-11 ENCOUNTER — Ambulatory Visit: Admit: 2024-02-11 | Discharge: 2024-02-12 | Payer: Medicaid (Managed Care)

## 2024-02-11 DIAGNOSIS — Z01818 Encounter for other preprocedural examination: Principal | ICD-10-CM

## 2024-02-11 DIAGNOSIS — Z21 Asymptomatic human immunodeficiency virus [HIV] infection status: Principal | ICD-10-CM

## 2024-02-11 DIAGNOSIS — I10 Essential (primary) hypertension: Principal | ICD-10-CM

## 2024-02-11 DIAGNOSIS — N186 End stage renal disease: Principal | ICD-10-CM

## 2024-02-11 DIAGNOSIS — L709 Acne, unspecified: Principal | ICD-10-CM

## 2024-02-13 ENCOUNTER — Ambulatory Visit: Admitting: Podiatry

## 2024-02-13 VITALS — Ht 73.1 in | Wt 195.0 lb

## 2024-02-13 DIAGNOSIS — L84 Corns and callosities: Secondary | ICD-10-CM | POA: Diagnosis not present

## 2024-02-13 DIAGNOSIS — M2042 Other hammer toe(s) (acquired), left foot: Secondary | ICD-10-CM

## 2024-02-13 DIAGNOSIS — M21612 Bunion of left foot: Secondary | ICD-10-CM

## 2024-02-13 DIAGNOSIS — M2012 Hallux valgus (acquired), left foot: Secondary | ICD-10-CM | POA: Diagnosis not present

## 2024-02-13 NOTE — Patient Instructions (Signed)
 More silicone pads can be purchased from:  [email or web address]

## 2024-02-13 NOTE — Progress Notes (Signed)
"  °  Subjective:  Patient ID: Louis [name], male    DOB: [birth year],  MRN: [id number]  Chief Complaint  Patient presents with   Callouses    RM 3 Patient is here for callus of the left index toe.    63 y.o. male presents with the above complaint. History confirmed with patient.  Lesion can be quite painful.  He is hoping to get a renal transplant soon and wanted to get it sorted out before then.  Objective:  Physical Exam: warm, good capillary refill, no trophic changes or ulcerative lesions, normal DP and PT pulses, normal sensory exam, and left foot hallux valgus deformity with crossover second toe dorsal corn painful PIPJ second toe.  Assessment:   1. Callus of foot   2. Hammertoe of left foot   3. Hallux valgus with bunions, left      Plan:  Patient was evaluated and treated and all questions answered.  All symptomatic hyperkeratoses were safely debrided with a sterile #15 blade to patient's level of comfort without incident. We discussed preventative and palliative care of these lesions including supportive and accommodative shoegear, padding, prefabricated and custom molded accommodative orthoses, use of a pumice stone and lotions/creams daily.  He has a discussed contribution of his hallux valgus and hammertoe deformities and discussed surgical options as well if this did not improve if he is interested.  He will return to me as needed   No follow-ups on file.   "

## 2024-03-18 DIAGNOSIS — Z7682 Awaiting organ transplant status: Principal | ICD-10-CM
